# Patient Record
Sex: Male | Born: 1955
Health system: Southern US, Community
[De-identification: ages and names within clinical notes are randomized; demographics above are authoritative.]

## PROBLEM LIST (undated history)

## (undated) DIAGNOSIS — I639 Cerebral infarction, unspecified: Secondary | ICD-10-CM

## (undated) DIAGNOSIS — M1990 Unspecified osteoarthritis, unspecified site: Secondary | ICD-10-CM

## (undated) DIAGNOSIS — I69398 Other sequelae of cerebral infarction: Secondary | ICD-10-CM

## (undated) DIAGNOSIS — E785 Hyperlipidemia, unspecified: Secondary | ICD-10-CM

## (undated) DIAGNOSIS — M109 Gout, unspecified: Secondary | ICD-10-CM

## (undated) DIAGNOSIS — I10 Essential (primary) hypertension: Secondary | ICD-10-CM

## (undated) DIAGNOSIS — I63411 Cerebral infarction due to embolism of right middle cerebral artery: Secondary | ICD-10-CM

## (undated) DIAGNOSIS — I480 Paroxysmal atrial fibrillation: Secondary | ICD-10-CM

## (undated) DIAGNOSIS — N179 Acute kidney failure, unspecified: Secondary | ICD-10-CM

## (undated) DIAGNOSIS — F1094 Alcohol use, unspecified with alcohol-induced mood disorder: Secondary | ICD-10-CM

## (undated) DIAGNOSIS — R209 Unspecified disturbances of skin sensation: Secondary | ICD-10-CM

## (undated) DIAGNOSIS — Q242 Cor triatriatum: Secondary | ICD-10-CM

## (undated) DIAGNOSIS — K635 Polyp of colon: Secondary | ICD-10-CM

## (undated) HISTORY — DX: Hyperlipidemia, unspecified: E78.5

## (undated) HISTORY — DX: Cerebral infarction due to embolism of right middle cerebral artery: I63.411

## (undated) HISTORY — DX: Unspecified disturbances of skin sensation: R20.9

## (undated) HISTORY — DX: Cor triatriatum: Q24.2

## (undated) HISTORY — DX: Paroxysmal atrial fibrillation: I48.0

## (undated) HISTORY — DX: Unspecified osteoarthritis, unspecified site: M19.90

## (undated) HISTORY — PX: OTHER SURGICAL HISTORY: SHX169

## (undated) HISTORY — DX: Alcohol use, unspecified with alcohol-induced mood disorder: F10.94

## (undated) HISTORY — DX: Essential (primary) hypertension: I10

## (undated) HISTORY — DX: Acute kidney failure, unspecified: N17.9

## (undated) HISTORY — DX: Other sequelae of cerebral infarction: I69.398

## (undated) HISTORY — DX: Cerebral infarction, unspecified: I63.9

## (undated) SURGERY — EGD (ESOPHAGOGASTRODUODENOSCOPY)
Anesthesia: Moderate Sedation | Laterality: Left

---

## 2009-02-16 DIAGNOSIS — K635 Polyp of colon: Secondary | ICD-10-CM

## 2009-02-16 HISTORY — DX: Polyp of colon: K63.5

## 2009-02-16 HISTORY — PX: COLONOSCOPY: SHX174

## 2012-10-10 ENCOUNTER — Ambulatory Visit (INDEPENDENT_AMBULATORY_CARE_PROVIDER_SITE_OTHER): Payer: BC Managed Care – PPO | Admitting: Emergency Medicine

## 2012-10-10 ENCOUNTER — Telehealth: Payer: Self-pay | Admitting: Radiology

## 2012-10-10 VITALS — BP 178/92 | HR 77 | Temp 98.0°F | Resp 16 | Ht 66.25 in | Wt 180.6 lb

## 2012-10-10 DIAGNOSIS — Z0289 Encounter for other administrative examinations: Secondary | ICD-10-CM

## 2012-10-10 DIAGNOSIS — Z0000 Encounter for general adult medical examination without abnormal findings: Secondary | ICD-10-CM

## 2012-10-10 LAB — POCT UA - MICROSCOPIC ONLY
Casts, Ur, LPF, POC: NEGATIVE
Mucus, UA: NEGATIVE
WBC, Ur, HPF, POC: NEGATIVE
Yeast, UA: NEGATIVE

## 2012-10-10 LAB — LIPID PANEL
Cholesterol: 283 mg/dL — ABNORMAL HIGH (ref 0–200)
Triglycerides: 235 mg/dL — ABNORMAL HIGH (ref <150)
VLDL: 47 mg/dL — ABNORMAL HIGH (ref 0–40)

## 2012-10-10 LAB — POCT URINALYSIS DIPSTICK
Bilirubin, UA: NEGATIVE
Glucose, UA: NEGATIVE
Leukocytes, UA: NEGATIVE
Nitrite, UA: NEGATIVE
Urobilinogen, UA: 0.2

## 2012-10-10 LAB — PSA: PSA: 0.44 ng/mL (ref <=4.00)

## 2012-10-10 LAB — POCT CBC
Granulocyte percent: 68.7 %G (ref 37–80)
HCT, POC: 50.1 % (ref 43.5–53.7)
MPV: 7 fL (ref 0–99.8)
POC Granulocyte: 7 — AB (ref 2–6.9)
POC LYMPH PERCENT: 24.5 %L (ref 10–50)
POC MID %: 6.8 %M (ref 0–12)
Platelet Count, POC: 303 10*3/uL (ref 142–424)
RDW, POC: 13.2 %

## 2012-10-10 LAB — COMPREHENSIVE METABOLIC PANEL
CO2: 26 mEq/L (ref 19–32)
Calcium: 9.8 mg/dL (ref 8.4–10.5)
Creat: 0.73 mg/dL (ref 0.50–1.35)
Glucose, Bld: 101 mg/dL — ABNORMAL HIGH (ref 70–99)
Sodium: 135 mEq/L (ref 135–145)
Total Bilirubin: 0.6 mg/dL (ref 0.3–1.2)
Total Protein: 8 g/dL (ref 6.0–8.3)

## 2012-10-10 MED ORDER — LISINOPRIL 20 MG PO TABS: 20.0000 mg | ORAL_TABLET | Freq: Every day | ORAL | Status: DC

## 2012-10-10 MED ORDER — LOSARTAN POTASSIUM-HCTZ 50-12.5 MG PO TABS: 1.0000 | ORAL_TABLET | Freq: Every day | ORAL | Status: DC

## 2012-10-10 NOTE — Progress Notes (Signed)
[facility] [address], [place] [address] [phone]  Date:  10/10/2012   Name:  [name]   DOB:  [birth year]   MRN:  [id number]  PCP:  No PCP Per Patient    Chief Complaint: admin physical   History of Present Illness:  [name] is a 57 y.o. very pleasant male patient who presents with the following:  For wellness examination for insurance to verify BP and labs.  There are no active problems to display for this patient.   Past Medical History  Diagnosis Date  . Arthritis   . Hypertension     History reviewed. No pertinent past surgical history.  History  Substance Use Topics  . Smoking status: Current Every Day Smoker -- 0.50 packs/day for 40 years    Types: Cigarettes  . Smokeless tobacco: Not on file  . Alcohol Use: 10.8 oz/week    18 Cans of beer per week    Family History  Problem Relation Age of Onset  . Cancer Father     No Known Allergies  Medication list has been reviewed and updated.  No current outpatient prescriptions on file prior to visit.   No current facility-administered medications on file prior to visit.    Review of Systems:  As per HPI, otherwise negative.    Physical Examination: Filed Vitals:   10/10/12 0938  BP: 178/92  Pulse: 77  Temp: 98 F (36.7 C)  Resp: 16   Filed Vitals:   10/10/12 0938  Height: 5' 6.25" (1.683 m)  Weight: 180 lb 9.6 oz (81.92 kg)   Body mass index is 28.92 kg/(m^2). Ideal Body Weight: Weight in (lb) to have BMI = 25: 155.7  GEN: WDWN, NAD, Non-toxic, A & O x 3 HEENT: Atraumatic, Normocephalic. Neck supple. No masses, No LAD. Ears and Nose: No external deformity. CV: RRR, No M/G/R. No JVD. No thrill. No extra heart sounds. PULM: CTA B, no wheezes, crackles, rhonchi. No retractions. No resp. distress. No accessory muscle use. ABD: S, NT, ND, +BS. No rebound. No HSM. EXTR: No c/c/e NEURO Normal gait.  PSYCH: Normally interactive. Conversant. Not depressed or  anxious appearing.  Calm demeanor.    Assessment and Plan: Hypertension Labs   Signed,  Phillips Odor, MD

## 2012-10-10 NOTE — Telephone Encounter (Signed)
Done

## 2012-10-10 NOTE — Telephone Encounter (Signed)
Please change him to lisinopril 20 mg daily.  Follow up in one month for BP check  Thanks

## 2012-10-10 NOTE — Telephone Encounter (Signed)
Hyzaar not covered by insurance unless patient has tried and failed Ace inhibitor, please advise, do you want [place] to do a prior auth for the Hyzaar? Or do you want to change the medication to a generic ace inhibitor which is covered by the insurance carrier?

## 2012-10-11 MED ORDER — ATORVASTATIN CALCIUM 20 MG PO TABS: 20.0000 mg | ORAL_TABLET | Freq: Every day | ORAL | Status: DC

## 2012-10-11 NOTE — Progress Notes (Signed)
  Subjective:    Patient ID: [name], male    DOB: 1955/05/06, 57 y.o.   MRN: [id number]  HPI    Review of Systems     Objective:   Physical Exam        Assessment & Plan:

## 2012-10-11 NOTE — Addendum Note (Signed)
Addended by: Carmelina Dane on: 10/11/2012 08:44 AM   Modules accepted: Orders

## 2012-10-13 ENCOUNTER — Telehealth: Payer: Self-pay

## 2012-10-13 NOTE — Telephone Encounter (Signed)
pts wife called and states we should call the patient on his cell phone with any lab results pts cell number is [phone]

## 2013-05-21 ENCOUNTER — Emergency Department (HOSPITAL_COMMUNITY)
Admission: EM | Admit: 2013-05-21 | Discharge: 2013-05-21 | Disposition: A | Discharge status: Home / Self Care | Payer: BC Managed Care – PPO | Attending: Emergency Medicine | Admitting: Emergency Medicine

## 2013-05-21 ENCOUNTER — Encounter (HOSPITAL_COMMUNITY): Payer: Self-pay | Admitting: Emergency Medicine

## 2013-05-21 ENCOUNTER — Emergency Department (HOSPITAL_COMMUNITY): Payer: BC Managed Care – PPO

## 2013-05-21 DIAGNOSIS — Z79899 Other long term (current) drug therapy: Secondary | ICD-10-CM | POA: Insufficient documentation

## 2013-05-21 DIAGNOSIS — M542 Cervicalgia: Secondary | ICD-10-CM | POA: Insufficient documentation

## 2013-05-21 DIAGNOSIS — Z8739 Personal history of other diseases of the musculoskeletal system and connective tissue: Secondary | ICD-10-CM | POA: Insufficient documentation

## 2013-05-21 DIAGNOSIS — J029 Acute pharyngitis, unspecified: Secondary | ICD-10-CM | POA: Insufficient documentation

## 2013-05-21 DIAGNOSIS — F17200 Nicotine dependence, unspecified, uncomplicated: Secondary | ICD-10-CM | POA: Insufficient documentation

## 2013-05-21 DIAGNOSIS — I10 Essential (primary) hypertension: Secondary | ICD-10-CM | POA: Insufficient documentation

## 2013-05-21 LAB — CBC WITH DIFFERENTIAL/PLATELET
Basophils Absolute: 0 K/uL (ref 0.0–0.1)
Basophils Relative: 0 % (ref 0–1)
Eosinophils Absolute: 0.1 K/uL (ref 0.0–0.7)
Eosinophils Relative: 1 % (ref 0–5)
HCT: 41.9 % (ref 39.0–52.0)
Hemoglobin: 15.1 g/dL (ref 13.0–17.0)
Lymphocytes Relative: 21 % (ref 12–46)
Lymphs Abs: 2.4 K/uL (ref 0.7–4.0)
MCH: 33.3 pg (ref 26.0–34.0)
MCHC: 36 g/dL (ref 30.0–36.0)
MCV: 92.5 fL (ref 78.0–100.0)
Monocytes Absolute: 1.4 K/uL — ABNORMAL HIGH (ref 0.1–1.0)
Monocytes Relative: 12 % (ref 3–12)
Neutro Abs: 7.5 K/uL (ref 1.7–7.7)
Neutrophils Relative %: 66 % (ref 43–77)
Platelets: 225 K/uL (ref 150–400)
RBC: 4.53 MIL/uL (ref 4.22–5.81)
RDW: 12.6 % (ref 11.5–15.5)
WBC: 11.4 K/uL — ABNORMAL HIGH (ref 4.0–10.5)

## 2013-05-21 LAB — BASIC METABOLIC PANEL WITH GFR
BUN: 14 mg/dL (ref 6–23)
CO2: 24 meq/L (ref 19–32)
Calcium: 9.4 mg/dL (ref 8.4–10.5)
Chloride: 94 meq/L — ABNORMAL LOW (ref 96–112)
Creatinine, Ser: 0.64 mg/dL (ref 0.50–1.35)
GFR calc Af Amer: >90 mL/min
GFR calc non Af Amer: >90 mL/min
Glucose, Bld: 95 mg/dL (ref 70–99)
Potassium: 4.4 meq/L (ref 3.7–5.3)
Sodium: 133 meq/L — ABNORMAL LOW (ref 137–147)

## 2013-05-21 MED ORDER — IBUPROFEN 600 MG PO TABS: 600.0000 mg | ORAL_TABLET | Freq: Four times a day (QID) | ORAL | Status: DC | PRN

## 2013-05-21 MED ORDER — CYCLOBENZAPRINE HCL 10 MG PO TABS: 10.0000 mg | ORAL_TABLET | Freq: Two times a day (BID) | ORAL | Status: DC | PRN

## 2013-05-21 MED ORDER — KETOROLAC TROMETHAMINE 30 MG/ML IJ SOLN
30.0000 mg | Freq: Once | INTRAMUSCULAR | Status: AC
  Administered 2013-05-21: 30 mg via INTRAVENOUS
  Filled 2013-05-21: qty 1

## 2013-05-21 MED ORDER — OXYCODONE-ACETAMINOPHEN 5-325 MG PO TABS
1.0000 | ORAL_TABLET | ORAL | Status: DC | PRN
Start: 2013-05-21 — End: 2014-01-05

## 2013-05-21 MED ORDER — DIAZEPAM 5 MG/ML IJ SOLN
5.0000 mg | Freq: Once | INTRAMUSCULAR | Status: AC
  Administered 2013-05-21: 5 mg via INTRAVENOUS
  Filled 2013-05-21: qty 2

## 2013-05-21 MED ORDER — SODIUM CHLORIDE 0.9 % IV BOLUS (SEPSIS)
1000.0000 mL | Freq: Once | INTRAVENOUS | Status: AC
  Administered 2013-05-21: 1000 mL via INTRAVENOUS

## 2013-05-21 MED ORDER — IOHEXOL 300 MG/ML  SOLN
80.0000 mL | Freq: Once | INTRAMUSCULAR | Status: AC | PRN
  Administered 2013-05-21: 80 mL via INTRAVENOUS

## 2013-05-21 NOTE — ED Notes (Addendum)
Reports onset of neck pain, difficulty turning his head since Friday night. Denies injury to neck. Reports also having sore throat but is able to swallow. Airway intact at triage. Pt is hypertensive at triage, reports not taking bp meds for several days.

## 2013-05-21 NOTE — ED Provider Notes (Signed)
CSN: [id number]     Arrival date & time 05/21/13  1815 History   First MD Initiated Contact with Patient 05/21/13 2027     Chief Complaint  Patient presents with  . Neck Pain  . Sore Throat    HPI 58 yo M presents complaining of neck pain. Onset 2 days ago.  No known injury.  Gradual in onset.   Moderate in severity.  Gradually worsening.   Cramping, aching in nature.  Located to left posterolateral and right posterolateral neck.  Does not radiate.   He has trouble turning his neck side to side due to pain. He has mild sore throat.  No associated chest pain, nausea, vomiting, shortness of breath, diaphoresis, fever, chills, altered mental status, headache or other associated symptoms.  He has never had neck surgery or any chronic neck pain or neck injury.   Past Medical History  Diagnosis Date  . Arthritis   . Hypertension    History reviewed. No pertinent past surgical history. Family History  Problem Relation Age of Onset  . Cancer Father    History  Substance Use Topics  . Smoking status: Current Every Day Smoker -- 0.50 packs/day for 40 years    Types: Cigarettes  . Smokeless tobacco: Not on file  . Alcohol Use: 10.8 oz/week    18 Cans of beer per week    Review of Systems  Constitutional: Negative for fever and chills.  HENT: Positive for sore throat. Negative for congestion, dental problem, drooling, rhinorrhea, trouble swallowing and voice change.   Eyes: Negative for visual disturbance.  Respiratory: Negative for cough and shortness of breath.   Cardiovascular: Negative for chest pain and leg swelling.  Gastrointestinal: Negative for nausea, vomiting, abdominal pain and diarrhea.  Genitourinary: Negative for dysuria, urgency, frequency, flank pain and difficulty urinating.  Musculoskeletal: Positive for neck pain and neck stiffness. Negative for back pain.  Skin: Negative for rash.  Neurological: Negative for syncope, weakness, numbness and headaches.  All  other systems reviewed and are negative.     Allergies  Review of patient's allergies indicates no known allergies.  Home Medications   Current Outpatient Rx  Name  Route  Sig  Dispense  Refill  . guaifenesin (ROBITUSSIN) 100 MG/5ML syrup   Oral   Take 300 mg by mouth 3 (three) times daily as needed for cough.         Marland Kitchen ibuprofen (ADVIL,MOTRIN) 200 MG tablet   Oral   Take 400 mg by mouth every 6 (six) hours as needed for moderate pain.         . Menthol, Topical Analgesic, (PAIN RELIEVING PATCH ULTRA ST EX)   Apply externally   Apply 1 patch topically daily as needed (for pain).         Marland Kitchen atorvastatin (LIPITOR) 20 MG tablet   Oral   Take 1 tablet (20 mg total) by mouth daily.   90 tablet   1   . cyclobenzaprine (FLEXERIL) 10 MG tablet   Oral   Take 1 tablet (10 mg total) by mouth 2 (two) times daily as needed for muscle spasms.   30 tablet   0   . ibuprofen (ADVIL,MOTRIN) 600 MG tablet   Oral   Take 1 tablet (600 mg total) by mouth every 6 (six) hours as needed.   30 tablet   0   . lisinopril (PRINIVIL,ZESTRIL) 20 MG tablet   Oral   Take 1 tablet (20 mg total) by mouth daily.  30 tablet   3   . oxyCODONE-acetaminophen (PERCOCET/ROXICET) 5-325 MG per tablet   Oral   Take 1-2 tablets by mouth every 4 (four) hours as needed for severe pain.   15 tablet   0    BP 159/81  Pulse 71  Temp(Src) 98.3 F (36.8 C) (Oral)  Resp 20  Wt 180 lb (81.647 kg)  SpO2 96% Physical Exam  Nursing note and vitals reviewed. Constitutional: He is oriented to person, place, and time. He appears well-developed and well-nourished. No distress.  HENT:  Head: Normocephalic and atraumatic.  Mouth/Throat: Oropharynx is clear and moist.  Uvula midline; no pharyngeal abscess or mass; oropharynx normal to inspection.  No trismus.  Eyes: Conjunctivae and EOM are normal. Pupils are equal, round, and reactive to light. No scleral icterus.  Neck: Neck supple. No JVD present.  No  adenopathy.  Patient complains of worse pain with rotation of neck sided to side.  He is able to fully flex and extend his neck.  Tenderness to palpation over posterolateral soft tissues bilaterally.  No midline tenderness.  No tracheal tenderness.  Negative Kernig and Brudzinski.  Cardiovascular: Normal rate, regular rhythm, normal heart sounds and intact distal pulses.  Exam reveals no gallop and no friction rub.   No murmur heard. Pulmonary/Chest: Effort normal and breath sounds normal. No stridor. No respiratory distress. He has no wheezes. He has no rales.  Abdominal: Soft. Bowel sounds are normal. He exhibits no distension. There is no tenderness. There is no rebound and no guarding.  Musculoskeletal: He exhibits no edema.  Neurological: He is alert and oriented to person, place, and time. He exhibits normal muscle tone. Coordination normal.  Alert, oriented X 4, GCS 15.  CN 2-12 intact.  5/5 strength in bilateral upper and lower extremities, all major muscle groups.  Normal sensation to light touch in bilateral upper and lower extremities.  Visual fields intact to confrontation.  Normal gait.  Negative Romberg.  Normal coordination.  Normal finger to nose and heel to shin  Skin: Skin is warm and dry. No rash noted. He is not diaphoretic.  Psychiatric: He has a normal mood and affect.    ED Course  Procedures (including critical care time) Labs Review Labs Reviewed  CBC WITH DIFFERENTIAL - Abnormal; Notable for the following:    WBC 11.4 (*)    Monocytes Absolute 1.4 (*)    All other components within normal limits  BASIC METABOLIC PANEL - Abnormal; Notable for the following:    Sodium 133 (*)    Chloride 94 (*)    All other components within normal limits   Imaging: EXAM: CT NECK WITH CONTRAST IMPRESSION: No acute findings.   Moderate calcification / ossification within the prevertebral soft tissues at the C2 level without significant impression on adjacent airway.   Minimal  paraseptal in synapse disease over the lung apices with left apical pleural thickening. There is a small nodular component to the pleural thickening. Recommend follow-up noncontrast chest CT in 3 months.   EKG Interpretation None      MDM   58 year old male with history of alcohol abuse presents complaining of neck pain. Onset was about 2 days ago. No known injury. He states that he can't turn his head left or right. It feels like a severe muscle cramp. He is having some mild sore throat and difficulty swallowing.  Afebrile with hypertension and otherwise normal vitals. Nontoxic appearing. Normal neurologic exam. Range of motion at the neck with  rotation right and left limited secondary to pain. He is able to flex his neck and touch his chin to his chest. He has tenderness to palpation over posterior lateral neck musculature bilaterally. Remainder of his exam is unremarkable.  His presentation is most consistent with severe neck strain. However given his mild reported difficulty swallowing I obtained a CT scan of his neck with contrast to evaluate for retropharyngeal abscess or other deep space infection. The scan is negative. His epiglottis was normal the scan. Part of the scan is treated with a dose of IV Valium and on reevaluation he reports some improvement in pain and range of motion. At this point I do not suspect an occult infectious process. He has no headache, fever, altered metal status, or any other signs or symptoms suggestive of meningitis. Is appropriate for discharge with outpatient management. He is given a prescription for a few days worth of Percocet as well as Flexeril. He is advised against mixing these medications with alcohol. He is to follow with his primary care physician for reevaluation in 2-3 days if not improving. He's also to take NSAIDs.  Final diagnoses:  Neck pain       Toney Sang, MD 05/24/13 1318

## 2013-05-21 NOTE — ED Provider Notes (Signed)
I saw and evaluated the patient, reviewed the resident's note and I agree with the findings and plan.   EKG Interpretation None      Pt is a 58 y.o. M with history of hypertension, arthritis, alcohol abuse who presents emergency department with pain with moving his neck to the right or left that started 2 nights ago. He is also having pain with swallowing. Denies any fever. Denies any difficulty breathing. Denies any numbness, tingling or focal weakness. No bowel or bladder incontinence. No trismus or drooling. On exam, patient is status post tonsillectomy. His oropharynx is not erythematous and there is no lesions. No cervical lymphadenopathy. No meningismus the patient does have pain with turning his head to the right or left. He is neurologically intact with normal gait. Patient's labs are unremarkable other than mild leukocytosis which may be reactive. His repeat temperature is 98.1. He has no pain with flexion or extension of his neck. His CT scan shows no abnormality. His airway is patent. There is no sign of abscess or adenopathy. Suspect this is muscle spasm. We'll discharge patient with pain medication and muscle relaxers. Have given return precautions.  Layla MawKristen N Ward, DO 05/21/13 2303

## 2013-05-21 NOTE — Discharge Instructions (Signed)
Cervical Sprain A cervical sprain is an injury in the neck in which the strong, fibrous tissues (ligaments) that connect your neck bones stretch or tear. Cervical sprains can range from mild to severe. Severe cervical sprains can cause the neck vertebrae to be unstable. This can lead to damage of the spinal cord and can result in serious nervous system problems. The amount of time it takes for a cervical sprain to get better depends on the cause and extent of the injury. Most cervical sprains heal in 1 to 3 weeks. CAUSES  Severe cervical sprains may be caused by:   Contact sport injuries (such as from football, rugby, wrestling, hockey, auto racing, gymnastics, diving, martial arts, or boxing).   Motor vehicle collisions.   Whiplash injuries. This is an injury from a sudden forward-and backward whipping movement of the head and neck.  Falls.  Mild cervical sprains may be caused by:   Being in an awkward position, such as while cradling a telephone between your ear and shoulder.   Sitting in a chair that does not offer proper support.   Working at a poorly designed computer station.   Looking up or down for long periods of time.  SYMPTOMS   Pain, soreness, stiffness, or a burning sensation in the front, back, or sides of the neck. This discomfort may develop immediately after the injury or slowly, 24 hours or more after the injury.   Pain or tenderness directly in the middle of the back of the neck.   Shoulder or upper back pain.   Limited ability to move the neck.   Headache.   Dizziness.   Weakness, numbness, or tingling in the hands or arms.   Muscle spasms.   Difficulty swallowing or chewing.   Tenderness and swelling of the neck.  DIAGNOSIS  Most of the time your health care provider can diagnose a cervical sprain by taking your history and doing a physical exam. Your health care provider will ask about previous neck injuries and any known neck  problems, such as arthritis in the neck. X-rays may be taken to find out if there are any other problems, such as with the bones of the neck. Other tests, such as a CT scan or MRI, may also be needed.  TREATMENT  Treatment depends on the severity of the cervical sprain. Mild sprains can be treated with rest, keeping the neck in place (immobilization), and pain medicines. Severe cervical sprains are immediately immobilized. Further treatment is done to help with pain, muscle spasms, and other symptoms and may include:  Medicines, such as pain relievers, numbing medicines, or muscle relaxants.   Physical therapy. This may involve stretching exercises, strengthening exercises, and posture training. Exercises and improved posture can help stabilize the neck, strengthen muscles, and help stop symptoms from returning.  HOME CARE INSTRUCTIONS   Put ice on the injured area.   Put ice in a plastic bag.   Place a towel between your skin and the bag.   Leave the ice on for 15 20 minutes, 3 4 times a day.   If your injury was severe, you may have been given a cervical collar to wear. A cervical collar is a two-piece collar designed to keep your neck from moving while it heals.  Do not remove the collar unless instructed by your health care provider.  If you have long hair, keep it outside of the collar.  Ask your health care provider before making any adjustments to your collar.   Minor adjustments may be required over time to improve comfort and reduce pressure on your chin or on the back of your head.  Ifyou are allowed to remove the collar for cleaning or bathing, follow your health care provider's instructions on how to do so safely.  Keep your collar clean by wiping it with mild soap and water and drying it completely. If the collar you have been given includes removable pads, remove them every 1 2 days and hand wash them with soap and water. Allow them to air dry. They should be completely  dry before you wear them in the collar.  If you are allowed to remove the collar for cleaning and bathing, wash and dry the skin of your neck. Check your skin for irritation or sores. If you see any, tell your health care provider.  Do not drive while wearing the collar.   Only take over-the-counter or prescription medicines for pain, discomfort, or fever as directed by your health care provider.   Keep all follow-up appointments as directed by your health care provider.   Keep all physical therapy appointments as directed by your health care provider.   Make any needed adjustments to your workstation to promote good posture.   Avoid positions and activities that make your symptoms worse.   Warm up and stretch before being active to help prevent problems.  SEEK MEDICAL CARE IF:   Your pain is not controlled with medicine.   You are unable to decrease your pain medicine over time as planned.   Your activity level is not improving as expected.  SEEK IMMEDIATE MEDICAL CARE IF:   You develop any bleeding.  You develop stomach upset.  You have signs of an allergic reaction to your medicine.   Your symptoms get worse.   You develop new, unexplained symptoms.   You have numbness, tingling, weakness, or paralysis in any part of your body.  MAKE SURE YOU:   Understand these instructions.  Will watch your condition.  Will get help right away if you are not doing well or get worse. Document Released: 11/30/2006 Document Revised: 11/23/2012 Document Reviewed: 08/10/2012 [employer] Patient Information 2014 [employer].  

## 2013-05-24 ENCOUNTER — Telehealth (HOSPITAL_BASED_OUTPATIENT_CLINIC_OR_DEPARTMENT_OTHER): Payer: Self-pay | Admitting: Emergency Medicine

## 2013-10-10 ENCOUNTER — Other Ambulatory Visit: Payer: Self-pay | Admitting: Emergency Medicine

## 2013-10-11 ENCOUNTER — Ambulatory Visit (INDEPENDENT_AMBULATORY_CARE_PROVIDER_SITE_OTHER): Payer: BC Managed Care – PPO | Admitting: Emergency Medicine

## 2013-10-11 VITALS — BP 142/84 | HR 85 | Temp 97.7°F | Resp 18 | Ht 65.5 in | Wt 177.2 lb

## 2013-10-11 DIAGNOSIS — E782 Mixed hyperlipidemia: Secondary | ICD-10-CM

## 2013-10-11 DIAGNOSIS — I10 Essential (primary) hypertension: Secondary | ICD-10-CM | POA: Insufficient documentation

## 2013-10-11 DIAGNOSIS — Z0000 Encounter for general adult medical examination without abnormal findings: Secondary | ICD-10-CM

## 2013-10-11 LAB — POCT CBC
GRANULOCYTE PERCENT: 63.6 % (ref 37–80)
HEMATOCRIT: 48 % (ref 43.5–53.7)
Hemoglobin: 15.5 g/dL (ref 14.1–18.1)
Lymph, poc: 3.1 (ref 0.6–3.4)
MCH, POC: 31.5 pg — AB (ref 27–31.2)
MCHC: 32.3 g/dL (ref 31.8–35.4)
MCV: 97.4 fL — AB (ref 80–97)
MID (cbc): 0.3 (ref 0–0.9)
MPV: 6.3 fL (ref 0–99.8)
POC GRANULOCYTE: 5.9 (ref 2–6.9)
POC LYMPH PERCENT: 33.7 %L (ref 10–50)
POC MID %: 2.7 %M (ref 0–12)
Platelet Count, POC: 281 10*3/uL (ref 142–424)
RBC: 4.93 M/uL (ref 4.69–6.13)
RDW, POC: 13.2 %
WBC: 9.3 10*3/uL (ref 4.6–10.2)

## 2013-10-11 MED ORDER — ATORVASTATIN CALCIUM 40 MG PO TABS: 40.0000 mg | ORAL_TABLET | Freq: Every day | ORAL | Status: DC

## 2013-10-11 MED ORDER — LISINOPRIL 20 MG PO TABS: 20.0000 mg | ORAL_TABLET | Freq: Every day | ORAL | Status: DC

## 2013-10-11 NOTE — Patient Instructions (Signed)

## 2013-10-11 NOTE — Progress Notes (Signed)
[facility] [address], [place] [address] [phone]  Date:  10/11/2013   Name:  [name]   DOB:  [birth year]   MRN:  [id number]  PCP:  No PCP Per Patient    Chief Complaint: Annual Exam   History of Present Illness:  [name] is a 58 y.o. very pleasant male patient who presents with the following:  Put on lipitor and lisinopril a year ago.  Never picked up the lipitor and stopped the lisinopril until a week ago.  No end organ issues Smokes No improvement with over the counter medications or other home remedies. Denies other complaint or health concern today.    Patient Active Problem List   Diagnosis Date Noted  . Essential hypertension, benign 10/11/2013  . Mixed hyperlipidemia 10/11/2013    Past Medical History  Diagnosis Date  . Arthritis   . Hypertension   . Hyperlipidemia     History reviewed. No pertinent past surgical history.  History  Substance Use Topics  . Smoking status: Current Every Day Smoker -- 0.50 packs/day for 40 years    Types: Cigarettes  . Smokeless tobacco: Not on file  . Alcohol Use: 10.8 oz/week    18 Cans of beer per week    Family History  Problem Relation Age of Onset  . Cancer Father     No Known Allergies  Medication list has been reviewed and updated.  Current Outpatient Prescriptions on File Prior to Visit  Medication Sig Dispense Refill  . atorvastatin (LIPITOR) 20 MG tablet Take 1 tablet (20 mg total) by mouth daily.  90 tablet  1  . lisinopril (PRINIVIL,ZESTRIL) 20 MG tablet Take 1 tablet (20 mg total) by mouth daily.  30 tablet  3  . cyclobenzaprine (FLEXERIL) 10 MG tablet Take 1 tablet (10 mg total) by mouth 2 (two) times daily as needed for muscle spasms.  30 tablet  0  . guaifenesin (ROBITUSSIN) 100 MG/5ML syrup Take 300 mg by mouth 3 (three) times daily as needed for cough.      Marland Kitchen ibuprofen (ADVIL,MOTRIN) 200 MG tablet Take 400 mg by mouth every 6 (six) hours as needed for moderate  pain.      Marland Kitchen ibuprofen (ADVIL,MOTRIN) 600 MG tablet Take 1 tablet (600 mg total) by mouth every 6 (six) hours as needed.  30 tablet  0  . Menthol, Topical Analgesic, (PAIN RELIEVING PATCH ULTRA ST EX) Apply 1 patch topically daily as needed (for pain).      Marland Kitchen oxyCODONE-acetaminophen (PERCOCET/ROXICET) 5-325 MG per tablet Take 1-2 tablets by mouth every 4 (four) hours as needed for severe pain.  15 tablet  0   No current facility-administered medications on file prior to visit.    Review of Systems:  As per HPI, otherwise negative.    Physical Examination: Filed Vitals:   10/11/13 1600  BP: 142/84  Pulse: 85  Temp: 97.7 F (36.5 C)  Resp: 18   Filed Vitals:   10/11/13 1600  Height: 5' 5.5" (1.664 m)  Weight: 177 lb 3.2 oz (80.377 kg)   Body mass index is 29.03 kg/(m^2). Ideal Body Weight: Weight in (lb) to have BMI = 25: 152.2  GEN: WDWN, NAD, Non-toxic, A & O x 3 HEENT: Atraumatic, Normocephalic. Neck supple. No masses, No LAD. Ears and Nose: No external deformity. CV: RRR, No M/G/R. No JVD. No thrill. No extra heart sounds. PULM: CTA B, no wheezes, crackles, rhonchi. No retractions. No resp. distress.  No accessory muscle use. ABD: S, NT, ND, +BS. No rebound. No HSM. EXTR: No c/c/e NEURO Normal gait.  PSYCH: Normally interactive. Conversant. Not depressed or anxious appearing.  Calm demeanor.    Assessment and Plan: Wellness examination Hypertension Hyperlipidemia Non compliance Labs pending.   Signed,  Phillips Odor, MD

## 2013-10-12 LAB — LIPID PANEL
Cholesterol: 272 mg/dL — ABNORMAL HIGH (ref 0–200)
HDL: 43 mg/dL (ref >39)
LDL CALC: 192 mg/dL — AB (ref 0–99)
Total CHOL/HDL Ratio: 6.3 Ratio
Triglycerides: 183 mg/dL — ABNORMAL HIGH (ref <150)
VLDL: 37 mg/dL (ref 0–40)

## 2013-10-12 LAB — COMPREHENSIVE METABOLIC PANEL
ALT: 35 U/L (ref 0–53)
AST: 29 U/L (ref 0–37)
Albumin: 4.4 g/dL (ref 3.5–5.2)
Alkaline Phosphatase: 69 U/L (ref 39–117)
BILIRUBIN TOTAL: 0.8 mg/dL (ref 0.2–1.2)
BUN: 12 mg/dL (ref 6–23)
CO2: 23 meq/L (ref 19–32)
Calcium: 9.7 mg/dL (ref 8.4–10.5)
Chloride: 101 mEq/L (ref 96–112)
Creat: 0.75 mg/dL (ref 0.50–1.35)
Glucose, Bld: 91 mg/dL (ref 70–99)
Potassium: 4.3 mEq/L (ref 3.5–5.3)
SODIUM: 134 meq/L — AB (ref 135–145)
TOTAL PROTEIN: 8.3 g/dL (ref 6.0–8.3)

## 2013-10-13 LAB — PSA: PSA: 0.55 ng/mL (ref <=4.00)

## 2013-10-13 NOTE — Progress Notes (Signed)
   Subjective:    Patient ID: [name], male    DOB: 1955-08-03, 58 y.o.   MRN: [id number]  HPI    Review of Systems     Objective:   Physical Exam        Assessment & Plan:

## 2013-12-07 ENCOUNTER — Other Ambulatory Visit: Payer: Self-pay | Admitting: Emergency Medicine

## 2014-01-05 ENCOUNTER — Ambulatory Visit (INDEPENDENT_AMBULATORY_CARE_PROVIDER_SITE_OTHER): Payer: BC Managed Care – PPO | Admitting: Family Medicine

## 2014-01-05 ENCOUNTER — Encounter: Payer: Self-pay | Admitting: Family Medicine

## 2014-01-05 VITALS — BP 150/84 | HR 80 | Temp 97.6°F | Resp 16 | Ht 66.5 in | Wt 178.2 lb

## 2014-01-05 DIAGNOSIS — Z23 Encounter for immunization: Secondary | ICD-10-CM

## 2014-01-05 DIAGNOSIS — E782 Mixed hyperlipidemia: Secondary | ICD-10-CM

## 2014-01-05 DIAGNOSIS — I10 Essential (primary) hypertension: Secondary | ICD-10-CM

## 2014-01-05 DIAGNOSIS — Z1159 Encounter for screening for other viral diseases: Secondary | ICD-10-CM

## 2014-01-05 LAB — LIPID PANEL
CHOLESTEROL: 212 mg/dL — AB (ref 0–200)
HDL: 41 mg/dL (ref >39)
LDL Cholesterol: 142 mg/dL — ABNORMAL HIGH (ref 0–99)
TRIGLYCERIDES: 143 mg/dL (ref <150)
Total CHOL/HDL Ratio: 5.2 Ratio
VLDL: 29 mg/dL (ref 0–40)

## 2014-01-05 LAB — ALT: ALT: 43 U/L (ref 0–53)

## 2014-01-05 MED ORDER — LISINOPRIL 20 MG PO TABS: 20.0000 mg | ORAL_TABLET | Freq: Every day | ORAL | Status: DC

## 2014-01-05 NOTE — Patient Instructions (Signed)
Hypertension Hypertension is another name for high blood pressure. High blood pressure forces your heart to work harder to pump blood. A blood pressure reading has two numbers, which includes a higher number over a lower number (example: 110/72). HOME CARE   Have your blood pressure rechecked by your doctor.  Only take medicine as told by your doctor. Follow the directions carefully. The medicine does not work as well if you skip doses. Skipping doses also puts you at risk for problems.  Do not smoke.  Monitor your blood pressure at home as told by your doctor. GET HELP IF:  You think you are having a reaction to the medicine you are taking.  You have repeat headaches or feel dizzy.  You have puffiness (swelling) in your ankles.  You have trouble with your vision. GET HELP RIGHT AWAY IF:   You get a very bad headache and are confused.  You feel weak, numb, or faint.  You get chest or belly (abdominal) pain.  You throw up (vomit).  You cannot breathe very well. MAKE SURE YOU:   Understand these instructions.  Will watch your condition.  Will get help right away if you are not doing well or get worse. Document Released: 07/22/2007 Document Revised: 02/07/2013 Document Reviewed: 11/25/2012 [facility] Patient Information 2015 [place], [place]. This information is not intended to replace advice given to you by your health care provider. Make sure you discuss any questions you have with your health care provider.        Mediterranean Diet  Why follow it? Research shows. . Those who follow the Mediterranean diet have a reduced risk of heart disease  . The diet is associated with a reduced incidence of Parkinson's and Alzheimer's diseases . People following the diet may have longer life expectancies and lower rates of chronic diseases  . The Dietary Guidelines for Americans recommends the Mediterranean diet as an eating plan to promote health and prevent disease  What Is the  Mediterranean Diet?  . Healthy eating plan based on typical foods and recipes of Mediterranean-style cooking . The diet is primarily a plant based diet; these foods should make up a majority of meals   Starches - Plant based foods should make up a majority of meals - They are an important sources of vitamins, minerals, energy, antioxidants, and fiber - Choose whole grains, foods high in fiber and minimally processed items  - Typical grain sources include wheat, oats, barley, corn, brown rice, bulgar, farro, millet, polenta, couscous  - Various types of beans include chickpeas, lentils, fava beans, black beans, white beans   Fruits  Veggies - Large quantities of antioxidant rich fruits & veggies; 6 or more servings  - Vegetables can be eaten raw or lightly drizzled with oil and cooked  - Vegetables common to the traditional Mediterranean Diet include: artichokes, arugula, beets, broccoli, brussel sprouts, cabbage, carrots, celery, collard greens, cucumbers, eggplant, kale, leeks, lemons, lettuce, mushrooms, okra, onions, peas, peppers, potatoes, pumpkin, radishes, rutabaga, shallots, spinach, sweet potatoes, turnips, zucchini - Fruits common to the Mediterranean Diet include: apples, apricots, avocados, cherries, clementines, dates, figs, grapefruits, grapes, melons, nectarines, oranges, peaches, pears, pomegranates, strawberries, tangerines  Fats - Replace butter and margarine with healthy oils, such as olive oil, canola oil, and tahini  - Limit nuts to no more than a handful a day  - Nuts include walnuts, almonds, pecans, pistachios, pine nuts  - Limit or avoid candied, honey roasted or heavily salted nuts - Olives are central  to the Mediterranean diet - can be eaten whole or used in a variety of dishes   Meats Protein - Limiting red meat: no more than a few times a month - When eating red meat: choose lean cuts and keep the portion to the size of deck of cards - Eggs: approx. 0 to 4 times a  week  - Fish and lean poultry: at least 2 a week  - Healthy protein sources include, chicken, [place], lean beef, lamb - Increase intake of seafood such as tuna, salmon, trout, mackerel, shrimp, scallops - Avoid or limit high fat processed meats such as sausage and bacon  Dairy - Include moderate amounts of low fat dairy products  - Focus on healthy dairy such as fat free yogurt, skim milk, low or reduced fat cheese - Limit dairy products higher in fat such as whole or 2% milk, cheese, ice cream  Alcohol - Moderate amounts of red wine is ok  - No more than 5 oz daily for women (all ages) and men older than age 57  - No more than 10 oz of wine daily for men younger than 2265  Other - Limit sweets and other desserts  - Use herbs and spices instead of salt to flavor foods  - Herbs and spices common to the traditional Mediterranean Diet include: basil, bay leaves, chives, cloves, cumin, fennel, garlic, lavender, marjoram, mint, oregano, parsley, pepper, rosemary, sage, savory, sumac, tarragon, thyme   It's not just a diet, it's a lifestyle:  . The Mediterranean diet includes lifestyle factors typical of those in the region  . Foods, drinks and meals are best eaten with others and savored . Daily physical activity is important for overall good health . This could be strenuous exercise like running and aerobics . This could also be more leisurely activities such as walking, housework, yard-work, or taking the stairs . Moderation is the key; a balanced and healthy diet accommodates most foods and drinks . Consider portion sizes and frequency of consumption of certain foods   Meal Ideas & Options:  . Breakfast:  o Whole wheat toast or whole wheat English muffins with peanut butter & hard boiled egg o Steel cut oats topped with apples & cinnamon and skim milk  o Fresh fruit: banana, strawberries, melon, berries, peaches  o Smoothies: strawberries, bananas, greek yogurt, peanut butter o Low fat  greek yogurt with blueberries and granola  o Egg white omelet with spinach and mushrooms o Breakfast couscous: whole wheat couscous, apricots, skim milk, cranberries  . Sandwiches:  o Hummus and grilled vegetables (peppers, zucchini, squash) on whole wheat bread   o Grilled chicken on whole wheat pita with lettuce, tomatoes, cucumbers or tzatziki  o Tuna salad on whole wheat bread: tuna salad made with greek yogurt, olives, red peppers, capers, green onions o Garlic rosemary lamb pita: lamb sauted with garlic, rosemary, salt & pepper; add lettuce, cucumber, greek yogurt to pita - flavor with lemon juice and black pepper  . Seafood:  o Mediterranean grilled salmon, seasoned with garlic, basil, parsley, lemon juice and black pepper o Shrimp, lemon, and spinach whole-grain pasta salad made with low fat greek yogurt  o Seared scallops with lemon orzo  o Seared tuna steaks seasoned salt, pepper, coriander topped with tomato mixture of olives, tomatoes, olive oil, minced garlic, parsley, green onions and cappers  . Meats:  o Herbed greek chicken salad with kalamata olives, cucumber, feta  o Red bell peppers stuffed with spinach, bulgur,  lean ground beef (or lentils) & topped with feta   o Kebabs: skewers of chicken, tomatoes, onions, zucchini, squash  o [place]key burgers: made with red onions, mint, dill, lemon juice, feta cheese topped with roasted red peppers . Vegetarian o Cucumber salad: cucumbers, artichoke hearts, celery, red onion, feta cheese, tossed in olive oil & lemon juice  o Hummus and whole grain pita points with a greek salad (lettuce, tomato, feta, olives, cucumbers, red onion) o Lentil soup with celery, carrots made with vegetable broth, garlic, salt and pepper  o Tabouli salad: parsley, bulgur, mint, scallions, cucumbers, tomato, radishes, lemon juice, olive oil, salt and pepper. o

## 2014-01-06 LAB — HEPATITIS C ANTIBODY: HCV Ab: NEGATIVE

## 2014-01-07 ENCOUNTER — Encounter: Payer: Self-pay | Admitting: Family Medicine

## 2014-01-07 NOTE — Progress Notes (Signed)
S:  This 58 y.o. Cauc male is here to follow-up re: HTN and lipid disorder. He is compliant w/ medications and reports no side effects. He has been taking atorvastatin 20 mg, though his medication list has both 20 and 40 mg tablets listed. He states he had a large supply of 40 mg tablets so he finished taking those tablets then changed to 20 mg tablets.   Patient Active Problem List   Diagnosis Date Noted  . Essential hypertension, benign 10/11/2013  . Mixed hyperlipidemia 10/11/2013    Prior to Admission medications   Medication Sig Start Date End Date Taking? Authorizing Provider  atorvastatin (LIPITOR) 20 MG tablet TAKE 1 TABLET BY MOUTH EVERY DAY 12/07/13  Yes Carmelina DaneJeffery S Anderson, MD  ibuprofen (ADVIL,MOTRIN) 200 MG tablet Take 400 mg by mouth every 6 (six) hours as needed for moderate pain.   Yes Historical Provider, MD  lisinopril (PRINIVIL,ZESTRIL) 20 MG tablet Take 1 tablet (20 mg total) by mouth daily.   Yes Maurice MarchBarbara B Daquann Merriott, MD   [facility] and [facility]x reviewed.  ROS: negative for diaphoresis, fatigue, abnormal weight change, vision disturbances, CP or tightness, palpitations, SOB or DOE, cough, GI upset, myalgias, HA, dizziness, lightheadedness, numbness, weakness or syncope.  O: Filed Vitals:   01/05/14 1129  BP: 150/84  Pulse: 80  Temp: 97.6 F (36.4 C)  Resp: 16    GEN: In NAD; WN,WD HENT: [place]/AT; EOMI w/ clear conj/sclerae. Otherwise unremarkable. COR: RRR. LUNGS: Unlabored resp. SKIN: W&D; intact.  MS: MAEs; no deformities or muscle atrophy. NEURO: A&O x 3; CNs intact. Nonfocal.  A/P: Essential hypertension, benign- Stable on current medication. Continue same.  Mixed hyperlipidemia - Continue atorvastatin 20 mg tablet pending lab results. Plan: Lipid panel, ALT  Need for prophylactic vaccination and inoculation against influenza - Plan: Flu Vaccine QUAD 36+ mos IM  Need for hepatitis C screening test - Plan: Hepatitis C Ab Reflex HCV RNA, QUANT  Meds ordered  this encounter  Medications  . lisinopril (PRINIVIL,ZESTRIL) 20 MG tablet    Sig: Take 1 tablet (20 mg total) by mouth daily.    Dispense:  30 tablet    Refill:  5

## 2014-01-09 NOTE — Progress Notes (Signed)
Quick Note:  Please advise pt regarding following labs...  Your lipid panel/ cholesterol numbers look much better. Continue current medications. Have your pharmacy contact us when you need additional refills. Hepatitis C antibody test is negative.  [place]oy to pt. ______

## 2014-01-10 ENCOUNTER — Encounter: Payer: Self-pay | Admitting: Radiology

## 2014-03-19 ENCOUNTER — Other Ambulatory Visit: Payer: Self-pay | Admitting: Emergency Medicine

## 2014-04-13 ENCOUNTER — Ambulatory Visit: Payer: BC Managed Care – PPO | Admitting: Family Medicine

## 2014-05-10 ENCOUNTER — Telehealth: Payer: Self-pay | Admitting: *Deleted

## 2014-05-10 ENCOUNTER — Encounter: Payer: Self-pay | Admitting: Family Medicine

## 2014-05-10 ENCOUNTER — Ambulatory Visit (INDEPENDENT_AMBULATORY_CARE_PROVIDER_SITE_OTHER): Payer: BLUE CROSS/BLUE SHIELD | Admitting: Family Medicine

## 2014-05-10 VITALS — BP 150/80 | HR 85 | Temp 98.4°F | Resp 16 | Ht 66.5 in | Wt 180.4 lb

## 2014-05-10 DIAGNOSIS — M2550 Pain in unspecified joint: Secondary | ICD-10-CM | POA: Diagnosis not present

## 2014-05-10 DIAGNOSIS — E782 Mixed hyperlipidemia: Secondary | ICD-10-CM | POA: Diagnosis not present

## 2014-05-10 DIAGNOSIS — I10 Essential (primary) hypertension: Secondary | ICD-10-CM | POA: Diagnosis not present

## 2014-05-10 LAB — LIPID PANEL
Cholesterol: 200 mg/dL (ref 0–200)
HDL: 39 mg/dL — ABNORMAL LOW (ref >=40)
LDL Cholesterol: 113 mg/dL — ABNORMAL HIGH (ref 0–99)
Total CHOL/HDL Ratio: 5.1 Ratio
Triglycerides: 241 mg/dL — ABNORMAL HIGH (ref <150)
VLDL: 48 mg/dL — AB (ref 0–40)

## 2014-05-10 LAB — ALT: ALT: 33 U/L (ref 0–53)

## 2014-05-10 LAB — BASIC METABOLIC PANEL
BUN: 12 mg/dL (ref 6–23)
CALCIUM: 9.7 mg/dL (ref 8.4–10.5)
CO2: 23 mEq/L (ref 19–32)
Chloride: 98 mEq/L (ref 96–112)
Creat: 0.75 mg/dL (ref 0.50–1.35)
Glucose, Bld: 96 mg/dL (ref 70–99)
Potassium: 5.2 mEq/L (ref 3.5–5.3)
SODIUM: 133 meq/L — AB (ref 135–145)

## 2014-05-10 MED ORDER — LISINOPRIL-HYDROCHLOROTHIAZIDE 20-12.5 MG PO TABS: 1.0000 | ORAL_TABLET | Freq: Every day | ORAL | Status: DC

## 2014-05-10 MED ORDER — ATORVASTATIN CALCIUM 40 MG PO TABS: 40.0000 mg | ORAL_TABLET | Freq: Every day | ORAL | Status: DC

## 2014-05-10 NOTE — Progress Notes (Signed)
   Subjective:    Patient ID: [name], male    DOB: Jun 24, 1955, 59 y.o.   MRN: [id number]  HPI  This 59 y.o. Male has HTN; he is compliant w/ medication but takes it at different times of the day. Has BP cuff but not checking. He has occasional palpitations in the evening. Caffeine consumption- 2 cups of coffee and tea; not enough water. He walks all day at work and mows his lawn. He used to play golf but is having increased knee and hip pain (never evaluated).  Lipid disorder- takes Atorvastatin 20 mg daily but dose was 40 mg last fall. He denies myalgias, GI or cognitive problems at that dose.  Patient Active Problem List   Diagnosis Date Noted  . Essential hypertension, benign 10/11/2013  . Mixed hyperlipidemia 10/11/2013    Prior to Admission medications   Medication Sig Start Date End Date Taking? Authorizing Provider  atorvastatin (LIPITOR) 20 MG tablet TAKE 1 TABLET BY MOUTH EVERY DAY 03/20/14  Yes Maurice MarchBarbara B Anjana Cheek, MD  ibuprofen (ADVIL,MOTRIN) 200 MG tablet Take 400 mg by mouth every 6 (six) hours as needed for moderate pain.   Yes Historical Provider, MD  lisinopril (PRINIVIL,ZESTRIL) 20 MG tablet Take 1 tablet (20 mg total) by mouth daily. 01/05/14  Yes Maurice MarchBarbara B Kailea Dannemiller, MD    SOC and FAM HX reviewed.   Review of Systems  Constitutional: Negative.   Eyes: Negative for visual disturbance.  Respiratory: Negative for cough, chest tightness and shortness of breath.   Cardiovascular: Negative.   Musculoskeletal: Positive for arthralgias. Negative for myalgias, back pain, joint swelling and gait problem.  Skin: Negative.   Neurological: Negative for dizziness, syncope, weakness, numbness and headaches.       Occasional lightheadedness when BP is up.   Psychiatric/Behavioral: Negative.        Objective:   Physical Exam  Constitutional: He is oriented to person, place, and time. He appears well-developed and well-nourished. No distress.  Blood pressure 150/80, pulse  85, temperature 98.4 F (36.9 C), temperature source Oral, resp. rate 16, height 5' 6.5" (1.689 m), weight 180 lb 6.4 oz (81.829 kg), SpO2 98 %.    HENT:  Head: Normocephalic and atraumatic.  Right Ear: External ear normal.  Left Ear: External ear normal.  Mouth/Throat: Oropharynx is clear and moist.  Eyes: EOM and lids are normal. Right conjunctiva is injected. Left conjunctiva is injected. No scleral icterus.  Neck: Normal range of motion. Neck supple.  Cardiovascular: Normal rate and regular rhythm.   Pulmonary/Chest: Effort normal. No respiratory distress.  Musculoskeletal: Normal range of motion. He exhibits no edema.  Neurological: He is alert and oriented to person, place, and time. No cranial nerve deficit. Coordination normal.  Skin: Skin is warm and dry. He is not diaphoretic.  Psychiatric: He has a normal mood and affect. His behavior is normal. Judgment and thought content normal.  Nursing note and vitals reviewed.     Assessment & Plan:  Essential hypertension, benign - Inadequate control; change medication to Lisinopril- HCTZ 20-12.5 mg 1 tablet daily; advised consistent administration time. Check BP several tiems a week. Plan: Basic metabolic panel  Mixed hyperlipidemia - Resume Atorvastatin 40 mg daily. Plan: ALT, Lipid panel  Multiple joint pain - Suggested OTC supplements (Bio-Flex, Fish Oil or Flax seed oil, tumeric) Plan: Vitamin D, 25-hydroxy

## 2014-05-10 NOTE — Patient Instructions (Signed)
Osteoarthritis Osteoarthritis is a disease that causes soreness and inflammation of a joint. It occurs when the cartilage at the affected joint wears down. Cartilage acts as a cushion, covering the ends of bones where they meet to form a joint. Osteoarthritis is the most common form of arthritis. It often occurs in older people. The joints affected most often by this condition include those in the:  Ends of the fingers.  Thumbs.  Neck.  Lower back.  Knees.  Hips. CAUSES  Over time, the cartilage that covers the ends of bones begins to wear away. This causes bone to rub on bone, producing pain and stiffness in the affected joints.  RISK FACTORS Certain factors can increase your chances of having osteoarthritis, including:  Older age.  Excessive body weight.  Overuse of joints.  Previous joint injury. SIGNS AND SYMPTOMS   Pain, swelling, and stiffness in the joint.  Over time, the joint may lose its normal shape.  Small deposits of bone (osteophytes) may grow on the edges of the joint.  Bits of bone or cartilage can break off and float inside the joint space. This may cause more pain and damage. DIAGNOSIS  Your health care provider will do a physical exam and ask about your symptoms. Various tests may be ordered, such as:  X-rays of the affected joint.  An MRI scan.  Blood tests to rule out other types of arthritis.  Joint fluid tests. This involves using a needle to draw fluid from the joint and examining the fluid under a microscope. TREATMENT  Goals of treatment are to control pain and improve joint function. Treatment plans may include:  A prescribed exercise program that allows for rest and joint relief.  A weight control plan.  Pain relief techniques, such as:  Properly applied heat and cold.  Electric pulses delivered to nerve endings under the skin (transcutaneous electrical nerve stimulation [TENS]).  Massage.  Certain nutritional  supplements.  Medicines to control pain, such as:  Acetaminophen.  Nonsteroidal anti-inflammatory drugs (NSAIDs), such as naproxen.  Narcotic or central-acting agents, such as tramadol.  Corticosteroids. These can be given orally or as an injection.  Surgery to reposition the bones and relieve pain (osteotomy) or to remove loose pieces of bone and cartilage. Joint replacement may be needed in advanced states of osteoarthritis. HOME CARE INSTRUCTIONS   Take medicines only as directed by your health care provider.  Maintain a healthy weight. Follow your health care provider's instructions for weight control. This may include dietary instructions.  Exercise as directed. Your health care provider can recommend specific types of exercise. These may include:  Strengthening exercises. These are done to strengthen the muscles that support joints affected by arthritis. They can be performed with weights or with exercise bands to add resistance.  Aerobic activities. These are exercises, such as brisk walking or low-impact aerobics, that get your heart pumping.  Range-of-motion activities. These keep your joints limber.  Balance and agility exercises. These help you maintain daily living skills.  Rest your affected joints as directed by your health care provider.  Keep all follow-up visits as directed by your health care provider. SEEK MEDICAL CARE IF:   Your skin turns red.  You develop a rash in addition to your joint pain.  You have worsening joint pain.  You have a fever along with joint or muscle aches. SEEK IMMEDIATE MEDICAL CARE IF:  You have a significant loss of weight or appetite.  You have night sweats. FOR MORE  INFORMATION   [employer] and Musculoskeletal and Skin Diseases: www.niams.http://www.myers.net/nih.gov  [employer] Aging: https://walker.com/www.nia.nih.gov  American College of Rheumatology: www.rheumatology.org Document Released: 02/02/2005 Document Revised:  06/19/2013 Document Reviewed: 10/10/2012 [facility] Patient Information 2015 [place]. This information is not intended to replace advice given to you by your health care provider. Make sure you discuss any questions you have with your health care provider.    Here are some supplements that are available over-the-counter that can help reduce joint pain:  BIO-FLEX (active ingredients are glucosamine, chondroitin and MSM). You can find a generic joint formula with these ingredients.  TUMERIC capsules help reduce joint discomfort.  TART CHERRIES or a supplement of this type is helpful also. Take a FLAX SEED OIL or FISH OIL 1200 mg supplement as we discussed can be helpful foe heart health and help reduce joint pain and inflammation.   You will see Burgess Estelleod McVeigh, PA-C in 6 months for physical exam. If BP is not 140/90 or less on the new medication, schedule to see Tawanna Coolerodd and discuss what other changes need to be made. 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

## 2014-05-10 NOTE — Telephone Encounter (Signed)
Faxed signed authorization for medical records. Confirmation page received at 4:00 pm.

## 2014-05-11 LAB — VITAMIN D 25 HYDROXY (VIT D DEFICIENCY, FRACTURES): VIT D 25 HYDROXY: 20 ng/mL — AB (ref 30–100)

## 2014-05-12 NOTE — Progress Notes (Signed)
Quick Note:  Please advise pt regarding following labs...  Metabolic panel (which includes your kidney function) is good. Vitamin D level is low; get an over-the-counter Vitamin D 5000 units and take it daily. Try to get some sun exposure for 10- 15 minutes most days of the week; this helps your body use the Vitamin D for a healthier metabolism. Eat more foods like salmon, sardines, and other cold water fish, mushrooms and some dairy products.  Your cholesterol/ lipid profile shows a low HDL and high triglycerides. Triglycerides are a different form of fat in the blood that come from eating too much processed foods, "junk food" snacks and pastries. This all increases your risk for heart disease. Keep taking your cholesterol medication (atorvastatin 40 mg every night). As we discussed, you should have been on 40 mg instead of 20 mg. Your labs will be rechecked when you return to see Raelyn Ensignodd McVeigh, PA-C in Sept 2016. Focus on making healthy food choices and getting more active (exercise for fun!).   Mail a copy to pt. ______

## 2014-05-14 ENCOUNTER — Encounter: Payer: Self-pay | Admitting: Family Medicine

## 2014-07-18 DIAGNOSIS — Q242 Cor triatriatum: Secondary | ICD-10-CM

## 2014-07-18 HISTORY — DX: Cor triatriatum: Q24.2

## 2014-07-25 ENCOUNTER — Inpatient Hospital Stay (HOSPITAL_COMMUNITY)
Admission: EM | Admit: 2014-07-25 | Discharge: 2014-07-27 | DRG: 065 | Disposition: A | Discharge status: Rehabilitation Facility | Payer: BLUE CROSS/BLUE SHIELD | Attending: Family Medicine | Admitting: Family Medicine

## 2014-07-25 ENCOUNTER — Encounter (HOSPITAL_COMMUNITY): Payer: Self-pay | Admitting: *Deleted

## 2014-07-25 ENCOUNTER — Emergency Department (HOSPITAL_COMMUNITY): Payer: BLUE CROSS/BLUE SHIELD

## 2014-07-25 DIAGNOSIS — I6521 Occlusion and stenosis of right carotid artery: Secondary | ICD-10-CM | POA: Diagnosis present

## 2014-07-25 DIAGNOSIS — Z791 Long term (current) use of non-steroidal anti-inflammatories (NSAID): Secondary | ICD-10-CM | POA: Diagnosis not present

## 2014-07-25 DIAGNOSIS — Z720 Tobacco use: Secondary | ICD-10-CM | POA: Diagnosis not present

## 2014-07-25 DIAGNOSIS — F1010 Alcohol abuse, uncomplicated: Secondary | ICD-10-CM | POA: Diagnosis present

## 2014-07-25 DIAGNOSIS — I63411 Cerebral infarction due to embolism of right middle cerebral artery: Principal | ICD-10-CM | POA: Diagnosis present

## 2014-07-25 DIAGNOSIS — I63131 Cerebral infarction due to embolism of right carotid artery: Secondary | ICD-10-CM | POA: Diagnosis not present

## 2014-07-25 DIAGNOSIS — F17210 Nicotine dependence, cigarettes, uncomplicated: Secondary | ICD-10-CM | POA: Diagnosis present

## 2014-07-25 DIAGNOSIS — Z79899 Other long term (current) drug therapy: Secondary | ICD-10-CM

## 2014-07-25 DIAGNOSIS — R471 Dysarthria and anarthria: Secondary | ICD-10-CM | POA: Diagnosis present

## 2014-07-25 DIAGNOSIS — Z7902 Long term (current) use of antithrombotics/antiplatelets: Secondary | ICD-10-CM | POA: Diagnosis not present

## 2014-07-25 DIAGNOSIS — I639 Cerebral infarction, unspecified: Secondary | ICD-10-CM | POA: Diagnosis present

## 2014-07-25 DIAGNOSIS — F1011 Alcohol abuse, in remission: Secondary | ICD-10-CM | POA: Insufficient documentation

## 2014-07-25 DIAGNOSIS — Z7982 Long term (current) use of aspirin: Secondary | ICD-10-CM | POA: Diagnosis not present

## 2014-07-25 DIAGNOSIS — E785 Hyperlipidemia, unspecified: Secondary | ICD-10-CM | POA: Diagnosis not present

## 2014-07-25 DIAGNOSIS — E871 Hypo-osmolality and hyponatremia: Secondary | ICD-10-CM | POA: Diagnosis present

## 2014-07-25 DIAGNOSIS — I10 Essential (primary) hypertension: Secondary | ICD-10-CM | POA: Diagnosis present

## 2014-07-25 DIAGNOSIS — I6350 Cerebral infarction due to unspecified occlusion or stenosis of unspecified cerebral artery: Secondary | ICD-10-CM | POA: Diagnosis not present

## 2014-07-25 DIAGNOSIS — Z8249 Family history of ischemic heart disease and other diseases of the circulatory system: Secondary | ICD-10-CM | POA: Diagnosis not present

## 2014-07-25 DIAGNOSIS — G459 Transient cerebral ischemic attack, unspecified: Secondary | ICD-10-CM | POA: Diagnosis not present

## 2014-07-25 DIAGNOSIS — E782 Mixed hyperlipidemia: Secondary | ICD-10-CM | POA: Diagnosis present

## 2014-07-25 DIAGNOSIS — I63239 Cerebral infarction due to unspecified occlusion or stenosis of unspecified carotid arteries: Secondary | ICD-10-CM | POA: Insufficient documentation

## 2014-07-25 HISTORY — DX: Cerebral infarction, unspecified: I63.9

## 2014-07-25 LAB — COMPREHENSIVE METABOLIC PANEL
ALBUMIN: 3.9 g/dL (ref 3.5–5.0)
ALT: 33 U/L (ref 17–63)
ANION GAP: 8 (ref 5–15)
AST: 26 U/L (ref 15–41)
Alkaline Phosphatase: 62 U/L (ref 38–126)
BILIRUBIN TOTAL: 0.6 mg/dL (ref 0.3–1.2)
BUN: 19 mg/dL (ref 6–20)
CHLORIDE: 101 mmol/L (ref 101–111)
CO2: 24 mmol/L (ref 22–32)
Calcium: 9.7 mg/dL (ref 8.9–10.3)
Creatinine, Ser: 0.95 mg/dL (ref 0.61–1.24)
GFR calc Af Amer: >60 mL/min (ref >60)
GFR calc non Af Amer: >60 mL/min (ref >60)
Glucose, Bld: 87 mg/dL (ref 65–99)
Potassium: 3.9 mmol/L (ref 3.5–5.1)
Sodium: 133 mmol/L — ABNORMAL LOW (ref 135–145)
TOTAL PROTEIN: 7.4 g/dL (ref 6.5–8.1)

## 2014-07-25 LAB — DIFFERENTIAL
BASOS ABS: 0.1 10*3/uL (ref 0.0–0.1)
Basophils Relative: 1 % (ref 0–1)
EOS ABS: 0.2 10*3/uL (ref 0.0–0.7)
EOS PCT: 2 % (ref 0–5)
LYMPHS ABS: 3.5 10*3/uL (ref 0.7–4.0)
LYMPHS PCT: 28 % (ref 12–46)
Monocytes Absolute: 1.1 10*3/uL — ABNORMAL HIGH (ref 0.1–1.0)
Monocytes Relative: 9 % (ref 3–12)
NEUTROS ABS: 7.8 10*3/uL — AB (ref 1.7–7.7)
NEUTROS PCT: 60 % (ref 43–77)

## 2014-07-25 LAB — I-STAT TROPONIN, ED: Troponin i, poc: 0 ng/mL (ref 0.00–0.08)

## 2014-07-25 LAB — CBC
HCT: 42.4 % (ref 39.0–52.0)
HEMOGLOBIN: 14.8 g/dL (ref 13.0–17.0)
MCH: 32.3 pg (ref 26.0–34.0)
MCHC: 34.9 g/dL (ref 30.0–36.0)
MCV: 92.6 fL (ref 78.0–100.0)
Platelets: 283 10*3/uL (ref 150–400)
RBC: 4.58 MIL/uL (ref 4.22–5.81)
RDW: 12.3 % (ref 11.5–15.5)
WBC: 12.7 10*3/uL — AB (ref 4.0–10.5)

## 2014-07-25 LAB — APTT: APTT: 35 s (ref 24–37)

## 2014-07-25 LAB — PROTIME-INR
INR: 0.98 (ref 0.00–1.49)
PROTHROMBIN TIME: 13.2 s (ref 11.6–15.2)

## 2014-07-25 LAB — I-STAT CHEM 8, ED
BUN: 22 mg/dL — ABNORMAL HIGH (ref 6–20)
CREATININE: 0.9 mg/dL (ref 0.61–1.24)
Calcium, Ion: 1.15 mmol/L (ref 1.12–1.23)
Chloride: 102 mmol/L (ref 101–111)
Glucose, Bld: 89 mg/dL (ref 65–99)
HCT: 45 % (ref 39.0–52.0)
Hemoglobin: 15.3 g/dL (ref 13.0–17.0)
Potassium: 4 mmol/L (ref 3.5–5.1)
Sodium: 134 mmol/L — ABNORMAL LOW (ref 135–145)
TCO2: 21 mmol/L (ref 0–100)

## 2014-07-25 LAB — CBG MONITORING, ED: Glucose-Capillary: 86 mg/dL (ref 65–99)

## 2014-07-25 MED ORDER — ASPIRIN 300 MG RE SUPP: 300.0000 mg | Freq: Every day | RECTAL | Status: DC

## 2014-07-25 MED ORDER — SODIUM CHLORIDE 0.9 % IV SOLN
INTRAVENOUS | Status: DC
  Administered 2014-07-25 – 2014-07-27 (×3): via INTRAVENOUS

## 2014-07-25 MED ORDER — ASPIRIN 325 MG PO TABS
325.0000 mg | ORAL_TABLET | Freq: Every day | ORAL | Status: DC
  Administered 2014-07-26: 325 mg via ORAL
  Filled 2014-07-25: qty 1

## 2014-07-25 MED ORDER — FOLIC ACID 1 MG PO TABS
1.0000 mg | ORAL_TABLET | Freq: Every day | ORAL | Status: DC
  Administered 2014-07-25 – 2014-07-26 (×2): 1 mg via ORAL
  Filled 2014-07-25 (×2): qty 1

## 2014-07-25 MED ORDER — ENOXAPARIN SODIUM 40 MG/0.4ML ~~LOC~~ SOLN
40.0000 mg | SUBCUTANEOUS | Status: DC
Start: 2014-07-25 — End: 2014-07-27
  Administered 2014-07-25 – 2014-07-26 (×2): 40 mg via SUBCUTANEOUS
  Filled 2014-07-25 (×3): qty 0.4

## 2014-07-25 MED ORDER — LORAZEPAM 2 MG/ML IJ SOLN: 1.0000 mg | Freq: Four times a day (QID) | INTRAMUSCULAR | Status: DC | PRN

## 2014-07-25 MED ORDER — THIAMINE HCL 100 MG/ML IJ SOLN: 100.0000 mg | Freq: Every day | INTRAMUSCULAR | Status: DC

## 2014-07-25 MED ORDER — STROKE: EARLY STAGES OF RECOVERY BOOK: Freq: Once | Status: DC

## 2014-07-25 MED ORDER — VITAMIN B-1 100 MG PO TABS
100.0000 mg | ORAL_TABLET | Freq: Every day | ORAL | Status: DC
  Administered 2014-07-25 – 2014-07-26 (×2): 100 mg via ORAL
  Filled 2014-07-25 (×2): qty 1

## 2014-07-25 MED ORDER — ADULT MULTIVITAMIN W/MINERALS CH
1.0000 | ORAL_TABLET | Freq: Every day | ORAL | Status: DC
  Administered 2014-07-25 – 2014-07-26 (×2): 1 via ORAL
  Filled 2014-07-25 (×2): qty 1

## 2014-07-25 MED ORDER — LORAZEPAM 1 MG PO TABS
1.0000 mg | ORAL_TABLET | Freq: Four times a day (QID) | ORAL | Status: DC | PRN
  Administered 2014-07-26: 1 mg via ORAL
  Filled 2014-07-25: qty 1

## 2014-07-25 NOTE — Progress Notes (Signed)
Pt is admitted to [facility] 10 from ED. Pt is AO X 4. Admission vital signs are stable

## 2014-07-25 NOTE — Code Documentation (Signed)
59[facility].  Patient with LKW at 1100 per patient's wife.  Patient's wife left the house and returned to find her husband with slurred speech.  Patient presenting to ED with left facial droop and dysarthria.  Code stroke activated.  Patient to CT.  Stroke team to the bedside.  NIHSS 2, see documentation for details and code stroke times.  Patient with mild left facial droop and left face decreased sensation on exam.  Dr. Thad Rangereynolds at the bedside discussing plan of care with patient's wife.  Patient is outside the window for treatment with tPA and is not a candidate for endovascular intervention at this time.  Bedside handoff with ED RN Minerva AreolaEric.

## 2014-07-25 NOTE — H&P (Signed)
Family Medicine Teaching [facility] Admission History and Physical Service Pager: [phone]  Patient name: [name] Medical record number: [id number] Date of birth: 1955-10-13 Age: 59 y.o. Gender: male  Primary Care Provider: Dow Adolph, MD Consultants: neurology (consulted in ED) Code Status: Full   Chief Complaint: dysarthria, facial droop   Assessment and Plan: Tauren Delbuono is a 59 y.o. male presenting with slurred speech/dysarthria with R frontal cortex CVA . PMH is significant for HTN, HLD, tobacco and alcohol abuse.   Anterior R MCA infarct: P/w sudden onset L facial droop since ~1pm. Symptoms improving since being in ED, so not a TPA candidate. Neurology consulted by ED. CT shows infarct of anterior R MCA with no associated hemorrhage or mass effect and confirmed on MRI. WBC elevated to 12.7 2/2 stress reaction. - Admit to FPTS, telemetry, attending Gwendolyn Grant - Risk strat labs: A1c, lipid panel pending - PT/OT/SLP consults - Echocardiogram, carotid dopplers ordered - Aspirin  daily - NPO pending RN stroke swallow screen - telemetry monitoring - frequent neuro checks  Alcohol abuse: Patient reports drinking 6-18 beers a day. - monitor on CIWA protocol  HTN: On lisinopril-HCTZ 20-12.5mg  daily at home. BP normotensive to mildly elevated in ED - permissive HTN O/N - holding home antihypertensives currently.  HLD - Continue home atorvastatin  daily when able to take PO - lipid panel  Hyponatremia: Na 134 on admission. - Continue to monitor - IVF  FEN/GI: NPO pending RN stroke swallow screen, NS @ 189mL/hr Prophylaxis: lovenox  Disposition: Admit to tele, FPTS, attending Gwendolyn Grant  History of Present Illness: [name] is a 59 y.o. male presenting with CVA.  Pt states he was in his normal state of health this AM but started performing some yardwork this PM.  He started having facial droop and dysarthria early this PM along with trouble driving.  He  proceeded to pull off the side of the road where he called his wife.  At that point, they drove to the ED.  He denied any CP, blurred vision, abdominal pain, SOB, paresthesias, weakness in his extremities.  He does smoke about 1 ppd for the previous 30 years and drinks about 6-18 12 oz beers per day.  Denies illicit drug use.    In the ED, pt had multiple evaluations including CMP with Na of 133, CT head showing anterior MCA cortical based infarct.  He was made NPO and started on fluids with neurology consult.    Review Of Systems: Per HPI   Patient Active Problem List   Diagnosis Date Noted  . Essential hypertension, benign 10/11/2013  . Mixed hyperlipidemia 10/11/2013   Past Medical History: Past Medical History  Diagnosis Date  . Arthritis   . Hypertension   . Hyperlipidemia    Past Surgical History: History reviewed. No pertinent past surgical history. Social History: History  Substance Use Topics  . Smoking status: Current Every Day Smoker -- 0.50 packs/day for 40 years    Types: Cigarettes  . Smokeless tobacco: Not on file  . Alcohol Use: 10.8 oz/week    18 Cans of beer per week   Family History: Family History  Problem Relation Age of Onset  . Cancer Father   . Heart attack Brother    Allergies and Medications: No Known Allergies No current facility-administered medications on file prior to encounter.   Current Outpatient Prescriptions on File Prior to Encounter  Medication Sig Dispense Refill  . atorvastatin (LIPITOR) 40 MG tablet Take 1 tablet (40 mg total)  by mouth daily. 30 tablet 5  . ibuprofen (ADVIL,MOTRIN) 200 MG tablet Take 400 mg by mouth every 6 (six) hours as needed for moderate pain.    Marland Kitchen. lisinopril-hydrochlorothiazide (PRINZIDE,ZESTORETIC) 20-12.5 MG per tablet Take 1 tablet by mouth daily. 30 tablet 5    Objective: BP 133/79 mmHg  Pulse 71  Temp(Src) 99.1 F (37.3 C) (Oral)  Resp 15  Ht 5\' 6"  (1.676 m)  Wt 169 lb 12.1 oz (77.001 kg)  BMI  27.41 kg/m2  SpO2 97% Exam: General: NAD HEENT: [place]/AT, MMM, PERRLA, EOMI B/L  Cardiovascular: RRR, no murmurs appreciated  Respiratory: CTAB, normal WOB Abdomen: S/NT/ND, NABS Extremities: No edema, WWP  Skin: No rashes  Neuro: AAO x3, speech slightly dysarthric but fluent, slight L facial drop, CN otherwise intact, strength and sensation grossly intact.  Labs and Imaging: CBC BMET   Recent Labs Lab 07/25/14 1531 07/25/14 1537  WBC 12.7*  --   HGB 14.8 15.3  HCT 42.4 45.0  PLT 283  --     Recent Labs Lab 07/25/14 1531 07/25/14 1537  NA 133* 134*  K 3.9 4.0  CL 101 102  CO2 24  --   BUN 19 22*  CREATININE 0.95 0.90  GLUCOSE 87 89  CALCIUM 9.7  --       MRI (6/8) IMPRESSION: 1. Acute nonhemorrhagic infarct of the right frontal operculum and anterior portion of the insular cortex. 2. Acute punctate infarct in the high right parietal lobe near the vertex. 3. Slow or occluded flow in the right internal carotid artery to the level of the posterior communicating artery. 4. Age advanced periventricular T2 changes likely reflect the sequela of chronic microvascular ischemia.   Ct Head (brain) Wo Contrast (6/8):  IMPRESSION: 1. Anterior division right MCA territory cortically based infarct. ASPECTS score = 8. 2. No associated hemorrhage or mass effect.   Erasmo DownerAngela M Bacigalupo, MD 07/25/2014, 5:01 PM PGY-1, [facility] FPTS Intern pager: [phone], text pages welcome   I have seen and evaluated the pt with Dr. Beryle FlockBacigalupo.  CVA of R frontal cortex, CVA w/u for underlying etiology vs 2/2 to tobacco/HTN/HLD.  Twana FirstBryan R. Hess PGY 3 [facility]  07/25/2014, 7:05 PM

## 2014-07-25 NOTE — ED Notes (Signed)
Patient transported to MRI 

## 2014-07-25 NOTE — ED Notes (Signed)
Pt arrived at triage with c/o L sided facial droop and slurred speech.  LKW 2pm.  NS notified of Code Stroke Activation.  Pt taken to bridge for EDP airway clearance by Tammy SoursGreg, RN.

## 2014-07-25 NOTE — Progress Notes (Signed)
Pts mother called me into the room; [name] had developed dysarthria with his speech......called rapid response nurse who called Dr. Elige Koamello to notify him of patients status change.  Completed NIH scale on patient and noticed some bilateral peripheral vision loss as well.  Dr. Elige Koamello called after reviewing the MRI and said we were not going to call a new code stroke since symptoms were the same as what he presented with earlier.  Will continue to monitor patient for any other changes that may occur.

## 2014-07-25 NOTE — Consult Note (Addendum)
Referring Physician: Rhunette Croft    Chief Complaint: Code stroke  HPI:                                                                                                                                         [name] is an 59 y.o. male male presenting to the ED as a code stroke. Per wife he was last seen normal at 1100 this am.  She left to go to the store and when she returned his brother confronted her and stated he was having a stroke. His symptoms at that time was left facial droop and slurred speech.  EMS was called and the patient was brought in as a code stroke.  Initial NIHSS of 2.   Wife reports that patient was not himself all day.  He laid around more than usual and was not as active as usual.    Date last known well: Date: 07/25/2014 Time last known well: Time: 11:00 tPA Given: No: symptoms resolving Modified Rankin: Rankin Score=0   Past Medical History  Diagnosis Date  . Arthritis   . Hypertension   . Hyperlipidemia     History reviewed. No pertinent past surgical history.  Family History  Problem Relation Age of Onset  . Cancer Father   . Heart attack Brother    Social History:  reports that he has been smoking Cigarettes.  He has a 20 pack-year smoking history. He does not have any smokeless tobacco history on file. He reports that he drinks about 6-18 drinks per day. He reports that he does not use illicit drugs.  Allergies: No Known Allergies  Medications:                                                                                                                           No current facility-administered medications for this encounter.   Current Outpatient Prescriptions  Medication Sig Dispense Refill  . atorvastatin (LIPITOR) 40 MG tablet Take 1 tablet (40 mg total) by mouth daily. 30 tablet 5  . ibuprofen (ADVIL,MOTRIN) 200 MG tablet Take 400 mg by mouth every 6 (six) hours as needed for moderate pain.    Marland Kitchen lisinopril-hydrochlorothiazide  (PRINZIDE,ZESTORETIC) 20-12.5 MG per tablet Take 1 tablet by mouth daily. 30 tablet 5     ROS:  History obtained from the patient  General ROS: negative for - chills, fatigue, fever, night sweats, weight gain or weight loss Psychological ROS: negative for - behavioral disorder, hallucinations, memory difficulties, mood swings or suicidal ideation Ophthalmic ROS: negative for - blurry vision, double vision, eye pain or loss of vision ENT ROS: negative for - epistaxis, nasal discharge, oral lesions, sore throat, tinnitus or vertigo Allergy and Immunology ROS: negative for - hives or itchy/watery eyes Hematological and Lymphatic ROS: negative for - bleeding problems, bruising or swollen lymph nodes Endocrine ROS: negative for - galactorrhea, hair pattern changes, polydipsia/polyuria or temperature intolerance Respiratory ROS: negative for - cough, hemoptysis, shortness of breath or wheezing Cardiovascular ROS: negative for - chest pain, dyspnea on exertion, edema or irregular heartbeat Gastrointestinal ROS: negative for - abdominal pain, diarrhea, hematemesis, nausea/vomiting or stool incontinence Genito-Urinary ROS: negative for - dysuria, hematuria, incontinence or urinary frequency/urgency Musculoskeletal ROS: negative for - joint swelling or muscular weakness Neurological ROS: as noted in HPI Dermatological ROS: negative for rash and skin lesion changes  Neurologic Examination:                                                                                                      Blood pressure 159/77, pulse 77, temperature 99.1 F (37.3 C), temperature source Oral, resp. rate 15, height 5\' 6"  (1.676 m), weight 77.001 kg (169 lb 12.1 oz), SpO2 97 %.  HEENT-  Normocephalic, no lesions, without obvious abnormality.  Normal external eye and conjunctiva.  Normal  TM's bilaterally.  Normal auditory canals and external ears. Normal external nose, mucus membranes and septum.  Normal pharynx. Cardiovascular- S1, S2 normal, pulses palpable throughout   Lungs- chest clear, no wheezing, rales, normal symmetric air entry Abdomen- normal findings: bowel sounds normal Extremities- no edema Lymph-no adenopathy palpable Musculoskeletal-no joint tenderness, deformity or swelling Skin-warm and dry, no hyperpigmentation, vitiligo, or suspicious lesions  Neurological Examination Mental Status: Alert, oriented, thought content appropriate.  Affect flat. Speech fluent without evidence of aphasia.  Able to follow 3 step commands without difficulty. Cranial Nerves: II: Discs flat bilaterally; Visual fields grossly normal, pupils equal, round, reactive to light and accommodation III,IV, VI: ptosis not present, extra-ocular motions intact bilaterally V,VII: smile asymmetric on the left, facial light touch sensation decreased on the left VIII: hearing normal bilaterally IX,X: uvula rises symmetrically XI: bilateral shoulder shrug XII: midline tongue extension Motor: Right : Upper extremity   5/5    Left:     Upper extremity   5/5  Lower extremity   5/5     Lower extremity   5/5 Tone and bulk:normal tone throughout; no atrophy noted Sensory: Pinprick and light touch intact throughout, bilaterally Deep Tendon Reflexes: 2+ and symmetric throughout UE and 1+ in KJ. No AJ Plantars: Right: downgoing   Left: downgoing Cerebellar: normal finger-to-nose and normal heel-to-shin test Gait: not tested due to multiple leads.     Lab Results: Basic Metabolic Panel:  Recent Labs Lab 07/25/14 1531 07/25/14 1537  NA 133* 134*  K 3.9 4.0  CL 101 102  CO2  24  --   GLUCOSE 87 89  BUN 19 22*  CREATININE 0.95 0.90  CALCIUM 9.7  --     Liver Function Tests:  Recent Labs Lab 07/25/14 1531  AST 26  ALT 33  ALKPHOS 62  BILITOT 0.6  PROT 7.4  ALBUMIN 3.9   No  results for input(s): LIPASE, AMYLASE in the last 168 hours. No results for input(s): AMMONIA in the last 168 hours.  CBC:  Recent Labs Lab 07/25/14 1531 07/25/14 1537  WBC 12.7*  --   NEUTROABS 7.8*  --   HGB 14.8 15.3  HCT 42.4 45.0  MCV 92.6  --   PLT 283  --     Cardiac Enzymes: No results for input(s): CKTOTAL, CKMB, CKMBINDEX, TROPONINI in the last 168 hours.  Lipid Panel: No results for input(s): CHOL, TRIG, HDL, CHOLHDL, VLDL, LDLCALC in the last 168 hours.  CBG:  Recent Labs Lab 07/25/14 1525  GLUCAP 86    Microbiology: No results found for this or any previous visit.  Coagulation Studies:  Recent Labs  07/25/14 1531  LABPROT 13.2  INR 0.98    Imaging: Ct Head (brain) Wo Contrast  07/25/2014   ADDENDUM REPORT: 07/25/2014 15:54  ADDENDUM: Study discussed by telephone with Dr. Thana Farr on 07/25/2014 at 1538 hours.   Electronically Signed   By: Odessa Fleming M.D.   On: 07/25/2014 15:54   07/25/2014   CLINICAL DATA:  59 year old male code stroke. Slurred speech since 1400 hours. Initial encounter.  EXAM: CT HEAD WITHOUT CONTRAST  TECHNIQUE: Contiguous axial images were obtained from the base of the skull through the vertex without intravenous contrast.  COMPARISON:  Neck CT 05/21/2013.  FINDINGS: Minor paranasal sinus mucosal thickening. No acute osseous abnormality identified. Visualized orbit soft tissues are within normal limits. Visualized scalp soft tissues are within normal limits.  Calcified atherosclerosis at the skull base. No suspicious intracranial vascular hyperdensity.  However, there is abnormal gray and white matter hypodensity at the anterior right operculum (series 2, image 16). No associated hemorrhage or mass effect. ASPECTS score = 8.  Gray-white matter differentiation elsewhere is within normal limits. Incidental mega cisterna magna/right posterior fossa arachnoid cyst. No ventriculomegaly. No acute intracranial hemorrhage identified. No midline  shift, mass effect, or evidence of intracranial mass lesion.  IMPRESSION: 1. Anterior division right MCA territory cortically based infarct. ASPECTS score = 8. 2. No associated hemorrhage or mass effect.  Electronically Signed: By: Odessa Fleming M.D. On: 07/25/2014 15:32    Antwain Caliendo PA-C [facility] [phone]  07/25/2014, 4:19 PM   Patient seen and examined.  Clinical course and management discussed.  Necessary edits performed.  I agree with the above.  Assessment and plan of care developed and discussed below.    Assessment: 59 y.o. male presenting with left facial droop and numbness, slurred speech.  Symptoms resolved to some degree by presentation to the ED.  Patient outside of the window for tPA.  Initial NIHSS of 2.  Head CT personally reviewed and shows an area of hypodensity in the right frontal lobe.  Patient on no antiplatelet therapy at home.     Stroke Risk Factors - hyperlipidemia, hypertension and smoking   Recommendations: 1. HgbA1c, fasting lipid panel 2. MRI, MRA  of the brain without contrast 3. PT consult, OT consult, Speech consult 4. Echocardiogram 5. Carotid dopplers 6. Prophylactic therapy-Antiplatelet med: Aspirin - dose 325mg  daily 7. NPO until RN stroke swallow screen 8. Telemetry monitoring 9. Frequent  neuro checks  Case discussed with Dr. Carver Fila, MD [facility] [phone]  07/25/2014  4:28 PM

## 2014-07-25 NOTE — Progress Notes (Addendum)
Interviewed patient, OP notes/chart review after he was interviewed revealed patient belongs to urgent care [place]. ER physician notified and appropriate teaching service to be notified of need to admit patient. Important history obtained: patient does report issues with chronic left hip numbness radiating to knee associated with walking with onset prior to neurological symptoms today. He also admits to drinking 6-18 beers per day. I did notify Dr. Thad Rangereynolds with neurology of the alcohol history. She agrees appropriate to initiate CIWA protocol in this patient. Patient also reports had previously been smoking one pack of cigarettes per day and is currently enrolled in a program at work to stop.  Junious SilkAllison Ellis, ANP

## 2014-07-25 NOTE — ED Notes (Signed)
Family at bedside. 

## 2014-07-25 NOTE — ED Notes (Signed)
Stroke team at beside

## 2014-07-25 NOTE — ED Provider Notes (Signed)
CSN: [id number]     Arrival date & time 07/25/14  1510 History   First MD Initiated Contact with Patient 07/25/14 1517     Chief Complaint  Patient presents with  . Code Stroke     (Consider location/radiation/quality/duration/timing/severity/associated sxs/prior Treatment) HPI Comments: Pt comes in with cc of L sided drooping. Hx of HTN, alcohol abuse. He reports that around 3:30 he started having facial droop, L side and numbness to the L face and LUE. Pt here with his wife, who reports that the sx have improved. CODE stroke activated. No headaches, no trauma.   ROS 10 Systems reviewed and are negative for acute change except as noted in the HPI.     The history is provided by the patient.    Past Medical History  Diagnosis Date  . Arthritis   . Hypertension   . Hyperlipidemia    History reviewed. No pertinent past surgical history. Family History  Problem Relation Age of Onset  . Cancer Father   . Heart attack Brother    History  Substance Use Topics  . Smoking status: Current Every Day Smoker -- 0.50 packs/day for 40 years    Types: Cigarettes  . Smokeless tobacco: Not on file  . Alcohol Use: 10.8 oz/week    18 Cans of beer per week    Review of Systems  Neurological: Positive for weakness and numbness.  All other systems reviewed and are negative.     Allergies  Review of patient's allergies indicates no known allergies.  Home Medications   Prior to Admission medications   Medication Sig Start Date End Date Taking? Authorizing Provider  atorvastatin (LIPITOR) 40 MG tablet Take 1 tablet (40 mg total) by mouth daily. 05/10/14  Yes Maurice March, MD  ibuprofen (ADVIL,MOTRIN) 200 MG tablet Take 400 mg by mouth every 6 (six) hours as needed for moderate pain.   Yes Historical Provider, MD  lisinopril-hydrochlorothiazide (PRINZIDE,ZESTORETIC) 20-12.5 MG per tablet Take 1 tablet by mouth daily. 05/10/14  Yes Maurice March, MD   BP 133/79 mmHg   Pulse 71  Temp(Src) 99.1 F (37.3 C) (Oral)  Resp 15  Ht  (1.676 m)  Wt 169 lb 12.1 oz (77.001 kg)  BMI 27.41 kg/m2  SpO2 97% Physical Exam  Constitutional: He is oriented to person, place, and time. He appears well-developed and well-nourished.  HENT:  Head: Normocephalic and atraumatic.  Eyes: EOM are normal. Pupils are equal, round, and reactive to light.  Neck: Normal range of motion. Neck supple. No JVD present.  Cardiovascular: Normal rate and regular rhythm.   Pulmonary/Chest: Effort normal and breath sounds normal. No respiratory distress. He has no wheezes.  Abdominal: Soft. Bowel sounds are normal. He exhibits no distension. There is no tenderness. There is no rebound and no guarding.  Neurological: He is alert and oriented to person, place, and time.  Mild droop L side and L sided numbness - NIHSS is 2.   Skin: Skin is warm and dry.  Nursing note reviewed.   ED Course  Procedures (including critical care time) Labs Review Labs Reviewed  CBC - Abnormal; Notable for the following:    WBC 12.7 (*)    All other components within normal limits  DIFFERENTIAL - Abnormal; Notable for the following:    Neutro Abs 7.8 (*)    Monocytes Absolute 1.1 (*)    All other components within normal limits  COMPREHENSIVE METABOLIC PANEL - Abnormal; Notable for the following:  Sodium 133 (*)    All other components within normal limits  I-STAT CHEM 8, ED - Abnormal; Notable for the following:    Sodium 134 (*)    BUN 22 (*)    All other components within normal limits  PROTIME-INR  APTT  I-STAT TROPOININ, ED  CBG MONITORING, ED    Imaging Review Ct Head (brain) Wo Contrast  07/25/2014   ADDENDUM REPORT: 07/25/2014 15:54  ADDENDUM: Study discussed by telephone with Dr. Thana FarrLeslie Reynolds on 07/25/2014 at 1538 hours.   Electronically Signed   By: Odessa FlemingH  Hall M.D.   On: 07/25/2014 15:54   07/25/2014   CLINICAL DATA:  59 year old male code stroke. Slurred speech since 1400 hours.  Initial encounter.  EXAM: CT HEAD WITHOUT CONTRAST  TECHNIQUE: Contiguous axial images were obtained from the base of the skull through the vertex without intravenous contrast.  COMPARISON:  Neck CT 05/21/2013.  FINDINGS: Minor paranasal sinus mucosal thickening. No acute osseous abnormality identified. Visualized orbit soft tissues are within normal limits. Visualized scalp soft tissues are within normal limits.  Calcified atherosclerosis at the skull base. No suspicious intracranial vascular hyperdensity.  However, there is abnormal gray and white matter hypodensity at the anterior right operculum (series 2, image 16). No associated hemorrhage or mass effect. ASPECTS score = 8.  Gray-white matter differentiation elsewhere is within normal limits. Incidental mega cisterna magna/right posterior fossa arachnoid cyst. No ventriculomegaly. No acute intracranial hemorrhage identified. No midline shift, mass effect, or evidence of intracranial mass lesion.  IMPRESSION: 1. Anterior division right MCA territory cortically based infarct. ASPECTS score = 8. 2. No associated hemorrhage or mass effect.  Electronically Signed: By: Odessa FlemingH  Hall M.D. On: 07/25/2014 15:32     EKG Interpretation None      MDM   Final diagnoses:  Acute ischemic stroke    Pt with L sided weakness and facial droop. NIHSS is 2 Suspect stroke vs. TIA. Not a TPA candidate given the im;rovement in the sx and minimal symptoms. Will admit.    Derwood KaplanAnkit Labrisha Wuellner, MD 07/26/14 0201

## 2014-07-26 ENCOUNTER — Inpatient Hospital Stay (HOSPITAL_COMMUNITY): Payer: BLUE CROSS/BLUE SHIELD

## 2014-07-26 ENCOUNTER — Encounter (HOSPITAL_COMMUNITY): Payer: Self-pay | Admitting: *Deleted

## 2014-07-26 DIAGNOSIS — I10 Essential (primary) hypertension: Secondary | ICD-10-CM

## 2014-07-26 DIAGNOSIS — G459 Transient cerebral ischemic attack, unspecified: Secondary | ICD-10-CM

## 2014-07-26 DIAGNOSIS — I63411 Cerebral infarction due to embolism of right middle cerebral artery: Principal | ICD-10-CM

## 2014-07-26 DIAGNOSIS — Z720 Tobacco use: Secondary | ICD-10-CM

## 2014-07-26 DIAGNOSIS — I639 Cerebral infarction, unspecified: Secondary | ICD-10-CM

## 2014-07-26 DIAGNOSIS — E785 Hyperlipidemia, unspecified: Secondary | ICD-10-CM

## 2014-07-26 LAB — GLUCOSE, CAPILLARY
GLUCOSE-CAPILLARY: 94 mg/dL (ref 65–99)
GLUCOSE-CAPILLARY: 85 mg/dL (ref 65–99)
GLUCOSE-CAPILLARY: 111 mg/dL — AB (ref 65–99)
GLUCOSE-CAPILLARY: 130 mg/dL — AB (ref 65–99)
Glucose-Capillary: 150 mg/dL — ABNORMAL HIGH (ref 65–99)
Glucose-Capillary: 104 mg/dL — ABNORMAL HIGH (ref 65–99)

## 2014-07-26 LAB — LIPID PANEL
CHOL/HDL RATIO: 5.6 ratio
Cholesterol: 192 mg/dL (ref 0–200)
HDL: 34 mg/dL — ABNORMAL LOW (ref >40)
LDL CALC: 97 mg/dL (ref 0–99)
Triglycerides: 305 mg/dL — ABNORMAL HIGH (ref <150)
VLDL: 61 mg/dL — AB (ref 0–40)

## 2014-07-26 LAB — RAPID URINE DRUG SCREEN, HOSP PERFORMED
Amphetamines: NOT DETECTED
BARBITURATES: NOT DETECTED
Benzodiazepines: NOT DETECTED
COCAINE: NOT DETECTED
Opiates: NOT DETECTED
Tetrahydrocannabinol: NOT DETECTED

## 2014-07-26 MED ORDER — IOHEXOL 350 MG/ML SOLN
50.0000 mL | Freq: Once | INTRAVENOUS | Status: AC | PRN
  Administered 2014-07-26: 50 mL via INTRAVENOUS

## 2014-07-26 MED ORDER — PERFLUTREN LIPID MICROSPHERE
1.0000 mL | INTRAVENOUS | Status: AC | PRN
  Administered 2014-07-26: 3 mL via INTRAVENOUS
  Filled 2014-07-26: qty 10

## 2014-07-26 MED ORDER — NICOTINE 21 MG/24HR TD PT24
21.0000 mg | MEDICATED_PATCH | Freq: Every day | TRANSDERMAL | Status: DC
  Administered 2014-07-26 – 2014-07-27 (×2): 21 mg via TRANSDERMAL
  Filled 2014-07-26 (×2): qty 1

## 2014-07-26 MED ORDER — ATORVASTATIN CALCIUM 40 MG PO TABS
40.0000 mg | ORAL_TABLET | Freq: Every day | ORAL | Status: DC
  Administered 2014-07-26: 40 mg via ORAL
  Filled 2014-07-26: qty 1

## 2014-07-26 NOTE — Progress Notes (Signed)
Family Medicine Teaching Service Daily Progress Note Intern Pager: [phone]  Patient name: [name] Medical record number: [id number] Date of birth: 17-Mar-1955 Age: 59 y.o. Gender: male  Primary Care Provider: Dow Adolph, MD Consultants: neurology Code Status: full code  Pt Overview and Major Events to Date:  6/8- admit to FPTS for R MCA Stroke  Assessment and Plan: [name] is a 59 y.o. male presenting with slurred speech/dysarthria with R frontal cortex CVA . PMH is significant for HTN, HLD, tobacco and alcohol abuse.   Anterior R MCA infarct: P/w sudden onset L facial droop since ~1pm. Symptoms improving since being in ED, so not a TPA candidate. Neurology consulted by ED. CT shows infarct of anterior R MCA with no associated hemorrhage or mass effect and confirmed on MRI along with infarcts to insular cortex and parietal lobe. WBC elevated to 12.7 2/2 stress reaction. - Risk strat labs: A1c pending, lipid panel with LDL 97 - PT/OT/SLP consults - Echocardiogram, carotid dopplers ordered - CTA head/neck ordered - Aspirin  daily - telemetry monitoring - frequent neuro checks - neurology consulting, appreciate recs  Alcohol abuse: Patient reports drinking 6-18 beers a day. - monitor on CIWA protocol - not recorded, but has not required ativan - SW c/s for cessation counseling  HTN: On lisinopril-HCTZ 20-12.5mg  daily at home. BP normotensive. - Allowed permissive HTN O/N - holding home antihypertensives currently.  HLD - Continue home atorvastatin  daily - lipid panel shows LDL 97  Hyponatremia: Na 134 on admission. - Continue to monitor  Tobacco Abuse: Smokes 1PPD x30 yrs - Encourage cessation  FEN/GI: Heart healthy diet, SLIV Prophylaxis: lovenox  Disposition: floor status, d/c pending completion of stroke work-up  Subjective:  More dysarthria and vision loss overnight.  Neurology reported this was consistent with stroke location and previous  symtpoms. Patient feels like this has slightly improved today but still notices some poor peripheral vision (which he thinks may be baseline) and slight dysarthria  Objective: Temp:  [97.5 F (36.4 C)-99.1 F (37.3 C)] 97.5 F (36.4 C) (06/09 0600) Pulse Rate:  [55-82] 66 (06/09 0600) Resp:  [15-23] 18 (06/09 0600) BP: (112-177)/(53-92) 112/66 mmHg (06/09 0600) SpO2:  [94 %-98 %] 97 % (06/09 0600) Weight:  [169 lb 12.1 oz (77.001 kg)] 169 lb 12.1 oz (77.001 kg) (06/08 1529) Physical Exam:  General: NAD, laying comfortably in bed HEENT: [place]/AT, MMM, PERRLA, EOMI B/L  Cardiovascular: RRR, no murmurs appreciated  Respiratory: CTAB, normal WOB Abdomen: S/NT/ND, NABS Extremities: No edema, WWP  Skin: No rashes  Neuro: AAO x3, speech slightly dysarthric but fluent, slight L facial drop, CN otherwise intact, strength and sensation grossly intact, FNF intact  Laboratory:  Recent Labs Lab 07/25/14 1531 07/25/14 1537  WBC 12.7*  --   HGB 14.8 15.3  HCT 42.4 45.0  PLT 283  --     Recent Labs Lab 07/25/14 1531 07/25/14 1537  NA 133* 134*  K 3.9 4.0  CL 101 102  CO2 24  --   BUN 19 22*  CREATININE 0.95 0.90  CALCIUM 9.7  --   PROT 7.4  --   BILITOT 0.6  --   ALKPHOS 62  --   ALT 33  --   AST 26  --   GLUCOSE 87 89    Lipid Panel     Component Value Date/Time   CHOL 192 07/26/2014 0437   TRIG 305* 07/26/2014 0437   HDL 34* 07/26/2014 0437   CHOLHDL 5.6 07/26/2014 0437  VLDL 61* 07/26/2014 0437   LDLCALC 97 07/26/2014 0437    Imaging/Diagnostic Tests: MRI (6/8) IMPRESSION: 1. Acute nonhemorrhagic infarct of the right frontal operculum and anterior portion of the insular cortex. 2. Acute punctate infarct in the high right parietal lobe near the vertex. 3. Slow or occluded flow in the right internal carotid artery to the level of the posterior communicating artery. 4. Age advanced periventricular T2 changes likely reflect the sequela of chronic  microvascular ischemia.   Ct Head (brain) Wo Contrast (6/8): IMPRESSION: 1. Anterior division right MCA territory cortically based infarct. ASPECTS score = 8. 2. No associated hemorrhage or mass effect.   Erasmo Downer, MD 07/26/2014, 7:34 AM PGY-1, [facility] FPTS Intern pager: [phone], text pages welcome

## 2014-07-26 NOTE — Discharge Summary (Signed)
Family Medicine Teaching [facility] Discharge Summary  Patient name: [name] Medical record number: [id number] Date of birth: 08-Aug-1955 Age: 59 y.o. Gender: male Date of Admission: 07/25/2014  Date of Discharge: 07/27/2014  Admitting Physician: Tobey Grim, MD  Primary Care Provider: Dow Adolph, MD Consultants: neurology, cardiology, EP  Indication for Hospitalization: R MCA Stroke  Discharge Diagnoses/Problem List:  Acute embolic stroke  R ICA occlusion  Alcohol abuse HTN HLD Hyponatremia Tobacco abuse  Disposition: home  Discharge Condition: stable/improved  Discharge Exam: See progress note from day of discharge  Brief Hospital Course:   [name] is a 59 y.o. male with PMH of HTN, HLD, alcohol abuse, and tobacco abuse p/w L sided facial droop and dysarthria.  Code Stroke was called upon arrival to ED.  CT head showed infarct of anterior R MCA with no associated hemorrhage or mass effect. Neurology was consulted by ED and followed throughout hospitalization.  MRI brain confirmed infarct in R frontal lobe along with infarcts to R insular cortex and R parietal lobe.  CTA of head and neck showed right ICA occlusion. Transthoracic echocardiogram and transesophageal echocardiogram showed no intracardiac source of embolism. Patient was monitored on telemetry throughout hospitalization with no events.  Suspect embolic source was right ICA occlusion but cannot fully rule out cardioembolic source, so Loop recorder was placed prior to discharge monitor for atrial fibrillation.  Patient allowed permissive HTN during acute period, but restarted on home lisinopril HCTZ on discharge. Was initially treated with aspirin 325 mg daily but then transition aspirin 81 mg daily and Plavix 75 mg daily which were continued on discharge. Risk stratification labs showed A1c of 6.0 lipid panel with LDL of 97 during admission. Home atorvastatin 40 mg daily is continued throughout admission.  Neurology, cardiology, in electrophysiology follow throughout admission and helped with workup as above.  Patient was counseled about tobacco and alcohol cessation during admission. As he reported drinking 6-18 beers daily, he was monitored on CIWA protocol during admission, but did not exhibit any symptoms of withdrawal. All other chronic medical conditions were stable throughout admission and managed with home regimens.  Issues for Follow Up:  - f/u BP - ongoing tobacco and alcohol cessation counseling - patient reported depression and would like to discuss management  Significant Procedures: Loop recorder implantation  Significant Labs and Imaging:   Recent Labs Lab 07/25/14 1531 07/25/14 1537  WBC 12.7*  --   HGB 14.8 15.3  HCT 42.4 45.0  PLT 283  --     Recent Labs Lab 07/25/14 1531 07/25/14 1537  NA 133* 134*  K 3.9 4.0  CL 101 102  CO2 24  --   GLUCOSE 87 89  BUN 19 22*  CREATININE 0.95 0.90  CALCIUM 9.7  --   ALKPHOS 62  --   AST 26  --   ALT 33  --   ALBUMIN 3.9  --     Lipid Panel     Component Value Date/Time   CHOL 192 07/26/2014 0437   TRIG 305* 07/26/2014 0437   HDL 34* 07/26/2014 0437   CHOLHDL 5.6 07/26/2014 0437   VLDL 61* 07/26/2014 0437   LDLCALC 97 07/26/2014 0437    A1c 6.0  MRI (6/8) IMPRESSION: 1. Acute nonhemorrhagic infarct of the right frontal operculum and anterior portion of the insular cortex. 2. Acute punctate infarct in the high right parietal lobe near the vertex. 3. Slow or occluded flow in the right internal carotid artery to the level  of the posterior communicating artery. 4. Age advanced periventricular T2 changes likely reflect the sequela of chronic microvascular ischemia.   Ct Head (brain) Wo Contrast (6/8): IMPRESSION: 1. Anterior division right MCA territory cortically based infarct. ASPECTS score = 8. 2. No associated hemorrhage or mass effect.   Ct Angio Head/Neck W/cm &/or Wo Cm (6/9): IMPRESSION: 1. Right  ICA is occluded at its origin with no reconstituted flow in the neck. Moderate to poor reconstitution of the distal right ICA siphon. Right MCA origin and M1 segment are patent. Single right M2 branch occlusion identified just beyond the bifurcation. 2. No other major circle of Willis branch occlusion. Stenosis versus non dominance of the right ACA A1 segment. 3. Left ICA origin and bulb atherosclerosis without hemodynamically significant stenosis. Up to moderate left vertebral artery V1 segment stenosis. 4. Stable CT appearance of the brain since 07/25/2014. 5. Poor dentition.    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     atorvastatin 40 MG tablet  Commonly known as:  LIPITOR  Take 1 tablet (40 mg total) by mouth daily.     clopidogrel 75 MG tablet  Commonly known as:  PLAVIX  Take 1 tablet (75 mg total) by mouth daily.     ibuprofen 200 MG tablet  Commonly known as:  ADVIL,MOTRIN  Take 400 mg by mouth every 6 (six) hours as needed for moderate pain.     lisinopril-hydrochlorothiazide 20-12.5 MG per tablet  Commonly known as:  PRINZIDE,ZESTORETIC  Take 1 tablet by mouth daily.        Discharge Instructions: Please refer to Patient Instructions section of EMR for full details.  Patient was counseled important signs and symptoms that should prompt return to medical care, changes in medications,  dietary instructions, activity restrictions, and follow up appointments.   Follow-Up Appointments: Follow-up Information    Follow up with [facility] On 08/06/2014.   Why:  at [facility] for wound check appointment   Contact information:   [address] [place] [address]       Follow up with Xu,Jindong, MD In 2 months.   Specialty:  Neurology   Why:  Stroke Clinic, Office will call you with appointment date & time   Contact information:   [address] [place] [address] [phone]       Schedule an appointment as soon as possible for a visit with Dow Adolph, MD.   Specialty:  Family Medicine   Why:  For hospital follow-up   Contact information:   [address] [place] [address] [phone]       Erasmo Downer, MD 07/27/2014, 8:03 PM PGY-1, [facility]

## 2014-07-26 NOTE — Progress Notes (Signed)
STROKE TEAM PROGRESS NOTE   HISTORY [name] is an 59 y.o. male male presenting to the ED as a code stroke. Per wife he was last seen normal at 1100 this am (LKW 07/25/2014). She left to go to the store and when she returned his brother confronted her and stated he was having a stroke. His symptoms at that time was left facial droop and slurred speech. EMS was called and the patient was brought in as a code stroke. Initial NIHSS of 2. Wife reports that patient was not himself all day. He laid around more than usual and was not as active as usual. Modified Rankin: Rankin Score=0. Patient was not administered TPA secondary to symptoms resolving. He was admitted for further evaluation and treatment.   SUBJECTIVE (INTERVAL HISTORY) His wife and daughter are at the bedside.  Overall he feels his condition is stable.    OBJECTIVE Temp:  [97.5 F (36.4 C)-99.1 F (37.3 C)] 97.8 F (36.6 C) (06/09 1000) Pulse Rate:  [55-82] 62 (06/09 1000) Cardiac Rhythm:  [-] Normal sinus rhythm (06/09 0800) Resp:  [15-23] 20 (06/09 1000) BP: (112-177)/(53-92) 119/62 mmHg (06/09 1000) SpO2:  [94 %-98 %] 98 % (06/09 1000) Weight:  [77.001 kg (169 lb 12.1 oz)] 77.001 kg (169 lb 12.1 oz) (06/08 1529)   Recent Labs Lab 07/25/14 1525 07/25/14 2133 07/26/14 0624 07/26/14 0752 07/26/14 1246  GLUCAP 86 150* 94 104* 85    Recent Labs Lab 07/25/14 1531 07/25/14 1537  NA 133* 134*  K 3.9 4.0  CL 101 102  CO2 24  --   GLUCOSE 87 89  BUN 19 22*  CREATININE 0.95 0.90  CALCIUM 9.7  --     Recent Labs Lab 07/25/14 1531  AST 26  ALT 33  ALKPHOS 62  BILITOT 0.6  PROT 7.4  ALBUMIN 3.9    Recent Labs Lab 07/25/14 1531 07/25/14 1537  WBC 12.7*  --   NEUTROABS 7.8*  --   HGB 14.8 15.3  HCT 42.4 45.0  MCV 92.6  --   PLT 283  --    No results for input(s): CKTOTAL, CKMB, CKMBINDEX, TROPONINI in the last 168 hours.  Recent Labs  07/25/14 1531  LABPROT 13.2  INR 0.98   No results  for input(s): COLORURINE, LABSPEC, PHURINE, GLUCOSEU, HGBUR, BILIRUBINUR, KETONESUR, PROTEINUR, UROBILINOGEN, NITRITE, LEUKOCYTESUR in the last 72 hours.  Invalid input(s): APPERANCEUR     Component Value Date/Time   CHOL 192 07/26/2014 0437   TRIG 305* 07/26/2014 0437   HDL 34* 07/26/2014 0437   CHOLHDL 5.6 07/26/2014 0437   VLDL 61* 07/26/2014 0437   LDLCALC 97 07/26/2014 0437   No results found for: HGBA1C No results found for: LABOPIA, COCAINSCRNUR, LABBENZ, AMPHETMU, THCU, LABBARB  No results for input(s): ETH in the last 168 hours.  Ct Head (brain) Wo Contrast 07/25/2014    1. Anterior division right MCA territory cortically based infarct. ASPECTS score = 8. 2. No associated hemorrhage or mass effect.    Mr Brain Wo Contrast 07/25/2014    1. Acute nonhemorrhagic infarct of the right frontal operculum and anterior portion of the insular cortex. 2. Acute punctate infarct in the high right parietal lobe near the vertex. 3. Slow or occluded flow in the right internal carotid artery to the level of the posterior communicating artery. 4. Age advanced periventricular T2 changes likely reflect the sequela of chronic microvascular ischemia.   CTA Head & Neck   1. Right ICA is occluded  at its origin with no reconstituted flow in the neck. Moderate to poor reconstitution of the distal right ICA siphon. Right MCA origin and M1 segment are patent. Single right M2 branch occlusion identified just beyond the bifurcation. 2. No other major circle of Willis branch occlusion. Stenosis versus non dominance of the right ACA A1 segment. 3. Left ICA origin and bulb atherosclerosis without hemodynamically significant stenosis. Up to moderate left vertebral artery V1 segment stenosis. 4. Stable CT appearance of the brain since 07/25/2014. 5. Poor dentition.  LE Venous dopplers No DVT  2D Echocardiogram   - Left ventricle: The cavity size was normal. Systolic function was vigorous. The estimated  ejection fraction was in the range of 65%to 70%. Wall motion was normal; there were no regional wallmotion abnormalities. Doppler parameters are consistent with abnormal left ventricular relaxation (grade 1 diastolicdysfunction). There was no evidence of elevated ventricularfilling pressure by Doppler parameters. - Aortic root: The aortic root was normal in size. - Ascending aorta: The ascending aorta was normal in size. - Mitral valve: There was mild regurgitation. - Left atrium: The atrium was mildly dilated. - Right ventricle: The cavity size was normal. Wall thickness wasnormal. Systolic function was normal. - Right atrium: The atrium was normal in size. - Tricuspid valve: There was mild regurgitation. - Pericardium, extracardiac: The pericardium was normal inappearance.   PHYSICAL EXAM  Temp:  [97.5 F (36.4 C)-98.4 F (36.9 C)] 97.5 F (36.4 C) (06/10 0523) Pulse Rate:  [62-68] 66 (06/10 0523) Resp:  [18-22] 18 (06/10 0523) BP: (112-145)/(62-80) 138/79 mmHg (06/10 0523) SpO2:  [97 %-99 %] 99 % (06/10 0523)  General - Well nourished, well developed, in no apparent distress.  Ophthalmologic - Sharp disc margins OU.  Cardiovascular - Regular rate and rhythm with no murmur.  Mental Status -  Level of arousal and orientation to time, place, and person were intact. Language including expression, naming, repetition, comprehension was assessed and found intact.  Cranial Nerves II - XII - II - Visual field intact OU. III, IV, VI - Extraocular movements intact. V - Facial sensation intact bilaterally. VII - mild left facial droop. VIII - Hearing & vestibular intact bilaterally. X - Palate elevates symmetrically, mild dysarthria. XI - Chin turning & shoulder shrug intact bilaterally. XII - Tongue protrusion intact.  Motor Strength - The patient's strength was normal in all extremities and pronator drift was absent.  Bulk was normal and fasciculations were absent.   Motor  Tone - Muscle tone was assessed at the neck and appendages and was normal.  Reflexes - The patient's reflexes were 1+ in all extremities and he had no pathological reflexes.  Sensory - Light touch, temperature/pinprick were assessed and were symmetrical.    Coordination - The patient had normal movements in the hands and feet with no ataxia or dysmetria.  Tremor was absent.  Gait and Station - deferred due to fatigue.   ASSESSMENT/PLAN [name] is a 59 y.o. male with history of hypertension and hyperlipidemia presenting with facial weakness and dysarthria. He did not receive IV t-PA due to resolving symptoms.   Stroke:  Non-dominant right R frontal/insular cortex and high R parietal infarcts which appear embolic, source unknown, could be due to right ICA occlusion / large vessel athero but cardioembolic not able to rule out  MRI  R frontal/insular cortex and high R parietal infarcts. slow flow R ICA  CTA head and neck right ICA occlusion at origin, bilateral ICA cavernous stenosis  2D  Echo  No source of embolus   LE venous Dopplers no DVT  TEE to look for embolic source. Arranged with [facility] for tomorrow.  (I have made patient NPO after midnight tonight).   If TEE negative, a [facility] electrophysiologist will consult and consider placement of an implantable loop recorder to evaluate for atrial fibrillation as etiology of stroke. This has been explained to patient/family by Dr. Roda Shutters and they are agreeable.   Hypercoagulable and autoimmune labs also to look for stroke etiology.   LDL 97  HgbA1c pending  Lovenox 40 mg sq daily for VTE prophylaxis Diet Heart Room service appropriate?: Yes; Fluid consistency:: Thin  no antithrombotic prior to admission, now on aspirin 325 mg orally every day. Recommend dural antiplatelet for 3 months and then plavix alone due to intracranial stenosis.   Patient counseled to be compliant with  his antithrombotic medications  Ongoing aggressive stroke risk factor management  Therapy recommendations:  pending   Disposition:  pending   Hypertension  Home meds:   linopril  Stable  Avoid hypotension due to right ICA occlusion  BP goal 130-150  Hyperlipidemia  Home meds:  lipitor 40 mg daily,  resumed in hospital  LDL 97, goal < 70  Continue statin at discharge  Tobacco abuse  Current smoker  Smoking cessation counseling provided  Pt is willing to quit  Other Stroke Risk Factors  ETOH use  Hospital day # 1  [facility] See Amion for Pager information 07/26/2014 2:21 PM   59 yo M with hx of HTN, HLD, smoker and alcohol abuse admitted for right MCA embolic stroke. Work up found to have right ICA occlusion on CTA. According to history and imaging findings, this could be due to large vessel athro but cardioembolic not able to ruled out. Still recommend TEE and loop, but control stroke risk factor aggressively. Recommend dural antiplatelet for 3 months and then plavix alone. Continue lipitor.   Marvel Plan, MD PhD Stroke Neurology 07/27/2014 5:56 AM   To contact Stroke Continuity provider, please refer to [email or web address]. After hours, contact General Neurology

## 2014-07-26 NOTE — Progress Notes (Signed)
PT Cancellation Note  Patient Details Name: [name] MRN: [id number] DOB: 13-Mar-1955   Cancelled Treatment:    Reason Eval/Treat Not Completed: Medical issues which prohibited therapy Patient remains on bed rest order. Also noted neurology recommending LE venous dopplers to rule out DVT. Per departmental protocol, will hold comprehensive physical therapy evaluation at this time.  [name] 07/26/2014, 3:09 PM [name], [place] [phone]

## 2014-07-26 NOTE — Consult Note (Signed)
ELECTROPHYSIOLOGY CONSULT NOTE  Patient ID: [name] MRN: [id number], DOB/AGE: [id number]   Admit date: 07/25/2014 Date of Consult: 07/27/2014  Primary Physician: Dow Adolph, MD Primary Cardiologist: new to [facility] Reason for Consultation: Cryptogenic stroke; recommendations regarding Implantable Loop Recorder  History of Present Illness [name] was admitted on 07/25/2014 with facial droop and slurred speech.  Imaging demonstrated non dominant right frontal/insular cortex and high right parietal infarcts embolic in nature.  He has undergone workup for stroke including echocardiogram and LE venous dopplers.  CTA of the neck demonstrated occluded right ICA.  The patient has been monitored on telemetry which has demonstrated sinus rhythm with no arrhythmias.  Inpatient stroke work-up is to be completed with a TEE.   Echocardiogram this admission demonstrated EF 65-70%, no RWMA, grade 1 diastolic dysfunction, mild MR, LA 32.  Lab work is reviewed.  Prior to admission, the patient denies chest pain, shortness of breath, dizziness, palpitations, or syncope.  They are recovering from their stroke with plans to return home at discharge.  EP has been asked to evaluate for placement of an implantable loop recorder to monitor for atrial fibrillation.  ROS is negative except as outlined above.    Past Medical History  Diagnosis Date  . Arthritis   . Hypertension   . Hyperlipidemia      Surgical History: History reviewed. No pertinent past surgical history.   Prescriptions prior to admission  Medication Sig Dispense Refill Last Dose  . atorvastatin (LIPITOR) 40 MG tablet Take 1 tablet (40 mg total) by mouth daily. 30 tablet 5 07/25/2014 at Unknown time  . ibuprofen (ADVIL,MOTRIN) 200 MG tablet Take 400 mg by mouth every 6 (six) hours as needed for moderate pain.   Past Week at Unknown time  . lisinopril-hydrochlorothiazide (PRINZIDE,ZESTORETIC) 20-12.5 MG per tablet Take 1  tablet by mouth daily. 30 tablet 5 07/25/2014 at Unknown time    Inpatient Medications:  .  stroke: mapping our early stages of recovery book   Does not apply Once  . aspirin EC  81 mg Oral Daily  . atorvastatin  40 mg Oral Daily  . clopidogrel  75 mg Oral Daily  . enoxaparin (LOVENOX) injection  40 mg Subcutaneous Q24H  . folic acid  1 mg Oral Daily  . multivitamin with minerals  1 tablet Oral Daily  . nicotine  21 mg Transdermal Daily  . thiamine  100 mg Oral Daily    Allergies: No Known Allergies  History   Social History  . Marital Status: Married    Spouse Name: N/A  . Number of Children: N/A  . Years of Education: N/A   Occupational History  . Not on file.   Social History Main Topics  . Smoking status: Current Every Day Smoker -- 0.50 packs/day for 40 years    Types: Cigarettes  . Smokeless tobacco: Not on file  . Alcohol Use: 10.8 oz/week    18 Cans of beer per week  . Drug Use: No  . Sexual Activity: Yes   Other Topics Concern  . Not on file   Social History Narrative     Family History  Problem Relation Age of Onset  . Cancer Father   . Heart attack Brother      Physical Exam: Filed Vitals:   07/26/14 1700 07/26/14 2213 07/27/14 0153 07/27/14 0523  BP: 124/66 135/80 145/80 138/79  Pulse: 67 68 66 66  Temp: 97.9 F (36.6 C) 98.4 F (36.9 C) 97.9 F (  36.6 C) 97.5 F (36.4 C)  TempSrc: Oral Oral Oral Oral  Resp: 20 18 19 18   Height:      Weight:      SpO2: 97% 98% 97% 99%    GEN- The patient is well appearing, alert and oriented x 3 today.   Head- normocephalic, atraumatic Eyes-  Sclera clear, conjunctiva pink Ears- hearing intact Oropharynx- clear Neck- supple, Lungs- Clear to ausculation bilaterally, normal work of breathing Heart- Regular rate and rhythm, no murmurs, rubs or gallops, PMI not laterally displaced GI- soft, NT, ND, + BS Extremities- no clubbing, cyanosis, or edema MS- no significant deformity or atrophy Skin- no rash  or lesion Psych- euthymic mood, full affect   Labs:   Lab Results  Component Value Date   WBC 12.7* 07/25/2014   HGB 15.3 07/25/2014   HCT 45.0 07/25/2014   MCV 92.6 07/25/2014   PLT 283 07/25/2014     Recent Labs Lab 07/25/14 1531 07/25/14 1537  NA 133* 134*  K 3.9 4.0  CL 101 102  CO2 24  --   BUN 19 22*  CREATININE 0.95 0.90  CALCIUM 9.7  --   PROT 7.4  --   BILITOT 0.6  --   ALKPHOS 62  --   ALT 33  --   AST 26  --   GLUCOSE 87 89     Radiology/Studies:  07/25/2014   CLINICAL DATA:  59 year old male code stroke. Slurred speech since 1400 hours. Initial encounter.  EXAM: CT HEAD WITHOUT CONTRAST  TECHNIQUE: Contiguous axial images were obtained from the base of the skull through the vertex without intravenous contrast.  COMPARISON:  Neck CT 05/21/2013.  FINDINGS: Minor paranasal sinus mucosal thickening. No acute osseous abnormality identified. Visualized orbit soft tissues are within normal limits. Visualized scalp soft tissues are within normal limits.  Calcified atherosclerosis at the skull base. No suspicious intracranial vascular hyperdensity.  However, there is abnormal gray and white matter hypodensity at the anterior right operculum (series 2, image 16). No associated hemorrhage or mass effect. ASPECTS score = 8.  Gray-white matter differentiation elsewhere is within normal limits. Incidental mega cisterna magna/right posterior fossa arachnoid cyst. No ventriculomegaly. No acute intracranial hemorrhage identified. No midline shift, mass effect, or evidence of intracranial mass lesion.  IMPRESSION: 1. Anterior division right MCA territory cortically based infarct. ASPECTS score = 8. 2. No associated hemorrhage or mass effect.  Electronically Signed: By: Odessa Fleming M.D. On: 07/25/2014 15:32   Mr Brain Wo Contrast 07/25/2014   CLINICAL DATA:  Left facial droop and slurred speech. Last seen well at 11 o'clock a.m. today.  EXAM: MRI HEAD WITHOUT CONTRAST  TECHNIQUE: Multiplanar,  multiecho pulse sequences of the brain and surrounding structures were obtained without intravenous contrast.  COMPARISON:  CT head without contrast 07/25/2014.  FINDINGS: The diffusion-weighted images confirm an acute nonhemorrhagic infarct involving anterior right frontal operculum. There is restricted diffusion anteriorly in the insular cortex. A punctate focus of restricted diffusion is also noted within the high right parietal lobe near the vertex.  A posterior fossa arachnoid cyst is noted. Moderate periventricular white matter changes are seen bilaterally. T2 changes are associated with the areas of restricted diffusion  Right internal carotid artery is occluded to lease level of the posterior communicating artery. Flow is present in the left internal carotid artery. The basilar artery is patent.  The globes and orbits are intact. Mild mucosal thickening is noted in the anterior ethmoid air cells. The remaining  paranasal sinuses are clear. Bilateral mastoid effusions are noted.  IMPRESSION: 1. Acute nonhemorrhagic infarct of the right frontal operculum and anterior portion of the insular cortex. 2. Acute punctate infarct in the high right parietal lobe near the vertex. 3. Slow or occluded flow in the right internal carotid artery to the level of the posterior communicating artery. 4. Age advanced periventricular T2 changes likely reflect the sequela of chronic microvascular ischemia. These results were called by telephone at the time of interpretation on 07/25/2014 at 6:23 pm to Dr. Thana Farr , who verbally acknowledged these results.   Electronically Signed   By: Marin Roberts M.D.   On: 07/25/2014 18:23    12-lead ECG sinus rhythm, rate 78, normal intervals   Telemetry sinus rhythm  Assessment and Plan:  1. Cryptogenic stroke The patient presents with cryptogenic stroke.  The patient has a TEE planned for later today. CTA of the neck demonstrated occluded right ICA.  Neurology feels  that this is related to embolic event and not stenosis.  I spoke at length with the patient about monitoring for afib with either a 30 day event monitor or an implantable loop recorder.  Risks, benefits, and alteratives to implantable loop recorder were discussed with the patient today.   At this time, the patient is very clear in their decision to proceed with implantable loop recorder.   Wound care was reviewed with the patient (keep incision clean and dry for 3 days).  Wound check scheduled for 08/06/14 at [facility]  Please call with questions.   Gypsy Balsam, NP 07/27/2014 9:33 AM   EP Attending Patient seen and examined. I agree with the history, exam, lab and assessment and plan as noted by Gypsy Balsam, NP-C. He has a cryptogenic stroke. 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 Rennis Golden which would not cause the stroke. No PFO. Will plan to proceed with ILR insertion.  Leonia Reeves.D.

## 2014-07-26 NOTE — Progress Notes (Addendum)
Echocardiogram 2D Echocardiogram with Definity has been performed.  [name] 07/26/2014, 12:25 PM

## 2014-07-26 NOTE — Progress Notes (Signed)
VASCULAR LAB PRELIMINARY  PRELIMINARY  PRELIMINARY  PRELIMINARY  Bilateral lower extremity venous duplex completed.    Preliminary report:  Bilateral:  No evidence of DVT, superficial thrombosis, or Baker's Cyst.  Kynisha Memon, RVS 07/26/2014, 4:01 PM

## 2014-07-26 NOTE — Progress Notes (Signed)
    [facility] has been requested to perform a transesophageal echocardiogram on [name], [name] for stroke workup.  After careful review of history and examination, the risks and benefits of transesophageal echocardiogram have been explained including risks of esophageal damage, perforation (1:10,000 risk), bleeding, pharyngeal hematoma as well as other potential complications associated with conscious sedation including aspiration, arrhythmia, respiratory failure and death. Alternatives to treatment were discussed, questions were answered. Patient is willing to proceed.  TEE - Dr. Rennis Golden @ 1300, 07/27/14 . NPO after midnight. Meds with sips.   Manson Passey, PA-C 07/26/2014 5:36 PM

## 2014-07-27 ENCOUNTER — Inpatient Hospital Stay (HOSPITAL_COMMUNITY)
Admit: 2014-07-27 | Discharge: 2014-07-27 | Disposition: A | Discharge status: Home / Self Care | Payer: BLUE CROSS/BLUE SHIELD | Attending: Physician Assistant | Admitting: Physician Assistant

## 2014-07-27 ENCOUNTER — Encounter (HOSPITAL_COMMUNITY)
Admission: EM | Disposition: A | Discharge status: Rehabilitation Facility | Payer: Self-pay | Source: Home / Self Care | Attending: Family Medicine

## 2014-07-27 ENCOUNTER — Encounter (HOSPITAL_COMMUNITY): Payer: Self-pay | Admitting: *Deleted

## 2014-07-27 DIAGNOSIS — I63239 Cerebral infarction due to unspecified occlusion or stenosis of unspecified carotid arteries: Secondary | ICD-10-CM | POA: Insufficient documentation

## 2014-07-27 DIAGNOSIS — I639 Cerebral infarction, unspecified: Secondary | ICD-10-CM

## 2014-07-27 DIAGNOSIS — F1010 Alcohol abuse, uncomplicated: Secondary | ICD-10-CM

## 2014-07-27 DIAGNOSIS — I63131 Cerebral infarction due to embolism of right carotid artery: Secondary | ICD-10-CM

## 2014-07-27 DIAGNOSIS — I6350 Cerebral infarction due to unspecified occlusion or stenosis of unspecified cerebral artery: Secondary | ICD-10-CM

## 2014-07-27 DIAGNOSIS — F1011 Alcohol abuse, in remission: Secondary | ICD-10-CM | POA: Insufficient documentation

## 2014-07-27 HISTORY — PX: LOOP RECORDER IMPLANT: SHX5954

## 2014-07-27 HISTORY — PX: EP IMPLANTABLE DEVICE: SHX172B

## 2014-07-27 HISTORY — PX: TEE WITHOUT CARDIOVERSION: SHX5443

## 2014-07-27 LAB — HEMOGLOBIN A1C
HEMOGLOBIN A1C: 6 % — AB (ref 4.8–5.6)
Mean Plasma Glucose: 126 mg/dL

## 2014-07-27 LAB — GLUCOSE, CAPILLARY
GLUCOSE-CAPILLARY: 94 mg/dL (ref 65–99)
GLUCOSE-CAPILLARY: 73 mg/dL (ref 65–99)
Glucose-Capillary: 97 mg/dL (ref 65–99)

## 2014-07-27 SURGERY — ECHOCARDIOGRAM, TRANSESOPHAGEAL
Anesthesia: Moderate Sedation

## 2014-07-27 SURGERY — LOOP RECORDER INSERTION

## 2014-07-27 MED ORDER — BUTAMBEN-TETRACAINE-BENZOCAINE 2-2-14 % EX AERO
INHALATION_SPRAY | CUTANEOUS | Status: DC | PRN
  Administered 2014-07-27: 2 via TOPICAL

## 2014-07-27 MED ORDER — ASPIRIN EC 81 MG PO TBEC: 81.0000 mg | DELAYED_RELEASE_TABLET | Freq: Every day | ORAL | Status: DC

## 2014-07-27 MED ORDER — DIPHENHYDRAMINE HCL 50 MG/ML IJ SOLN
INTRAMUSCULAR | Status: AC
  Filled 2014-07-27: qty 1

## 2014-07-27 MED ORDER — ASPIRIN 81 MG PO TBEC
81.0000 mg | DELAYED_RELEASE_TABLET | Freq: Every day | ORAL | Status: DC
Start: 2014-07-27 — End: 2014-10-03

## 2014-07-27 MED ORDER — SODIUM CHLORIDE 0.9 % IV SOLN: INTRAVENOUS | Status: DC

## 2014-07-27 MED ORDER — MIDAZOLAM HCL 10 MG/2ML IJ SOLN
INTRAMUSCULAR | Status: DC | PRN
  Administered 2014-07-27: 1 mg via INTRAVENOUS
  Administered 2014-07-27: 2 mg via INTRAVENOUS
  Administered 2014-07-27: 1 mg via INTRAVENOUS

## 2014-07-27 MED ORDER — SODIUM CHLORIDE 0.9 % IV SOLN
INTRAVENOUS | Status: DC
  Administered 2014-07-27: 500 mL via INTRAVENOUS

## 2014-07-27 MED ORDER — MIDAZOLAM HCL 5 MG/ML IJ SOLN
INTRAMUSCULAR | Status: AC
  Filled 2014-07-27: qty 2

## 2014-07-27 MED ORDER — FENTANYL CITRATE (PF) 100 MCG/2ML IJ SOLN
INTRAMUSCULAR | Status: DC | PRN
  Administered 2014-07-27 (×2): 25 ug via INTRAVENOUS

## 2014-07-27 MED ORDER — CLOPIDOGREL BISULFATE 75 MG PO TABS: 75.0000 mg | ORAL_TABLET | Freq: Every day | ORAL | Status: DC

## 2014-07-27 MED ORDER — LIDOCAINE-EPINEPHRINE 1 %-1:100000 IJ SOLN
INTRAMUSCULAR | Status: AC
  Filled 2014-07-27: qty 1

## 2014-07-27 MED ORDER — FENTANYL CITRATE (PF) 100 MCG/2ML IJ SOLN
INTRAMUSCULAR | Status: AC
  Filled 2014-07-27: qty 2

## 2014-07-27 SURGICAL SUPPLY — 2 items
LOOP REVEAL LINQSYS (Prosthesis & Implant Heart) ×3 IMPLANT
PACK LOOP INSERTION (CUSTOM PROCEDURE TRAY) ×3 IMPLANT

## 2014-07-27 NOTE — Clinical Social Work Note (Signed)
CSW consult acknowledged:  Clinical Child psychotherapist received a consult for ETOH. CSW to complete psychosocial assessment and S-BIRT with patient at bedside.  Derenda Fennel, MSW, LCSWA [phone] 07/27/2014 3:40 PM

## 2014-07-27 NOTE — Progress Notes (Signed)
STROKE TEAM PROGRESS NOTE   HISTORY [name] is an 59 y.o. male male presenting to the ED as a code stroke. Per wife he was last seen normal at 1100 this am (LKW 07/25/2014). She left to go to the store and when she returned his brother confronted her and stated he was having a stroke. His symptoms at that time was left facial droop and slurred speech. EMS was called and the patient was brought in as a code stroke. Initial NIHSS of 2. Wife reports that patient was not himself all day. He laid around more than usual and was not as active as usual. Modified Rankin: Rankin Score=0. Patient was not administered TPA secondary to symptoms resolving. He was admitted for further evaluation and treatment.   SUBJECTIVE (INTERVAL HISTORY) His mother, wife and daughter are at the bedside. Plans for TEE today at 1p. Tests reasoning and purpose discussed with them. Therapists have worked with him. No new complaints over night.    OBJECTIVE Temp:  [97.5 F (36.4 C)-98.4 F (36.9 C)] 97.5 F (36.4 C) (06/10 0523) Pulse Rate:  [62-68] 66 (06/10 0523) Cardiac Rhythm:  [-] Normal sinus rhythm (06/09 2000) Resp:  [18-22] 18 (06/10 0523) BP: (119-145)/(62-80) 138/79 mmHg (06/10 0523) SpO2:  [97 %-99 %] 99 % (06/10 0523)   Recent Labs Lab 07/26/14 1246 07/26/14 1631 07/26/14 2215 07/27/14 0639 07/27/14 0830  GLUCAP 85 111* 130* 94 97    Recent Labs Lab 07/25/14 1531 07/25/14 1537  NA 133* 134*  K 3.9 4.0  CL 101 102  CO2 24  --   GLUCOSE 87 89  BUN 19 22*  CREATININE 0.95 0.90  CALCIUM 9.7  --     Recent Labs Lab 07/25/14 1531  AST 26  ALT 33  ALKPHOS 62  BILITOT 0.6  PROT 7.4  ALBUMIN 3.9    Recent Labs Lab 07/25/14 1531 07/25/14 1537  WBC 12.7*  --   NEUTROABS 7.8*  --   HGB 14.8 15.3  HCT 42.4 45.0  MCV 92.6  --   PLT 283  --    No results for input(s): CKTOTAL, CKMB, CKMBINDEX, TROPONINI in the last 168 hours.  Recent Labs  07/25/14 1531  LABPROT 13.2   INR 0.98   No results for input(s): COLORURINE, LABSPEC, PHURINE, GLUCOSEU, HGBUR, BILIRUBINUR, KETONESUR, PROTEINUR, UROBILINOGEN, NITRITE, LEUKOCYTESUR in the last 72 hours.  Invalid input(s): APPERANCEUR     Component Value Date/Time   CHOL 192 07/26/2014 0437   TRIG 305* 07/26/2014 0437   HDL 34* 07/26/2014 0437   CHOLHDL 5.6 07/26/2014 0437   VLDL 61* 07/26/2014 0437   LDLCALC 97 07/26/2014 0437   Lab Results  Component Value Date   HGBA1C 6.0* 07/26/2014      Component Value Date/Time   LABOPIA NONE DETECTED 07/26/2014 1445   COCAINSCRNUR NONE DETECTED 07/26/2014 1445   LABBENZ NONE DETECTED 07/26/2014 1445   AMPHETMU NONE DETECTED 07/26/2014 1445   THCU NONE DETECTED 07/26/2014 1445   LABBARB NONE DETECTED 07/26/2014 1445    No results for input(s): ETH in the last 168 hours.  Ct Head (brain) Wo Contrast 07/25/2014    1. Anterior division right MCA territory cortically based infarct. ASPECTS score = 8. 2. No associated hemorrhage or mass effect.    Mr Brain Wo Contrast 07/25/2014    1. Acute nonhemorrhagic infarct of the right frontal operculum and anterior portion of the insular cortex. 2. Acute punctate infarct in the high right parietal lobe near  the vertex. 3. Slow or occluded flow in the right internal carotid artery to the level of the posterior communicating artery. 4. Age advanced periventricular T2 changes likely reflect the sequela of chronic microvascular ischemia.   CTA Head & Neck  1. Right ICA is occluded at its origin with no reconstituted flow in the neck. Moderate to poor reconstitution of the distal right ICA siphon. Right MCA origin and M1 segment are patent. Single right M2 branch occlusion identified just beyond the bifurcation. 2. No other major circle of Willis branch occlusion. Stenosis versus non dominance of the right ACA A1 segment. 3. Left ICA origin and bulb atherosclerosis without hemodynamically significant stenosis. Up to moderate left  vertebral artery V1 segment stenosis. 4. Stable CT appearance of the brain since 07/25/2014. 5. Poor dentition.  LE Venous dopplers No DVT  2D Echocardiogram   - Left ventricle: The cavity size was normal. Systolic function was vigorous. The estimated ejection fraction was in the range of 65%to 70%. Wall motion was normal; there were no regional wallmotion abnormalities. Doppler parameters are consistent with abnormal left ventricular relaxation (grade 1 diastolicdysfunction). There was no evidence of elevated ventricularfilling pressure by Doppler parameters. - Aortic root: The aortic root was normal in size. - Ascending aorta: The ascending aorta was normal in size. - Mitral valve: There was mild regurgitation. - Left atrium: The atrium was mildly dilated. - Right ventricle: The cavity size was normal. Wall thickness wasnormal. Systolic function was normal. - Right atrium: The atrium was normal in size. - Tricuspid valve: There was mild regurgitation. - Pericardium, extracardiac: The pericardium was normal inappearance.  TEE  1. No LAA thrombus 2. Negative for PFO 3. 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 LVEF 55-60% 5. Grade 2 aortic atherosclerosis of the distal aortic arch and proximal descending aorta   PHYSICAL EXAM Temp:  [97.5 F (36.4 C)-98.4 F (36.9 C)] 97.5 F (36.4 C) (06/10 0523) Pulse Rate:  [62-68] 66 (06/10 0523) Resp:  [18-22] 18 (06/10 0523) BP: (119-145)/(62-80) 138/79 mmHg (06/10 0523) SpO2:  [97 %-99 %] 99 % (06/10 0523)  General - Well nourished, well developed, in no apparent distress.  Ophthalmologic - Sharp disc margins OU.  Cardiovascular - Regular rate and rhythm with no murmur.  Mental Status -  Level of arousal and orientation to time, place, and person were intact. Language including expression, naming,  repetition, comprehension was assessed and found intact.  Cranial Nerves II - XII - II - Visual field intact OU. III, IV, VI - Extraocular movements intact. V - Facial sensation intact bilaterally. VII - mild left facial droop. VIII - Hearing & vestibular intact bilaterally. X - Palate elevates symmetrically. XI - Chin turning & shoulder shrug intact bilaterally. XII - Tongue protrusion intact.  Motor Strength - The patient's strength was normal in all extremities and pronator drift was absent.  Bulk was normal and fasciculations were absent.   Motor Tone - Muscle tone was assessed at the neck and appendages and was normal.  Reflexes - The patient's reflexes were 1+ in all extremities and he had no pathological reflexes.  Sensory - Light touch, temperature/pinprick were assessed and were symmetrical.    Coordination - The patient had normal movements in the hands and feet with no ataxia or dysmetria.  Tremor was absent.  Gait and Station - deferred due to fatigue.   ASSESSMENT/PLAN Mr.  [name] is a 59 y.o. male with history of hypertension and hyperlipidemia presenting with facial weakness and dysarthria. He did not receive IV t-PA due to resolving symptoms.   Stroke:  Non-dominant right R frontal/insular cortex and high R parietal infarcts which appear embolic, source unknown, but likely due to right ICA occlusion either from large vessel athero or from afib.  Given the right ICA did not seem to be highly calcified on CTA, cardiac source of clot is  possible.   MRI  R frontal/insular cortex and high R parietal infarcts. slow flow R ICA  CTA head and neck right ICA occlusion at origin, bilateral ICA cavernous stenosis  2D Echo  No source of embolus   LE venous Dopplers no DVT  TEE no PFO, EF 55-60% with grade II aortic atherso.   Loop recorder recommended.  Hypercoagulable and autoimmune labs also to look for stroke etiology. Drawn, results pending.  LDL 97, not at  goal  HgbA1c 6.0  Lovenox 40 mg sq daily for VTE prophylaxis Diet NPO time specified  no antithrombotic prior to admission, now on aspirin 81 mg orally every day and clopidogrel 75 mg orally every day. Recommend dural antiplatelet for 3 months and then plavix alone due to intracranial stenosis.   Patient counseled to be compliant with his antithrombotic medications  Ongoing aggressive stroke risk factor management  Therapy recommendations:  No anticipated therapy needs. eval pending   Disposition:  Anticipate return home  [facility] for discharge following TEE and loop from stroke perspective  Follow up Dr. Roda Shutters, stroke clinic, in 2 months. Order written  Hypertension  Home meds:   lisinopril  Stable  Avoid hypotension due to right ICA occlusion  BP goal 130-150  Hyperlipidemia  Home meds:  lipitor 40 mg daily,  resumed in hospital  LDL 97, goal < 70  Continue statin at discharge  Tobacco abuse  Current smoker  Smoking cessation counseling provided, again  Pt is willing to quit  Other Stroke Risk Factors  ETOH use. Patient advised only 2 drinks/day. Defined drink for pt and family.  Works in a [employer], product/process development. Wants to go back to work. Dr. Roda Shutters discussed return to work in 2-4 weeks, and gradually increase work load, avoiding heavy lifting.  Hospital day # 2  Rhoderick Moody [facility] See Amion for Pager information 07/27/2014 9:57 AM   I, the attending vascular neurologist, have personally obtained a history, examined the patient, evaluated laboratory data, individually viewed imaging studies and agree with radiology interpretations. I obtained additional history from pt's wife at bedside. Together with the NP/PA, we formulated the assessment and plan of care which reflects our mutual decision.  I have made any additions or clarifications directly to the above note and agree with the findings and plan as currently documented.   59 yo M with hx of HTN, HLD,  smoker and alcohol abuse admitted for right MCA embolic stroke. Work up found to have right ICA occlusion on CTA. According to history and imaging findings, this could be due to large vessel athro but cardioembolic not able to ruled out especially not much calcification at right ICA on CTA images. TEE showed no PFO, recommend loop, but control stroke risk factor aggressively. Recommend dural antiplatelet for 3 months and then plavix alone. Continue lipitor.   Neurology will sign off. Please call with questions. Pt will follow up with Dr. Roda Shutters at [facility] in about 2 months. Thanks for the consult.  Marvel Plan,  MD PhD Stroke Neurology 07/27/2014 3:36 PM  To contact Stroke Continuity provider, please refer to [email or web address]. After hours, contact General Neurology

## 2014-07-27 NOTE — Progress Notes (Signed)
Pt returned from TEE/ Loop with no noted distress. Denied pain or discomfort. Dressing dry and intact old blood noted. Will continue to monitor.

## 2014-07-27 NOTE — Progress Notes (Signed)
Family Medicine Teaching Service Daily Progress Note Intern Pager: [phone]  Patient name: [name] Medical record number: [id number] Date of birth: [birth year] Age: 59 y.o. Gender: male  Primary Care Provider: Dow Adolph, MD Consultants: neurology Code Status: full code  Pt Overview and Major Events to Date:  6/8- admit to FPTS for R MCA Stroke  Assessment and Plan: [name] is a 59 y.o. male presenting with slurred speech/dysarthria with R frontal cortex CVA . PMH is significant for HTN, HLD, tobacco and alcohol abuse.   Acute embolic stroke: P/w sudden onset L facial droop since ~1pm. Symptoms improving since being in ED, so not a TPA candidate. Neurology consulted by ED. CT shows infarct of anterior R MCA with no associated hemorrhage or mass effect and confirmed on MRI along with infarcts to insular cortex and parietal lobe. Could be 2/2 R ICA occlusion but must r/o cardioembolic source - Risk strat labs: A1c 6.0, lipid panel with LDL 97 - PT/OT/SLP consults - Echocardiogram - EF 65-70%, normal wall motion, grade 1 diastolic dysfunction - CTA head/neck - R ICA occlusion - Aspirin  daily and plavix  daily - telemetry monitoring - no events - TEE today with cardiology, loop recorder implant if TEE negative - neurology consulting, appreciate recs  Alcohol abuse: Patient reports drinking 6-18 beers a day. - monitor on CIWA protocol - scoring 0 - SW c/s for cessation counseling  HTN: On lisinopril-HCTZ 20-12.5mg  daily at home. BP in goal range given ICA occlusion. - holding home antihypertensives currently.  HLD: LDL 97 - Continue home atorvastatin  daily  Hyponatremia: Na 134 on admission. - Continue to monitor  Tobacco Abuse: Smokes 1PPD x30 yrs - Encourage cessation - nicotine patch  FEN/GI: Heart healthy diet, SLIV Prophylaxis: lovenox  Disposition: floor status, d/c pending completion of stroke work-up  Subjective:  Reports that dysarthria  and facial droop seem to be improving.  Wants updates on his   Objective: Temp:  [97.5 F (36.4 C)-98.4 F (36.9 C)] 97.5 F (36.4 C) (06/10 0523) Pulse Rate:  [62-68] 66 (06/10 0523) Resp:  [18-22] 18 (06/10 0523) BP: (119-145)/(62-80) 138/79 mmHg (06/10 0523) SpO2:  [97 %-99 %] 99 % (06/10 0523) Physical Exam: General: NAD, laying comfortably in bed HEENT: [place]/AT, MMM, PERRLA, EOMI B/L  Cardiovascular: RRR, no murmurs appreciated  Respiratory: CTAB, normal WOB Abdomen: S/NT/ND, NABS Extremities: No edema, WWP  Skin: No rashes  Neuro: AAO x3, dysarthria improving, fluent, slight L facial drop, CN otherwise intact, strength and sensation grossly intact, FNF intact  Laboratory:  Recent Labs Lab 07/25/14 1531 07/25/14 1537  WBC 12.7*  --   HGB 14.8 15.3  HCT 42.4 45.0  PLT 283  --     Recent Labs Lab 07/25/14 1531 07/25/14 1537  NA 133* 134*  K 3.9 4.0  CL 101 102  CO2 24  --   BUN 19 22*  CREATININE 0.95 0.90  CALCIUM 9.7  --   PROT 7.4  --   BILITOT 0.6  --   ALKPHOS 62  --   ALT 33  --   AST 26  --   GLUCOSE 87 89    Lipid Panel     Component Value Date/Time   CHOL 192 07/26/2014 0437   TRIG 305* 07/26/2014 0437   HDL 34* 07/26/2014 0437   CHOLHDL 5.6 07/26/2014 0437   VLDL 61* 07/26/2014 0437   LDLCALC 97 07/26/2014 0437    A1c 6.0  Imaging/Diagnostic Tests: MRI (6/8) IMPRESSION: 1.  Acute nonhemorrhagic infarct of the right frontal operculum and anterior portion of the insular cortex. 2. Acute punctate infarct in the high right parietal lobe near the vertex. 3. Slow or occluded flow in the right internal carotid artery to the level of the posterior communicating artery. 4. Age advanced periventricular T2 changes likely reflect the sequela of chronic microvascular ischemia.   Ct Head (brain) Wo Contrast (6/8): IMPRESSION: 1. Anterior division right MCA territory cortically based infarct. ASPECTS score = 8. 2. No associated hemorrhage  or mass effect.   EKG: NSR, nonischemic  Echo (6/9): EF 65-70%, normal wall motion, grade 1 diastolic dysfunction  Erasmo Downer, MD 07/27/2014, 7:28 AM PGY-1, [facility] Family Medicine FPTS Intern pager: [phone], text pages welcome

## 2014-07-27 NOTE — Discharge Instructions (Signed)
You were admitted for stroke.  This was most likely because of plaques in your carotid artery in your neck.  You should keep taking a baby aspirin and plavix everyday.  It is important to stop smoking and stop drinking alcohol to decrease risk of stroke in the future.    STROKE/TIA DISCHARGE INSTRUCTIONS SMOKING Cigarette smoking nearly doubles your risk of having a stroke & is the single most alterable risk factor  If you smoke or have smoked in the last 12 months, you are advised to quit smoking for your health.  Most of the excess cardiovascular risk related to smoking disappears within a year of stopping.  Ask you doctor about anti-smoking medications  [place] Quit Line: 1-800-QUIT NOW  Free Smoking Cessation Classes [phone]  CHOLESTEROL Know your levels; limit fat & cholesterol in your diet  Lipid Panel     Component Value Date/Time   CHOL 192 07/26/2014 0437   TRIG 305* 07/26/2014 0437   HDL 34* 07/26/2014 0437   CHOLHDL 5.6 07/26/2014 0437   VLDL 61* 07/26/2014 0437   LDLCALC 97 07/26/2014 0437      Many patients benefit from treatment even if their cholesterol is at goal.  Goal: Total Cholesterol (CHOL) less than 160  Goal:  Triglycerides (TRIG) less than 150  Goal:  HDL greater than 40  Goal:  LDL (LDLCALC) less than 100   BLOOD PRESSURE American Stroke Association blood pressure target is less that 120/80 mm/Hg  Your discharge blood pressure is:  BP: (!) 148/82 mmHg  Monitor your blood pressure  Limit your salt and alcohol intake  Many individuals will require more than one medication for high blood pressure  DIABETES (A1c is a blood sugar average for last 3 months) Goal HGBA1c is under 7% (HBGA1c is blood sugar average for last 3 months)  Diabetes: No known diagnosis of diabetes    Lab Results  Component Value Date   HGBA1C 6.0* 07/26/2014     Your HGBA1c can be lowered with medications, healthy diet, and exercise.  Check your blood sugar as directed  by your physician  Call your physician if you experience unexplained or low blood sugars.  PHYSICAL ACTIVITY/REHABILITATION Goal is 30 minutes at least 4 days per week  Activity: No restrictions. Therapies: none Return to work: when able  Activity decreases your risk of heart attack and stroke and makes your heart stronger.  It helps control your weight and blood pressure; helps you relax and can improve your mood.  Participate in a regular exercise program.  Talk with your doctor about the best form of exercise for you (dancing, walking, swimming, cycling).  DIET/WEIGHT Goal is to maintain a healthy weight  Your discharge diet is: Diet heart healthy/carb modified Room service appropriate?: Yes; Fluid consistency:: Thin  liquids Your height is:  Height: 5\' 6"  (167.6 cm) Your current weight is: Weight: 169 lb 12.1 oz (77.001 kg) Your Body Mass Index (BMI) is:  BMI (Calculated): 27.5  Following the type of diet specifically designed for you will help prevent another stroke.  Your goal Body Mass Index (BMI) is 19-24.  Healthy food habits can help reduce 3 risk factors for stroke:  High cholesterol, hypertension, and excess weight.  RESOURCES Stroke/Support Group:  Call [phone]   STROKE EDUCATION PROVIDED/REVIEWED AND GIVEN TO PATIENT Stroke warning signs and symptoms How to activate emergency medical system (call 911). Medications prescribed at discharge. Need for follow-up after discharge. Personal risk factors for stroke.

## 2014-07-27 NOTE — Progress Notes (Signed)
Pt having loop monitor implanted during recovery by Dr. Ladona Ridgel.

## 2014-07-27 NOTE — CV Procedure (Signed)
TRANSESOPHAGEAL ECHOCARDIOGRAM (TEE) NOTE  INDICATIONS: stroke  PROCEDURE:   Informed consent was obtained prior to the procedure. The risks, benefits and alternatives for the procedure were discussed and the patient comprehended these risks.  Risks include, but are not limited to, cough, sore throat, vomiting, nausea, somnolence, esophageal and stomach trauma or perforation, bleeding, low blood pressure, aspiration, pneumonia, infection, trauma to the teeth and death.    After a procedural time-out, the patient was given 4 mg versed and 50 mcg fentanyl as well as 25 mg IV benadryl for moderate sedation.  The oropharynx was anesthetized 10 cc of topical 1% viscous lidocaine and 2 cetacaine sprays.  The transesophageal probe was inserted in the esophagus and stomach without difficulty and multiple views were obtained.  The patient was kept under observation until the patient left the procedure room.  The patient left the procedure room in stable condition.   Agitated microbubble saline contrast was administered.  COMPLICATIONS:    There were no immediate complications.  Findings:  1. LEFT VENTRICLE: The left ventricular wall thickness is mildly increased.  The left ventricular cavity is normal in size. Wall motion is normal.  LVEF is 55-60%.  2. RIGHT VENTRICLE:  The right ventricle is normal in structure and function without any thrombus or masses.    3. LEFT ATRIUM:  The left atrium is dilated in size without any thrombus or masses.  There is not spontaneous echo contrast ("smoke") in the left atrium consistent with a low flow state.  4. LEFT ATRIAL APPENDAGE:  The left atrial appendage is free of any thrombus or masses. The appendage has single lobes. Pulse doppler indicates moderate flow in the appendage.  5. ATRIAL SEPTUM:  The atrial septum appears intact and is free of thrombus and/or masses.  There is no evidence for interatrial shunting by color doppler and saline  microbubble.  6. RIGHT ATRIUM:  The right atrium is divided with a second fenestrated septum. SVC flow is directed along the septum and through this fenestration and then toward the tricuspid valve. This is consistent with cor triatriatum.  7. MITRAL VALVE:  The mitral valve is normal in structure and function with Mild regurgitation.  There were no vegetations or stenosis.  8. AORTIC VALVE:  The aortic valve is trileaflet, normal in structure and function with no regurgitation.  There were no vegetations or stenosis  9. TRICUSPID VALVE:  The tricuspid valve is normal in structure and function with Mild regurgitation.  There were no vegetations or stenosis  10.  PULMONIC VALVE:  The pulmonic valve is normal in structure and function with trivial regurgitation.  There were no vegetations or stenosis.   11. AORTIC ARCH, ASCENDING AND DESCENDING AORTA:  There was grade 2 Myrtis Ser et. Al, 1992) atherosclerosis of the distal aortic arch and proximal descending aorta.  12. PULMONARY VEINS: Anomalous pulmonary venous return was not noted. Brisk saline microbubbles were seen immediately in the RA after injection in the left arm.  13. PERICARDIUM: The pericardium appeared normal and non-thickened.  There is no pericardial effusion.  IMPRESSION:   1. No LAA thrombus 2. Negative for PFO 3. 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 LVEF 55-60% 5. Grade 2 aortic atherosclerosis of the distal aortic arch and proximal descending aorta  RECOMMENDATIONS:    1. Suspect more likely cause of stroke to be related to carotid artery  disease. Consider loop recorder, however, given the small chance of occult a-fib. 2. Incidental finding of cor triatriatum dextrum, without other apparent congenital heart disease  Time Spent Directly with the Patient:  60 minutes   Chrystie Nose, MD,  [facility] Attending Cardiologist [facility]  07/27/2014, 2:14 PM

## 2014-07-27 NOTE — Progress Notes (Signed)
Physical Therapy Evaluation and Discharge  Patient Details Name: [name] MRN: [id number] DOB: 1955-06-03 Today's Date: 07/27/2014  Clinical Impression: Patient evaluated by Physical Therapy with no further acute PT needs identified. All education has been completed and the patient has no further questions. No gait disturbance noted. Safely performed dynamic gait challenges without an assistive device and no loss of balance. Reviewed sign/symptoms of stroke with patient and family. States he would like to quit smoking. See below for any follow-up Physial Therapy or equipment needs. PT is signing off. Thank you for this referral.     07/27/14 1100  PT Visit Information  Last PT Received On 07/27/14  Assistance Needed +1  History of Present Illness 59 yo male admitted with L facial droop and slurred speech. NIHSS = 2 MRI = R frontal operculum and anterior portion of the insular cortex, acute punctate infarct in the high R parietal lobe, CT (+) R ICA occluded, R M2 single branch occlusion just beyond bifurcation . 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  Ambulation/Gait  Ambulation/Gait assistance Independent  Ambulation Distance (Feet) 200 Feet  Assistive device None  General Gait Details Ambulates well. Safely performed dynamic gait challenges including high marching, quick turns, variable speeds,and backwards stepping. No loss of balance or notable gait disturbance.  Gait velocity interpretation at or above normal speed for age/gender  Stairs Yes  Stairs assistance Modified independent (Device/Increase time)  Stair Management One rail Right;Alternating pattern;Forwards  Number of Stairs 3  General stair comments No assist required. Performed safely.  Modified Rankin (Stroke Patients Only)  Pre-Morbid Rankin Score 0  Modified Rankin 0  Balance  Overall balance assessment Independent  General Comments  General comments (skin integrity, edema, etc.) Reviewed signs/symptoms of stroke including modifiable risk factors. Used acronym FAST for patient and family education. All verbalize understanding. Pt states he wasnt to quit smoking.  PT - End of Session  Activity Tolerance Patient tolerated treatment well  Patient left with call bell/phone within reach;in chair;with family/visitor present  Nurse Communication Mobility status  PT Assessment  PT Therapy Diagnosis  Other (comment) (Pt admitted for CVA, symptoms resolved prior to PT eval)  PT Recommendation/Assessment Patent does not need any further PT services  No Skilled PT All education completed;Patient at baseline level of functioning;Patient is independent with all acitivity/mobility  PT Recommendation  Follow Up Recommendations No PT follow up  PT equipment  None recommended by PT  Acute Rehab PT Goals  Patient Stated Goal to go home  PT Goal Formulation All assessment and education complete, DC therapy  PT Time Calculation  PT Start Time (ACUTE ONLY) 1127  PT Stop Time (ACUTE ONLY) 1138  PT Time Calculation (min) (ACUTE ONLY) 11 min  PT General Charges  $$ ACUTE PT VISIT 1 Procedure  PT Evaluation  $Initial PT Evaluation Tier I 1 Procedure  Written Expression  Dominant Hand Right   Charlsie Merles, PT [phone]

## 2014-07-27 NOTE — H&P (Signed)
     INTERVAL PROCEDURE H&P  History and Physical Interval Note:  07/27/2014 12:58 PM  [name] has presented today for their planned procedure. The various methods of treatment have been discussed with the patient and family. After consideration of risks, benefits and other options for treatment, the patient has consented to the procedure.  The patients' outpatient history has been reviewed, patient examined, and no change in status from most recent office note within the past 30 days. I have reviewed the patients' chart and labs and will proceed as planned. Questions were answered to the patient's satisfaction.   Chrystie Nose, MD, [facility] Attending Cardiologist [facility]  Chrystie Nose 07/27/2014, 12:58 PM

## 2014-07-27 NOTE — Evaluation (Signed)
Occupational Therapy Evaluation Patient Details Name: [name] MRN: [id number] DOB: 03-22-1955 Today's Date: 07/27/2014    History of Present Illness 59 yo male admitted with L facial droop and slurred speech. NIHSS = 2 MRI = R frontal operculum and anterior portion of the insular cortex, acute punctate infarct in the high R parietal lobe, CT (+) R ICA occluded, R M2 single branch occlusion just beyond bifurcation . pt drinks 6-18 beers per day PMH: HTN arthritis   Clinical Impression   Pt reports new decreased peripheral vision (~30%) on L and R sides. Pt denies sensation deficits. Pt completed grooming activity at sink with supervision. Pt able to subtract 7 from 100, and answer money management questions. Pt reports spouse is available upon d/c to A with ADLs as needed. No further OT needed at this time.    Follow Up Recommendations  No OT follow up    Equipment Recommendations       Recommendations for Other Services       Precautions / Restrictions Precautions Precautions: Fall Restrictions Weight Bearing Restrictions: No      Mobility Bed Mobility Overal bed mobility: Independent             General bed mobility comments: cuing for IV placement  Transfers Overall transfer level: Needs assistance Equipment used: None Transfers: Sit to/from Stand Sit to Stand: Supervision              Balance Overall balance assessment: No apparent balance deficits (not formally assessed)                                          ADL Overall ADL's : Needs assistance/impaired Eating/Feeding: Independent   Grooming: Wash/dry face;Oral care;Supervision/safety;Standing   Upper Body Bathing: Supervision/ safety;Standing   Lower Body Bathing: Cueing for safety;Min guard   Upper Body Dressing : Independent   Lower Body Dressing: Supervision/safety;Sit to/from stand   Toilet Transfer: Supervision/safety;Ambulation;Regular Toilet   Toileting-  Architect and Hygiene: Independent   Tub/ Engineer, structural: Tub transfer;Ambulation   Functional mobility during ADLs: Min guard       Vision Vision Assessment?: Yes Eye Alignment: Within Functional Limits Ocular Range of Motion: Within Functional Limits Alignment/Gaze Preference: Within Defined Limits Tracking/Visual Pursuits: Able to track stimulus in all quads without difficulty Saccades: Within functional limits Visual Fields: Impaired-to be further tested in functional context (decreased horizontal peripheral vision) Additional Comments: Pt reports having new 30% decreased peripheral vision   Perception     Praxis      Pertinent Vitals/Pain Pain Assessment: No/denies pain     Hand Dominance Right   Extremity/Trunk Assessment Upper Extremity Assessment Upper Extremity Assessment: Overall WFL for tasks assessed   Lower Extremity Assessment Lower Extremity Assessment: Overall WFL for tasks assessed       Communication Communication Communication: No difficulties   Cognition Arousal/Alertness: Awake/alert Behavior During Therapy: WFL for tasks assessed/performed Overall Cognitive Status: Within Functional Limits for tasks assessed                     General Comments       Exercises       Shoulder Instructions      Home Living Family/patient expects to be discharged to:: Private residence Living Arrangements: Spouse/significant other;Other (Comment) Available Help at Discharge: Family Type of Home: House Home Access: Stairs to enter [employer] of  Steps: 2 Entrance Stairs-Rails: Right;Left Home Layout: One level     Bathroom Shower/Tub: Tub/shower unit;Curtain Shower/tub characteristics: Engineer, building services: Standard     Home Equipment: None      Lives With: Spouse;Other (Comment)    Prior Functioning/Environment Level of Independence: Independent        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    OT  Diagnosis: Generalized weakness   OT Problem List: Impaired vision/perception   OT Treatment/Interventions:      OT Goals(Current goals can be found in the care plan section) Acute Rehab OT Goals Patient Stated Goal: to go home OT Goal Formulation: With patient Time For Goal Achievement: 08/10/14 Potential to Achieve Goals: Good  OT Frequency:     Barriers to D/C:            Co-evaluation              End of Session Equipment Utilized During Treatment: Gait belt Nurse Communication: Mobility status;Precautions  Activity Tolerance: Patient tolerated treatment well Patient left: in bed;with bed alarm set;with family/visitor present;with call bell/phone within reach   Time: 1025-1050 OT Time Calculation (min): 25 min Charges:  OT General Charges $OT Visit: 1 Procedure OT Evaluation $Initial OT Evaluation Tier I: 1 Procedure G-Codes:    Marden Noble 07/27/2014, 12:57 PM

## 2014-07-27 NOTE — Progress Notes (Signed)
Spoke with MD received verbal order to d/c bedrest order. Pt to participate in PT/OT for therapy per Md order.

## 2014-07-27 NOTE — Evaluation (Signed)
Speech Language Pathology Evaluation Patient Details Name: [name] MRN: [id number] DOB: Dec 27, 1955 Today's Date: 07/27/2014 Time: 7829-5621 SLP Time Calculation (min) (ACUTE ONLY): 15 min  Problem List:  Patient Active Problem List   Diagnosis Date Noted  . Carotid artery occlusion with infarction   . Acute thrombotic stroke 07/25/2014  . CVA (cerebral infarction) 07/25/2014  . Acute ischemic stroke   . Essential hypertension   . Hyperlipidemia   . Tobacco abuse   . Essential hypertension, benign 10/11/2013  . Mixed hyperlipidemia 10/11/2013   Past Medical History:  Past Medical History  Diagnosis Date  . Arthritis   . Hypertension   . Hyperlipidemia    Past Surgical History: History reviewed. No pertinent past surgical history. HPI:  Pt is a 59 yo male admitted with left facial droop and slurred speech; pmhx significant for hyperlipidemia, HTN and alcohol abuse as pt states he drinks 5-18 beers per day and is a current smoker; denies dysphagia and states his speech has improved since yesterday; MR head on 07/25/14 indicated acute non-hemorrhagic infarct of right frontal operculum and anterior portion of insular cortex; Acute punctate infarct in high right parietal lobe near vertex    Assessment / Plan / Recommendation Clinical Impression   Pt with left minimal facial droop, but speech appeared functional and intelligible within complex conversational tasks; auditory comprehension and verbal expression tasks within functional limits as pt followed multi-step directions, repeated complex information and verbally expressed self via conversation; Ox4; Cognitive tasks within functional limits as well as pt appeared to problem solve and indicate safety precautions and limitations with basic situations regarding return to work; premorbid education level needs to be considered with complex tasks    SLP Assessment  Patient does not need any further Speech Language Pathology Services     Follow Up Recommendations  None    Frequency and Duration     n/a   Pertinent Vitals/Pain Pain Assessment: No/denies pain   SLP Goals   n/a  SLP Evaluation Prior Functioning  Cognitive/Linguistic Baseline: Within functional limits Type of Home: House  Lives With: Spouse Available Help at Discharge: Family Education: 8th grade education Vocation: Full time employment   Cognition  Overall Cognitive Status: Within Functional Limits for tasks assessed Arousal/Alertness: Awake/alert Orientation Level: Oriented X4 Memory: Appears intact Awareness: Appears intact Problem Solving: Appears intact Safety/Judgment: Appears intact    Comprehension  Auditory Comprehension Overall Auditory Comprehension: Appears within functional limits for tasks assessed Yes/No Questions: Within Functional Limits Commands: Within Functional Limits Conversation: Complex Visual Recognition/Discrimination Discrimination: Not tested Reading Comprehension Reading Status: Not tested    Expression Expression Primary Mode of Expression: Verbal Verbal Expression Overall Verbal Expression: Appears within functional limits for tasks assessed Initiation: No impairment Level of Generative/Spontaneous Verbalization: Conversation Repetition: No impairment Naming: No impairment Pragmatics: No impairment Non-Verbal Means of Communication: Not applicable Written Expression Dominant Hand: Right Written Expression: Not tested   Oral / Motor Oral Motor/Sensory Function Overall Oral Motor/Sensory Function: Impaired Labial ROM: Reduced left Labial Symmetry: Abnormal symmetry left Labial Strength: Within Functional Limits Labial Sensation: Within Functional Limits Lingual ROM: Within Functional Limits Lingual Symmetry: Within Functional Limits Lingual Strength: Within Functional Limits Lingual Sensation: Within Functional Limits Facial ROM: Within Functional Limits Facial Symmetry: Left droop;Other  (Comment) (minimal) Facial Strength: Within Functional Limits Facial Sensation: Within Functional Limits Velum: Within Functional Limits Mandible: Within Functional Limits Motor Speech Overall Motor Speech: Appears within functional limits for tasks assessed Respiration: Within functional limits Phonation: Normal Resonance: Within  functional limits Articulation: Within functional limitis Intelligibility: Intelligible Motor Planning: Witnin functional limits Motor Speech Errors: Not applicable        Jery Hollern,PAT, M.S., CCC-SLP 07/27/2014, 10:06 AM

## 2014-07-28 LAB — ANTI-DNA ANTIBODY, DOUBLE-STRANDED: DS DNA AB: 3 [IU]/mL (ref 0–9)

## 2014-07-28 LAB — GLUCOSE, CAPILLARY: Glucose-Capillary: 109 mg/dL — ABNORMAL HIGH (ref 65–99)

## 2014-07-28 LAB — RHEUMATOID FACTOR: Rhuematoid fact SerPl-aCnc: 13.8 IU/mL (ref 0.0–13.9)

## 2014-07-29 ENCOUNTER — Emergency Department (HOSPITAL_COMMUNITY): Payer: BLUE CROSS/BLUE SHIELD

## 2014-07-29 ENCOUNTER — Observation Stay (HOSPITAL_COMMUNITY): Payer: BLUE CROSS/BLUE SHIELD

## 2014-07-29 ENCOUNTER — Inpatient Hospital Stay (HOSPITAL_COMMUNITY)
Admission: EM | Admit: 2014-07-29 | Discharge: 2014-08-01 | DRG: 065 | Disposition: A | Discharge status: Rehabilitation Facility | Payer: BLUE CROSS/BLUE SHIELD | Attending: Family Medicine | Admitting: Family Medicine

## 2014-07-29 ENCOUNTER — Encounter (HOSPITAL_COMMUNITY): Payer: Self-pay | Admitting: *Deleted

## 2014-07-29 DIAGNOSIS — E782 Mixed hyperlipidemia: Secondary | ICD-10-CM | POA: Diagnosis present

## 2014-07-29 DIAGNOSIS — I10 Essential (primary) hypertension: Secondary | ICD-10-CM | POA: Diagnosis not present

## 2014-07-29 DIAGNOSIS — I6340 Cerebral infarction due to embolism of unspecified cerebral artery: Secondary | ICD-10-CM | POA: Diagnosis not present

## 2014-07-29 DIAGNOSIS — F17200 Nicotine dependence, unspecified, uncomplicated: Secondary | ICD-10-CM | POA: Diagnosis not present

## 2014-07-29 DIAGNOSIS — Z7982 Long term (current) use of aspirin: Secondary | ICD-10-CM

## 2014-07-29 DIAGNOSIS — H5340 Unspecified visual field defects: Secondary | ICD-10-CM | POA: Diagnosis present

## 2014-07-29 DIAGNOSIS — I639 Cerebral infarction, unspecified: Secondary | ICD-10-CM

## 2014-07-29 DIAGNOSIS — I69392 Facial weakness following cerebral infarction: Secondary | ICD-10-CM

## 2014-07-29 DIAGNOSIS — Z7902 Long term (current) use of antithrombotics/antiplatelets: Secondary | ICD-10-CM

## 2014-07-29 DIAGNOSIS — I69398 Other sequelae of cerebral infarction: Secondary | ICD-10-CM

## 2014-07-29 DIAGNOSIS — G8194 Hemiplegia, unspecified affecting left nondominant side: Secondary | ICD-10-CM | POA: Diagnosis not present

## 2014-07-29 DIAGNOSIS — F17210 Nicotine dependence, cigarettes, uncomplicated: Secondary | ICD-10-CM | POA: Diagnosis present

## 2014-07-29 DIAGNOSIS — R4781 Slurred speech: Secondary | ICD-10-CM | POA: Diagnosis present

## 2014-07-29 DIAGNOSIS — I6350 Cerebral infarction due to unspecified occlusion or stenosis of unspecified cerebral artery: Secondary | ICD-10-CM | POA: Diagnosis not present

## 2014-07-29 DIAGNOSIS — Z79899 Other long term (current) drug therapy: Secondary | ICD-10-CM

## 2014-07-29 DIAGNOSIS — I6521 Occlusion and stenosis of right carotid artery: Secondary | ICD-10-CM | POA: Diagnosis present

## 2014-07-29 LAB — BETA-2-GLYCOPROTEIN I ABS, IGG/M/A
Beta-2-Glycoprotein I IgA: <9 GPI IgA units (ref 0–25)
Beta-2-Glycoprotein I IgM: <9 GPI IgM units (ref 0–32)

## 2014-07-29 LAB — I-STAT CG4 LACTIC ACID, ED: Lactic Acid, Venous: 2.69 mmol/L (ref 0.5–2.0)

## 2014-07-29 LAB — I-STAT CHEM 8, ED
BUN: 16 mg/dL (ref 6–20)
CALCIUM ION: 1.15 mmol/L (ref 1.12–1.23)
Chloride: 99 mmol/L — ABNORMAL LOW (ref 101–111)
Creatinine, Ser: 1.2 mg/dL (ref 0.61–1.24)
GLUCOSE: 123 mg/dL — AB (ref 65–99)
HEMATOCRIT: 50 % (ref 39.0–52.0)
HEMOGLOBIN: 17 g/dL (ref 13.0–17.0)
Potassium: 3.8 mmol/L (ref 3.5–5.1)
SODIUM: 135 mmol/L (ref 135–145)
TCO2: 19 mmol/L (ref 0–100)

## 2014-07-29 LAB — COMPREHENSIVE METABOLIC PANEL
ALT: 36 U/L (ref 17–63)
AST: 34 U/L (ref 15–41)
Albumin: 4.1 g/dL (ref 3.5–5.0)
Alkaline Phosphatase: 65 U/L (ref 38–126)
Anion gap: 14 (ref 5–15)
BILIRUBIN TOTAL: 0.7 mg/dL (ref 0.3–1.2)
BUN: 14 mg/dL (ref 6–20)
CHLORIDE: 97 mmol/L — AB (ref 101–111)
CO2: 20 mmol/L — ABNORMAL LOW (ref 22–32)
CREATININE: 1.3 mg/dL — AB (ref 0.61–1.24)
Calcium: 9.7 mg/dL (ref 8.9–10.3)
GFR calc Af Amer: >60 mL/min (ref >60)
GFR calc non Af Amer: 59 mL/min — ABNORMAL LOW (ref >60)
Glucose, Bld: 121 mg/dL — ABNORMAL HIGH (ref 65–99)
Potassium: 3.8 mmol/L (ref 3.5–5.1)
Sodium: 131 mmol/L — ABNORMAL LOW (ref 135–145)
Total Protein: 8.5 g/dL — ABNORMAL HIGH (ref 6.5–8.1)

## 2014-07-29 LAB — APTT: aPTT: 36 seconds (ref 24–37)

## 2014-07-29 LAB — CBC
HEMATOCRIT: 44 % (ref 39.0–52.0)
Hemoglobin: 15.8 g/dL (ref 13.0–17.0)
MCH: 32.9 pg (ref 26.0–34.0)
MCHC: 35.9 g/dL (ref 30.0–36.0)
MCV: 91.7 fL (ref 78.0–100.0)
PLATELETS: 282 10*3/uL (ref 150–400)
RBC: 4.8 MIL/uL (ref 4.22–5.81)
RDW: 12.3 % (ref 11.5–15.5)
WBC: 12 10*3/uL — ABNORMAL HIGH (ref 4.0–10.5)

## 2014-07-29 LAB — PROTIME-INR
INR: 1.01 (ref 0.00–1.49)
PROTHROMBIN TIME: 13.5 s (ref 11.6–15.2)

## 2014-07-29 LAB — DIFFERENTIAL
BASOS PCT: 0 % (ref 0–1)
Basophils Absolute: 0.1 10*3/uL (ref 0.0–0.1)
Eosinophils Absolute: 0.2 10*3/uL (ref 0.0–0.7)
Eosinophils Relative: 1 % (ref 0–5)
LYMPHS PCT: 28 % (ref 12–46)
Lymphs Abs: 3.3 10*3/uL (ref 0.7–4.0)
MONO ABS: 1.1 10*3/uL — AB (ref 0.1–1.0)
Monocytes Relative: 9 % (ref 3–12)
Neutro Abs: 7.4 10*3/uL (ref 1.7–7.7)
Neutrophils Relative %: 62 % (ref 43–77)

## 2014-07-29 LAB — CBG MONITORING, ED
Glucose-Capillary: 107 mg/dL — ABNORMAL HIGH (ref 65–99)
Glucose-Capillary: 126 mg/dL — ABNORMAL HIGH (ref 65–99)

## 2014-07-29 LAB — I-STAT TROPONIN, ED: TROPONIN I, POC: 0 ng/mL (ref 0.00–0.08)

## 2014-07-29 LAB — CARDIOLIPIN ANTIBODIES, IGG, IGM, IGA
ANTICARDIOLIPIN IGM: 95 [MPL'U]/mL — AB (ref 0–12)
Anticardiolipin IgA: <9 APL U/mL (ref 0–11)
Anticardiolipin IgG: <9 GPL U/mL (ref 0–14)

## 2014-07-29 LAB — ANTINUCLEAR ANTIBODIES, IFA: ANA Ab, IFA: NEGATIVE

## 2014-07-29 LAB — HOMOCYSTEINE: HOMOCYSTEINE-NORM: 10.2 umol/L (ref 0.0–15.0)

## 2014-07-29 NOTE — ED Notes (Signed)
Unable to determine LSN after speaking with pts wife, pt and Dr Cyril Mourning.

## 2014-07-29 NOTE — ED Provider Notes (Signed)
CSN: [id number]     Arrival date & time 07/29/14  1937 History   First MD Initiated Contact with Patient 07/29/14 1952     Chief Complaint  Patient presents with  . Code Stroke    @ (Consider location/radiation/quality/duration/timing/severity/associated sxs/prior Treatment) HPI Comments: 4:30 became quieter than normal, drooling around 6, spouse became concerned and drove to ED.   Patient is a 59 y.o. male presenting with Acute Neurological Problem. The history is provided by the patient and the spouse.  Cerebrovascular Accident This is a new problem. The current episode started today. The problem occurs constantly. The problem has been unchanged. Associated symptoms include fatigue and weakness.    Past Medical History  Diagnosis Date  . Arthritis   . Hypertension   . Hyperlipidemia    Past Surgical History  Procedure Laterality Date  . Loop recorder implant  07/27/14    LOOP REVEAL LINQ [id number] - [id number]   Family History  Problem Relation Age of Onset  . Cancer Father   . Heart attack Brother    History  Substance Use Topics  . Smoking status: Current Every Day Smoker -- 0.50 packs/day for 40 years    Types: Cigarettes  . Smokeless tobacco: Not on file  . Alcohol Use: 10.8 oz/week    18 Cans of beer per week    Review of Systems  Constitutional: Positive for fatigue.  Neurological: Positive for weakness.  All other systems reviewed and are negative.     Allergies  Review of patient's allergies indicates no known allergies.  Home Medications   Prior to Admission medications   Medication Sig Start Date End Date Taking? Authorizing Provider  aspirin EC 81 MG EC tablet Take 1 tablet (81 mg total) by mouth daily. 07/27/14  Yes Erasmo Downer, MD  atorvastatin (LIPITOR) 40 MG tablet Take 1 tablet (40 mg total) by mouth daily. 05/10/14  Yes Maurice March, MD  clopidogrel (PLAVIX) 75 MG tablet Take 1 tablet (75 mg total) by mouth daily. 07/27/14   Yes Erasmo Downer, MD  ibuprofen (ADVIL,MOTRIN) 200 MG tablet Take 400 mg by mouth every 6 (six) hours as needed (pain).    Yes Historical Provider, MD  lisinopril-hydrochlorothiazide (PRINZIDE,ZESTORETIC) 20-12.5 MG per tablet Take 1 tablet by mouth daily. 05/10/14  Yes Maurice March, MD   BP 120/59 mmHg  Pulse 59  Temp(Src) 98.8 F (37.1 C) (Oral)  Resp 14  Ht  (1.676 m)  Wt 169 lb (76.658 kg)  BMI 27.29 kg/m2  SpO2 97% Physical Exam  Constitutional: He is oriented to person, place, and time. He appears well-developed and well-nourished. No distress.  HENT:  Head: Normocephalic and atraumatic.  Eyes: Conjunctivae are normal.  Neck: Neck supple. No tracheal deviation present.  Cardiovascular: Normal rate and regular rhythm.   Pulmonary/Chest: Effort normal. No respiratory distress.  Abdominal: Soft. He exhibits no distension.  Neurological: He is alert and oriented to person, place, and time. A cranial nerve deficit is present. GCS eye subscore is 4. GCS verbal subscore is 5. GCS motor subscore is 6.  Left facial droop, new 1/5 left arm weakness 4/5 strength of LLE. Visual fields grossly in tact  Skin: Skin is warm and dry.  Psychiatric: He has a normal mood and affect. His speech is slurred.    ED Course  Procedures (including critical care time) Labs Review Labs Reviewed  CBC - Abnormal; Notable for the following:    WBC 12.0 (*)  All other components within normal limits  DIFFERENTIAL - Abnormal; Notable for the following:    Monocytes Absolute 1.1 (*)    All other components within normal limits  COMPREHENSIVE METABOLIC PANEL - Abnormal; Notable for the following:    Sodium 131 (*)    Chloride 97 (*)    CO2 20 (*)    Glucose, Bld 121 (*)    Creatinine, Ser 1.30 (*)    Total Protein 8.5 (*)    GFR calc non Af Amer 59 (*)    All other components within normal limits  I-STAT CHEM 8, ED - Abnormal; Notable for the following:    Chloride 99 (*)     Glucose, Bld 123 (*)    All other components within normal limits  CBG MONITORING, ED - Abnormal; Notable for the following:    Glucose-Capillary 126 (*)    All other components within normal limits  I-STAT CG4 LACTIC ACID, ED - Abnormal; Notable for the following:    Lactic Acid, Venous 2.69 (*)    All other components within normal limits  CBG MONITORING, ED - Abnormal; Notable for the following:    Glucose-Capillary 107 (*)    All other components within normal limits  PROTIME-INR  APTT  LACTIC ACID, PLASMA  I-STAT TROPOININ, ED  I-STAT CG4 LACTIC ACID, ED    Imaging Review Ct Head (brain) Wo Contrast  07/29/2014   CLINICAL DATA:  Code stroke. Left-sided weakness and left facial droop. Confusion and diaphoresis. Recent CVA. Initial encounter.  EXAM: CT HEAD WITHOUT CONTRAST  TECHNIQUE: Contiguous axial images were obtained from the base of the skull through the vertex without intravenous contrast.  COMPARISON:  CT/CTA of the head performed 07/26/2014, and MRI of the brain performed 07/25/2014  FINDINGS: There is an evolving infarct at the anterior right frontal operculum, as previously noted, and at the right insular cortex. The infarct at the insular cortex has increased in size since the prior MRI, involving portions of the right external capsule, with vague superior extension into the subcortical white matter of the right parietal lobe. The known punctate infarct at the high right parietal lobe is not well seen on CT. No definite new infarct is characterized. There is no evidence of intra or extra-axial hemorrhage.  Small chronic lacunar infarcts are noted at the left basal ganglia. A right central 3.3 cm arachnoid cyst is again noted at the posterior fossa.  The brainstem and fourth ventricle are within normal limits. The third and lateral ventricles are unremarkable in appearance. No midline shift is seen.  There is no evidence of fracture; visualized osseous structures are unremarkable  in appearance. The orbits are within normal limits. The paranasal sinuses and mastoid air cells are well-aerated. No significant soft tissue abnormalities are seen.  IMPRESSION: 1. No definite new infarct seen at this time. 2. Evolving infarct at the anterior right frontal operculum, as previously noted, and at the right insular cortex. The infarct in the insular cortex has increased in size since the prior MRI, now involving portions of the right external capsule, with vague superior extension into the subcortical white matter of the right parietal lobe. Known punctate infarct at the high right parietal lobe is not well seen on CT. No evidence of hemorrhagic transformation at this time. 3. Small chronic lacunar infarcts at the left basal ganglia. 4. Right central 3.3 cm arachnoid cyst again seen at the posterior fossa.  These results were called by telephone at the time of interpretation  on 07/29/2014 at 8:03 pm to Dr. Leroy Kennedy, who verbally acknowledged these results.   Electronically Signed   By: Roanna Raider M.D.   On: 07/29/2014 20:05   Mr Brain Wo Contrast  07/29/2014   ADDENDUM REPORT: 07/29/2014 23:32  ADDENDUM: Acute findings discussed with and reconfirmed by Dr.OSVALDO CAMILO on 07/29/2014 at 11:30 pm.   Electronically Signed   By: Awilda Metro M.D.   On: 07/29/2014 23:32   07/29/2014   CLINICAL DATA:  LEFT-sided weakness beginning today, slurred speech and facial droop. Similar symptoms to prior stroke, however extremity weakness is new. History of hypertension and hyperlipidemia.  EXAM: MRI HEAD WITHOUT CONTRAST  TECHNIQUE: Multiplanar, multiecho pulse sequences of the brain and surrounding structures were obtained without intravenous contrast.  COMPARISON:  CT head July 29, 2014 and MRI of the brain July 25, 2014  FINDINGS: Reduced diffusion within RIGHT posterior insula, new from prior examination, with mild reduced diffusion within the RIGHT lenticulostriate nucleus. Patchy reduced diffusion in  the posterior RIGHT frontal lobe/operculum, new from prior examination. These areas show low ADC values. Reduced diffusion within RIGHT frontal operculum, with normalized ADC values consistent with acute to subacute infarct. No susceptibility artifact to suggest hemorrhage.  Ventricles and sulci are overall normal for patient's age. Scattered subcentimeter supratentorial white matter FLAIR T2 hyperintensities exclusive of the aforementioned on abnormality consistent with chronic small vessel ischemic disease. No midline shift. No significant mass effect.  Posterior fossa arachnoid cyst again noted. No abnormal extra-axial fluid collections. Loss of RIGHT internal carotid artery flow related enhancement again noted, however, poor RIGHT middle cerebral artery flow related enhancement, which appears new from prior imaging.  Ocular globes and orbital contents are unremarkable. Paranasal sinuses are well aerated. Trace mastoid effusions. Mild temporomandibular osteoarthrosis. No abnormal sellar expansion. No cerebellar tonsillar ectopia. No suspicious calvarial bone marrow signal.  IMPRESSION: Acute moderate RIGHT middle cerebral artery territory infarct (further involvement of the operculum, new involvement of the insula and RIGHT basal ganglia), propagated from prior imaging. No hemorrhagic conversion.  Suspected occluded RIGHT internal carotid artery, with slow flow versus occluded RIGHT middle cerebral artery.  Electronically Signed: By: Awilda Metro M.D. On: 07/29/2014 23:28   I independently viewed and interpreted the above radiology studies and agree with radiologist report.    EKG Interpretation   Date/Time:  Sunday July 29 2014 19:57:14 EDT Ventricular Rate:  73 PR Interval:  169 QRS Duration: 95 QT Interval:  404 QTC Calculation: 445 R Axis:   71 Text Interpretation:  Sinus rhythm Normal ECG When compared with ECG of  07/25/2014, ST elevation in Anterior leads is no longer Present Confirmed by   [facility]  MD, DAVID ([id number]) on 07/29/2014 8:09:00 PM      MDM   Final diagnoses:  Acute thrombotic stroke   59 year old male presents with recurrent stroke symptoms with new-onset left hemiparesis in upper and lower extremities. He has persistent left facial droop, has had intermittent symptoms over the course of the day per his wife is available at bedside to provide most of the history.  Neurology is available at bedside to evaluate the patient and agree with new symptoms and recommended admission to hospitalist service for further workup and management of recurrent stroke.   Lyndal Pulley, MD 07/30/14 [id number]  Dione Booze, MD 07/30/14 [phone]

## 2014-07-29 NOTE — ED Notes (Signed)
Dr Preston Fleeting given a copy of lactic acid results 2.69

## 2014-07-29 NOTE — H&P (Signed)
Family Medicine Teaching [facility] Admission History and Physical Service Pager: [phone]  Patient name: [name] Medical record number: [id number] Date of birth: October 11, 1955 Age: 59 y.o. Gender: male  Primary Care Provider: Dow Adolph, MD Consultants: Neurology Code Status: Full Code  Chief Complaint: Left sided hemiparesis  Assessment and Plan: [name] is a 59 y.o. male presenting with concern for acute stroke. PMH is significant for CVA, HTN  Left-sided hemiparesis: this is a new finding from recent stroke. Possible new stroke vs extension of previous stroke. Neurology consulted in the ED. Patient had a loop recorder implanted during last hospitalization. Found to have 100% occlusion of right internal carotid on CTA previous admission. TEE on previous admission without evidence of atrial thrombus or vegetations. Because of multiple areas of infarction, concern for embolism but unknown location. Although would not expect it from a 100% occluded vessel, possible that may be the source. Hemoglobin A1c of 6. LDL of 97. - place in observation, attending [name] - frequent neuro checks - Neurology recommendations: MRI brain - consult cardiology in AM to interrogate loop recorder - telemetry since possible he has paroxysmal atrial fibrillation - RN bedside swallow - continue statin - continue Plavix/ASA - PT/OT  #Hypertension: normotensive in the ED - allow for permissive hypertension - hold lisinopril-hctz - follow blood pressures  #Tobacco abuse: patient quit smoking since previous admission - nicotine patch  daily  #Elevated lactic acid: initially elevated to 2.69 on admission. Patient currently stable. Normotensive. Possibly related to stroke - repeat lactic acid to see downtrend  FEN/GI: Diet pending RN swallow screen, currently NPO. NS /hr Prophylaxis: heparin subq  Disposition: Place in observation  History of Present Illness: [name] is a 59 y.o. male presenting with left sided weakness. Patient reports developing left sided weakness today to the point of not being able to walk. He reports associated worsening slurred speech and left sided facial droop and numbness. He states they are recurrence of his symptoms from his stroke earlier, however, the arm and leg weakness is new. He did not report any associated symptoms off palpitations. He has a loop recorder in place.  While in the ED, code stroke was called. CT significant for no definite new infarct however with evolving right lesion that extends to the right external capsule. MRI ordered and pending.  Review Of Systems: Per HPI with the following additions: None Otherwise 12 point review of systems was performed and was unremarkable.  Patient Active Problem List   Diagnosis Date Noted  . Carotid artery occlusion with infarction   . ETOH abuse   . Acute thrombotic stroke 07/25/2014  . CVA (cerebral infarction) 07/25/2014  . Acute ischemic stroke   . Essential hypertension   . Hyperlipidemia   . Tobacco abuse   . Essential hypertension, benign 10/11/2013  . Mixed hyperlipidemia 10/11/2013   Past Medical History: Past Medical History  Diagnosis Date  . Arthritis   . Hypertension   . Hyperlipidemia    Past Surgical History: History reviewed. No pertinent past surgical history. Social History: History  Substance Use Topics  . Smoking status: Current Every Day Smoker -- 0.50 packs/day for 40 years    Types: Cigarettes  . Smokeless tobacco: Not on file  . Alcohol Use: 10.8 oz/week    18 Cans of beer per week   Additional social history: None  Please also refer to relevant sections of EMR.  Family History: Family History  Problem Relation Age of Onset  . Cancer  Father   . Heart attack Brother    Allergies and Medications: No Known Allergies No current facility-administered medications on file prior to encounter.   Current Outpatient  Prescriptions on File Prior to Encounter  Medication Sig Dispense Refill  . aspirin EC 81 MG EC tablet Take 1 tablet (81 mg total) by mouth daily. 30 tablet 0  . atorvastatin (LIPITOR) 40 MG tablet Take 1 tablet (40 mg total) by mouth daily. 30 tablet 5  . clopidogrel (PLAVIX) 75 MG tablet Take 1 tablet (75 mg total) by mouth daily. 30 tablet 0  . ibuprofen (ADVIL,MOTRIN) 200 MG tablet Take 400 mg by mouth every 6 (six) hours as needed for moderate pain.    Marland Kitchen lisinopril-hydrochlorothiazide (PRINZIDE,ZESTORETIC) 20-12.5 MG per tablet Take 1 tablet by mouth daily. 30 tablet 5    Objective: BP 119/73 mmHg  Pulse 66  Temp(Src) 99.2 F (37.3 C) (Oral)  Resp 18  Ht  (1.676 m)  Wt 169 lb (76.658 kg)  BMI 27.29 kg/m2  SpO2 96% Exam: General: Fair appearing, laying in bed, no distress Eyes: PERRL, EOMI, anicteric ENTM: Oropharynx clear and moist Neck: No adenopathy Cardiovascular: Regular rate and rhythm, no murmur Respiratory: Clear to auscultation bilaterally, no wheezing, non-labored breathing Abdomen: Soft, non-tender, non-distended, bruise located on right LQ MSK: No calf tenderness, no muscle wasting Skin: Non-cyanotic Neuro: Alert, oriented x3 Cranial Nerves II - XII - II - Visual field intact OU. III, IV, VI - Extraocular movements intact. V - Facial sensation intact on right. Left-sided deficit in V1, V2 and V3 distribution VII - Facial movement intact on right. Left-sided deficit with lip droop VIII - Hearing intact on right. Left sided hearing deficit. X - Patient has dysarthria XI - Patient unable to shrug left shoulder XII - Tongue protrusion intact.  Motor Strength - The patient's strength was 5/5 in right upper and lower extremities. 3/5 strength in LUE and 4/5 strength in LLE. Pronator drift was absent in right, although could not assess left. Bulk was normal and fasciculations were absent.  Motor Tone - Muscle tone was assessed at the neck and appendages and  was normal.  Reflexes - The patient's reflexes were 1+ in all extremities. Positive babinski of left lower extremity.  Sensory - Light touch, temperature/pinprick were assessed and with left sided deficit of UE and LE.   Coordination - The patient had normal movements in the hands with no dysmetria when assessing right side. Could not assess left. Tremor was absent.  Gait and Station - deferred due to safety concerns.  Psych: Normal affect  Labs and Imaging: CBC BMET   Recent Labs Lab 07/29/14 1945 07/29/14 1949  WBC 12.0*  --   HGB 15.8 17.0  HCT 44.0 50.0  PLT 282  --     Recent Labs Lab 07/29/14 1945 07/29/14 1949  NA 131* 135  K 3.8 3.8  CL 97* 99*  CO2 20*  --   BUN 14 16  CREATININE 1.30* 1.20  GLUCOSE 121* 123*  CALCIUM 9.7  --      Ct Head (brain) Wo Contrast  07/29/2014   CLINICAL DATA:  Code stroke. Left-sided weakness and left facial droop. Confusion and diaphoresis. Recent CVA. Initial encounter.  EXAM: CT HEAD WITHOUT CONTRAST  TECHNIQUE: Contiguous axial images were obtained from the base of the skull through the vertex without intravenous contrast.  COMPARISON:  CT/CTA of the head performed 07/26/2014, and MRI of the brain performed 07/25/2014  FINDINGS: There  is an evolving infarct at the anterior right frontal operculum, as previously noted, and at the right insular cortex. The infarct at the insular cortex has increased in size since the prior MRI, involving portions of the right external capsule, with vague superior extension into the subcortical white matter of the right parietal lobe. The known punctate infarct at the high right parietal lobe is not well seen on CT. No definite new infarct is characterized. There is no evidence of intra or extra-axial hemorrhage.  Small chronic lacunar infarcts are noted at the left basal ganglia. A right central 3.3 cm arachnoid cyst is again noted at the posterior fossa.  The brainstem and fourth ventricle are within  normal limits. The third and lateral ventricles are unremarkable in appearance. No midline shift is seen.  There is no evidence of fracture; visualized osseous structures are unremarkable in appearance. The orbits are within normal limits. The paranasal sinuses and mastoid air cells are well-aerated. No significant soft tissue abnormalities are seen.  IMPRESSION: 1. No definite new infarct seen at this time. 2. Evolving infarct at the anterior right frontal operculum, as previously noted, and at the right insular cortex. The infarct in the insular cortex has increased in size since the prior MRI, now involving portions of the right external capsule, with vague superior extension into the subcortical white matter of the right parietal lobe. Known punctate infarct at the high right parietal lobe is not well seen on CT. No evidence of hemorrhagic transformation at this time. 3. Small chronic lacunar infarcts at the left basal ganglia. 4. Right central 3.3 cm arachnoid cyst again seen at the posterior fossa.  These results were called by telephone at the time of interpretation on 07/29/2014 at 8:03 pm to Dr. Leroy Kennedy, who verbally acknowledged these results.   Electronically Signed   By: Roanna Raider M.D.   On: 07/29/2014 20:05    Narda Bonds, MD 07/29/2014, 9:04 PM PGY-2, [facility] FPTS Intern pager: [phone], text pages welcome

## 2014-07-29 NOTE — Consult Note (Signed)
Referring Physician: ED    Chief Complaint: code stroke, left face weakness, left hemiparesis, and dysarthria.  HPI:                                                                                                                                         [name] is an 59 y.o. male with a past medical history significant for HTN, hyperlipidemia, right MCA territory infarct on 6/8 with single right M2 branch occlusion identified just beyond the bifurcation and occluded right ICA at its origin, s/p loop recorder placement, returns to the ED for evaluation of the above stated symptoms. Wife is at the bedside and indicated that patient was doing reasonably well after discharge from the hospital 2 days ago, but since this morning he has been having some trouble with mobility, quieter that usual, and then throughout the course of the day she noticed that he was drooling, his left face was droopy, he was slurring his words, and approximately by 6:42 was not able to use his left arm. Wife said that the left face weakness and left arm weakness are new. Denies HA, vertigo, double vision, or visual impairment. NIHSS 12 CT brain was personally reviewed and showed no acute abnormality. In comparison to last MRI brain, there is an evolving infarct at the anterior right frontal operculum, the right insular cortex, with vaguesuperior extension into the subcortical white matter of the right parietal lobe. Patient is on aspirin and plavix since recent discharge from the hospital. Recent comprehensive stroke work up as below: Mr Brain Wo Contrast 07/25/2014 1. Acute nonhemorrhagic infarct of the right frontal operculum and anterior portion of the insular cortex. 2. Acute punctate infarct in the high right parietal lobe near the vertex. 3. Slow or occluded flow in the right internal carotid artery to the level of the posterior communicating artery. 4. Age advanced periventricular T2 changes likely reflect the sequela of  chronic microvascular ischemia.  CTA Head & Neck  1. Right ICA is occluded at its origin with no reconstituted flow in the neck. Moderate to poor reconstitution of the distal right ICA siphon. Right MCA origin and M1 segment are patent. Single right M2 branch occlusion identified just beyond the bifurcation. 2. No other major circle of Willis branch occlusion. Stenosis versus non dominance of the right ACA A1 segment. 3. Left ICA origin and bulb atherosclerosis without hemodynamically significant stenosis. Up to moderate left vertebral artery V1 segment stenosis.  LE Venous dopplers No DVT  2D Echocardiogram  - Left ventricle: The cavity size was normal. Systolic function was vigorous. The estimated ejection fraction was in the range of 65%to 70%. Wall motion was normal; there were no regional wallmotion abnormalities. Doppler parameters are consistent with abnormal left ventricular relaxation (grade 1 diastolicdysfunction). There was no evidence of elevated ventricularfilling pressure by Doppler parameters. - Aortic root: The aortic root was normal in size. -  Ascending aorta: The ascending aorta was normal in size. - Mitral valve: There was mild regurgitation. - Left atrium: The atrium was mildly dilated. - Right ventricle: The cavity size was normal. Wall thickness wasnormal. Systolic function was normal. - Right atrium: The atrium was normal in size. - Tricuspid valve: There was mild regurgitation. - Pericardium, extracardiac: The pericardium was normal inappearance.  TEE  1. No LAA thrombus 2. Negative for PFO 3. 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 LVEF 55-60% 5. Grade 2 aortic atherosclerosis of the distal aortic arch and proximal descending aorta   Date last known well: 6/12/156 Time last known well: unclear tPA Given: no, out of the  window NIHSS: 12   Past Medical History  Diagnosis Date  . Arthritis   . Hypertension   . Hyperlipidemia     History reviewed. No pertinent past surgical history.  Family History  Problem Relation Age of Onset  . Cancer Father   . Heart attack Brother    Social History:  reports that he has been smoking Cigarettes.  He has a 20 pack-year smoking history. He does not have any smokeless tobacco history on file. He reports that he drinks about 10.8 oz of alcohol per week. He reports that he does not use illicit drugs.  Family history: no epilepsy, MS, or brain tumors.  Allergies: No Known Allergies  Medications:                                                                                                                           I have reviewed the patient's current medications.  ROS:                                                                                                                                       History obtained from wife, chart review and the patient  General ROS: negative for - chills, fatigue, fever, night sweats, weight gain or weight loss Psychological ROS: negative for - behavioral disorder, hallucinations, memory difficulties, mood swings or suicidal ideation Ophthalmic ROS: negative for - blurry vision, double vision, eye pain or loss of vision ENT ROS: negative for - epistaxis, nasal discharge, oral lesions, sore throat, tinnitus or vertigo Allergy and Immunology ROS: negative for - hives or itchy/watery  eyes Hematological and Lymphatic ROS: negative for - bleeding problems, bruising or swollen lymph nodes Endocrine ROS: negative for - galactorrhea, hair pattern changes, polydipsia/polyuria or temperature intolerance Respiratory ROS: negative for - cough, hemoptysis, shortness of breath or wheezing Cardiovascular ROS: negative for - chest pain, dyspnea on exertion, edema or irregular heartbeat Gastrointestinal ROS: negative for - abdominal pain,  diarrhea, hematemesis, nausea/vomiting or stool incontinence Genito-Urinary ROS: negative for - dysuria, hematuria, incontinence or urinary frequency/urgency Musculoskeletal ROS: negative for - joint swelling Neurological ROS: as noted in HPI Dermatological ROS: negative for rash and skin lesion changes  Physical exam: pleasant male in no apparent distress. BP 160/97 P 82 R 17 Temperature 99.2 F (37.3 C), temperature source Oral, height 5\' 6"  (1.676 m), weight 76.658 kg (169 lb). Head: normocephalic. Neck: supple, no bruits, no JVD. Cardiac: no murmurs. Lungs: clear. Abdomen: soft, no tender, no mass. Extremities: no edema. Skin: no rash  Neurologic Examination:                                                                                                      General: Mental Status: Alert, oriented, thought content appropriate. Mild dysarthria without evidence of aphasia.  Able to follow 3 step commands without difficulty. Cranial Nerves: II: Discs flat bilaterally; Visual fields grossly normal, pupils equal, round, reactive to light and accommodation III,IV, VI: ptosis not present, extra-ocular motions intact bilaterally V,VII: smile asymmetric due to left face weakness, facial light touch sensation diminished in the left VIII: hearing normal bilaterally IX,X: uvula rises symmetrically XI: bilateral shoulder shrug XII: midline tongue extension without atrophy or fasciculations  Motor: Significant for left UE weakness. Tone and bulk:normal tone throughout; no atrophy noted Sensory: Pinprick and light touch intact diminished in the left side. Deep Tendon Reflexes:  Right: Upper Extremity   Left: Upper extremity   biceps (C-5 to C-6) 2/4   biceps (C-5 to C-6) 2/4 tricep (C7) 2/4    triceps (C7) 2/4 Brachioradialis (C6) 2/4  Brachioradialis (C6) 2/4  Lower Extremity Lower Extremity  quadriceps (L-2 to L-4) 2/4   quadriceps (L-2 to L-4) 2/4 Achilles (S1) 2/4   Achilles (S1)  2/4  Plantars: Right: downgoing   Left: downgoing Cerebellar: normal finger-to-nose and  normal heel-to-shin test in the right. Can not perform FTN in the left due to weakness but left HKS is intact.  Gait:  Unable to test due to multiple leads.   Results for orders placed or performed during the hospital encounter of 07/29/14 (from the past 48 hour(s))  CBG monitoring, ED     Status: Abnormal   Collection Time: 07/29/14  7:42 PM  Result Value Ref Range   Glucose-Capillary 126 (H) 65 - 99 mg/dL   Comment 1 Notify RN   CBC     Status: Abnormal   Collection Time: 07/29/14  7:45 PM  Result Value Ref Range   WBC 12.0 (H) 4.0 - 10.5 K/uL   RBC 4.80 4.22 - 5.81 MIL/uL   Hemoglobin 15.8 13.0 - 17.0 g/dL   HCT 15.1 76.1 - 60.7 %  MCV 91.7 78.0 - 100.0 fL   MCH 32.9 26.0 - 34.0 pg   MCHC 35.9 30.0 - 36.0 g/dL   RDW 96.0 45.4 - 09.8 %   Platelets 282 150 - 400 K/uL  Differential     Status: Abnormal   Collection Time: 07/29/14  7:45 PM  Result Value Ref Range   Neutrophils Relative % 62 43 - 77 %   Neutro Abs 7.4 1.7 - 7.7 K/uL   Lymphocytes Relative 28 12 - 46 %   Lymphs Abs 3.3 0.7 - 4.0 K/uL   Monocytes Relative 9 3 - 12 %   Monocytes Absolute 1.1 (H) 0.1 - 1.0 K/uL   Eosinophils Relative 1 0 - 5 %   Eosinophils Absolute 0.2 0.0 - 0.7 K/uL   Basophils Relative 0 0 - 1 %   Basophils Absolute 0.1 0.0 - 0.1 K/uL  I-stat troponin, ED (not at [facility], [facility])     Status: None   Collection Time: 07/29/14  7:47 PM  Result Value Ref Range   Troponin i, poc 0.00 0.00 - 0.08 ng/mL   Comment 3            Comment: Due to the release kinetics of cTnI, a negative result within the first hours of the onset of symptoms does not rule out myocardial infarction with certainty. If myocardial infarction is still suspected, repeat the test at appropriate intervals.   I-Stat Chem 8, ED  (not at [facility], [facility])     Status: Abnormal   Collection Time: 07/29/14  7:49 PM  Result Value Ref Range   Sodium  135 135 - 145 mmol/L   Potassium 3.8 3.5 - 5.1 mmol/L   Chloride 99 (L) 101 - 111 mmol/L   BUN 16 6 - 20 mg/dL   Creatinine, Ser 1.19 0.61 - 1.24 mg/dL   Glucose, Bld 147 (H) 65 - 99 mg/dL   Calcium, Ion 8.29 5.62 - 1.23 mmol/L   TCO2 19 0 - 100 mmol/L   Hemoglobin 17.0 13.0 - 17.0 g/dL   HCT 13.0 86.5 - 78.4 %  I-Stat CG4 Lactic Acid, ED     Status: Abnormal   Collection Time: 07/29/14  7:51 PM  Result Value Ref Range   Lactic Acid, Venous 2.69 (HH) 0.5 - 2.0 mmol/L   Comment NOTIFIED PHYSICIAN   CBG monitoring, ED     Status: Abnormal   Collection Time: 07/29/14  7:54 PM  Result Value Ref Range   Glucose-Capillary 107 (H) 65 - 99 mg/dL   Ct Head (brain) Wo Contrast  07/29/2014   CLINICAL DATA:  Code stroke. Left-sided weakness and left facial droop. Confusion and diaphoresis. Recent CVA. Initial encounter.  EXAM: CT HEAD WITHOUT CONTRAST  TECHNIQUE: Contiguous axial images were obtained from the base of the skull through the vertex without intravenous contrast.  COMPARISON:  CT/CTA of the head performed 07/26/2014, and MRI of the brain performed 07/25/2014  FINDINGS: There is an evolving infarct at the anterior right frontal operculum, as previously noted, and at the right insular cortex. The infarct at the insular cortex has increased in size since the prior MRI, involving portions of the right external capsule, with vague superior extension into the subcortical white matter of the right parietal lobe. The known punctate infarct at the high right parietal lobe is not well seen on CT. No definite new infarct is characterized. There is no evidence of intra or extra-axial hemorrhage.  Small chronic lacunar infarcts are noted at the  left basal ganglia. A right central 3.3 cm arachnoid cyst is again noted at the posterior fossa.  The brainstem and fourth ventricle are within normal limits. The third and lateral ventricles are unremarkable in appearance. No midline shift is seen.  There is no  evidence of fracture; visualized osseous structures are unremarkable in appearance. The orbits are within normal limits. The paranasal sinuses and mastoid air cells are well-aerated. No significant soft tissue abnormalities are seen.  IMPRESSION: 1. No definite new infarct seen at this time. 2. Evolving infarct at the anterior right frontal operculum, as previously noted, and at the right insular cortex. The infarct in the insular cortex has increased in size since the prior MRI, now involving portions of the right external capsule, with vague superior extension into the subcortical white matter of the right parietal lobe. Known punctate infarct at the high right parietal lobe is not well seen on CT. No evidence of hemorrhagic transformation at this time. 3. Small chronic lacunar infarcts at the left basal ganglia. 4. Right central 3.3 cm arachnoid cyst again seen at the posterior fossa.  These results were called by telephone at the time of interpretation on 07/29/2014 at 8:03 pm to Dr. Leroy Kennedy, who verbally acknowledged these results.   Electronically Signed   By: Roanna Raider M.D.   On: 07/29/2014 20:05    Assessment: 59 y.o. male with very recent right MCA distribution infarct likely artery to artery embolism in the setting of an occluded right ICA at its origin versus occult a fib (loop recorder implanted), comes in with new findings of dysarthria and left arm weakness. NIHSS 12 ,CT brain without acute abnormality but evidence of evolving infarct at the anterior right frontal operculum, the right insular cortex, with vaguesuperior extension into the subcortical white matter of the right parietal lobe. His last known well is unclear, had very recent stroke with known occluded right ICA, hence no treatment with thrombolysis was offered. Patient has a loop recorder that was placed before discharge from the hospital 6/10 that likely will need to be interrogated. Admit to medicine. Ordered MRI brain to  investigate presence of a new infarct. Continue dual antiplatelet therapy for now. PT, speech therapy consult. Stroke team will follow up tomorrow.  Stroke Risk Factors - HTN, hyperlipidemia, recent right MCA territory infarct, occluded right ICA.  Wyatt Portela, MD [facility] [phone]  07/29/2014, 8:20 PM

## 2014-07-29 NOTE — ED Notes (Signed)
Received call from [employer] - no events recorded and pt is clear to go to MRI.

## 2014-07-29 NOTE — ED Notes (Signed)
Pt arrives to ED from home c/o left arm/leg weakness and numbness and worsening facial numbness. Pt suffered a stroke on Wednesday and was dc'd Friday with residual left sided numbness and facial weakness. Wife states that pt has been lethargic since 1100. At 1842 pts wife noticed that pt was drooling and suffered more weakness.

## 2014-07-30 ENCOUNTER — Encounter (HOSPITAL_COMMUNITY): Payer: Self-pay | Admitting: Internal Medicine

## 2014-07-30 DIAGNOSIS — I63511 Cerebral infarction due to unspecified occlusion or stenosis of right middle cerebral artery: Secondary | ICD-10-CM | POA: Diagnosis not present

## 2014-07-30 DIAGNOSIS — I69354 Hemiplegia and hemiparesis following cerebral infarction affecting left non-dominant side: Secondary | ICD-10-CM | POA: Diagnosis not present

## 2014-07-30 DIAGNOSIS — G8190 Hemiplegia, unspecified affecting unspecified side: Secondary | ICD-10-CM | POA: Diagnosis not present

## 2014-07-30 DIAGNOSIS — I6350 Cerebral infarction due to unspecified occlusion or stenosis of unspecified cerebral artery: Secondary | ICD-10-CM | POA: Diagnosis not present

## 2014-07-30 DIAGNOSIS — R414 Neurologic neglect syndrome: Secondary | ICD-10-CM | POA: Diagnosis not present

## 2014-07-30 DIAGNOSIS — E782 Mixed hyperlipidemia: Secondary | ICD-10-CM | POA: Diagnosis present

## 2014-07-30 DIAGNOSIS — Z7902 Long term (current) use of antithrombotics/antiplatelets: Secondary | ICD-10-CM | POA: Diagnosis not present

## 2014-07-30 DIAGNOSIS — F17210 Nicotine dependence, cigarettes, uncomplicated: Secondary | ICD-10-CM | POA: Diagnosis present

## 2014-07-30 DIAGNOSIS — E785 Hyperlipidemia, unspecified: Secondary | ICD-10-CM | POA: Diagnosis not present

## 2014-07-30 DIAGNOSIS — I639 Cerebral infarction, unspecified: Secondary | ICD-10-CM | POA: Diagnosis not present

## 2014-07-30 DIAGNOSIS — R4781 Slurred speech: Secondary | ICD-10-CM | POA: Diagnosis present

## 2014-07-30 DIAGNOSIS — I6521 Occlusion and stenosis of right carotid artery: Secondary | ICD-10-CM | POA: Diagnosis present

## 2014-07-30 DIAGNOSIS — H5340 Unspecified visual field defects: Secondary | ICD-10-CM | POA: Diagnosis present

## 2014-07-30 DIAGNOSIS — Z7982 Long term (current) use of aspirin: Secondary | ICD-10-CM | POA: Diagnosis not present

## 2014-07-30 DIAGNOSIS — I69392 Facial weakness following cerebral infarction: Secondary | ICD-10-CM | POA: Diagnosis not present

## 2014-07-30 DIAGNOSIS — I10 Essential (primary) hypertension: Secondary | ICD-10-CM | POA: Diagnosis present

## 2014-07-30 DIAGNOSIS — G8194 Hemiplegia, unspecified affecting left nondominant side: Secondary | ICD-10-CM | POA: Diagnosis present

## 2014-07-30 DIAGNOSIS — I69398 Other sequelae of cerebral infarction: Secondary | ICD-10-CM | POA: Diagnosis not present

## 2014-07-30 DIAGNOSIS — Z79899 Other long term (current) drug therapy: Secondary | ICD-10-CM | POA: Diagnosis not present

## 2014-07-30 DIAGNOSIS — I6340 Cerebral infarction due to embolism of unspecified cerebral artery: Secondary | ICD-10-CM | POA: Diagnosis present

## 2014-07-30 LAB — GLUCOSE, CAPILLARY
GLUCOSE-CAPILLARY: 99 mg/dL (ref 65–99)
Glucose-Capillary: 96 mg/dL (ref 65–99)

## 2014-07-30 LAB — LUPUS ANTICOAGULANT PANEL
DRVVT: 46 s (ref 0.0–55.1)
PTT LA: 46.7 s (ref 0.0–50.0)

## 2014-07-30 LAB — LACTIC ACID, PLASMA: LACTIC ACID, VENOUS: 2 mmol/L (ref 0.5–2.0)

## 2014-07-30 MED ORDER — CLOPIDOGREL BISULFATE 75 MG PO TABS
75.0000 mg | ORAL_TABLET | Freq: Every day | ORAL | Status: DC
  Administered 2014-07-30 – 2014-08-01 (×3): 75 mg via ORAL
  Filled 2014-07-30 (×3): qty 1

## 2014-07-30 MED ORDER — SODIUM CHLORIDE 0.9 % IJ SOLN: 3.0000 mL | INTRAMUSCULAR | Status: DC | PRN

## 2014-07-30 MED ORDER — NICOTINE 14 MG/24HR TD PT24
14.0000 mg | MEDICATED_PATCH | Freq: Every day | TRANSDERMAL | Status: DC
  Administered 2014-07-30 – 2014-08-01 (×3): 14 mg via TRANSDERMAL
  Filled 2014-07-30 (×3): qty 1

## 2014-07-30 MED ORDER — STROKE: EARLY STAGES OF RECOVERY BOOK
Freq: Once | Status: AC
  Administered 2014-07-30: 01:00:00

## 2014-07-30 MED ORDER — ASPIRIN EC 81 MG PO TBEC
81.0000 mg | DELAYED_RELEASE_TABLET | Freq: Every day | ORAL | Status: DC
  Administered 2014-07-30 – 2014-08-01 (×3): 81 mg via ORAL
  Filled 2014-07-30 (×3): qty 1

## 2014-07-30 MED ORDER — SODIUM CHLORIDE 0.9 % IV SOLN
Freq: Once | INTRAVENOUS | Status: AC
  Administered 2014-07-30: 01:00:00 via INTRAVENOUS

## 2014-07-30 MED ORDER — SODIUM CHLORIDE 0.9 % IV SOLN
Freq: Once | INTRAVENOUS | Status: AC
  Administered 2014-07-30: 12:00:00 via INTRAVENOUS

## 2014-07-30 MED ORDER — SODIUM CHLORIDE 0.9 % IV SOLN: 250.0000 mL | INTRAVENOUS | Status: DC | PRN

## 2014-07-30 MED ORDER — SODIUM CHLORIDE 0.9 % IJ SOLN
3.0000 mL | Freq: Two times a day (BID) | INTRAMUSCULAR | Status: DC
  Administered 2014-07-30: 3 mL via INTRAVENOUS

## 2014-07-30 MED ORDER — HEPARIN SODIUM (PORCINE) 5000 UNIT/ML IJ SOLN
5000.0000 [IU] | Freq: Three times a day (TID) | INTRAMUSCULAR | Status: DC
  Administered 2014-07-30 – 2014-08-01 (×7): 5000 [IU] via SUBCUTANEOUS
  Filled 2014-07-30 (×7): qty 1

## 2014-07-30 MED ORDER — SENNOSIDES-DOCUSATE SODIUM 8.6-50 MG PO TABS: 1.0000 | ORAL_TABLET | Freq: Every evening | ORAL | Status: DC | PRN

## 2014-07-30 MED ORDER — ATORVASTATIN CALCIUM 40 MG PO TABS
40.0000 mg | ORAL_TABLET | Freq: Every day | ORAL | Status: DC
  Administered 2014-07-30: 40 mg via ORAL
  Filled 2014-07-30: qty 1

## 2014-07-30 NOTE — Progress Notes (Signed)
PT Cancellation Note  Patient Details Name: [name] MRN: [id number] DOB: 1955/07/23   Cancelled Treatment:    Reason Eval/Treat Not Completed: Patient not medically ready (active bedrest orders at this time)   Fabio Asa 07/30/2014, 7:34 AM Charlotte Crumb, PT DPT  [phone]

## 2014-07-30 NOTE — Evaluation (Addendum)
Clinical/Bedside Swallow Evaluation Patient Details  Name: [name] MRN: [id number] Date of Birth: 1956-01-09  Today's Date: 07/30/2014 Time: SLP Start Time (ACUTE ONLY): 0820 SLP Stop Time (ACUTE ONLY): 0845 SLP Time Calculation (min) (ACUTE ONLY): 25 min  Past Medical History:  Past Medical History  Diagnosis Date  . Arthritis   . Hypertension   . Hyperlipidemia    Past Surgical History:  Past Surgical History  Procedure Laterality Date  . Loop recorder implant  07/27/14    LOOP REVEAL LINQ [id number] - [id number]  . Tee without cardioversion N/A 07/27/2014    Procedure: TRANSESOPHAGEAL ECHOCARDIOGRAM (TEE);  Surgeon: Chrystie Nose, MD;  Location: [facility] ENDOSCOPY;  Service: Cardiovascular;  Laterality: N/A;  . Ep implantable device N/A 07/27/2014    Procedure: Loop Recorder Insertion;  Surgeon: Marinus Maw, MD;  Location: [facility] INVASIVE CV LAB;  Service: Cardiovascular;  Laterality: N/A;   HPI:  Pt is a 59 yo male admitted with worsening left facial droop, slurred speech and left sided weakness.  Pmhx significant for hyperlipidemia, CVA last week, HTN and alcohol abuse per chart review as pt stated he drinks 5-18 beers per day and was a current smoker during last admit.   Pt denies dysphagia MR head on 07/25/14 indicated acute non-hemorrhagic infarct of right frontal operculum and anterior portion of insular cortex; Acute punctate infarct in high right parietal lobe near vertex.  Acute moderate RIGHT middle cerebral artery territory infarct  New MRI during this admit showed Acute moderate RIGHT middle cerebral artery territory infarct.  Speech and swallow evaluation ordered = pt failed RNSSS.    Assessment / Plan / Recommendation Clinical Impression  Pt presents with mild oral>pharyngeal dysphagia due to sensorimotor deficits.  Obvious deficits impacting facial/trigeminal/hypoglossal nerves present.  Slow mastication, mildly delayed oral transiting noted with solid/puree more than liquids.   Overt concern for aspiration of thin via straw with sequential boluses noted c/b immediate post=swallow coughing.  Suspect premature spillage of liquid into open larynx.  Small single boluses tolerated well.  Due to oral sensorimotor deficits, recommend dys3/thin.    Reviewed aspiration precautions with pt and family using teach back.  Full supervision indicated due to pt impulsivity.      Aspiration Risk  Moderate    Diet Recommendation Dysphagia 3 (Mech soft);Thin   Medication Administration: Whole meds with liquid Compensations: Slow rate;Small sips/bites;Check for pocketing    Other  Recommendations Oral Care Recommendations: Oral care BID   Follow Up Recommendations       Frequency and Duration min 1 x/week  2 weeks   Pertinent Vitals/Pain Afebrile, decreased     Swallow Study Prior Functional Status  Type of Home: House  Lives With: Spouse;Other (Comment) Available Help at Discharge: Family    General Date of Onset: 07/30/14 Other Pertinent Information: Pt is a 59 yo male admitted with worsening left facial droop, slurred speech and left sided weakness.  Pmhx significant for hyperlipidemia, CVA last week, HTN and alcohol abuse per chart review as pt stated he drinks 5-18 beers per day and was a current smoker during last admit.   Pt denies dysphagia MR head on 07/25/14 indicated acute non-hemorrhagic infarct of right frontal operculum and anterior portion of insular cortex; Acute punctate infarct in high right parietal lobe near vertex.  Acute moderate RIGHT middle cerebral artery territory infarct  New MRI during this admit showed Acute moderate RIGHT middle cerebral artery territory infarct.  Speech and swallow evaluation ordered = pt failed RNSSS.  Type of Study: Bedside swallow evaluation Diet Prior to this Study: NPO Temperature Spikes Noted: No Respiratory Status: Room air History of Recent Intubation: No Behavior/Cognition: Alert;Requires cueing;Distractible Oral  Cavity - Dentition: Adequate natural dentition/normal for age Self-Feeding Abilities: Able to feed self Patient Positioning: Upright in bed Baseline Vocal Quality: Low vocal intensity Volitional Cough: Weak Volitional Swallow: Able to elicit    Oral/Motor/Sensory Function Overall Oral Motor/Sensory Function: Impaired Labial ROM: Reduced left Labial Symmetry: Abnormal symmetry left Labial Strength: Reduced Labial Sensation: Reduced Lingual ROM: Reduced left Lingual Symmetry: Within Functional Limits Lingual Strength: Reduced Lingual Sensation: Within Functional Limits Facial ROM: Reduced left Facial Symmetry: Left droop;Other (Comment) (minimal) Facial Strength: Reduced Facial Sensation: Reduced Velum: Within Functional Limits Mandible: Within Functional Limits   Ice Chips Ice chips: Impaired Oral Phase Impairments: Reduced lingual movement/coordination;Impaired anterior to posterior transit Oral Phase Functional Implications: Prolonged oral transit Pharyngeal Phase Impairments: Suspected delayed Swallow   Thin Liquid Thin Liquid: Impaired Presentation: Cup;Self Fed;Straw Oral Phase Impairments: Reduced lingual movement/coordination;Impaired anterior to posterior transit;Reduced labial seal Oral Phase Functional Implications: Prolonged oral transit;Left anterior spillage Pharyngeal  Phase Impairments: Cough - Immediate Other Comments: cough immediately with sequential swallows of thin, small single boluses tolerated well    Nectar Thick Nectar Thick Liquid: Not tested   Honey Thick Honey Thick Liquid: Not tested   Puree Puree: Impaired Presentation: Self Fed;Spoon Oral Phase Impairments: Reduced lingual movement/coordination;Impaired anterior to posterior transit Oral Phase Functional Implications: Prolonged oral transit   Solid   GO Functional Assessment Tool Used: clinical judgement Functional Limitations: Swallowing Swallow Current Status (Y1749): At least 40 percent  but less than 60 percent impaired, limited or restricted Swallow Goal Status [phone]): At least 20 percent but less than 40 percent impaired, limited or restricted  Solid: Impaired Oral Phase Impairments: Impaired mastication;Reduced lingual movement/coordination;Impaired anterior to posterior transit       Donavan Burnet, MS [facility] SLP [phone]

## 2014-07-30 NOTE — Evaluation (Signed)
Occupational Therapy Evaluation Patient Details Name: [name] MRN: [id number] DOB: 1955-08-15 Today's Date: 07/30/2014    History of Present Illness 59 yo male admitted with L facial droop, slurred, and L sided weakness. MRI (+) acute R MCA ( new R basal ganglia) Recent d/c 07/27/14 from [facility] s/p loop recorder, Tee, CVA workup. PMH: arthritis, HTN   Clinical Impression   PT admitted with RMCA. Pt currently with functional limitiations due to the deficits listed below (see OT problem list). Pt known to therapist from recent d/c home. Pt now with incr deficits ( visual, cognitive, and balance deficits) affecting all adls. Pt will benefit from skilled OT to increase their independence and safety with adls and balance to allow discharge CIR. Pt could benefit from adl retraining and cognitive recovery.      Follow Up Recommendations  CIR    Equipment Recommendations  Other (comment) (defer)    Recommendations for Other Services Rehab consult     Precautions / Restrictions Precautions Precautions: Fall Restrictions Weight Bearing Restrictions: No      Mobility Bed Mobility Overal bed mobility: Needs Assistance Bed Mobility: Supine to Sit     Supine to sit: Mod assist     General bed mobility comments: cues to sequence and to achieve upright sitting. Pt with decr awareness to weakness on L side of body  Transfers Overall transfer level: Needs assistance Equipment used: None Transfers: Sit to/from Stand Sit to Stand: Max assist         General transfer comment: pt required L LE blocked max cues for safety . pt able to static stand with max constant cueing. pt reaching with R UE. pt able to step with R LE with cues. Pt no actively moving L LE but not buckling with static standing    Balance Overall balance assessment: Needs assistance Sitting-balance support: Single extremity supported;Feet supported Sitting balance-Leahy Scale: Poor   Postural control: Posterior  lean;Left lateral lean Standing balance support: Single extremity supported;During functional activity Standing balance-Leahy Scale: Zero                              ADL Overall ADL's : Needs assistance/impaired                 Upper Body Dressing : Maximal assistance;Bed level Upper Body Dressing Details (indicate cue type and reason): pt unaware L arm dressed and attempting to pull at gown again to dress it.  Lower Body Dressing: Maximal assistance                 General ADL Comments: Pt will require (A) for all adls due to balance deficits, impulsive and L inattention. Pt required additional cues to scan tray to locate phone. Pt required cues to turn head to read clock. Pt only reading the R side of phone screen. Pt unaware of neglect of L side even with cues. Pt repeating statement with no awareness to error. pt wet on L side and gown changed. Pt unable to feel wet clothing.      Vision Vision Assessment?: Yes;Vision impaired- to be further tested in functional context Alignment/Gaze Preference: Gaze right Tracking/Visual Pursuits: Unable to hold eye position out of midline;Requires cues, head turns, or add eye shifts to track Visual Fields: Impaired-to be further tested in functional context Additional Comments: pt with eyes deviated to the R at rest. Pt with poor return demo of visual tracking during session. pt  with functional test neglecting the L visual fields. Pt only reading objects on the R. Ot to further assess vision.    Perception Perception Perception Tested?: Yes Perception Deficits: Inattention/neglect Inattention/Neglect: Does not attend to left visual field;Does not attend to left side of body   Praxis      Pertinent Vitals/Pain Pain Assessment: No/denies pain     Hand Dominance Right   Extremity/Trunk Assessment Upper Extremity Assessment Upper Extremity Assessment: LUE deficits/detail LUE Deficits / Details: tone noted, muscle  belly tapping in supine able to activate tricep x2, no active movement in L hand noted LUE Sensation: decreased light touch;decreased proprioception LUE Coordination: decreased fine motor;decreased gross motor   Lower Extremity Assessment Lower Extremity Assessment: Defer to PT evaluation;LLE deficits/detail LLE Deficits / Details: pt noted to sit EOB and chair and extend knee with command.    Cervical / Trunk Assessment Cervical / Trunk Assessment: Normal   Communication Communication Communication: Expressive difficulties   Cognition Arousal/Alertness: Awake/alert Behavior During Therapy: Impulsive Overall Cognitive Status: Impaired/Different from baseline Area of Impairment: Safety/judgement;Awareness;Problem solving;Following commands;Attention;Orientation Orientation Level: Disoriented to;Time;Situation Current Attention Level: Sustained Memory: Decreased recall of precautions;Decreased short-term memory Following Commands: Follows one step commands inconsistently;Follows one step commands with increased time Safety/Judgement: Decreased awareness of safety;Decreased awareness of deficits Awareness: Intellectual Problem Solving: Slow processing;Decreased initiation;Difficulty sequencing General Comments: pt with L inattention, impulisve at times, pt demonstrates visual deficits and lack of awareness   General Comments       Exercises       Shoulder Instructions      Home Living Family/patient expects to be discharged to:: Private residence Living Arrangements: Spouse/significant other;Other (Comment) Available Help at Discharge: Family Type of Home: House Home Access: Stairs to enter [employer] of Steps: 2 Entrance Stairs-Rails: Right;Left Home Layout: One level     Bathroom Shower/Tub: Tub/shower unit;Curtain Shower/tub characteristics: Engineer, building services: Standard     Home Equipment: None      Lives With: Spouse;Other (Comment)    Prior  Functioning/Environment Level of Independence: Independent        Comments: works as Merchandiser, retail at [employer]    OT Diagnosis: Generalized weakness;Cognitive deficits;Disturbance of vision;Hemiplegia non-dominant side;Altered mental status   OT Problem List: Decreased strength;Decreased range of motion;Decreased activity tolerance;Impaired balance (sitting and/or standing);Impaired vision/perception;Decreased coordination;Decreased cognition;Decreased safety awareness;Decreased knowledge of use of DME or AE;Decreased knowledge of precautions;Impaired UE functional use   OT Treatment/Interventions: Self-care/ADL training;Therapeutic exercise;Neuromuscular education;DME and/or AE instruction;Therapeutic activities;Cognitive remediation/compensation;Visual/perceptual remediation/compensation;Patient/family education;Balance training    OT Goals(Current goals can be found in the care plan section) Acute Rehab OT Goals Patient Stated Goal: to lay down OT Goal Formulation: With patient/family Time For Goal Achievement: 08/13/14 Potential to Achieve Goals: Good  OT Frequency: Min 3X/week   Barriers to D/C:            Co-evaluation              End of Session Equipment Utilized During Treatment: Gait belt Nurse Communication: Mobility status;Precautions  Activity Tolerance: Patient tolerated treatment well Patient left: in chair;with call bell/phone within reach;with chair alarm set;with family/visitor present   Time: 7341-9379 ([id number] educate wife) OT Time Calculation (min): 26 min Charges:  OT General Charges $OT Visit: 1 Procedure OT Evaluation $Initial OT Evaluation Tier I: 1 Procedure OT Treatments $Self Care/Home Management : 8-22 mins $Therapeutic Activity: 8-22 mins G-Codes:    Boone Master B 08-18-2014, 12:25 PM  Pager: [phone]

## 2014-07-30 NOTE — Progress Notes (Signed)
Family Medicine Teaching Service Daily Progress Note Intern Pager: [phone]  Patient name: [name] Medical record number: [id number] Date of birth: 1955-06-22 Age: 59 y.o. Gender: male  Primary Care Provider: Dow Adolph, MD Consultants: Neurology, CIR Code Status: Full  Pt Overview and Major Events to Date:  6/12: Trouble with mobility, quiet, L facial droop, slurred speech, L arm paresis  Assessment and Plan: [name] is a 59 y.o. male presenting with concern for acute stroke. PMH is significant for CVA, HTN, HLD, Tobacco abuse, EtOH abuse  Left-sided hemiparesis: this is a new finding from recent stroke (R MCA infract). Possible new stroke vs extension of previous stroke. Neurology consulted in the ED. Patient had a loop recorder implanted during last hospitalization however no events noted on admission. Found to have 100% occlusion of right internal carotid on CTA previous admission. TEE on previous admission without evidence of atrial thrombus or vegetations. Because of multiple areas of infarction, concern for embolism but unknown location. Although would not expect it from a 100% occluded vessel, possible that may be the source. Hemoglobin A1c of 6. LDL of 97. MRI revealed propagation of the R MCA infract into the operculum with new involvement of the insula and RIGHT basal ganglia. Per [employer], no events on loop recorder. - continue to monitor on telemetry  - frequent neuro checks - Neurology following, appreciate  recommendations - SLP evaluation: dysphagia 3 diet - continue statin - continue Plavix/ASA - PT/OT: CIR  #Hypertension: normotensive, slightly hypertensive (147/93 highest)  - allow for permissive hypertension - hold lisinopril-hctz, would consider holding this on discharge if BPs stay stable given concerns for hypotension with re-initiation of these on previous discharge.  - follow blood pressures  #Tobacco abuse: patient quit smoking since previous  admission - nicotine patch 21mg  daily  #Elevated lactic acid: initially elevated to 2.69 on admission. Patient currently stable. Normotensive. Possibly related to stroke. Improved to 2.0 this AM   FEN/GI: Dysphagia 3, KVO Pophylaxis: heparin subq  Disposition: Most likely CIR pending evaluation, otherwise SNF  Subjective:  Patient without pain. Unable to move L arm.   Objective: Temp:  [97.5 F (36.4 C)-99.2 F (37.3 C)] 97.5 F (36.4 C) (06/13 0600) Pulse Rate:  [56-71] 64 (06/13 0600) Resp:  [13-19] 18 (06/13 0600) BP: (101-167)/(44-93) 147/93 mmHg (06/13 0600) SpO2:  [94 %-100 %] 100 % (06/13 0600) Weight:  [169 lb (76.658 kg)] 169 lb (76.658 kg) (06/12 2015) Physical Exam: General: Lying in bed.  Cardiovascular: RRR, no m/r/g noted. No pitting edema Respiratory: No increased WOB. CTAB. No wheezing, rhonchi. Or crackles noted. Abdomen: +BS, soft, ND/NT Extremities: No gross deformities  Neuro: A&O x 4. EOMI. PERRL. L sided facial droop most noteable in the lips with smile, not promienet int the forehead. Dysarthria improved.  Decreased sensation to touch in the V1-V3 distribution on the L. Unable to shug on L. Unable to move L UE. 5/5 strength in the R UE. 4/5 strength in the L LE, 5/5 on the R. Decreased sensation on the L UE (does not respond to pain), as well as decreased sensation over the L LE.  Laboratory:  Recent Labs Lab 07/25/14 1531 07/25/14 1537 07/29/14 1945 07/29/14 1949  WBC 12.7*  --  12.0*  --   HGB 14.8 15.3 15.8 17.0  HCT 42.4 45.0 44.0 50.0  PLT 283  --  282  --     Recent Labs Lab 07/25/14 1531 07/25/14 1537 07/29/14 1945 07/29/14 1949  NA 133* 134* 131*  135  K 3.9 4.0 3.8 3.8  CL 101 102 97* 99*  CO2 24  --  20*  --   BUN 19 22* 14 16  CREATININE 0.95 0.90 1.30* 1.20  CALCIUM 9.7  --  9.7  --   PROT 7.4  --  8.5*  --   BILITOT 0.6  --  0.7  --   ALKPHOS 62  --  65  --   ALT 33  --  36  --   AST 26  --  34  --   GLUCOSE 87 89 121*  123*   Risk Stratification Labs  TSH    Component Value Date/Time   TSH 3.239 10/10/2012 1012   Hemoglobin A1C    Component Value Date/Time   HGBA1C 6.0* 07/26/2014 0451   Lipid Panel     Component Value Date/Time   CHOL 192 07/26/2014 0437   TRIG 305* 07/26/2014 0437   HDL 34* 07/26/2014 0437   CHOLHDL 5.6 07/26/2014 0437   VLDL 61* 07/26/2014 0437   LDLCALC 97 07/26/2014 0437      Imaging/Diagnostic Tests: 07/29/2014 CLINICAL DATA: Code stroke. Left-sided weakness and left facial droop. Confusion and diaphoresis. Recent CVA. Initial encounter. EXAM: CT HEAD WITHOUT CONTRAST TECHNIQUE: Contiguous axial images were obtained from the base of the skull through the vertex without intravenous contrast. COMPARISON: CT/CTA of the head performed 07/26/2014, and MRI of the brain performed 07/25/2014 FINDINGS: There is an evolving infarct at the anterior right frontal operculum, as previously noted, and at the right insular cortex. The infarct at the insular cortex has increased in size since the prior MRI, involving portions of the right external capsule, with vague superior extension into the subcortical white matter of the right parietal lobe. The known punctate infarct at the high right parietal lobe is not well seen on CT. No definite new infarct is characterized. There is no evidence of intra or extra-axial hemorrhage. Small chronic lacunar infarcts are noted at the left basal ganglia. A right central 3.3 cm arachnoid cyst is again noted at the posterior fossa. The brainstem and fourth ventricle are within normal limits. The third and lateral ventricles are unremarkable in appearance. No midline shift is seen. There is no evidence of fracture; visualized osseous structures are unremarkable in appearance. The orbits are within normal limits. The paranasal sinuses and mastoid air cells are well-aerated. No significant soft tissue abnormalities are seen. IMPRESSION: 1. No definite  new infarct seen at this time. 2. Evolving infarct at the anterior right frontal operculum, as previously noted, and at the right insular cortex. The infarct in the insular cortex has increased in size since the prior MRI, now involving portions of the right external capsule, with vague superior extension into the subcortical white matter of the right parietal lobe. Known punctate infarct at the high right parietal lobe is not well seen on CT. No evidence of hemorrhagic transformation at this time. 3. Small chronic lacunar infarcts at the left basal ganglia. 4. Right central 3.3 cm arachnoid cyst again seen at the posterior fossa   MRI: Acute moderate RIGHT middle cerebral artery territory infarct (further involvement of the operculum, new involvement of the insula and RIGHT basal ganglia), propagated from prior imaging. No hemorrhagic conversion.Suspected occluded RIGHT internal carotid artery, with slow flow versus occluded RIGHT middle cerebral artery.  Joanna Puff, MD 07/30/2014, 6:32 AM PGY-1, [facility] Family Medicine FPTS Intern pager: [phone], text pages welcome

## 2014-07-30 NOTE — Progress Notes (Signed)
Physical medicine rehabilitation consult requested chart reviewed. As noted will allow physical and occupational therapy evaluations to first be completed and then follow-up at that time with appropriate recommendations

## 2014-07-30 NOTE — Progress Notes (Signed)
STROKE TEAM PROGRESS NOTE   HISTORY [name] is an 59 y.o. male with a past medical history significant for HTN, hyperlipidemia, right MCA territory infarct on 6/8 with single right M2 branch occlusion identified just beyond the bifurcation and occluded right ICA at its origin, s/p loop recorder placement, returns to the ED for evaluation of left face weakness, left hemiparesis, and dysarthria. Wife indicated that patient was doing reasonably well after discharge from the hospital 2 days, ut since this morning 07/29/2014, time unknown (LKW) he has been having some trouble with mobility, quieter that usual, and then throughout the course of the day she noticed that he was drooling, his left face was droopy, he was slurring his words, and approximately by 6:42 was not able to use his left arm. Wife said that the left face weakness and left arm weakness are new. Denies HA, vertigo, double vision, or visual impairment. NIHSS 12. CT brain showed no acute abnormality. In comparison to last MRI brain, there is an evolving infarct at the anterior right frontal operculum, the right insular cortex, with vaguesuperior extension into the subcortical white matter of the right parietal lobe. Patient is on aspirin and plavix since recent discharge from the hospital for large vessel disease ( R ICA occlusion). Patient was not administered TPA secondary to recent stroke. He was admitted for further evaluation and treatment.   SUBJECTIVE (INTERVAL HISTORY) His wife and family are at the bedside.  Overall he feels his condition is significantly worse than it was when he was discharged. He cannot move his L arm.   OBJECTIVE Temp:  [97.5 F (36.4 C)-99.2 F (37.3 C)] 97.5 F (36.4 C) (06/13 0600) Pulse Rate:  [49-78] 78 (06/13 1000) Cardiac Rhythm:  [-] Normal sinus rhythm (06/13 0830) Resp:  [13-19] 18 (06/13 0600) BP: (101-167)/(44-93) 132/66 mmHg (06/13 1000) SpO2:  [94 %-100 %] 99 % (06/13 1000) Weight:   [76.658 kg (169 lb)] 76.658 kg (169 lb) (06/12 2015)   Recent Labs Lab 07/27/14 1156 07/27/14 1632 07/29/14 1942 07/29/14 1954 07/30/14 0607  GLUCAP 73 109* 126* 107* 99    Recent Labs Lab 07/25/14 1531 07/25/14 1537 07/29/14 1945 07/29/14 1949  NA 133* 134* 131* 135  K 3.9 4.0 3.8 3.8  CL 101 102 97* 99*  CO2 24  --  20*  --   GLUCOSE 87 89 121* 123*  BUN 19 22* 14 16  CREATININE 0.95 0.90 1.30* 1.20  CALCIUM 9.7  --  9.7  --     Recent Labs Lab 07/25/14 1531 07/29/14 1945  AST 26 34  ALT 33 36  ALKPHOS 62 65  BILITOT 0.6 0.7  PROT 7.4 8.5*  ALBUMIN 3.9 4.1    Recent Labs Lab 07/25/14 1531 07/25/14 1537 07/29/14 1945 07/29/14 1949  WBC 12.7*  --  12.0*  --   NEUTROABS 7.8*  --  7.4  --   HGB 14.8 15.3 15.8 17.0  HCT 42.4 45.0 44.0 50.0  MCV 92.6  --  91.7  --   PLT 283  --  282  --    No results for input(s): CKTOTAL, CKMB, CKMBINDEX, TROPONINI in the last 168 hours.  Recent Labs  07/29/14 1945  LABPROT 13.5  INR 1.01   No results for input(s): COLORURINE, LABSPEC, PHURINE, GLUCOSEU, HGBUR, BILIRUBINUR, KETONESUR, PROTEINUR, UROBILINOGEN, NITRITE, LEUKOCYTESUR in the last 72 hours.  Invalid input(s): APPERANCEUR     Component Value Date/Time   CHOL 192 07/26/2014 0437   TRIG 305*  07/26/2014 0437   HDL 34* 07/26/2014 0437   CHOLHDL 5.6 07/26/2014 0437   VLDL 61* 07/26/2014 0437   LDLCALC 97 07/26/2014 0437   Lab Results  Component Value Date   HGBA1C 6.0* 07/26/2014      Component Value Date/Time   LABOPIA NONE DETECTED 07/26/2014 1445   COCAINSCRNUR NONE DETECTED 07/26/2014 1445   LABBENZ NONE DETECTED 07/26/2014 1445   AMPHETMU NONE DETECTED 07/26/2014 1445   THCU NONE DETECTED 07/26/2014 1445   LABBARB NONE DETECTED 07/26/2014 1445    No results for input(s): ETH in the last 168 hours.   Ct Head (brain) Wo Contrast 07/29/2014    1. No definite new infarct seen at this time. 2. Evolving infarct at the anterior right frontal  operculum, as previously noted, and at the right insular cortex. The infarct in the insular cortex has increased in size since the prior MRI, now involving portions of the right external capsule, with vague superior extension into the subcortical white matter of the right parietal lobe. Known punctate infarct at the high right parietal lobe is not well seen on CT. No evidence of hemorrhagic transformation at this time. 3. Small chronic lacunar infarcts at the left basal ganglia. 4. Right central 3.3 cm arachnoid cyst again seen at the posterior fossa.   07/25/2014 1. Anterior division right MCA territory cortically based infarct. ASPECTS score = 8. 2. No associated hemorrhage or mass effect.  Mr Brain Wo Contrast 07/29/2014    Acute moderate RIGHT middle cerebral artery territory infarct (further involvement of the operculum, new involvement of the insula and RIGHT basal ganglia), propagated from prior imaging. No hemorrhagic conversion.  Suspected occluded RIGHT internal carotid artery, with slow flow versus occluded RIGHT middle cerebral artery.   07/25/2014 1. Acute nonhemorrhagic infarct of the right frontal operculum and anterior portion of the insular cortex. 2. Acute punctate infarct in the high right parietal lobe near the vertex. 3. Slow or occluded flow in the right internal carotid artery to the level of the posterior communicating artery. 4. Age advanced periventricular T2 changes likely reflect the sequela of chronic microvascular ischemia.   CTA Head & Neck  07/26/2014 1. Right ICA is occluded at its origin with no reconstituted flow in the neck. Moderate to poor reconstitution of the distal right ICA siphon. Right MCA origin and M1 segment are patent. Single right M2 branch occlusion identified just beyond the bifurcation. 2. No other major circle of Willis branch occlusion. Stenosis versus non dominance of the right ACA A1 segment. 3. Left ICA origin and bulb atherosclerosis without  hemodynamically significant stenosis. Up to moderate left vertebral artery V1 segment stenosis.  LE Venous dopplers No DVT  2D Echocardiogram  07/26/2014 - Left ventricle: The cavity size was normal. Systolic function was vigorous. The estimated ejection fraction was in the range of 65%to 70%. Wall motion was normal; there were no regional wallmotion abnormalities. Doppler parameters are consistent with abnormal left ventricular relaxation (grade 1 diastolicdysfunction). There was no evidence of elevated ventricularfilling pressure by Doppler parameters. - Aortic root: The aortic root was normal in size. - Ascending aorta: The ascending aorta was normal in size. - Mitral valve: There was mild regurgitation. - Left atrium: The atrium was mildly dilated. - Right ventricle: The cavity size was normal. Wall thickness wasnormal. Systolic function was normal. - Right atrium: The atrium was normal in size. - Tricuspid valve: There was mild regurgitation. - Pericardium, extracardiac: The pericardium was normal inappearance.  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 . Afebrile. Head is nontraumatic. Neck is supple without bruit.    Cardiac exam no murmur or gallop. Lungs are clear to auscultation. Distal pulses are well felt. Neurological Exam : Awake alert oriented 3. Right gaze preference but able to look to the left past midline. Dense left homonymous hemianopsia. Fundi were not visualized. Pupils equal reactive. Speech is normal without aphasia or dysarthria. Left lower facial weakness. Tongue midline. Left upper extremity plegic with 0/5  strength. Left lower extremity 4/5 strength with weakness of hip flexors and ankle dorsiflexors. Left hemi-body sensory loss. Deep tendon reflexes normal on the right and depression the left. Left plantar upgoing right downgoing. Gait was not tested. ASSESSMENT/PLAN [name] is a 59 y.o. male with history of HTN, hyperlipidemia, right MCA territory embolic infarct on 6/82016 with single right M2 branch occlusion identified just beyond the bifurcation and occluded right ICA at its origin, s/p loop recorder placement, presenting with worsening left face weakness, left hemiparesis, and dysarthria. He did not receive IV t-PA due to recent stroke.   Stroke:  Extension of Non-dominant right MCA infarct, felt to be embolic from stump emboli from R ICA occlusion  Resultant  Left hemiparesis, arm > leg, Left facial, L inattention and neglect, L field cut  MRI  RIGHT middle cerebral artery territory infarct (further involvement of the operculum, new involvement of the insula and RIGHT basal ganglia), R ICA occlusion, R MCA slow flow vs occlusion  Loop interrogation done 07/29/2014 does not show atrial fibrillation  Heparin 5000 units sq tid for VTE prophylaxis DIET DYS 3 Room service appropriate?: Yes; Fluid consistency:: Thin  aspirin 81 mg orally every day and clopidogrel 75 mg orally every day prior to admission for new finding R ICA occlusion, now on aspirin 81 mg orally every day and clopidogrel 75 mg orally every day  Ongoing aggressive stroke risk factor management  Therapy recommendations:  CIR. Consult pending   Disposition:  pending   Hypertension  Home meds:   Lisinopril/HCTZ  Ok to resume from neuro standpoint  Hyperlipidemia  Home meds:  Lipitor, resumed in hospital  Continue statin at discharge  Other Stroke Risk Factors  Cigarette smoker, advised to stop smoking  ETOH use. Patient advised only 2 drinks/day. Defined drink for pt and family.  NO FURTHER STROKE  WORKUP INDICATED  Patient has a 10-15% risk of having another stroke over the next year, the highest risk is within 2 weeks of the most recent stroke  Ongoing risk factor control by Primary Care Physician  Stroke Service will sign off. Please call should any needs arise.  Follow-up Stroke Clinic at [facility] with Dr. Marvel Plan in 2 months, as ordered at time of discharge last week. (office has order and will contact pt)  Hospital day # 0  [facility] See Amion for Pager information 07/30/2014 11:37 AM  I have personally examined this patient, reviewed notes, independently viewed imaging studies, participated in medical decision making and plan of care. I have made any additions or clarifications directly to the above note. Agree with note above. Patient had recent admission  for right MCA branch infarct secondary to right ICA occlusion but has returned now with extension and increase right hemispheric infarcts likely due to distal embolization from proximal right ICA occlusion. Recorder interrogation has not revealed any atrial fibrillation. The patient unfortunately remains at risk for recurrent strokes, TIAs or neurological worsening. Continue aspirin and Plavix x 3 months and optimize blood pressure and fluid and hydration status. Discussed with patient and wife and answered questions.  Delia Heady, MD Medical Director [facility] Pager: [phone] 07/30/2014 3:55 PM    To contact Stroke Continuity provider, please refer to [email or web address]. After hours, contact General Neurology

## 2014-07-30 NOTE — Consult Note (Signed)
Physical Medicine and Rehabilitation Consult Reason for Consult: Acute right MCA infarct Referring Physician: [facility]   HPI: [name] is a 59 y.o. right handed male with history of hypertension as well as hyperlipidemia with right MCA territory infarct on 07/25/2014 with single right M2 branch occlusion identified just beyond the bifurcation and occluded right ICA origin. Underwent loop recorder placement per Dr. Ladona Ridgel and discharge to home on aspirin as well as Plavix 07/27/2014 independent without assistive device. Patient was independent prior to initial admission 07/25/2014 working full time living with his wife. Presented 07/29/2014 with increasing left-sided weakness and slurred speech. MRI of the brain showed acute moderate right middle cerebral artery territory infarct further involvement of the operculum, new involvement of the insula and right basal ganglia propagated from prior imaging. Recent echocardiogram with ejection fraction of 70% grade 1 diastolic dysfunction. Recent venous Doppler studies negative for DVT. Patient did not receive TPA. Remains on aspirin and Plavix therapy per neurology services. Subcutaneous heparin added for DVT prophylaxis. Tolerating a mechanical soft diet. Physical and occupational therapy evaluations completed 07/30/2014 with recommendations of physical medicine rehabilitation consult.   Review of Systems  Constitutional: Negative for fever and chills.  HENT: Negative for hearing loss.   Eyes: Negative for blurred vision and double vision.  Respiratory: Negative for cough and shortness of breath.   Cardiovascular: Negative for leg swelling.  Gastrointestinal: Positive for heartburn, nausea and constipation.  Musculoskeletal: Positive for myalgias.  Skin: Negative for rash.  Neurological: Positive for dizziness and headaches. Negative for tingling and weakness.   Past Medical History  Diagnosis Date  . Arthritis   . Hypertension   .  Hyperlipidemia    Past Surgical History  Procedure Laterality Date  . Loop recorder implant  07/27/14    LOOP REVEAL LINQ ZOX09 - [id number]  . Tee without cardioversion N/A 07/27/2014    Procedure: TRANSESOPHAGEAL ECHOCARDIOGRAM (TEE);  Surgeon: Chrystie Nose, MD;  Location: [facility] ENDOSCOPY;  Service: Cardiovascular;  Laterality: N/A;  . Ep implantable device N/A 07/27/2014    Procedure: Loop Recorder Insertion;  Surgeon: Marinus Maw, MD;  Location: [facility] INVASIVE CV LAB;  Service: Cardiovascular;  Laterality: N/A;   Family History  Problem Relation Age of Onset  . Cancer Father   . Heart attack Brother    Social History:  reports that he has been smoking Cigarettes.  He has a 20 pack-year smoking history. He does not have any smokeless tobacco history on file. He reports that he drinks about 10.8 oz of alcohol per week. He reports that he does not use illicit drugs. Allergies: No Known Allergies Medications Prior to Admission  Medication Sig Dispense Refill  . aspirin EC 81 MG EC tablet Take 1 tablet (81 mg total) by mouth daily. 30 tablet 0  . atorvastatin (LIPITOR) 40 MG tablet Take 1 tablet (40 mg total) by mouth daily. 30 tablet 5  . clopidogrel (PLAVIX) 75 MG tablet Take 1 tablet (75 mg total) by mouth daily. 30 tablet 0  . ibuprofen (ADVIL,MOTRIN) 200 MG tablet Take 400 mg by mouth every 6 (six) hours as needed (pain).     Marland Kitchen lisinopril-hydrochlorothiazide (PRINZIDE,ZESTORETIC) 20-12.5 MG per tablet Take 1 tablet by mouth daily. 30 tablet 5    Home: Home Living Family/patient expects to be discharged to:: Private residence Living Arrangements: Spouse/significant other, Other (Comment) Available Help at Discharge: Family Type of Home: House Home Access: Stairs to enter [employer] of Steps: 2 Entrance  Stairs-Rails: Right, Left Home Layout: One level Home Equipment: None  Lives With: Spouse, Other (Comment)  Functional History: Prior Function Level of Independence:  Independent Comments: works as Merchandiser, retail at [employer] Status:  Mobility: Bed Mobility Overal bed mobility: Needs Assistance Bed Mobility: Sit to Supine Supine to sit: Mod assist Sit to supine: Mod assist General bed mobility comments: VCs for positioning, assist for safety and mobility, patient very impulsive with poor ability to follow comands for return to supine, attempting to lay down sideways towards the left without fully being on the bed Transfers Overall transfer level: Needs assistance Equipment used: 1 person hand held assist Transfers: Sit to/from Stand Sit to Stand: Max assist General transfer comment: Patient significantly impulsive, able to power to standing but required max assist for safety and mobility, patient attempting to launch himself out of chair ov 3 occassions with no recognition of positioning re: left side.  Ambulation/Gait Ambulation/Gait assistance: Max assist Ambulation Distance (Feet): 4 Feet Assistive device: 1 person hand held assist General Gait Details: required full body support with nearly fally, manual assist to hold upright, manual and verbal cues for initiation of step, Patient extremely impulsive and lacks insight and awarenss ZO:[id number] left side    ADL: ADL Overall ADL's : Needs assistance/impaired Upper Body Dressing : Maximal assistance, Bed level Upper Body Dressing Details (indicate cue type and reason): pt unaware L arm dressed and attempting to pull at gown again to dress it.  Lower Body Dressing: Maximal assistance General ADL Comments: Pt will require (A) for all adls due to balance deficits, impulsive and L inattention. Pt required additional cues to scan tray to locate phone. Pt required cues to turn head to read clock. Pt only reading the R side of phone screen. Pt unaware of neglect of L side even with cues. Pt repeating statement with no awareness to error. pt wet on L side and gown changed. Pt unable to feel wet  clothing.   Cognition: Cognition Overall Cognitive Status: Impaired/Different from baseline Arousal/Alertness: Awake/alert Orientation Level: Oriented X4 Attention: Sustained, Selective Sustained Attention: Appears intact Selective Attention: Impaired (? behavior component as pt required cues to turn off tv volume to participate in eval) Selective Attention Impairment: Verbal basic, Functional basic Awareness: Impaired Awareness Impairment: Intellectual impairment (decr awareness to attended to left deficits) Problem Solving: Impaired (admitted to need to use call bell for assist - needed assist to locate in bed *was on his left) Problem Solving Impairment: Functional basic Behaviors: Impulsive Cognition Arousal/Alertness: Awake/alert Behavior During Therapy: Impulsive Overall Cognitive Status: Impaired/Different from baseline Area of Impairment: Attention, Following commands, Safety/judgement, Awareness, Problem solving Orientation Level: Disoriented to, Time, Situation Current Attention Level: Sustained Memory: Decreased recall of precautions, Decreased short-term memory Following Commands: Follows one step commands inconsistently, Follows one step commands with increased time Safety/Judgement: Decreased awareness of safety, Decreased awareness of deficits Awareness: Intellectual Problem Solving: Slow processing, Decreased initiation, Difficulty sequencing General Comments: Patient oreiented x3 this session but pt remains with L inattention, impulisve at times, pt demonstrates visual deficits and lack of awareness  Blood pressure 120/70, pulse 59, temperature 97.9 F (36.6 C), temperature source Oral, resp. rate 18, height 5\' 6"  (1.676 m), weight 76.658 kg (169 lb), SpO2 97 %. Physical Exam  Constitutional: He appears well-developed.  HENT:  Left facial droop  Eyes:  Pupils reactive to light  Neck: Normal range of motion. Neck supple. No thyromegaly present.  Cardiovascular:  Normal rate and regular rhythm.   Respiratory:  Effort normal and breath sounds normal. No respiratory distress.  GI: Soft. Bowel sounds are normal. He exhibits no distension.  Musculoskeletal: He exhibits no edema or tenderness.  Neurological:  Lethargic but arousable. Right gaze preference. Provide his name and age date of birth and follow simple commands. Fair awareness of deficits. LUE 2 delt, 2- bic, 2- tricep, trace wrist, 0 fingers. LLE: 2+ to 3/5 HF, KE and ADF/APF. Decreased PP and LT left arm and leg. Left central 7 and tongue deviation.  Skin: Skin is warm and dry.  Psychiatric:  flat    Results for orders placed or performed during the hospital encounter of 07/29/14 (from the past 24 hour(s))  CBG monitoring, ED     Status: Abnormal   Collection Time: 07/29/14  7:42 PM  Result Value Ref Range   Glucose-Capillary 126 (H) 65 - 99 mg/dL   Comment 1 Notify RN   Protime-INR     Status: None   Collection Time: 07/29/14  7:45 PM  Result Value Ref Range   Prothrombin Time 13.5 11.6 - 15.2 seconds   INR 1.01 0.00 - 1.49  APTT     Status: None   Collection Time: 07/29/14  7:45 PM  Result Value Ref Range   aPTT 36 24 - 37 seconds  CBC     Status: Abnormal   Collection Time: 07/29/14  7:45 PM  Result Value Ref Range   WBC 12.0 (H) 4.0 - 10.5 K/uL   RBC 4.80 4.22 - 5.81 MIL/uL   Hemoglobin 15.8 13.0 - 17.0 g/dL   HCT 16.1 09.6 - 04.5 %   MCV 91.7 78.0 - 100.0 fL   MCH 32.9 26.0 - 34.0 pg   MCHC 35.9 30.0 - 36.0 g/dL   RDW 40.9 81.1 - 91.4 %   Platelets 282 150 - 400 K/uL  Differential     Status: Abnormal   Collection Time: 07/29/14  7:45 PM  Result Value Ref Range   Neutrophils Relative % 62 43 - 77 %   Neutro Abs 7.4 1.7 - 7.7 K/uL   Lymphocytes Relative 28 12 - 46 %   Lymphs Abs 3.3 0.7 - 4.0 K/uL   Monocytes Relative 9 3 - 12 %   Monocytes Absolute 1.1 (H) 0.1 - 1.0 K/uL   Eosinophils Relative 1 0 - 5 %   Eosinophils Absolute 0.2 0.0 - 0.7 K/uL   Basophils Relative  0 0 - 1 %   Basophils Absolute 0.1 0.0 - 0.1 K/uL  Comprehensive metabolic panel     Status: Abnormal   Collection Time: 07/29/14  7:45 PM  Result Value Ref Range   Sodium 131 (L) 135 - 145 mmol/L   Potassium 3.8 3.5 - 5.1 mmol/L   Chloride 97 (L) 101 - 111 mmol/L   CO2 20 (L) 22 - 32 mmol/L   Glucose, Bld 121 (H) 65 - 99 mg/dL   BUN 14 6 - 20 mg/dL   Creatinine, Ser 7.82 (H) 0.61 - 1.24 mg/dL   Calcium 9.7 8.9 - 95.6 mg/dL   Total Protein 8.5 (H) 6.5 - 8.1 g/dL   Albumin 4.1 3.5 - 5.0 g/dL   AST 34 15 - 41 U/L   ALT 36 17 - 63 U/L   Alkaline Phosphatase 65 38 - 126 U/L   Total Bilirubin 0.7 0.3 - 1.2 mg/dL   GFR calc non Af Amer 59 (L) >60 mL/min   GFR calc Af Amer >60 >60 mL/min   Anion gap  14 5 - 15  I-stat troponin, ED (not at [facility])     Status: None   Collection Time: 07/29/14  7:47 PM  Result Value Ref Range   Troponin i, poc 0.00 0.00 - 0.08 ng/mL   Comment 3          I-Stat Chem 8, ED  (not at [facility])     Status: Abnormal   Collection Time: 07/29/14  7:49 PM  Result Value Ref Range   Sodium 135 135 - 145 mmol/L   Potassium 3.8 3.5 - 5.1 mmol/L   Chloride 99 (L) 101 - 111 mmol/L   BUN 16 6 - 20 mg/dL   Creatinine, Ser 7.32 0.61 - 1.24 mg/dL   Glucose, Bld 202 (H) 65 - 99 mg/dL   Calcium, Ion 5.42 7.06 - 1.23 mmol/L   TCO2 19 0 - 100 mmol/L   Hemoglobin 17.0 13.0 - 17.0 g/dL   HCT 23.7 62.8 - 31.5 %  I-Stat CG4 Lactic Acid, ED     Status: Abnormal   Collection Time: 07/29/14  7:51 PM  Result Value Ref Range   Lactic Acid, Venous 2.69 (HH) 0.5 - 2.0 mmol/L   Comment NOTIFIED PHYSICIAN   CBG monitoring, ED     Status: Abnormal   Collection Time: 07/29/14  7:54 PM  Result Value Ref Range   Glucose-Capillary 107 (H) 65 - 99 mg/dL  Lactic acid, plasma     Status: None   Collection Time: 07/30/14  1:06 AM  Result Value Ref Range   Lactic Acid, Venous 2.0 0.5 - 2.0 mmol/L  Glucose, capillary     Status: None   Collection Time: 07/30/14  6:07 AM  Result  Value Ref Range   Glucose-Capillary 99 65 - 99 mg/dL   Ct Head (brain) Wo Contrast  07/29/2014   CLINICAL DATA:  Code stroke. Left-sided weakness and left facial droop. Confusion and diaphoresis. Recent CVA. Initial encounter.  EXAM: CT HEAD WITHOUT CONTRAST  TECHNIQUE: Contiguous axial images were obtained from the base of the skull through the vertex without intravenous contrast.  COMPARISON:  CT/CTA of the head performed 07/26/2014, and MRI of the brain performed 07/25/2014  FINDINGS: There is an evolving infarct at the anterior right frontal operculum, as previously noted, and at the right insular cortex. The infarct at the insular cortex has increased in size since the prior MRI, involving portions of the right external capsule, with vague superior extension into the subcortical white matter of the right parietal lobe. The known punctate infarct at the high right parietal lobe is not well seen on CT. No definite new infarct is characterized. There is no evidence of intra or extra-axial hemorrhage.  Small chronic lacunar infarcts are noted at the left basal ganglia. A right central 3.3 cm arachnoid cyst is again noted at the posterior fossa.  The brainstem and fourth ventricle are within normal limits. The third and lateral ventricles are unremarkable in appearance. No midline shift is seen.  There is no evidence of fracture; visualized osseous structures are unremarkable in appearance. The orbits are within normal limits. The paranasal sinuses and mastoid air cells are well-aerated. No significant soft tissue abnormalities are seen.  IMPRESSION: 1. No definite new infarct seen at this time. 2. Evolving infarct at the anterior right frontal operculum, as previously noted, and at the right insular cortex. The infarct in the insular cortex has increased in size since the prior MRI, now involving portions of the right external capsule,  with vague superior extension into the subcortical white matter of the right  parietal lobe. Known punctate infarct at the high right parietal lobe is not well seen on CT. No evidence of hemorrhagic transformation at this time. 3. Small chronic lacunar infarcts at the left basal ganglia. 4. Right central 3.3 cm arachnoid cyst again seen at the posterior fossa.  These results were called by telephone at the time of interpretation on 07/29/2014 at 8:03 pm to Dr. Leroy Kennedy, who verbally acknowledged these results.   Electronically Signed   By: Roanna Raider M.D.   On: 07/29/2014 20:05   Mr Brain Wo Contrast  07/29/2014   ADDENDUM REPORT: 07/29/2014 23:32  ADDENDUM: Acute findings discussed with and reconfirmed by Dr.OSVALDO CAMILO on 07/29/2014 at 11:30 pm.   Electronically Signed   By: Awilda Metro M.D.   On: 07/29/2014 23:32   07/29/2014   CLINICAL DATA:  LEFT-sided weakness beginning today, slurred speech and facial droop. Similar symptoms to prior stroke, however extremity weakness is new. History of hypertension and hyperlipidemia.  EXAM: MRI HEAD WITHOUT CONTRAST  TECHNIQUE: Multiplanar, multiecho pulse sequences of the brain and surrounding structures were obtained without intravenous contrast.  COMPARISON:  CT head July 29, 2014 and MRI of the brain July 25, 2014  FINDINGS: Reduced diffusion within RIGHT posterior insula, new from prior examination, with mild reduced diffusion within the RIGHT lenticulostriate nucleus. Patchy reduced diffusion in the posterior RIGHT frontal lobe/operculum, new from prior examination. These areas show low ADC values. Reduced diffusion within RIGHT frontal operculum, with normalized ADC values consistent with acute to subacute infarct. No susceptibility artifact to suggest hemorrhage.  Ventricles and sulci are overall normal for patient's age. Scattered subcentimeter supratentorial white matter FLAIR T2 hyperintensities exclusive of the aforementioned on abnormality consistent with chronic small vessel ischemic disease. No midline shift. No significant  mass effect.  Posterior fossa arachnoid cyst again noted. No abnormal extra-axial fluid collections. Loss of RIGHT internal carotid artery flow related enhancement again noted, however, poor RIGHT middle cerebral artery flow related enhancement, which appears new from prior imaging.  Ocular globes and orbital contents are unremarkable. Paranasal sinuses are well aerated. Trace mastoid effusions. Mild temporomandibular osteoarthrosis. No abnormal sellar expansion. No cerebellar tonsillar ectopia. No suspicious calvarial bone marrow signal.  IMPRESSION: Acute moderate RIGHT middle cerebral artery territory infarct (further involvement of the operculum, new involvement of the insula and RIGHT basal ganglia), propagated from prior imaging. No hemorrhagic conversion.  Suspected occluded RIGHT internal carotid artery, with slow flow versus occluded RIGHT middle cerebral artery.  Electronically Signed: By: Awilda Metro M.D. On: 07/29/2014 23:28    Assessment/Plan: Diagnosis: extension of right MCA infarct (embolic from Right ICA) 1. Does the need for close, 24 hr/day medical supervision in concert with the patient's rehab needs make it unreasonable for this patient to be served in a less intensive setting? Yes 2. Co-Morbidities requiring supervision/potential complications: htn, post-stroke sequelae 3. Due to bladder management, bowel management, safety, skin/wound care, disease management, medication administration and patient education, does the patient require 24 hr/day rehab nursing? Yes 4. Does the patient require coordinated care of a physician, rehab nurse, PT (1-2 hrs/day, 5 days/week), OT (1-2 hrs/day, 5 days/week) and SLP (1-2 hrs/day, 5 days/week) to address physical and functional deficits in the context of the above medical diagnosis(es)? Yes Addressing deficits in the following areas: balance, endurance, locomotion, strength, transferring, bowel/bladder control, bathing, dressing, feeding,  grooming, toileting, cognition, speech, language, swallowing and psychosocial support 5. Can  the patient actively participate in an intensive therapy program of at least 3 hrs of therapy per day at least 5 days per week? Yes 6. The potential for patient to make measurable gains while on inpatient rehab is excellent 7. Anticipated functional outcomes upon discharge from inpatient rehab are supervision and min assist  with PT, supervision and min assist with OT, supervision with SLP. 8. Estimated rehab length of stay to reach the above functional goals is: 14-18 days 9. Does the patient have adequate social supports and living environment to accommodate these discharge functional goals? Yes 10. Anticipated D/C setting: Home 11. Anticipated post D/C treatments: HH therapy and Outpatient therapy 12. Overall Rehab/Functional Prognosis: excellent  RECOMMENDATIONS: This patient's condition is appropriate for continued rehabilitative care in the following setting: CIR Patient has agreed to participate in recommended program. Yes Note that insurance prior authorization may be required for reimbursement for recommended care.  Comment: Rehab Admissions Coordinator to follow up.  Thanks,  Ranelle Oyster, MD, [place] [facility]     07/30/2014

## 2014-07-30 NOTE — Progress Notes (Signed)
Received prescreen request for inpatient rehab and noted that PT/OT evaluations are pending. I will follow up after evals are completed to see if inpatient rehab consult is appropriate. Thanks.  Juliann Mule, PT Rehabilitation Admissions Coordinator [phone]

## 2014-07-30 NOTE — Progress Notes (Signed)
Pt arrived to floor room 4N29. Alert and oriented x4. Placed on tele box 4N29. Wife at bedside. Oriented to room and equipment. Call bell within reach. Continue to monitor.

## 2014-07-30 NOTE — Evaluation (Signed)
Speech Language Pathology Evaluation Patient Details Name: [name] MRN: [id number] DOB: 07-30-1955 Today's Date: 07/30/2014 Time: 5621-3086 SLP Time Calculation (min) (ACUTE ONLY): 15 min  Problem List:  Patient Active Problem List   Diagnosis Date Noted  . Tobacco dependence   . Carotid artery occlusion with infarction   . ETOH abuse   . Acute thrombotic stroke 07/25/2014  . CVA (cerebral infarction) 07/25/2014  . Acute ischemic stroke   . Essential hypertension   . Hyperlipidemia   . Tobacco abuse   . Essential hypertension, benign 10/11/2013  . Mixed hyperlipidemia 10/11/2013   Past Medical History:  Past Medical History  Diagnosis Date  . Arthritis   . Hypertension   . Hyperlipidemia    Past Surgical History:  Past Surgical History  Procedure Laterality Date  . Loop recorder implant  07/27/14    LOOP REVEAL LINQ [id number] - [id number]  . Tee without cardioversion N/A 07/27/2014    Procedure: TRANSESOPHAGEAL ECHOCARDIOGRAM (TEE);  Surgeon: Chrystie Nose, MD;  Location: [facility] ENDOSCOPY;  Service: Cardiovascular;  Laterality: N/A;  . Ep implantable device N/A 07/27/2014    Procedure: Loop Recorder Insertion;  Surgeon: Marinus Maw, MD;  Location: [facility] INVASIVE CV LAB;  Service: Cardiovascular;  Laterality: N/A;   HPI:  Pt is a 59 yo male admitted with worsening left facial droop, slurred speech and left sided weakness.  Pmhx significant for hyperlipidemia, CVA last week, HTN and alcohol abuse per chart review as pt stated he drinks 5-18 beers per day and was a current smoker during last admit.   Pt denies dysphagia MR head on 07/25/14 indicated acute non-hemorrhagic infarct of right frontal operculum and anterior portion of insular cortex; Acute punctate infarct in high right parietal lobe near vertex.  Acute moderate RIGHT middle cerebral artery territory infarct  New MRI during this admit showed Acute moderate RIGHT middle cerebral artery territory infarct.  Speech and swallow  evaluation ordered = pt failed RNSSS.    Assessment / Plan / Recommendation Clinical Impression  Pt presents with mild dysarthria with imprecise articulation and decreased phonatory strength, facial/trigeminal/hypoglossal nerve impairments noted however his speech is intelligible.  Mild=moderate cognitive impairments presents with decreased awareness to deficits (most notably to decreased ability to attend left) and ? left inattention.  Pt would benefit from skilled SlP to maximize speech, cognitive linguistic abilities to decrease caregiver burden.  Marginally decreased participation in evaluation noted causing SLP to question if pt will readily participate in tx.  Advised pt/family to findings/recommendations.  Encouraged family to address pt on left side to help attention - using teach back for reinforcement.      SLP Assessment  Patient needs continued Speech Lanaguage Pathology Services    Follow Up Recommendations  Inpatient Rehab    Frequency and Duration min 2x/week  2 weeks   Pertinent Vitals/Pain Pain Assessment: No/denies pain   SLP Goals  Progression toward goals: Progressing toward goals Patient/Family Stated Goal: to get pt better, "they should've educated him better before he left on Friday" per pt mother Potential Considerations (ACUTE ONLY): Cooperation/participation level;Previous level of function  SLP Evaluation Prior Functioning  Type of Home: House  Lives With: Spouse;Other (Comment) Available Help at Discharge: Family Education: 8th grade education 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 behavior component as pt required cues to turn off tv  volume to participate in eval) Selective Attention Impairment: Verbal  basic;Functional basic Awareness: Impaired Awareness Impairment: Intellectual impairment (decr awareness to attended to left deficits) Problem Solving: Impaired (admitted to need to use call bell for assist - needed assist to locate in bed *was on his left) Problem Solving Impairment: Functional basic Behaviors: Impulsive    Comprehension  Auditory Comprehension Overall Auditory Comprehension: Appears within functional limits for tasks assessed Yes/No Questions: Not tested Commands: Within Functional Limits (for basic) Conversation: Complex Interfering Components: Attention (? left inattn vs visual impairment, behavior) Visual Recognition/Discrimination Discrimination: Not tested Reading Comprehension Reading Status: Impaired (? decreased attention to left, pt could read words on right side of page) Word level: Impaired Functional Environmental (signs, name badge): Impaired Interfering Components: Attention;Left neglect/inattention;Visual scanning    Expression Expression Primary Mode of Expression: Verbal Verbal Expression Overall Verbal Expression: Appears within functional limits for tasks assessed Initiation: No impairment Level of Generative/Spontaneous Verbalization: Sentence Repetition:  (dnt) Naming: Not tested Pragmatics:  (? flat affect) Interfering Components: Attention Written Expression Dominant Hand: Right Written Expression: Not tested   Oral / Motor Oral Motor/Sensory Function Overall Oral Motor/Sensory Function: Impaired Labial ROM: Reduced left Labial Symmetry: Abnormal symmetry left Labial Strength: Reduced Labial Sensation: Reduced Lingual ROM: Reduced left Lingual Symmetry: Within Functional Limits Lingual Strength: Reduced Lingual Sensation: Within Functional Limits Facial ROM: Reduced left Facial Symmetry: Left droop;Other (Comment) (minimal) Facial Strength: Reduced Facial Sensation: Reduced Velum: Within Functional Limits Mandible: Within  Functional Limits Motor Speech Overall Motor Speech: Impaired Respiration: Impaired Level of Impairment: Sentence Phonation: Low vocal intensity Resonance: Within functional limits Articulation: Impaired Level of Impairment: Phrase Intelligibility: Intelligible Motor Planning: Witnin functional limits Motor Speech Errors: Not applicable Interfering Components: Premorbid status Effective Techniques: Slow rate;Increased vocal intensity   GO Functional Assessment Tool Used: clinical judgement Functional Limitations: Swallowing Swallow Current Status ([id number]): At least 40 percent but less than 60 percent impaired, limited or restricted Swallow Goal Status [phone]): At least 20 percent but less than 40 percent impaired, limited or restricted   Donavan Burnet, MS [facility] SLP [phone]

## 2014-07-30 NOTE — Evaluation (Signed)
Physical Therapy Evaluation Patient Details Name: [name] MRN: [id number] DOB: 06-23-1955 Today's Date: 07/30/2014   History of Present Illness  59 yo male admitted with L facial droop, slurred, and L sided weakness. MRI (+) acute R MCA ( new R basal ganglia) Recent d/c 07/27/14 from [facility] s/p loop recorder, Tee, CVA workup. PMH: arthritis, HTN  Clinical Impression  Patient demonstrates deficits in functional mobility as indicated below. Will need continued skilled Pt to address deficits and maximize function. Will see as indicated and progress as tolerated. Patient will need CIR upon acute d/c to maximize potential recovery post CVA.    Follow Up Recommendations CIR;Supervision/Assistance - 24 hour    Equipment Recommendations  Other (comment) (tbd)    Recommendations for Other Services Rehab consult     Precautions / Restrictions Precautions Precautions: Fall      Mobility  Bed Mobility Overal bed mobility: Needs Assistance Bed Mobility: Sit to Supine     Supine to sit: Mod assist Sit to supine: Mod assist   General bed mobility comments: VCs for positioning, assist for safety and mobility, patient very impulsive with poor ability to follow comands for return to supine, attempting to lay down sideways towards the left without fully being on the bed  Transfers Overall transfer level: Needs assistance Equipment used: 1 person hand held assist Transfers: Sit to/from Stand Sit to Stand: Max assist         General transfer comment: Patient significantly impulsive, able to power to standing but required max assist for safety and mobility, patient attempting to launch himself out of chair ov 3 occassions with no recognition of positioning re: left side.   Ambulation/Gait Ambulation/Gait assistance: Max assist Ambulation Distance (Feet): 4 Feet Assistive device: 1 person hand held assist       General Gait Details: required full body support with nearly fally, manual  assist to hold upright, manual and verbal cues for initiation of step, Patient extremely impulsive and lacks insight and awarenss XB:[id number] left side  Stairs            Wheelchair Mobility    Modified Rankin (Stroke Patients Only)       Balance Overall balance assessment: Needs assistance Sitting-balance support: Single extremity supported Sitting balance-Leahy Scale: Poor   Postural control: Posterior lean;Left lateral lean Standing balance support: Single extremity supported;During functional activity Standing balance-Leahy Scale: Zero                               Pertinent Vitals/Pain Pain Assessment: No/denies pain    Home Living Family/patient expects to be discharged to:: Private residence Living Arrangements: Spouse/significant other;Other (Comment) Available Help at Discharge: Family Type of Home: House Home Access: Stairs to enter Entrance Stairs-Rails: Doctor, general practice of Steps: 2 Home Layout: One level Home Equipment: None      Prior Function Level of Independence: Independent         Comments: works as Merchandiser, retail at [employer]   Dominant Hand: Right    Extremity/Trunk Assessment   Upper Extremity Assessment: LUE deficits/detail       LUE Deficits / Details: tone noted, muscle belly tapping in supine able to activate tricep x2, no active movement in L hand noted   Lower Extremity Assessment: LLE deficits/detail   LLE Deficits / Details: weakness noted, 3-/5 hip flexion, knee extension, 3/5 dorsiflexion  Cervical / Trunk Assessment: Normal  Communication   Communication: Expressive difficulties  Cognition Arousal/Alertness: Awake/alert Behavior During Therapy: Impulsive Overall Cognitive Status: Impaired/Different from baseline Area of Impairment: Attention;Following commands;Safety/judgement;Awareness;Problem solving Orientation Level: Disoriented to;Time;Situation Current  Attention Level: Sustained Memory: Decreased recall of precautions;Decreased short-term memory Following Commands: Follows one step commands inconsistently;Follows one step commands with increased time Safety/Judgement: Decreased awareness of safety;Decreased awareness of deficits Awareness: Intellectual Problem Solving: Slow processing;Decreased initiation;Difficulty sequencing General Comments: Patient oreiented x3 this session but pt remains with L inattention, impulisve at times, pt demonstrates visual deficits and lack of awareness    General Comments General comments (skin integrity, edema, etc.): Spoke with spouse at length regarding BEFAST signs of stroke as well as expectations for therapy during recovery.     Exercises        Assessment/Plan    PT Assessment Patient needs continued PT services  PT Diagnosis Difficulty walking;Abnormality of gait;Hemiplegia non-dominant side;Altered mental status   PT Problem List Decreased strength;Decreased activity tolerance;Decreased balance;Decreased mobility;Decreased coordination;Decreased cognition;Decreased knowledge of use of DME;Decreased safety awareness;Impaired sensation  PT Treatment Interventions DME instruction;Gait training;Stair training;Functional mobility training;Therapeutic activities;Therapeutic exercise;Balance training;Neuromuscular re-education;Cognitive remediation;Patient/family education   PT Goals (Current goals can be found in the Care Plan section) Acute Rehab PT Goals Patient Stated Goal: to lay down PT Goal Formulation: With patient/family Time For Goal Achievement: 08/13/14 Potential to Achieve Goals: Good    Frequency Min 4X/week   Barriers to discharge        Co-evaluation               End of Session Equipment Utilized During Treatment: Gait belt Activity Tolerance: Patient tolerated treatment well Patient left: in bed;with call bell/phone within reach;with bed alarm set;with family/visitor  present Nurse Communication: Mobility status         Time: 1310-1337 PT Time Calculation (min) (ACUTE ONLY): 27 min   Charges:   PT Evaluation $Initial PT Evaluation Tier I: 1 Procedure PT Treatments $Self Care/Home Management: 8-22   PT G CodesFabio Asa August 06, 2014, 1:50 PM  Charlotte Crumb, PT DPT  [phone]

## 2014-07-30 NOTE — Progress Notes (Signed)
OT Cancellation Note  Patient Details Name: [name] MRN: [id number] DOB: Sep 01, 1955   Cancelled Treatment:    Reason Eval/Treat Not Completed: Medical issues which prohibited therapy (BEDREST) OT to await incr activity orders prior to evaluation  Harolyn Rutherford  Pager: [phone]  07/30/2014, 7:26 AM

## 2014-07-31 ENCOUNTER — Ambulatory Visit: Payer: BLUE CROSS/BLUE SHIELD | Admitting: Physician Assistant

## 2014-07-31 DIAGNOSIS — I639 Cerebral infarction, unspecified: Secondary | ICD-10-CM

## 2014-07-31 LAB — CBC
HEMATOCRIT: 42.8 % (ref 39.0–52.0)
Hemoglobin: 14.7 g/dL (ref 13.0–17.0)
MCH: 31.9 pg (ref 26.0–34.0)
MCHC: 34.3 g/dL (ref 30.0–36.0)
MCV: 92.8 fL (ref 78.0–100.0)
Platelets: 282 10*3/uL (ref 150–400)
RBC: 4.61 MIL/uL (ref 4.22–5.81)
RDW: 12.6 % (ref 11.5–15.5)
WBC: 9.5 10*3/uL (ref 4.0–10.5)

## 2014-07-31 LAB — BASIC METABOLIC PANEL
ANION GAP: 10 (ref 5–15)
BUN: 12 mg/dL (ref 6–20)
CALCIUM: 9.2 mg/dL (ref 8.9–10.3)
CO2: 23 mmol/L (ref 22–32)
CREATININE: 0.78 mg/dL (ref 0.61–1.24)
Chloride: 102 mmol/L (ref 101–111)
GFR calc non Af Amer: >60 mL/min (ref >60)
GLUCOSE: 94 mg/dL (ref 65–99)
Potassium: 4 mmol/L (ref 3.5–5.1)
Sodium: 135 mmol/L (ref 135–145)

## 2014-07-31 LAB — GLUCOSE, CAPILLARY
Glucose-Capillary: 103 mg/dL — ABNORMAL HIGH (ref 65–99)
Glucose-Capillary: 146 mg/dL — ABNORMAL HIGH (ref 65–99)

## 2014-07-31 LAB — TSH: TSH: 3.313 u[IU]/mL (ref 0.350–4.500)

## 2014-07-31 MED ORDER — ATORVASTATIN CALCIUM 80 MG PO TABS
80.0000 mg | ORAL_TABLET | Freq: Every day | ORAL | Status: DC
  Administered 2014-07-31: 80 mg via ORAL
  Filled 2014-07-31: qty 1

## 2014-07-31 MED ORDER — ACETAMINOPHEN 325 MG PO TABS
650.0000 mg | ORAL_TABLET | Freq: Four times a day (QID) | ORAL | Status: DC | PRN
  Administered 2014-07-31 – 2014-08-01 (×3): 650 mg via ORAL
  Filled 2014-07-31 (×3): qty 2

## 2014-07-31 NOTE — Progress Notes (Signed)
Physical Therapy Treatment Patient Details Name: [name] MRN: [id number] DOB: 11/21/1955 Today's Date: 07/31/2014    History of Present Illness 59 yo male admitted with L facial droop, slurred, and L sided weakness. MRI (+) acute R MCA ( new R basal ganglia) Recent d/c 07/27/14 from [facility] s/p loop recorder, Tee, CVA workup. PMH: arthritis, HTN    PT Comments    Patient demonstrates improvements in LLE strength and mobility. Less assist required this session, able to tolerate activity with improvement in impulsivity and awareness.  Performed LLE there-ex including LAQs, Ankle ROM and push-backs against resistance. Patient also able to tolerate some gait training activities with assist. Will continue to see and progress as tolerated. Highly recommend CIR upon acute discharge to maximize recovery with hopes to regain overall function.    Follow Up Recommendations  CIR;Supervision/Assistance - 24 hour     Equipment Recommendations  Other (comment) (tbd)    Recommendations for Other Services Rehab consult     Precautions / Restrictions Precautions Precautions: Fall Restrictions Weight Bearing Restrictions: No    Mobility  Bed Mobility Overal bed mobility: Needs Assistance Bed Mobility: Rolling;Sidelying to Sit Rolling: Min assist Sidelying to sit: Mod assist       General bed mobility comments: VCs for positioning with rolling and sidelying to sit technique, assist to initiate and power to upright  Transfers Overall transfer level: Needs assistance Equipment used: Rolling walker (2 wheeled) Transfers: Sit to/from Stand Sit to Stand: Mod assist         General transfer comment: Assist to manual attend to left hand on RW. VCs for safety with elevation to standing and controlling descent to chair  Ambulation/Gait Ambulation/Gait assistance: Mod assist Ambulation Distance (Feet): 24 Feet Assistive device: Rolling walker (2 wheeled)       General Gait Details:  deficits in proprioception noted with stride on LLE. VCs for positioning and step control. Manual assist for weight shift to the right to advance left. Assist for stability   Stairs            Wheelchair Mobility    Modified Rankin (Stroke Patients Only)       Balance     Sitting balance-Leahy Scale: Fair       Standing balance-Leahy Scale: Poor                      Cognition Arousal/Alertness: Awake/alert Behavior During Therapy: Flat affect;Impulsive Overall Cognitive Status: Impaired/Different from baseline Area of Impairment: Attention;Following commands;Safety/judgement;Awareness;Problem solving   Current Attention Level: Sustained Memory: Decreased recall of precautions;Decreased short-term memory Following Commands: Follows one step commands inconsistently;Follows one step commands with increased time Safety/Judgement: Decreased awareness of safety;Decreased awareness of deficits Awareness: Intellectual Problem Solving: Slow processing;Decreased initiation;Difficulty sequencing General Comments: improvements in impulsivity and awareness noted this session compared to evaluation    Exercises      General Comments        Pertinent Vitals/Pain Pain Assessment: 0-10 Pain Score: 4  Pain Location: headache Pain Descriptors / Indicators: Headache Pain Intervention(s): Monitored during session;Repositioned    Home Living                      Prior Function            PT Goals (current goals can now be found in the care plan section) Acute Rehab PT Goals Patient Stated Goal: to lay down PT Goal Formulation: With patient/family Time For Goal Achievement: 08/13/14 Potential  to Achieve Goals: Good Progress towards PT goals: Progressing toward goals    Frequency  Min 3X/week    PT Plan Current plan remains appropriate    Co-evaluation             End of Session Equipment Utilized During Treatment: Gait belt Activity  Tolerance: Patient tolerated treatment well Patient left: in chair;with call bell/phone within reach;with chair alarm set     Time: 4098-1191 PT Time Calculation (min) (ACUTE ONLY): 20 min  Charges:  $Therapeutic Activity: 8-22 mins                    G CodesFabio Asa 2014-08-07, 11:03 AM Charlotte Crumb, PT DPT  [phone]

## 2014-07-31 NOTE — Progress Notes (Addendum)
Inpatient Rehabilitation   I met with the patient and his mother [place] at the bedside to discuss his post acute rehab needs and options.  Pt. Would like to pursue getting approval for IP rehab.  I will initiate this process.  Pt. And his mom state that pt. will have 24 hour care between [place] and pt's son and daughter. I have placed a call to pt's wife Tywon Niday to obtain phone numbers for son and daughter to confirm their level of involvement in their dad's care.  I have notified pt and his mom that admission to [facility] will be dependent on insurance authorization, bed availability and if 24 hour family support is available.  Per pt., his wife has a rare brain disorder and she is not able to be his primary care provider.  I have notified Dysheka Bibbs SW and [employer]. CM of the above.    I will update the team as I obtain further information or receive insurance decision.  Please call if questions.  [place] Admissions Coordinator Cell [phone] Office [phone]   Addendum [phone])  I met with pt's daughter Loma Sousa and she confirms her availability and intention to assist in her dad's care.  Continue to await insurance decision.  Will f/u tomorrow.   Gerlean Ren

## 2014-07-31 NOTE — Progress Notes (Signed)
Family Medicine Teaching Service Daily Progress Note Intern Pager: [phone]  Patient name: [name] Medical record number: [id number] Date of birth: 01-Jun-1955 Age: 59 y.o. Gender: male  Primary Care Provider: Dow Adolph, MD Consultants: Neurology, CIR Code Status: Full  Pt Overview and Major Events to Date:  6/12: Trouble with mobility, quiet, L facial droop, slurred speech, L arm paresis  Assessment and Plan: [name] is a 59 y.o. male presenting with concern for acute stroke. PMH is significant for CVA, HTN, HLD, Tobacco abuse, EtOH abuse  Left-sided hemiparesis: this is a new finding from recent stroke (R MCA infract). Extension of previous stroke. Patient had a loop recorder implanted during last hospitalization however no events. Found to have 100% occlusion of right internal carotid on CTA previous admission. TEE on previous admission without evidence of atrial thrombus or vegetations. Because of multiple areas of infarction, concern for embolism likely from ICA. Hemoglobin A1c of 6. LDL of 97. MRI revealed propagation of the R MCA infract into the operculum with new involvement of the insula and RIGHT basal ganglia. - continue to monitor on telemetry  - frequent neuro checks - Neurology following, appreciate recommendations - SLP evaluation: dysphagia 3 diet - continue statin, Plavix/ASA - PT/OT: CIR - will await PM&R evaluation  Hypertension: normotensive, slightly hypertensive (151/93 highest)   - allow for permissive hypertension - hold lisinopril-hctz, would consider holding this on discharge if BPs stay stable given concerns for hypotension with re-initiation of these on previous discharge. - ok to restart per neuro - follow blood pressures  Tobacco abuse: patient quit smoking since previous admission - nicotine patch  daily  Elevated lactic acid: Resolved. initially elevated to 2.69 on admission. Patient currently stable. Normotensive. Possibly related to  stroke. Improved to 2.0.  FEN/GI: Dysphagia 3, KVO Pophylaxis: heparin subq  Disposition: Most likely CIR pending evaluation, otherwise SNF  Subjective:  Patient without pain. Movement in LUE and LLE improving,.  Looking forward to possibly going to CIR.  Objective: Temp:  [97.9 F (36.6 C)-98.6 F (37 C)] 98.2 F (36.8 C) (06/14 0554) Pulse Rate:  [51-78] 64 (06/14 0554) Resp:  [18-20] 20 (06/14 0554) BP: (120-151)/(62-93) 151/93 mmHg (06/14 0554) SpO2:  [66 %-100 %] 66 % (06/14 0554) Physical Exam: General: Lying in bed.  Cardiovascular: RRR, no m/r/g noted. No pitting edema Respiratory: Normal WOB. CTAB. No w/r/c. Abdomen: +BS, soft, ND/NT Extremities: No gross deformities  Neuro: A&O x 4. EOMI. PERRL. L sided facial droop most noteable in the lips with smile, not promienet in the forehead. Dysarthria improved.  Decreased sensation to touch in the V1-V3 distribution on the L. 3/5 strength at elbow of L arm, otherwise unable to move. 5/5 strength in the R UE. 4/5 strength in the L LE, 5/5 on the R. Decreased sensation on the L UE, as well as decreased sensation over the L LE.  Laboratory:  Recent Labs Lab 07/25/14 1531  07/29/14 1945 07/29/14 1949 07/31/14 0545  WBC 12.7*  --  12.0*  --  9.5  HGB 14.8  < > 15.8 17.0 14.7  HCT 42.4  < > 44.0 50.0 42.8  PLT 283  --  282  --  282  < > = values in this interval not displayed.  Recent Labs Lab 07/25/14 1531  07/29/14 1945 07/29/14 1949 07/31/14 0545  NA 133*  < > 131* 135 135  K 3.9  < > 3.8 3.8 4.0  CL 101  < > 97* 99* 102  CO2  24  --  20*  --  23  BUN 19  < > 14 16 12   CREATININE 0.95  < > 1.30* 1.20 0.78  CALCIUM 9.7  --  9.7  --  9.2  PROT 7.4  --  8.5*  --   --   BILITOT 0.6  --  0.7  --   --   ALKPHOS 62  --  65  --   --   ALT 33  --  36  --   --   AST 26  --  34  --   --   GLUCOSE 87  < > 121* 123* 94  < > = values in this interval not displayed. Risk Stratification Labs  TSH    Component Value  Date/Time   TSH 3.239 10/10/2012 1012   Hemoglobin A1C    Component Value Date/Time   HGBA1C 6.0* 07/26/2014 0451   Lipid Panel     Component Value Date/Time   CHOL 192 07/26/2014 0437   TRIG 305* 07/26/2014 0437   HDL 34* 07/26/2014 0437   CHOLHDL 5.6 07/26/2014 0437   VLDL 61* 07/26/2014 0437   LDLCALC 97 07/26/2014 0437      Imaging/Diagnostic Tests: CT HEAD WITHOUT CONTRAST 07/29/2014  1. No definite new infarct seen at this time. 2. Evolving infarct at the anterior right frontal operculum, as previously noted, and at the right insular cortex. The infarct in the insular cortex has increased in size since the prior MRI, now involving portions of the right external capsule, with vague superior extension into the subcortical white matter of the right parietal lobe. Known punctate infarct at the high right parietal lobe is not well seen on CT. No evidence of hemorrhagic transformation at this time. 3. Small chronic lacunar infarcts at the left basal ganglia. 4. Right central 3.3 cm arachnoid cyst again seen at the posterior fossa  MRI: Acute moderate RIGHT middle cerebral artery territory infarct (further involvement of the operculum, new involvement of the insula and RIGHT basal ganglia), propagated from prior imaging. No hemorrhagic conversion.Suspected occluded RIGHT internal carotid artery, with slow flow versus occluded RIGHT middle cerebral artery.   Erasmo Downer, MD 07/31/2014, 9:21 AM PGY-1, [facility] FPTS Intern pager: [phone], text pages welcome

## 2014-07-31 NOTE — Discharge Summary (Signed)
Family Medicine Teaching [facility] Discharge Summary  Patient name: [name] Medical record number: [id number] Date of birth: 1955/07/02 Age: 59 y.o. Gender: male Date of Admission: 07/29/2014  Date of Discharge: 08/01/2014  Admitting Physician: Carney Living, MD  Primary Care Provider: Dow Adolph, MD Consultants: Neurology, CIR  Indication for Hospitalization: worsening of L facial droop, dysarthria and L arm paresis  Discharge Diagnoses/Problem List:  Embolic stroke with extension L sided hemiparesis HTN Tobacco abuse Elevated lactic acid - resolved  Disposition: CIR  Discharge Condition: improved  Discharge Exam: See progress note from day of discharge  Brief Hospital Course:  [name] is a 59 y.o. male presenting with concern for acute stroke. PMH is significant for CVA, HTN, HLD, Tobacco abuse, EtOH abuse.  Left-sided hemiparesis: Patient presenting with worsening of L facial droop and dysarthria and new-onset L hemiparesis after recent embolic R MCA stroke.  Loop recorder (implanted during last hospitalization) showed no a-fib. MRI on admission revealed propagation of the R MCA infract into the operculum with new involvement of the insula and RIGHT basal ganglia.  Patient monitored on telemetry. Neurology consulted, and provided recommendations throughout admission.  Continued on statin, plavix and aspirin from last admission.  SLP evaluated patient and recommended dysphagia 3 diet. PT/OT evaluated and recommended CIR placement.  Statin dose was increased to atorvastatin  daily.  Patient saw improvement in L sided hemiparesis during hospitalization.  Hypertension: BP normotensive to slightly hypertensive throughout hospitalization.  Allowed permissive hypertension for first 24 hours.  Home lisinopril/HCTZ held on admission.  Given concerns for hypotension with re-initiation of antihypertensives at last discharge, antihypertensives were discontinued.     Tobacco abuse: patient quit smoking since previous admission. Provided with nicotine patch  daily.  Elevated lactic acid:  initially elevated to 2.69 on admission. Possibly related to stroke. Improved to 2.0.  All other chronic medical conditions stable throughout admission and managed with home regimens.  Issues for Follow Up:  - continued PT/OT treatment - f/u BP - goal systolic 130 to 150 per neurology - f/u tobacco cessation and taper nictoine patch  Significant Procedures: none  Significant Labs and Imaging:   Recent Labs Lab 07/29/14 1945 07/29/14 1949 07/31/14 0545 08/01/14 0416  WBC 12.0*  --  9.5 10.3  HGB 15.8 17.0 14.7 14.8  HCT 44.0 50.0 42.8 42.2  PLT 282  --  282 269    Recent Labs Lab 07/25/14 1531 07/25/14 1537 07/29/14 1945 07/29/14 1949 07/31/14 0545 08/01/14 0416  NA 133* 134* 131* 135 135 135  K 3.9 4.0 3.8 3.8 4.0 4.3  CL 101 102 97* 99* 102 104  CO2 24  --  20*  --  23 21*  GLUCOSE 87 89 121* 123* 94 81  BUN 19 22* CREATININE 0.95 0.90 1.30* 1.20 0.78 0.87  CALCIUM 9.7  --  9.7  --  9.2 9.4  ALKPHOS 62  --  65  --   --   --   AST 26  --  34  --   --   --   ALT 33  --  36  --   --   --   ALBUMIN 3.9  --  4.1  --   --   --     Hemoglobin A1C  Labs (Brief)       Component Value Date/Time   HGBA1C 6.0* 07/26/2014 0451     Lipid Panel   Labs (Brief)  Component Value Date/Time   CHOL 192 07/26/2014 0437   TRIG 305* 07/26/2014 0437   HDL 34* 07/26/2014 0437   CHOLHDL 5.6 07/26/2014 0437   VLDL 61* 07/26/2014 0437   LDLCALC 97 07/26/2014 0437           CT HEAD WITHOUT CONTRAST 07/29/2014 1. No definite new infarct seen at this time. 2. Evolving infarct at the anterior right frontal operculum, as previously noted, and at the right insular cortex. The infarct in the insular cortex has increased in size since the prior MRI, now involving portions of the right external  capsule, with vague superior extension into the subcortical white matter of the right parietal lobe. Known punctate infarct at the high right parietal lobe is not well seen on CT. No evidence of hemorrhagic transformation at this time. 3. Small chronic lacunar infarcts at the left basal ganglia. 4. Right central 3.3 cm arachnoid cyst again seen at the posterior fossa  MRI: Acute moderate RIGHT middle cerebral artery territory infarct (further involvement of the operculum, new involvement of the insula and RIGHT basal ganglia), propagated from prior imaging. No hemorrhagic conversion.Suspected occluded RIGHT internal carotid artery, with slow flow versus occluded RIGHT middle cerebral artery.  Results/Tests Pending at Time of Discharge: none  Discharge Medications:    Medication List    STOP taking these medications        lisinopril-hydrochlorothiazide 20-12.5 MG per tablet  Commonly known as:  PRINZIDE,ZESTORETIC      TAKE these medications        aspirin 81 MG EC tablet  Take 1 tablet (81 mg total) by mouth daily.     atorvastatin 80 MG tablet  Commonly known as:  LIPITOR  Take 1 tablet (80 mg total) by mouth daily at 6 PM.     clopidogrel 75 MG tablet  Commonly known as:  PLAVIX  Take 1 tablet (75 mg total) by mouth daily.     ibuprofen 200 MG tablet  Commonly known as:  ADVIL,MOTRIN  Take 400 mg by mouth every 6 (six) hours as needed (pain).     nicotine 14 mg/24hr patch  Commonly known as:  NICODERM CQ - dosed in mg/24 hours  Place 1 patch (14 mg total) onto the skin daily.        Discharge Instructions: Please refer to Patient Instructions section of EMR for full details.  Patient was counseled important signs and symptoms that should prompt return to medical care, changes in medications, dietary instructions, activity restrictions, and follow up appointments.   Follow-Up Appointments:  To be scheduled after d/c from CIR   Erasmo Downer, MD 08/01/2014, 9:51  AM PGY-1, [facility]

## 2014-07-31 NOTE — H&P (Signed)
Physical Medicine and Rehabilitation Admission H&P    Chief Complaint  Patient presents with  . Code Stroke  : HPI: [name] is a 59 y.o. right handed male with history of hypertension as well as hyperlipidemia with right MCA territory infarct on 07/25/2014 with single right M2 branch occlusion identified just beyond the bifurcation and occluded right ICA origin. Underwent loop recorder placement per Dr. Lovena Le and discharge to home on aspirin as well as Plavix 07/27/2014 independent without assistive device. Patient was independent prior to initial admission 07/25/2014 working full time living with his wife. Presented 07/29/2014 with increasing left-sided weakness and slurred speech. MRI of the brain showed acute moderate right middle cerebral artery territory infarct further involvement of the operculum, new involvement of the insula and right basal ganglia propagated from prior imaging. Recent echocardiogram with ejection fraction of 81% grade 1 diastolic dysfunction. Recent venous Doppler studies negative for DVT. Patient did not receive TPA. Remains on aspirin and Plavix therapy per neurology services. Subcutaneous heparin added for DVT prophylaxis. Tolerating a mechanical soft diet. Physical and occupational therapy evaluations completed 07/30/2014 with recommendations of physical medicine rehabilitation consult. Patient was admitted for a comprehensive rehabilitation program  ROS Review of Systems  Constitutional: Negative for fever and chills.  HENT: Negative for hearing loss.  Eyes: Negative for blurred vision and double vision.  Respiratory: Negative for cough and shortness of breath.  Cardiovascular: Negative for leg swelling.  Gastrointestinal: Positive for heartburn, nausea and constipation.  Musculoskeletal: Positive for myalgias.  Skin: Negative for rash.  Neurological: Positive for dizziness and headaches. Negative for tingling and weakness   Past Medical History    Diagnosis Date  . Arthritis   . Hypertension   . Hyperlipidemia    Past Surgical History  Procedure Laterality Date  . Loop recorder implant  07/27/14    LOOP REVEAL LINQ [id number] - [id number]  . Tee without cardioversion N/A 07/27/2014    Procedure: TRANSESOPHAGEAL ECHOCARDIOGRAM (TEE);  Surgeon: Pixie Casino, MD;  Location: [place];  Service: Cardiovascular;  Laterality: N/A;  . Ep implantable device N/A 07/27/2014    Procedure: Loop Recorder Insertion;  Surgeon: Evans Lance, MD;  Location: [facility] [place];  Service: Cardiovascular;  Laterality: N/A;   Family History  Problem Relation Age of Onset  . Cancer Father   . Heart attack Brother    Social History:  reports that he has been smoking Cigarettes.  He has a 20 pack-year smoking history. He does not have any smokeless tobacco history on file. He reports that he drinks about 10.8 oz of alcohol per week. He reports that he does not use illicit drugs. Allergies: No Known Allergies Medications Prior to Admission  Medication Sig Dispense Refill  . aspirin EC 81 MG EC tablet Take 1 tablet (81 mg total) by mouth daily. 30 tablet 0  . atorvastatin (LIPITOR) 40 MG tablet Take 1 tablet (40 mg total) by mouth daily. 30 tablet 5  . clopidogrel (PLAVIX) 75 MG tablet Take 1 tablet (75 mg total) by mouth daily. 30 tablet 0  . ibuprofen (ADVIL,MOTRIN) 200 MG tablet Take 400 mg by mouth every 6 (six) hours as needed (pain).     Marland Kitchen lisinopril-hydrochlorothiazide (PRINZIDE,ZESTORETIC) 20-12.5 MG per tablet Take 1 tablet by mouth daily. 30 tablet 5    Home: Home Living Family/patient expects to be discharged to:: Private residence Living Arrangements: Spouse/significant other, Other (Comment) Available Help at Discharge: Family Type of Home: House Home Access:  Stairs to enter [employer] of Steps: 2 Entrance Stairs-Rails: Right, Left Home Layout: One level Home Equipment: None  Lives With: Spouse, Other (Comment)    Functional History: Prior Function Level of Independence: Independent Comments: works as Aeronautical engineer at [employer] Status:  Mobility: Bed Mobility Overal bed mobility: Needs Assistance Bed Mobility: Sit to Supine Supine to sit: Mod assist Sit to supine: Mod assist General bed mobility comments: VCs for positioning, assist for safety and mobility, patient very impulsive with poor ability to follow comands for return to supine, attempting to lay down sideways towards the left without fully being on the bed Transfers Overall transfer level: Needs assistance Equipment used: 1 person hand held assist Transfers: Sit to/from Stand Sit to Stand: Max assist General transfer comment: Patient significantly impulsive, able to power to standing but required max assist for safety and mobility, patient attempting to launch himself out of chair ov 3 occassions with no recognition of positioning re: left side.  Ambulation/Gait Ambulation/Gait assistance: Max assist Ambulation Distance (Feet): 4 Feet Assistive device: 1 person hand held assist General Gait Details: required full body support with nearly fally, manual assist to hold upright, manual and verbal cues for initiation of step, Patient extremely impulsive and lacks insight and awarenss PP:[id number] left side    ADL: ADL Overall ADL's : Needs assistance/impaired Upper Body Dressing : Maximal assistance, Bed level Upper Body Dressing Details (indicate cue type and reason): pt unaware L arm dressed and attempting to pull at gown again to dress it.  Lower Body Dressing: Maximal assistance General ADL Comments: Pt will require (A) for all adls due to balance deficits, impulsive and L inattention. Pt required additional cues to scan tray to locate phone. Pt required cues to turn head to read clock. Pt only reading the R side of phone screen. Pt unaware of neglect of L side even with cues. Pt repeating statement with no awareness to  error. pt wet on L side and gown changed. Pt unable to feel wet clothing.   Cognition: Cognition Overall Cognitive Status: Impaired/Different from baseline Arousal/Alertness: Awake/alert Orientation Level: Oriented X4 Attention: Sustained, Selective Sustained Attention: Appears intact Selective Attention: Impaired (? behavior component as pt required cues to turn off tv volume to participate in eval) Selective Attention Impairment: Verbal basic, Functional basic Awareness: Impaired Awareness Impairment: Intellectual impairment (decr awareness to attended to left deficits) Problem Solving: Impaired (admitted to need to use call bell for assist - needed assist to locate in bed *was on his left) Problem Solving Impairment: Functional basic Behaviors: Impulsive Cognition Arousal/Alertness: Awake/alert Behavior During Therapy: Impulsive Overall Cognitive Status: Impaired/Different from baseline Area of Impairment: Attention, Following commands, Safety/judgement, Awareness, Problem solving Orientation Level: Disoriented to, Time, Situation Current Attention Level: Sustained Memory: Decreased recall of precautions, Decreased short-term memory Following Commands: Follows one step commands inconsistently, Follows one step commands with increased time Safety/Judgement: Decreased awareness of safety, Decreased awareness of deficits Awareness: Intellectual Problem Solving: Slow processing, Decreased initiation, Difficulty sequencing General Comments: Patient oreiented x3 this session but pt remains with L inattention, impulisve at times, pt demonstrates visual deficits and lack of awareness  Physical Exam: Blood pressure 127/74, pulse 59, temperature 98.1 F (36.7 C), temperature source Oral, resp. rate 20, height 5' 6" (1.676 m), weight 76.658 kg (169 lb), SpO2 100 %. Physical Exam Constitutional: He appears well-developed. More alert HENT:  Dentition fair, mucosa pink and moist Eyes:   Pupils reactive to light  Neck: Normal range of motion.  Neck supple. No thyromegaly present.  Cardiovascular: Normal rate and regular rhythm. no murmurs or rubs Respiratory: Effort normal and breath sounds normal. No respiratory distress. No wheezes GI: Soft. Bowel sounds are normal. He exhibits no distension.  Musculoskeletal: He exhibits no edema or tenderness.  Neurological:  More arousable. Right gaze preference.  New he had a stroke. Fair awareness of deficits. LUE 2 deltoid, 2- bicep, 2- tricep, trace wrist, 0 fingers. LLE: 2+ to 3/5 HF, KE and ADF/APF. Decreased PP and LT left arm and leg is persistent. Left central 7 and tongue deviation. No resting tone. DTR's 3+ on left. Toes up. Skin: Skin is warm and dry.  Psychiatric:  flat    Results for orders placed or performed during the hospital encounter of 07/29/14 (from the past 48 hour(s))  CBG monitoring, ED     Status: Abnormal   Collection Time: 07/29/14  7:42 PM  Result Value Ref Range   Glucose-Capillary 126 (H) 65 - 99 mg/dL   Comment 1 Notify RN   Protime-INR     Status: None   Collection Time: 07/29/14  7:45 PM  Result Value Ref Range   Prothrombin Time 13.5 11.6 - 15.2 seconds   INR 1.01 0.00 - 1.49  APTT     Status: None   Collection Time: 07/29/14  7:45 PM  Result Value Ref Range   aPTT 36 24 - 37 seconds  CBC     Status: Abnormal   Collection Time: 07/29/14  7:45 PM  Result Value Ref Range   WBC 12.0 (H) 4.0 - 10.5 K/uL   RBC 4.80 4.22 - 5.81 MIL/uL   Hemoglobin 15.8 13.0 - 17.0 g/dL   HCT 44.0 39.0 - 52.0 %   MCV 91.7 78.0 - 100.0 fL   MCH 32.9 26.0 - 34.0 pg   MCHC 35.9 30.0 - 36.0 g/dL   RDW 12.3 11.5 - 15.5 %   Platelets 282 150 - 400 K/uL  Differential     Status: Abnormal   Collection Time: 07/29/14  7:45 PM  Result Value Ref Range   Neutrophils Relative % 62 43 - 77 %   Neutro Abs 7.4 1.7 - 7.7 K/uL   Lymphocytes Relative 28 12 - 46 %   Lymphs Abs 3.3 0.7 - 4.0 K/uL   Monocytes Relative 9 3 -  12 %   Monocytes Absolute 1.1 (H) 0.1 - 1.0 K/uL   Eosinophils Relative 1 0 - 5 %   Eosinophils Absolute 0.2 0.0 - 0.7 K/uL   Basophils Relative 0 0 - 1 %   Basophils Absolute 0.1 0.0 - 0.1 K/uL  Comprehensive metabolic panel     Status: Abnormal   Collection Time: 07/29/14  7:45 PM  Result Value Ref Range   Sodium 131 (L) 135 - 145 mmol/L   Potassium 3.8 3.5 - 5.1 mmol/L   Chloride 97 (L) 101 - 111 mmol/L   CO2 20 (L) 22 - 32 mmol/L   Glucose, Bld 121 (H) 65 - 99 mg/dL   BUN 14 6 - 20 mg/dL   Creatinine, Ser 1.30 (H) 0.61 - 1.24 mg/dL   Calcium 9.7 8.9 - 10.3 mg/dL   Total Protein 8.5 (H) 6.5 - 8.1 g/dL   Albumin 4.1 3.5 - 5.0 g/dL   AST 34 15 - 41 U/L   ALT 36 17 - 63 U/L   Alkaline Phosphatase 65 38 - 126 U/L   Total Bilirubin 0.7 0.3 - 1.2 mg/dL   GFR  calc non Af Amer 59 (L) >60 mL/min   GFR calc Af Amer >60 >60 mL/min    Comment: (NOTE) The eGFR has been calculated using the CKD EPI equation. This calculation has not been validated in all clinical situations. eGFR's persistently <60 mL/min signify possible Chronic Kidney Disease.    Anion gap 14 5 - 15  I-stat troponin, ED (not at [facility], [facility])     Status: None   Collection Time: 07/29/14  7:47 PM  Result Value Ref Range   Troponin i, poc 0.00 0.00 - 0.08 ng/mL   Comment 3            Comment: Due to the release kinetics of cTnI, a negative result within the first hours of the onset of symptoms does not rule out myocardial infarction with certainty. If myocardial infarction is still suspected, repeat the test at appropriate intervals.   I-Stat Chem 8, ED  (not at [facility], [facility])     Status: Abnormal   Collection Time: 07/29/14  7:49 PM  Result Value Ref Range   Sodium 135 135 - 145 mmol/L   Potassium 3.8 3.5 - 5.1 mmol/L   Chloride 99 (L) 101 - 111 mmol/L   BUN 16 6 - 20 mg/dL   Creatinine, Ser 1.20 0.61 - 1.24 mg/dL   Glucose, Bld 123 (H) 65 - 99 mg/dL   Calcium, Ion 1.15 1.12 - 1.23 mmol/L   TCO2 19 0 - 100 mmol/L    Hemoglobin 17.0 13.0 - 17.0 g/dL   HCT 50.0 39.0 - 52.0 %  I-Stat CG4 Lactic Acid, ED     Status: Abnormal   Collection Time: 07/29/14  7:51 PM  Result Value Ref Range   Lactic Acid, Venous 2.69 (HH) 0.5 - 2.0 mmol/L   Comment NOTIFIED PHYSICIAN   CBG monitoring, ED     Status: Abnormal   Collection Time: 07/29/14  7:54 PM  Result Value Ref Range   Glucose-Capillary 107 (H) 65 - 99 mg/dL  Lactic acid, plasma     Status: None   Collection Time: 07/30/14  1:06 AM  Result Value Ref Range   Lactic Acid, Venous 2.0 0.5 - 2.0 mmol/L  Glucose, capillary     Status: None   Collection Time: 07/30/14  6:07 AM  Result Value Ref Range   Glucose-Capillary 99 65 - 99 mg/dL  Glucose, capillary     Status: None   Collection Time: 07/30/14 10:42 PM  Result Value Ref Range   Glucose-Capillary 96 65 - 99 mg/dL   Comment 1 Notify RN    Comment 2 Document in Chart   Basic metabolic panel     Status: None   Collection Time: 07/31/14  5:45 AM  Result Value Ref Range   Sodium 135 135 - 145 mmol/L   Potassium 4.0 3.5 - 5.1 mmol/L   Chloride 102 101 - 111 mmol/L   CO2 23 22 - 32 mmol/L   Glucose, Bld 94 65 - 99 mg/dL   BUN 12 6 - 20 mg/dL   Creatinine, Ser 0.78 0.61 - 1.24 mg/dL   Calcium 9.2 8.9 - 10.3 mg/dL   GFR calc non Af Amer >60 >60 mL/min   GFR calc Af Amer >60 >60 mL/min    Comment: (NOTE) The eGFR has been calculated using the CKD EPI equation. This calculation has not been validated in all clinical situations. eGFR's persistently <60 mL/min signify possible Chronic Kidney Disease.    Anion gap 10 5 - 15  CBC     Status: None   Collection Time: 07/31/14  5:45 AM  Result Value Ref Range   WBC 9.5 4.0 - 10.5 K/uL   RBC 4.61 4.22 - 5.81 MIL/uL   Hemoglobin 14.7 13.0 - 17.0 g/dL   HCT 42.8 39.0 - 52.0 %   MCV 92.8 78.0 - 100.0 fL   MCH 31.9 26.0 - 34.0 pg   MCHC 34.3 30.0 - 36.0 g/dL   RDW 12.6 11.5 - 15.5 %   Platelets 282 150 - 400 K/uL  Glucose, capillary     Status:  Abnormal   Collection Time: 07/31/14  6:36 AM  Result Value Ref Range   Glucose-Capillary 103 (H) 65 - 99 mg/dL   Comment 1 Notify RN    Comment 2 Document in Chart    Ct Head (brain) Wo Contrast  07/29/2014   CLINICAL DATA:  Code stroke. Left-sided weakness and left facial droop. Confusion and diaphoresis. Recent CVA. Initial encounter.  EXAM: CT HEAD WITHOUT CONTRAST  TECHNIQUE: Contiguous axial images were obtained from the base of the skull through the vertex without intravenous contrast.  COMPARISON:  CT/CTA of the head performed 07/26/2014, and MRI of the brain performed 07/25/2014  FINDINGS: There is an evolving infarct at the anterior right frontal operculum, as previously noted, and at the right insular cortex. The infarct at the insular cortex has increased in size since the prior MRI, involving portions of the right external capsule, with vague superior extension into the subcortical white matter of the right parietal lobe. The known punctate infarct at the high right parietal lobe is not well seen on CT. No definite new infarct is characterized. There is no evidence of intra or extra-axial hemorrhage.  Small chronic lacunar infarcts are noted at the left basal ganglia. A right central 3.3 cm arachnoid cyst is again noted at the posterior fossa.  The brainstem and fourth ventricle are within normal limits. The third and lateral ventricles are unremarkable in appearance. No midline shift is seen.  There is no evidence of fracture; visualized osseous structures are unremarkable in appearance. The orbits are within normal limits. The paranasal sinuses and mastoid air cells are well-aerated. No significant soft tissue abnormalities are seen.  IMPRESSION: 1. No definite new infarct seen at this time. 2. Evolving infarct at the anterior right frontal operculum, as previously noted, and at the right insular cortex. The infarct in the insular cortex has increased in size since the prior MRI, now involving  portions of the right external capsule, with vague superior extension into the subcortical white matter of the right parietal lobe. Known punctate infarct at the high right parietal lobe is not well seen on CT. No evidence of hemorrhagic transformation at this time. 3. Small chronic lacunar infarcts at the left basal ganglia. 4. Right central 3.3 cm arachnoid cyst again seen at the posterior fossa.  These results were called by telephone at the time of interpretation on 07/29/2014 at 8:03 pm to Dr. Armida Sans, who verbally acknowledged these results.   Electronically Signed   By: Garald Balding M.D.   On: 07/29/2014 20:05   Mr Brain Wo Contrast  07/29/2014   ADDENDUM REPORT: 07/29/2014 23:32  ADDENDUM: Acute findings discussed with and reconfirmed by Dr.OSVALDO CAMILO on 07/29/2014 at 11:30 pm.   Electronically Signed   By: Elon Alas M.D.   On: 07/29/2014 23:32   07/29/2014   CLINICAL DATA:  LEFT-sided weakness beginning today, slurred speech and facial droop. Similar  symptoms to prior stroke, however extremity weakness is new. History of hypertension and hyperlipidemia.  EXAM: MRI HEAD WITHOUT CONTRAST  TECHNIQUE: Multiplanar, multiecho pulse sequences of the brain and surrounding structures were obtained without intravenous contrast.  COMPARISON:  CT head July 29, 2014 and MRI of the brain July 25, 2014  FINDINGS: Reduced diffusion within RIGHT posterior insula, new from prior examination, with mild reduced diffusion within the RIGHT lenticulostriate nucleus. Patchy reduced diffusion in the posterior RIGHT frontal lobe/operculum, new from prior examination. These areas show low ADC values. Reduced diffusion within RIGHT frontal operculum, with normalized ADC values consistent with acute to subacute infarct. No susceptibility artifact to suggest hemorrhage.  Ventricles and sulci are overall normal for patient's age. Scattered subcentimeter supratentorial white matter FLAIR T2 hyperintensities exclusive of the  aforementioned on abnormality consistent with chronic small vessel ischemic disease. No midline shift. No significant mass effect.  Posterior fossa arachnoid cyst again noted. No abnormal extra-axial fluid collections. Loss of RIGHT internal carotid artery flow related enhancement again noted, however, poor RIGHT middle cerebral artery flow related enhancement, which appears new from prior imaging.  Ocular globes and orbital contents are unremarkable. Paranasal sinuses are well aerated. Trace mastoid effusions. Mild temporomandibular osteoarthrosis. No abnormal sellar expansion. No cerebellar tonsillar ectopia. No suspicious calvarial bone marrow signal.  IMPRESSION: Acute moderate RIGHT middle cerebral artery territory infarct (further involvement of the operculum, new involvement of the insula and RIGHT basal ganglia), propagated from prior imaging. No hemorrhagic conversion.  Suspected occluded RIGHT internal carotid artery, with slow flow versus occluded RIGHT middle cerebral artery.  Electronically Signed: By: Elon Alas M.D. On: 07/29/2014 23:28       Medical Problem List and Plan: 1. Functional deficits secondary to extension right MCA infarct embolic from right ICA occlusion 2.  DVT Prophylaxis/Anticoagulation: Subcutaneous heparin. Monitor platelet counts of any signs of bleeding 3. Pain Management: Tylenol as needed 4. Tobacco abuse. Continue Nicoderm patch. Provide counseling 5. Neuropsych: This patient is capable of making decisions on his own behalf. 6. Skin/Wound Care: Routine skin checks 7. Fluids/Electrolytes/Nutrition: Routine I&O is with follow-up chemistries 8. Hyperlipidemia. Lipitor 9. Hypertension. No present antihypertensives medication. Patient on Prinzide/Zestoretic 20-12.5 mg daily prior to admission. Resume as needed    Post Admission Physician Evaluation: 1. Functional deficits secondary  to right MCA embolic infarct from right ICA occlusion. 2. Patient is  admitted to receive collaborative, interdisciplinary care between the physiatrist, rehab nursing staff, and therapy team. 3. Patient's level of medical complexity and substantial therapy needs in context of that medical necessity cannot be provided at a lesser intensity of care such as a SNF. 4. Patient has experienced substantial functional loss from his/her baseline which was documented above under the "Functional History" and "Functional Status" headings.  Judging by the patient's diagnosis, physical exam, and functional history, the patient has potential for functional progress which will result in measurable gains while on inpatient rehab.  These gains will be of substantial and practical use upon discharge  in facilitating mobility and self-care at the household level. 5. Physiatrist will provide 24 hour management of medical needs as well as oversight of the therapy plan/treatment and provide guidance as appropriate regarding the interaction of the two. 6. 24 hour rehab nursing will assist with bladder management, bowel management, safety, skin/wound care, disease management, medication administration, pain management and patient education  and help integrate therapy concepts, techniques,education, etc. 7. PT will assess and treat for/with: Lower extremity strength, range of motion, stamina,  balance, functional mobility, safety, adaptive techniques and equipment, NMR, visual-perceptual and cognitive perceptual awareness, pain control, family education, ego support.   Goals are: supervision. 8. OT will assess and treat for/with: ADL's, functional mobility, safety, upper extremity strength, adaptive techniques and equipment, NMR, cognitive perceptual and visual perceptual rx, family education, ego support.   Goals are: supervision to min assist. Therapy may proceed with showering this patient. 9. SLP will assess and treat for/with: cognition, swallowing, communication.  Goals are: mod I to  supervision. 10. Case Management and Social Worker will assess and treat for psychological issues and discharge planning. 11. Team conference will be held weekly to assess progress toward goals and to determine barriers to discharge. 12. Patient will receive at least 3 hours of therapy per day at least 5 days per week. 13. ELOS: 15-18 days       14. Prognosis:  excellent     Meredith Staggers, MD, [place] [facility] 08/01/2014   07/31/2014

## 2014-07-31 NOTE — Progress Notes (Signed)
Speech Language Pathology Treatment: Dysphagia;Cognitive-Linquistic  Patient Details Name: [name] MRN: [id number] DOB: 1955/10/22 Today's Date: 07/31/2014 Time: 3474-2595 SLP Time Calculation (min) (ACUTE ONLY): 23 min  Assessment / Plan / Recommendation Clinical Impression  Upon SLP arrival, pt attempting to climb over bedrail to sit EOB to use urinal. SLP provided Mod cues for safety awareness to assist with toileting. Cognitively, pt required Mod cues to attend to his left environment and Min cues for recall of recommendations from swallowing assessment on previous date. He consumed regular textures and thin liquids with delayed cough x1. Will continue to follow - recommend CIR for f/u therapy.   HPI Other Pertinent Information: Pt is a 59 yo male admitted with worsening left facial droop, slurred speech and left sided weakness.  Pmhx significant for hyperlipidemia, CVA last week, HTN and alcohol abuse per chart review as pt stated he drinks 5-18 beers per day and was a current smoker during last admit.   Pt denies dysphagia MR head on 07/25/14 indicated acute non-hemorrhagic infarct of right frontal operculum and anterior portion of insular cortex; Acute punctate infarct in high right parietal lobe near vertex.  Acute moderate RIGHT middle cerebral artery territory infarct  New MRI during this admit showed Acute moderate RIGHT middle cerebral artery territory infarct.  Speech and swallow evaluation ordered = pt failed RNSSS.    Pertinent Vitals Pain Assessment: Faces Faces Pain Scale: No hurt  SLP Plan  Continue with current plan of care    Recommendations Diet recommendations: Dysphagia 3 (mechanical soft);Thin liquid Liquids provided via: Cup;No straw Medication Administration: Whole meds with liquid Supervision: Patient able to self feed;Full supervision/cueing for compensatory strategies Compensations: Slow rate;Small sips/bites;Check for pocketing Postural Changes and/or Swallow  Maneuvers: Seated upright 90 degrees;Upright 30-60 min after meal       Oral Care Recommendations: Oral care BID Follow up Recommendations: Inpatient Rehab Plan: Continue with current plan of care    Maxcine Ham, M.A. CCC-SLP [phone]  Maxcine Ham 07/31/2014, 2:29 PM

## 2014-08-01 ENCOUNTER — Inpatient Hospital Stay (HOSPITAL_COMMUNITY)
Admission: RE | Admit: 2014-08-01 | Discharge: 2014-08-10 | DRG: 057 | Disposition: A | Discharge status: Home / Self Care | Payer: BLUE CROSS/BLUE SHIELD | Source: Intra-hospital | Attending: Physical Medicine & Rehabilitation | Admitting: Physical Medicine & Rehabilitation

## 2014-08-01 DIAGNOSIS — G8114 Spastic hemiplegia affecting left nondominant side: Secondary | ICD-10-CM | POA: Diagnosis present

## 2014-08-01 DIAGNOSIS — G8190 Hemiplegia, unspecified affecting unspecified side: Secondary | ICD-10-CM | POA: Diagnosis not present

## 2014-08-01 DIAGNOSIS — Z8673 Personal history of transient ischemic attack (TIA), and cerebral infarction without residual deficits: Secondary | ICD-10-CM | POA: Diagnosis present

## 2014-08-01 DIAGNOSIS — R414 Neurologic neglect syndrome: Secondary | ICD-10-CM | POA: Diagnosis present

## 2014-08-01 DIAGNOSIS — G8110 Spastic hemiplegia affecting unspecified side: Secondary | ICD-10-CM | POA: Diagnosis not present

## 2014-08-01 DIAGNOSIS — R12 Heartburn: Secondary | ICD-10-CM | POA: Diagnosis present

## 2014-08-01 DIAGNOSIS — R1314 Dysphagia, pharyngoesophageal phase: Secondary | ICD-10-CM | POA: Diagnosis not present

## 2014-08-01 DIAGNOSIS — I69354 Hemiplegia and hemiparesis following cerebral infarction affecting left non-dominant side: Principal | ICD-10-CM

## 2014-08-01 DIAGNOSIS — R4587 Impulsiveness: Secondary | ICD-10-CM | POA: Diagnosis present

## 2014-08-01 DIAGNOSIS — I69328 Other speech and language deficits following cerebral infarction: Secondary | ICD-10-CM

## 2014-08-01 DIAGNOSIS — E785 Hyperlipidemia, unspecified: Secondary | ICD-10-CM | POA: Diagnosis present

## 2014-08-01 DIAGNOSIS — I10 Essential (primary) hypertension: Secondary | ICD-10-CM | POA: Diagnosis present

## 2014-08-01 DIAGNOSIS — F17210 Nicotine dependence, cigarettes, uncomplicated: Secondary | ICD-10-CM | POA: Diagnosis present

## 2014-08-01 DIAGNOSIS — I63511 Cerebral infarction due to unspecified occlusion or stenosis of right middle cerebral artery: Secondary | ICD-10-CM | POA: Diagnosis not present

## 2014-08-01 DIAGNOSIS — K5900 Constipation, unspecified: Secondary | ICD-10-CM | POA: Diagnosis present

## 2014-08-01 DIAGNOSIS — R110 Nausea: Secondary | ICD-10-CM | POA: Diagnosis present

## 2014-08-01 LAB — BASIC METABOLIC PANEL
Anion gap: 10 (ref 5–15)
BUN: 12 mg/dL (ref 6–20)
CO2: 21 mmol/L — ABNORMAL LOW (ref 22–32)
Calcium: 9.4 mg/dL (ref 8.9–10.3)
Chloride: 104 mmol/L (ref 101–111)
Creatinine, Ser: 0.87 mg/dL (ref 0.61–1.24)
Glucose, Bld: 81 mg/dL (ref 65–99)
Potassium: 4.3 mmol/L (ref 3.5–5.1)
Sodium: 135 mmol/L (ref 135–145)

## 2014-08-01 LAB — CBC
HEMATOCRIT: 42.2 % (ref 39.0–52.0)
HEMOGLOBIN: 14.8 g/dL (ref 13.0–17.0)
MCH: 32.3 pg (ref 26.0–34.0)
MCHC: 35.1 g/dL (ref 30.0–36.0)
MCV: 92.1 fL (ref 78.0–100.0)
PLATELETS: 269 10*3/uL (ref 150–400)
RBC: 4.58 MIL/uL (ref 4.22–5.81)
RDW: 12.4 % (ref 11.5–15.5)
WBC: 10.3 10*3/uL (ref 4.0–10.5)

## 2014-08-01 LAB — GLUCOSE, CAPILLARY
GLUCOSE-CAPILLARY: 102 mg/dL — AB (ref 65–99)
GLUCOSE-CAPILLARY: 83 mg/dL (ref 65–99)
GLUCOSE-CAPILLARY: 126 mg/dL — AB (ref 65–99)
Glucose-Capillary: 113 mg/dL — ABNORMAL HIGH (ref 65–99)

## 2014-08-01 LAB — ALPHA GALACTOSIDASE: ALPHA GALACTOSIDASE, SERUM: 0.179 U/L (ref 0.074–0.457)

## 2014-08-01 MED ORDER — NICOTINE 14 MG/24HR TD PT24
14.0000 mg | MEDICATED_PATCH | Freq: Every day | TRANSDERMAL | Status: DC
  Administered 2014-08-02 – 2014-08-10 (×9): 14 mg via TRANSDERMAL
  Filled 2014-08-01 (×12): qty 1

## 2014-08-01 MED ORDER — CLOPIDOGREL BISULFATE 75 MG PO TABS
75.0000 mg | ORAL_TABLET | Freq: Every day | ORAL | Status: DC
  Administered 2014-08-02 – 2014-08-10 (×9): 75 mg via ORAL
  Filled 2014-08-01 (×12): qty 1

## 2014-08-01 MED ORDER — ATORVASTATIN CALCIUM 80 MG PO TABS: 80.0000 mg | ORAL_TABLET | Freq: Every day | ORAL | Status: DC

## 2014-08-01 MED ORDER — POLYETHYLENE GLYCOL 3350 17 G PO PACK
17.0000 g | PACK | Freq: Every day | ORAL | Status: DC
  Administered 2014-08-01: 17 g via ORAL
  Filled 2014-08-01: qty 1

## 2014-08-01 MED ORDER — ONDANSETRON HCL 4 MG PO TABS: 4.0000 mg | ORAL_TABLET | Freq: Four times a day (QID) | ORAL | Status: DC | PRN

## 2014-08-01 MED ORDER — POLYETHYLENE GLYCOL 3350 17 G PO PACK: 17.0000 g | PACK | Freq: Every day | ORAL | Status: DC

## 2014-08-01 MED ORDER — ASPIRIN EC 81 MG PO TBEC
81.0000 mg | DELAYED_RELEASE_TABLET | Freq: Every day | ORAL | Status: DC
  Administered 2014-08-02 – 2014-08-10 (×9): 81 mg via ORAL
  Filled 2014-08-01 (×12): qty 1

## 2014-08-01 MED ORDER — HEPARIN SODIUM (PORCINE) 5000 UNIT/ML IJ SOLN
5000.0000 [IU] | Freq: Three times a day (TID) | INTRAMUSCULAR | Status: DC
  Administered 2014-08-01 – 2014-08-08 (×19): 5000 [IU] via SUBCUTANEOUS
  Filled 2014-08-01 (×23): qty 1

## 2014-08-01 MED ORDER — ACETAMINOPHEN 325 MG PO TABS: 650.0000 mg | ORAL_TABLET | Freq: Four times a day (QID) | ORAL | Status: DC | PRN

## 2014-08-01 MED ORDER — HEPARIN SODIUM (PORCINE) 5000 UNIT/ML IJ SOLN: 5000.0000 [IU] | Freq: Three times a day (TID) | INTRAMUSCULAR | Status: DC

## 2014-08-01 MED ORDER — SORBITOL 70 % SOLN: 30.0000 mL | Freq: Every day | Status: DC | PRN

## 2014-08-01 MED ORDER — ATORVASTATIN CALCIUM 80 MG PO TABS
80.0000 mg | ORAL_TABLET | Freq: Every day | ORAL | Status: DC
  Administered 2014-08-01 – 2014-08-09 (×8): 80 mg via ORAL
  Filled 2014-08-01 (×10): qty 1

## 2014-08-01 MED ORDER — NICOTINE 14 MG/24HR TD PT24: 14.0000 mg | MEDICATED_PATCH | Freq: Every day | TRANSDERMAL | Status: DC

## 2014-08-01 MED ORDER — ONDANSETRON HCL 4 MG/2ML IJ SOLN: 4.0000 mg | Freq: Four times a day (QID) | INTRAMUSCULAR | Status: DC | PRN

## 2014-08-01 MED ORDER — SENNOSIDES-DOCUSATE SODIUM 8.6-50 MG PO TABS: 1.0000 | ORAL_TABLET | Freq: Every evening | ORAL | Status: DC | PRN

## 2014-08-01 NOTE — PMR Pre-admission (Signed)
PMR Admission Coordinator Pre-Admission Assessment  Patient: [name] is an 59 y.o., male MRN: [id number] DOB: 1955/11/23 Height: 5\' 6"  (167.6 cm) Weight: 76.658 kg (169 lb)              Insurance Information HMO:     PPO:      PCP:      IPA:      80/20:      OTHER: Blue [employer] Plan PRIMARY:  [employer] [place]       Policy#: [id number] Subscriber:  self CM Name:  Virl Diamond      Phone#: [phone]     Fax#:  [phone] Pre-Cert#:  [id number];  authorized for admission 08/01/14  thru 08/10/14 with updates due 08/09/14   Employer:  [employer] Benefits:  Phone #:  [phone]     Name:  Sharren Bridge. Date:  02/16/14     Deduct:  $1750      Out of Pocket Max: $3500      Life Max:  none CIR:  70%30%      SNF: 70%/30% Outpatient:  70%/30%     Co-Pay: $0 copay, 60 visit max Home Health:  70%/30%      Co-Pay:  DME: 70%/30%     Co-Pay:   Providers:  In network SECONDARY:       Policy#:       Subscriber:  CM Name:       Phone#:      Fax#:  Pre-Cert#:       Employer:  Benefits:  Phone #:      Name:  Eff. Date:      Deduct:       Out of Pocket Max:       Life Max:  CIR:       SNF:  Outpatient:      Co-Pay:  Home Health:       Co-Pay:  DME:      Co-Pay:   Medicaid Application Date:      Case Manager:  Disability Application Date:       Case Worker:   Emergency Contact Information Contact Information    Name Relation Home Work Mobile   Mogensen,Janet Spouse [phone]  [phone]   [place] Mother   [phone]   Lori,Courtney Daughter   [phone]     Current Medical History  Patient Admitting Diagnosis: extension of right MCA infarct (embolic from Right ICA) History of Present Illness: [name] is a 59 y.o. right handed male with history of hypertension as well as hyperlipidemia with right MCA territory infarct on 07/25/2014 with single right M2 branch occlusion identified just beyond the bifurcation and occluded right ICA origin. Underwent loop recorder placement per Dr. Ladona Ridgel and  discharge to home on aspirin as well as Plavix 07/27/2014 independent without assistive device. Patient was independent prior to initial admission 07/25/2014 working full time living with his wife. Presented 07/29/2014 with increasing left-sided weakness and slurred speech. MRI of the brain showed acute moderate right middle cerebral artery territory infarct further involvement of the operculum, new involvement of the insula and right basal ganglia propagated from prior imaging. Recent echocardiogram with ejection fraction of 70% grade 1 diastolic dysfunction. Recent venous Doppler studies negative for DVT. Patient did not receive TPA. Remains on aspirin and Plavix therapy per neurology services. Subcutaneous heparin added for DVT prophylaxis. Tolerating a mechanical soft diet. Physical and occupational therapy evaluations completed 07/30/2014 with recommendations of physical medicine rehabilitation consult. Patient was admitted for a comprehensive rehabilitation  program Total: 9 [facility]    Past Medical History  Past Medical History  Diagnosis Date  . Arthritis   . Hypertension   . Hyperlipidemia     Family History  family history includes Cancer in his father; Heart attack in his brother.  Prior Rehab/Hospitalizations:  Has the patient had major surgery during 100 days prior to admission? No  Current Medications   Current facility-administered medications:  .  acetaminophen (TYLENOL) tablet 650 mg, 650 mg, Oral, Q6H PRN, Erasmo Downer, MD, 650 mg at 08/01/14 1035 .  aspirin EC tablet 81 mg, 81 mg, Oral, Daily, Narda Bonds, MD, 81 mg at 08/01/14 1034 .  atorvastatin (LIPITOR) tablet 80 mg, 80 mg, Oral, q1800, Joanna Puff, MD, 80 mg at 07/31/14 1834 .  clopidogrel (PLAVIX) tablet 75 mg, 75 mg, Oral, Daily, Narda Bonds, MD, 75 mg at 08/01/14 1034 .  heparin injection 5,000 Units, 5,000 Units, Subcutaneous, 3 times per day, Narda Bonds, MD, 5,000 Units at 08/01/14 0602 .   nicotine (NICODERM CQ - dosed in mg/24 hours) patch 14 mg, 14 mg, Transdermal, Daily, Ashly M Gottschalk, DO, 14 mg at 08/01/14 1035 .  polyethylene glycol (MIRALAX / GLYCOLAX) packet 17 g, 17 g, Oral, Daily, Ashly M Gottschalk, DO .  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  Pt. independent and drives PTA.  Home Assistive Devices / Equipment Home Assistive Devices/Equipment: Cane (specify quad or straight), Walker (specify type) Home Equipment: None  Prior Device Use: Indicate devices/aids used by the patient prior to current illness, exacerbation or injury? None of the above  Prior Functional Level Prior Function Level of Independence: Independent Comments: works as Merchandiser, retail at [employer] Care: Did the patient need help bathing, dressing, using the toilet or eating?  Independent  Indoor Mobility: Did the patient need assistance with walking from room to room (with or without device)? Independent  Stairs: Did the patient need assistance with internal or external stairs (with or without device)? Independent  Functional Cognition: Did the patient need help planning regular tasks such as shopping or remembering to take medications? Independent  Current Functional Level Cognition  Arousal/Alertness: Awake/alert Overall Cognitive Status: Impaired/Different from baseline Current Attention Level: Sustained Orientation Level: Oriented X4 Following Commands: Follows one step commands inconsistently, Follows one step commands with  increased time Safety/Judgement: Decreased awareness of safety, Decreased awareness of deficits General Comments: improvements in impulsivity and awareness noted this session compared to evaluation Attention: Sustained, Selective Sustained Attention: Appears intact Selective Attention: Impaired (? behavior component as pt required cues to turn off tv volume to participate in eval) Selective Attention Impairment: Verbal basic, Functional basic Awareness: Impaired Awareness Impairment: Intellectual impairment (decr awareness to attended to left deficits) Problem Solving: Impaired (admitted to need to use call bell for assist - needed assist to locate in bed *was on his left) Problem Solving Impairment: Functional basic Behaviors: Impulsive    Extremity Assessment (includes Sensation/Coordination)  Upper Extremity Assessment: LUE deficits/detail LUE Deficits / Details: tone noted, muscle belly tapping in  supine able to activate tricep x2, no active movement in L hand noted LUE Sensation: decreased light touch, decreased proprioception LUE Coordination: decreased fine motor, decreased gross motor  Lower Extremity Assessment: LLE deficits/detail LLE Deficits / Details: weakness noted, 3-/5 hip flexion, knee extension, 3/5 dorsiflexion LLE Sensation: decreased light touch LLE Coordination: decreased fine motor, decreased gross motor    ADLs  Overall ADL's : Needs assistance/impaired Upper Body Dressing : Maximal assistance, Bed level Upper Body Dressing Details (indicate cue type and reason): pt unaware L arm dressed and attempting to pull at gown again to dress it.  Lower Body Dressing: Maximal assistance General ADL Comments: Pt will require (A) for all adls due to balance deficits, impulsive and L inattention. Pt required additional cues to scan tray to locate phone. Pt required cues to turn head to read clock. Pt only reading the R side of phone screen. Pt unaware of neglect of L side even  with cues. Pt repeating statement with no awareness to error. pt wet on L side and gown changed. Pt unable to feel wet clothing.     Mobility  Overal bed mobility: Needs Assistance Bed Mobility: Rolling, Sidelying to Sit Rolling: Min assist Sidelying to sit: Mod assist Supine to sit: Mod assist Sit to supine: Mod assist General bed mobility comments: VCs for positioning with rolling and sidelying to sit technique, assist to initiate and power to upright    Transfers  Overall transfer level: Needs assistance Equipment used: Rolling walker (2 wheeled) Transfers: Sit to/from Stand Sit to Stand: Min assist General transfer comment: Assist to manual attend to left hand on RW. VCs for safety with elevation to standing and controlling descent to chair    Ambulation / Gait / Stairs / Wheelchair Mobility  Ambulation/Gait Ambulation/Gait assistance: Mod assist Ambulation Distance (Feet): 30 Feet Assistive device: Rolling walker (2 wheeled) General Gait Details: improvements noted in awareness of LUE for RW use, able to maintain lose grip on RW today. VCs for patient to open eyes during mobility    Posture / Balance Balance Overall balance assessment: Needs assistance Sitting-balance support: Single extremity supported Sitting balance-Leahy Scale: Fair Postural control: Posterior lean, Left lateral lean Standing balance support: Bilateral upper extremity supported Standing balance-Leahy Scale: Poor    Special needs/care consideration BiPAP/CPAP  no         Oxygen  no Special Bed  no      Skin  Intact per pt. And family                               Bowel mgmt: last BM 07/29/14 ;  I advised RN of this and she was to address prior to admission to CIR later today Bladder mgmt: continent, using urinal Diabetic mgmt no     Previous Home Environment Living Arrangements: Spouse/significant other, Other (Comment)  Lives With: Spouse, Other (Comment) Available Help at Discharge: Family Type  of Home: House Home Layout: One level Home Access: Stairs to enter Entrance Stairs-Rails: Right, Left Entrance Stairs-Number of Steps: 2 Bathroom Shower/Tub: Tub/shower unit, Engineer, building services: Standard Home Care Services: No  Discharge Living Setting Plans for Discharge Living Setting: Patient's home Type of Home at Discharge: House Discharge Home Layout: One level Discharge Home Access: Stairs to enter Entrance Stairs-Rails: Right, Left, Can reach both Entrance Stairs-Number of Steps: 2 Discharge Bathroom Shower/Tub: Tub/shower unit Discharge Bathroom Toilet: Standard Discharge Bathroom Accessibility: Yes How Accessible: Accessible  via walker Does the patient have any problems obtaining your medications?: No  Social/Family/Support Systems Patient Roles: Spouse, Parent Anticipated Caregiver: wife, Blanche Gallien; daughter Race Latour (has 2 yound sons, not employed); pt's Mom [place].  Wife reportedly has a "rare brain disorder" and is on disability.  She is quite emotionally labile. Ability/Limitations of Caregiver: family will be able to provide 24 hour supervision and minimal assist. Caregiver Availability: 24/7 Discharge Plan Discussed with Primary Caregiver: Yes Is Caregiver In Agreement with Plan?: Yes Does Caregiver/Family have Issues with Lodging/Transportation while Pt is in Rehab?: No   Goals/Additional Needs Patient/Family Goal for Rehab: supervision and min assist for PT/OT; supervision for SLP Expected length of stay: 14-18 days Cultural Considerations: none Equipment Needs: TBD Additional Information: Per pt. and his mother, pt's wife Jan has a "rare brain disorder" and is on disability.  She has memory issues and notable emotional lability. Pt/Family Agrees to Admission and willing to participate: Yes Program Orientation Provided & Reviewed with Pt/Caregiver Including Roles  & Responsibilities: Yes Additional Information Needs: Wife write copious  notes to compensate for her memory issues   Decrease burden of Care through IP rehab admission:    Possible need for SNF placement upon discharge:   Not anticipated   Patient Condition: The patient's condition remains as documented in the consult dated 07/31/14, in which the Rehabilitation Physician determined and documented that the patient's condition is appropriate for intensive rehabilitative care in an inpatient rehabilitation facility.  Will admit to inpatient rehab today.  Preadmission Screen Completed By:  Weldon Picking, 08/01/2014 11:36 AM ______________________________________________________________________   Discussed status with Dr.  Riley Kill on 08/01/14 at  1136  and received telephone approval for admission today.  Admission Coordinator:  Weldon Picking, time 1136 Dorna Bloom  08/01/14

## 2014-08-01 NOTE — Progress Notes (Signed)
Ranelle Oyster, MD Physician Signed Physical Medicine and Rehabilitation Consult Note 07/30/2014 2:21 PM  Related encounter: ED to Hosp-Admission (Current) from 07/29/2014 in [facility]    Expand All Collapse All        Physical Medicine and Rehabilitation Consult Reason for Consult: Acute right MCA infarct Referring Physician: [facility]   HPI: [name] is a 59 y.o. right handed male with history of hypertension as well as hyperlipidemia with right MCA territory infarct on 07/25/2014 with single right M2 branch occlusion identified just beyond the bifurcation and occluded right ICA origin. Underwent loop recorder placement per Dr. Ladona Ridgel and discharge to home on aspirin as well as Plavix 07/27/2014 independent without assistive device. Patient was independent prior to initial admission 07/25/2014 working full time living with his wife. Presented 07/29/2014 with increasing left-sided weakness and slurred speech. MRI of the brain showed acute moderate right middle cerebral artery territory infarct further involvement of the operculum, new involvement of the insula and right basal ganglia propagated from prior imaging. Recent echocardiogram with ejection fraction of 70% grade 1 diastolic dysfunction. Recent venous Doppler studies negative for DVT. Patient did not receive TPA. Remains on aspirin and Plavix therapy per neurology services. Subcutaneous heparin added for DVT prophylaxis. Tolerating a mechanical soft diet. Physical and occupational therapy evaluations completed 07/30/2014 with recommendations of physical medicine rehabilitation consult.   Review of Systems  Constitutional: Negative for fever and chills.  HENT: Negative for hearing loss.  Eyes: Negative for blurred vision and double vision.  Respiratory: Negative for cough and shortness of breath.  Cardiovascular: Negative for leg swelling.  Gastrointestinal: Positive for heartburn, nausea and  constipation.  Musculoskeletal: Positive for myalgias.  Skin: Negative for rash.  Neurological: Positive for dizziness and headaches. Negative for tingling and weakness.   Past Medical History  Diagnosis Date  . Arthritis   . Hypertension   . Hyperlipidemia    Past Surgical History  Procedure Laterality Date  . Loop recorder implant  07/27/14    LOOP REVEAL LINQ [id number] - [id number]  . Tee without cardioversion N/A 07/27/2014    Procedure: TRANSESOPHAGEAL ECHOCARDIOGRAM (TEE); Surgeon: Chrystie Nose, MD; Location: [facility]; Service: Cardiovascular; Laterality: N/A;  . Ep implantable device N/A 07/27/2014    Procedure: Loop Recorder Insertion; Surgeon: Marinus Maw, MD; Location: [facility] INVASIVE CV LAB; Service: Cardiovascular; Laterality: N/A;   Family History  Problem Relation Age of Onset  . Cancer Father   . Heart attack Brother    Social History:  reports that he has been smoking Cigarettes. He has a 20 pack-year smoking history. He does not have any smokeless tobacco history on file. He reports that he drinks about 10.8 oz of alcohol per week. He reports that he does not use illicit drugs. Allergies: No Known Allergies Medications Prior to Admission  Medication Sig Dispense Refill  . aspirin EC 81 MG EC tablet Take 1 tablet (81 mg total) by mouth daily. 30 tablet 0  . atorvastatin (LIPITOR) 40 MG tablet Take 1 tablet (40 mg total) by mouth daily. 30 tablet 5  . clopidogrel (PLAVIX) 75 MG tablet Take 1 tablet (75 mg total) by mouth daily. 30 tablet 0  . ibuprofen (ADVIL,MOTRIN) 200 MG tablet Take 400 mg by mouth every 6 (six) hours as needed (pain).     Marland Kitchen lisinopril-hydrochlorothiazide (PRINZIDE,ZESTORETIC) 20-12.5 MG per tablet Take 1 tablet by mouth daily. 30 tablet 5    Home: Home Living Family/patient expects to  be discharged to:: Private residence Living Arrangements:  Spouse/significant other, Other (Comment) Available Help at Discharge: Family Type of Home: House Home Access: Stairs to enter Secretary/administrator of Steps: 2 Entrance Stairs-Rails: Right, Left Home Layout: One level Home Equipment: None Lives With: Spouse, Other (Comment)  Functional History: Prior Function Level of Independence: Independent Comments: works as Merchandiser, retail at [employer] Status:  Mobility: Bed Mobility Overal bed mobility: Needs Assistance Bed Mobility: Sit to Supine Supine to sit: Mod assist Sit to supine: Mod assist General bed mobility comments: VCs for positioning, assist for safety and mobility, patient very impulsive with poor ability to follow comands for return to supine, attempting to lay down sideways towards the left without fully being on the bed Transfers Overall transfer level: Needs assistance Equipment used: 1 person hand held assist Transfers: Sit to/from Stand Sit to Stand: Max assist General transfer comment: Patient significantly impulsive, able to power to standing but required max assist for safety and mobility, patient attempting to launch himself out of chair ov 3 occassions with no recognition of positioning re: left side.  Ambulation/Gait Ambulation/Gait assistance: Max assist Ambulation Distance (Feet): 4 Feet Assistive device: 1 person hand held assist General Gait Details: required full body support with nearly fally, manual assist to hold upright, manual and verbal cues for initiation of step, Patient extremely impulsive and lacks insight and awarenss ZO:[id number] left side    ADL: ADL Overall ADL's : Needs assistance/impaired Upper Body Dressing : Maximal assistance, Bed level Upper Body Dressing Details (indicate cue type and reason): pt unaware L arm dressed and attempting to pull at gown again to dress it.  Lower Body Dressing: Maximal assistance General ADL Comments: Pt will require (A) for all adls due to  balance deficits, impulsive and L inattention. Pt required additional cues to scan tray to locate phone. Pt required cues to turn head to read clock. Pt only reading the R side of phone screen. Pt unaware of neglect of L side even with cues. Pt repeating statement with no awareness to error. pt wet on L side and gown changed. Pt unable to feel wet clothing.   Cognition: Cognition Overall Cognitive Status: Impaired/Different from baseline Arousal/Alertness: Awake/alert Orientation Level: Oriented X4 Attention: Sustained, Selective Sustained Attention: Appears intact Selective Attention: Impaired (? behavior component as pt required cues to turn off tv volume to participate in eval) Selective Attention Impairment: Verbal basic, Functional basic Awareness: Impaired Awareness Impairment: Intellectual impairment (decr awareness to attended to left deficits) Problem Solving: Impaired (admitted to need to use call bell for assist - needed assist to locate in bed *was on his left) Problem Solving Impairment: Functional basic Behaviors: Impulsive Cognition Arousal/Alertness: Awake/alert Behavior During Therapy: Impulsive Overall Cognitive Status: Impaired/Different from baseline Area of Impairment: Attention, Following commands, Safety/judgement, Awareness, Problem solving Orientation Level: Disoriented to, Time, Situation Current Attention Level: Sustained Memory: Decreased recall of precautions, Decreased short-term memory Following Commands: Follows one step commands inconsistently, Follows one step commands with increased time Safety/Judgement: Decreased awareness of safety, Decreased awareness of deficits Awareness: Intellectual Problem Solving: Slow processing, Decreased initiation, Difficulty sequencing General Comments: Patient oreiented x3 this session but pt remains with L inattention, impulisve at times, pt demonstrates visual deficits and lack of awareness  Blood pressure 120/70,  pulse 59, temperature 97.9 F (36.6 C), temperature source Oral, resp. rate 18, height 5\' 6"  (1.676 m), weight 76.658 kg (169 lb), SpO2 97 %. Physical Exam  Constitutional: He appears well-developed.  HENT:  Left  facial droop  Eyes:  Pupils reactive to light  Neck: Normal range of motion. Neck supple. No thyromegaly present.  Cardiovascular: Normal rate and regular rhythm.  Respiratory: Effort normal and breath sounds normal. No respiratory distress.  GI: Soft. Bowel sounds are normal. He exhibits no distension.  Musculoskeletal: He exhibits no edema or tenderness.  Neurological:  Lethargic but arousable. Right gaze preference. Provide his name and age date of birth and follow simple commands. Fair awareness of deficits. LUE 2 delt, 2- bic, 2- tricep, trace wrist, 0 fingers. LLE: 2+ to 3/5 HF, KE and ADF/APF. Decreased PP and LT left arm and leg. Left central 7 and tongue deviation.  Skin: Skin is warm and dry.  Psychiatric:  flat     Lab Results Last 24 Hours    Results for orders placed or performed during the hospital encounter of 07/29/14 (from the past 24 hour(s))  CBG monitoring, ED Status: Abnormal   Collection Time: 07/29/14 7:42 PM  Result Value Ref Range   Glucose-Capillary 126 (H) 65 - 99 mg/dL   Comment 1 Notify RN   Protime-INR Status: None   Collection Time: 07/29/14 7:45 PM  Result Value Ref Range   Prothrombin Time 13.5 11.6 - 15.2 seconds   INR 1.01 0.00 - 1.49  APTT Status: None   Collection Time: 07/29/14 7:45 PM  Result Value Ref Range   aPTT 36 24 - 37 seconds  CBC Status: Abnormal   Collection Time: 07/29/14 7:45 PM  Result Value Ref Range   WBC 12.0 (H) 4.0 - 10.5 K/uL   RBC 4.80 4.22 - 5.81 MIL/uL   Hemoglobin 15.8 13.0 - 17.0 g/dL   HCT 16.1 09.6 - 04.5 %   MCV 91.7 78.0 - 100.0 fL   MCH 32.9 26.0 - 34.0 pg   MCHC 35.9 30.0 - 36.0 g/dL   RDW 40.9  81.1 - 91.4 %   Platelets 282 150 - 400 K/uL  Differential Status: Abnormal   Collection Time: 07/29/14 7:45 PM  Result Value Ref Range   Neutrophils Relative % 62 43 - 77 %   Neutro Abs 7.4 1.7 - 7.7 K/uL   Lymphocytes Relative 28 12 - 46 %   Lymphs Abs 3.3 0.7 - 4.0 K/uL   Monocytes Relative 9 3 - 12 %   Monocytes Absolute 1.1 (H) 0.1 - 1.0 K/uL   Eosinophils Relative 1 0 - 5 %   Eosinophils Absolute 0.2 0.0 - 0.7 K/uL   Basophils Relative 0 0 - 1 %   Basophils Absolute 0.1 0.0 - 0.1 K/uL  Comprehensive metabolic panel Status: Abnormal   Collection Time: 07/29/14 7:45 PM  Result Value Ref Range   Sodium 131 (L) 135 - 145 mmol/L   Potassium 3.8 3.5 - 5.1 mmol/L   Chloride 97 (L) 101 - 111 mmol/L   CO2 20 (L) 22 - 32 mmol/L   Glucose, Bld 121 (H) 65 - 99 mg/dL   BUN 14 6 - 20 mg/dL   Creatinine, Ser 7.82 (H) 0.61 - 1.24 mg/dL   Calcium 9.7 8.9 - 95.6 mg/dL   Total Protein 8.5 (H) 6.5 - 8.1 g/dL   Albumin 4.1 3.5 - 5.0 g/dL   AST 34 15 - 41 U/L   ALT 36 17 - 63 U/L   Alkaline Phosphatase 65 38 - 126 U/L   Total Bilirubin 0.7 0.3 - 1.2 mg/dL   GFR calc non Af Amer 59 (L) >60 mL/min   GFR calc Af  Amer >60 >60 mL/min   Anion gap 14 5 - 15  I-stat troponin, ED (not at [facility], [facility]) Status: None   Collection Time: 07/29/14 7:47 PM  Result Value Ref Range   Troponin i, poc 0.00 0.00 - 0.08 ng/mL   Comment 3     I-Stat Chem 8, ED (not at [facility], [facility]) Status: Abnormal   Collection Time: 07/29/14 7:49 PM  Result Value Ref Range   Sodium 135 135 - 145 mmol/L   Potassium 3.8 3.5 - 5.1 mmol/L   Chloride 99 (L) 101 - 111 mmol/L   BUN 16 6 - 20 mg/dL   Creatinine, Ser 1.61 0.61 - 1.24 mg/dL   Glucose, Bld 096 (H) 65 - 99 mg/dL   Calcium, Ion 0.45 4.09 - 1.23 mmol/L   TCO2 19 0 - 100  mmol/L   Hemoglobin 17.0 13.0 - 17.0 g/dL   HCT 81.1 91.4 - 78.2 %  I-Stat CG4 Lactic Acid, ED Status: Abnormal   Collection Time: 07/29/14 7:51 PM  Result Value Ref Range   Lactic Acid, Venous 2.69 (HH) 0.5 - 2.0 mmol/L   Comment NOTIFIED PHYSICIAN   CBG monitoring, ED Status: Abnormal   Collection Time: 07/29/14 7:54 PM  Result Value Ref Range   Glucose-Capillary 107 (H) 65 - 99 mg/dL  Lactic acid, plasma Status: None   Collection Time: 07/30/14 1:06 AM  Result Value Ref Range   Lactic Acid, Venous 2.0 0.5 - 2.0 mmol/L  Glucose, capillary Status: None   Collection Time: 07/30/14 6:07 AM  Result Value Ref Range   Glucose-Capillary 99 65 - 99 mg/dL      Imaging Results (Last 48 hours)    Ct Head (brain) Wo Contrast  07/29/2014 CLINICAL DATA: Code stroke. Left-sided weakness and left facial droop. Confusion and diaphoresis. Recent CVA. Initial encounter. EXAM: CT HEAD WITHOUT CONTRAST TECHNIQUE: Contiguous axial images were obtained from the base of the skull through the vertex without intravenous contrast. COMPARISON: CT/CTA of the head performed 07/26/2014, and MRI of the brain performed 07/25/2014 FINDINGS: There is an evolving infarct at the anterior right frontal operculum, as previously noted, and at the right insular cortex. The infarct at the insular cortex has increased in size since the prior MRI, involving portions of the right external capsule, with vague superior extension into the subcortical white matter of the right parietal lobe. The known punctate infarct at the high right parietal lobe is not well seen on CT. No definite new infarct is characterized. There is no evidence of intra or extra-axial hemorrhage. Small chronic lacunar infarcts are noted at the left basal ganglia. A right central 3.3 cm arachnoid cyst is again noted at the posterior fossa. The brainstem and fourth ventricle are within  normal limits. The third and lateral ventricles are unremarkable in appearance. No midline shift is seen. There is no evidence of fracture; visualized osseous structures are unremarkable in appearance. The orbits are within normal limits. The paranasal sinuses and mastoid air cells are well-aerated. No significant soft tissue abnormalities are seen. IMPRESSION: 1. No definite new infarct seen at this time. 2. Evolving infarct at the anterior right frontal operculum, as previously noted, and at the right insular cortex. The infarct in the insular cortex has increased in size since the prior MRI, now involving portions of the right external capsule, with vague superior extension into the subcortical white matter of the right parietal lobe. Known punctate infarct at the high right parietal lobe is not well seen on  CT. No evidence of hemorrhagic transformation at this time. 3. Small chronic lacunar infarcts at the left basal ganglia. 4. Right central 3.3 cm arachnoid cyst again seen at the posterior fossa. These results were called by telephone at the time of interpretation on 07/29/2014 at 8:03 pm to Dr. Leroy Kennedy, who verbally acknowledged these results. Electronically Signed By: Roanna Raider M.D. On: 07/29/2014 20:05   Mr Brain Wo Contrast  07/29/2014 ADDENDUM REPORT: 07/29/2014 23:32 ADDENDUM: Acute findings discussed with and reconfirmed by Dr.OSVALDO CAMILO on 07/29/2014 at 11:30 pm. Electronically Signed By: Awilda Metro M.D. On: 07/29/2014 23:32   07/29/2014 CLINICAL DATA: LEFT-sided weakness beginning today, slurred speech and facial droop. Similar symptoms to prior stroke, however extremity weakness is new. History of hypertension and hyperlipidemia. EXAM: MRI HEAD WITHOUT CONTRAST TECHNIQUE: Multiplanar, multiecho pulse sequences of the brain and surrounding structures were obtained without intravenous contrast. COMPARISON: CT head July 29, 2014 and MRI of the brain July 25, 2014 FINDINGS: Reduced diffusion within RIGHT posterior insula, new from prior examination, with mild reduced diffusion within the RIGHT lenticulostriate nucleus. Patchy reduced diffusion in the posterior RIGHT frontal lobe/operculum, new from prior examination. These areas show low ADC values. Reduced diffusion within RIGHT frontal operculum, with normalized ADC values consistent with acute to subacute infarct. No susceptibility artifact to suggest hemorrhage. Ventricles and sulci are overall normal for patient's age. Scattered subcentimeter supratentorial white matter FLAIR T2 hyperintensities exclusive of the aforementioned on abnormality consistent with chronic small vessel ischemic disease. No midline shift. No significant mass effect. Posterior fossa arachnoid cyst again noted. No abnormal extra-axial fluid collections. Loss of RIGHT internal carotid artery flow related enhancement again noted, however, poor RIGHT middle cerebral artery flow related enhancement, which appears new from prior imaging. Ocular globes and orbital contents are unremarkable. Paranasal sinuses are well aerated. Trace mastoid effusions. Mild temporomandibular osteoarthrosis. No abnormal sellar expansion. No cerebellar tonsillar ectopia. No suspicious calvarial bone marrow signal. IMPRESSION: Acute moderate RIGHT middle cerebral artery territory infarct (further involvement of the operculum, new involvement of the insula and RIGHT basal ganglia), propagated from prior imaging. No hemorrhagic conversion. Suspected occluded RIGHT internal carotid artery, with slow flow versus occluded RIGHT middle cerebral artery. Electronically Signed: By: Awilda Metro M.D. On: 07/29/2014 23:28     Assessment/Plan: Diagnosis: extension of right MCA infarct (embolic from Right ICA) 1. Does the need for close, 24 hr/day medical supervision in concert with the patient's rehab needs make it unreasonable for this patient to be served in a  less intensive setting? Yes 2. Co-Morbidities requiring supervision/potential complications: htn, post-stroke sequelae 3. Due to bladder management, bowel management, safety, skin/wound care, disease management, medication administration and patient education, does the patient require 24 hr/day rehab nursing? Yes 4. Does the patient require coordinated care of a physician, rehab nurse, PT (1-2 hrs/day, 5 days/week), OT (1-2 hrs/day, 5 days/week) and SLP (1-2 hrs/day, 5 days/week) to address physical and functional deficits in the context of the above medical diagnosis(es)? Yes Addressing deficits in the following areas: balance, endurance, locomotion, strength, transferring, bowel/bladder control, bathing, dressing, feeding, grooming, toileting, cognition, speech, language, swallowing and psychosocial support 5. Can the patient actively participate in an intensive therapy program of at least 3 hrs of therapy per day at least 5 days per week? Yes 6. The potential for patient to make measurable gains while on inpatient rehab is excellent 7. Anticipated functional outcomes upon discharge from inpatient rehab are supervision and min assist with PT, supervision and min assist with  OT, supervision with SLP. 8. Estimated rehab length of stay to reach the above functional goals is: 14-18 days 9. Does the patient have adequate social supports and living environment to accommodate these discharge functional goals? Yes 10. Anticipated D/C setting: Home 11. Anticipated post D/C treatments: HH therapy and Outpatient therapy 12. Overall Rehab/Functional Prognosis: excellent  RECOMMENDATIONS: This patient's condition is appropriate for continued rehabilitative care in the following setting: CIR Patient has agreed to participate in recommended program. Yes Note that insurance prior authorization may be required for reimbursement for recommended care.  Comment: Rehab Admissions Coordinator to follow  up.  Thanks,  Ranelle Oyster, MD, [facility]     07/30/2014       Revision History

## 2014-08-01 NOTE — Progress Notes (Signed)
Critical Illness Forms filled out.  Copy left with patient.  Will fax originals to provided number on forms.

## 2014-08-01 NOTE — Interval H&P Note (Signed)
[name] was admitted today to Inpatient Rehabilitation with the diagnosis of right MCA infarct.  The patient's history has been reviewed, patient examined, and there is no change in status.  Patient continues to be appropriate for intensive inpatient rehabilitation.  I have reviewed the patient's chart and labs.  Questions were answered to the patient's satisfaction. The PAPE has been reviewed and assessment remains appropriate.  Hoyt Leanos T 08/01/2014, 9:11 PM

## 2014-08-01 NOTE — Progress Notes (Signed)
Physical Therapy Treatment Patient Details Name: [name] MRN: [id number] DOB: [birth year] Today's Date: 08/01/2014    History of Present Illness 59 yo male admitted with L facial droop, slurred, and L sided weakness. MRI (+) acute R MCA ( new R basal ganglia) Recent d/c 07/27/14 from [facility] s/p loop recorder, Tee, CVA workup. PMH: arthritis, HTN    PT Comments    Patient progressing well with therapies. Demonstrates improvements in strength and use of Left side. Less assist required during mobility at this time. Will continue to see and progress as tolerated.  Follow Up Recommendations  CIR;Supervision/Assistance - 24 hour     Equipment Recommendations  Other (comment) (tbd)    Recommendations for Other Services Rehab consult     Precautions / Restrictions Precautions Precautions: Fall Restrictions Weight Bearing Restrictions: No    Mobility  Bed Mobility Overal bed mobility: Needs Assistance Bed Mobility: Rolling;Sidelying to Sit Rolling: Min assist Sidelying to sit: Mod assist       General bed mobility comments: VCs for positioning with rolling and sidelying to sit technique, assist to initiate and power to upright  Transfers Overall transfer level: Needs assistance Equipment used: Rolling walker (2 wheeled) Transfers: Sit to/from Stand Sit to Stand: Min assist         General transfer comment: Assist to manual attend to left hand on RW. VCs for safety with elevation to standing and controlling descent to chair  Ambulation/Gait Ambulation/Gait assistance: Mod assist Ambulation Distance (Feet): 30 Feet Assistive device: Rolling walker (2 wheeled)       General Gait Details: improvements noted in awareness of LUE for RW use, able to maintain lose grip on RW today. VCs for patient to open eyes during mobility   Stairs            Wheelchair Mobility    Modified Rankin (Stroke Patients Only)       Balance   Sitting-balance support: Single  extremity supported Sitting balance-Leahy Scale: Fair     Standing balance support: Bilateral upper extremity supported Standing balance-Leahy Scale: Poor                      Cognition Arousal/Alertness: Awake/alert Behavior During Therapy: Flat affect;Impulsive Overall Cognitive Status: Impaired/Different from baseline Area of Impairment: Attention;Safety/judgement;Awareness;Problem solving   Current Attention Level: Sustained Memory: Decreased recall of precautions;Decreased short-term memory   Safety/Judgement: Decreased awareness of safety;Decreased awareness of deficits Awareness: Intellectual Problem Solving: Slow processing;Decreased initiation;Difficulty sequencing      Exercises General Exercises - Lower Extremity Ankle Circles/Pumps: AROM;Left;10 reps (against resistance) Long Arc Quad: AROM;Left;10 reps    General Comments        Pertinent Vitals/Pain Pain Assessment: Faces Faces Pain Scale: Hurts a little bit Pain Location: headache Pain Descriptors / Indicators: Headache Pain Intervention(s): Monitored during session    Home Living                      Prior Function            PT Goals (current goals can now be found in the care plan section) Acute Rehab PT Goals Patient Stated Goal: to lay down PT Goal Formulation: With patient/family Time For Goal Achievement: 08/13/14 Potential to Achieve Goals: Good Progress towards PT goals: Progressing toward goals    Frequency  Min 3X/week    PT Plan Current plan remains appropriate    Co-evaluation  End of Session Equipment Utilized During Treatment: Gait belt Activity Tolerance: Patient tolerated treatment well Patient left: in chair;with call bell/phone within reach;with chair alarm set     Time: 1114-1130 PT Time Calculation (min) (ACUTE ONLY): 16 min  Charges:  $Therapeutic Activity: 8-22 mins                    G CodesFabio Asa 08/06/2014, 11:35 AM Charlotte Crumb, PT DPT  [phone]

## 2014-08-01 NOTE — H&P (View-Only) (Signed)
  Physical Medicine and Rehabilitation Admission H&P    Chief Complaint  Patient presents with  . Code Stroke  : HPI: [name] is a 59 y.o. right handed male with history of hypertension as well as hyperlipidemia with right MCA territory infarct on 07/25/2014 with single right M2 branch occlusion identified just beyond the bifurcation and occluded right ICA origin. Underwent loop recorder placement per Dr. Taylor and discharge to home on aspirin as well as Plavix 07/27/2014 independent without assistive device. Patient was independent prior to initial admission 07/25/2014 working full time living with his wife. Presented 07/29/2014 with increasing left-sided weakness and slurred speech. MRI of the brain showed acute moderate right middle cerebral artery territory infarct further involvement of the operculum, new involvement of the insula and right basal ganglia propagated from prior imaging. Recent echocardiogram with ejection fraction of 70% grade 1 diastolic dysfunction. Recent venous Doppler studies negative for DVT. Patient did not receive TPA. Remains on aspirin and Plavix therapy per neurology services. Subcutaneous heparin added for DVT prophylaxis. Tolerating a mechanical soft diet. Physical and occupational therapy evaluations completed 07/30/2014 with recommendations of physical medicine rehabilitation consult. Patient was admitted for a comprehensive rehabilitation program  ROS Review of Systems  Constitutional: Negative for fever and chills.  HENT: Negative for hearing loss.  Eyes: Negative for blurred vision and double vision.  Respiratory: Negative for cough and shortness of breath.  Cardiovascular: Negative for leg swelling.  Gastrointestinal: Positive for heartburn, nausea and constipation.  Musculoskeletal: Positive for myalgias.  Skin: Negative for rash.  Neurological: Positive for dizziness and headaches. Negative for tingling and weakness   Past Medical History    Diagnosis Date  . Arthritis   . Hypertension   . Hyperlipidemia    Past Surgical History  Procedure Laterality Date  . Loop recorder implant  07/27/14    LOOP REVEAL LINQ [id number] - [id number]  . Tee without cardioversion N/A 07/27/2014    Procedure: TRANSESOPHAGEAL ECHOCARDIOGRAM (TEE);  Surgeon: Kenneth C Hilty, MD;  Location: [facility];  Service: Cardiovascular;  Laterality: N/A;  . Ep implantable device N/A 07/27/2014    Procedure: Loop Recorder Insertion;  Surgeon: Gregg W Taylor, MD;  Location: [facility]AS[facility];  Service: Cardiovascular;  Laterality: N/A;   Family History  Problem Relation Age of Onset  . Cancer Father   . Heart attack Brother    Social History:  reports that he has been smoking Cigarettes.  He has a 20 pack-year smoking history. He does not have any smokeless tobacco history on file. He reports that he drinks about 10.8 oz of alcohol per week. He reports that he does not use illicit drugs. Allergies: No Known Allergies Medications Prior to Admission  Medication Sig Dispense Refill  . aspirin EC 81 MG EC tablet Take 1 tablet (81 mg total) by mouth daily. 30 tablet 0  . atorvastatin (LIPITOR) 40 MG tablet Take 1 tablet (40 mg total) by mouth daily. 30 tablet 5  . clopidogrel (PLAVIX) 75 MG tablet Take 1 tablet (75 mg total) by mouth daily. 30 tablet 0  . ibuprofen (ADVIL,MOTRIN) 200 MG tablet Take 400 mg by mouth every 6 (six) hours as needed (pain).     . lisinopril-hydrochlorothiazide (PRINZIDE,ZESTORETIC) 20-12.5 MG per tablet Take 1 tablet by mouth daily. 30 tablet 5    Home: Home Living Family/patient expects to be discharged to:: Private residence Living Arrangements: Spouse/significant other, Other (Comment) Available Help at Discharge: Family Type of Home: House Home Access:   Stairs to enter [employer] of Steps: 2 Entrance Stairs-Rails: Right, Left Home Layout: One level Home Equipment: None  Lives With: Spouse, Other (Comment)    Functional History: Prior Function Level of Independence: Independent Comments: works as Aeronautical engineer at [employer] Status:  Mobility: Bed Mobility Overal bed mobility: Needs Assistance Bed Mobility: Sit to Supine Supine to sit: Mod assist Sit to supine: Mod assist General bed mobility comments: VCs for positioning, assist for safety and mobility, patient very impulsive with poor ability to follow comands for return to supine, attempting to lay down sideways towards the left without fully being on the bed Transfers Overall transfer level: Needs assistance Equipment used: 1 person hand held assist Transfers: Sit to/from Stand Sit to Stand: Max assist General transfer comment: Patient significantly impulsive, able to power to standing but required max assist for safety and mobility, patient attempting to launch himself out of chair ov 3 occassions with no recognition of positioning re: left side.  Ambulation/Gait Ambulation/Gait assistance: Max assist Ambulation Distance (Feet): 4 Feet Assistive device: 1 person hand held assist General Gait Details: required full body support with nearly fally, manual assist to hold upright, manual and verbal cues for initiation of step, Patient extremely impulsive and lacks insight and awarenss PP:[id number] left side    ADL: ADL Overall ADL's : Needs assistance/impaired Upper Body Dressing : Maximal assistance, Bed level Upper Body Dressing Details (indicate cue type and reason): pt unaware L arm dressed and attempting to pull at gown again to dress it.  Lower Body Dressing: Maximal assistance General ADL Comments: Pt will require (A) for all adls due to balance deficits, impulsive and L inattention. Pt required additional cues to scan tray to locate phone. Pt required cues to turn head to read clock. Pt only reading the R side of phone screen. Pt unaware of neglect of L side even with cues. Pt repeating statement with no awareness to  error. pt wet on L side and gown changed. Pt unable to feel wet clothing.   Cognition: Cognition Overall Cognitive Status: Impaired/Different from baseline Arousal/Alertness: Awake/alert Orientation Level: Oriented X4 Attention: Sustained, Selective Sustained Attention: Appears intact Selective Attention: Impaired (? behavior component as pt required cues to turn off tv volume to participate in eval) Selective Attention Impairment: Verbal basic, Functional basic Awareness: Impaired Awareness Impairment: Intellectual impairment (decr awareness to attended to left deficits) Problem Solving: Impaired (admitted to need to use call bell for assist - needed assist to locate in bed *was on his left) Problem Solving Impairment: Functional basic Behaviors: Impulsive Cognition Arousal/Alertness: Awake/alert Behavior During Therapy: Impulsive Overall Cognitive Status: Impaired/Different from baseline Area of Impairment: Attention, Following commands, Safety/judgement, Awareness, Problem solving Orientation Level: Disoriented to, Time, Situation Current Attention Level: Sustained Memory: Decreased recall of precautions, Decreased short-term memory Following Commands: Follows one step commands inconsistently, Follows one step commands with increased time Safety/Judgement: Decreased awareness of safety, Decreased awareness of deficits Awareness: Intellectual Problem Solving: Slow processing, Decreased initiation, Difficulty sequencing General Comments: Patient oreiented x3 this session but pt remains with L inattention, impulisve at times, pt demonstrates visual deficits and lack of awareness  Physical Exam: Blood pressure 127/74, pulse 59, temperature 98.1 F (36.7 C), temperature source Oral, resp. rate 20, height 5' 6" (1.676 m), weight 76.658 kg (169 lb), SpO2 100 %. Physical Exam Constitutional: He appears well-developed. More alert HENT:  Dentition fair, mucosa pink and moist Eyes:   Pupils reactive to light  Neck: Normal range of motion.  Neck supple. No thyromegaly present.  Cardiovascular: Normal rate and regular rhythm. no murmurs or rubs Respiratory: Effort normal and breath sounds normal. No respiratory distress. No wheezes GI: Soft. Bowel sounds are normal. He exhibits no distension.  Musculoskeletal: He exhibits no edema or tenderness.  Neurological:  More arousable. Right gaze preference.  New he had a stroke. Fair awareness of deficits. LUE 2 deltoid, 2- bicep, 2- tricep, trace wrist, 0 fingers. LLE: 2+ to 3/5 HF, KE and ADF/APF. Decreased PP and LT left arm and leg is persistent. Left central 7 and tongue deviation. No resting tone. DTR's 3+ on left. Toes up. Skin: Skin is warm and dry.  Psychiatric:  flat    Results for orders placed or performed during the hospital encounter of 07/29/14 (from the past 48 hour(s))  CBG monitoring, ED     Status: Abnormal   Collection Time: 07/29/14  7:42 PM  Result Value Ref Range   Glucose-Capillary 126 (H) 65 - 99 mg/dL   Comment 1 Notify RN   Protime-INR     Status: None   Collection Time: 07/29/14  7:45 PM  Result Value Ref Range   Prothrombin Time 13.5 11.6 - 15.2 seconds   INR 1.01 0.00 - 1.49  APTT     Status: None   Collection Time: 07/29/14  7:45 PM  Result Value Ref Range   aPTT 36 24 - 37 seconds  CBC     Status: Abnormal   Collection Time: 07/29/14  7:45 PM  Result Value Ref Range   WBC 12.0 (H) 4.0 - 10.5 K/uL   RBC 4.80 4.22 - 5.81 MIL/uL   Hemoglobin 15.8 13.0 - 17.0 g/dL   HCT 44.0 39.0 - 52.0 %   MCV 91.7 78.0 - 100.0 fL   MCH 32.9 26.0 - 34.0 pg   MCHC 35.9 30.0 - 36.0 g/dL   RDW 12.3 11.5 - 15.5 %   Platelets 282 150 - 400 K/uL  Differential     Status: Abnormal   Collection Time: 07/29/14  7:45 PM  Result Value Ref Range   Neutrophils Relative % 62 43 - 77 %   Neutro Abs 7.4 1.7 - 7.7 K/uL   Lymphocytes Relative 28 12 - 46 %   Lymphs Abs 3.3 0.7 - 4.0 K/uL   Monocytes Relative 9 3 -  12 %   Monocytes Absolute 1.1 (H) 0.1 - 1.0 K/uL   Eosinophils Relative 1 0 - 5 %   Eosinophils Absolute 0.2 0.0 - 0.7 K/uL   Basophils Relative 0 0 - 1 %   Basophils Absolute 0.1 0.0 - 0.1 K/uL  Comprehensive metabolic panel     Status: Abnormal   Collection Time: 07/29/14  7:45 PM  Result Value Ref Range   Sodium 131 (L) 135 - 145 mmol/L   Potassium 3.8 3.5 - 5.1 mmol/L   Chloride 97 (L) 101 - 111 mmol/L   CO2 20 (L) 22 - 32 mmol/L   Glucose, Bld 121 (H) 65 - 99 mg/dL   BUN 14 6 - 20 mg/dL   Creatinine, Ser 1.30 (H) 0.61 - 1.24 mg/dL   Calcium 9.7 8.9 - 10.3 mg/dL   Total Protein 8.5 (H) 6.5 - 8.1 g/dL   Albumin 4.1 3.5 - 5.0 g/dL   AST 34 15 - 41 U/L   ALT 36 17 - 63 U/L   Alkaline Phosphatase 65 38 - 126 U/L   Total Bilirubin 0.7 0.3 - 1.2 mg/dL   GFR  calc non Af Amer 59 (L) >60 mL/min   GFR calc Af Amer >60 >60 mL/min    Comment: (NOTE) The eGFR has been calculated using the CKD EPI equation. This calculation has not been validated in all clinical situations. eGFR's persistently <60 mL/min signify possible Chronic Kidney Disease.    Anion gap 14 5 - 15  I-stat troponin, ED (not at [facility], [facility])     Status: None   Collection Time: 07/29/14  7:47 PM  Result Value Ref Range   Troponin i, poc 0.00 0.00 - 0.08 ng/mL   Comment 3            Comment: Due to the release kinetics of cTnI, a negative result within the first hours of the onset of symptoms does not rule out myocardial infarction with certainty. If myocardial infarction is still suspected, repeat the [name] at appropriate intervals.   I-Stat Chem 8, ED  (not at [facility], [facility])     Status: Abnormal   Collection Time: 07/29/14  7:49 PM  Result Value Ref Range   Sodium 135 135 - 145 mmol/L   Potassium 3.8 3.5 - 5.1 mmol/L   Chloride 99 (L) 101 - 111 mmol/L   BUN 16 6 - 20 mg/dL   Creatinine, Ser 1.20 0.61 - 1.24 mg/dL   Glucose, Bld 123 (H) 65 - 99 mg/dL   Calcium, Ion 1.15 1.12 - 1.23 mmol/L   TCO2 19 0 - 100 mmol/L    Hemoglobin 17.0 13.0 - 17.0 g/dL   HCT 50.0 39.0 - 52.0 %  I-Stat CG4 Lactic Acid, ED     Status: Abnormal   Collection Time: 07/29/14  7:51 PM  Result Value Ref Range   Lactic Acid, Venous 2.69 (HH) 0.5 - 2.0 mmol/L   Comment NOTIFIED PHYSICIAN   CBG monitoring, ED     Status: Abnormal   Collection Time: 07/29/14  7:54 PM  Result Value Ref Range   Glucose-Capillary 107 (H) 65 - 99 mg/dL  Lactic acid, plasma     Status: None   Collection Time: 07/30/14  1:06 AM  Result Value Ref Range   Lactic Acid, Venous 2.0 0.5 - 2.0 mmol/L  Glucose, capillary     Status: None   Collection Time: 07/30/14  6:07 AM  Result Value Ref Range   Glucose-Capillary 99 65 - 99 mg/dL  Glucose, capillary     Status: None   Collection Time: 07/30/14 10:42 PM  Result Value Ref Range   Glucose-Capillary 96 65 - 99 mg/dL   Comment 1 Notify RN    Comment 2 Document in Chart   Basic metabolic panel     Status: None   Collection Time: 07/31/14  5:45 AM  Result Value Ref Range   Sodium 135 135 - 145 mmol/L   Potassium 4.0 3.5 - 5.1 mmol/L   Chloride 102 101 - 111 mmol/L   CO2 23 22 - 32 mmol/L   Glucose, Bld 94 65 - 99 mg/dL   BUN 12 6 - 20 mg/dL   Creatinine, Ser 0.78 0.61 - 1.24 mg/dL   Calcium 9.2 8.9 - 10.3 mg/dL   GFR calc non Af Amer >60 >60 mL/min   GFR calc Af Amer >60 >60 mL/min    Comment: (NOTE) The eGFR has been calculated using the CKD EPI equation. This calculation has not been validated in all clinical situations. eGFR's persistently <60 mL/min signify possible Chronic Kidney Disease.    Anion gap 10 5 - 15    CBC     Status: None   Collection Time: 07/31/14  5:45 AM  Result Value Ref Range   WBC 9.5 4.0 - 10.5 K/uL   RBC 4.61 4.22 - 5.81 MIL/uL   Hemoglobin 14.7 13.0 - 17.0 g/dL   HCT 42.8 39.0 - 52.0 %   MCV 92.8 78.0 - 100.0 fL   MCH 31.9 26.0 - 34.0 pg   MCHC 34.3 30.0 - 36.0 g/dL   RDW 12.6 11.5 - 15.5 %   Platelets 282 150 - 400 K/uL  Glucose, capillary     Status:  Abnormal   Collection Time: 07/31/14  6:36 AM  Result Value Ref Range   Glucose-Capillary 103 (H) 65 - 99 mg/dL   Comment 1 Notify RN    Comment 2 Document in Chart    Ct Head (brain) Wo Contrast  07/29/2014   CLINICAL DATA:  Code stroke. Left-sided weakness and left facial droop. Confusion and diaphoresis. Recent CVA. Initial encounter.  EXAM: CT HEAD WITHOUT CONTRAST  TECHNIQUE: Contiguous axial images were obtained from the base of the skull through the vertex without intravenous contrast.  COMPARISON:  CT/CTA of the head performed 07/26/2014, and MRI of the brain performed 07/25/2014  FINDINGS: There is an evolving infarct at the anterior right frontal operculum, as previously noted, and at the right insular cortex. The infarct at the insular cortex has increased in size since the prior MRI, involving portions of the right external capsule, with vague superior extension into the subcortical white matter of the right parietal lobe. The known punctate infarct at the high right parietal lobe is not well seen on CT. No definite new infarct is characterized. There is no evidence of intra or extra-axial hemorrhage.  Small chronic lacunar infarcts are noted at the left basal ganglia. A right central 3.3 cm arachnoid cyst is again noted at the posterior fossa.  The brainstem and fourth ventricle are within normal limits. The third and lateral ventricles are unremarkable in appearance. No midline shift is seen.  There is no evidence of fracture; visualized osseous structures are unremarkable in appearance. The orbits are within normal limits. The paranasal sinuses and mastoid air cells are well-aerated. No significant soft tissue abnormalities are seen.  IMPRESSION: 1. No definite new infarct seen at this time. 2. Evolving infarct at the anterior right frontal operculum, as previously noted, and at the right insular cortex. The infarct in the insular cortex has increased in size since the prior MRI, now involving  portions of the right external capsule, with vague superior extension into the subcortical white matter of the right parietal lobe. Known punctate infarct at the high right parietal lobe is not well seen on CT. No evidence of hemorrhagic transformation at this time. 3. Small chronic lacunar infarcts at the left basal ganglia. 4. Right central 3.3 cm arachnoid cyst again seen at the posterior fossa.  These results were called by telephone at the time of interpretation on 07/29/2014 at 8:03 pm to Dr. Armida Sans, who verbally acknowledged these results.   Electronically Signed   By: Garald Balding M.D.   On: 07/29/2014 20:05   Mr Brain Wo Contrast  07/29/2014   ADDENDUM REPORT: 07/29/2014 23:32  ADDENDUM: Acute findings discussed with and reconfirmed by Dr.OSVALDO CAMILO on 07/29/2014 at 11:30 pm.   Electronically Signed   By: Elon Alas M.D.   On: 07/29/2014 23:32   07/29/2014   CLINICAL DATA:  LEFT-sided weakness beginning today, slurred speech and facial droop. Similar  symptoms to prior stroke, however extremity weakness is new. History of hypertension and hyperlipidemia.  EXAM: MRI HEAD WITHOUT CONTRAST  TECHNIQUE: Multiplanar, multiecho pulse sequences of the brain and surrounding structures were obtained without intravenous contrast.  COMPARISON:  CT head July 29, 2014 and MRI of the brain July 25, 2014  FINDINGS: Reduced diffusion within RIGHT posterior insula, new from prior examination, with mild reduced diffusion within the RIGHT lenticulostriate nucleus. Patchy reduced diffusion in the posterior RIGHT frontal lobe/operculum, new from prior examination. These areas show low ADC values. Reduced diffusion within RIGHT frontal operculum, with normalized ADC values consistent with acute to subacute infarct. No susceptibility artifact to suggest hemorrhage.  Ventricles and sulci are overall normal for patient's age. Scattered subcentimeter supratentorial white matter FLAIR T2 hyperintensities exclusive of the  aforementioned on abnormality consistent with chronic small vessel ischemic disease. No midline shift. No significant mass effect.  Posterior fossa arachnoid cyst again noted. No abnormal extra-axial fluid collections. Loss of RIGHT internal carotid artery flow related enhancement again noted, however, poor RIGHT middle cerebral artery flow related enhancement, which appears new from prior imaging.  Ocular globes and orbital contents are unremarkable. Paranasal sinuses are well aerated. Trace mastoid effusions. Mild temporomandibular osteoarthrosis. No abnormal sellar expansion. No cerebellar tonsillar ectopia. No suspicious calvarial bone marrow signal.  IMPRESSION: Acute moderate RIGHT middle cerebral artery territory infarct (further involvement of the operculum, new involvement of the insula and RIGHT basal ganglia), propagated from prior imaging. No hemorrhagic conversion.  Suspected occluded RIGHT internal carotid artery, with slow flow versus occluded RIGHT middle cerebral artery.  Electronically Signed: By: Elon Alas M.D. On: 07/29/2014 23:28       Medical Problem List and Plan: 1. Functional deficits secondary to extension right MCA infarct embolic from right ICA occlusion 2.  DVT Prophylaxis/Anticoagulation: Subcutaneous heparin. Monitor platelet counts of any signs of bleeding 3. Pain Management: Tylenol as needed 4. Tobacco abuse. Continue Nicoderm patch. Provide counseling 5. Neuropsych: This patient is capable of making decisions on his own behalf. 6. Skin/Wound Care: Routine skin checks 7. Fluids/Electrolytes/Nutrition: Routine I&O is with follow-up chemistries 8. Hyperlipidemia. Lipitor 9. Hypertension. No present antihypertensives medication. Patient on Prinzide/Zestoretic 20-12.5 mg daily prior to admission. Resume as needed    Post Admission Physician Evaluation: 1. Functional deficits secondary  to right MCA embolic infarct from right ICA occlusion. 2. Patient is  admitted to receive collaborative, interdisciplinary care between the physiatrist, rehab nursing staff, and therapy team. 3. Patient's level of medical complexity and substantial therapy needs in context of that medical necessity cannot be provided at a lesser intensity of care such as a SNF. 4. Patient has experienced substantial functional loss from his/her baseline which was documented above under the "Functional History" and "Functional Status" headings.  Judging by the patient's diagnosis, physical exam, and functional history, the patient has potential for functional progress which will result in measurable gains while on inpatient rehab.  These gains will be of substantial and practical use upon discharge  in facilitating mobility and self-care at the household level. 5. Physiatrist will provide 24 hour management of medical needs as well as oversight of the therapy plan/treatment and provide guidance as appropriate regarding the interaction of the two. 6. 24 hour rehab nursing will assist with bladder management, bowel management, safety, skin/wound care, disease management, medication administration, pain management and patient education  and help integrate therapy concepts, techniques,education, etc. 7. PT will assess and treat for/with: Lower extremity strength, range of motion, stamina,  balance, functional mobility, safety, adaptive techniques and equipment, NMR, visual-perceptual and cognitive perceptual awareness, pain control, family education, ego support.   Goals are: supervision. 8. OT will assess and treat for/with: ADL's, functional mobility, safety, upper extremity strength, adaptive techniques and equipment, NMR, cognitive perceptual and visual perceptual rx, family education, ego support.   Goals are: supervision to min assist. Therapy may proceed with showering this patient. 9. SLP will assess and treat for/with: cognition, swallowing, communication.  Goals are: mod I to  supervision. 10. Case Management and Social Worker will assess and treat for psychological issues and discharge planning. 11. Team conference will be held weekly to assess progress toward goals and to determine barriers to discharge. 12. Patient will receive at least 3 hours of therapy per day at least 5 days per week. 13. ELOS: 15-18 days       14. Prognosis:  excellent     Meredith Staggers, MD, [facility] 08/01/2014   07/31/2014

## 2014-08-01 NOTE — Discharge Instructions (Signed)
You will continue to get therapy sessions in [facility].  Your atorvastatin dose was increased to 80mg  daily.  We are holding your blood pressure medications to make sure that your BP doesn't get too low.

## 2014-08-01 NOTE — Progress Notes (Signed)
Report called to [facility], RN [phone] receiving nurse. Pt transferred out by bed awake, alert, oriented, and conversant.

## 2014-08-01 NOTE — Progress Notes (Signed)
Inpatient Rehabilitation  I have insurance authorization for admitting pt. to CIR , and have discussed with and received MD clearance for admission today.  Pt., wife and mom aware and very pleased .  I have notified pt's RN as well as Elmer Bales, CM and Dysheka Bibbs, SW.  Pt. Initially stated he has not had BM since 07/25/14 then later reported he had BM 07/29/14. Chart shows last BM 07/29/14 but RN to address bowel needs with pt. And MD.  I will arrange for admission later today.  Please call if questions.  Weldon Picking PT Inpatient Rehab Admissions Coordinator Cell [phone] Office [phone]

## 2014-08-01 NOTE — Progress Notes (Signed)
Family Medicine Teaching Service Daily Progress Note Intern Pager: [phone]  Patient name: [name] Medical record number: [id number] Date of birth: 12-06-1955 Age: 59 y.o. Gender: male  Primary Care Provider: Dow Adolph, MD Consultants: Neurology, CIR Code Status: Full  Pt Overview and Major Events to Date:  6/12: Trouble with mobility, quiet, L facial droop, slurred speech, L arm paresis  Assessment and Plan: [name] is a 59 y.o. male presenting with concern for acute stroke. PMH is significant for CVA, HTN, HLD, Tobacco abuse, EtOH abuse  Left-sided hemiparesis: this is a new finding from recent stroke (R MCA infract). Extension of previous stroke. Patient had a loop recorder implanted during last hospitalization however no events. Found to have 100% occlusion of right internal carotid on CTA previous admission. TEE on previous admission without evidence of atrial thrombus or vegetations. Because of multiple areas of infarction, concern for embolism likely from ICA. Hemoglobin A1c of 6. LDL of 97. MRI revealed propagation of the R MCA infract into the operculum with new involvement of the insula and RIGHT basal ganglia. - continue to monitor on telemetry  - Neurology following, appreciate recommendations - SLP evaluation: dysphagia 3 diet - continue statin (increased to  daily), Plavix/ASA - PT/OT: CIR - will await PM&R evaluation and insurance approval  Hypertension: normotensive   - hold lisinopril-hctz, would consider holding this on discharge if BPs stay stable given concerns for hypotension with re-initiation of these on previous discharge. - ok to restart per neuro - follow blood pressures  Tobacco abuse: patient quit smoking since previous admission - nicotine patch  daily  Elevated lactic acid: Resolved. initially elevated to 2.69 on admission. Patient currently stable. Normotensive. Possibly related to stroke. Improved to 2.0.  FEN/GI: Dysphagia 3,  SLIV Pophylaxis: heparin subq  Disposition: Most likely CIR pending evaluation, otherwise do not want SNF, would go home with [facility]  Subjective:  Patient without pain. Movement in LUE and LLE improving,.  Looking forward to possibly going to CIR.   Objective: Temp:  [97.9 F (36.6 C)-99 F (37.2 C)] 98.3 F (36.8 C) (06/15 0540) Pulse Rate:  [57-73] 61 (06/15 0540) Resp:  [20] 20 (06/15 0540) BP: (113-129)/(46-83) 129/72 mmHg (06/15 0540) SpO2:  [96 %-100 %] 97 % (06/15 0540) Physical Exam: General: Lying in bed.  Cardiovascular: RRR, no m/r/g noted. No pitting edema Respiratory: Normal WOB. CTAB. No w/r/c. Abdomen: +BS, soft, ND/NT Extremities: No gross deformities  Neuro: A&O x 4. EOMI. PERRL. L sided facial droop most notable in the lips with smile, not promienet in the forehead. Dysarthria improved.  Decreased sensation to touch in the V1-V3 distribution on the L. 3/5 strength at elbow of L arm, otherwise unable to move. 5/5 strength in the R UE. 4/5 strength in the L LE, 5/5 on the R. Decreased sensation on the L UE, as well as decreased sensation over the L LE.  Laboratory:  Recent Labs Lab 07/29/14 1945 07/29/14 1949 07/31/14 0545 08/01/14 0416  WBC 12.0*  --  9.5 10.3  HGB 15.8 17.0 14.7 14.8  HCT 44.0 50.0 42.8 42.2  PLT 282  --  282 269    Recent Labs Lab 07/25/14 1531  07/29/14 1945 07/29/14 1949 07/31/14 0545 08/01/14 0416  NA 133*  < > 131* 135 135 135  K 3.9  < > 3.8 3.8 4.0 4.3  CL 101  < > 97* 99* 102 104  CO2 24  --  20*  --  23 21*  BUN 19  < >  14 16 12 12   CREATININE 0.95  < > 1.30* 1.20 0.78 0.87  CALCIUM 9.7  --  9.7  --  9.2 9.4  PROT 7.4  --  8.5*  --   --   --   BILITOT 0.6  --  0.7  --   --   --   ALKPHOS 62  --  65  --   --   --   ALT 33  --  36  --   --   --   AST 26  --  34  --   --   --   GLUCOSE 87  < > 121* 123* 94 81  < > = values in this interval not displayed. Risk Stratification Labs  TSH    Component Value Date/Time    TSH 3.313 07/31/2014 1150   TSH 3.239 10/10/2012 1012   Hemoglobin A1C    Component Value Date/Time   HGBA1C 6.0* 07/26/2014 0451   Lipid Panel     Component Value Date/Time   CHOL 192 07/26/2014 0437   TRIG 305* 07/26/2014 0437   HDL 34* 07/26/2014 0437   CHOLHDL 5.6 07/26/2014 0437   VLDL 61* 07/26/2014 0437   LDLCALC 97 07/26/2014 0437      Imaging/Diagnostic Tests: CT HEAD WITHOUT CONTRAST 07/29/2014  1. No definite new infarct seen at this time. 2. Evolving infarct at the anterior right frontal operculum, as previously noted, and at the right insular cortex. The infarct in the insular cortex has increased in size since the prior MRI, now involving portions of the right external capsule, with vague superior extension into the subcortical white matter of the right parietal lobe. Known punctate infarct at the high right parietal lobe is not well seen on CT. No evidence of hemorrhagic transformation at this time. 3. Small chronic lacunar infarcts at the left basal ganglia. 4. Right central 3.3 cm arachnoid cyst again seen at the posterior fossa  MRI: Acute moderate RIGHT middle cerebral artery territory infarct (further involvement of the operculum, new involvement of the insula and RIGHT basal ganglia), propagated from prior imaging. No hemorrhagic conversion.Suspected occluded RIGHT internal carotid artery, with slow flow versus occluded RIGHT middle cerebral artery.   Erasmo Downer, MD 08/01/2014, 7:08 AM PGY-1, [facility] FPTS Intern pager: [phone], text pages welcome

## 2014-08-01 NOTE — Progress Notes (Signed)
Weldon Picking Rehab Admission Coordinator Signed Physical Medicine and Rehabilitation PMR Pre-admission 08/01/2014 11:09 AM  Related encounter: ED to Hosp-Admission (Current) from 07/29/2014 in [facility]    Expand All Collapse All   PMR Admission Coordinator Pre-Admission Assessment  Patient: [name] is an 59 y.o., male MRN: [id number] DOB: 1955-07-19 Height: 5\' 6"  (167.6 cm) Weight: 76.658 kg (169 lb)  Insurance Information HMO: PPO: PCP: IPA: 80/20: OTHER: Blue Select Plan PRIMARY: BC[place] Policy#: [id number] Subscriber: self CM Name: Virl Diamond Phone#: [phone] Fax#: [phone] Pre-Cert#: [id number]; authorized for admission 08/01/14 thru 08/10/14 with updates due 08/09/14 Employer: [employer] Benefits: Phone #: [phone] Name: Sharren Bridge. Date: 02/16/14 Deduct: $1750 Out of Pocket Max: $3500 Life Max: none CIR: 70%30% SNF: 70%/30% Outpatient: 70%/30% Co-Pay: $0 copay, 60 visit max Home Health: 70%/30% Co-Pay:  DME: 70%/30% Co-Pay:  Providers: In network SECONDARY: Policy#: Subscriber:  CM Name: Phone#: Fax#:  Pre-Cert#: Employer:  Benefits: Phone #: Name:  Eff. Date: Deduct: Out of Pocket Max: Life Max:  CIR: SNF:  Outpatient: Co-Pay:  Home Health: Co-Pay:  DME: Co-Pay:   Medicaid Application Date: Case Manager:  Disability Application Date: Case Worker:   Emergency Contact Information Contact Information    Name Relation Home Work Mobile   [name],[name] [phone]  [phone]   [name],[name] Mother   [phone]   [name],[name] Daughter   [phone]      Current Medical History  Patient Admitting Diagnosis: extension of right MCA infarct (embolic from Right ICA) History of Present Illness: [name] is a 59 y.o. right handed male with history of hypertension as well as hyperlipidemia with right MCA territory infarct on 07/25/2014 with single right M2 branch occlusion identified just beyond the bifurcation and occluded right ICA origin. Underwent loop recorder placement per Dr. Ladona Ridgel and discharge to home on aspirin as well as Plavix 07/27/2014 independent without assistive device. Patient was independent prior to initial admission 07/25/2014 working full time living with his wife. Presented 07/29/2014 with increasing left-sided weakness and slurred speech. MRI of the brain showed acute moderate right middle cerebral artery territory infarct further involvement of the operculum, new involvement of the insula and right basal ganglia propagated from prior imaging. Recent echocardiogram with ejection fraction of 70% grade 1 diastolic dysfunction. Recent venous Doppler studies negative for DVT. Patient did not receive TPA. Remains on aspirin and Plavix therapy per neurology services. Subcutaneous heparin added for DVT prophylaxis. Tolerating a mechanical soft diet. Physical and occupational therapy evaluations completed 07/30/2014 with recommendations of physical medicine rehabilitation consult. Patient was admitted for a comprehensive rehabilitation program Total: 9 NIH    Past Medical History  Past Medical History  Diagnosis Date  . Arthritis   . Hypertension   . Hyperlipidemia     Family History  family history includes Cancer in his father; Heart attack in his brother.  Prior Rehab/Hospitalizations:  Has the patient had major surgery during 100 days prior to admission? No  Current Medications   Current facility-administered medications:  . acetaminophen (TYLENOL) tablet 650 mg, 650 mg, Oral, Q6H PRN, Erasmo Downer, MD, 650 mg at 08/01/14 1035 . aspirin EC tablet 81 mg, 81 mg, Oral, Daily, Narda Bonds, MD, 81 mg at 08/01/14 1034 . atorvastatin (LIPITOR) tablet 80 mg, 80 mg, Oral, q1800, Joanna Puff, MD, 80 mg at 07/31/14 1834 . clopidogrel (PLAVIX) tablet 75 mg, 75 mg, Oral, Daily, Narda Bonds, MD, 75 mg at 08/01/14 1034 .  heparin injection 5,000 Units, 5,000 Units, Subcutaneous, 3 times per day, Narda Bonds, MD, 5,000 Units at 08/01/14 0602 . nicotine (NICODERM CQ - dosed in mg/24 hours) patch 14 mg, 14 mg, Transdermal, Daily, Ashly M Gottschalk, DO, 14 mg at 08/01/14 1035 . polyethylene glycol (MIRALAX / GLYCOLAX) packet 17 g, 17 g, Oral, Daily, Ashly M Gottschalk, DO . 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 Pt. independent and drives PTA.  Home Assistive Devices / Equipment Home Assistive Devices/Equipment: Cane (specify quad or straight), Walker (specify type) Home Equipment: None  Prior Device Use: Indicate devices/aids used by the patient prior to current illness, exacerbation or injury? None of the above  Prior Functional Level Prior Function Level of Independence: Independent Comments: works as Merchandiser, retail at [employer] Care: Did the patient need help bathing, dressing, using the toilet or eating? Independent  Indoor Mobility: Did the patient need assistance with walking from room to room (with or without device)? Independent  Stairs: Did the patient need assistance with internal or external stairs  (with or without device)? Independent  Functional Cognition: Did the patient need help planning regular tasks such as shopping or remembering to take medications? Independent  Current Functional Level Cognition  Arousal/Alertness: Awake/alert Overall Cognitive Status: Impaired/Different from baseline Current Attention Level: Sustained Orientation Level: Oriented X4 Following Commands: Follows one step commands inconsistently, Follows one step commands with increased time Safety/Judgement: Decreased awareness of safety, Decreased awareness of deficits General Comments: improvements in impulsivity and awareness noted this session compared to evaluation Attention: Sustained, Selective Sustained Attention: Appears intact Selective Attention: Impaired (? behavior component as pt required cues to turn off tv volume to participate in eval) Selective Attention Impairment: Verbal basic, Functional basic Awareness: Impaired Awareness Impairment: Intellectual impairment (decr awareness to attended to left deficits) Problem Solving: Impaired (admitted to need to use call bell for assist - needed assist to locate in bed *was on his left) Problem Solving Impairment: Functional basic Behaviors: Impulsive   Extremity Assessment (includes Sensation/Coordination)  Upper Extremity Assessment: LUE deficits/detail LUE Deficits / Details: tone noted, muscle belly tapping in supine able to activate tricep x2, no active movement in L hand noted LUE Sensation: decreased light touch, decreased proprioception LUE Coordination: decreased fine motor, decreased gross motor  Lower Extremity Assessment: LLE deficits/detail LLE Deficits / Details: weakness noted, 3-/5 hip flexion, knee extension, 3/5 dorsiflexion LLE Sensation: decreased light touch LLE Coordination: decreased fine motor, decreased gross motor    ADLs  Overall ADL's : Needs assistance/impaired Upper Body Dressing : Maximal assistance, Bed  level Upper Body Dressing Details (indicate cue type and reason): pt unaware L arm dressed and attempting to pull at gown again to dress it.  Lower Body Dressing: Maximal assistance General ADL Comments: Pt will require (A) for all adls due to balance deficits, impulsive and L inattention. Pt required additional cues to scan tray to locate phone. Pt required cues to turn head to read clock. Pt only reading the R side of phone screen. Pt unaware of neglect of L side even  with cues. Pt repeating statement with no awareness to error. pt wet on L side and gown changed. Pt unable to feel wet clothing.     Mobility  Overal bed mobility: Needs Assistance Bed Mobility: Rolling, Sidelying to Sit Rolling: Min assist Sidelying to sit: Mod assist Supine to sit: Mod assist Sit to supine: Mod assist General bed mobility comments: VCs for positioning with rolling and sidelying to sit technique, assist to initiate and power to upright    Transfers  Overall transfer level: Needs assistance Equipment used: Rolling walker (2 wheeled) Transfers: Sit to/from Stand Sit to Stand: Min assist General transfer comment: Assist to manual attend to left hand on RW. VCs for safety with elevation to standing and controlling descent to chair    Ambulation / Gait / Stairs / Wheelchair Mobility  Ambulation/Gait Ambulation/Gait assistance: Mod assist Ambulation Distance (Feet): 30 Feet Assistive device: Rolling walker (2 wheeled) General Gait Details: improvements noted in awareness of LUE for RW use, able to maintain lose grip on RW today. VCs for patient to open eyes during mobility    Posture / Balance Balance Overall balance assessment: Needs assistance Sitting-balance support: Single extremity supported Sitting balance-Leahy Scale: Fair Postural control: Posterior lean, Left lateral lean Standing balance support: Bilateral upper extremity supported Standing balance-Leahy Scale: Poor    Special  needs/care consideration BiPAP/CPAP no  Oxygen no Special Bed no  Skin Intact per pt. And family  Bowel mgmt: last BM 07/29/14 ; I advised RN of this and she was to address prior to admission to CIR later today Bladder mgmt: continent, using urinal Diabetic mgmt no     Previous Home Environment Living Arrangements: [name]/significant other, Other (Comment) Lives With: [name], Other (Comment) Available Help at Discharge: Family Type of Home: House Home Layout: One level Home Access: Stairs to enter Entrance Stairs-Rails: Right, Left Entrance Stairs-Number of Steps: 2 Bathroom Shower/Tub: Tub/shower unit, Engineer, building services: Standard Home Care Services: No  Discharge Living Setting Plans for Discharge Living Setting: Patient's home Type of Home at Discharge: House Discharge Home Layout: One level Discharge Home Access: Stairs to enter Entrance Stairs-Rails: Right, Left, Can reach both Entrance Stairs-Number of Steps: 2 Discharge Bathroom Shower/Tub: Tub/shower unit Discharge Bathroom Toilet: Standard Discharge Bathroom Accessibility: Yes How Accessible: Accessible via walker Does the patient have any problems obtaining your medications?: No  Social/Family/Support Systems Patient Roles: [name], Parent Anticipated Caregiver: wife, Aniruddh Ciavarella; daughter Yi Haugan (has 2 yound sons, not employed); pt's Mom [employer]. Wife reportedly has a "rare brain disorder" and is on disability. She is quite emotionally labile. Ability/Limitations of Caregiver: family will be able to provide 24 hour supervision and minimal assist. Caregiver Availability: 24/7 Discharge Plan Discussed with Primary Caregiver: Yes Is Caregiver In Agreement with Plan?: Yes Does Caregiver/Family have Issues with Lodging/Transportation while Pt is in Rehab?: No   Goals/Additional Needs Patient/Family Goal for Rehab: supervision and min assist for  PT/OT; supervision for SLP Expected length of stay: 14-18 days Cultural Considerations: none Equipment Needs: TBD Additional Information: Per pt. and his mother, pt's wife Jan has a "rare brain disorder" and is on disability. She has memory issues and notable emotional lability. Pt/Family Agrees to Admission and willing to participate: Yes Program Orientation Provided & Reviewed with Pt/Caregiver Including Roles & Responsibilities: Yes Additional Information Needs: Wife write copious notes to compensate for her memory issues   Decrease burden of Care through IP rehab admission:    Possible need for SNF placement upon discharge: Not  anticipated   Patient Condition: The patient's condition remains as documented in the consult dated 07/31/14, in which the Rehabilitation Physician determined and documented that the patient's condition is appropriate for intensive rehabilitative care in an inpatient rehabilitation facility. Will admit to inpatient rehab today.  Preadmission Screen Completed By: Weldon Picking, 08/01/2014 11:36 AM ______________________________________________________________________  Discussed status with Dr. Riley Kill on 08/01/14 at 1136 and received telephone approval for admission today.  Admission Coordinator: Weldon Picking, time 1136 Dorna Bloom 08/01/14          Cosigned by: Ranelle Oyster, MD at 08/01/2014 11:55 AM

## 2014-08-02 ENCOUNTER — Inpatient Hospital Stay (HOSPITAL_COMMUNITY): Payer: BLUE CROSS/BLUE SHIELD | Admitting: Occupational Therapy

## 2014-08-02 ENCOUNTER — Inpatient Hospital Stay (HOSPITAL_COMMUNITY): Payer: BLUE CROSS/BLUE SHIELD | Admitting: Speech Pathology

## 2014-08-02 ENCOUNTER — Inpatient Hospital Stay (HOSPITAL_COMMUNITY): Payer: BLUE CROSS/BLUE SHIELD

## 2014-08-02 DIAGNOSIS — I10 Essential (primary) hypertension: Secondary | ICD-10-CM

## 2014-08-02 DIAGNOSIS — I63511 Cerebral infarction due to unspecified occlusion or stenosis of right middle cerebral artery: Secondary | ICD-10-CM

## 2014-08-02 LAB — COMPREHENSIVE METABOLIC PANEL
ALBUMIN: 3.5 g/dL (ref 3.5–5.0)
ALK PHOS: 71 U/L (ref 38–126)
ALT: 26 U/L (ref 17–63)
AST: 26 U/L (ref 15–41)
Anion gap: 10 (ref 5–15)
BUN: 10 mg/dL (ref 6–20)
CALCIUM: 9.2 mg/dL (ref 8.9–10.3)
CO2: 24 mmol/L (ref 22–32)
CREATININE: 0.77 mg/dL (ref 0.61–1.24)
Chloride: 101 mmol/L (ref 101–111)
GFR calc non Af Amer: >60 mL/min (ref >60)
Glucose, Bld: 98 mg/dL (ref 65–99)
POTASSIUM: 4 mmol/L (ref 3.5–5.1)
Sodium: 135 mmol/L (ref 135–145)
TOTAL PROTEIN: 7.6 g/dL (ref 6.5–8.1)
Total Bilirubin: 1 mg/dL (ref 0.3–1.2)

## 2014-08-02 LAB — CBC WITH DIFFERENTIAL/PLATELET
Basophils Absolute: 0 10*3/uL (ref 0.0–0.1)
Basophils Relative: 0 % (ref 0–1)
EOS ABS: 0.2 10*3/uL (ref 0.0–0.7)
Eosinophils Relative: 2 % (ref 0–5)
HCT: 41.8 % (ref 39.0–52.0)
HEMOGLOBIN: 14.6 g/dL (ref 13.0–17.0)
LYMPHS PCT: 31 % (ref 12–46)
Lymphs Abs: 3 10*3/uL (ref 0.7–4.0)
MCH: 32.1 pg (ref 26.0–34.0)
MCHC: 34.9 g/dL (ref 30.0–36.0)
MCV: 91.9 fL (ref 78.0–100.0)
Monocytes Absolute: 1.1 10*3/uL — ABNORMAL HIGH (ref 0.1–1.0)
Monocytes Relative: 11 % (ref 3–12)
NEUTROS ABS: 5.4 10*3/uL (ref 1.7–7.7)
Neutrophils Relative %: 56 % (ref 43–77)
Platelets: 292 10*3/uL (ref 150–400)
RBC: 4.55 MIL/uL (ref 4.22–5.81)
RDW: 12.3 % (ref 11.5–15.5)
WBC: 9.7 10*3/uL (ref 4.0–10.5)

## 2014-08-02 LAB — GLUCOSE, CAPILLARY
GLUCOSE-CAPILLARY: 109 mg/dL — AB (ref 65–99)
GLUCOSE-CAPILLARY: 131 mg/dL — AB (ref 65–99)
GLUCOSE-CAPILLARY: 96 mg/dL (ref 65–99)
Glucose-Capillary: 150 mg/dL — ABNORMAL HIGH (ref 65–99)

## 2014-08-02 MED ORDER — POLYVINYL ALCOHOL 1.4 % OP SOLN
2.0000 [drp] | OPHTHALMIC | Status: DC | PRN
  Filled 2014-08-02: qty 15

## 2014-08-02 NOTE — Progress Notes (Addendum)
59 y.o. right handed male with history of hypertension as well as hyperlipidemia with right MCA territory infarct on 07/25/2014 with single right M2 branch occlusion identified just beyond the bifurcation and occluded right ICA origin. Underwent loop recorder placement per Dr. Lovena Le and discharge to home on aspirin as well as Plavix 07/27/2014 independent without assistive device. Patient was independent prior to initial admission 07/25/2014 working full time living with his wife. Presented 07/29/2014 with increasing left-sided weakness and slurred speech. MRI of the brain showed acute moderate right middle cerebral artery territory infarct further involvement of the operculum, new involvement of the insula and right basal ganglia propagated from prior imaging. Recent echocardiogram with ejection fraction of 78% grade 1 diastolic dysfunction  Subjective/Complaints: Pt slept well per wife woke up once to void ROS cannot obtain due to lethargy   Objective: Vital Signs: Blood pressure 151/86, pulse 70, temperature 98.1 F (36.7 C), temperature source Oral, resp. rate 20, SpO2 97 %. No results found. Results for orders placed or performed during the hospital encounter of 08/01/14 (from the past 72 hour(s))  Glucose, capillary     Status: None   Collection Time: 08/01/14  5:09 PM  Result Value Ref Range   Glucose-Capillary 83 65 - 99 mg/dL   Comment 1 Notify RN   Glucose, capillary     Status: Abnormal   Collection Time: 08/01/14  9:00 PM  Result Value Ref Range   Glucose-Capillary 126 (H) 65 - 99 mg/dL   Comment 1 Notify RN   CBC WITH DIFFERENTIAL     Status: Abnormal   Collection Time: 08/02/14  6:36 AM  Result Value Ref Range   WBC 9.7 4.0 - 10.5 K/uL   RBC 4.55 4.22 - 5.81 MIL/uL   Hemoglobin 14.6 13.0 - 17.0 g/dL   HCT 41.8 39.0 - 52.0 %   MCV 91.9 78.0 - 100.0 fL   MCH 32.1 26.0 - 34.0 pg   MCHC 34.9 30.0 - 36.0 g/dL   RDW 12.3 11.5 - 15.5 %   Platelets 292 150 - 400 K/uL   Neutrophils Relative % 56 43 - 77 %   Neutro Abs 5.4 1.7 - 7.7 K/uL   Lymphocytes Relative 31 12 - 46 %   Lymphs Abs 3.0 0.7 - 4.0 K/uL   Monocytes Relative 11 3 - 12 %   Monocytes Absolute 1.1 (H) 0.1 - 1.0 K/uL   Eosinophils Relative 2 0 - 5 %   Eosinophils Absolute 0.2 0.0 - 0.7 K/uL   Basophils Relative 0 0 - 1 %   Basophils Absolute 0.0 0.0 - 0.1 K/uL  Comprehensive metabolic panel     Status: None   Collection Time: 08/02/14  6:36 AM  Result Value Ref Range   Sodium 135 135 - 145 mmol/L   Potassium 4.0 3.5 - 5.1 mmol/L   Chloride 101 101 - 111 mmol/L   CO2 24 22 - 32 mmol/L   Glucose, Bld 98 65 - 99 mg/dL   BUN 10 6 - 20 mg/dL   Creatinine, Ser 0.77 0.61 - 1.24 mg/dL   Calcium 9.2 8.9 - 10.3 mg/dL   Total Protein 7.6 6.5 - 8.1 g/dL   Albumin 3.5 3.5 - 5.0 g/dL   AST 26 15 - 41 U/L   ALT 26 17 - 63 U/L   Alkaline Phosphatase 71 38 - 126 U/L   Total Bilirubin 1.0 0.3 - 1.2 mg/dL   GFR calc non Af Amer >60 >60 mL/min  GFR calc Af Amer >60 >60 mL/min    Comment: (NOTE) The eGFR has been calculated using the CKD EPI equation. This calculation has not been validated in all clinical situations. eGFR's persistently <60 mL/min signify possible Chronic Kidney Disease.    Anion gap 10 5 - 15  Glucose, capillary     Status: Abnormal   Collection Time: 08/02/14  7:01 AM  Result Value Ref Range   Glucose-Capillary 109 (H) 65 - 99 mg/dL   Comment 1 Notify RN      HEENT: normal Cardio: RRR Resp: CTA B/L and unlabored GI: BS positive Extremity:  Pulses positive and No Edema Skin:   Intact and Other IV site ok Neuro: Lethargic, Abnormal Sensory reduced sensation to pinch in LUE, Abnormal Motor 2- Left Bi, tri, 0/5 in fingers and wrist, 3- in LLE and Dysarthric Musc/Skel:  Other no pain with UE or LE ROM Gen NAD   Assessment/Plan: 1. Functional deficits secondary to RIght MCA infarct with left hemiparesis which require 3+ hours per day of interdisciplinary therapy in a  comprehensive inpatient rehab setting. Physiatrist is providing close team supervision and 24 hour management of active medical problems listed below. Physiatrist and rehab team continue to assess barriers to discharge/monitor patient progress toward functional and medical goals. Reviewed labs-wnl Discussed Rehab med services with pt wife and mom, also assessment process and care team FIM:                   Comprehension Comprehension Mode: Auditory Comprehension: 5-Understands complex 90% of the time/Cues < 10% of the time  Expression Expression: 5-Expresses complex 90% of the time/cues < 10% of the time  Social Interaction Social Interaction: 6-Interacts appropriately with others with medication or extra time (anti-anxiety, antidepressant).  Problem Solving Problem Solving Mode: Asleep  Memory Memory: 6-More than reasonable amt of time  Medical Problem List and Plan: 1. Functional deficits secondary to extension right MCA infarct embolic from right ICA occlusion 2.  DVT Prophylaxis/Anticoagulation: Subcutaneous heparin. Monitor platelet counts of any signs of bleeding 3. Pain Management: Tylenol as needed 4. Tobacco abuse. Continue Nicoderm patch. Provide counseling 5. Neuropsych: This patient is capable of making decisions on his own behalf. 6. Skin/Wound Care: Routine skin checks 7. Fluids/Electrolytes/Nutrition: Routine I&O is with follow-up chemistries 8. Hyperlipidemia. Lipitor 9. Hypertension. No present antihypertensives medication. Patient on Prinzide/Zestoretic 20-12.5 mg daily prior to admission. Resume as needed   LOS (Days) 1 A FACE TO FACE EVALUATION WAS PERFORMED  KIRSTEINS,ANDREW E 08/02/2014, 8:20 AM

## 2014-08-02 NOTE — Progress Notes (Signed)
Occupational Therapy Session Note  Patient Details  Name: [name] MRN: [id number] Date of Birth: Jun 22, 1955  Today's Date: 08/02/2014 OT Individual Time: 1530-1600 OT Individual Time Calculation (min): 30 min    Short Term Goals: Week 1:     Skilled Therapeutic Interventions/Progress Updates:    Pt seen for 1:1 OT session with a focus on functional transfers, sit<>stand, standing balance, LUE ROM, safety awareness, and activity tolerance. Pt received seated in w/c agreeable to therapy. Noted LUE positioned btwn body and w/c arm due to decreased L side  inattention. Therapist provided cushion and half lap tray for optimal positioning in wheelchair. Pt engaged in functional toilet transfer with min A and max cues for safety. Pt the participated in 5 sit<>stand with steadying A and mod cues for safety. Therapist facilitated gentle ROM of LUE. Pt demonstrated AROM in shoulder, biceps, triceps. Pt demonstrated mod-max difficulty with RW negotiation from w/c<bed. Overall pt demonstrated max impulsivity during transitional movements. Pt left supine in bed with all needs in reach.   Therapy Documentation Precautions:  Precautions Precautions: Fall Precaution Comments: impulsive; L neglect Restrictions Weight Bearing Restrictions: No General:   Vital Signs: Therapy Vitals Temp: 98.1 F (36.7 C) Temp Source: Oral Pulse Rate: 66 Resp: 20 BP: 111/66 mmHg Patient Position (if appropriate): Sitting Oxygen Therapy SpO2: 100 % O2 Device: Not Delivered Pain: Pain Assessment Pain Assessment: No/denies pain ADL:   Exercises:   Other Treatments:    See FIM for current functional status  Therapy/Group: Individual Therapy  Alger Memos 08/02/2014, 4:16 PM

## 2014-08-02 NOTE — Progress Notes (Signed)
Patient information reviewed and entered into [facility] system by Devin Ganaway, RN, CRRN, PPS Coordinator.  Information including medical coding and functional independence measure will be reviewed and updated through discharge.    

## 2014-08-02 NOTE — Evaluation (Signed)
Physical Therapy Assessment and Plan  Patient Details  Name: [name] MRN: [id number] Date of Birth: [birth year]  PT Diagnosis: Abnormality of gait, Cognitive deficits, Hemiparesis non-dominant and Impaired sensation Rehab Potential: Good ELOS: 14-17   Today's Date: 08/02/2014 PT Individual Time: 1110-1210 PT Individual Time Calculation (min): 60 min    Problem List:  Patient Active Problem List   Diagnosis Date Noted  . Right middle cerebral artery stroke 08/01/2014  . Stroke with cerebral ischemia   . CVA (cerebral vascular accident) 07/30/2014  . Tobacco dependence   . Carotid artery occlusion with infarction   . ETOH abuse   . Acute thrombotic stroke 07/25/2014  . CVA (cerebral infarction) 07/25/2014  . Acute ischemic stroke   . Essential hypertension   . Hyperlipidemia   . Tobacco abuse   . Essential hypertension, benign 10/11/2013  . Mixed hyperlipidemia 10/11/2013    Past Medical History:  Past Medical History  Diagnosis Date  . Arthritis   . Hypertension   . Hyperlipidemia    Past Surgical History:  Past Surgical History  Procedure Laterality Date  . Loop recorder implant  07/27/14    LOOP REVEAL LINQ [id number] - [id number]  . Tee without cardioversion N/A 07/27/2014    Procedure: TRANSESOPHAGEAL ECHOCARDIOGRAM (TEE);  Surgeon: Pixie Casino, MD;  Location: [facility];  Service: Cardiovascular;  Laterality: N/A;  . Ep implantable device N/A 07/27/2014    Procedure: Loop Recorder Insertion;  Surgeon: Evans Lance, MD;  Location: [facility];  Service: Cardiovascular;  Laterality: N/A;    Assessment & Plan Clinical Impression:[name] is a 59 y.o. right handed male with history of hypertension as well as hyperlipidemia with right MCA territory infarct on 07/25/2014 with single right M2 branch occlusion identified just beyond the bifurcation and occluded right ICA origin. Underwent loop recorder placement per Dr. Lovena Le and discharge to home on aspirin  as well as Plavix 07/27/2014 independent without assistive device. Patient was independent prior to initial admission 07/25/2014 working full time living with his wife. Presented 07/29/2014 with increasing left-sided weakness and slurred speech. MRI of the brain showed acute moderate right middle cerebral artery territory infarct further involvement of the operculum, new involvement of the insula and right basal ganglia propagated from prior imaging. Recent echocardiogram with ejection fraction of 98% grade 1 diastolic dysfunction. Recent venous Doppler studies negative for DVT. Patient did not receive TPA. Remains on aspirin and Plavix therapy per neurology services.  Patient transferred to CIR on 08/01/2014 .   Patient currently requires mod with mobility secondary to muscle weakness and muscle joint tightness, impaired timing and sequencing and decreased motor planning and decreased attention, decreased awareness, decreased problem solving and decreased safety awareness.  Prior to hospitalization, patient was independent  with mobility and lived with Spouse in a House home.  Home access is 2Stairs to enter.  Patient will benefit from skilled PT intervention to maximize safe functional mobility, minimize fall risk and decrease caregiver burden for planned discharge home with 24 hour supervision.  Anticipate patient will benefit from follow up OP at discharge.  PT - End of Session Activity Tolerance: Tolerates 10 - 20 min activity with multiple rests Endurance Deficit: Yes Endurance Deficit Description: pt lethargic, and frequently closed eyes during session PT Assessment Rehab Potential (ACUTE/IP ONLY): Good Barriers to Discharge: Decreased caregiver support PT Patient demonstrates impairments in the following area(s): Balance;Behavior;Endurance;Sensory PT Transfers Functional Problem(s): Bed Mobility;Bed to Chair;Car;Furniture;Floor PT Locomotion Functional Problem(s): Ambulation;Wheelchair  Mobility;Stairs PT  Plan PT Intensity: Minimum of 1-2 x/day ,45 to 90 minutes PT Frequency: 5 out of 7 days PT Duration Estimated Length of Stay: 14-17 PT Treatment/Interventions: Ambulation/gait training;Balance/vestibular training;Cognitive remediation/compensation;Discharge planning;Community reintegration;DME/adaptive equipment instruction;Functional mobility training;Patient/family education;Pain management;Neuromuscular re-education;Psychosocial support;Splinting/orthotics;Therapeutic Exercise;Therapeutic Activities;Stair training;UE/LE Strength taining/ROM;UE/LE Coordination activities;Wheelchair propulsion/positioning;Visual/perceptual remediation/compensation PT Transfers Anticipated Outcome(s): supervision basic, floor and car PT Locomotion Anticipated Outcome(s): supervision gait x 300' community, 150' controlled, 50' home env, up/down 12 steps  PT Recommendation Follow Up Recommendations: Outpatient PT Patient destination: Home Equipment Recommended: To be determined  Skilled Therapeutic Intervention: eval completed and ELOS explained to pt. Tx today: neuromuscular re-education via forced use, visual cues, VCs, demo for L attention and LLE loading in sustained squat position during retrieval of small objects on bench in front of him, moving R>L , and L hand use with assistance to move objects L>R.  Berg completed: Patient demonstrates increased fall risk as noted by score of  31 /56 on Berg Balance Scale.  (<36= high risk for falls, close to 100%; 37-45 significant >80%; 46-51 moderate >50%; 52-55 lower >25%). Balance risks explained to pt.  Pt demonstrated impulsivity, standing when therapist was not right next to him, without LOB.  Pt returned to room and quick release belt applied in w/c, awaiting lunch, with all needs within reach.   PT Evaluation Precautions/Restrictions Precautions Precautions: Fall Precaution Comments: impulsive; L neglect Restrictions Weight Bearing  Restrictions: No General   Vital SignsTherapy Vitals Temp: 98.1 F (36.7 C) Temp Source: Oral Pulse Rate: 66 Resp: 20 BP: 111/66 mmHg Patient Position (if appropriate): Sitting Oxygen Therapy SpO2: 100 % O2 Device: Not Delivered Pain Pain Assessment Pain Assessment: No/denies pain Pain Score: 0-No pain Home Living/Prior Functioning Home Living Available Help at Discharge: Family Type of Home: House Home Access: Stairs to enter Technical brewer of Steps: 2 Entrance Stairs-Rails: Right;Left Home Layout: One level  Lives With: Spouse;Other (Comment) Prior Function Level of Independence: Independent with basic ADLs;Independent with gait;Independent with transfers  Able to Take Stairs?: Yes Driving: Yes 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) Leisure: Hobbies-yes (Comment) Comments: golf,anything outside Vision/Perception  Vision - Assessment Eye Alignment: Within Functional Limits Ocular Range of Motion: Within Functional Limits Alignment/Gaze Preference: Gaze right Saccades: Within functional limits Convergence: Within functional limits Additional Comments: L neglect  Cognition Overall Cognitive Status: Impaired/Different from baseline Arousal/Alertness: Lethargic Orientation Level: Oriented X4 Sustained Attention: Impaired (highly distractable by environment on R) Safety/Judgment: Impaired Comments: increased impulsivity Sensation Sensation Light Touch: Impaired Detail Light Touch Impaired Details: Impaired LLE;Absent LUE (absent toes) Proprioception: Impaired Detail Proprioception Impaired Details: Impaired LLE Additional Comments: appears intact L knee, absent position sense ankle Coordination Heel Shin Test: WFLS bil Motor  Motor Motor: Hemiplegia Motor - Skilled Clinical Observations: LLE generalized weakness  Mobility Bed Mobility Bed Mobility: Not assessed Transfers Transfers: Yes Stand Pivot Transfers: 4: Min assist Stand Pivot  Transfer Details: Verbal cues for precautions/safety;Verbal cues for technique Stand Pivot Transfer Details (indicate cue type and reason): max cues for L attention Locomotion  Ambulation Ambulation: Yes Ambulation/Gait Assistance: 4: Min assist Ambulation Distance (Feet): 70 Feet Assistive device: None Ambulation/Gait Assistance Details: Manual facilitation for weight shifting Gait Gait: Yes Gait Pattern: Impaired Gait Pattern: Step-through pattern;Decreased weight shift to left;Decreased hip/knee flexion - left;Decreased dorsiflexion - left;Left flexed knee in stance;Narrow base of support;Decreased trunk rotation;Left foot flat;Shuffle Gait velocity: decreased for age 18 / Additional Locomotion Stairs: Yes Stairs Assistance: 4: Min assist Stairs Assistance Details: Verbal cues for safe use of  DME/AE;Verbal cues for gait pattern Stairs Assistance Details (indicate cue type and reason): max cues for L foot clearance and placement Stair Management Technique: One rail Right;Forwards;Step to pattern Number of Stairs: 12 Height of Stairs: 3 (8 (3"), 4 (7")) Wheelchair Mobility Wheelchair Mobility: Yes Wheelchair Assistance: 4: Min assist (L inattention) Wheelchair Propulsion: Right upper extremity;Right lower extremity Wheelchair Parts Management: Needs assistance Distance: 150  Trunk/Postural Assessment  Cervical Assessment Cervical Assessment: Within Functional Limits Thoracic Assessment Thoracic Assessment: Within Functional Limits Lumbar Assessment Lumbar Assessment: Within Functional Limits Postural Control Postural Control: Deficits on evaluation Protective Responses: delayed and inadequate  Balance   Extremity Assessment      RLE Assessment RLE Assessment: Within Functional Limits (tight heel cord) LLE Assessment LLE Assessment: Exceptions to [facility] (hamstrings and heel cord tight) LLE Strength LLE Overall Strength Comments: grossly in sitting 4/5 hip, 4+/5 knee  and ankle; ankle DF mostly toe ext  FIM:      Refer to Care Plan for Long Term Goals  Recommendations for other services: None  Discharge Criteria: Patient will be discharged from PT if patient refuses treatment 3 consecutive times without medical reason, if treatment goals not met, if there is a change in medical status, if patient makes no progress towards goals or if patient is discharged from hospital.  The above assessment, treatment plan, treatment alternatives and goals were discussed and mutually agreed upon: by patient  Demar Shad 08/02/2014, 4:45 PM

## 2014-08-02 NOTE — Progress Notes (Signed)
Patient complaining of his right eye feeling "dry and itchy", eye lids are lslightly reddened however the eye itself appears normal in colorr.

## 2014-08-02 NOTE — Care Management Note (Signed)
[facility] Individual Statement of Services  Patient Name:  [name]  Date:  08/02/2014  Welcome to the [facility].  Our goal is to provide you with an individualized program based on your diagnosis and situation, designed to meet your specific needs.  With this comprehensive rehabilitation program, you will be expected to participate in at least 3 hours of rehabilitation therapies Monday-Friday, with modified therapy programming on the weekends.  Your rehabilitation program will include the following services:  Physical Therapy (PT), Occupational Therapy (OT), Speech Therapy (ST), 24 hour per day rehabilitation nursing, Therapeutic Recreaction (TR), Neuropsychology, Case Management (Social Worker), Rehabilitation Medicine, Nutrition Services and Pharmacy Services  Weekly team conferences will be held on Wednesday to discuss your progress.  Your Social Worker will talk with you frequently to get your input and to update you on team discussions.  Team conferences with you and your family in attendance may also be held.  Expected length of stay: 14 days  Overall anticipated outcome: supervision with set up and some cueing   Depending on your progress and recovery, your program may change. Your Social Worker will coordinate services and will keep you informed of any changes. Your Social Worker's name and contact numbers are listed  below.  The following services may also be recommended but are not provided by the [facility]:   Driving Evaluations  Home Health Rehabiltiation Services  Outpatient Rehabilitation Services  Vocational Rehabilitation   Arrangements will be made to provide these services after discharge if needed.  Arrangements include referral to agencies that provide these services.  Your insurance has been verified to be:  [employer] Your primary doctor is:  Dow Adolph  Pertinent information will be shared  with your doctor and your insurance company.  Social Worker:  Dossie Der, SW [phone] or ([phone]  Information discussed with and copy given to patient by: Lucy Chris, 08/02/2014, 11:22 AM

## 2014-08-02 NOTE — Evaluation (Signed)
Speech Language Pathology Assessment and Plan  Patient Details  Name: [name] MRN: [id number] Date of Birth: Jun 04, 1955  SLP Diagnosis: Dysarthria;Cognitive Impairments;Dysphagia  Rehab Potential: Good ELOS: 14 days     Today's Date: 08/02/2014 SLP Individual Time: 1303-1400 SLP Individual Time Calculation (min): 57 min   Problem List:  Patient Active Problem List   Diagnosis Date Noted  . Right middle cerebral artery stroke 08/01/2014  . Stroke with cerebral ischemia   . CVA (cerebral vascular accident) 07/30/2014  . Tobacco dependence   . Carotid artery occlusion with infarction   . ETOH abuse   . Acute thrombotic stroke 07/25/2014  . CVA (cerebral infarction) 07/25/2014  . Acute ischemic stroke   . Essential hypertension   . Hyperlipidemia   . Tobacco abuse   . Essential hypertension, benign 10/11/2013  . Mixed hyperlipidemia 10/11/2013   Past Medical History:  Past Medical History  Diagnosis Date  . Arthritis   . Hypertension   . Hyperlipidemia    Past Surgical History:  Past Surgical History  Procedure Laterality Date  . Loop recorder implant  07/27/14    LOOP REVEAL LINQ [id number] - [id number]  . Tee without cardioversion N/A 07/27/2014    Procedure: TRANSESOPHAGEAL ECHOCARDIOGRAM (TEE);  Surgeon: Pixie Casino, MD;  Location: [facility];  Service: Cardiovascular;  Laterality: N/A;  . Ep implantable device N/A 07/27/2014    Procedure: Loop Recorder Insertion;  Surgeon: Evans Lance, MD;  Location: [facility];  Service: Cardiovascular;  Laterality: N/A;    Assessment / Plan / Recommendation Clinical Impression  [name] is a 59 y.o. right handed male with history of hypertension as well as hyperlipidemia with right MCA territory infarct on 07/25/2014. Discharge to home  07/27/2014 independent without assistive device. Patient was independent prior to initial admission 07/25/2014 working full time living with his wife. Presented 07/29/2014 with  increasing left-sided weakness and slurred speech. MRI of the brain showed acute moderate right middle cerebral artery territory infarct further involvement of the operculum, new involvement of the insula and right basal ganglia propagated from prior imaging.  Tolerating a mechanical soft diet.  Patient was admitted for a comprehensive rehabilitation program 08/01/2014.  SLP evaluation completed 08/02/2014 with the following results: Pt presents with moderately severe cognitive deficits.  He scored a 17 out of 30 on the MoCA standardized cognitive assessment with deficits most notable for visuospatial/executive function skills, attention, and abstract reasoning.  Pt also presents with significant left inattention characterized by decreased attention to the left of the environment and his left upper and lower extremities and decreased eye contact with communication partners seated on his left.  Pt exhibits decreased intellectual awareness of his deficits and is impulsive during functional tasks.  Pt is mildly dysarthric due to left sided labial, lingual, and buccal weakness which impacts articulatory precision in sentences and conversations.  The abovementioned oral motor deficits also affect the oral phase of pt's swallow and pt was noted with prolonged mastication and weakened lingual manipulation of dys 3 solid textures resulting in minimal left sided buccal residue remaining in the oral cavity post swallow.  Pt was also noted with impulsivity with rate and portion control with thin liquids which resulted in intermittently wet vocal quality.  Pt required max cues for use of throat clear to correct vocal quality.  No audible wetness noted with small cup sips.  Recommend that pt remain on dys 3 textures and thin liquids with full supervision for use of swallowing  precautions due to cognition.  Pt was independent prior to admission and the abovementioned deficits impact his ability to complete basic, familiar self  care/home management tasks safely.  As a result, pt would benefit from skilled ST while inpatient in order to maximize functional independence and reduce burden of care prior to discharge.    Skilled Therapeutic Interventions          Cognitive-linguistic and bedside swallowing evaluation completed with results and recommendations reviewed with patient.     SLP Assessment  Patient will need skilled Speech Lanaguage Pathology Services during CIR admission    Recommendations  SLP Diet Recommendations: Dysphagia 3 (Mech soft);Thin Liquid Administration via: No straw Medication Administration: Whole meds with liquid Supervision: Patient able to self feed;Full supervision/cueing for compensatory strategies Compensations: Slow rate;Small sips/bites;Check for pocketing Postural Changes and/or Swallow Maneuvers: Seated upright 90 degrees;Upright 30-60 min after meal Oral Care Recommendations: Oral care BID Patient destination: Home Follow up Recommendations: Home Health SLP;24 hour supervision/assistance Equipment Recommended: None recommended by SLP    SLP Frequency 3 to 5 out of 7 days   SLP Treatment/Interventions Cognitive remediation/compensation;Cueing hierarchy;Dysphagia/aspiration precaution training;Functional tasks;Patient/family education;Internal/external aids;Environmental controls    Pain Pain Assessment Pain Assessment: No/denies pain Prior Functioning Cognitive/Linguistic Baseline: Within functional limits Type of Home: House  Lives With: Spouse Available Help at Discharge: Family Education: 8th grade education Vocation: Full time employment  Short Term Goals: Week 1: SLP Short Term Goal 1 (Week 1): Pt will attend to the left of his body and/or the environment during functional tasks with min-mod assist verbal and visual cues.  SLP Short Term Goal 2 (Week 1): Pt will complete self care and/or home management tasks with min-mod assist verbal, visual, and tactile cues for  functional problem solving  SLP Short Term Goal 3 (Week 1): Pt will consume his currently prescribed diet with supervision cues for use of swallowing precautions with minimal overt s/s of aspiration SLP Short Term Goal 4 (Week 1): Pt will consume trials of regular textures with supervision cues to monitor and correct left sided buccal residue over 3 targeted sessions prior to diet advancement.  SLP Short Term Goal 5 (Week 1): Pt will sustain attention to a basic, familiar task for 3-5 minute intervals with min-mod verbal, visual and tactile cues for redirection.  SLP Short Term Goal 6 (Week 1): Pt will initiate a functional task in a timely manner over 50% of opportunities wtih mod assist verbal cues.    See FIM for current functional status Refer to Care Plan for Long Term Goals  Recommendations for other services: None  Discharge Criteria: Patient will be discharged from SLP if patient refuses treatment 3 consecutive times without medical reason, if treatment goals not met, if there is a change in medical status, if patient makes no progress towards goals or if patient is discharged from hospital.  The above assessment, treatment plan, treatment alternatives and goals were discussed and mutually agreed upon: by patient  Emilio Math 08/02/2014, 3:57 PM

## 2014-08-02 NOTE — Progress Notes (Signed)
Occupational Therapy Assessment and Plan  Patient Details  Name: [name] MRN: [id number] Date of Birth: 1955-04-15  OT Diagnosis: cognitive deficits, disturbance of vision, hemiplegia affecting non-dominant side, muscle weakness (generalized) and decreased sensation Rehab Potential: Rehab Potential (ACUTE ONLY): Good (for stated goals) ELOS: 14-17 days   Today's Date: 08/02/2014 OT Individual Time: 1000-1100 OT Individual Time Calculation (min): 60 min     Problem List:  Patient Active Problem List   Diagnosis Date Noted  . Right middle cerebral artery stroke 08/01/2014  . Stroke with cerebral ischemia   . CVA (cerebral vascular accident) 07/30/2014  . Tobacco dependence   . Carotid artery occlusion with infarction   . ETOH abuse   . Acute thrombotic stroke 07/25/2014  . CVA (cerebral infarction) 07/25/2014  . Acute ischemic stroke   . Essential hypertension   . Hyperlipidemia   . Tobacco abuse   . Essential hypertension, benign 10/11/2013  . Mixed hyperlipidemia 10/11/2013    Past Medical History:  Past Medical History  Diagnosis Date  . Arthritis   . Hypertension   . Hyperlipidemia    Past Surgical History:  Past Surgical History  Procedure Laterality Date  . Loop recorder implant  07/27/14    LOOP REVEAL LINQ [id number] - [id number]  . Tee without cardioversion N/A 07/27/2014    Procedure: TRANSESOPHAGEAL ECHOCARDIOGRAM (TEE);  Surgeon: Pixie Casino, MD;  Location: [facility];  Service: Cardiovascular;  Laterality: N/A;  . Ep implantable device N/A 07/27/2014    Procedure: Loop Recorder Insertion;  Surgeon: Evans Lance, MD;  Location: [facility];  Service: Cardiovascular;  Laterality: N/A;    Assessment & Plan Clinical Impression: Patient is a 59 y.o. year old male history of hypertension as well as hyperlipidemia with right MCA territory infarct on 07/25/2014 with single right M2 branch occlusion identified just beyond the bifurcation and occluded  right ICA origin. Underwent loop recorder placement per Dr. Lovena Le and discharge to home on aspirin as well as Plavix 07/27/2014 independent without assistive device. Patient was independent prior to initial admission 07/25/2014 working full time living with his wife. Presented 07/29/2014 with increasing left-sided weakness and slurred speech. MRI of the brain showed acute moderate right middle cerebral artery territory infarct further involvement of the operculum, new involvement of the insula and right basal ganglia propagated from prior imaging. Recent echocardiogram with ejection fraction of 98% grade 1 diastolic dysfunction. Recent venous Doppler studies negative for DVT. Patient did not receive TPA. Remains on aspirin and Plavix therapy per neurology services. Subcutaneous heparin added for DVT prophylaxis. Tolerating a mechanical soft diet. Physical and occupational therapy evaluations completed 07/30/2014 with recommendations of physical medicine rehabilitation consult. .  Patient transferred to CIR on 08/01/2014 .    Patient currently requires mod with basic self-care skills secondary to muscle weakness, decreased cardiorespiratoy endurance, L neglect, decreased initiation, decreased attention, decreased awareness, decreased problem solving, decreased safety awareness and decreased memory and decreased standing balance, decreased postural control, hemiplegia and decreased balance strategies.  Prior to hospitalization, patient could complete ADLs with independent .  Patient will benefit from skilled intervention to increase independence with basic self-care skills prior to discharge home with care partner.  Anticipate patient will require 24 hour supervision and follow up outpatient.  OT - End of Session Activity Tolerance: Decreased this session Endurance Deficit: Yes Endurance Deficit Description: Pt lethargic during session.  OT Assessment Rehab Potential (ACUTE ONLY): Good (for stated  goals) Barriers to Discharge: Decreased caregiver support  OT Patient demonstrates impairments in the following area(s): Balance;Vision;Behavior;Cognition;Safety;Endurance;Sensory;Motor OT Basic ADL's Functional Problem(s): Eating;Grooming;Bathing;Dressing;Toileting OT Advanced ADL's Functional Problem(s):  (n/a) OT Transfers Functional Problem(s): Toilet;Tub/Shower OT Additional Impairment(s): Fuctional Use of Upper Extremity OT Plan OT Intensity: Minimum of 1-2 x/day, 45 to 90 minutes OT Frequency: 5 out of 7 days OT Duration/Estimated Length of Stay: 14-17 days OT Treatment/Interventions: Medical illustrator training;Community reintegration;Neuromuscular re-education;Patient/family education;Self Care/advanced ADL retraining;Therapeutic Exercise;UE/LE Coordination activities;Wheelchair propulsion/positioning;Functional electrical stimulation;Cognitive remediation/compensation;Discharge planning;DME/adaptive equipment instruction;Functional mobility training;Psychosocial support;Therapeutic Activities;UE/LE Strength taining/ROM;Visual/perceptual remediation/compensation OT Self Feeding Anticipated Outcome(s): supervision OT Basic Self-Care Anticipated Outcome(s): supervision OT Toileting Anticipated Outcome(s): supervision OT Bathroom Transfers Anticipated Outcome(s): supervision OT Recommendation Recommendations for Other Services:  (none at this time) Patient destination: Home Follow Up Recommendations: Outpatient OT;24 hour supervision/assistance Equipment Recommended: 3 in 1 bedside comode;Tub/shower bench   Skilled Therapeutic Intervention Upon entering the room, pt supine in bed sleeping and appears to be very lethargic as he is unable to keep eyes open. Many family members present in room but quickly leaving when therapy session began. Pt refusing shower this morning and wife reports she dressed him while pt laying supine in bed prior to therapist arrival. OT educated pt on OT purpose,  POC, and goals with pt and family verbalizing understanding but education to continue. Pt performed supine >sit with Mod A secondary to pt being very impulsive and lethargic. Seated EOB with mod A stand pivot transfer into wheelchair from bed. Pt requesting grooming tasks while seated in wheelchair with Min A and set up to open containers. Pt propelled self in wheelchair via hemiplegic technique towards dayroom but hitting objects to the L side secondary to strong R gaze and obvious L neglect present. OT assisting pt so that he would not harm himself. When given verbal cues to correct self pt just continues to ram wheelchair into objects on L. Pt seated at table top for L UE AROM for shoulder shrugs, elbow flexion/extension, wrist flexion/ext, and digit flexion/ext x 10 reps each. Pt highly distracted and requiring max verbal cues to attend to task, initiate activity, and manual assist to turn head to gaze to L at L UE during task. Pt returning to room via assist from therapist for safety. QRB donned and call bell within reach upon exiting the room.     OT Evaluation Precautions/Restrictions  Precautions Precautions: Fall Precaution Comments: impulsive; L neglect Restrictions Weight Bearing Restrictions: No  Pain Pain Assessment Pain Assessment: No/denies pain Pain Score: 0-No pain Home Living/Prior Functioning Home Living Available Help at Discharge: Family Type of Home: House Home Access: Stairs to enter Technical brewer of Steps: 2 Entrance Stairs-Rails: Right, Left Home Layout: One level  Lives With: Spouse, Other (Comment) IADL History Homemaking Responsibilities: No Current License: Yes Occupation: Full time employment Type of Occupation: Software engineer at [employer] Prior Function Level of Independence: Independent with basic ADLs, Independent with gait, Independent with transfers  Able to Take Stairs?: Yes Driving: Yes Vocation: Full time employment Leisure: Hobbies-yes  (Comment) Comments: golf,anything outside Vision/Perception  Vision- History Baseline Vision/History: Wears glasses Wears Glasses: Reading only Patient Visual Report: Other (comment) Vision- Assessment Vision Assessment?: Yes;Vision impaired- to be further tested in functional context Eye Alignment: Within Functional Limits Ocular Range of Motion: Within Functional Limits Alignment/Gaze Preference: Gaze right Saccades: Within functional limits Convergence: Within functional limits Additional Comments: L neglect  Cognition Overall Cognitive Status: Impaired/Different from baseline Arousal/Alertness: Lethargic Orientation Level: Person;Place;Situation Year: 2016 Month: June Day of Week: Incorrect (wednesday) Immediate Memory Recall:  Sock;Blue;Bed Memory Recall: Blue;Bed Memory Recall Blue: Without Cue Memory Recall Bed: Without Cue Safety/Judgment: Impaired Comments: increased impulsivity Sensation Sensation Light Touch: Impaired Detail Light Touch Impaired Details: Impaired LLE;Absent LUE Stereognosis: Not tested Hot/Cold: Impaired Detail Hot/Cold Impaired Details: Impaired LLE;Impaired LUE Proprioception: Impaired Detail Proprioception Impaired Details: Impaired LLE;Impaired LUE Coordination Gross Motor Movements are Fluid and Coordinated: No Fine Motor Movements are Fluid and Coordinated: No Motor  Motor Motor: Hemiplegia Motor - Skilled Clinical Observations: LLE generalized weakness Mobility  Bed Mobility Bed Mobility: Supine to Sit Supine to Sit: 3: Mod assist Supine to Sit Details: Verbal cues for sequencing;Tactile cues for placement Transfers Transfers: Sit to Stand Sit to Stand: 3: Mod assist;4: Min assist Sit to Stand Details: Verbal cues for sequencing;Verbal cues for precautions/safety;Verbal cues for technique  Trunk/Postural Assessment  Cervical Assessment Cervical Assessment: Within Functional Limits Thoracic Assessment Thoracic Assessment:  Within Functional Limits Lumbar Assessment Lumbar Assessment: Within Functional Limits Postural Control Postural Control: Deficits on evaluation Protective Responses: delayed and inadequate  Balance Static Sitting Balance Static Sitting - Level of Assistance: 5: Stand by assistance Dynamic Sitting Balance Dynamic Sitting - Level of Assistance: 5: Stand by assistance Static Standing Balance Static Standing - Level of Assistance: 4: Min assist Dynamic Standing Balance Dynamic Standing - Level of Assistance: 3: Mod assist;4: Min assist Extremity/Trunk Assessment RUE Assessment RUE Assessment: Within Functional Limits LUE Assessment LUE Assessment: Exceptions to [facility] (2-/5 throughout proximal to distal slightly stronger)  FIM:  FIM - Eating Eating Activity: 5: Needs verbal cues/supervision;4: Helper checks for pocketed food FIM - Grooming Grooming Steps: Oral care, brush teeth, clean dentures;Wash, rinse, dry face;Brush, comb hair Grooming: 4: Patient completes 3 of 4 or 4 of 5 steps FIM - Bed/Chair Transfer Bed/Chair Transfer: 3: Supine > Sit: Mod A (lifting assist/Pt. 50-74%/lift 2 legs;3: Bed > Chair or W/C: Mod A (lift or lower assist) FIM - Radio producer Devices: Grab bars Toilet Transfers: 4-From toilet/BSC: Min A (steadying Pt. > 75%);4-To toilet/BSC: Min A (steadying Pt. > 75%)   Refer to Care Plan for Long Term Goals  Recommendations for other services: None  Discharge Criteria: Patient will be discharged from OT if patient refuses treatment 3 consecutive times without medical reason, if treatment goals not met, if there is a change in medical status, if patient makes no progress towards goals or if patient is discharged from hospital.  The above assessment, treatment plan, treatment alternatives and goals were discussed and mutually agreed upon: by patient  Phineas Semen 08/02/2014, 12:28 PM

## 2014-08-02 NOTE — Progress Notes (Signed)
Ice pack placed on patient's left ankle and for for 20 minutes then left foot and ankle wrapped with ace wrap.

## 2014-08-02 NOTE — Progress Notes (Signed)
Social Work Assessment and Plan Social Work Assessment and Plan  Patient Details  Name: [name] MRN: [id number] Date of Birth: 01-14-1956  Today's Date: 08/02/2014  Problem List:  Patient Active Problem List   Diagnosis Date Noted  . Right middle cerebral artery stroke 08/01/2014  . Stroke with cerebral ischemia   . CVA (cerebral vascular accident) 07/30/2014  . Tobacco dependence   . Carotid artery occlusion with infarction   . ETOH abuse   . Acute thrombotic stroke 07/25/2014  . CVA (cerebral infarction) 07/25/2014  . Acute ischemic stroke   . Essential hypertension   . Hyperlipidemia   . Tobacco abuse   . Essential hypertension, benign 10/11/2013  . Mixed hyperlipidemia 10/11/2013   Past Medical History:  Past Medical History  Diagnosis Date  . Arthritis   . Hypertension   . Hyperlipidemia    Past Surgical History:  Past Surgical History  Procedure Laterality Date  . Loop recorder implant  07/27/14    LOOP REVEAL LINQ [id number] - [id number]  . Tee without cardioversion N/A 07/27/2014    Procedure: TRANSESOPHAGEAL ECHOCARDIOGRAM (TEE);  Surgeon: Chrystie Nose, MD;  Location: [facility];  Service: Cardiovascular;  Laterality: N/A;  . Ep implantable device N/A 07/27/2014    Procedure: Loop Recorder Insertion;  Surgeon: Marinus Maw, MD;  Location: [facility];  Service: Cardiovascular;  Laterality: N/A;   Social History:  reports that he has been smoking Cigarettes.  He has a 20 pack-year smoking history. He does not have any smokeless tobacco history on file. He reports that he drinks about 10.8 oz of alcohol per week. He reports that he does not use illicit drugs.  Family / Support Systems Marital Status: Married Patient Roles: Spouse, Parent, Other (Comment) (employee) Spouse/Significant Other: Marylu Lund  [phone]  [phone]-cell Children: Courtney-daughter [phone]-cell Other Supports: [place]-mom  [phone] Anticipated Caregiver: Wife has a  rare brain disorder and can not physcially assist-daughter unemployed at this time and can assist.  Mom may help some Ability/Limitations of Caregiver: All together can provide care to pt-he is moving well-does have balance issues Caregiver Availability: 24/7 Family Dynamics: Close knit family second marriage-each have children from their first marriages-pt has two and wife has one.  All are involved and supportive of one another.  Pt wants to imporve and recover from this and get back to work.  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   Abuse/Neglect Physical Abuse: Denies Verbal Abuse: Denies Sexual Abuse: Denies Exploitation of patient/patient's resources: Denies Self-Neglect: Denies  Emotional Status Pt's affect, behavior adn adjustment status: Pt is motivated and feels he is not doing that well. He expects himself to be making more progress and it to be quicker.  He was discharged and then two days he was back worse than he was before.  He is the main bread winner of his family and feels pressure to get back to work. Will provide support and have neuro-psych see while here. Recent Psychosocial Issues: recent first CVA 6/8. Found out several health issues when here few weeks ago, wife's illness, she has been very labile since this happened. Pyschiatric History: No history  deferred depression screen due to tired from therapies, but would benefit from neuro-psych to see while here for coping.  Substance Abuse History: Tobacco was enrolled in class to try to quit and ETOH aware needs to  quit drinking beer also.  Patient / Family Perceptions, Expectations & Goals Pt/Family understanding of illness & functional limitations: Pt and family can explain his stroke and deficits.  He was doing well until the extension and had to come back into the hospital. Pt talks with MD and feels their questions are being answered, worry because he extended and just released from the hospital. 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 referrals recommended: Neuropsychology, Support group (specify)  Discharge Planning Living Arrangements: Spouse/significant other, Other relatives Support Systems: Spouse/significant other, Children, Parent, Other relatives, Friends/neighbors, Psychologist, clinical community Type of Residence: Private residence Insurance Resources: Media planner (specify) Herbalist) Financial Resources: Employment, Garment/textile technologist Screen Referred: No Living Expenses: Lives with family Money Management: Spouse, Patient Does the patient have any problems obtaining your medications?: No Home Management: Wife does home management, pt does outside work Associate Professor Plans: Return home with wife and daughter assistin if necessary.  Pt is hopeful he will not require 24 hr physical care.  He does not want to burden his family with his care. Will await team's evaluations and work on a discharge plan. Social Work Anticipated Follow Up Needs: HH/OP, Support Group  Clinical Impression Pleasant gentleman who is willing to do his part to recover from this stroke.  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 His family is willing to do what they can but have limitations with wife's Health issues and daughter's small children.  Will work on discharge plan and offer support to all. Will refer pt to neuro-psych while here.  Lucy Chris 08/02/2014, 1:42 PM

## 2014-08-03 ENCOUNTER — Inpatient Hospital Stay (HOSPITAL_COMMUNITY): Payer: BLUE CROSS/BLUE SHIELD | Admitting: Speech Pathology

## 2014-08-03 ENCOUNTER — Inpatient Hospital Stay (HOSPITAL_COMMUNITY): Payer: BLUE CROSS/BLUE SHIELD | Admitting: Physical Therapy

## 2014-08-03 ENCOUNTER — Inpatient Hospital Stay (HOSPITAL_COMMUNITY): Payer: BLUE CROSS/BLUE SHIELD | Admitting: Occupational Therapy

## 2014-08-03 DIAGNOSIS — G8190 Hemiplegia, unspecified affecting unspecified side: Secondary | ICD-10-CM

## 2014-08-03 DIAGNOSIS — R1314 Dysphagia, pharyngoesophageal phase: Secondary | ICD-10-CM

## 2014-08-03 LAB — GLUCOSE, CAPILLARY
GLUCOSE-CAPILLARY: 100 mg/dL — AB (ref 65–99)
Glucose-Capillary: 97 mg/dL (ref 65–99)
Glucose-Capillary: 130 mg/dL — ABNORMAL HIGH (ref 65–99)
Glucose-Capillary: 89 mg/dL (ref 65–99)

## 2014-08-03 MED ORDER — LISINOPRIL 5 MG PO TABS
5.0000 mg | ORAL_TABLET | Freq: Every day | ORAL | Status: DC
  Administered 2014-08-03 – 2014-08-10 (×8): 5 mg via ORAL
  Filled 2014-08-03 (×11): qty 1

## 2014-08-03 NOTE — Progress Notes (Signed)
59 y.o. right handed male with history of hypertension as well as hyperlipidemia with right MCA territory infarct on 07/25/2014 with single right M2 branch occlusion identified just beyond the bifurcation and occluded right ICA origin. Underwent loop recorder placement per Dr. Lovena Le and discharge to home on aspirin as well as Plavix 07/27/2014 independent without assistive device. Patient was independent prior to initial admission 07/25/2014 working full time living with his wife. Presented 07/29/2014 with increasing left-sided weakness and slurred speech. MRI of the brain showed acute moderate right middle cerebral artery territory infarct further involvement of the operculum, new involvement of the insula and right basal ganglia propagated from prior imaging. Recent echocardiogram with ejection fraction of 25% grade 1 diastolic dysfunction  Subjective/Complaints: Patient seen during breakfast. Denies any coughing with meals. Speech therapy does not note any particular difficulties during meals in terms of self regulation. ROS denies any chest pains or shortness of breath no coughing with meals, no pain complaints in the joints.    Objective: Vital Signs: Blood pressure 137/102, pulse 64, temperature 98.6 F (37 C), temperature source Oral, resp. rate 18, SpO2 97 %. No results found. Results for orders placed or performed during the hospital encounter of 08/01/14 (from the past 72 hour(s))  Glucose, capillary     Status: None   Collection Time: 08/01/14  5:09 PM  Result Value Ref Range   Glucose-Capillary 83 65 - 99 mg/dL   Comment 1 Notify RN   Glucose, capillary     Status: Abnormal   Collection Time: 08/01/14  9:00 PM  Result Value Ref Range   Glucose-Capillary 126 (H) 65 - 99 mg/dL   Comment 1 Notify RN   CBC WITH DIFFERENTIAL     Status: Abnormal   Collection Time: 08/02/14  6:36 AM  Result Value Ref Range   WBC 9.7 4.0 - 10.5 K/uL   RBC 4.55 4.22 - 5.81 MIL/uL   Hemoglobin 14.6 13.0  - 17.0 g/dL   HCT 41.8 39.0 - 52.0 %   MCV 91.9 78.0 - 100.0 fL   MCH 32.1 26.0 - 34.0 pg   MCHC 34.9 30.0 - 36.0 g/dL   RDW 12.3 11.5 - 15.5 %   Platelets 292 150 - 400 K/uL   Neutrophils Relative % 56 43 - 77 %   Neutro Abs 5.4 1.7 - 7.7 K/uL   Lymphocytes Relative 31 12 - 46 %   Lymphs Abs 3.0 0.7 - 4.0 K/uL   Monocytes Relative 11 3 - 12 %   Monocytes Absolute 1.1 (H) 0.1 - 1.0 K/uL   Eosinophils Relative 2 0 - 5 %   Eosinophils Absolute 0.2 0.0 - 0.7 K/uL   Basophils Relative 0 0 - 1 %   Basophils Absolute 0.0 0.0 - 0.1 K/uL  Comprehensive metabolic panel     Status: None   Collection Time: 08/02/14  6:36 AM  Result Value Ref Range   Sodium 135 135 - 145 mmol/L   Potassium 4.0 3.5 - 5.1 mmol/L   Chloride 101 101 - 111 mmol/L   CO2 24 22 - 32 mmol/L   Glucose, Bld 98 65 - 99 mg/dL   BUN 10 6 - 20 mg/dL   Creatinine, Ser 0.77 0.61 - 1.24 mg/dL   Calcium 9.2 8.9 - 10.3 mg/dL   Total Protein 7.6 6.5 - 8.1 g/dL   Albumin 3.5 3.5 - 5.0 g/dL   AST 26 15 - 41 U/L   ALT 26 17 - 63 U/L  Alkaline Phosphatase 71 38 - 126 U/L   Total Bilirubin 1.0 0.3 - 1.2 mg/dL   GFR calc non Af Amer >60 >60 mL/min   GFR calc Af Amer >60 >60 mL/min    Comment: (NOTE) The eGFR has been calculated using the CKD EPI equation. This calculation has not been validated in all clinical situations. eGFR's persistently <60 mL/min signify possible Chronic Kidney Disease.    Anion gap 10 5 - 15  Glucose, capillary     Status: Abnormal   Collection Time: 08/02/14  7:01 AM  Result Value Ref Range   Glucose-Capillary 109 (H) 65 - 99 mg/dL   Comment 1 Notify RN   Glucose, capillary     Status: Abnormal   Collection Time: 08/02/14 12:08 PM  Result Value Ref Range   Glucose-Capillary 131 (H) 65 - 99 mg/dL  Glucose, capillary     Status: None   Collection Time: 08/02/14  4:38 PM  Result Value Ref Range   Glucose-Capillary 96 65 - 99 mg/dL  Glucose, capillary     Status: Abnormal   Collection Time:  08/02/14  9:02 PM  Result Value Ref Range   Glucose-Capillary 150 (H) 65 - 99 mg/dL  Glucose, capillary     Status: Abnormal   Collection Time: 08/03/14  6:44 AM  Result Value Ref Range   Glucose-Capillary 100 (H) 65 - 99 mg/dL     HEENT: normal Cardio: RRR Resp: CTA B/L and unlabored GI: BS positive Extremity:  Pulses positive and No Edema Skin:   Intact and Other IV site ok Neuro: Lethargic, Abnormal Sensory reduced sensation to pinch in LUE, Abnormal Motor 2- Left Bi, tri, 0/5 in fingers and wrist, 3- in LLE and Dysarthric Musc/Skel:  Other no pain with UE or LE ROM Gen NAD   Assessment/Plan: 1. Functional deficits secondary to RIght MCA infarct with left hemiparesis which require 3+ hours per day of interdisciplinary therapy in a comprehensive inpatient rehab setting. Physiatrist is providing close team supervision and 24 hour management of active medical problems listed below. Physiatrist and rehab team continue to assess barriers to discharge/monitor patient progress toward functional and medical goals.  FIM:          FIM - Radio producer Devices: Grab bars Toilet Transfers: 4-From toilet/BSC: Min A (steadying Pt. > 75%), 4-To toilet/BSC: Min A (steadying Pt. > 75%)  FIM - Bed/Chair Transfer Bed/Chair Transfer: 3: Supine > Sit: Mod A (lifting assist/Pt. 50-74%/lift 2 legs, 3: Bed > Chair or W/C: Mod A (lift or lower assist)  FIM - Locomotion: Wheelchair Distance: 150 FIM - Locomotion: Ambulation Ambulation/Gait Assistance: 4: Min assist Locomotion: Ambulation: 2: Travels 50 - 149 ft with minimal assistance (Pt.>75%)  Comprehension Comprehension Mode: Auditory Comprehension: 5-Understands complex 90% of the time/Cues < 10% of the time  Expression Expression: 5-Expresses basic 90% of the time/requires cueing < 10% of the time.  Social Interaction Social Interaction: 4-Interacts appropriately 75 - 89% of the time - Needs redirection  for appropriate language or to initiate interaction.  Problem Solving Problem Solving Mode: Asleep Problem Solving: 3-Solves basic 50 - 74% of the time/requires cueing 25 - 49% of the time  Memory Memory: 3-Recognizes or recalls 50 - 74% of the time/requires cueing 25 - 49% of the time  Medical Problem List and Plan: 1. Functional deficits secondary to extension right MCA infarct embolic from right ICA occlusion 2.  DVT Prophylaxis/Anticoagulation: Subcutaneous heparin. Monitor platelet counts of any signs of  bleeding 3. Pain Management: Tylenol as needed 4. Tobacco abuse. Continue Nicoderm patch. Provide counseling 5. Neuropsych: This patient is capable of making decisions on his own behalf. 6. Skin/Wound Care: Routine skin checks 7. Fluids/Electrolytes/Nutrition: Routine I&O is with follow-up chemistries 8. Hyperlipidemia. Lipitor 9. Hypertension. No present antihypertensives medication. Patient on Prinzide/Zestoretic 20-12.5 mg daily prior to admission. Resume as needed, has diastolic elevation will resume Prinivil, may need to add diuretic in future   LOS (Days) 2 A FACE TO FACE EVALUATION WAS PERFORMED  KIRSTEINS,ANDREW E 08/03/2014, 8:34 AM

## 2014-08-03 NOTE — Progress Notes (Addendum)
Speech Language Pathology Daily Session Note  Patient Details  Name: [name] MRN: [id number] Date of Birth: 15-Mar-1955  Today's Date: 08/03/2014 SLP Group Time: 0800-0855 SLP Group Time Calculation (min): 55 min  Short Term Goals: Week 1: SLP Short Term Goal 1 (Week 1): Pt will attend to the left of his body and/or the environment during functional tasks with min-mod assist verbal and visual cues.  SLP Short Term Goal 2 (Week 1): Pt will complete self care and/or home management tasks with min-mod assist verbal, visual, and tactile cues for functional problem solving  SLP Short Term Goal 3 (Week 1): Pt will consume his currently prescribed diet with supervision cues for use of swallowing precautions with minimal overt s/s of aspiration SLP Short Term Goal 4 (Week 1): Pt will consume trials of regular textures with supervision cues to monitor and correct left sided buccal residue over 3 targeted sessions prior to diet advancement.  SLP Short Term Goal 5 (Week 1): Pt will sustain attention to a basic, familiar task for 3-5 minute intervals with min-mod verbal, visual and tactile cues for redirection.  SLP Short Term Goal 6 (Week 1): Pt will initiate a functional task in a timely manner over 50% of opportunities wtih mod assist verbal cues.    Skilled Therapeutic Interventions:   Pt was seen for skilled group ST targeting goals for dysphagia and cognition.  Pt consumed presentations of his currently prescribed diet with min cues for use of swallowing precautions to monitor and correct left sided buccal residue.   No overt s/s of aspiration were evident with dys 3 solids, but pt was noted with wet vocal quality following large consecutive sips.  Vocal quality cleared with min cues for volitional throat clear and thins were tolerated well with min verbal cues for use of small sips.  Pt also benefited from min verbal cues to locate items on the left side of his tray and facilitate initiation and  functional problem solving for tray set up. Pt selectively attended to task in a highly distracting environment with mod verbal cues for redirection. Continue per current plan of care.     FIM:  Comprehension Comprehension Mode: Auditory Comprehension: 5-Understands complex 90% of the time/Cues < 10% of the time Expression Expression Mode: Verbal Expression: 5-Expresses basic 90% of the time/requires cueing < 10% of the time. Social Interaction Social Interaction: 3-Interacts appropriately 50 - 74% of the time - May be physically or verbally inappropriate. Problem Solving Problem Solving: 3-Solves basic 50 - 74% of the time/requires cueing 25 - 49% of the time Memory Memory: 3-Recognizes or recalls 50 - 74% of the time/requires cueing 25 - 49% of the time FIM - Eating Eating Activity: 5: Needs verbal cues/supervision  Pain Pain Assessment Pain Assessment: No/denies pain  Therapy/Group: Group Therapy, Dysphagia Group and Cognitive Group  Naveh Rickles, Melanee Spry 08/03/2014, 5:20 PM

## 2014-08-03 NOTE — IPOC Note (Signed)
Overall Plan of Care [facility]) Patient Details Name: [name] MRN: [id number] DOB: 12-Jan-1956  Admitting Diagnosis: COGNITIVE DEFICTS IMPULSIVITY  Hospital Problems: Principal Problem:   Right middle cerebral artery stroke Active Problems:   Essential hypertension, benign     Functional Problem List: Nursing Bowel, Medication Management, Safety, Skin Integrity  PT Balance, Behavior, Endurance, Sensory  OT Balance, Vision, Behavior, Cognition, Safety, Endurance, Sensory, Motor  SLP Cognition, Nutrition, Linguistic  TR         Basic ADL's: OT Eating, Grooming, Bathing, Dressing, Toileting     Advanced  ADL's: OT  (n/a)     Transfers: PT Bed Mobility, Bed to Chair, Car, [employer], Civil Service fast streamer, Research scientist (life sciences): PT Ambulation, Psychologist, prison and probation services, Stairs     Additional Impairments: OT Fuctional Use of Upper Extremity  SLP Swallowing, Communication, Social Cognition expression Problem Solving, Memory, Attention, Awareness  TR      Anticipated Outcomes Item Anticipated Outcome  Self Feeding supervision  Swallowing  Supervision with least restrictive diet    Basic self-care  supervision  Toileting  supervision   Bathroom Transfers supervision  Bowel/Bladder  Supervision to manage bowel, meds..Anticipate toileting needs  Transfers  supervision basic, floor and car  Locomotion  supervision gait x 300' community, 150' controlled, 50' home env, up/down 12 steps   Communication  mod I   Cognition  min assist   Pain     Safety/Judgment  Increased safety awareness, awareness of deficits, anticipate needs.   Therapy Plan: PT Intensity: Minimum of 1-2 x/day ,45 to 90 minutes PT Frequency: 5 out of 7 days PT Duration Estimated Length of Stay: 14-17 OT Intensity: Minimum of 1-2 x/day, 45 to 90 minutes OT Frequency: 5 out of 7 days OT Duration/Estimated Length of Stay: 14-17 days SLP Intensity: Minumum of 1-2 x/day, 30 to 90 minutes SLP  Frequency: 3 to 5 out of 7 days SLP Duration/Estimated Length of Stay: 14 days        Team Interventions: Nursing Interventions Patient/Family Education, Bowel Management, Disease Management/Prevention, Medication Management, Skin Care/Wound Management, Psychosocial Support, Discharge Planning  PT interventions Ambulation/gait training, Balance/vestibular training, Cognitive remediation/compensation, Discharge planning, Community reintegration, DME/adaptive equipment instruction, Functional mobility training, Patient/family education, Pain management, Neuromuscular re-education, Psychosocial support, Splinting/orthotics, Therapeutic Exercise, Therapeutic Activities, Stair training, UE/LE Strength taining/ROM, UE/LE Coordination activities, Wheelchair propulsion/positioning, Visual/perceptual remediation/compensation  OT Interventions Warden/ranger, Firefighter, Neuromuscular re-education, Patient/family education, Self Care/advanced ADL retraining, Therapeutic Exercise, UE/LE Coordination activities, Wheelchair propulsion/positioning, Functional electrical stimulation, Cognitive remediation/compensation, Discharge planning, DME/adaptive equipment instruction, Functional mobility training, Psychosocial support, Therapeutic Activities, UE/LE Strength taining/ROM, Visual/perceptual remediation/compensation  SLP Interventions Cognitive remediation/compensation, Cueing hierarchy, Dysphagia/aspiration precaution training, Functional tasks, Patient/family education, Internal/external aids, Environmental controls  TR Interventions    SW/CM Interventions Discharge Planning, Psychosocial Support, Patient/Family Education    Team Discharge Planning: Destination: PT-Home ,OT- Home , SLP-Home Projected Follow-up: PT-Outpatient PT, OT-  Outpatient OT, 24 hour supervision/assistance, SLP-Home Health SLP, 24 hour supervision/assistance Projected Equipment Needs: PT-To be determined, OT- 3 in  1 bedside comode, Tub/shower bench, SLP-None recommended by SLP Equipment Details: PT- , OT-  Patient/family involved in discharge planning: PT- Patient,  OT-Patient, SLP-Patient  MD ELOS: 15-18d Medical Rehab Prognosis:  Excellent Assessment: 59 y.o. right handed male with history of hypertension as well as hyperlipidemia with right MCA territory infarct on 07/25/2014 with single right M2 branch occlusion identified just beyond the bifurcation and occluded right ICA origin. Underwent loop recorder placement per Dr. Ladona Ridgel and  discharge to home on aspirin as well as Plavix 07/27/2014 independent without assistive device. Patient was independent prior to initial admission 07/25/2014 working full time living with his wife. Presented 07/29/2014 with increasing left-sided weakness and slurred speech. MRI of the brain showed acute moderate right middle cerebral artery territory infarct further involvement of the operculum, new involvement of the insula and right basal ganglia propagated from prior imaging. Recent echocardiogram with ejection fraction of 70% grade 1 diastolic dysfunction  Now requiring 24/7 Rehab RN,MD, as well as CIR level PT, OT and SLP.  Treatment team will focus on ADLs and mobility with goals set at Sup   See Team Conference Notes for weekly updates to the plan of care

## 2014-08-03 NOTE — Progress Notes (Signed)
Physical Therapy Session Note  Patient Details  Name: [name] MRN: [id number] Date of Birth: 01/29/1956  Today's Date: 08/03/2014 PT Individual Time: 1436-1550 PT Individual Time Calculation (min): 74 min   Short Term Goals: Week 1:  PT Short Term Goal 1 (Week 1): pt will perform bed mobility with 0-1 cue for L attention PT Short Term Goal 2 (Week 1): pt will transfer bed>< w/c to L with supervision with 2 or less cues for L attention PT Short Term Goal 3 (Week 1): pt will perform gait x 150' controlled env with close supervision, with 5 or less cues for attention PT Short Term Goal 4 (Week 1): pt will ascend/descend ramp with supervision PT Short Term Goal 5 (Week 1): pt will ascend/descend 12 steps 1 rail with min assist  Skilled Therapeutic Interventions/Progress Updates:   Pt received in bed resting.  Pt stating interest in going outside.  Performed transfer to EOB to R with supervision but verbal cues to attend to LUE and LLE positioning.  Donned shoes EOB with supervision but required assistance to tie laces.  Performed transfer stand step to w/c min A to L side with verbal cues for pivoting sequence to the L.  Transported to gym in w/c and performed gait training x 150' x 2 in controlled environment with supervision (no AD) with focus on maintaining gaze in midline + scanning and attention to L environment with cues.  Performed simulated SUV transfer with pt electing to stand on RLE and step LLE into car first with min A for balance and verbal cues for positioning of LUE and LLE once in vehicle.  Returned to gym and continued training with floor > furniture transfer and discussing indications for calling EMS; pt return demonstrated floor > furniture transfer with min A and mod verbal cues for sequence and positioning of LUE and LLE to stand.  Performed stair negotiation training up/down 12 stairs x 2 reps with RUE support on rail with pt utilizing step to and alternating sequence with min  A and verbal cues for full L foot advancement placement on step prior to advancing COG.  Back in gym transferred to w/c and outside in w/c total A.  Outside pt engaged in community ambulation and gait on uneven pavement, grass, up/down incline, up/down curbs, transfers from community benches and tables all with min A and mod-max verbal cues for attention to obstacles and people on L side and for safety.  Returned to rehab and to room in w/c total A.  Transferred w/c  > bed min A but required min-mod A to return to supine on bed due to positioning of body and LUE/LLE.  Pt progressing well with mobility and can state a need to pay more attention to L side but emergent awareness still absent/impaired in functional tasks.   Therapy Documentation Precautions:  Precautions Precautions: Fall Precaution Comments: impulsive; L neglect Restrictions Weight Bearing Restrictions: No Vital Signs: Therapy Vitals Temp: 99.1 F (37.3 C) Temp Source: Oral Resp: 19 BP: 114/70 mmHg Patient Position (if appropriate): Sitting Oxygen Therapy SpO2: 100 % O2 Device: Not Delivered Pain: Pain Assessment Pain Assessment: No/denies pain Locomotion : Ambulation Ambulation/Gait Assistance: 4: Min guard;5: Supervision   See FIM for current functional status  Therapy/Group: Individual Therapy  Edman Circle [facility] 08/03/2014, 4:16 PM

## 2014-08-03 NOTE — Progress Notes (Signed)
Occupational Therapy Session Note  Patient Details  Name: [name] MRN: [id number] Date of Birth: [birth year]  Today's Date: 08/03/2014 OT Individual Time: 1300-1400 OT Individual Time Calculation (min): 60 min    Short Term Goals: Week 1:  OT Short Term Goal 1 (Week 1): Pt will perform shower transfer with mod A into and out of shower in order to  decrease level of assist for functional transfers. OT Short Term Goal 2 (Week 1): Pt will perform toilet transfer with min A in order to increase I with functional transfers.  OT Short Term Goal 3 (Week 1): Pt will require no more than steady assist for balance during toileting. OT Short Term Goal 4 (Week 1): Pt will utilize strategies as needed with mod verbal cues or less to attend to L environment during therapy sessions. OT Short Term Goal 5 (Week 1): Pt will perform UB dressing with min A in order to increase I in self care.  Skilled Therapeutic Interventions/Progress Updates:    Engaged in therapeutic activity with focus on functional transfers, Lt attention, and LUE NMR.  Pt in bed upon arrival finishing lunch.  Pt required increased cues to attend to task of self-feeding.  Stand pivot transfer bed > w/c > toilet with min/steady assist.  Pt completed toileting in standing with close supervision.  In therapy gym engaged in functional mobility with RW progressing to without AD.  Pt required min-mod verbal cues to scan to the Lt as he would bump into items on Lt both with RW and without due to Lt neglect.  Incorporated scanning to Lt to locate objects to increase attention to Lt with no carryover into function.  UE Ranger in sitting with focus on visually attending to LUE while completing shoulder flexion/extension.  Reaching activity with Rt while weight bearing through Lt followed by reaching with LUE with pt able to complete reaching task but required additional assist to maintain grasp on items.  Pt ambulated approx 100 feet without AD back to  room and left semi-reclined in bed.  Therapy Documentation Precautions:  Precautions Precautions: Fall Precaution Comments: impulsive; L neglect Restrictions Weight Bearing Restrictions: No General:   Vital Signs: Therapy Vitals Temp: 99.1 F (37.3 C) Temp Source: Oral Resp: 19 BP: 114/70 mmHg Patient Position (if appropriate): Sitting Oxygen Therapy SpO2: 100 % O2 Device: Not Delivered Pain:  Pt with no c/o pain  See FIM for current functional status  Therapy/Group: Individual Therapy  Rosalio Loud 08/03/2014, 3:07 PM

## 2014-08-04 ENCOUNTER — Encounter (HOSPITAL_COMMUNITY): Payer: BLUE CROSS/BLUE SHIELD | Admitting: Occupational Therapy

## 2014-08-04 ENCOUNTER — Inpatient Hospital Stay (HOSPITAL_COMMUNITY): Payer: BLUE CROSS/BLUE SHIELD | Admitting: Occupational Therapy

## 2014-08-04 ENCOUNTER — Inpatient Hospital Stay (HOSPITAL_COMMUNITY): Payer: BLUE CROSS/BLUE SHIELD | Admitting: Speech Pathology

## 2014-08-04 ENCOUNTER — Inpatient Hospital Stay (HOSPITAL_COMMUNITY): Payer: BLUE CROSS/BLUE SHIELD | Admitting: *Deleted

## 2014-08-04 DIAGNOSIS — E785 Hyperlipidemia, unspecified: Secondary | ICD-10-CM

## 2014-08-04 LAB — GLUCOSE, CAPILLARY
GLUCOSE-CAPILLARY: 113 mg/dL — AB (ref 65–99)
Glucose-Capillary: 85 mg/dL (ref 65–99)
Glucose-Capillary: 108 mg/dL — ABNORMAL HIGH (ref 65–99)
Glucose-Capillary: 130 mg/dL — ABNORMAL HIGH (ref 65–99)

## 2014-08-04 NOTE — Progress Notes (Signed)
Physical Therapy Session Note  Patient Details  Name: Saqib Sunde MRN: [id number] Date of Birth: [birth year]  Today's Date: 08/04/2014 PT Individual Time: 1400-1445 PT Individual Time Calculation (min): 45 min    Skilled Therapeutic Interventions/Progress Updates:  Gait training around hospital with SBA to min A , path finding and navigating in environment using posted signs to find locations, patient has difficulty with paying attention to L side ,due to strong neglect needs increased cues for safety and to complete tasks. Patient can not navigate back upstairs from the gift shop ,max cues needed to finding his way back, decreased safety with turning and maneuvering around objects especially on L side. Stairs training 1 x 7 steps up and down with cues to place feet on steps ,patient unsafe as he only places part on foot on the step and attempts to weight bear.  Transfer to and from car with min cues for sequencing and to prevent from hitting with the head on L side.  Balance training on [employer] with steady surface and on level 12 to increase difficulty, manual assistance to prevent L side LOB and instruct/guide patient to weight shift R. Patient returned to room , sitting in W/c with QR belt on , all needs within reach. Therapy Documentation Precautions:  Precautions Precautions: Fall Precaution Comments: impulsive; L neglect Restrictions Weight Bearing Restrictions: No Vital Signs: Therapy Vitals Temp: 98.1 F (36.7 C) Temp Source: Oral Pulse Rate: 66 Resp: 18 BP: 98/71 mmHg Oxygen Therapy SpO2: 100 % O2 Device: Not Delivered Locomotion : Ambulation Ambulation/Gait Assistance: 4: Min guard;5: Supervision   See FIM for current functional status  Therapy/Group: Individual Therapy  Dorna Mai 08/04/2014, 4:11 PM

## 2014-08-04 NOTE — Progress Notes (Signed)
Speech Language Pathology Daily Session Note  Patient Details  Name: Yeniel Harbold MRN: [id number] Date of Birth: Mar 19, 1955  Today's Date: 08/04/2014 SLP Individual Time: 1445-1530 SLP Individual Time Calculation (min): 45 min  Short Term Goals: Week 1: SLP Short Term Goal 1 (Week 1): Pt will attend to the left of his body and/or the environment during functional tasks with min-mod assist verbal and visual cues.  SLP Short Term Goal 2 (Week 1): Pt will complete self care and/or home management tasks with min-mod assist verbal, visual, and tactile cues for functional problem solving  SLP Short Term Goal 3 (Week 1): Pt will consume his currently prescribed diet with supervision cues for use of swallowing precautions with minimal overt s/s of aspiration SLP Short Term Goal 4 (Week 1): Pt will consume trials of regular textures with supervision cues to monitor and correct left sided buccal residue over 3 targeted sessions prior to diet advancement.  SLP Short Term Goal 5 (Week 1): Pt will sustain attention to a basic, familiar task for 3-5 minute intervals with min-mod verbal, visual and tactile cues for redirection.  SLP Short Term Goal 6 (Week 1): Pt will initiate a functional task in a timely manner over 50% of opportunities wtih mod assist verbal cues.    Skilled Therapeutic Interventions: Skilled treatment session focused on cognitive goals. SLP facilitated session by providing Mod A verbal cues for functional problem solving, self-monitoring and correct errors and attention during a basic money management task. Patient also required Mod A verbal cues to attend to left field of environment during a functional conversation with clinician. Patient verbalized intellectual awareness of cognitive impairments but required Mod A verbal and question cues for emergent awareness throughout session. Patient left upright in wheelchair with quick release belt in place, all needs within reach and family present.  Continue with current plan of care.    FIM:  Comprehension Comprehension Mode: Auditory Comprehension: 5-Understands complex 90% of the time/Cues < 10% of the time Expression Expression Mode: Verbal Expression: 5-Expresses basic 90% of the time/requires cueing < 10% of the time. Social Interaction Social Interaction: 3-Interacts appropriately 50 - 74% of the time - May be physically or verbally inappropriate. Problem Solving Problem Solving: 3-Solves basic 50 - 74% of the time/requires cueing 25 - 49% of the time Memory Memory: 3-Recognizes or recalls 50 - 74% of the time/requires cueing 25 - 49% of the time  Pain Pain Assessment Pain Assessment: No/denies pain  Therapy/Group: Individual Therapy  Joliet Mallozzi 08/04/2014, 4:15 PM

## 2014-08-04 NOTE — Progress Notes (Signed)
Jayron Boccio is a 59 y.o. male 1955/10/23 [id number]  Subjective: No new complaints. No new problems. Slept well. Feeling OK.  Objective: Vital signs in last 24 hours: Temp:  [97.9 F (36.6 C)-99.1 F (37.3 C)] 97.9 F (36.6 C) (06/18 0530) Pulse Rate:  [58] 58 (06/18 0530) Resp:  [18-19] 18 (06/18 0530) BP: (114-124)/(68-70) 124/68 mmHg (06/18 0530) SpO2:  [97 %-100 %] 97 % (06/18 0530) Weight change:  Last BM Date: 08/01/14  Intake/Output from previous day: 06/17 0701 - 06/18 0700 In: 480 [P.O.:480] Out: -  Last cbgs: CBG (last 3)   Recent Labs  08/03/14 1625 08/03/14 2122 08/04/14 0652  GLUCAP 130* 89 85     Physical Exam General: No apparent distress  Eating breakfast HEENT: not dry Lungs: Normal effort. Lungs clear to auscultation, no crackles or wheezes. Cardiovascular: Regular rate and rhythm, no edema Abdomen: S/NT/ND; BS(+) Musculoskeletal:  unchanged Neurological: No new neurological deficits Wounds: N/A    Skin: clear   Mental state: Alert, oriented, cooperative    Lab Results: BMET    Component Value Date/Time   NA 135 08/02/2014 0636   K 4.0 08/02/2014 0636   CL 101 08/02/2014 0636   CO2 24 08/02/2014 0636   GLUCOSE 98 08/02/2014 0636   BUN 10 08/02/2014 0636   CREATININE 0.77 08/02/2014 0636   CREATININE 0.75 05/10/2014 0951   CALCIUM 9.2 08/02/2014 0636   GFRNONAA >60 08/02/2014 0636   GFRAA >60 08/02/2014 0636   CBC    Component Value Date/Time   WBC 9.7 08/02/2014 0636   WBC 9.3 10/11/2013 1744   RBC 4.55 08/02/2014 0636   RBC 4.93 10/11/2013 1744   HGB 14.6 08/02/2014 0636   HGB 15.5 10/11/2013 1744   HCT 41.8 08/02/2014 0636   HCT 48.0 10/11/2013 1744   PLT 292 08/02/2014 0636   MCV 91.9 08/02/2014 0636   MCV 97.4* 10/11/2013 1744   MCH 32.1 08/02/2014 0636   MCH 31.5* 10/11/2013 1744   MCHC 34.9 08/02/2014 0636   MCHC 32.3 10/11/2013 1744   RDW 12.3 08/02/2014 0636   LYMPHSABS 3.0 08/02/2014 0636   MONOABS 1.1*  08/02/2014 0636   EOSABS 0.2 08/02/2014 0636   BASOSABS 0.0 08/02/2014 0636    Studies/Results: No results found.  Medications: I have reviewed the patient's current medications.  Assessment/Plan:  1. Functional deficits secondary to extension right MCA infarct embolic from right ICA occlusion 2. DVT Prophylaxis/Anticoagulation: Subcutaneous heparin. Monitor platelet counts of any signs of bleeding 3. Pain Management: Tylenol as needed 4. Tobacco abuse. Continue Nicoderm patch. Provide counseling 5. Neuropsych: This patient is capable of making decisions on his own behalf. 6. Skin/Wound Care: Routine skin checks 7. Fluids/Electrolytes/Nutrition: Routine I&O is with follow-up chemistries 8. Hyperlipidemia. Lipitor 9. Hypertension. No present antihypertensives medication. Patient on Prinzide/Zestoretic 20-12.5 mg daily prior to admission. Resume as needed, has diastolic elevation will resume Prinivil, may need to add diuretic in future     Length of stay, days: 3  Sonda Primes , MD 08/04/2014, 9:40 AM

## 2014-08-04 NOTE — Progress Notes (Signed)
Occupational Therapy Session Note  Patient Details  Name: [name] MRN: [id number] Date of Birth: 05-Nov-1955  Today's Date: 08/04/2014 OT Individual Time: 1157-2620  and 1300-1341 OT Individual Time Calculation (min): 55 min and 41 min   Short Term Goals: Week 1:  OT Short Term Goal 1 (Week 1): Pt will perform shower transfer with mod A into and out of shower in order to  decrease level of assist for functional transfers. OT Short Term Goal 2 (Week 1): Pt will perform toilet transfer with min A in order to increase I with functional transfers.  OT Short Term Goal 3 (Week 1): Pt will require no more than steady assist for balance during toileting. OT Short Term Goal 4 (Week 1): Pt will utilize strategies as needed with mod verbal cues or less to attend to L environment during therapy sessions. OT Short Term Goal 5 (Week 1): Pt will perform UB dressing with min A in order to increase I in self care.  Skilled Therapeutic Interventions/Progress Updates:  Session 1: Upon entering the room, pt supine in bed with no c/o pain. Pt requiring multiple verbal cues for supine >sit as pt closing eyes and ignoring therapist. Pt performed supine >sit with supervision. Pt ambulating with steady assist around room to obtain clothing items for dressing task. Pt then ambulated without use of AD into bathroom for shower transfer. Pt very impulsive during session with ambulation and transfers. Pt seated on TTB and not standing during shower. Hand over hand assist for L UE to be incorporated into functional bathing tasks. Once washed, pt seated on toilet to don pull over shirt and underwear. Pt requiring multiple verbal cues to sit when donning underwear secondary to safety concern. Clothing having to be taken away from pt, and direct, one step verbal cue given to sit before pt would comply. Pt standing at sink to shave face with electric razor and brush teeth with set up A and supervision for balance. Pt returning to  bed with supervision for sit >supine. Bed alarm activated and call bell within reach.   Session 2: Upon entering the room, pt supine in bed with no c/o pain this session. Pt performed supine >sit with supervision. Skilled OT session with focus on L inattention, functional mobility, and community mobility. Pt transferred stand pivot from bed into wheelchair with supervision. OT propelled wheelchair for energy conservation to lobby. Pt ambulated outside with supervision - min A on various surfaces and slopes. Pt requiring min verbal cues and occasional min A as pt would come very close to walking into planters, side of building, etc on L side. Pt ambulated up and down 5 steps with use of handrail , close supervision, and verbal cues for proper technique as needed. Pt ambulated back into lobby and to gift shop where he required close supervision - min A for ambulation through tight aisles and spaces. Pt never taking seated rest break or returning to wheelchair. Pt ambulated onto elevator and scanning area to locate buttons for elevator with min verbal cues to attend to L side. Pt returning to room and seated on EOB for rest break. Pt returning to supine with bed alarm activated and call bell within reach upon exiting the room.   Therapy Documentation Precautions:  Precautions Precautions: Fall Precaution Comments: impulsive; L neglect Restrictions Weight Bearing Restrictions: No  Pain: Pain Assessment Pain Score: 0-No pain Faces Pain Scale: No hurt PAINAD (Pain Assessment in Advanced Dementia) Breathing: normal  See FIM for current  functional status  Therapy/Group: Individual Therapy  Lowella Grip 08/04/2014, 9:56 AM

## 2014-08-05 ENCOUNTER — Inpatient Hospital Stay (HOSPITAL_COMMUNITY): Payer: BLUE CROSS/BLUE SHIELD | Admitting: Physical Therapy

## 2014-08-05 NOTE — Progress Notes (Addendum)
Jaze Colantonio is a 59 y.o. male November 07, 1955 [id number]  Subjective: No new complaints.  Slept well. Feeling OK.  Objective: Vital signs in last 24 hours: Temp:  [98.1 F (36.7 C)] 98.1 F (36.7 C) (06/19 0530) Pulse Rate:  [60-66] 60 (06/19 0530) Resp:  [18] 18 (06/19 0530) BP: (98-115)/(64-71) 115/64 mmHg (06/19 0530) SpO2:  [98 %-100 %] 98 % (06/19 0530) Weight change:  Last BM Date: 08/04/14  Intake/Output from previous day: 06/18 0701 - 06/19 0700 In: 480 [P.O.:480] Out: -  Last cbgs: CBG (last 3)   Recent Labs  08/04/14 1117 08/04/14 1713 08/04/14 2100  GLUCAP 108* 130* 113*     Physical Exam General: No apparent distress  Eating breakfast HEENT: not dry Lungs: Normal effort. Lungs clear to auscultation, no crackles or wheezes. Cardiovascular: Regular rate and rhythm, no edema Abdomen: S/NT/ND; BS(+) Musculoskeletal:  unchanged Neurological: No new neurological deficits Wounds: N/A    Skin: clear   Mental state: Alert, oriented, cooperative    Lab Results: BMET    Component Value Date/Time   NA 135 08/02/2014 0636   K 4.0 08/02/2014 0636   CL 101 08/02/2014 0636   CO2 24 08/02/2014 0636   GLUCOSE 98 08/02/2014 0636   BUN 10 08/02/2014 0636   CREATININE 0.77 08/02/2014 0636   CREATININE 0.75 05/10/2014 0951   CALCIUM 9.2 08/02/2014 0636   GFRNONAA >60 08/02/2014 0636   GFRAA >60 08/02/2014 0636   CBC    Component Value Date/Time   WBC 9.7 08/02/2014 0636   WBC 9.3 10/11/2013 1744   RBC 4.55 08/02/2014 0636   RBC 4.93 10/11/2013 1744   HGB 14.6 08/02/2014 0636   HGB 15.5 10/11/2013 1744   HCT 41.8 08/02/2014 0636   HCT 48.0 10/11/2013 1744   PLT 292 08/02/2014 0636   MCV 91.9 08/02/2014 0636   MCV 97.4* 10/11/2013 1744   MCH 32.1 08/02/2014 0636   MCH 31.5* 10/11/2013 1744   MCHC 34.9 08/02/2014 0636   MCHC 32.3 10/11/2013 1744   RDW 12.3 08/02/2014 0636   LYMPHSABS 3.0 08/02/2014 0636   MONOABS 1.1* 08/02/2014 0636   EOSABS 0.2  08/02/2014 0636   BASOSABS 0.0 08/02/2014 0636    Studies/Results: No results found.  Medications: I have reviewed the patient's current medications.  Assessment/Plan:  1. Functional deficits secondary to extension right MCA infarct embolic from right ICA occlusion 2. DVT Prophylaxis/Anticoagulation: Subcutaneous heparin. Monitor platelet counts of any signs of bleeding 3. Pain Management: Tylenol as needed 4. Tobacco abuse. Continue Nicoderm patch. Provide counseling 5. Neuropsych: This patient is capable of making decisions on his own behalf. 6. Skin/Wound Care: Routine skin checks 7. Fluids/Electrolytes/Nutrition: Routine I&O is with follow-up chemistries 8. Hyperlipidemia. Lipitor 9. Hypertension. No present antihypertensives medication. Patient on Prinzide/Zestoretic 20-12.5 mg daily prior to admission. Resume as needed, has diastolic elevation will resume Prinivil, may need to add diuretic in future   Cont current Rx  Length of stay, days: 4  Sonda Primes , MD 08/05/2014, 8:34 AM

## 2014-08-05 NOTE — Progress Notes (Signed)
Physical Therapy Session Note  Patient Details  Name: Brentton Chrisco MRN: [id number] Date of Birth: 1955/03/06  Today's Date: 08/05/2014 PT Individual Time: 1050-1120 PT Individual Time Calculation (min): 30 min   Short Term Goals: Week 1:  PT Short Term Goal 1 (Week 1): pt will perform bed mobility with 0-1 cue for L attention PT Short Term Goal 2 (Week 1): pt will transfer bed>< w/c to L with supervision with 2 or less cues for L attention PT Short Term Goal 3 (Week 1): pt will perform gait x 150' controlled env with close supervision, with 5 or less cues for attention PT Short Term Goal 4 (Week 1): pt will ascend/descend ramp with supervision PT Short Term Goal 5 (Week 1): pt will ascend/descend 12 steps 1 rail with min assist  Skilled Therapeutic Interventions/Progress Updates:    Pt limited most functionally by L inattention. Pt with periods of near falls scooting not realizing L side is nearly off mat. Pt does best with LUE forced use activities, but difficult to progress activities further secondary to pt only being able to cognitively attend to one demand. Pt would continue to benefit from skilled PT services to increase functional mobility.  Therapy Documentation Precautions:  Precautions Precautions: Fall Precaution Comments: impulsive; L neglect Restrictions Weight Bearing Restrictions: No General:   Vital Signs: Therapy Vitals Temp: 98 F (36.7 C) Temp Source: Oral Pulse Rate: 62 Resp: 20 BP: 119/64 mmHg Patient Position (if appropriate): Sitting Oxygen Therapy SpO2: 100 % O2 Device: Not Delivered Pain: Pain Assessment Pain Assessment: No/denies pain Mobility:  Pt performs with Min A with cues for safety, attention, and technique Locomotion : Ambulation Ambulation/Gait Assistance: 4: Min guard;5: Supervision 125'x2 Other Treatments:   Pt educated on rehab plan, safety in mobility, rehab prognosis, and insight training. Pt performs LUE forced use in sitting as  active rest. LUE forced use in anterior weight shifts, reaching for targets with RUE, and lifting large object 2x10. Pt performs transfers x12 in session. Pt performs static and dynamic sitting balance activities including weight shifting, reaching, grasping within functional context. T/S ext and L/S ext and lateral flex soft tissue stretches 2x30". Pt performs dynamic gait holding large swiss ball in addition to gait trial as above Stimuli provided on L side for pt to attend to.    See FIM for current functional status  Therapy/Group: Individual Therapy  Christia Reading 08/05/2014, 4:04 PM

## 2014-08-06 ENCOUNTER — Inpatient Hospital Stay (HOSPITAL_COMMUNITY): Payer: BLUE CROSS/BLUE SHIELD | Admitting: Occupational Therapy

## 2014-08-06 ENCOUNTER — Ambulatory Visit: Payer: BLUE CROSS/BLUE SHIELD

## 2014-08-06 ENCOUNTER — Inpatient Hospital Stay (HOSPITAL_COMMUNITY): Payer: BLUE CROSS/BLUE SHIELD | Admitting: Speech Pathology

## 2014-08-06 ENCOUNTER — Inpatient Hospital Stay (HOSPITAL_COMMUNITY): Payer: BLUE CROSS/BLUE SHIELD | Admitting: Rehabilitation

## 2014-08-06 NOTE — Progress Notes (Signed)
Occupational Therapy Session Note  Patient Details  Name: [name] MRN: [id number] Date of Birth: 22-Feb-1955  Today's Date: 08/06/2014 OT Individual Time: 1500-1530 OT Individual Time Calculation (min): 30 min    Short Term Goals: Week 1:  OT Short Term Goal 1 (Week 1): Pt will perform shower transfer with mod A into and out of shower in order to  decrease level of assist for functional transfers. OT Short Term Goal 2 (Week 1): Pt will perform toilet transfer with min A in order to increase I with functional transfers.  OT Short Term Goal 3 (Week 1): Pt will require no more than steady assist for balance during toileting. OT Short Term Goal 4 (Week 1): Pt will utilize strategies as needed with mod verbal cues or less to attend to L environment during therapy sessions. OT Short Term Goal 5 (Week 1): Pt will perform UB dressing with min A in order to increase I in self care.  Skilled Therapeutic Interventions/Progress Updates:    Engaged in therapeutic activity with focus on Lt attention, forced use of LUE, and increased awareness of deficits.  Pt ambulated to therapy gym with min guard and cues for attention to Lt, incorporating scanning to Lt to read signs.  In therapy gym provided pt stress ball and handout with activities to complete to focus on finger flexion/extension and grasp - as pt's wife had requested a stress ball.  Pt currently unable to sustain attention to task to fully engage in exercises as well as Lt inattention, proving challenging for pt to complete any of the activities on the handout.  Engaged in Connect 4 activity with RUE while weight bearing through LUE with therapist providing physical cues to promote weight bearing.  Therapist and Connect 4 grid placed on pt's Lt to promote scanning to Lt.  Pt easily frustrated during task, especially when asked about deficits and why task would be challenging.  Pt tended to ignore any verbal cues from therapist to further promote  attention to Lt.  Pt returned to room and left seated in recliner with quick release belt in place and all needs in reach.  Therapy Documentation Precautions:  Precautions Precautions: Fall Precaution Comments: impulsive; L neglect Restrictions Weight Bearing Restrictions: No General:   Vital Signs: Therapy Vitals Temp: 98.3 F (36.8 C) Temp Source: Oral Pulse Rate: 72 Resp: 19 BP: 102/60 mmHg Patient Position (if appropriate): Lying Oxygen Therapy SpO2: 99 % O2 Device: Not Delivered Pain: Pain Assessment Pain Assessment: No/denies pain  See FIM for current functional status  Therapy/Group: Individual Therapy  Rosalio Loud 08/06/2014, 3:42 PM

## 2014-08-06 NOTE — Progress Notes (Signed)
Occupational Therapy Session Note  Patient Details  Name: [name] MRN: [id number] Date of Birth: Dec 15, 1955  Today's Date: 08/06/2014 OT Individual Time: 0845-1000 OT Individual Time Calculation (min): 75 min    Short Term Goals: Week 1:  OT Short Term Goal 1 (Week 1): Pt will perform shower transfer with mod A into and out of shower in order to  decrease level of assist for functional transfers. OT Short Term Goal 2 (Week 1): Pt will perform toilet transfer with min A in order to increase I with functional transfers.  OT Short Term Goal 3 (Week 1): Pt will require no more than steady assist for balance during toileting. OT Short Term Goal 4 (Week 1): Pt will utilize strategies as needed with mod verbal cues or less to attend to L environment during therapy sessions. OT Short Term Goal 5 (Week 1): Pt will perform UB dressing with min A in order to increase I in self care.  Skilled Therapeutic Interventions/Progress Updates:    Pt seen for OT therapy focusing on ADL re-training, neuromuscular re-education, and cognitive retraining. Pt sitting EOB upon arrival eating breakfast with family present. Pt transferred to w/c with supervision via squat pivot transfer. Pt bathed seated in w/c at the sink, pt with mild perseveration during tasks, and impulsivity/ decreased safety awareness when standing. Pt declined perineal/ buttock hygiene, and became slightly agitated following VCs from therapist. Pt donned UB clothing with VCs and increased time. Pt verbalized hemi dressing technique, however, unsuccessfully threaded L UE into shirt and donned remainder of shirt without L UE threaded. Pt ignored VCs from therapist for dressing technique. With increased time and VCs for attention to L side, pt able to dress UB without physical assist. TED hose donned by therapist and pt donned B slip on shoes mod I. Pt completed grooming at the sink with assist to place containers in L UE to use as stabilizer to open  with R. Pt them ambulated throughout unit with close supervision. Verbal directions given for how to get to therapy gym, however, pt did not follow directions and had excuses for reasons he went the way he did. In therapy gym, pt completed 9 hole peg test, requiring max VCs and redirection to task to complete. Pt able to complete task using R UE, however, unable to complete using L UE stating he couldn't feel pegs. VCs provided to use visual input to facilitate hand eye coordination, however, pt became frustrated and unable to maintain attention to task to complete. Pt then completed standing bean bag toss, matching bean bags to colors in order to increase functional static and dynamic standing balance and functional attention during task. Pt able to weight shift and turn to retrieve bean bags and tossed bags correctly 100% of time matching colors. Pt ambulated to pick up bean bags, able to bend down to floor to pick up bags without LOB episode. Pt then completed functional cognitive task, walking throughout unit to identify words with various letters of the alphabet. Pt able to recall what letter he was looking for, however, displayed impaired ability to multitask while managing stimulating environment. Pt required mod-max VCs to attend to surround to identify letters. Pt returned to room at end of session, and quickly returned to bed. Pt's caregivers present upon arrival, pleased with his physical process ut inquiring regarding his cognition. Pt's family educated regarding goals of therapy including attention span during functional tasks, standing on pt's L side when interacting, and his need for assist.  Pt left in supine at end of session, RN made aware, and caregivers present.   Therapy Documentation Precautions:  Precautions Precautions: Fall Precaution Comments: impulsive; L neglect Restrictions Weight Bearing Restrictions: No Pain: Pain Assessment Pain Assessment: No/denies pain  See FIM for  current functional status  Therapy/Group: Individual Therapy  Lewis, Natalyn Szymanowski C 08/06/2014, 7:14 AM

## 2014-08-06 NOTE — Progress Notes (Signed)
Speech Language Pathology Daily Session Note  Patient Details  Name: [name] MRN: [id number] Date of Birth: [birth year]  Today's Date: 08/06/2014 SLP Individual Time: 1100-1210 SLP Individual Time Calculation (min): 70 min  Short Term Goals: Week 1: SLP Short Term Goal 1 (Week 1): Pt will attend to the left of his body and/or the environment during functional tasks with min-mod assist verbal and visual cues.  SLP Short Term Goal 2 (Week 1): Pt will complete self care and/or home management tasks with min-mod assist verbal, visual, and tactile cues for functional problem solving  SLP Short Term Goal 3 (Week 1): Pt will consume his currently prescribed diet with supervision cues for use of swallowing precautions with minimal overt s/s of aspiration SLP Short Term Goal 4 (Week 1): Pt will consume trials of regular textures with supervision cues to monitor and correct left sided buccal residue over 3 targeted sessions prior to diet advancement.  SLP Short Term Goal 5 (Week 1): Pt will sustain attention to a basic, familiar task for 3-5 minute intervals with min-mod verbal, visual and tactile cues for redirection.  SLP Short Term Goal 6 (Week 1): Pt will initiate a functional task in a timely manner over 50% of opportunities wtih mod assist verbal cues.    Skilled Therapeutic Interventions:  Pt was seen for skilled ST targeting cognitive goals.  Upon arrival, pt was reclined in bed, asleep, but easily awakened to voice and light touch.  Pt ambulated to the ST treatment room with min-mod verbal and tactile cues for awareness of obstacles on the left.  SLP facilitated the session with a medication management task targeting functional problem solving for home management.  Pt required mod-max assist verbal cues for initiation and sequencing to load pills of varying dosages and frequencies into a pill box due to impulsivity, perseveration, and poor error awareness.  Upon return to room, pt's wife and  mother in law were present.  Therefore, SLP provided skilled instruction recommending that pt have assistance for medication and financial management due to cognitive deficits.  Pt and pt's mother also requested training on providing supervision during meals.  SLP explained and demonstrated cuing strategies to facilitate rate and portion control during meals and explained rationale behind currently recommended diet and swallowing precautions. SLP will sign family off to supervise during meals but will continue to monitor and educate for best use of compensatory strategies.  Continue per current plan of care.    FIM:  Comprehension Comprehension Mode: Auditory Comprehension: 5-Understands complex 90% of the time/Cues < 10% of the time Expression Expression Mode: Verbal Expression: 5-Expresses basic 90% of the time/requires cueing < 10% of the time. Social Interaction Social Interaction: 3-Interacts appropriately 50 - 74% of the time - May be physically or verbally inappropriate. Problem Solving Problem Solving: 3-Solves basic 50 - 74% of the time/requires cueing 25 - 49% of the time Memory Memory: 3-Recognizes or recalls 50 - 74% of the time/requires cueing 25 - 49% of the time  Pain Pain Assessment Pain Assessment: No/denies pain  Therapy/Group: Individual Therapy  Corrie Brannen, Melanee Spry 08/06/2014, 12:41 PM

## 2014-08-06 NOTE — Progress Notes (Signed)
Physical Therapy Session Note  Patient Details  Name: [name] MRN: [id number] Date of Birth: 21-Nov-1955  Today's Date: 08/06/2014 PT Individual Time: 1615-1700 PT Individual Time Calculation (min): 45 min   Short Term Goals: Week 1:  PT Short Term Goal 1 (Week 1): pt will perform bed mobility with 0-1 cue for L attention PT Short Term Goal 2 (Week 1): pt will transfer bed>< w/c to L with supervision with 2 or less cues for L attention PT Short Term Goal 3 (Week 1): pt will perform gait x 150' controlled env with close supervision, with 5 or less cues for attention PT Short Term Goal 4 (Week 1): pt will ascend/descend ramp with supervision PT Short Term Goal 5 (Week 1): pt will ascend/descend 12 steps 1 rail with min assist  Skilled Therapeutic Interventions/Progress Updates:   Pt received sitting in recliner, agreeable to therapy session.  Skilled session focused on gait in controlled and busy environment, gait with dual tasking to challenge balance, attention, and problem solving skills, high level balance, scanning L environment during gait, and floor recovery.  Performed scanning task in therapy gym locating horse shoes scanning R and L with max verbal cues to recall how many and requiring pt to make several trips up/down floor to scan for them.  Performed ball toss with RUE while naming foods>name animals in alphabetical order while ambulating.  Requires S for gait, however requires max to total A cues to continue with task, recall which letter, etc.  Progressed to high level balance with cone taps on compliant surface>cone tipping/setting back up with alternating LEs.  Requires up to max A to correct LOB to the L with very little stepping strategy noted.  Performed floor recovery at min A level with max verbal cues on when to call 911 vs when to attempt as well as safety of LUE/LE during task.  Ended with another scanning task in hallway, again requires S for gait, however requires max cues  to gather post its in order.  Ambulated back to room and left in w/c with quick release belt donned and all needs in reach.    Therapy Documentation Precautions:  Precautions Precautions: Fall Precaution Comments: impulsive; L neglect Restrictions Weight Bearing Restrictions: No   Vital Signs: Therapy Vitals Temp: 98.3 F (36.8 C) Temp Source: Oral Pulse Rate: 72 Resp: 19 BP: 102/60 mmHg Patient Position (if appropriate): Lying Oxygen Therapy SpO2: 99 % O2 Device: Not Delivered Pain: Pt with no c/o pain during session.    See FIM for current functional status  Therapy/Group: Individual Therapy  Vista Deck 08/06/2014, 4:41 PM

## 2014-08-06 NOTE — Progress Notes (Signed)
59 y.o. right handed male with history of hypertension as well as hyperlipidemia with right MCA territory infarct on 07/25/2014 with single right M2 branch occlusion identified just beyond the bifurcation and occluded right ICA origin. Underwent loop recorder placement per Dr. Ladona Ridgel and discharge to home on aspirin as well as Plavix 07/27/2014 independent without assistive device. Patient was independent prior to initial admission 07/25/2014 working full time living with his wife. Presented 07/29/2014 with increasing left-sided weakness and slurred speech. MRI of the brain showed acute moderate right middle cerebral artery territory infarct further involvement of the operculum, new involvement of the insula and right basal ganglia propagated from prior imaging. Recent echocardiogram with ejection fraction of 70% grade 1 diastolic dysfunction  Subjective/Complaints: Sitting up , eating, impulsive with eating, needs cues to stop for a minute, no new issues per RN, obvious neglect of Left ROS denies any chest pains or shortness of breath no coughing with meals, no pain complaints in the joints.    Objective: Vital Signs: Blood pressure 105/74, pulse 69, temperature 98.5 F (36.9 C), temperature source Oral, resp. rate 20, SpO2 99 %. No results found. Results for orders placed or performed during the hospital encounter of 08/01/14 (from the past 72 hour(s))  Glucose, capillary     Status: None   Collection Time: 08/03/14 11:35 AM  Result Value Ref Range   Glucose-Capillary 97 65 - 99 mg/dL  Glucose, capillary     Status: Abnormal   Collection Time: 08/03/14  4:25 PM  Result Value Ref Range   Glucose-Capillary 130 (H) 65 - 99 mg/dL  Glucose, capillary     Status: None   Collection Time: 08/03/14  9:22 PM  Result Value Ref Range   Glucose-Capillary 89 65 - 99 mg/dL  Glucose, capillary     Status: None   Collection Time: 08/04/14  6:52 AM  Result Value Ref Range   Glucose-Capillary 85 65 - 99  mg/dL  Glucose, capillary     Status: Abnormal   Collection Time: 08/04/14 11:17 AM  Result Value Ref Range   Glucose-Capillary 108 (H) 65 - 99 mg/dL  Glucose, capillary     Status: Abnormal   Collection Time: 08/04/14  5:13 PM  Result Value Ref Range   Glucose-Capillary 130 (H) 65 - 99 mg/dL  Glucose, capillary     Status: Abnormal   Collection Time: 08/04/14  9:00 PM  Result Value Ref Range   Glucose-Capillary 113 (H) 65 - 99 mg/dL     HEENT: normal Cardio: RRR Resp: CTA B/L and unlabored GI: BS positive Extremity:  Pulses positive and No Edema Skin:   Intact and Other IV site ok Neuro: Lethargic, Abnormal Sensory reduced sensation to pinch in LUE, Abnormal Motor 2- Left Bi, tri, 0/5 in fingers and wrist, 3- in LLE and Dysarthric Musc/Skel:  Other no pain with UE or LE ROM Gen NAD   Assessment/Plan: 1. Functional deficits secondary to RIght MCA infarct with left hemiparesis which require 3+ hours per day of interdisciplinary therapy in a comprehensive inpatient rehab setting. Physiatrist is providing close team supervision and 24 hour management of active medical problems listed below. Physiatrist and rehab team continue to assess barriers to discharge/monitor patient progress toward functional and medical goals.  FIM: FIM - Bathing Bathing Steps Patient Completed: Chest, Left Arm, Abdomen, Front perineal area, Buttocks, Right upper leg, Left upper leg, Right lower leg (including foot), Left lower leg (including foot) Bathing: 4: Min-Patient completes 8-9 61f 10 parts or  75+ percent  FIM - Upper Body Dressing/Undressing Upper body dressing/undressing steps patient completed: Put head through opening of pull over shirt/dress, Thread/unthread right sleeve of pullover shirt/dresss Upper body dressing/undressing: 3: Mod-Patient completed 50-74% of tasks FIM - Lower Body Dressing/Undressing Lower body dressing/undressing steps patient completed: Thread/unthread right underwear  leg, Thread/unthread right pants leg, Thread/unthread left pants leg, Pull pants up/down, Don/Doff right shoe, Don/Doff left shoe Lower body dressing/undressing: 3: Mod-Patient completed 50-74% of tasks  FIM - Toileting Toileting steps completed by patient: Adjust clothing prior to toileting, Performs perineal hygiene, Adjust clothing after toileting Toileting: 4: Steadying assist  FIM - Diplomatic Services operational officer Devices: Grab bars Toilet Transfers: 4-From toilet/BSC: Min A (steadying Pt. > 75%), 4-To toilet/BSC: Min A (steadying Pt. > 75%)  FIM - Bed/Chair Transfer Bed/Chair Transfer Assistive Devices: Arm rests Bed/Chair Transfer: 5: Supine > Sit: Supervision (verbal cues/safety issues), 4: Sit > Supine: Min A (steadying pt. > 75%/lift 1 leg), 4: Bed > Chair or W/C: Min A (steadying Pt. > 75%), 4: Chair or W/C > Bed: Min A (steadying Pt. > 75%)  FIM - Locomotion: Wheelchair Distance: 150 Locomotion: Wheelchair: 1: Total Assistance/staff pushes wheelchair (Pt<25%) FIM - Locomotion: Ambulation Locomotion: Ambulation Assistive Devices:  (no AD) Ambulation/Gait Assistance: 4: Min guard, 5: Supervision Locomotion: Ambulation: 4: Travels 150 ft or more with minimal assistance (Pt.>75%)  Comprehension Comprehension Mode: Auditory Comprehension: 5-Understands complex 90% of the time/Cues < 10% of the time  Expression Expression Mode: Verbal Expression: 5-Expresses basic 90% of the time/requires cueing < 10% of the time.  Social Interaction Social Interaction: 3-Interacts appropriately 50 - 74% of the time - May be physically or verbally inappropriate.  Problem Solving Problem Solving Mode: Asleep Problem Solving: 3-Solves basic 50 - 74% of the time/requires cueing 25 - 49% of the time  Memory Memory: 3-Recognizes or recalls 50 - 74% of the time/requires cueing 25 - 49% of the time  Medical Problem List and Plan: 1. Functional deficits secondary to extension right  MCA infarct embolic from right ICA occlusion 2.  DVT Prophylaxis/Anticoagulation: Subcutaneous heparin. Monitor platelet counts of any signs of bleeding 3. Pain Management: Tylenol as needed 4. Tobacco abuse. Continue Nicoderm patch. Provide counseling 5. Neuropsych: This patient is capable of making decisions on his own behalf. 6. Skin/Wound Care: Routine skin checks 7. Fluids/Electrolytes/Nutrition: Routine I&O is with follow-up chemistries 8. Hyperlipidemia. Lipitor 9. Hypertension. No present antihypertensives medication. Patient on Prinzide/Zestoretic 20-12.5 mg daily prior to admission. Resume as needed, has diastolic elevation will resume Prinivil, may need to add diuretic in future   LOS (Days) 5 A FACE TO FACE EVALUATION WAS PERFORMED  Kellie Chisolm E 08/06/2014, 8:33 AM

## 2014-08-07 ENCOUNTER — Inpatient Hospital Stay (HOSPITAL_COMMUNITY): Payer: BLUE CROSS/BLUE SHIELD | Admitting: Occupational Therapy

## 2014-08-07 ENCOUNTER — Inpatient Hospital Stay (HOSPITAL_COMMUNITY): Payer: BLUE CROSS/BLUE SHIELD | Admitting: Speech Pathology

## 2014-08-07 ENCOUNTER — Inpatient Hospital Stay (HOSPITAL_COMMUNITY): Payer: BLUE CROSS/BLUE SHIELD | Admitting: Physical Therapy

## 2014-08-07 DIAGNOSIS — R414 Neurologic neglect syndrome: Secondary | ICD-10-CM | POA: Diagnosis present

## 2014-08-07 DIAGNOSIS — G8114 Spastic hemiplegia affecting left nondominant side: Secondary | ICD-10-CM | POA: Diagnosis present

## 2014-08-07 NOTE — Progress Notes (Signed)
Speech Language Pathology Daily Session Note  Patient Details  Name: [name] MRN: [id number] Date of Birth: 1955/08/28  Today's Date: 08/07/2014 SLP Individual Time: 1100-1200 SLP Individual Time Calculation (min): 60 min  Short Term Goals: Week 1: SLP Short Term Goal 1 (Week 1): Pt will attend to the left of his body and/or the environment during functional tasks with min-mod assist verbal and visual cues.  SLP Short Term Goal 2 (Week 1): Pt will complete self care and/or home management tasks with min-mod assist verbal, visual, and tactile cues for functional problem solving  SLP Short Term Goal 3 (Week 1): Pt will consume his currently prescribed diet with supervision cues for use of swallowing precautions with minimal overt s/s of aspiration SLP Short Term Goal 4 (Week 1): Pt will consume trials of regular textures with supervision cues to monitor and correct left sided buccal residue over 3 targeted sessions prior to diet advancement.  SLP Short Term Goal 5 (Week 1): Pt will sustain attention to a basic, familiar task for 3-5 minute intervals with min-mod verbal, visual and tactile cues for redirection.  SLP Short Term Goal 6 (Week 1): Pt will initiate a functional task in a timely manner over 50% of opportunities wtih mod assist verbal cues.    Skilled Therapeutic Interventions:  Pt was seen for skilled ST targeting goals for dysphagia and cognition.  Upon arrival, pt was seated upright in recliner with quick release belt donned and leg kicked up on the bed over the bed rail.  Pt ambulated to ST treatment room with min verbal cues for awareness of obstacles on the left.  SLP facilitated the session with a basic card game targeting initiation, sustained attention, and functional problem solving.  Pt required overall mod-max assist verbal, visual, and tactile cues to plan and execute a problem solving strategy during the abovementioned tasks, mod assist verbal cues for initiation.  He was  able to sustain his attention to task for 1-2 minute intervals with mod assist multimodal cues.  SLP also facilitated the session with a trial meal tray of regular textures to continue working towards diet progression.  Pt consumed regular textures and thin liquids with overall mod assist verbal cues to initiate self feeding and to monitor and correct anterior labial loss of materials.  Pt cleared mild left sided buccal residue with min verbal cues for use of liquid wash.  Pt with intermittently wet vocal quality following consecutive cup sips of thin liquids which he corrected with min verbal cues for use of throat clear and rate/portion control.  Recommend that pt's diet be upgraded to regular textures with continued thin liquids and full supervision for use of swallowing precautions.  Continue per current plan of care.     FIM:  Comprehension Comprehension Mode: Auditory Comprehension: 5-Understands complex 90% of the time/Cues < 10% of the time Expression Expression Mode: Verbal Expression: 5-Expresses basic 90% of the time/requires cueing < 10% of the time. Social Interaction Social Interaction: 3-Interacts appropriately 50 - 74% of the time - May be physically or verbally inappropriate. Problem Solving Problem Solving: 3-Solves basic 50 - 74% of the time/requires cueing 25 - 49% of the time Memory Memory: 3-Recognizes or recalls 50 - 74% of the time/requires cueing 25 - 49% of the time FIM - Eating Eating Activity: 5: Needs verbal cues/supervision  Pain Pain Assessment Pain Assessment: No/denies pain  Therapy/Group: Individual Therapy  Elim Economou, Melanee Spry 08/07/2014, 5:02 PM

## 2014-08-07 NOTE — Progress Notes (Signed)
Occupational Therapy Session Note  Patient Details  Name: [name] MRN: [id number] Date of Birth: Jul 12, 1955  Today's Date: 08/07/2014 OT Individual Time: 0845-1000 OT Individual Time Calculation (min): 75 min    Short Term Goals: Week 1:  OT Short Term Goal 1 (Week 1): Pt will perform shower transfer with mod A into and out of shower in order to  decrease level of assist for functional transfers. OT Short Term Goal 2 (Week 1): Pt will perform toilet transfer with min A in order to increase I with functional transfers.  OT Short Term Goal 3 (Week 1): Pt will require no more than steady assist for balance during toileting. OT Short Term Goal 4 (Week 1): Pt will utilize strategies as needed with mod verbal cues or less to attend to L environment during therapy sessions. OT Short Term Goal 5 (Week 1): Pt will perform UB dressing with min A in order to increase I in self care.  Skilled Therapeutic Interventions/Progress Updates:    Engaged in therapeutic activity with focus on LUE NMR and visual scanning to Lt during functional mobility.  Pt in bed asleep upon arrival, initially refusing therapy.  Pt reporting fatigue and requesting therapist to return at a later time.  Therapist returned after 15 minutes and speaking with SWK regarding D/C planning and need to schedule family education, with pt willing to participate in treatment session.  Pt donned shoes with setup assist then ambulated to therapy gym with supervision/cues for attention to Lt.  Engaged in LUE NMR task in sitting with focus on scanning to Lt to obtain large pegs with Lt hand, required cues to visually attend to Lt hand while completing grasp/release of pegs with pt dropping approx 40% of pegs.  Pt requested to ambulate outside.  Ambulated to elevator with supervision and cues to scan to Lt to read signs on Lt with pt reading only last names on signs - omitting first name further on Lt.  Ambulated > 1000 feet with supervision/cues  for path finding and to attend to Lt environment.  Ambulated up/down 5 steps x2 with close supervision and cues for step together pattern.  Pt returned to room and bed at end of session.  Therapy Documentation Precautions:  Precautions Precautions: Fall Precaution Comments: impulsive; L neglect Restrictions Weight Bearing Restrictions: No Pain: Pain Assessment Pain Assessment: No/denies pain  See FIM for current functional status  Therapy/Group: Individual Therapy  Rosalio Loud 08/07/2014, 9:54 AM

## 2014-08-07 NOTE — Progress Notes (Signed)
Physical Therapy Session Note  Patient Details  Name: [name] MRN: [id number] Date of Birth: September 23, 1955  Today's Date: 08/07/2014 PT Individual Time: 1000-1058 PT Individual Time Calculation (min): 58 min   Short Term Goals: Week 1:  PT Short Term Goal 1 (Week 1): pt will perform bed mobility with 0-1 cue for L attention PT Short Term Goal 2 (Week 1): pt will transfer bed>< w/c to L with supervision with 2 or less cues for L attention PT Short Term Goal 3 (Week 1): pt will perform gait x 150' controlled env with close supervision, with 5 or less cues for attention PT Short Term Goal 4 (Week 1): pt will ascend/descend ramp with supervision PT Short Term Goal 5 (Week 1): pt will ascend/descend 12 steps 1 rail with min assist  Skilled Therapeutic Interventions/Progress Updates:    Pt received supine in bed with no c/o of pain and agreeable to treatment. Pt instructed in standing balance activities while playing horseshoes and basketball, requiring dynamic UE reaching outside BOS, weight shifting, anticipatory and reactive balance strategies, also incorporating bimanual tasks, L attention, and sustained attention. Pt instructed in gait training in hallways of rehab floor, pt cued to navigate to commonly used areas of the floor including gym and room, patient requires variable mod/max cues to navigate back to room with use of room number signs. Pt instructed in stair ascent/descent of 24 stairs (2 flights of 12 stairs) with one handrail and supervision/minA and cues for forward facing posture. Pt demonstrates mild LOB when performing cognitive dual task during gait and stair training; requires cues to maintain balance and return attention to task. Pt returned to room and seated in recliner with QR belt intact and all needs within reach.   Therapy Documentation Precautions:  Precautions Precautions: Fall Precaution Comments: impulsive; L neglect Restrictions Weight Bearing Restrictions:  No Pain: Pain Assessment Pain Assessment: No/denies pain Locomotion : Ambulation Ambulation/Gait Assistance: 5: Supervision   See FIM for current functional status  Therapy/Group: Individual Therapy  Vista Lawman 08/07/2014, 12:09 PM

## 2014-08-07 NOTE — Progress Notes (Signed)
59 y.o. right handed male with history of hypertension as well as hyperlipidemia with right MCA territory infarct on 07/25/2014 with single right M2 branch occlusion identified just beyond the bifurcation and occluded right ICA origin. Underwent loop recorder placement per Dr. Ladona Ridgel and discharge to home on aspirin as well as Plavix 07/27/2014 independent without assistive device. Patient was independent prior to initial admission 07/25/2014 working full time living with his wife. Presented 07/29/2014 with increasing left-sided weakness and slurred speech. MRI of the brain showed acute moderate right middle cerebral artery territory infarct further involvement of the operculum, new involvement of the insula and right basal ganglia propagated from prior imaging. Recent echocardiogram with ejection fraction of 70% grade 1 diastolic dysfunction  Subjective/Complaints: Pt without new issues overnite, good appetitie ROS denies any chest pains or shortness of breath no coughing with meals, no pain complaints in the joints, per RN continent of bowel and bladder.    Objective: Vital Signs: Blood pressure 118/69, pulse 77, temperature 98.5 F (36.9 C), temperature source Oral, resp. rate 18, SpO2 98 %. No results found. Results for orders placed or performed during the hospital encounter of 08/01/14 (from the past 72 hour(s))  Glucose, capillary     Status: Abnormal   Collection Time: 08/04/14 11:17 AM  Result Value Ref Range   Glucose-Capillary 108 (H) 65 - 99 mg/dL  Glucose, capillary     Status: Abnormal   Collection Time: 08/04/14  5:13 PM  Result Value Ref Range   Glucose-Capillary 130 (H) 65 - 99 mg/dL  Glucose, capillary     Status: Abnormal   Collection Time: 08/04/14  9:00 PM  Result Value Ref Range   Glucose-Capillary 113 (H) 65 - 99 mg/dL     HEENT: normal Cardio: RRR Resp: CTA B/L and unlabored GI: BS positive Extremity:  Pulses positive and No Edema Skin:   Intact and Other IV site  ok Neuro: Lethargic, Abnormal Sensory reduced sensation to pinch in LUE, Abnormal Motor 2- Left Bi, tri, 0/5 in fingers and wrist, 3- in LLE and Dysarthric Musc/Skel:  Other no pain with UE or LE ROM Gen NAD   Assessment/Plan: 1. Functional deficits secondary to RIght MCA infarct with left hemiparesis which require 3+ hours per day of interdisciplinary therapy in a comprehensive inpatient rehab setting. Physiatrist is providing close team supervision and 24 hour management of active medical problems listed below. Physiatrist and rehab team continue to assess barriers to discharge/monitor patient progress toward functional and medical goals. Team conf in am FIM: FIM - Bathing Bathing Steps Patient Completed: Chest, Left Arm, Abdomen, Right Arm Bathing: 5: Supervision: Safety issues/verbal cues  FIM - Upper Body Dressing/Undressing Upper body dressing/undressing steps patient completed: Put head through opening of pull over shirt/dress, Thread/unthread right sleeve of pullover shirt/dresss, Thread/unthread left sleeve of pullover shirt/dress, Pull shirt over trunk Upper body dressing/undressing: 5: Supervision: Safety issues/verbal cues FIM - Lower Body Dressing/Undressing Lower body dressing/undressing steps patient completed: Don/Doff right shoe, Don/Doff left shoe Lower body dressing/undressing: 4: Min-Patient completed 75 plus % of tasks  FIM - Toileting Toileting steps completed by patient: Adjust clothing prior to toileting, Performs perineal hygiene, Adjust clothing after toileting Toileting: 4: Steadying assist  FIM - Diplomatic Services operational officer Devices: Grab bars Toilet Transfers: 4-From toilet/BSC: Min A (steadying Pt. > 75%), 4-To toilet/BSC: Min A (steadying Pt. > 75%)  FIM - Bed/Chair Transfer Bed/Chair Transfer Assistive Devices: Arm rests Bed/Chair Transfer: 5: Supine > Sit: Supervision (verbal cues/safety issues),  4: Sit > Supine: Min A (steadying pt. >  75%/lift 1 leg), 4: Bed > Chair or W/C: Min A (steadying Pt. > 75%), 4: Chair or W/C > Bed: Min A (steadying Pt. > 75%)  FIM - Locomotion: Wheelchair Distance: 150 Locomotion: Wheelchair: 1: Total Assistance/staff pushes wheelchair (Pt<25%) FIM - Locomotion: Ambulation Locomotion: Ambulation Assistive Devices: Other (comment) (no AD) Ambulation/Gait Assistance: 5: Supervision Locomotion: Ambulation: 5: Travels 150 ft or more with supervision/safety issues  Comprehension Comprehension Mode: Auditory Comprehension: 5-Understands complex 90% of the time/Cues < 10% of the time  Expression Expression Mode: Verbal Expression: 5-Expresses basic 90% of the time/requires cueing < 10% of the time.  Social Interaction Social Interaction: 3-Interacts appropriately 50 - 74% of the time - May be physically or verbally inappropriate.  Problem Solving Problem Solving Mode: Asleep Problem Solving: 3-Solves basic 50 - 74% of the time/requires cueing 25 - 49% of the time  Memory Memory: 3-Recognizes or recalls 50 - 74% of the time/requires cueing 25 - 49% of the time  Medical Problem List and Plan: 1. Functional deficits secondary to extension right MCA infarct embolic from right ICA occlusion 2.  DVT Prophylaxis/Anticoagulation: Subcutaneous heparin. Monitor platelet counts of any signs of bleeding 3. Pain Management: Tylenol as needed 4. Tobacco abuse. Continue Nicoderm patch. Provide counseling 5. Neuropsych: This patient is capable of making decisions on his own behalf. 6. Skin/Wound Care: Routine skin checks 7. Fluids/Electrolytes/Nutrition: Routine I&O is with follow-up chemistries 8. Hyperlipidemia. Lipitor 9. Hypertension. No present antihypertensives medication. Patient on Prinzide/Zestoretic 20-12.5 mg daily prior to admission. Resume as needed, has diastolic elevation will resume Prinivil, may need to add diuretic in future   LOS (Days) 6 A FACE TO FACE EVALUATION WAS  PERFORMED  Shiann Kam E 08/07/2014, 8:14 AM

## 2014-08-08 ENCOUNTER — Inpatient Hospital Stay (HOSPITAL_COMMUNITY): Payer: BLUE CROSS/BLUE SHIELD | Admitting: Occupational Therapy

## 2014-08-08 ENCOUNTER — Inpatient Hospital Stay (HOSPITAL_COMMUNITY): Payer: BLUE CROSS/BLUE SHIELD | Admitting: Speech Pathology

## 2014-08-08 ENCOUNTER — Inpatient Hospital Stay (HOSPITAL_COMMUNITY): Payer: BLUE CROSS/BLUE SHIELD | Admitting: Physical Therapy

## 2014-08-08 DIAGNOSIS — I10 Essential (primary) hypertension: Secondary | ICD-10-CM

## 2014-08-08 DIAGNOSIS — G8110 Spastic hemiplegia affecting unspecified side: Secondary | ICD-10-CM

## 2014-08-08 DIAGNOSIS — R414 Neurologic neglect syndrome: Secondary | ICD-10-CM

## 2014-08-08 DIAGNOSIS — I63511 Cerebral infarction due to unspecified occlusion or stenosis of right middle cerebral artery: Secondary | ICD-10-CM

## 2014-08-08 NOTE — Progress Notes (Signed)
Social Work Patient ID: [name], male   DOB: 1955-12-16, 59 y.o.   MRN: [id number] Met with pt, wife and pt's Mom who are here to attend therapies with pt.  Have informed of team conference goals-supervision level due to inattention and attention issues. Target discharge date 6/24.  Discussed OP therapies recommended by team.  Getting good return in his arm. Pt is impulsive and family is aware of this. Mom checking to see if has a tub bench And will get bedside commode if needed. Check with tomorrow regarding equipment needs.

## 2014-08-08 NOTE — Patient Care Conference (Signed)
Inpatient RehabilitationTeam Conference and Plan of Care Update Date: 08/08/2014   Time: 10;55 AM    Patient Name: [name]      Medical Record Number: [id number]  Date of Birth: [birth year] Sex: Male         Room/Bed: 4W20C/4W20C-01 Payor Info: Payor: BLUE CROSS BLUE SHIELD / Plan: BCBS OTHER / Product Type: *No Product type* /    Admitting Diagnosis: COGNITIVE DEFICTS IMPULSIVITY  Admit Date/Time:  08/01/2014  4:51 PM Admission Comments: No comment available   Primary Diagnosis:  Left spastic hemiparesis Principal Problem: Left spastic hemiparesis  Patient Active Problem List   Diagnosis Date Noted  . Left spastic hemiparesis 08/07/2014  . Left-sided neglect 08/07/2014  . Right middle cerebral artery stroke 08/01/2014  . Stroke with cerebral ischemia   . CVA (cerebral vascular accident) 07/30/2014  . Tobacco dependence   . Carotid artery occlusion with infarction   . ETOH abuse   . Acute thrombotic stroke 07/25/2014  . CVA (cerebral infarction) 07/25/2014  . Acute ischemic stroke   . Essential hypertension   . Hyperlipidemia   . Tobacco abuse   . Essential hypertension, benign 10/11/2013  . Mixed hyperlipidemia 10/11/2013    Expected Discharge Date: Expected Discharge Date: 08/10/14  Team Members Present: Physician leading conference: Dr. Claudette Laws Social Worker Present: Dossie Der, LCSW Nurse Present: Carlean Purl, RN PT Present: Edman Circle, PT;Caroline Adriana Simas, PT;Other (comment) Luanne Bras Tygieski-PT) OT Present: Rosalio Loud, OT;Julia Saguier, Heath Lark, OT SLP Present: Jackalyn Lombard, SLP PPS Coordinator present : Tora Duck, RN, CRRN     Current Status/Progress Goal Weekly Team Focus  Medical   has not tried bathing with therapy, very impulsive  reduce fall risk  left neglect, family teaching   Bowel/Bladder     Cont B & B        Swallow/Nutrition/ Hydration   upgraded to regular textures and thin liquids   supervision   toleration of upgraded  consistencies and use of swallowing precautions    ADL's   supervision bathing and UB dressing, min assist LB dressing, supervision transfers and ambulation without AD.  Requires mod-max cues for Lt attention and safety  supervision overall  pt and family education, bathroom transfers, LUE NMR and Lt attention   Mobility   Supervision for all mobility; poor L attention, standing balance, and safety with dual-task conditions  Bed mobility modI; transfers, gait, stairs all supervision   L attention, dynamic standing balance, dual-task, activity tolerance   Communication   minimal dysarthria   mod I  intelligibility in conversations    Safety/Cognition/ Behavioral Observations  mod-max assist verbal cues for basic, familiar tasks  min assist   completion of family education prior to discharge, continue to address left inattention, awareness of deficits, functional problem solving    Pain     no pain issues        Skin     no skin issues           *See Care Plan and progress notes for long and short-term goals.  Barriers to Discharge: see above    Possible Resolutions to Barriers:  Plan D/C in 2 d after family training    Discharge Planning/Teaching Needs:  HOme with wife and his Mom to assist-need family education due to pt looks like getting close to DC goals      Team Discussion:  Pt's goals-supervision due to inattention, impulsivity and attention to task. Good return in arm. Upgraded diet to regular.  Need family education with wife and his mom before discharge.  Revisions to Treatment Plan:  Upgraded diet to regular    Continued Need for Acute Rehabilitation Level of Care: The patient requires daily medical management by a physician with specialized training in physical medicine and rehabilitation for the following conditions: Daily direction of a multidisciplinary physical rehabilitation program to ensure safe treatment while eliciting the highest outcome that is of practical  value to the patient.: Yes Daily medical management of patient stability for increased activity during participation in an intensive rehabilitation regime.: Yes Daily analysis of laboratory values and/or radiology reports with any subsequent need for medication adjustment of medical intervention for : Neurological problems;Other  [name] 08/08/2014, 3:04 PM

## 2014-08-08 NOTE — Progress Notes (Signed)
Occupational Therapy Session Note  Patient Details  Name: [name] MRN: [id number] Date of Birth: [birth year]  Today's Date: 08/08/2014 OT Individual Time: 1117-1202 and 1400-1455 OT Individual Time Calculation (min): 45 min and 55 min   Short Term Goals: Week 1:  OT Short Term Goal 1 (Week 1): Pt will perform shower transfer with mod A into and out of shower in order to  decrease level of assist for functional transfers. OT Short Term Goal 2 (Week 1): Pt will perform toilet transfer with min A in order to increase I with functional transfers.  OT Short Term Goal 3 (Week 1): Pt will require no more than steady assist for balance during toileting. OT Short Term Goal 4 (Week 1): Pt will utilize strategies as needed with mod verbal cues or less to attend to L environment during therapy sessions. OT Short Term Goal 5 (Week 1): Pt will perform UB dressing with min A in order to increase I in self care.  Skilled Therapeutic Interventions/Progress Updates:    1) Engaged in ADL retraining with focus on functional transfers, Lt attention, and functional use of LUE during self-care tasks.  [name] and mother present, required increased time to encourage pt to engage in bathing at shower level.  Pt ambulated to room shower with supervision and completed bathing at sit > stand level with supervision and cues to wash RUE.  Pt spontaneously scanned to Lt to obtain soap and scrub brush (wife reports these things are located on Lt at home).  Pt dropped scrub brush x2 when using LUE, requiring cues to visually attend to task to increase success.  Pt dressed at sit > stand level with mod verbal cues for technique as pt donned shirt without threading LUE, then requiring cues for problem solving with pants.  Pt attempted to apply deodorant with LUE, requiring cues to ensure proper grasp and visually attending to task.  Pt left seated upright in recliner with wife and mother present.  [name] reports she has  access to a shower seat, encouraged her to investigate if it is a tub transfer bench as he will need it to come over tub ledge to increase safety with transfer.  2) Engaged in therapeutic activity with focus on bathroom transfers and necessary equipment as well as activity tolerance and Lt attention in community environment.  Pt ambulated to ADL apt with wife and mother observing.  Engaged in education on tub transfer bench and technique for transfer to increase safety, again reiterated need for transfer bench.  [name] reports she also has access to a [facility] and will obtain that.  Recommend use of BSC as option for nighttime to increase safety as recommending 24 hr supervision.  Provided pt with bath mit to increase functional use of LUE during bathing tasks while not worrying about dropping wash cloth.  Pt demonstrated donning bath mit and simulated bathing with improved use of LUE.  Pt spontaneously scanned to Lt when ambulating past open doors with people.  Educated family on proper lying technique in bed to increase positioning of LUE due to decreased sensation.  Also educated family on positioning to pt's Lt to promote scanning and attending to Lt during conversation and with mobility.  Pt ambulated outside on grass and up/down stairs with railing on Lt with supervision/cues on uneven terrain and cues for technique with stairs.  Pt returned to room and left semi-reclined in bed with family present.  Therapy Documentation Precautions:  Precautions Precautions: Fall  Precaution Comments: impulsive; L neglect Restrictions Weight Bearing Restrictions: No Pain: Pain Assessment Pain Assessment: No/denies pain Multiple Pain Sites: No  See FIM for current functional status  Therapy/Group: Individual Therapy  Rosalio Loud 08/08/2014, 12:33 PM

## 2014-08-08 NOTE — Progress Notes (Signed)
Speech Language Pathology Daily Session Note  Patient Details  Name: [name] MRN: [id number] Date of Birth: 10-Jan-1956  Today's Date: 08/08/2014 SLP Individual Time: 0930-1000 SLP Individual Time Calculation (min): 30 min  Short Term Goals: Week 1: SLP Short Term Goal 1 (Week 1): Pt will attend to the left of his body and/or the environment during functional tasks with min-mod assist verbal and visual cues.  SLP Short Term Goal 2 (Week 1): Pt will complete self care and/or home management tasks with min-mod assist verbal, visual, and tactile cues for functional problem solving  SLP Short Term Goal 3 (Week 1): Pt will consume his currently prescribed diet with supervision cues for use of swallowing precautions with minimal overt s/s of aspiration SLP Short Term Goal 4 (Week 1): Pt will consume trials of regular textures with supervision cues to monitor and correct left sided buccal residue over 3 targeted sessions prior to diet advancement.  SLP Short Term Goal 5 (Week 1): Pt will sustain attention to a basic, familiar task for 3-5 minute intervals with min-mod verbal, visual and tactile cues for redirection.  SLP Short Term Goal 6 (Week 1): Pt will initiate a functional task in a timely manner over 50% of opportunities wtih mod assist verbal cues.    Skilled Therapeutic Interventions:  Pt was seen for skilled ST targeting family education.  Upon arrival, pt was reclined in bed, asleep but easily awakened to voice and light touch.  Pt was encouraged to sit up at edge of bed to participate in family education.  [name] and mother were present for the duration of today's therapy session and were updated regarding pt's current goals and progress in therapy.  SLP reviewed and reinforced recommendation that pt have assistance for medication and financial management at discharge due to cognitive deficits.  SLP also provided pt's family with skilled education regarding cuing techniques to facilitate  pt's visual scanning to the left of midline and awareness of obstacles on the left in the environment.  SLP emphasized that pt has poor awareness of his deficits and how they will impact his ability to complete familiar tasks safely.   Pt's family is aware that pt will need close 24/7 supervision due to impulsivity, poor initiation, decreased functional problem solving, and distractibility.  Pt's family verbalized understanding of and were in agreement with recommendations.  SLP will continue to provide education as family is available prior to discharge.  Continue per current plan of care.    FIM:  Comprehension Comprehension Mode: Auditory Comprehension: 5-Understands basic 90% of the time/requires cueing < 10% of the time Expression Expression Mode: Verbal Expression: 5-Expresses basic 90% of the time/requires cueing < 10% of the time. Social Interaction Social Interaction: 3-Interacts appropriately 50 - 74% of the time - May be physically or verbally inappropriate. Problem Solving Problem Solving: 3-Solves basic 50 - 74% of the time/requires cueing 25 - 49% of the time Memory Memory: 3-Recognizes or recalls 50 - 74% of the time/requires cueing 25 - 49% of the time  Pain Pain Assessment Pain Assessment: No/denies pain  Therapy/Group: Individual Therapy  Bently Wyss, Melanee Spry 08/08/2014, 3:51 PM

## 2014-08-08 NOTE — Progress Notes (Signed)
Subjective/Complaints: Seen in gym, amb with PT assist,m no pain c/os ROS denies any chest pains or shortness of breath no coughing with meals, no pain complaints in the joints, per RN continent of bowel and bladder.    Objective: Vital Signs: Blood pressure 114/68, pulse 68, temperature 98.1 F (36.7 C), temperature source Oral, resp. rate 18, SpO2 100 %. No results found. No results found for this or any previous visit (from the past 72 hour(s)).   HEENT: normal Cardio: RRR Resp: CTA B/L and unlabored GI: BS positive Extremity:  Pulses positive and No Edema Skin:   Intact and Other IV site ok Neuro: Lethargic, Abnormal Sensory reduced sensation to pinch in LUE, Abnormal Motor 3- Left Bi, tri, 2-/5 in fingers and wrist, 3- in LLE and Dysarthric Musc/Skel:  Other no pain with UE or LE ROM Gen NAD   Assessment/Plan: 1. Functional deficits secondary to RIght MCA infarct with left hemiparesis which require 3+ hours per day of interdisciplinary therapy in a comprehensive inpatient rehab setting. Physiatrist is providing close team supervision and 24 hour management of active medical problems listed below. Physiatrist and rehab team continue to assess barriers to discharge/monitor patient progress toward functional and medical goals. Team conference today please see physician documentation under team conference tab, met with team face-to-face to discuss problems,progress, and goals. Formulized individual treatment plan based on medical history, underlying problem and comorbidities. FIM: FIM - Bathing Bathing Steps Patient Completed: Chest, Left Arm, Abdomen, Right Arm Bathing: 5: Supervision: Safety issues/verbal cues  FIM - Upper Body Dressing/Undressing Upper body dressing/undressing steps patient completed: Put head through opening of pull over shirt/dress, Thread/unthread right sleeve of pullover shirt/dresss, Thread/unthread left sleeve of pullover shirt/dress, Pull shirt over  trunk Upper body dressing/undressing: 5: Supervision: Safety issues/verbal cues FIM - Lower Body Dressing/Undressing Lower body dressing/undressing steps patient completed: Don/Doff right shoe, Don/Doff left shoe Lower body dressing/undressing: 4: Min-Patient completed 75 plus % of tasks  FIM - Toileting Toileting steps completed by patient: Adjust clothing prior to toileting, Performs perineal hygiene, Adjust clothing after toileting Toileting: 4: Steadying assist  FIM - Radio producer Devices: Grab bars Toilet Transfers: 4-From toilet/BSC: Min A (steadying Pt. > 75%), 4-To toilet/BSC: Min A (steadying Pt. > 75%)  FIM - Bed/Chair Transfer Bed/Chair Transfer Assistive Devices: Arm rests Bed/Chair Transfer: 5: Supine > Sit: Supervision (verbal cues/safety issues), 4: Sit > Supine: Min A (steadying pt. > 75%/lift 1 leg), 4: Bed > Chair or W/C: Min A (steadying Pt. > 75%), 4: Chair or W/C > Bed: Min A (steadying Pt. > 75%)  FIM - Locomotion: Wheelchair Distance: 150 Locomotion: Wheelchair: 1: Total Assistance/staff pushes wheelchair (Pt<25%) FIM - Locomotion: Ambulation Locomotion: Ambulation Assistive Devices: Other (comment) (no AD) Ambulation/Gait Assistance: 5: Supervision Locomotion: Ambulation: 5: Travels 150 ft or more with supervision/safety issues  Comprehension Comprehension Mode: Auditory Comprehension: 5-Understands complex 90% of the time/Cues < 10% of the time  Expression Expression Mode: Verbal Expression: 5-Expresses basic 90% of the time/requires cueing < 10% of the time.  Social Interaction Social Interaction: 3-Interacts appropriately 50 - 74% of the time - May be physically or verbally inappropriate.  Problem Solving Problem Solving Mode: Asleep Problem Solving: 3-Solves basic 50 - 74% of the time/requires cueing 25 - 49% of the time  Memory Memory: 3-Recognizes or recalls 50 - 74% of the time/requires cueing 25 - 49% of the  time  Medical Problem List and Plan: 1. Functional deficits secondary to extension right  MCA infarct embolic from right ICA occlusion 2.  DVT Prophylaxis/Anticoagulation: Subcutaneous heparin. Monitor platelet counts of any signs of bleeding 3. Pain Management: Tylenol as needed 4. Tobacco abuse. Continue Nicoderm patch. Provide counseling 5. Neuropsych: This patient is capable of making decisions on his own behalf. 6. Skin/Wound Care: Routine skin checks 7. Fluids/Electrolytes/Nutrition: Routine I&O is with follow-up chemistries 8. Hyperlipidemia. Lipitor 9. Hypertension. No present antihypertensives medication. Patient on Prinzide/Zestoretic 20-12.5 mg daily prior to admission. Resume as needed, has diastolic elevation will resume Prinivil, may need to add diuretic in future   LOS (Days) 7 A FACE TO FACE EVALUATION WAS PERFORMED  Ileah Falkenstein E 08/08/2014, 9:22 AM

## 2014-08-08 NOTE — Progress Notes (Signed)
Physical Therapy Session Note  Patient Details  Name: [name] MRN: [id number] Date of Birth: Jan 24, 1956  Today's Date: 08/08/2014 PT Individual Time: 0800-0900 PT Individual Time Calculation (min): 60 min   Short Term Goals: Week 1:  PT Short Term Goal 1 (Week 1): pt will perform bed mobility with 0-1 cue for L attention PT Short Term Goal 2 (Week 1): pt will transfer bed>< w/c to L with supervision with 2 or less cues for L attention PT Short Term Goal 3 (Week 1): pt will perform gait x 150' controlled env with close supervision, with 5 or less cues for attention PT Short Term Goal 4 (Week 1): pt will ascend/descend ramp with supervision PT Short Term Goal 5 (Week 1): pt will ascend/descend 12 steps 1 rail with min assist  Skilled Therapeutic Interventions/Progress Updates:    Pt received walking in room with wife present and no shoes or grip socks; no pain reported and agreeable to treatment. Educated pt and family (wife and mother) regarding safety while ambulating in room, use of shoes or grip socks for fall prevention. Pt instructed in gait training indoors/outdoors in level and unlevel surfaces including grass, mulch, pavement, curbs. During ambulation pt also performs stair ascent.descent of 12-14 stairs with 1 hand rail and supervision x3 trials. Pt requires frequent rest breaks due to fatigue. During rest breaks pt engaged in conversation to challenge cognitive skills, assess memory, and facilitate pt initiation of conversation to improve social skills. Overall purpose of activity to improve aerobic endurance, improve balance in variable environments and surfaces, improve L attention in functional settings. Pt engaged in standing balance activity while playing basketball, requiring use of BUEs for bimanual tasks and facilitation of LUE functional movement, dynamic standing balance with reaching outside BOS, stepping strategies for balance, as well as sustained attention and L attention.  Pt returned to room and seated in recliner with QR belt with all needs within reach and family present. Family asked about d/c plan and dietary concerns; RN notified regarding dietary concerns and informed family they would speak with social worker after conference meeting today.   Therapy Documentation Precautions:  Precautions Precautions: Fall Precaution Comments: impulsive; L neglect Restrictions Weight Bearing Restrictions: No Pain: Pain Assessment Pain Assessment: No/denies pain Multiple Pain Sites: No Locomotion : Ambulation Ambulation/Gait Assistance: 5: Supervision   See FIM for current functional status  Therapy/Group: Individual Therapy  Vista Lawman 08/08/2014, 9:57 AM

## 2014-08-09 ENCOUNTER — Inpatient Hospital Stay (HOSPITAL_COMMUNITY): Payer: BLUE CROSS/BLUE SHIELD | Admitting: Occupational Therapy

## 2014-08-09 ENCOUNTER — Inpatient Hospital Stay (HOSPITAL_COMMUNITY): Payer: BLUE CROSS/BLUE SHIELD | Admitting: Physical Therapy

## 2014-08-09 ENCOUNTER — Inpatient Hospital Stay (HOSPITAL_COMMUNITY): Payer: BLUE CROSS/BLUE SHIELD | Admitting: Speech Pathology

## 2014-08-09 DIAGNOSIS — R414 Neurologic neglect syndrome: Secondary | ICD-10-CM

## 2014-08-09 DIAGNOSIS — G8110 Spastic hemiplegia affecting unspecified side: Secondary | ICD-10-CM

## 2014-08-09 NOTE — Discharge Summary (Signed)
Discharge summary job # [id number]

## 2014-08-09 NOTE — Progress Notes (Signed)
Occupational Therapy Session Note  Patient Details  Name: [name] MRN: [id number] Date of Birth: 04/17/1955  Today's Date: 08/09/2014 OT Individual Time: 1100-1200 and 1300-1330 OT Individual Time Calculation (min): 60 min and 30 min   Short Term Goals: Week 1:  OT Short Term Goal 1 (Week 1): Pt will perform shower transfer with mod A into and out of shower in order to  decrease level of assist for functional transfers. OT Short Term Goal 2 (Week 1): Pt will perform toilet transfer with min A in order to increase I with functional transfers.  OT Short Term Goal 3 (Week 1): Pt will require no more than steady assist for balance during toileting. OT Short Term Goal 4 (Week 1): Pt will utilize strategies as needed with mod verbal cues or less to attend to L environment during therapy sessions. OT Short Term Goal 5 (Week 1): Pt will perform UB dressing with min A in order to increase I in self care.  Skilled Therapeutic Interventions/Progress Updates:    1) Engaged in therapeutic activity with focus on Lt attention during functional mobility and LUE NMR.  Pt reports already bathing and dressing with pt's mother reporting FIM levels upon questioning.  Pt will require tub transfer bench and BSC.  Ambulated to therapy gym with supervision/cues for attention to Lt environment.  Attempted to complete 9 hole peg test with pt unable to complete with LUE, attempted larger pegs with pt unable to grasp pegs even from upright with therapist steadying pegs.  Pt able to remove pegs from holes and place in container on Lt with cues to attend to Lt to ensure not knocking over container due to decreased sensation and attention.  Ambulated throughout therapy gym with supervision to locate 8 horseshoes with pt requiring multiple trips up and down gym and cues to scan to Lt.  Engaged in LUE activity with focus on grasp/release, finger adduction/abduction, and opposition.  Provided pt and pt's wife with handout of FMC  exercises to complete with focus on above functions primarily.  2) Engaged in family education with pt and pt's wife with discussion regarding 24 hr supervision and bathroom transfers and DME.  Pt performed toilet and tub/shower transfers with min cues for technique.  [name] with multiple questions about community mobility and Lt attention, therapist answered questions to pt's wife's satisfaction.  Therapy Documentation Precautions:  Precautions Precautions: Fall Precaution Comments: impulsive; L neglect Restrictions Weight Bearing Restrictions: No Pain: Pain Assessment Pain Assessment: No/denies pain  See FIM for current functional status  Therapy/Group: Individual Therapy  Rosalio Loud 08/09/2014, 12:36 PM

## 2014-08-09 NOTE — Progress Notes (Signed)
Subjective/Complaints: Pt remains impulsive with feeding, poor awareness of deficits, food,spilling out of Left side of mouth ROS denies any chest pains or shortness of breath no coughing with meals, no pain complaints in the joints,     Objective: Vital Signs: Blood pressure 117/68, pulse 70, temperature 98.6 F (37 C), temperature source Oral, resp. rate 18, weight 78.926 kg (174 lb), SpO2 100 %. No results found. No results found for this or any previous visit (from the past 72 hour(s)).   HEENT: normal Cardio: RRR Resp: CTA B/L and unlabored GI: BS positive Extremity:  Pulses positive and No Edema Skin:   Intact and Other IV site ok Neuro: Lethargic, Abnormal Sensory reduced sensation to pinch in LUE, Abnormal Motor 3- Left Bi, tri, 2-/5 in fingers and wrist, 3- in LLE and Dysarthric Musc/Skel:  Other no pain with UE or LE ROM Gen NAD   Assessment/Plan: 1. Functional deficits secondary to RIght MCA infarct with left hemiparesis which require 3+ hours per day of interdisciplinary therapy in a comprehensive inpatient rehab setting. Physiatrist is providing close team supervision and 24 hour management of active medical problems listed below. Discussed CT angiogram results with pt , wife , mom. Discussed signs and symptoms of stroke FIM: FIM - Bathing Bathing Steps Patient Completed: Chest, Left Arm, Abdomen, Right Arm, Front perineal area, Buttocks, Right upper leg, Left upper leg, Right lower leg (including foot), Left lower leg (including foot) Bathing: 5: Supervision: Safety issues/verbal cues  FIM - Upper Body Dressing/Undressing Upper body dressing/undressing steps patient completed: Put head through opening of pull over shirt/dress, Thread/unthread right sleeve of pullover shirt/dresss, Thread/unthread left sleeve of pullover shirt/dress, Pull shirt over trunk Upper body dressing/undressing: 5: Supervision: Safety issues/verbal cues FIM - Lower Body  Dressing/Undressing Lower body dressing/undressing steps patient completed: Thread/unthread right pants leg, Thread/unthread left pants leg, Pull pants up/down, Don/Doff right shoe, Don/Doff left shoe Lower body dressing/undressing: 5: Supervision: Safety issues/verbal cues  FIM - Toileting Toileting steps completed by patient: Adjust clothing prior to toileting, Performs perineal hygiene, Adjust clothing after toileting Toileting: 4: Steadying assist  FIM - Diplomatic Services operational officer Devices: Grab bars Toilet Transfers: 5-To toilet/BSC: Supervision (verbal cues/safety issues), 5-From toilet/BSC: Supervision (verbal cues/safety issues)  FIM - Banker Devices: Arm rests Bed/Chair Transfer: 5: Supine > Sit: Supervision (verbal cues/safety issues), 4: Sit > Supine: Min A (steadying pt. > 75%/lift 1 leg), 4: Bed > Chair or W/C: Min A (steadying Pt. > 75%), 4: Chair or W/C > Bed: Min A (steadying Pt. > 75%)  FIM - Locomotion: Wheelchair Distance: 150 Locomotion: Wheelchair: 1: Total Assistance/staff pushes wheelchair (Pt<25%) FIM - Locomotion: Ambulation Locomotion: Ambulation Assistive Devices: Other (comment) (no AD) Ambulation/Gait Assistance: 5: Supervision Locomotion: Ambulation: 5: Travels 150 ft or more with supervision/safety issues  Comprehension Comprehension Mode: Auditory Comprehension: 5-Understands basic 90% of the time/requires cueing < 10% of the time  Expression Expression Mode: Verbal Expression: 5-Expresses basic 90% of the time/requires cueing < 10% of the time.  Social Interaction Social Interaction: 3-Interacts appropriately 50 - 74% of the time - May be physically or verbally inappropriate.  Problem Solving Problem Solving Mode: Asleep Problem Solving: 3-Solves basic 50 - 74% of the time/requires cueing 25 - 49% of the time  Memory Memory: 3-Recognizes or recalls 50 - 74% of the time/requires cueing 25  - 49% of the time  Medical Problem List and Plan: 1. Functional deficits secondary to extension right MCA infarct embolic  from right ICA occlusion 2.  DVT Prophylaxis/Anticoagulation: Subcutaneous heparin. Monitor platelet counts of any signs of bleeding 3. Pain Management: Tylenol as needed 4. Tobacco abuse. Continue Nicoderm patch. Provide counseling 5. Neuropsych: This patient is capable of making decisions on his own behalf. 6. Skin/Wound Care: Routine skin checks 7. Fluids/Electrolytes/Nutrition: Routine I&O is with follow-up chemistries 8. Hyperlipidemia. Lipitor 9. Hypertension. No present antihypertensives medication. Patient on Prinzide/Zestoretic 20-12.5 mg daily prior to admission. Resume as needed, has diastolic elevation will resume Prinivil, may need to add diuretic in future   LOS (Days) 8 A FACE TO FACE EVALUATION WAS PERFORMED  Kayline Sheer E 08/09/2014, 7:53 AM

## 2014-08-09 NOTE — Progress Notes (Signed)
Physical Therapy Discharge Summary  Patient Details  Name: [name] MRN: [id number] Date of Birth: 1955/07/16  Today's Date: 08/09/2014 PT Individual Time: 1415-1445 and 1030-1100 PT Individual Time Calculation (min): 30 min and 30 min   Patient has met 10 of 10 long term goals due to improved activity tolerance, improved balance, improved postural control, increased strength, increased range of motion, decreased pain, ability to compensate for deficits, functional use of  left upper extremity and left lower extremity, improved attention, improved awareness and improved coordination.  Patient to discharge at an ambulatory level Supervision.   Patient's care partner is independent to provide the necessary physical and cognitive assistance at discharge.  Reasons goals not met: All goals met  Recommendation:  Patient will benefit from ongoing skilled PT services in outpatient setting to continue to advance safe functional mobility, address ongoing impairments in balance, LE strength, coordination, L sided attention, safety awareness, and minimize fall risk.  Equipment: No equipment provided  Reasons for discharge: treatment goals met and discharge from hospital  Patient/family agrees with progress made and goals achieved: Yes  PT Discharge Precautions/Restrictions Precautions Precautions: Fall Precaution Comments: impulsive, L neglect Restrictions Weight Bearing Restrictions: No Vital Signs Therapy Vitals Temp: 97.5 F (36.4 C) Temp Source: Oral Pulse Rate: 74 Resp: 17 BP: 107/61 mmHg Patient Position (if appropriate): Sitting Oxygen Therapy SpO2: 100 % O2 Device: Not Delivered Pain Pain Assessment Pain Assessment: No/denies pain Pain Score: 0-No pain Vision/Perception  Vision - Assessment Eye Alignment: Within Functional Limits Ocular Range of Motion: Within Functional Limits Alignment/Gaze Preference: Gaze right Tracking/Visual Pursuits: Unable to hold eye  position out of midline;Requires cues, head turns, or add eye shifts to track Saccades: Within functional limits Convergence: Within functional limits Additional Comments: L neglect  Cognition Overall Cognitive Status: Impaired/Different from baseline Arousal/Alertness: Awake/alert Orientation Level: Oriented X4 Attention: Sustained Sustained Attention: Impaired Sustained Attention Impairment: Functional basic;Verbal basic Selective Attention: Impaired Selective Attention Impairment: Verbal basic;Functional basic Memory: Impaired Memory Impairment: Decreased recall of new information Awareness: Impaired Awareness Impairment: Intellectual impairment Problem Solving: Impaired Problem Solving Impairment: Functional basic;Verbal basic Executive Function: Initiating;Self Monitoring;Self Correcting Initiating: Impaired Initiating Impairment: Functional basic Self Monitoring: Impaired Self Monitoring Impairment: Functional basic Self Correcting: Impaired Self Correcting Impairment: Functional basic Behaviors: Impulsive Safety/Judgment: Impaired Comments: left inattention  Sensation Sensation Light Touch: Impaired Detail Light Touch Impaired Details: Impaired LLE Stereognosis: Not tested Hot/Cold: Not tested Hot/Cold Impaired Details: Impaired LLE;Impaired LUE Proprioception: Impaired Detail Proprioception Impaired Details: Impaired LLE Additional Comments: impaired proprioception L ankle, hallux Coordination Gross Motor Movements are Fluid and Coordinated: No Fine Motor Movements are Fluid and Coordinated: No Heel Shin Test: [facility] 9 Hole Peg Test: uanble to complete on Lt due to decreased attention to task, impaired initiation, and LUE weakness Motor  Motor Motor: Hemiplegia Motor - Discharge Observations: LLE mild strength deficits compared to RLE  Mobility Bed Mobility Bed Mobility: Supine to Sit;Sit to Supine Supine to Sit: 5: Supervision Supine to Sit Details: Verbal  cues for precautions/safety Sit to Supine: 5: Supervision Sit to Supine - Details: Verbal cues for precautions/safety Transfers Transfers: Yes Sit to Stand: 5: Supervision Sit to Stand Details: Verbal cues for precautions/safety Stand Pivot Transfers: 5: Supervision Stand Pivot Transfer Details: Verbal cues for precautions/safety Stand Pivot Transfer Details (indicate cue type and reason): min cues for L attention Locomotion  Ambulation Ambulation: Yes Ambulation/Gait Assistance: 5: Supervision Ambulation Distance (Feet): 200 Feet Assistive device: None Ambulation/Gait Assistance Details: Verbal cues for precautions/safety Ambulation/Gait Assistance Details:  cues for L attention in all environments Gait Gait: Yes Gait Pattern: Impaired Gait Pattern: Decreased weight shift to left;Poor foot clearance - left;Poor foot clearance - right;Decreased stride length Gait velocity: decreased for age 1 / Additional Locomotion Stairs: Yes Stairs Assistance: 5: Supervision Stairs Assistance Details: Verbal cues for precautions/safety;Verbal cues for technique Stairs Assistance Details (indicate cue type and reason): cues for correct sequencing, safety, L attention, foot placement on stairs Stair Management Technique: One rail Right;Alternating pattern Number of Stairs: 12 Height of Stairs: 6 Ramp: 5: Supervision Curb: 5: Psychiatric nurse: Yes Wheelchair Assistance: 4: Energy manager: Right upper extremity;Right lower extremity Wheelchair Parts Management: Needs assistance Distance: 150  Trunk/Postural Assessment  Cervical Assessment Cervical Assessment: Within Functional Limits Thoracic Assessment Thoracic Assessment: Within Functional Limits Lumbar Assessment Lumbar Assessment: Within Functional Limits Postural Control Postural Control: Deficits on evaluation Protective Responses: delayed and inadequate   Balance Balance Balance Assessed: Yes Standardized Balance Assessment Standardized Balance Assessment: Berg Balance Test Berg Balance Test Sit to Stand: Able to stand  independently using hands Standing Unsupported: Able to stand 2 minutes with supervision Sitting with Back Unsupported but Feet Supported on Floor or Stool: Able to sit safely and securely 2 minutes Stand to Sit: Sits safely with minimal use of hands Transfers: Able to transfer safely, minor use of hands Standing Unsupported with Eyes Closed: Able to stand 10 seconds with supervision Standing Ubsupported with Feet Together: Able to place feet together independently and stand for 1 minute with supervision From Standing, Reach Forward with Outstretched Arm: Can reach confidently >25 cm (10") From Standing Position, Pick up Object from Floor: Able to pick up shoe, needs supervision From Standing Position, Turn to Look Behind Over each Shoulder: Looks behind one side only/other side shows less weight shift Turn 360 Degrees: Able to turn 360 degrees safely but slowly Standing Unsupported, Alternately Place Feet on Step/Stool: Able to stand independently and safely and complete 8 steps in 20 seconds Standing Unsupported, One Foot in Front: Able to plae foot ahead of the other independently and hold 30 seconds Standing on One Leg: Tries to lift leg/unable to hold 3 seconds but remains standing independently Total Score: 44 Static Sitting Balance Static Sitting - Level of Assistance: 5: Stand by assistance Dynamic Sitting Balance Dynamic Sitting - Level of Assistance: 5: Stand by assistance Static Standing Balance Static Standing - Level of Assistance: 5: Stand by assistance Dynamic Standing Balance Dynamic Standing - Level of Assistance: 5: Stand by assistance Extremity Assessment  RUE Assessment RUE Assessment: Within Functional Limits LUE Assessment LUE Assessment: Exceptions to WFL (ROM WFL, marginally limited in full  shoulder flexion.  Strength grossly 3+/5) RLE Assessment RLE Assessment: Within Functional Limits LLE Assessment LLE Assessment: Exceptions to WFL LLE AROM (degrees) Overall AROM Left Lower Extremity: Within functional limits for tasks assessed LLE Strength LLE Overall Strength Comments: Hip flexion 4/5, knee ext 4/5, knee flex 4/5, DF 4+/5, PF 5/5  See FIM for current functional status  Skilled therapeutic intervention: AM session: Pt received seated in recliner with family present; no c/o pain and agreeable to treatment. All strength, sensation, coordination and mobility assessed in preparation for d/c with assist as described above. Pt instructed in gait for community distances with no LOB and L inattention causing pt to occasionally walk into doors/objects. Pt returned to room and seated in recliner with all needs within reach.   PM session: Pt received in recliner with family present; no c/o pain and  agreeable to treatment. [name] and mother educated on safety upon d/c, encouraged to keep pt active to improve mobility, balance, and safety. Wife expressed concerns regarding needing a w/c to bring pt into doctors appointments; assured wife that pt was able to safely ambulate community distances with supervision on guarding on L side due to inattention. Pt demonstrated to wife ascent/descent of 12 stairs as well as car transfer in an effort to ease wife's concerns. Pt returned to room and transferred into bed with supervision; remained in bed at completion of session with family present and all needs within reach.   [name] 08/09/2014, 3:44 PM

## 2014-08-09 NOTE — Progress Notes (Signed)
Occupational Therapy Discharge Summary  Patient Details  Name: [name] MRN: [id number] Date of Birth: 1955-11-21  Patient has met 16 of 70 long term goals due to improved activity tolerance, improved balance, ability to compensate for deficits, functional use of  LEFT upper extremity and improved awareness.  Patient to discharge at overall Supervision level.  Patient's care partner is independent to provide the necessary cognitive assistance at discharge.  Hands on education has been completed with pt's wife and mother, also have provided handouts to promote carryover of education to serve as reminder to pt and wife of current recommendations.  Reasons goals not met: N/A  Recommendation:  Patient will benefit from ongoing skilled OT services in outpatient setting to continue to advance functional skills in the area of BADL, Reduce care partner burden and LUE NMR.  Equipment: tub transfer bench and BSC  Reasons for discharge: treatment goals met and discharge from hospital  Patient/family agrees with progress made and goals achieved: Yes  OT Discharge Precautions/Restrictions  Precautions Precautions: Fall Precaution Comments: impulsive; Lt neglect Pain Pain Assessment Pain Assessment: No/denies pain ADL  See FIM Vision/Perception  Vision- History Baseline Vision/History: Wears glasses Wears Glasses: Reading only Patient Visual Report: Other (comment) (decreased attention to Lt environment) Vision- Assessment Vision Assessment?: Yes;Vision impaired- to be further tested in functional context Eye Alignment: Within Functional Limits Ocular Range of Motion: Within Functional Limits Alignment/Gaze Preference: Gaze right Tracking/Visual Pursuits: Unable to hold eye position out of midline;Requires cues, head turns, or add eye shifts to track Saccades: Within functional limits Convergence: Within functional limits Additional Comments: Lt neglect  Cognition Overall Cognitive  Status: Impaired/Different from baseline Arousal/Alertness: Awake/alert Orientation Level: Oriented X4 Attention: Sustained Sustained Attention: Impaired Sustained Attention Impairment: Functional basic;Verbal basic Memory: Impaired Memory Impairment: Decreased recall of new information Awareness: Impaired Awareness Impairment: Intellectual impairment Problem Solving: Impaired Problem Solving Impairment: Functional basic;Verbal basic Executive Function: Initiating;Self Monitoring;Self Correcting Initiating: Impaired Initiating Impairment: Functional basic Self Monitoring: Impaired Self Monitoring Impairment: Functional basic Self Correcting: Impaired Self Correcting Impairment: Functional basic Behaviors: Impulsive Safety/Judgment: Impaired Comments: left inattention  Sensation Sensation Light Touch: Impaired Detail Light Touch Impaired Details: Impaired LLE;Absent LUE Stereognosis: Not tested Hot/Cold: Impaired Detail Hot/Cold Impaired Details: Impaired LLE;Impaired LUE Proprioception: Impaired Detail Proprioception Impaired Details: Impaired LLE;Impaired LUE Coordination Gross Motor Movements are Fluid and Coordinated: No Fine Motor Movements are Fluid and Coordinated: No 9 Hole Peg Test: uanble to complete on Lt due to decreased attention to task, impaired initiation, and LUE weakness Extremity/Trunk Assessment RUE Assessment RUE Assessment: Within Functional Limits LUE Assessment LUE Assessment: Exceptions to WFL (ROM WFL, marginally limited in full shoulder flexion.  Strength grossly 3+/5)  See FIM for current functional status  Sirr Kabel, [facility] 08/09/2014, 12:48 PM

## 2014-08-09 NOTE — Progress Notes (Signed)
Speech Language Pathology Discharge Summary  Patient Details  Name: [name] MRN: [id number] Date of Birth: [birth year]  Today's Date: 08/09/2014 SLP Individual Time: 0830-0930 SLP Individual Time Calculation (min): 60 min   Skilled Therapeutic Interventions:   Pt was seen for skilled ST targeting cognitive goals.  Upon arrival, pt was laying in bed, asleep, but easily awakened to voice and light touch.  [name] and wife were present at the beginning of today's therapy session and were provided with handouts of distraction management strategies, memory compensatory strategies, and activities for cognitive remediation/compensation to facilitate carryover of education targeted throughout pt's inpatient rehab admission in the home environment.  All questions were answered to their satisfaction at this time.  Pt ambulated to the ST treatment room with supervision cues for awareness of obstacles in the left environment.  SLP administered portions of the ALFA standardized cognitive assessment to measure progress from initial admission.  Pt's results were as follows: Pt required overall mod assist verbal and visual cues for organization, working memory, and awareness of deficits when counting money, making change, and generating combinations of coins for a named value on the money management subtest.  Pt also benefited from mod faded to min assist verbal cues for functional problem solving during the medication management subtest.  Pt answered questions related to a monthly calendar with min assist-supervision cues for visual scanning to the left of midline. And he addressed an envelope with mod-max assist verbal and visual cues for organization of information and awareness of errors. Pt sustained his attention to the abovementioned tasks for ~3-5 minute intervals with min verbal cues for redirection.  Pt ambulated back to room with supervision cues for route recall.  Pt was left in recliner with wife  present and call bell left within reach.  Pt was encouraged to sit up and not go back to bed.     Patient has met 6 of 9 long term goals.  Patient to discharge at [facility] level.  Reasons goals not met: pt continues to present with significant deficits for sustained attention and intellectual awareness of deficits    Clinical Impression/Discharge Summary:  Pt made small functional gains while inpatient and has met 6 out of 9 long term goals.  He currently requires overall min assist during basic, familiar cognitive tasks due to decreased sustained attention, poor initiation, decreased intellectual awareness of deficits, decreased functional problem solving, left inattention, impulsivity, and decreased recall of new information.  Pt's diet has been upgraded to regular textures with thin liquids and pt is utilizing recommended swallowing precautions with supervision cues to monitor and correct left sided buccal residue and for rate/portion control.  Pt is discharging home with 24/7 supervision from family.  Recommend that pt also have assistance for medication and financial management and ST follow up at next level of care.  Pt and family education is complete at this time.     Care Partner:  Caregiver Able to Provide Assistance: Yes  Type of Caregiver Assistance: Physical;Cognitive  Recommendation:  Home Health SLP;24 hour supervision/assistance  Rationale for SLP Follow Up: Maximize cognitive function and independence;Reduce caregiver burden   Equipment: none recommended by SLP    Reasons for discharge: Discharged from hospital   Patient/Family Agrees with Progress Made and Goals Achieved: Yes   See FIM for current functional status  PageSelinda Orion 08/09/2014, 7:51 AM

## 2014-08-09 NOTE — Discharge Instructions (Signed)
Inpatient Rehab Discharge Instructions  [name] Discharge date and time: No discharge date for patient encounter.   Activities/Precautions/ Functional Status: Activity: activity as tolerated Diet: regular diet Wound Care: none needed Functional status:  ___ No restrictions     ___ Walk up steps independently ___ 24/7 supervision/assistance   ___ Walk up steps with assistance ___ Intermittent supervision/assistance  ___ Bathe/dress independently ___ Walk with walker     ___ Bathe/dress with assistance ___ Walk Independently    ___ Shower independently __x_ Walk with assistance    ___ Shower with assistance ___ No alcohol     ___ Return to work/school ________  Special Instructions: No smoking   COMMUNITY REFERRALS UPON DISCHARGE:    Outpatient: PT, OT, SP  Agency:[facility] Phone:[phone]   Date of Last Service:08/10/2014  Appointment Date/Time:JUNE 27-Monday 1;45-3;30 PM-PT & OT   6/29 @ 2;00-2;45 PM-SP  Medical Equipment/Items Ordered:BEDSIDE COMMODE & TUB BENCH  Agency/Supplier:[facility]   [phone]   GENERAL COMMUNITY RESOURCES FOR PATIENT/FAMILY: Support Groups:[facility] SUPPORT GROUP  STROKE/TIA DISCHARGE INSTRUCTIONS SMOKING Cigarette smoking nearly doubles your risk of having a stroke & is the single most alterable risk factor  If you smoke or have smoked in the last 12 months, you are advised to quit smoking for your health.  Most of the excess cardiovascular risk related to smoking disappears within a year of stopping.  Ask you doctor about anti-smoking medications  [place] Quit Line: 1-800-QUIT NOW  Free Smoking Cessation Classes [phone]  CHOLESTEROL Know your levels; limit fat & cholesterol in your diet  Lipid Panel     Component Value Date/Time   CHOL 192 07/26/2014 0437   TRIG 305* 07/26/2014 0437   HDL 34* 07/26/2014 0437   CHOLHDL 5.6 07/26/2014 0437   VLDL 61* 07/26/2014 0437   LDLCALC 97 07/26/2014 0437       Many patients benefit from treatment even if their cholesterol is at goal.  Goal: Total Cholesterol (CHOL) less than 160  Goal:  Triglycerides (TRIG) less than 150  Goal:  HDL greater than 40  Goal:  LDL (LDLCALC) less than 100   BLOOD PRESSURE American Stroke Association blood pressure target is less that 120/80 mm/Hg  Your discharge blood pressure is:  BP: 114/68 mmHg  Monitor your blood pressure  Limit your salt and alcohol intake  Many individuals will require more than one medication for high blood pressure  DIABETES (A1c is a blood sugar average for last 3 months) Goal HGBA1c is under 7% (HBGA1c is blood sugar average for last 3 months)  Diabetes: No known diagnosis of diabetes    Lab Results  Component Value Date   HGBA1C 6.0* 07/26/2014     Your HGBA1c can be lowered with medications, healthy diet, and exercise.  Check your blood sugar as directed by your physician  Call your physician if you experience unexplained or low blood sugars.  PHYSICAL ACTIVITY/REHABILITATION Goal is 30 minutes at least 4 days per week  Activity: Increase activity slowly, Therapies: Physical Therapy: Home Health Return to work:   Activity decreases your risk of heart attack and stroke and makes your heart stronger.  It helps control your weight and blood pressure; helps you relax and can improve your mood.  Participate in a regular exercise program.  Talk with your doctor about the best form of exercise for you (dancing, walking, swimming, cycling).  DIET/WEIGHT Goal is to maintain a healthy weight  Your discharge diet is: Diet Heart  Room service appropriate?: Yes; Fluid consistency:: Thin  liquids Your height is:    Your current weight is:   Your Body Mass Index (BMI) is:     Following the type of diet specifically designed for you will help prevent another stroke.  Your goal weight range is:    Your goal Body Mass Index (BMI) is 19-24.  Healthy food habits can help reduce 3  risk factors for stroke:  High cholesterol, hypertension, and excess weight.  RESOURCES Stroke/Support Group:  Call [phone]   STROKE EDUCATION PROVIDED/REVIEWED AND GIVEN TO PATIENT Stroke warning signs and symptoms How to activate emergency medical system (call 911). Medications prescribed at discharge. Need for follow-up after discharge. Personal risk factors for stroke. Pneumonia vaccine given:  Flu vaccine given:  My questions have been answered, the writing is legible, and I understand these instructions.  I will adhere to these goals & educational materials that have been provided to me after my discharge from the hospital.       My questions have been answered and I understand these instructions. I will adhere to these goals and the provided educational materials after my discharge from the hospital.  Patient/Caregiver Signature _______________________________ Date __________  Clinician Signature _______________________________________ Date __________  Please bring this form and your medication list with you to all your follow-up doctor's appointments.

## 2014-08-09 NOTE — Plan of Care (Signed)
Problem: RH Memory Goal: LTG Patient will use memory compensatory aids to (SLP) LTG: Patient will use memory compensatory aids to recall biographical/new, daily complex information with cues (SLP)  Outcome: Not Met (add Reason) Pt continues to require mod assist for recall of daily events and information   Problem: RH Attention Goal: LTG Patient will demonstrate focused/sustained (SLP) LTG: Patient will demonstrate focused/sustained/selective/alternating/divided attention during cognitive/linguistic activities in specific environment with assist for # of minutes (SLP)  Outcome: Not Met (add Reason) Pt can sustain his attention to tasks for ~3-5 minutes before requiring cues for redirection   Problem: RH Awareness Goal: LTG: Patient will demonstrate intellectual/emergent (SLP) LTG: Patient will demonstrate intellectual/emergent/anticipatory awareness with assist during a cognitive/linguistic activity (SLP)  Outcome: Not Met (add Reason) Pt requires min-mod assist for intellectual awareness of deficits.

## 2014-08-09 NOTE — Progress Notes (Signed)
Social Work Patient ID: [name], male   DOB: 26-Apr-1955, 59 y.o.   MRN: [id number] Met with pt and wife who report he will need a tub bench and bedside commode for discharge. Finishing up family education and is ready to go home.

## 2014-08-09 NOTE — Discharge Summary (Signed)
NAMEMarland Kitchen  VANDEN, FAWAZ NO.:  [id number]  MEDICAL RECORD NO.:  [id number]  LOCATION:  4W20C                        FACILITY:  [facility]  PHYSICIAN:  Mariam Dollar, P.A.  DATE OF BIRTH:  1955/12/14  DATE OF ADMISSION:  08/01/2014 DATE OF DISCHARGE:  08/10/2014                              DISCHARGE SUMMARY   DISCHARGE DIAGNOSES: 1. Functional deficits secondary to extension right middle cerebral     artery (MCA) infarct, embolic from right internal carotid artery     (ICA) occlusion. 2. Subcutaneous heparin for deep venous thrombosis (DVT) prophylaxis. 3. Tobacco abuse. 4. Hypertension. 5. Hyperlipidemia.  HISTORY OF PRESENT ILLNESS:  This is a 59 year old right-handed male, with history of hypertension, right MCA territory infarct on July 25, 2014 with single right M2 branch occlusion identified just beyond the bifurcation and occluded right ICA origin.  Underwent loop recorder placement per Dr. Ladona Ridgel, discharged to home on aspirin as well as Plavix.  On July 27, 2014 was independent without assisted device.  The patient had been working full-time prior to initial admission. Presented on July 29, 2014, with increasing left-sided weakness and slurred speech.  An MRI of the brain showed acute moderate right middle cerebral artery territory infarct further involvement of the operculum, new involvement of the Insula and right basal ganglia.  Recent echocardiogram with ejection fraction of 70%, grade I diastolic dysfunction.  Recent venous Doppler studies negative.  The patient did not receive tPA.  Remained on aspirin and Plavix therapy.  Physical and occupational therapy ongoing.  The patient was admitted for a comprehensive rehab program.  PAST MEDICAL HISTORY:  See discharge diagnoses.  SOCIAL HISTORY:  Independent.  Prior to July 25, 2014, working full time. Functional status upon admission to rehab services was max assist ambulate 4 feet, one-person hand-held  assistance, max assist sit-to- stand, mod-max assist, activities of daily living.  PHYSICAL EXAMINATION:  VITAL SIGNS:  Blood pressure 127/74, pulse 59, temperature 98, and respirations 20. GENERAL:  This was a lethargic male.  He was arousable, significant right gaze preference.  Fair awareness of deficits. LUNGS:  Clear to auscultation. CARDIAC:  Regular rate and rhythm. ABDOMEN:  Soft, nontender.  Good bowel sounds.  REHABILITATION HOSPITAL COURSE:  The patient was admitted to inpatient rehab services with therapies initiated on a 3-hour daily basis consisting of physical therapy, occupational therapy, speech therapy, and rehabilitation nursing.  The following issues were addressed during the patient's rehabilitation stay.  Pertaining to Mr. Strader right MCA infarct, he remained on aspirin and Plavix therapy.  He would follow up with Neurology Services.  He had undergone implantation of loop recorder and would follow up Cardiology Services.  He did have a history of tobacco abuse, maintained on a NicoDerm patch, receiving full counseling.  Blood pressures controlled.  Low dose lisinopril was initiated and monitored.  He would follow up with his primary MD.  He continued on Lipitor for hyperlipidemia.  His diet was advanced to a regular consistency.  The patient received weekly collaborative interdisciplinary team conferences to discuss estimated length of stay, family teaching, any barriers to his discharge.  The patient instructed in gait training indoors and outdoors  on level and unlevel surfaces. Perform stair negotiation with supervision.  Requires occasional rest breaks.  Engaged in standing balance activities.  Demonstrates some mild loss of balance on performing cognitive dual tasks, during gait and stair climbing.  He is able to complete bathing and dressing with some increased time.  He would ambulate to the shower with supervision. Completed bathing sit-to-stand level  supervision.  Speech therapy work the patient on visual scanning to the left to increase awareness, ongoing initiation with family, the need for 24-hour supervision for his safety.  Full family teaching was completed.  Plan discharge to home.  DISCHARGE MEDICATIONS:  Included; 1. Aspirin 81 mg p.o. daily. 2. Plavix 75 mg p.o. daily. 3. Lisinopril 5 mg p.o. daily. 4. NicoDerm patch taper as directed.  DIET:  Regular.  FOLLOWUP:  He would follow up Dr. Claudette Laws at the outpatient rehab center on September 10, 2014; Dr. Pearlean Brownie, Neurology Services in one month, call for appointment; Dr. Gilman Schmidt in 2 weeks, call for appointment; Dr. Dow Adolph, medical management.     Mariam Dollar, P.A.     DA/MEDQ  D:  08/09/2014  T:  08/09/2014  Job:  [id number]  cc:   Erick Colace, M.D. Gilman Schmidt Maurice March, M.D. Pramod P. Pearlean Brownie, MD

## 2014-08-10 MED ORDER — ATORVASTATIN CALCIUM 80 MG PO TABS: 80.0000 mg | ORAL_TABLET | Freq: Every day | ORAL | Status: DC

## 2014-08-10 MED ORDER — LISINOPRIL 5 MG PO TABS: 5.0000 mg | ORAL_TABLET | Freq: Every day | ORAL | Status: DC

## 2014-08-10 MED ORDER — CLOPIDOGREL BISULFATE 75 MG PO TABS: 75.0000 mg | ORAL_TABLET | Freq: Every day | ORAL | Status: DC

## 2014-08-10 MED ORDER — NICOTINE 14 MG/24HR TD PT24: MEDICATED_PATCH | TRANSDERMAL | Status: DC

## 2014-08-10 NOTE — Progress Notes (Addendum)
Subjective/Complaints: Appropriate questions regarding "dos and don't"    Objective: Vital Signs: Blood pressure 131/77, pulse 64, temperature 97.5 F (36.4 C), temperature source Oral, resp. rate 18, weight 78.926 kg (174 lb), SpO2 98 %. No results found. No results found for this or any previous visit (from the past 72 hour(s)).   HEENT: normal Cardio: RRR Resp: CTA B/L and unlabored GI: BS positive Extremity:  Pulses positive and No Edema Skin:   Intact and Other IV site ok Neuro: Lethargic, Abnormal Sensory reduced sensation to pinch in LUE, Abnormal Motor 3- Left Bi, tri, 2-/5 in fingers and wrist, 3- in LLE and Dysarthric Musc/Skel:  Other no pain with UE or LE ROM Gen NAD   Assessment/Plan: 1. Functional deficits secondary to RIght MCA infarct with left hemiparesis  Stable for D/C today F/u PCP in 1-2 weeks F/u PM&R 4-6 weeks See D/C summary See D/C instructions FIM: FIM - Bathing Bathing Steps Patient Completed: Chest, Left Arm, Abdomen, Right Arm, Front perineal area, Buttocks, Right upper leg, Left upper leg, Right lower leg (including foot), Left lower leg (including foot) Bathing: 5: Supervision: Safety issues/verbal cues (per family report)  FIM - Upper Body Dressing/Undressing Upper body dressing/undressing steps patient completed: Put head through opening of pull over shirt/dress, Thread/unthread right sleeve of pullover shirt/dresss, Thread/unthread left sleeve of pullover shirt/dress, Pull shirt over trunk Upper body dressing/undressing: 5: Supervision: Safety issues/verbal cues (per family report) FIM - Lower Body Dressing/Undressing Lower body dressing/undressing steps patient completed: Thread/unthread right pants leg, Thread/unthread left pants leg, Pull pants up/down, Don/Doff right shoe, Don/Doff left shoe, Don/Doff right sock, Don/Doff left sock Lower body dressing/undressing: 5: Supervision: Safety issues/verbal cues (per family report)  FIM -  Toileting Toileting steps completed by patient: Adjust clothing prior to toileting, Performs perineal hygiene, Adjust clothing after toileting Toileting: 5: Supervision: Safety issues/verbal cues  FIM - Diplomatic Services operational officer Devices: Grab bars Toilet Transfers: 5-To toilet/BSC: Supervision (verbal cues/safety issues), 5-From toilet/BSC: Supervision (verbal cues/safety issues)  FIM - Banker Devices: Arm rests Bed/Chair Transfer: 5: Sit > Supine: Supervision (verbal cues/safety issues), 5: Supine > Sit: Supervision (verbal cues/safety issues), 5: Bed > Chair or W/C: Supervision (verbal cues/safety issues), 5: Chair or W/C > Bed: Supervision (verbal cues/safety issues)  FIM - Locomotion: Wheelchair Distance: 150 Locomotion: Wheelchair: 0: Activity did not occur (Pt is ambulatory) FIM - Locomotion: Ambulation Locomotion: Ambulation Assistive Devices: Other (comment) (no AD) Ambulation/Gait Assistance: 5: Supervision Locomotion: Ambulation: 5: Travels 150 ft or more with supervision/safety issues  Comprehension Comprehension Mode: Auditory Comprehension: 5-Understands basic 90% of the time/requires cueing < 10% of the time  Expression Expression Mode: Verbal Expression: 6-Expresses complex ideas: With extra time/assistive device  Social Interaction Social Interaction: 4-Interacts appropriately 75 - 89% of the time - Needs redirection for appropriate language or to initiate interaction.  Problem Solving Problem Solving Mode: Asleep Problem Solving: 4-Solves basic 75 - 89% of the time/requires cueing 10 - 24% of the time  Memory Memory: 4-Recognizes or recalls 75 - 89% of the time/requires cueing 10 - 24% of the time  Medical Problem List and Plan: 1. Functional deficits secondary to extension right MCA infarct embolic from right ICA occlusion 2.  DVT Prophylaxis/Anticoagulation: Subcutaneous heparin. Monitor platelet  counts of any signs of bleeding 3. Pain Management: Tylenol as needed 4. Tobacco abuse. Continue Nicoderm patch. Provide counseling 5. Neuropsych: This patient is capable of making decisions on his own behalf. 6. Skin/Wound Care:  Routine skin checks 7. Fluids/Electrolytes/Nutrition: Routine I&O is with follow-up chemistries 8. Hyperlipidemia. Lipitor 9. Hypertension. No present antihypertensives medication. Patient on Prinzide/Zestoretic 20-12.5 mg daily prior to admission. Resume as needed, has diastolic elevation will resume Prinivil, may need to add diuretic in future-f/u PCP   LOS (Days) 9 A FACE TO FACE EVALUATION WAS PERFORMED  KIRSTEINS,ANDREW E 08/10/2014, 8:14 AM

## 2014-08-10 NOTE — Progress Notes (Signed)
Social Work Discharge Note Discharge Note  The overall goal for the admission was met for:   Discharge location: Yes-HOME WITH WIFE AND MOM TO ASSIST-24 HR SUPERVISION  Length of Stay: Yes-9 DAYS  Discharge activity level: Yes-SUPERVISION WITH CUEING  Home/community participation: Yes  Services provided included: MD, RD, PT, OT, SLP, RN, CM, TR, Pharmacy and SW  Financial Services: Private Insurance: [employer]  Follow-up services arranged: Outpatient: [facility]-PT,OT,SP 6/27 1;45-3;30-PT & OT AND 6/29 2;00-;245-SP, DME: [facility] and Patient/Family has no preference for HH/DME agencies  Comments (or additional information):FAMILY EDUCATION COMPLETED WITH WIFE AND MOM, BOTH COMFORTABLE WITH PT'S CARE AND AWARE OF HIS NEED FOR 24 HR CLOSE SUPERVISION  Patient/Family verbalized understanding of follow-up arrangements: Yes  Individual responsible for coordination of the follow-up plan: SELF, JANET-WIFE & [place]-MOM  Confirmed correct DME delivered: Elease Hashimoto 08/10/2014    Elease Hashimoto

## 2014-08-13 ENCOUNTER — Encounter: Payer: Self-pay | Admitting: Occupational Therapy

## 2014-08-13 ENCOUNTER — Ambulatory Visit: Payer: BLUE CROSS/BLUE SHIELD | Attending: Physical Medicine & Rehabilitation | Admitting: Occupational Therapy

## 2014-08-13 ENCOUNTER — Ambulatory Visit: Payer: BLUE CROSS/BLUE SHIELD | Admitting: Physical Therapy

## 2014-08-13 DIAGNOSIS — R41840 Attention and concentration deficit: Secondary | ICD-10-CM | POA: Diagnosis not present

## 2014-08-13 DIAGNOSIS — R4189 Other symptoms and signs involving cognitive functions and awareness: Secondary | ICD-10-CM | POA: Diagnosis not present

## 2014-08-13 DIAGNOSIS — I69859 Hemiplegia and hemiparesis following other cerebrovascular disease affecting unspecified side: Secondary | ICD-10-CM | POA: Diagnosis not present

## 2014-08-13 DIAGNOSIS — R29898 Other symptoms and signs involving the musculoskeletal system: Secondary | ICD-10-CM

## 2014-08-13 DIAGNOSIS — H538 Other visual disturbances: Secondary | ICD-10-CM | POA: Insufficient documentation

## 2014-08-13 DIAGNOSIS — R208 Other disturbances of skin sensation: Secondary | ICD-10-CM | POA: Insufficient documentation

## 2014-08-13 DIAGNOSIS — I69998 Other sequelae following unspecified cerebrovascular disease: Secondary | ICD-10-CM

## 2014-08-13 DIAGNOSIS — R209 Unspecified disturbances of skin sensation: Secondary | ICD-10-CM

## 2014-08-13 DIAGNOSIS — I69359 Hemiplegia and hemiparesis following cerebral infarction affecting unspecified side: Secondary | ICD-10-CM

## 2014-08-13 DIAGNOSIS — H539 Unspecified visual disturbance: Secondary | ICD-10-CM

## 2014-08-13 DIAGNOSIS — I69898 Other sequelae of other cerebrovascular disease: Secondary | ICD-10-CM | POA: Diagnosis not present

## 2014-08-13 DIAGNOSIS — R269 Unspecified abnormalities of gait and mobility: Secondary | ICD-10-CM

## 2014-08-13 NOTE — Therapy (Signed)
[facility] [address] [place], [place], [address] Phone: [phone]   Fax:  [phone]  Occupational Therapy Evaluation  Patient Details  Name: [name] MRN: [id number] Date of Birth: 28-Jan-1956 Referring Provider:  Erick Colace, MD  Encounter Date: 08/13/2014      OT End of Session - 08/13/14 1809    Visit Number 1   Number of Visits 17   Date for OT Re-Evaluation 10/10/14   Authorization Type BCBS   Authorization - Number of Visits 30   OT Start Time 1445   OT Stop Time 1533   OT Time Calculation (min) 48 min   Activity Tolerance Patient tolerated treatment well   Behavior During Therapy [facility] for tasks assessed/performed      Past Medical History  Diagnosis Date  . Arthritis   . Hypertension   . Hyperlipidemia     Past Surgical History  Procedure Laterality Date  . Loop recorder implant  07/27/14    LOOP REVEAL LINQ XLK44 - [id number]  . Tee without cardioversion N/A 07/27/2014    Procedure: TRANSESOPHAGEAL ECHOCARDIOGRAM (TEE);  Surgeon: Chrystie Nose, MD;  Location: [facility] ENDOSCOPY;  Service: Cardiovascular;  Laterality: N/A;  . Ep implantable device N/A 07/27/2014    Procedure: Loop Recorder Insertion;  Surgeon: Marinus Maw, MD;  Location: [facility] INVASIVE CV LAB;  Service: Cardiovascular;  Laterality: N/A;    There were no vitals filed for this visit.  Visit Diagnosis:  Sensation alteration, late effect of cerebrovascular disease - Plan: OT PLAN OF CARE CERT/RE-CERT  Hemiparesis affecting nondominant side as late effect of cerebrovascular accident - Plan: OT PLAN OF CARE CERT/RE-CERT  Impaired cognition - Plan: OT PLAN OF CARE CERT/RE-CERT  Inattention - Plan: OT PLAN OF CARE CERT/RE-CERT  Visual disturbance - Plan: OT PLAN OF CARE CERT/RE-CERT      Subjective Assessment - 08/13/14 1500    Subjective  I want to mow my yard, tend to my garden, get back to work.   Pertinent History  see snapshot   Patient Stated Goals return to gardening, independence with self care.   Currently in Pain? No/denies   Pain Score 0-No pain           [facility] OT Assessment - 08/13/14 1516    Assessment   Diagnosis r MCA CVA   Onset Date 07/25/14  With evolution 6/12   Prior Therapy Recent discharge 6/24 from inpatient rehab   Precautions   Precautions Fall   Precaution Comments significant right gaze preference   Restrictions   Weight Bearing Restrictions No   Balance Screen   Has the patient fallen in the past 6 months No   Has the patient had a decrease in activity level because of a fear of falling?  No   Home  Environment   Living Arrangements Spouse/significant other   Available Help at Discharge Family   Type of Home House   Home Access Stairs   Entrance Stairs-Rails Can reach both   Entrance Stairs-Number of Steps 2   Home Layout One level   Bathroom Shower/Tub Tub/Shower unit;Curtain   Shower/tub characteristics Nutritional therapist Tub bench;Bedside commode   Additional Comments Patient reports having a bedside commode, but prefering to walk intobathroom for toileting, even at night.   Lives With Spouse   Prior Function   Level of Independence Independent with basic ADLs;Independent with household mobility without device;Independent with community mobility without  device;Independent with homemaking with ambulation;Independent with gait;Independent with transfers   Vocation Full time employment   Engineer, maintenance at [employer], golf, fishing   ADL   Eating/Feeding Supervision/safety   Where assessed - Eating/feeding Other (comment)  per patient - he occasionally pockets food in left cheek   Grooming Shaving   Grooming Patient Percentage 70%   Where Assess - Grooming Other (comment)  Patient reported he shaved himself this am, missed left face   Lower Body Dressing Moderate assistance   Lower Body  Dressing Patient Percentage 50%   Where Assessd - Lower Body Dressing Unsupported sit to stand   Toilet Tranfer Supervision/safety   Toilet Transfer: Patient Percentage 90%   Tub/Shower Transfer Supervision/safety   Tub/Shower Transfer Details (indicate cue type and reason wife supervises transfers into and out of tub per patient   Hydrologist tub bench;Grab bars  hand held shower head   IADL   Prior Level of Function Meal Prep independent   Meal Prep Needs to have meals prepared and served   Prior Level of Function International aid/development worker Relies on family or friends for transportation   Medication Management Has difficulty remembering to take medication   Financial Management Requires assistance   Written Expression   Dominant Hand Right   Vision - History   Baseline Vision Wears glasses only for reading   Patient Visual Report --  "keep telling me to look left"   Vision Assessment   Eye Alignment Within Functional Limits   Vision Assessment Vision impaired  _ to be further tested in functional context   Ocular Range of Motion Impaired to be futher tested in functional context   Alignment/Gaze Preference Gaze Right   Tracking/Visual Pursuits Decreased smoothness of horizontal tracking   Saccades Additional eye shifts occurred during testing   Convergence Impaired (comment)   Acuity Able to read clock/calendar on wall without difficulty   Comment very strong right gaze preference.  Difficulty sustaining eyes in midline   Cognition   Overall Cognitive Status Impaired/Different from baseline   Area of Impairment Attention;Following commands;Safety/judgement;Awareness;Memory   Current Attention Level Focused   Attention Comments difficulty attending in gym environment   Memory --  memory limited per patient - attentional deficits evident   Following Commands Follows one step commands inconsistently   Following Command Comments  cueing to stay on task with a one miute timed test   Safety/Judgement Decreased awareness of safety;Decreased awareness of deficits   Safety and Judgement Comments Patient lacks awareness of safety issues, 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     Observation/Other Assessments   Focus on Therapeutic Outcomes (FOTO)  Not completed   Outcome Measures box and blocks   Sensation   Light Touch Impaired by gross assessment   Stereognosis Impaired by gross assessment   Hot/Cold Impaired by gross assessment   Proprioception Impaired by gross assessment   Additional Comments diffuse sensory loss   Coordination   Gross Motor Movements are Fluid and Coordinated No   Fine Motor Movements are Fluid and Coordinated No   9 Hole Peg Test --  unable   Box and Blocks right 23 / left 1   Coordination limited ability to isolate indiviual digits in left hand, forearm  motion limited   Perception   Perception Impaired   Inattention/Neglect Does not attend to left visual field   Praxis   Praxis Not tested   Edema   Edema dorsum of left hand with mild swelling   Tone   Assessment Location Left Upper Extremity  hypertonic mod- bicep   ROM / Strength   AROM / PROM / Strength AROM   AROM   Overall AROM  Deficits   Overall AROM Comments left shoulder limited to ~ 130 flexion, forearm, wrist and digits limited to approxiamtely 60 % full available range of motion.    Hand Function   Left Hand Gross Grasp Impaired   Left Hand Grip (lbs) uanble   Left Hand Lateral Pinch --  unable                         OT Education - 08/13/14 1808    Education provided Yes   Education Details reviewed outline for OT plan of care and possible goals for improved independence with ADL/IADL, and improved use of left arm.    Person(s) Educated Patient   Methods Explanation   Comprehension Verbal cues required          OT Short Term Goals - 08/13/14 1819    OT SHORT TERM GOAL #1   Title Patient will independently don socks   Time 4   Period Weeks   Status New   OT SHORT TERM GOAL #2   Title Patient will demonstrate sufficient pincer grasp to tighten shoelace with left hand   Time 4   Period Weeks   Status New   OT SHORT TERM GOAL #3   Title Patient will visually attend to activity presented in midline for 10 minutes in minimally distracting environment   Time 4   Period Weeks   Status New   OT SHORT TERM GOAL #4   Title Patient will demonstrate ability to reach left arm overhead (at least 140 degrees shoulder flexion) while upright to hit target and repeat 5 times in preparation for overhead reach / release or grasp   OT SHORT TERM GOAL #5   Title Patient will prepare a simple cold snack following 2 step directives in minimally distracting environment           OT Long Term Goals - 08/13/14 1824    OT LONG TERM GOAL #1   Title Patient will dress himself with modified independence   Time 8   Period Weeks   Status New   OT LONG TERM GOAL #2   Title Patient will shower himself, including transfer into and out of tub with modified independence   Time 8   Period Weeks   Status New   OT LONG TERM GOAL #3   Title Patient will complete grooming tasks independently, using visula cues for thoroughness (eg - shaving using mirror for guidance)   Time 8   Period Weeks   Status New   OT LONG TERM GOAL #4   Title Patient will effectively use long handled gardeing tool to hoe/rake garden in standing using bilateral upper extremities   Time 8   Period Weeks   Status New   OT LONG TERM GOAL #5   Title Patient will demonstrate safe use of knife for cutting food at a meal, or during meal preparation.  9This goal requires effective use of two hands in this task)   Time 8   Period Weeks  Status New                Plan - 08/13/14 1816    Pt will benefit from skilled therapeutic intervention in order to improve on the following deficits (Retired) Decreased balance;Decreased cognition;Decreased coordination;Decreased endurance;Decreased knowledge of precautions;Decreased knowledge of use of DME;Decreased range of motion;Decreased safety awareness;Impaired perceived functional ability;Increased edema;Decreased strength;Impaired sensation;Impaired tone;Impaired UE functional use;Impaired vision/preception   Rehab Potential Good   OT Frequency 2x / week   OT Duration 8 weeks   OT Treatment/Interventions Self-care/ADL training;Electrical Stimulation;Therapeutic exercise;Neuromuscular education;DME and/or AE instruction;Manual Therapy;Functional Mobility Training;Splinting;Therapeutic exercises;Therapeutic activities;Cognitive remediation/compensation;Visual/perceptual remediation/compensation;Patient/family education;Balance training   Plan Improve independence with LB dressing; tying shoes, donning socks.  Increase visual attention to left hand with grasp / release, MOCA,    Consulted and Agree with Plan of Care Patient        Problem List Patient Active Problem List   Diagnosis Date Noted  . Left spastic hemiparesis 08/07/2014  . Left-sided neglect 08/07/2014  . Right middle cerebral artery stroke 08/01/2014  . Stroke with cerebral ischemia   . CVA (cerebral vascular accident) 07/30/2014  . Tobacco dependence   . Carotid artery occlusion with infarction   . ETOH abuse   . Acute thrombotic stroke 07/25/2014  . CVA (cerebral infarction) 07/25/2014  . Acute ischemic stroke   . Essential hypertension   . Hyperlipidemia   . Tobacco abuse   . Essential hypertension, benign 10/11/2013  . Mixed hyperlipidemia 10/11/2013    Collier SalinaGellert, Kristin M, OTR/L 08/13/2014, 6:37 PM  [facility] [address] [place], [place],  [id number] Phone: [phone]   Fax:  [phone]

## 2014-08-14 ENCOUNTER — Encounter: Payer: Self-pay | Admitting: Physical Therapy

## 2014-08-14 ENCOUNTER — Encounter: Payer: Self-pay | Admitting: Cardiology

## 2014-08-14 NOTE — Therapy (Signed)
[facility] [address] [place], [place], [address] Phone: [phone]   Fax:  [phone]  Physical Therapy Evaluation  Patient Details  Name: [name] MRN: [id number] Date of Birth: Mar 05, 1955 Referring Provider:  Erick Colace, MD  Encounter Date: 08/13/2014      PT End of Session - 08/14/14 0838    Visit Number 1   Number of Visits 7   Date for PT Re-Evaluation 09/12/14   Authorization Type BCBS   Authorization - Visit Number 1   Authorization - Number of Visits 30   PT Start Time 1403   PT Stop Time 1447   PT Time Calculation (min) 44 min      Past Medical History  Diagnosis Date  . Arthritis   . Hypertension   . Hyperlipidemia     Past Surgical History  Procedure Laterality Date  . Loop recorder implant  07/27/14    LOOP REVEAL LINQ XLK44 - [id number]  . Tee without cardioversion N/A 07/27/2014    Procedure: TRANSESOPHAGEAL ECHOCARDIOGRAM (TEE);  Surgeon: Chrystie Nose, MD;  Location: [facility] ENDOSCOPY;  Service: Cardiovascular;  Laterality: N/A;  . Ep implantable device N/A 07/27/2014    Procedure: Loop Recorder Insertion;  Surgeon: Marinus Maw, MD;  Location[facility] INVASIVE CV LAB;  Service: Cardiovascular;  Laterality: N/A;    There were no vitals filed for this visit.  Visit Diagnosis:  Hemiparesis affecting nondominant side as late effect of cerebrovascular accident - Plan: PT plan of care cert/re-cert  Abnormality of gait - Plan: PT plan of care cert/re-cert  Left leg weakness - Plan: PT plan of care cert/re-cert  Sensation alteration, late effect of cerebrovascular disease - Plan: PT plan of care cert/re-cert      Subjective Assessment - 08/14/14 0822    Subjective Pt. is a 59 year old gentleman s/p R CVA x 2 ( occurring on 07-25-14 and 6-15); pt was admitted to [facility] on 08-01-14 and discharged on 08-10-14 with 2nd CVA; pt. is referred for PT, OT and ST; pt. has  significantly decr. sensation in LUE and impaired sensation with apparent decr. proprioception in LLE; pt denies significant gait and balance problems; states his biggest problem is LUE weakness                                                                                                                         Pertinent History h/o CVA x 2; HTN; h/o ETOH abuse   Patient Stated Goals Improve balance and gait    Currently in Pain? No/denies            [facility] PT Assessment - 08/14/14 0001    Assessment   Medical Diagnosis R CVA  x 2   Onset Date/Surgical Date 07/25/14  and 08-01-14   Restrictions   Weight Bearing Restrictions No   Balance Screen   Has the patient fallen in the past 6 months No   Has  the patient had a decrease in activity level because of a fear of falling?  No   Is the patient reluctant to leave their home because of a fear of falling?  No   Home Environment   Living Environment Private residence   Type of Home House   Home Access Stairs to enter   Entrance Stairs-Number of Steps 2   Entrance Stairs-Rails Can reach both   Home Layout One level   Prior Function   Level of Independence Independent with basic ADLs;Independent with household mobility without device;Independent with community mobility without device;Independent with homemaking with ambulation;Independent with gait;Independent with transfers   Vocation Full time employment   Engineer, maintenanceVocation Requirements butcher at [employer]   Leisure gardening, golf, fishing   ROM / Strength   AROM / PROM / Strength Strength   Strength   Overall Strength Within functional limits for tasks performed  for LLE: L hamstrings 3+/5 and L plantarflexors 3+/5   Transfers   Transfers Sit to Stand   Transfers Yes   Ambulation/Gait   Ambulation/Gait Yes   Ambulation/Gait Assistance 6: Modified independent (Device/Increase time)   Ambulation Distance (Feet) 200 Feet   Assistive device --  none   Gait Pattern Within Functional  Limits   Ambulation Surface Level;Indoor   Gait velocity 3.07   Stairs Yes   Stairs Assistance 6: Modified independent (Device/Increase time)   Stair Management Technique One rail Right   Number of Stairs 4   Height of Stairs 6   Berg Balance Test   Sit to Stand Able to stand without using hands and stabilize independently   Standing Unsupported Able to stand safely 2 minutes   Sitting with Back Unsupported but Feet Supported on Floor or Stool Able to sit safely and securely 2 minutes   Stand to Sit Sits safely with minimal use of hands   Transfers Able to transfer safely, minor use of hands   Standing Unsupported with Eyes Closed Able to stand 10 seconds safely   Standing Ubsupported with Feet Together Able to place feet together independently and stand 1 minute safely   From Standing, Reach Forward with Outstretched Arm Can reach confidently >25 cm (10")   From Standing Position, Pick up Object from Floor Able to pick up shoe safely and easily   From Standing Position, Turn to Look Behind Over each Shoulder Looks behind from both sides and weight shifts well   Turn 360 Degrees Able to turn 360 degrees safely one side only in 4 seconds or less   Standing Unsupported, Alternately Place Feet on Step/Stool Able to complete 4 steps without aid or supervision   Standing Unsupported, One Foot in Front Able to place foot tandem independently and hold 30 seconds   Standing on One Leg Able to lift leg independently and hold 5-10 seconds   Total Score 52   Timed Up and Go Test   Normal TUG (seconds) 10.82                             PT Short Term Goals - 08/14/14 1030    PT SHORT TERM GOAL #1   Title same as LTG's           PT Long Term Goals - 08/14/14 1030    PT LONG TERM GOAL #1   Title Pt. will amb. 1000' on all surfaces independently without LOB.   Time 3   Period Weeks  Status New   PT LONG TERM GOAL #2   Title Negotiate 4 steps without use of rail  using a step over step sequence to demo improved balance   Time 3   Period Weeks   Status New   PT LONG TERM GOAL #3   Title Complete Functional gait assessment and establish goal as appropriate   Time 3   Period Weeks   Status New   PT LONG TERM GOAL #4   Title Independent in HEP for L hamstring and plantarflexor strengthening   Time 3   Period Weeks   Status New               Plan - 08/14/14 1037    Pt will benefit from skilled therapeutic intervention in order to improve on the following deficits Abnormal gait;Decreased coordination;Decreased balance;Impaired sensation;Decreased mobility;Decreased cognition;Decreased strength;Decreased activity tolerance   Rehab Potential Good   PT Frequency 2x / week   PT Duration 3 weeks   PT Treatment/Interventions ADLs/Self Care Home Management;Functional mobility training;Stair training;Gait training;Therapeutic activities;Therapeutic exercise;Balance training;Neuromuscular re-education;Patient/family education   PT Next Visit Plan do Dynamic Gait index; begin HEP - for high level balance and L hamstrings and plantarflexor strengthening   PT Home Exercise Plan see above   Consulted and Agree with Plan of Care Patient         Problem List Patient Active Problem List   Diagnosis Date Noted  . Left spastic hemiparesis 08/07/2014  . Left-sided neglect 08/07/2014  . Right middle cerebral artery stroke 08/01/2014  . Stroke with cerebral ischemia   . CVA (cerebral vascular accident) 07/30/2014  . Tobacco dependence   . Carotid artery occlusion with infarction   . ETOH abuse   . Acute thrombotic stroke 07/25/2014  . CVA (cerebral infarction) 07/25/2014  . Acute ischemic stroke   . Essential hypertension   . Hyperlipidemia   . Tobacco abuse   . Essential hypertension, benign 10/11/2013  . Mixed hyperlipidemia 10/11/2013    Kary Kos, PT 08/14/2014, 10:41 AM  [facility] [address] [place], [place], [address] Phone: [phone]   Fax:  [phone]

## 2014-08-15 ENCOUNTER — Ambulatory Visit: Payer: BLUE CROSS/BLUE SHIELD

## 2014-08-15 DIAGNOSIS — I69898 Other sequelae of other cerebrovascular disease: Secondary | ICD-10-CM | POA: Diagnosis not present

## 2014-08-15 DIAGNOSIS — R41841 Cognitive communication deficit: Secondary | ICD-10-CM

## 2014-08-15 NOTE — Therapy (Signed)
[facility] [address] [place], [place], [address] Phone: [phone]   Fax:  [phone]  Speech Language Pathology Evaluation  Patient Details  Name: [name] MRN: [id number] Date of Birth: 1955/05/14 Referring Provider:  Erick ColaceKirsteins, Andrew E, MD  Encounter Date: 08/15/2014      End of Session - 08/15/14 1630    Visit Number 1   Number of Visits 16   Date for SLP Re-Evaluation 10/15/14   Authorization - Visit Number 1   Authorization - Number of Visits 30   SLP Start Time 1404   SLP Stop Time  1450   SLP Time Calculation (min) 46 min   Activity Tolerance Patient tolerated treatment well      Past Medical History  Diagnosis Date  . Arthritis   . Hypertension   . Hyperlipidemia     Past Surgical History  Procedure Laterality Date  . Loop recorder implant  07/27/14    LOOP REVEAL LINQ [id number] - [id number]  . Tee without cardioversion N/A 07/27/2014    Procedure: TRANSESOPHAGEAL ECHOCARDIOGRAM (TEE);  Surgeon: Chrystie NoseKenneth C Hilty, MD;  [facility];  Service: Cardiovascular;  Laterality: N/A;  . Ep implantable device N/A 07/27/2014    Procedure: Loop Recorder Insertion;  Surgeon: Marinus MawGregg W Taylor, MD;  [facility];  Service: Cardiovascular;  Laterality: N/A;    There were no vitals filed for this visit.  Visit Diagnosis: Cognitive communication deficit - Plan: SLP plan of care cert/re-cert      Subjective Assessment - 08/15/14 1406    Subjective "I have come to realize that everything they do with me has a purpose." PT demonstrates some anticipatory awareness re: safety with balance and lt UE neglect.            SLP Evaluation [facility] - 08/15/14 1409    SLP Visit Information   SLP Received On 08/15/14   Onset Date 07-25-14   Medical Diagnosis rt CVA   Pain Assessment   Currently in Pain? No/denies   General Information   Other Pertinent Information Pt is a 59 yo male admitted  07-25-14 with worsening left facial droop, slurred speech and left sided weakness.  Pmhx significant for hyperlipidemia, CVA last week, HTN and alcohol abuse per chart review as pt stated he drinks 5-18 beers per day and was a current smoker during last admit. MR head on 07/25/14 indicated acute non-hemorrhagic infarct of right frontal operculum and anterior portion of insular cortex; Acute punctate infarct in high right parietal lobe near vertex.  Acute moderate RIGHT middle cerebral artery territory infarct  New MRI during this admit showed Acute moderate RIGHT middle cerebral artery territory infarct.    Cognition   Overall Cognitive Status Impaired/Different from baseline   Area of Impairment Attention;Awareness;Memory   Current Attention Level Focused;Sustained   Safety and Judgement Comments Pt provides only physical deficits when asked specifically re: cogntive deficits.   Attention Sustained;Focused   Motor Speech   Overall Motor Speech Impaired   Respiration Impaired   Level of Impairment Conversation   Phonation Low vocal intensity   Resonance Within functional limits   Articulation Impaired   Level of Impairment Conversation   Intelligibility Intelligible   Motor Planning Witnin functional limits   Effective Techniques Increased vocal intensity;Slow rate   Standardized Assessments   Standardized Assessments  Other Assessment   Other Assessment Hopkins Verbal Learning Test (5/12, 7/12, 7/12 for recall); 11/12 recognition  SLP Short Term Goals - 08/15/14 1634    SLP SHORT TERM GOAL #1   Title pt will attend to 10 minute cognitive communication task in a mod distracting environment with rare min A back to task   Time 4   Period Weeks   Status New   SLP SHORT TERM GOAL #2   Title pt will complete simple verbal time, money, and math problems 75% success and rare min A   Time 4   Period Weeks   Status New   SLP SHORT TERM GOAL #3   Title pt will demonstrate  intellectual awareness to cognitive communicaiton deficits by telling SLP 3 non-physical deficits   Time 4   Period Weeks   Status New          SLP Long Term Goals - 08/15/14 1636    SLP LONG TERM GOAL #1   Title pt will demo alternating attention in at least two cognitive communication tasks by having 85% success on each task with rare min A   Time 8   Period Weeks   Status New   SLP LONG TERM GOAL #2   Title pt will demonstrate emergent awareness in mod complex cognitive linguistic tasks by correcting errors 90% of opportunities   Time 8   Period Weeks   Status New   SLP LONG TERM GOAL #3   Title pt will give appropriate solutions verbally to functional problems 95% of the time   Time 8   Period Weeks   Status New   SLP LONG TERM GOAL #4   Title pt to use some kind of memory compensation system twice during a therapy session   Time 8   Period Weeks   Status New          Plan - 08/15/14 1644    Duration --  8 weeks (or 16 visits)        Problem List Patient Active Problem List   Diagnosis Date Noted  . Left spastic hemiparesis 08/07/2014  . Left-sided neglect 08/07/2014  . Right middle cerebral artery stroke 08/01/2014  . Stroke with cerebral ischemia   . CVA (cerebral vascular accident) 07/30/2014  . Tobacco dependence   . Carotid artery occlusion with infarction   . ETOH abuse   . Acute thrombotic stroke 07/25/2014  . CVA (cerebral infarction) 07/25/2014  . Acute ischemic stroke   . Essential hypertension   . Hyperlipidemia   . Tobacco abuse   . Essential hypertension, benign 10/11/2013  . Mixed hyperlipidemia 10/11/2013    [facility] , MS, CCC-SLP  08/15/2014, 4:44 PM  [facility] [address] [place], [place], [address] Phone: [phone]   Fax:  [phone]

## 2014-08-16 ENCOUNTER — Ambulatory Visit: Payer: BLUE CROSS/BLUE SHIELD | Admitting: Physical Therapy

## 2014-08-16 ENCOUNTER — Ambulatory Visit: Payer: BLUE CROSS/BLUE SHIELD | Admitting: Occupational Therapy

## 2014-08-16 ENCOUNTER — Ambulatory Visit: Payer: BLUE CROSS/BLUE SHIELD | Admitting: Speech Pathology

## 2014-08-16 ENCOUNTER — Encounter: Payer: Self-pay | Admitting: Physical Therapy

## 2014-08-16 ENCOUNTER — Encounter: Payer: Self-pay | Admitting: Occupational Therapy

## 2014-08-16 DIAGNOSIS — R269 Unspecified abnormalities of gait and mobility: Secondary | ICD-10-CM

## 2014-08-16 DIAGNOSIS — I69359 Hemiplegia and hemiparesis following cerebral infarction affecting unspecified side: Secondary | ICD-10-CM

## 2014-08-16 DIAGNOSIS — I69998 Other sequelae following unspecified cerebrovascular disease: Secondary | ICD-10-CM

## 2014-08-16 DIAGNOSIS — R4189 Other symptoms and signs involving cognitive functions and awareness: Secondary | ICD-10-CM

## 2014-08-16 DIAGNOSIS — R41841 Cognitive communication deficit: Secondary | ICD-10-CM

## 2014-08-16 DIAGNOSIS — H539 Unspecified visual disturbance: Secondary | ICD-10-CM

## 2014-08-16 DIAGNOSIS — R41840 Attention and concentration deficit: Secondary | ICD-10-CM

## 2014-08-16 DIAGNOSIS — R209 Unspecified disturbances of skin sensation: Secondary | ICD-10-CM

## 2014-08-16 DIAGNOSIS — R29898 Other symptoms and signs involving the musculoskeletal system: Secondary | ICD-10-CM

## 2014-08-16 DIAGNOSIS — I69898 Other sequelae of other cerebrovascular disease: Secondary | ICD-10-CM | POA: Diagnosis not present

## 2014-08-16 NOTE — Therapy (Signed)
[facility] [address] [place], [place], [address] Phone: [phone]   Fax:  [phone]  Occupational Therapy Treatment  Patient Details  Name: [name] MRN: [id number] Date of Birth: 02/12/1956 Referring Provider:  Maurice March, MD  Encounter Date: 08/16/2014      OT End of Session - 08/16/14 1704    Visit Number 2   Number of Visits 17   Date for OT Re-Evaluation 10/10/14   Authorization Type BCBS   OT Start Time 1532   OT Stop Time 1615   OT Time Calculation (min) 43 min   Activity Tolerance Patient tolerated treatment well      Past Medical History  Diagnosis Date  . Arthritis   . Hypertension   . Hyperlipidemia     Past Surgical History  Procedure Laterality Date  . Loop recorder implant  07/27/14    LOOP REVEAL LINQ [id number] - [id number]  . Tee without cardioversion N/A 07/27/2014    Procedure: TRANSESOPHAGEAL ECHOCARDIOGRAM (TEE);  Surgeon: Chrystie Nose, MD;  Location: [facility] ENDOSCOPY;  Service: Cardiovascular;  Laterality: N/A;  . Ep implantable device N/A 07/27/2014    Procedure: Loop Recorder Insertion;  Surgeon: Marinus Maw, MD;  Location: [facility] INVASIVE CV LAB;  Service: Cardiovascular;  Laterality: N/A;    There were no vitals filed for this visit.  Visit Diagnosis:  Hemiparesis affecting nondominant side as late effect of cerebrovascular accident  Sensation alteration, late effect of cerebrovascular disease  Impaired cognition  Inattention  Visual disturbance      Subjective Assessment - 08/16/14 1535    Subjective  I get pins and needles in my arm when I take my shower   Pertinent History see snapshot   Patient Stated Goals return to gardening, independence with self care.   Currently in Pain? No/denies                      OT Treatments/Exercises (OP) - 08/16/14 1658    Neurological Re-education Exercises   Other Exercises 1 Neuro re ed to address  improved functional use of LUE. Pt with significant L neglect/R gaze preference, severe hemi sensory deficit and cognitive deficit which all impact ability to use available isolated movement in hand. Treatment focused on presenting activities to pt' s left, reach and place activities, and functional tasks requiring pt to use R hand. Pt required mod vc's to attend to L field, max cues to use vision to compensate for neglect/sensory loss. When cued pt able to use hand as gross assist for basic tasks however as soon as pt looks away from hand, he no longer demonstrates use of L hand.                   OT Short Term Goals - 08/16/14 1702    OT SHORT TERM GOAL #1   Title Patient will independently don socks   Time 4   Period Weeks   Status On-going   OT SHORT TERM GOAL #2   Title Patient will demonstrate sufficient pincer grasp to tighten shoelace with left hand   Time 4   Period Weeks   Status On-going   OT SHORT TERM GOAL #3   Title Patient will visually attend to activity presented in midline for 10 minutes in minimally distracting environment   Time 4   Period Weeks   Status On-going   OT SHORT TERM GOAL #4   Title Patient will demonstrate  ability to reach left arm overhead (at least 140 degrees shoulder flexion) while upright to hit target and repeat 5 times in preparation for overhead reach / release or grasp   Status On-going   OT SHORT TERM GOAL #5   Title Patient will prepare a simple cold snack following 2 step directives in minimally distracting environment   Status On-going           OT Long Term Goals - 08/16/14 1702    OT LONG TERM GOAL #1   Title Patient will dress himself with modified independence   Time 8   Period Weeks   Status On-going   OT LONG TERM GOAL #2   Title Patient will shower himself, including transfer into and out of tub with modified independence   Time 8   Period Weeks   Status On-going   OT LONG TERM GOAL #3   Title Patient will  complete grooming tasks independently, using visula cues for thoroughness (eg - shaving using mirror for guidance)   Time 8   Period Weeks   Status On-going   OT LONG TERM GOAL #4   Title Patient will effectively use long handled gardeing tool to hoe/rake garden in standing using bilateral upper extremities   Time 8   Period Weeks   Status On-going   OT LONG TERM GOAL #5   Title Patient will demonstrate safe use of knife for cutting food at a meal, or during meal preparation.  9This goal requires effective use of two hands in this task)   Time 8   Period Weeks   Status On-going               Plan - 08/16/14 1702    Clinical Impression Statement Pt with slow progress toward goals. Pt is motivated to use L hand however finds it very frustrating. Pt benefits from cues to vision to compensate for sensory loss and from signficant repetition for motor learning.   Pt will benefit from skilled therapeutic intervention in order to improve on the following deficits (Retired) Decreased balance;Decreased cognition;Decreased coordination;Decreased endurance;Decreased knowledge of precautions;Decreased knowledge of use of DME;Decreased range of motion;Decreased safety awareness;Impaired perceived functional ability;Increased edema;Decreased strength;Impaired sensation;Impaired tone;Impaired UE functional use;Impaired vision/preception   Rehab Potential Good   OT Frequency 2x / week   OT Duration 8 weeks   OT Treatment/Interventions Self-care/ADL training;Electrical Stimulation;Therapeutic exercise;Neuromuscular education;DME and/or AE instruction;Manual Therapy;Functional Mobility Training;Splinting;Therapeutic exercises;Therapeutic activities;Cognitive remediation/compensation;Visual/perceptual remediation/compensation;Patient/family education;Balance training   Plan neuro re ed to LUE, address ADL goals, MOCHA   Consulted and Agree with Plan of Care Patient        Problem List Patient  Active Problem List   Diagnosis Date Noted  . Left spastic hemiparesis 08/07/2014  . Left-sided neglect 08/07/2014  . Right middle cerebral artery stroke 08/01/2014  . Stroke with cerebral ischemia   . CVA (cerebral vascular accident) 07/30/2014  . Tobacco dependence   . Carotid artery occlusion with infarction   . ETOH abuse   . Acute thrombotic stroke 07/25/2014  . CVA (cerebral infarction) 07/25/2014  . Acute ischemic stroke   . Essential hypertension   . Hyperlipidemia   . Tobacco abuse   . Essential hypertension, benign 10/11/2013  . Mixed hyperlipidemia 10/11/2013    Norton PastelPulaski, Keniyah Gelinas Halliday, OTR/L 08/16/2014, 5:05 PM  [facility] [address] [place], [place], [address] Phone: [phone]   Fax:  [phone]

## 2014-08-16 NOTE — Therapy (Signed)
[facility] [address] [place], [place], [address] Phone: [phone]   Fax:  [phone]  Physical Therapy Treatment  Patient Details  Name: [name] MRN: [id number] Date of Birth: [birth year] Referring Provider:  Maurice March, MD  Encounter Date: 08/16/2014      PT End of Session - 08/16/14 1252    Visit Number 2   Number of Visits 7   Date for PT Re-Evaluation 09/12/14   Authorization Type BCBS   Authorization - Visit Number 2   Authorization - Number of Visits 30   PT Start Time 1145   PT Stop Time 1231   PT Time Calculation (min) 46 min      Past Medical History  Diagnosis Date  . Arthritis   . Hypertension   . Hyperlipidemia     Past Surgical History  Procedure Laterality Date  . Loop recorder implant  07/27/14    LOOP REVEAL LINQ [id number] - [id number]  . Tee without cardioversion N/A 07/27/2014    Procedure: TRANSESOPHAGEAL ECHOCARDIOGRAM (TEE);  Surgeon: Chrystie Nose, MD;  Location: [facility] ENDOSCOPY;  Service: Cardiovascular;  Laterality: N/A;  . Ep implantable device N/A 07/27/2014    Procedure: Loop Recorder Insertion;  Surgeon: Marinus Maw, MD;  Location[facility] INVASIVE CV LAB;  Service: Cardiovascular;  Laterality: N/A;    There were no vitals filed for this visit.  Visit Diagnosis:  Hemiparesis affecting nondominant side as late effect of cerebrovascular accident  Abnormality of gait  Left leg weakness      Subjective Assessment - 08/16/14 1235    Subjective Pt. reports no major changes since initial eval earlier this week; states he has noticed that L foot drags a little   Pertinent History h/o CVA x 2; HTN; h/o ETOH abuse   Patient Stated Goals Improve balance and gait    Currently in Pain? No/denies            [facility] PT Assessment - 08/16/14 1241    Functional Gait  Assessment   Gait assessed  Yes   Gait Level Surface Walks 20 ft in less than 7 sec but greater than 5.5  sec, uses assistive device, slower speed, mild gait deviations, or deviates 6-10 in outside of the 12 in walkway width.  6.60   Change in Gait Speed Able to change speed, demonstrates mild gait deviations, deviates 6-10 in outside of the 12 in walkway width, or no gait deviations, unable to achieve a major change in velocity, or uses a change in velocity, or uses an assistive device.   Gait with Horizontal Head Turns Performs head turns smoothly with slight change in gait velocity (eg, minor disruption to smooth gait path), deviates 6-10 in outside 12 in walkway width, or uses an assistive device.   Gait with Vertical Head Turns Performs task with slight change in gait velocity (eg, minor disruption to smooth gait path), deviates 6 - 10 in outside 12 in walkway width or uses assistive device   Gait and Pivot Turn Pivot turns safely within 3 sec and stops quickly with no loss of balance.   Step Over Obstacle Is able to step over one shoe box (4.5 in total height) without changing gait speed. No evidence of imbalance.   Gait with Narrow Base of Support Ambulates 7-9 steps.   Gait with Eyes Closed Walks 20 ft, uses assistive device, slower speed, mild gait deviations, deviates 6-10 in outside 12 in walkway width. Ambulates 20  ft in less than 9 sec but greater than 7 sec.   Ambulating Backwards Walks 20 ft, no assistive devices, good speed, no evidence for imbalance, normal gait   Steps Alternating feet, must use rail.   Total Score 22                     [facility] Adult PT Treatment/Exercise - 08/16/14 1244    Knee/Hip Exercises: Aerobic   Stationary Bike SciFit level 1.5 x 6" with LE's only   Knee/Hip Exercises: Machines for Strengthening   Cybex Leg Press bil. LE's 50# 10 reps; LLE only 35# 10 reps   Knee/Hip Exercises: Standing   Heel Raises Left;1 set;10 reps   Heel Raises Limitations weakness   Knee Flexion AROM;Strengthening;Left;1 set  with green theraband   Hip Extension  AROM;Left;1 set  with green theraband in standing   Other Standing Knee Exercises hip extension prone with flexed knee in prone position x 10 reps each     TherEx; prone - L hip extension with knee flexed x 10 reps 2 sets;  L hip abductor strength 4/5 L hamstring strength 4/5  - tested in prone position   Neuro re-ed; ambulating on tip toes 20'x 1 rep; ambulating on heels 20' x 2 reps for dorsiflexion strengthening with S          PT Education - 08/16/14 1251    Education provided Yes   Education Details LLE strengthening   Person(s) Educated Patient   Methods Explanation;Demonstration;Handout   Comprehension Verbalized understanding;Returned demonstration          PT Short Term Goals - 08/14/14 1030    PT SHORT TERM GOAL #1   Title same as LTG's           PT Long Term Goals - 08/14/14 1030    PT LONG TERM GOAL #1   Title Pt. will amb. 1000' on all surfaces independently without LOB.   Time 3   Period Weeks   Status New   PT LONG TERM GOAL #2   Title Negotiate 4 steps without use of rail using a step over step sequence to demo improved balance   Time 3   Period Weeks   Status New   PT LONG TERM GOAL #3   Title Complete Functional gait assessment and establish goal as appropriate   Time 3   Period Weeks   Status New   PT LONG TERM GOAL #4   Title Independent in HEP for L hamstring and plantarflexor strengthening   Time 3   Period Weeks   Status New               Plan - 08/16/14 1252    Clinical Impression Statement Pt. presents with weakness in L dorsiflexors - with eccentric control - with some slight foot slap noted in stance; also weakness noted in L hip extensors   Pt will benefit from skilled therapeutic intervention in order to improve on the following deficits Abnormal gait;Decreased coordination;Decreased balance;Impaired sensation;Decreased mobility;Decreased cognition;Decreased strength;Decreased activity tolerance   Rehab Potential  Good   PT Frequency 2x / week   PT Duration 3 weeks   PT Treatment/Interventions ADLs/Self Care Home Management;Functional mobility training;Stair training;Gait training;Therapeutic activities;Therapeutic exercise;Balance training;Neuromuscular re-education;Patient/family education   PT Next Visit Plan check HEP; cont with balance and gait   PT Home Exercise Plan see above   Consulted and Agree with Plan of Care Patient  Problem List Patient Active Problem List   Diagnosis Date Noted  . Left spastic hemiparesis 08/07/2014  . Left-sided neglect 08/07/2014  . Right middle cerebral artery stroke 08/01/2014  . Stroke with cerebral ischemia   . CVA (cerebral vascular accident) 07/30/2014  . Tobacco dependence   . Carotid artery occlusion with infarction   . ETOH abuse   . Acute thrombotic stroke 07/25/2014  . CVA (cerebral infarction) 07/25/2014  . Acute ischemic stroke   . Essential hypertension   . Hyperlipidemia   . Tobacco abuse   . Essential hypertension, benign 10/11/2013  . Mixed hyperlipidemia 10/11/2013    Kary Kos, PT 08/16/2014, 12:56 PM  [facility] [address] [place], [place], [address] Phone: [phone]   Fax:  [phone]

## 2014-08-16 NOTE — Patient Instructions (Addendum)
Heel Raise: Unilateral (Standing)   Balance on left foot, then rise on ball of foot. Repeat ___10-15_ times per set. Do __1__ sets per session. Do _1-2___ sessions per day.  http://orth.exer.us/40   Copyright  VHI. All rights reserved.  EXTENSION: Standing (Active)   Stand, both feet flat. Draw right leg behind body as far as possible. Use GREEN THERABAND Complete _1__ sets of __10-15_ repetitions. Perform _1__ sessions per day.  http://gtsc.exer.us/76   Copyright  VHI. All rights reserved.  Toe Up   Gently rise up on toes and back on heels.  WITH THERABAND Repeat __15__ times. Do _1-2___ sessions per day.  http://gt2.exer.us/455   Copyright  VHI. All rights reserved.  Ambulate on toes and heels 15' x 3 reps each for balance/strengthening

## 2014-08-16 NOTE — Patient Instructions (Signed)
Homework providede

## 2014-08-16 NOTE — Therapy (Signed)
[facility] [address] [place], [place], [address] Phone: [phone]   Fax:  [phone]  Speech Language Pathology Treatment  Patient Details  Name: [name] MRN: [id number] Date of Birth: 1955-12-16 Referring Provider:  Maurice March, MD  Encounter Date: 08/16/2014      End of Session - 08/16/14 1448    Visit Number 2   Number of Visits 16   Date for SLP Re-Evaluation 10/15/14   Authorization - Visit Number 2   Authorization - Number of Visits 30   SLP Start Time 1403   SLP Stop Time  1449   SLP Time Calculation (min) 46 min   Activity Tolerance Patient tolerated treatment well      Past Medical History  Diagnosis Date  . Arthritis   . Hypertension   . Hyperlipidemia     Past Surgical History  Procedure Laterality Date  . Loop recorder implant  07/27/14    LOOP REVEAL LINQ ONG29 - [id number]  . Tee without cardioversion N/A 07/27/2014    Procedure: TRANSESOPHAGEAL ECHOCARDIOGRAM (TEE);  Surgeon: [name] Nose, MD;  Location: [facility] ENDOSCOPY;  Service: Cardiovascular;  Laterality: N/A;  . Ep implantable device N/A 07/27/2014    Procedure: Loop Recorder Insertion;  Surgeon: [name] Maw, MD;  Location: [facility] INVASIVE CV LAB;  Service: Cardiovascular;  Laterality: N/A;    There were no vitals filed for this visit.  Visit Diagnosis: Cognitive communication deficit      Subjective Assessment - 08/16/14 1408    Subjective "I'm here for more therapy"           SLP Evaluation [facility] - 08/15/14 1409    SLP Visit Information   SLP Received On 08/15/14   Onset Date 07-25-14   Medical Diagnosis rt CVA   Pain Assessment   Currently in Pain? No/denies   General Information   Other Pertinent Information Pt is a 59 yo male admitted 07-25-14 with worsening left facial droop, slurred speech and left sided weakness.  Pmhx significant for hyperlipidemia, CVA last week, HTN and alcohol abuse per chart review  as pt stated he drinks 5-18 beers per day and was a current smoker during last admit. MR head on 07/25/14 indicated acute non-hemorrhagic infarct of right frontal operculum and anterior portion of insular cortex; Acute punctate infarct in high right parietal lobe near vertex.  Acute moderate RIGHT middle cerebral artery territory infarct  New MRI during this admit showed Acute moderate RIGHT middle cerebral artery territory infarct.    Cognition   Overall Cognitive Status Impaired/Different from baseline   Area of Impairment Attention;Awareness;Memory   Current Attention Level Focused;Sustained   Safety and Judgement Comments Pt provides only physical deficits when asked specifically re: cogntive deficits.   Attention Sustained;Focused   Motor Speech   Overall Motor Speech Impaired   Respiration Impaired   Level of Impairment Conversation   Phonation Low vocal intensity   Resonance Within functional limits   Articulation Impaired   Level of Impairment Conversation   Intelligibility Intelligible   Motor Planning Witnin functional limits   Effective Techniques Increased vocal intensity;Slow rate   Standardized Assessments   Standardized Assessments  Other Assessment   Other Assessment Hopkins Verbal Learning Test (5/12, 7/12, 7/12 for recall); 11/12 recognition            ADULT SLP TREATMENT - 08/16/14 1409    General Information   Behavior/Cognition Alert;Cooperative;Pleasant mood   Treatment Provided   Treatment provided  Cognitive-Linquistic   Cognitive-Linquistic Treatment   Treatment focused on Cognition   Skilled Treatment Facilitated selective attention in moderately distracting/noisy envrionment with sequencing less frequent activities of daily living - Pt impulisve, required multiple verbal and tactile cues to read all steps proir to numbering their order. ST had to take pencil from pt in between sequencing topics. Pt required breaking down steps into choices of two on  occassion.  Simple money problem solving 70% accurate with usual min repeptition, breaking problem into small parts.   Assessment / Recommendations / Plan   Plan Continue with current plan of care   Progression Toward Goals   Progression toward goals Progressing toward goals          SLP Education - 08/16/14 1447    Education provided Yes   Education Details Cognitive impairments   Person(s) Educated Patient   Methods Explanation;Demonstration   Comprehension Verbalized understanding          SLP Short Term Goals - 08/16/14 1448    SLP SHORT TERM GOAL #1   Title pt will attend to 10 minute cognitive communication task in a mod distracting environment with rare min A back to task   Time 4   Period Weeks   Status On-going   SLP SHORT TERM GOAL #2   Title pt will complete simple verbal time, money, and math problems 75% success and rare min A   Time 4   Period Weeks   Status On-going   SLP SHORT TERM GOAL #3   Title pt will demonstrate intellectual awareness to cognitive communicaiton deficits by telling SLP 3 non-physical deficits   Time 4   Period Weeks   Status On-going          SLP Long Term Goals - 08/16/14 1448    SLP LONG TERM GOAL #1   Title pt will demo alternating attention in at least two cognitive communication tasks by having 85% success on each task with rare min A   Time 8   Period Weeks   Status On-going   SLP LONG TERM GOAL #2   Title pt will demonstrate emergent awareness in mod complex cognitive linguistic tasks by correcting errors 90% of opportunities   Time 8   Period Weeks   Status On-going   SLP LONG TERM GOAL #3   Title pt will give appropriate solutions verbally to functional problems 95% of the time   Time 8   Period Weeks   Status On-going   SLP LONG TERM GOAL #4   Title pt to use some kind of memory compensation system twice during a therapy session   Time 8   Period Weeks   Status On-going          Plan - 08/16/14 1447     Speech Therapy Frequency 2x / week   Treatment/Interventions Compensatory techniques;Functional tasks;Patient/family education;SLP instruction and feedback;Cognitive reorganization;Environmental controls;Internal/external aids   Potential Considerations Ability to learn/carryover information   Consulted and Agree with Plan of Care Patient        Problem List Patient Active Problem List   Diagnosis Date Noted  . Left spastic hemiparesis 08/07/2014  . Left-sided neglect 08/07/2014  . Right middle cerebral artery stroke 08/01/2014  . Stroke with cerebral ischemia   . CVA (cerebral vascular accident) 07/30/2014  . Tobacco dependence   . Carotid artery occlusion with infarction   . ETOH abuse   . Acute thrombotic stroke 07/25/2014  . CVA (cerebral infarction) 07/25/2014  .  Acute ischemic stroke   . Essential hypertension   . Hyperlipidemia   . Tobacco abuse   . Essential hypertension, benign 10/11/2013  . Mixed hyperlipidemia 10/11/2013   Clinton Sawyer MS, CCC-SLP  08/16/2014, 2:50 PM  [facility] [address] [place], [place], [address] Phone: [phone]   Fax:  [phone]

## 2014-08-22 ENCOUNTER — Ambulatory Visit: Payer: BLUE CROSS/BLUE SHIELD

## 2014-08-22 ENCOUNTER — Ambulatory Visit: Payer: BLUE CROSS/BLUE SHIELD | Attending: Physical Medicine & Rehabilitation | Admitting: Occupational Therapy

## 2014-08-22 DIAGNOSIS — R208 Other disturbances of skin sensation: Secondary | ICD-10-CM | POA: Diagnosis present

## 2014-08-22 DIAGNOSIS — R209 Unspecified disturbances of skin sensation: Secondary | ICD-10-CM

## 2014-08-22 DIAGNOSIS — R41841 Cognitive communication deficit: Secondary | ICD-10-CM | POA: Insufficient documentation

## 2014-08-22 DIAGNOSIS — H539 Unspecified visual disturbance: Secondary | ICD-10-CM | POA: Insufficient documentation

## 2014-08-22 DIAGNOSIS — I69898 Other sequelae of other cerebrovascular disease: Secondary | ICD-10-CM | POA: Insufficient documentation

## 2014-08-22 DIAGNOSIS — I69998 Other sequelae following unspecified cerebrovascular disease: Secondary | ICD-10-CM

## 2014-08-22 DIAGNOSIS — R29898 Other symptoms and signs involving the musculoskeletal system: Secondary | ICD-10-CM | POA: Insufficient documentation

## 2014-08-22 DIAGNOSIS — I69859 Hemiplegia and hemiparesis following other cerebrovascular disease affecting unspecified side: Secondary | ICD-10-CM | POA: Insufficient documentation

## 2014-08-22 DIAGNOSIS — R4189 Other symptoms and signs involving cognitive functions and awareness: Secondary | ICD-10-CM | POA: Diagnosis present

## 2014-08-22 DIAGNOSIS — R269 Unspecified abnormalities of gait and mobility: Secondary | ICD-10-CM | POA: Insufficient documentation

## 2014-08-22 DIAGNOSIS — R41840 Attention and concentration deficit: Secondary | ICD-10-CM | POA: Diagnosis present

## 2014-08-22 DIAGNOSIS — I69359 Hemiplegia and hemiparesis following cerebral infarction affecting unspecified side: Secondary | ICD-10-CM

## 2014-08-22 NOTE — Therapy (Signed)
[facility] [address] [place], [place], [address] Phone: [phone]   Fax:  [phone]  Speech Language Pathology Treatment  Patient Details  Name: [name] MRN: [id number] Date of Birth: [birth year] Referring Provider:  Maurice MarchMcPherson, Barbara B, MD  Encounter Date: 08/22/2014      End of Session - 08/22/14 1540    Visit Number 3   Number of Visits 16   Date for SLP Re-Evaluation 10/15/14   Authorization - Visit Number 3   Authorization - Number of Visits 30   SLP Start Time 1406   SLP Stop Time  1447   SLP Time Calculation (min) 41 min   Activity Tolerance Patient tolerated treatment well      Past Medical History  Diagnosis Date  . Arthritis   . Hypertension   . Hyperlipidemia     Past Surgical History  Procedure Laterality Date  . Loop recorder implant  07/27/14    LOOP REVEAL LINQ [id number] - [id number]  . Tee without cardioversion N/A 07/27/2014    Procedure: TRANSESOPHAGEAL ECHOCARDIOGRAM (TEE);  Surgeon: Chrystie NoseKenneth C Hilty, MD;  [facility];  Service: Cardiovascular;  Laterality: N/A;  . Ep implantable device N/A 07/27/2014    Procedure: Loop Recorder Insertion;  Surgeon: Marinus MawGregg W [place], MD;  [facility];  Service: Cardiovascular;  Laterality: N/A;    There were no vitals filed for this visit.  Visit Diagnosis: Cognitive communication deficit             ADULT SLP TREATMENT - 08/22/14 1409    General Information   Behavior/Cognition Alert;Cooperative;Pleasant mood   Treatment Provided   Treatment provided Cognitive-Linquistic   Pain Assessment   Pain Assessment No/denies pain   Cognitive-Linquistic Treatment   Treatment focused on Cognition   Skilled Treatment SLP assisted pt as he checked his homework, 66% correct; pt unaware of 70% of errors and req'd SLP mod verbal cues to incr awareness. Attention for verbal sequencing with picture support was targeted by SLP  and pt benefitted most from consistent mod verbal and visual cues from SLP for sequencing 5 steps correctly. In all tasks today, impulsivity noted by pt and SLP con't to bring to pt's attention. In the last 4 minutes of therapy pt notably slowed pace and had less frequent errors in sequencing.   Assessment / Recommendations / Plan   Plan Continue with current plan of care   Progression Toward Goals   Progression toward goals Progressing toward goals          SLP Education - 08/22/14 1540    Education provided Yes   Education Details impulsivity   Person(s) Educated Patient   Methods Explanation;Verbal cues   Comprehension Verbal cues required;Need further instruction          SLP Short Term Goals - 08/22/14 1407    SLP SHORT TERM GOAL #1   Title pt will attend to 10 minute cognitive communication task in a mod distracting environment with rare min A back to task   Time 3   Period Weeks   Status On-going   SLP SHORT TERM GOAL #2   Title pt will complete simple verbal time, money, and math problems 75% success and rare min A   Time 3   Period Weeks   Status On-going   SLP SHORT TERM GOAL #3   Title pt will demonstrate intellectual awareness to cognitive communicaiton deficits by telling SLP 3 non-physical deficits   Time  3   Period Weeks   Status On-going          SLP Long Term Goals - 08/22/14 1407    SLP LONG TERM GOAL #1   Title pt will demo alternating attention in at least two cognitive communication tasks by having 85% success on each task with rare min A   Time 7   Period Weeks   Status On-going   SLP LONG TERM GOAL #2   Title pt will demonstrate emergent awareness in mod complex cognitive linguistic tasks by correcting errors 90% of opportunities   Time 7   Period Weeks   Status On-going   SLP LONG TERM GOAL #3   Title pt will give appropriate solutions verbally to functional problems 95% of the time   Time 7   Period Weeks   Status On-going   SLP LONG  TERM GOAL #4   Title pt to use some kind of memory compensation system twice during a therapy session   Time 7   Period Weeks   Status On-going          Plan - 08/22/14 1540    Clinical Impression Statement Cont'd deficits in attention/impulsivity and awareness. Pt remarked, "It's a good thing my wife hid my keys!"   Speech Therapy Frequency 2x / week   Duration --  7 weeks (or 14 vistis)   Treatment/Interventions Compensatory techniques;Functional tasks;Patient/family education;SLP instruction and feedback;Cognitive reorganization;Environmental controls;Internal/external aids   Potential to Achieve Goals Good   Potential Considerations Ability to learn/carryover information        Problem List Patient Active Problem List   Diagnosis Date Noted  . Left spastic hemiparesis 08/07/2014  . Left-sided neglect 08/07/2014  . Right middle cerebral artery stroke 08/01/2014  . Stroke with cerebral ischemia   . CVA (cerebral vascular accident) 07/30/2014  . Tobacco dependence   . Carotid artery occlusion with infarction   . ETOH abuse   . Acute thrombotic stroke 07/25/2014  . CVA (cerebral infarction) 07/25/2014  . Acute ischemic stroke   . Essential hypertension   . Hyperlipidemia   . Tobacco abuse   . Essential hypertension, benign 10/11/2013  . Mixed hyperlipidemia 10/11/2013    [facility] , MS, CCC-SLP  08/22/2014, 3:43 PM  [facility] [address] [place], [place], [address] Phone: [phone]   Fax:  [phone]    .cs

## 2014-08-22 NOTE — Therapy (Signed)
[facility] [address] [place], [place], [address] Phone: [phone]   Fax:  [phone]  Occupational Therapy Treatment  Patient Details  Name: [name] MRN: [id number] Date of Birth: October 15, 1955 Referring Provider:  Maurice MarchMcPherson, Barbara B, MD  Encounter Date: 08/22/2014      OT End of Session - 08/22/14 1540    Visit Number 3   Number of Visits 17   Date for OT Re-Evaluation 10/10/14   Authorization Type [employer]   OT Start Time 1450   OT Stop Time 1530   OT Time Calculation (min) 40 min   Activity Tolerance Patient tolerated treatment well   Behavior During Therapy [facility] for tasks assessed/performed      Past Medical History  Diagnosis Date  . Arthritis   . Hypertension   . Hyperlipidemia     Past Surgical History  Procedure Laterality Date  . Loop recorder implant  07/27/14    LOOP REVEAL LINQ [id number] - [id number]  . Tee without cardioversion N/A 07/27/2014    Procedure: TRANSESOPHAGEAL ECHOCARDIOGRAM (TEE);  Surgeon: Chrystie NoseKenneth C Hilty, MD;  [facility];  Service: Cardiovascular;  Laterality: N/A;  . Ep implantable device N/A 07/27/2014    Procedure: Loop Recorder Insertion;  Surgeon: Marinus MawGregg W [place], MD;  [facility];  Service: Cardiovascular;  Laterality: N/A;    There were no vitals filed for this visit.  Visit Diagnosis:  Hemiparesis affecting nondominant side as late effect of cerebrovascular accident  Sensation alteration, late effect of cerebrovascular disease  Impaired cognition  Inattention  Visual disturbance      Subjective Assessment - 08/22/14 1448    Subjective  Pt reports continued pins and needles in arm with showering   Pertinent History see snapshot   Patient Stated Goals return to gardening, independence with self care.   Currently in Pain? No/denies     Treatment:Therapist performed the  MOCHA: score18/30, Pt was unable to perform trailmaking, copy cube  and clock correctly.Pt demonstrates deficits in short term memory as well  Neuro re-ed: supine AA/ROM shoulder flexion/ chest press with bilateral UE's , min-mod facilitation/ v.c. Seated low level functional reaching to place checkers in connect 4 frame with LUE mod v.c./facilitation  and mod difficulty to attend to LUE.                          OT Short Term Goals - 08/16/14 1702    OT SHORT TERM GOAL #1   Title Patient will independently don socks   Time 4   Period Weeks   Status On-going   OT SHORT TERM GOAL #2   Title Patient will demonstrate sufficient pincer grasp to tighten shoelace with left hand   Time 4   Period Weeks   Status On-going   OT SHORT TERM GOAL #3   Title Patient will visually attend to activity presented in midline for 10 minutes in minimally distracting environment   Time 4   Period Weeks   Status On-going   OT SHORT TERM GOAL #4   Title Patient will demonstrate ability to reach left arm overhead (at least 140 degrees shoulder flexion) while upright to hit target and repeat 5 times in preparation for overhead reach / release or grasp   Status On-going   OT SHORT TERM GOAL #5   Title Patient will prepare a simple cold snack following 2 step directives in minimally distracting environment   Status  On-going           OT Long Term Goals - 08/16/14 1702    OT LONG TERM GOAL #1   Title Patient will dress himself with modified independence   Time 8   Period Weeks   Status On-going   OT LONG TERM GOAL #2   Title Patient will shower himself, including transfer into and out of tub with modified independence   Time 8   Period Weeks   Status On-going   OT LONG TERM GOAL #3   Title Patient will complete grooming tasks independently, using visula cues for thoroughness (eg - shaving using mirror for guidance)   Time 8   Period Weeks   Status On-going   OT LONG TERM GOAL #4   Title Patient will effectively use long handled gardeing tool  to hoe/rake garden in standing using bilateral upper extremities   Time 8   Period Weeks   Status On-going   OT LONG TERM GOAL #5   Title Patient will demonstrate safe use of knife for cutting food at a meal, or during meal preparation.  9This goal requires effective use of two hands in this task)   Time 8   Period Weeks   Status On-going               Plan - 08/22/14 1522    Clinical Impression Statement Pt is progressing slowly towards goals. He is limited by significant sensory deficits and left inattention.   Pt will benefit from skilled therapeutic intervention in order to improve on the following deficits (Retired) Decreased balance;Decreased cognition;Decreased coordination;Decreased endurance;Decreased knowledge of precautions;Decreased knowledge of use of DME;Decreased range of motion;Decreased safety awareness;Impaired perceived functional ability;Increased edema;Decreased strength;Impaired sensation;Impaired tone;Impaired UE functional use;Impaired vision/preception   Rehab Potential Good   OT Frequency 2x / week   OT Duration 8 weeks   OT Treatment/Interventions Self-care/ADL training;Electrical Stimulation;Therapeutic exercise;Neuromuscular education;DME and/or AE instruction;Manual Therapy;Functional Mobility Training;Splinting;Therapeutic exercises;Therapeutic activities;Cognitive remediation/compensation;Visual/perceptual remediation/compensation;Patient/family education;Balance training   Plan neuro re-ed LUE   Consulted and Agree with Plan of Care Patient        Problem List Patient Active Problem List   Diagnosis Date Noted  . Left spastic hemiparesis 08/07/2014  . Left-sided neglect 08/07/2014  . Right middle cerebral artery stroke 08/01/2014  . Stroke with cerebral ischemia   . CVA (cerebral vascular accident) 07/30/2014  . Tobacco dependence   . Carotid artery occlusion with infarction   . ETOH abuse   . Acute thrombotic stroke 07/25/2014  . CVA  (cerebral infarction) 07/25/2014  . Acute ischemic stroke   . Essential hypertension   . Hyperlipidemia   . Tobacco abuse   . Essential hypertension, benign 10/11/2013  . Mixed hyperlipidemia 10/11/2013    RINE,KATHRYN 08/22/2014, 5:00 PM [place] Breath, OTR/L [phone] Phone: [phone] 5:00 PM 08/22/2014 [facility] [address] [place], [place], [address] Phone: [phone]   Fax:  [phone]

## 2014-08-24 ENCOUNTER — Ambulatory Visit: Payer: BLUE CROSS/BLUE SHIELD | Admitting: Occupational Therapy

## 2014-08-24 ENCOUNTER — Ambulatory Visit: Payer: BLUE CROSS/BLUE SHIELD

## 2014-08-24 DIAGNOSIS — I69359 Hemiplegia and hemiparesis following cerebral infarction affecting unspecified side: Secondary | ICD-10-CM

## 2014-08-24 DIAGNOSIS — I69859 Hemiplegia and hemiparesis following other cerebrovascular disease affecting unspecified side: Secondary | ICD-10-CM | POA: Diagnosis not present

## 2014-08-24 DIAGNOSIS — R41840 Attention and concentration deficit: Secondary | ICD-10-CM

## 2014-08-24 DIAGNOSIS — R4189 Other symptoms and signs involving cognitive functions and awareness: Secondary | ICD-10-CM

## 2014-08-24 DIAGNOSIS — H539 Unspecified visual disturbance: Secondary | ICD-10-CM

## 2014-08-24 DIAGNOSIS — R209 Unspecified disturbances of skin sensation: Secondary | ICD-10-CM

## 2014-08-24 DIAGNOSIS — R41841 Cognitive communication deficit: Secondary | ICD-10-CM

## 2014-08-24 DIAGNOSIS — I69998 Other sequelae following unspecified cerebrovascular disease: Secondary | ICD-10-CM

## 2014-08-24 NOTE — Patient Instructions (Signed)
  Please complete the assigned speech therapy homework and return it to your next session.  

## 2014-08-24 NOTE — Therapy (Signed)
[facility] [address] [place], [place], [address] Phone: [phone]   Fax:  [phone]  Speech Language Pathology Treatment  Patient Details  Name: Governor SpeckingJerry Beckel MRN: [id number] Date of Birth: 12-22-1955 Referring Provider:  Maurice MarchMcPherson, Barbara B, MD  Encounter Date: 08/24/2014      End of Session - 08/24/14 1359    Visit Number 4   Number of Visits 16   Date for SLP Re-Evaluation 10/15/14   SLP Start Time 1317   SLP Stop Time  1400   SLP Time Calculation (min) 43 min   Activity Tolerance Patient tolerated treatment well      Past Medical History  Diagnosis Date  . Arthritis   . Hypertension   . Hyperlipidemia     Past Surgical History  Procedure Laterality Date  . Loop recorder implant  07/27/14    LOOP REVEAL LINQ [id number] - [id number]  . Tee without cardioversion N/A 07/27/2014    Procedure: TRANSESOPHAGEAL ECHOCARDIOGRAM (TEE);  Surgeon: Chrystie NoseKenneth C Hilty, MD;  [facility];  Service: Cardiovascular;  Laterality: N/A;  . Ep implantable device N/A 07/27/2014    Procedure: Loop Recorder Insertion;  Surgeon: Marinus MawGregg W Taylor, MD;  [facility];  Service: Cardiovascular;  Laterality: N/A;    There were no vitals filed for this visit.  Visit Diagnosis: Cognitive communication deficit      Subjective Assessment - 08/24/14 1338    Subjective "Do I have anything here tomorrow (Saturday)?"               ADULT SLP TREATMENT - 08/24/14 1318    General Information   Behavior/Cognition Cooperative;Pleasant mood   Treatment Provided   Treatment provided Cognitive-Linquistic   Pain Assessment   Pain Assessment No/denies pain   Cognitive-Linquistic Treatment   Treatment focused on Cognition   Skilled Treatment "Corrected" homework did not have anything corrected on it. SLP assisted pt with corrections with mod A consistently to reason through erroneous answers. Total A to ID errors  (emergent awareness). Selective attention in simple tasks with rare min A. In written directions (simple) pt req'd mod verbal cues for attention, rarely, and total A for error awareness    Assessment / Recommendations / Plan   Plan Continue with current plan of care   Progression Toward Goals   Progression toward goals Progressing toward goals          SLP Education - 08/24/14 1359    Education provided Yes   Education Details areas of deficits   Person(s) Educated Patient   Methods Explanation   Comprehension Verbalized understanding;Need further instruction          SLP Short Term Goals - 08/24/14 1401    SLP SHORT TERM GOAL #1   Title pt will attend to 10 minute cognitive communication task in a mod distracting environment with rare min A back to task   Time 3   Period Weeks   Status On-going   SLP SHORT TERM GOAL #2   Title pt will complete simple verbal time, money, and math problems 75% success and rare min A   Time 3   Period Weeks   Status On-going   SLP SHORT TERM GOAL #3   Title pt will demonstrate intellectual awareness to cognitive communicaiton deficits by telling SLP 3 non-physical deficits   Time 3   Period Weeks   Status On-going          SLP Long Term Goals -  08/24/14 1401    SLP LONG TERM GOAL #1   Title pt will demo alternating attention in at least two cognitive communication tasks by having 85% success on each task with rare min A   Time 7   Period Weeks   Status On-going   SLP LONG TERM GOAL #2   Title pt will demonstrate emergent awareness in mod complex cognitive linguistic tasks by correcting errors 90% of opportunities   Time 7   Period Weeks   Status On-going   SLP LONG TERM GOAL #3   Title pt will give appropriate solutions verbally to functional problems 95% of the time   Time 7   Period Weeks   Status On-going   SLP LONG TERM GOAL #4   Title pt to use some kind of memory compensation system twice during a therapy session   Time  7   Period Weeks   Status On-going          Plan - 08/24/14 1400    Clinical Impression Statement Cont'd deficits in attention/impulsivity and awareness. Skilled ST needs to continue to maximize cognitive-linguistic skills for decr'd caregiver burden.   Speech Therapy Frequency 2x / week   Duration --  7 weeks (or 13 visits)   Treatment/Interventions Compensatory techniques;Functional tasks;Patient/family education;SLP instruction and feedback;Cognitive reorganization;Environmental controls;Internal/external aids   Potential to Achieve Goals Good   Potential Considerations Ability to learn/carryover information        Problem List Patient Active Problem List   Diagnosis Date Noted  . Left spastic hemiparesis 08/07/2014  . Left-sided neglect 08/07/2014  . Right middle cerebral artery stroke 08/01/2014  . Stroke with cerebral ischemia   . CVA (cerebral vascular accident) 07/30/2014  . Tobacco dependence   . Carotid artery occlusion with infarction   . ETOH abuse   . Acute thrombotic stroke 07/25/2014  . CVA (cerebral infarction) 07/25/2014  . Acute ischemic stroke   . Essential hypertension   . Hyperlipidemia   . Tobacco abuse   . Essential hypertension, benign 10/11/2013  . Mixed hyperlipidemia 10/11/2013    [facility] , MS, CCC-SLP  08/24/2014, 2:01 PM  [facility] [address] [place], [place], [address] Phone: [phone]   Fax:  [phone]

## 2014-08-24 NOTE — Patient Instructions (Addendum)
Wear paddle splint for 30 mins 1-2x day after strecthing fingers over knee.  Sit on edge of bed and place left hand beside you. Hold for 5-10 secs.5-10 reps Perform table slides with both hands on a towel, (seated if alone, or standing with assistance) 10-20 reps 1-2x day.  If problems with splint STOP wearing! Don't sleep in splint! Bring to next OT session!

## 2014-08-24 NOTE — Therapy (Signed)
[facility] [address] [place], [place], [address] Phone: [phone]   Fax:  [phone]  Occupational Therapy Treatment  Patient Details  Name: [name] MRN: [id number] Date of Birth: 1955-11-08 Referring Provider:  Maurice March, MD  Encounter Date: 08/24/2014    Past Medical History  Diagnosis Date  . Arthritis   . Hypertension   . Hyperlipidemia     Past Surgical History  Procedure Laterality Date  . Loop recorder implant  07/27/14    LOOP REVEAL LINQ [id number] - [id number]  . Tee without cardioversion N/A 07/27/2014    Procedure: TRANSESOPHAGEAL ECHOCARDIOGRAM (TEE);  Surgeon: Chrystie Nose, MD;  Location: [facility] ENDOSCOPY;  Service: Cardiovascular;  Laterality: N/A;  . Ep implantable device N/A 07/27/2014    Procedure: Loop Recorder Insertion;  Surgeon: Marinus Maw, MD;  Location: [facility] INVASIVE CV LAB;  Service: Cardiovascular;  Laterality: N/A;    There were no vitals filed for this visit.  Visit Diagnosis:  Hemiparesis affecting nondominant side as late effect of cerebrovascular accident  Sensation alteration, late effect of cerebrovascular disease  Impaired cognition  Inattention  Visual disturbance      Subjective Assessment - 08/24/14 1408    Pertinent History see snapshot   Patient Stated Goals return to gardening, independence with self care.      Treatment: Neuro re-ed: Finger flexion extension, AA/ROM, shoulder flexion seated at table LUe on towel for shoulder flexion, circumduction, min v.c. to attend to LUE. Functional reaching in standing to grasp cylindrical objects and place on surface at grossly 60 shoulder flexion, mod v.c./facilitation. Functional activities with LUE,flipping playing cards and picking up medicine bottles. Closed chain shoulder flexion with medium ball and bilateral UE's, min-mod v.c. To attend to LUE. Therapist spoke with pt's wife and encouraged her attend next  therapy session for HEP education. Therapist recommended that pt's wife have pt incorporate LUE into light functional activities at home. (Nothing breakable, sharp or hot). Therapist recommended pt has supervision when he's out in the yard at home as he has decreased left side awarenes and increased fall risk.                           OT Short Term Goals - 08/16/14 1702    OT SHORT TERM GOAL #1   Title Patient will independently don socks   Time 4   Period Weeks   Status On-going   OT SHORT TERM GOAL #2   Title Patient will demonstrate sufficient pincer grasp to tighten shoelace with left hand   Time 4   Period Weeks   Status On-going   OT SHORT TERM GOAL #3   Title Patient will visually attend to activity presented in midline for 10 minutes in minimally distracting environment   Time 4   Period Weeks   Status On-going   OT SHORT TERM GOAL #4   Title Patient will demonstrate ability to reach left arm overhead (at least 140 degrees shoulder flexion) while upright to hit target and repeat 5 times in preparation for overhead reach / release or grasp   Status On-going   OT SHORT TERM GOAL #5   Title Patient will prepare a simple cold snack following 2 step directives in minimally distracting environment   Status On-going           OT Long Term Goals - 08/16/14 1702    OT LONG TERM GOAL #1  Title Patient will dress himself with modified independence   Time 8   Period Weeks   Status On-going   OT LONG TERM GOAL #2   Title Patient will shower himself, including transfer into and out of tub with modified independence   Time 8   Period Weeks   Status On-going   OT LONG TERM GOAL #3   Title Patient will complete grooming tasks independently, using visula cues for thoroughness (eg - shaving using mirror for guidance)   Time 8   Period Weeks   Status On-going   OT LONG TERM GOAL #4   Title Patient will effectively use long handled gardeing tool to hoe/rake  garden in standing using bilateral upper extremities   Time 8   Period Weeks   Status On-going   OT LONG TERM GOAL #5   Title Patient will demonstrate safe use of knife for cutting food at a meal, or during meal preparation.  9This goal requires effective use of two hands in this task)   Time 8   Period Weeks   Status On-going               Plan - 08/24/14 1413    Clinical Impression Statement Pt is progressing towards goals limited by sensory deficits and left inattention.Pt demonstrates ability to use LUE for simple tasks if he watches LUE while he uses it.   Pt will benefit from skilled therapeutic intervention in order to improve on the following deficits (Retired) Decreased balance;Decreased cognition;Decreased coordination;Decreased endurance;Decreased knowledge of precautions;Decreased knowledge of use of DME;Decreased range of motion;Decreased safety awareness;Impaired perceived functional ability;Increased edema;Decreased strength;Impaired sensation;Impaired tone;Impaired UE functional use;Impaired vision/preception   Rehab Potential Good   OT Frequency 2x / week   OT Duration 8 weeks   OT Treatment/Interventions Self-care/ADL training;Electrical Stimulation;Therapeutic exercise;Neuromuscular education;DME and/or AE instruction;Manual Therapy;Functional Mobility Training;Splinting;Therapeutic exercises;Therapeutic activities;Cognitive remediation/compensation;Visual/perceptual remediation/compensation;Patient/family education;Balance training   Plan neuro re-ed functional use LUE   Consulted and Agree with Plan of Care Patient        Problem List Patient Active Problem List   Diagnosis Date Noted  . Left spastic hemiparesis 08/07/2014  . Left-sided neglect 08/07/2014  . Right middle cerebral artery stroke 08/01/2014  . Stroke with cerebral ischemia   . CVA (cerebral vascular accident) 07/30/2014  . Tobacco dependence   . Carotid artery occlusion with infarction   .  ETOH abuse   . Acute thrombotic stroke 07/25/2014  . CVA (cerebral infarction) 07/25/2014  . Acute ischemic stroke   . Essential hypertension   . Hyperlipidemia   . Tobacco abuse   . Essential hypertension, benign 10/11/2013  . Mixed hyperlipidemia 10/11/2013    Katarina Riebe 08/24/2014, 2:39 PM Keene BreathKathryn Aniruddh Ciavarella, OTR/L [phone] Phone: [phone] 2:39 PM 08/24/2014 [facility] [address] [place], [place], [address] Phone: [phone]   Fax:  [phone]

## 2014-08-27 ENCOUNTER — Ambulatory Visit (INDEPENDENT_AMBULATORY_CARE_PROVIDER_SITE_OTHER): Payer: BLUE CROSS/BLUE SHIELD | Admitting: *Deleted

## 2014-08-27 ENCOUNTER — Encounter: Payer: Self-pay | Admitting: Internal Medicine

## 2014-08-27 DIAGNOSIS — I639 Cerebral infarction, unspecified: Secondary | ICD-10-CM

## 2014-08-28 NOTE — Progress Notes (Signed)
Loop recorder 

## 2014-08-29 ENCOUNTER — Other Ambulatory Visit: Payer: Self-pay | Admitting: Family Medicine

## 2014-08-29 ENCOUNTER — Ambulatory Visit: Payer: BLUE CROSS/BLUE SHIELD | Admitting: Physical Therapy

## 2014-08-29 ENCOUNTER — Telehealth: Payer: Self-pay | Admitting: *Deleted

## 2014-08-29 DIAGNOSIS — R29898 Other symptoms and signs involving the musculoskeletal system: Secondary | ICD-10-CM

## 2014-08-29 DIAGNOSIS — I69859 Hemiplegia and hemiparesis following other cerebrovascular disease affecting unspecified side: Secondary | ICD-10-CM | POA: Diagnosis not present

## 2014-08-29 DIAGNOSIS — R269 Unspecified abnormalities of gait and mobility: Secondary | ICD-10-CM

## 2014-08-29 NOTE — Telephone Encounter (Signed)
LMOM for return call. AF detected on LINQ--reviewed by GT. GT referred to [employer]mber S. Appointment scheduled for 09-03-14 @ 1130.

## 2014-08-30 ENCOUNTER — Encounter: Payer: Self-pay | Admitting: Physical Therapy

## 2014-08-30 ENCOUNTER — Ambulatory Visit: Payer: BLUE CROSS/BLUE SHIELD | Admitting: Occupational Therapy

## 2014-08-30 ENCOUNTER — Ambulatory Visit: Payer: BLUE CROSS/BLUE SHIELD | Admitting: Physical Therapy

## 2014-08-30 DIAGNOSIS — I69859 Hemiplegia and hemiparesis following other cerebrovascular disease affecting unspecified side: Secondary | ICD-10-CM | POA: Diagnosis not present

## 2014-08-30 DIAGNOSIS — I69359 Hemiplegia and hemiparesis following cerebral infarction affecting unspecified side: Secondary | ICD-10-CM

## 2014-08-30 DIAGNOSIS — R209 Unspecified disturbances of skin sensation: Secondary | ICD-10-CM

## 2014-08-30 DIAGNOSIS — I69998 Other sequelae following unspecified cerebrovascular disease: Secondary | ICD-10-CM

## 2014-08-30 DIAGNOSIS — R4189 Other symptoms and signs involving cognitive functions and awareness: Secondary | ICD-10-CM

## 2014-08-30 DIAGNOSIS — R41840 Attention and concentration deficit: Secondary | ICD-10-CM

## 2014-08-30 DIAGNOSIS — H539 Unspecified visual disturbance: Secondary | ICD-10-CM

## 2014-08-30 NOTE — Therapy (Signed)
[facility] [address] [place], [place], [address] Phone: [phone]   Fax:  [phone]  Occupational Therapy Treatment  Patient Details  Name: [name] MRN: [id number] Date of Birth: 12-Jul-1955 Referring Provider:  Maurice MarchMcPherson, Barbara B, MD  Encounter Date: 08/30/2014      OT End of Session - 08/30/14 1642    Visit Number 5   Number of Visits 17   Authorization - Visit Number 5   Authorization - Number of Visits 30   OT Start Time 0855   OT Stop Time 0936   OT Time Calculation (min) 41 min   Activity Tolerance Patient tolerated treatment well      Past Medical History  Diagnosis Date  . Arthritis   . Hypertension   . Hyperlipidemia     Past Surgical History  Procedure Laterality Date  . Loop recorder implant  07/27/14    LOOP REVEAL LINQ [id number] - [id number]  . Tee without cardioversion N/A 07/27/2014    Procedure: TRANSESOPHAGEAL ECHOCARDIOGRAM (TEE);  Surgeon: Chrystie NoseKenneth C Hilty, MD;  [facility];  Service: Cardiovascular;  Laterality: N/A;  . Ep implantable device N/A 07/27/2014    Procedure: Loop Recorder Insertion;  Surgeon: Marinus MawGregg W Taylor, MD;  [facility];  Service: Cardiovascular;  Laterality: N/A;    There were no vitals filed for this visit.  Visit Diagnosis:  Hemiparesis affecting nondominant side as late effect of cerebrovascular accident  Sensation alteration, late effect of cerebrovascular disease  Impaired cognition  Inattention  Visual disturbance      Subjective Assessment - 08/30/14 0855    Currently in Pain? No/denies       Treatment: Neuro muscular re-ed: Supine AA/ROM shoulder flexion with PVC pipe frame, bilateral UE's, with min facilitation and v.c. to attend to LUE, 2 sets 10 reps. Seated shoulder flexion, bilateral with medium ball  to mid range, min facilitation, mod v.c. to attend to LUE and for straightening elbow. Quadraped over ball,  with therapist  assisting with LUE positioning, followed by tall kneeling rolling ball forwards for AA/ROM shoulder flexion bilaterally, min-mod facilitation. Functional grasp release of wooden dowel pegs with LUE, standing at table, mod facilitation/ assistance, with v.c. to attend to left hand. [name] was unable to participate today as she has her 3 grandchildren with her, she plans to observe therapy tomorrow.                         OT Short Term Goals - 08/16/14 1702    OT SHORT TERM GOAL #1   Title Patient will independently don socks   Time 4   Period Weeks   Status On-going   OT SHORT TERM GOAL #2   Title Patient will demonstrate sufficient pincer grasp to tighten shoelace with left hand   Time 4   Period Weeks   Status On-going   OT SHORT TERM GOAL #3   Title Patient will visually attend to activity presented in midline for 10 minutes in minimally distracting environment   Time 4   Period Weeks   Status On-going   OT SHORT TERM GOAL #4   Title Patient will demonstrate ability to reach left arm overhead (at least 140 degrees shoulder flexion) while upright to hit target and repeat 5 times in preparation for overhead reach / release or grasp   Status On-going   OT SHORT TERM GOAL #5   Title Patient will prepare  a simple cold snack following 2 step directives in minimally distracting environment   Status On-going           OT Long Term Goals - 08/16/14 1702    OT LONG TERM GOAL #1   Title Patient will dress himself with modified independence   Time 8   Period Weeks   Status On-going   OT LONG TERM GOAL #2   Title Patient will shower himself, including transfer into and out of tub with modified independence   Time 8   Period Weeks   Status On-going   OT LONG TERM GOAL #3   Title Patient will complete grooming tasks independently, using visula cues for thoroughness (eg - shaving using mirror for guidance)   Time 8   Period Weeks   Status  On-going   OT LONG TERM GOAL #4   Title Patient will effectively use long handled gardeing tool to hoe/rake garden in standing using bilateral upper extremities   Time 8   Period Weeks   Status On-going   OT LONG TERM GOAL #5   Title Patient will demonstrate safe use of knife for cutting food at a meal, or during meal preparation.  9This goal requires effective use of two hands in this task)   Time 8   Period Weeks   Status On-going               Plan - 08/30/14 1643    Clinical Impression Statement Pt demonstrates improving ability to use LUE when cued to atttend to LUE/ and watch it. Pt still demonstrates significant sensory deficitis in LUE.   Pt will benefit from skilled therapeutic intervention in order to improve on the following deficits (Retired) Decreased balance;Decreased cognition;Decreased coordination;Decreased endurance;Decreased knowledge of precautions;Decreased knowledge of use of DME;Decreased range of motion;Decreased safety awareness;Impaired perceived functional ability;Increased edema;Decreased strength;Impaired sensation;Impaired tone;Impaired UE functional use;Impaired vision/preception   Rehab Potential Good   OT Frequency 2x / week   OT Duration 8 weeks   OT Treatment/Interventions Self-care/ADL training;Electrical Stimulation;Therapeutic exercise;Neuromuscular education;DME and/or AE instruction;Manual Therapy;Functional Mobility Training;Splinting;Therapeutic exercises;Therapeutic activities;Cognitive remediation/compensation;Visual/perceptual remediation/compensation;Patient/family education;Balance training   Plan HEP education with pt's wife, neuro re-ed   Consulted and Agree with Plan of Care Patient        Problem List Patient Active Problem List   Diagnosis Date Noted  . Left spastic hemiparesis 08/07/2014  . Left-sided neglect 08/07/2014  . Right middle cerebral artery stroke 08/01/2014  . Stroke with cerebral ischemia   . CVA (cerebral  vascular accident) 07/30/2014  . Tobacco dependence   . Carotid artery occlusion with infarction   . ETOH abuse   . Acute thrombotic stroke 07/25/2014  . CVA (cerebral infarction) 07/25/2014  . Acute ischemic stroke   . Essential hypertension   . Hyperlipidemia   . Tobacco abuse   . Essential hypertension, benign 10/11/2013  . Mixed hyperlipidemia 10/11/2013    RINE,KATHRYN 08/30/2014, 4:53 PM Keene Breath, OTR/L [phone] Phone: [phone] 4:53 PM 08/30/2014 [facility] [address] [place], [place], [address] Phone: [phone]   Fax:  [phone]

## 2014-08-30 NOTE — Therapy (Signed)
[facility] [address] [place], [place], [address] Phone: [phone]   Fax:  [phone]  Physical Therapy Treatment  Patient Details  Name: [name] MRN: [id number] Date of Birth: 06-06-1955 Referring Provider:  Maurice March, MD  Encounter Date: 08/29/2014      PT End of Session - 08/30/14 0704    Visit Number 3   Number of Visits 7   Date for PT Re-Evaluation 09/12/14   Authorization Type BCBS   Authorization - Visit Number 3   Authorization - Number of Visits 30   PT Start Time [phone]   PT Stop Time 1020   PT Time Calculation (min) 44 min      Past Medical History  Diagnosis Date  . Arthritis   . Hypertension   . Hyperlipidemia     Past Surgical History  Procedure Laterality Date  . Loop recorder implant  07/27/14    LOOP REVEAL LINQ [id number] - [id number]  . Tee without cardioversion N/A 07/27/2014    Procedure: TRANSESOPHAGEAL ECHOCARDIOGRAM (TEE);  Surgeon: Chrystie Nose, MD;  Location: [facility] ENDOSCOPY;  Service: Cardiovascular;  Laterality: N/A;  . Ep implantable device N/A 07/27/2014    Procedure: Loop Recorder Insertion;  Surgeon: Marinus Maw, MD;  Location: [facility] INVASIVE CV LAB;  Service: Cardiovascular;  Laterality: N/A;    There were no vitals filed for this visit.  Visit Diagnosis:  Left leg weakness  Abnormality of gait      Subjective Assessment - 08/30/14 0656    Subjective Pt. states he is doing better with walking and mobility; reports he went with his son to practice hitting golf balls over the weekend   Pertinent History h/o CVA x 2; HTN; h/o ETOH abuse   Patient Stated Goals Improve balance and gait    Currently in Pain? No/denies                         [facility] Adult PT Treatment/Exercise - 08/30/14 0001    Knee/Hip Exercises: Aerobic   Stationary Bike SciFit level 1.5 x 6" with LE's only   Knee/Hip Exercises: Machines for Strengthening   Cybex Leg  Press bil. LE's 50# 10 reps; LLE only 35# 10 reps   Knee/Hip Exercises: Standing   Heel Raises Left;1 set;10 reps   Heel Raises Limitations weakness   Knee Flexion AROM;Left;1 set;15 reps  blue theraband - seated   Other Standing Knee Exercises amb. on tip toes with knees flexed 35' x 2 reps forward and backward    TherEx; L heel raises off hi/lo mat table  For closed chain L plantarflexor strengthening 2 sets 10 reps;  seated L dorsiflexion with blue theraband - 5 sec hold with cues to slowly lower for eccentric strengthening seated L hamstring strengthening with blue theraband - with cues to dorsiflex L foot - 5 sec hold x 15 reps Standing L hip ext., abduction, and flexion with green theraband x 10 reps each  Gait; step negotiation - without rail for balance training with S with step over step sequence 250' without device with cues to prevent L foot slap  In stance; cues for incr. L push off             PT Short Term Goals - 08/14/14 1030    PT SHORT TERM GOAL #1   Title same as LTG's           PT Long Term Goals -  08/14/14 1030    PT LONG TERM GOAL #1   Title Pt. will amb. 1000' on all surfaces independently without LOB.   Time 3   Period Weeks   Status New   PT LONG TERM GOAL #2   Title Negotiate 4 steps without use of rail using a step over step sequence to demo improved balance   Time 3   Period Weeks   Status New   PT LONG TERM GOAL #3   Title Complete Functional gait assessment and establish goal as appropriate   Time 3   Period Weeks   Status New   PT LONG TERM GOAL #4   Title Independent in HEP for L hamstring and plantarflexor strengthening   Time 3   Period Weeks   Status New               Plan - 08/30/14 0705    Clinical Impression Statement Pt.'s balance and gait are improving - pt able to control L foot in stance with concentration to lower rather than letting foot slap down - pt has decr. eccentric dorsiflexion strength and cont ot  have weakness in L plantarflexors    Pt will benefit from skilled therapeutic intervention in order to improve on the following deficits Abnormal gait;Decreased coordination;Decreased balance;Impaired sensation;Decreased mobility;Decreased cognition;Decreased strength;Decreased activity tolerance   Rehab Potential Good   PT Frequency 2x / week   PT Duration 3 weeks   PT Treatment/Interventions ADLs/Self Care Home Management;Functional mobility training;Stair training;Gait training;Therapeutic activities;Therapeutic exercise;Balance training;Neuromuscular re-education;Patient/family education   PT Next Visit Plan cont with LLE strengthening - plantarflexor and eccentric dorsiflexor strengthening; high level balance training   PT Home Exercise Plan theraband exercise for L hamstring and dorsiflexor strengthening; heel raises        Problem List Patient Active Problem List   Diagnosis Date Noted  . Left spastic hemiparesis 08/07/2014  . Left-sided neglect 08/07/2014  . Right middle cerebral artery stroke 08/01/2014  . Stroke with cerebral ischemia   . CVA (cerebral vascular accident) 07/30/2014  . Tobacco dependence   . Carotid artery occlusion with infarction   . ETOH abuse   . Acute thrombotic stroke 07/25/2014  . CVA (cerebral infarction) 07/25/2014  . Acute ischemic stroke   . Essential hypertension   . Hyperlipidemia   . Tobacco abuse   . Essential hypertension, benign 10/11/2013  . Mixed hyperlipidemia 10/11/2013    Kary Kosilday, Bastian Andreoli Suzanne, PT 08/30/2014, 7:10 AM  [facility] [address] [place], [place], [address] Phone: [phone]   Fax:  [phone]

## 2014-08-31 ENCOUNTER — Ambulatory Visit: Payer: BLUE CROSS/BLUE SHIELD

## 2014-08-31 ENCOUNTER — Ambulatory Visit: Payer: BLUE CROSS/BLUE SHIELD | Admitting: Occupational Therapy

## 2014-08-31 DIAGNOSIS — I69998 Other sequelae following unspecified cerebrovascular disease: Secondary | ICD-10-CM

## 2014-08-31 DIAGNOSIS — R4189 Other symptoms and signs involving cognitive functions and awareness: Secondary | ICD-10-CM

## 2014-08-31 DIAGNOSIS — R41841 Cognitive communication deficit: Secondary | ICD-10-CM

## 2014-08-31 DIAGNOSIS — H539 Unspecified visual disturbance: Secondary | ICD-10-CM

## 2014-08-31 DIAGNOSIS — I69859 Hemiplegia and hemiparesis following other cerebrovascular disease affecting unspecified side: Secondary | ICD-10-CM | POA: Diagnosis not present

## 2014-08-31 DIAGNOSIS — R41840 Attention and concentration deficit: Secondary | ICD-10-CM

## 2014-08-31 DIAGNOSIS — R209 Unspecified disturbances of skin sensation: Principal | ICD-10-CM

## 2014-08-31 NOTE — Therapy (Signed)
[facility] [address] [place], [place], [address] Phone: [phone]   Fax:  [phone]  Speech Language Pathology Treatment  Patient Details  Name: [name] MRN: [id number] Date of Birth: 01-24-1956 Referring Provider:  Maurice March, MD  Encounter Date: 08/31/2014      End of Session - 08/31/14 1627    Visit Number 5   Number of Visits 16   Date for SLP Re-Evaluation 10/15/14   Authorization - Visit Number 4   Authorization - Number of Visits 30   SLP Start Time 1533   SLP Stop Time  1615   SLP Time Calculation (min) 42 min   Activity Tolerance Patient tolerated treatment well      Past Medical History  Diagnosis Date  . Arthritis   . Hypertension   . Hyperlipidemia     Past Surgical History  Procedure Laterality Date  . Loop recorder implant  07/27/14    LOOP REVEAL LINQ [id number] - [id number]  . Tee without cardioversion N/A 07/27/2014    Procedure: TRANSESOPHAGEAL ECHOCARDIOGRAM (TEE);  Surgeon: Chrystie Nose, MD;  Location: [facility] ENDOSCOPY;  Service: Cardiovascular;  Laterality: N/A;  . Ep implantable device N/A 07/27/2014    Procedure: Loop Recorder Insertion;  Surgeon: Marinus Maw, MD;  Location: [facility] INVASIVE CV LAB;  Service: Cardiovascular;  Laterality: N/A;    There were no vitals filed for this visit.  Visit Diagnosis: Cognitive communication deficit      Subjective Assessment - 08/31/14 1536    Subjective Wife joined pt today in therapy.               ADULT SLP TREATMENT - 08/31/14 1624    General Information   Behavior/Cognition Cooperative;Pleasant mood   Treatment Provided   Treatment provided Cognitive-Linquistic   Pain Assessment   Pain Assessment No/denies pain   Cognitive-Linquistic Treatment   Treatment focused on Cognition   Skilled Treatment Pt was engaged by SLP in attention tasks today focusing on selective and alternating attention with filling out  forms. Pt req'd mod to max cues for alternating attention between diagram and question, and total A with emergent awareness. Pt showed anticipatory awareness by telling SLP he could not mow lawn currently due to lt-sided weakness.   Assessment / Recommendations / Plan   Plan Continue with current plan of care   Progression Toward Goals   Progression toward goals Progressing toward goals          SLP Education - 08/31/14 1627    Education provided Yes   Education Details deficit areas   Person(s) Educated Patient;Spouse   Methods Explanation   Comprehension Verbalized understanding;Need further instruction          SLP Short Term Goals - 08/31/14 1628    SLP SHORT TERM GOAL #1   Title pt will attend to 10 minute cognitive communication task in a mod distracting environment with rare min A back to task   Time 2   Period Weeks   Status On-going   SLP SHORT TERM GOAL #2   Title pt will complete simple verbal time, money, and math problems 75% success and rare min A   Time 2   Period Weeks   Status On-going   SLP SHORT TERM GOAL #3   Title pt will demonstrate intellectual awareness to cognitive communicaiton deficits by telling SLP 3 non-physical deficits   Time 2   Period Weeks   Status On-going  SLP Long Term Goals - 08/31/14 1629    SLP LONG TERM GOAL #1   Title pt will demo alternating attention in at least two cognitive communication tasks by having 85% success on each task with rare min A   Time 6   Period Weeks   Status On-going   SLP LONG TERM GOAL #2   Title pt will demonstrate emergent awareness in mod complex cognitive linguistic tasks by correcting errors 90% of opportunities   Time 6   Period Weeks   Status On-going   SLP LONG TERM GOAL #3   Title pt will give appropriate solutions verbally to functional problems 95% of the time   Time 6   Period Weeks   Status On-going   SLP LONG TERM GOAL #4   Title pt to use some kind of memory  compensation system twice during a therapy session   Time 6   Period Weeks   Status On-going          Plan - 08/31/14 1628    Clinical Impression Statement Cont'd deficits in attention/impulsivity and awareness. Skilled ST needs to continue to maximize cognitive-linguistic skills for decr'd caregiver burden.   Speech Therapy Frequency 2x / week   Duration --  6 weeks (or 12 more visits)   Treatment/Interventions Compensatory techniques;Functional tasks;Patient/family education;SLP instruction and feedback;Cognitive reorganization;Environmental controls;Internal/external aids   Potential to Achieve Goals Good   Potential Considerations Ability to learn/carryover information        Problem List Patient Active Problem List   Diagnosis Date Noted  . Left spastic hemiparesis 08/07/2014  . Left-sided neglect 08/07/2014  . Right middle cerebral artery stroke 08/01/2014  . Stroke with cerebral ischemia   . CVA (cerebral vascular accident) 07/30/2014  . Tobacco dependence   . Carotid artery occlusion with infarction   . ETOH abuse   . Acute thrombotic stroke 07/25/2014  . CVA (cerebral infarction) 07/25/2014  . Acute ischemic stroke   . Essential hypertension   . Hyperlipidemia   . Tobacco abuse   . Essential hypertension, benign 10/11/2013  . Mixed hyperlipidemia 10/11/2013    [facility] HealthcareCHINKE,CARL , MS, CCC-SLP  08/31/2014, 4:29 PM  [facility] [address] [place], [place], [address] Phone: [phone]   Fax:  [phone]

## 2014-08-31 NOTE — Therapy (Signed)
[facility] [address] [place], [place], [address] Phone: [phone]   Fax:  [phone]  Occupational Therapy Treatment  Patient Details  Name: [name] MRN: [id number] Date of Birth: Jan 17, 1956 Referring Provider:  Maurice March, MD  Encounter Date: 08/31/2014      OT End of Session - 08/31/14 1611    Visit Number 6   Number of Visits 17   Date for OT Re-Evaluation 10/10/14   Authorization Type BCBS   Authorization - Visit Number 6   Authorization - Number of Visits 30   OT Start Time 1445   OT Stop Time 1530   OT Time Calculation (min) 45 min   Activity Tolerance Patient tolerated treatment well   Behavior During Therapy [facility] for tasks assessed/performed      Past Medical History  Diagnosis Date  . Arthritis   . Hypertension   . Hyperlipidemia     Past Surgical History  Procedure Laterality Date  . Loop recorder implant  07/27/14    LOOP REVEAL LINQ [id number] - [id number]  . Tee without cardioversion N/A 07/27/2014    Procedure: TRANSESOPHAGEAL ECHOCARDIOGRAM (TEE);  Surgeon: Chrystie Nose, MD;  Location: [facility] ENDOSCOPY;  Service: Cardiovascular;  Laterality: N/A;  . Ep implantable device N/A 07/27/2014    Procedure: Loop Recorder Insertion;  Surgeon: Marinus Maw, MD;  Location: [facility] INVASIVE CV LAB;  Service: Cardiovascular;  Laterality: N/A;    There were no vitals filed for this visit.  Visit Diagnosis:  Sensation alteration, late effect of cerebrovascular disease  Impaired cognition  Inattention  Visual disturbance      Subjective Assessment - 08/31/14 1544    Subjective  Patient reports pins and needles feeling in left hnd and arm   Pertinent History see snapshot   Patient Stated Goals return to gardening, independence with self care.   Currently in Pain? No/denies   Pain Score 0-No pain                      OT Treatments/Exercises (OP) - 08/31/14 0001    Neurological Re-education Exercises   Other Exercises 1 Neuromuscular reeducation to retrain use of left upper extremity.  Patient with dense sensory loss, and left neglect, however with increased emphasis able to use left hand with set up assistance for both gross and fine motor tasks.  Patient had most difficulty sustaining muscle control once initiated - lacks the ability to maintain force production - due to cognitive and sensory impairments moreson than physical limitation.     Other Exercises 2 Sweeping the floor with both hands on broom, opening and closing door with handle, opening and closing. refrigerator door, carrying cold can of soda across the room to wife, etc.  Forced use of left upper extremity whereas environemnt and taks demand increaed force production for success.                 OT Education - 08/31/14 1610    Education provided Yes   Education Details wife present today, discussed forced use, the importance of using left hand to aide with daily life skills, importance of visual scanning to the left   Person(s) Educated Patient   Methods Explanation   Comprehension Verbalized understanding;Verbal cues required;Returned demonstration          OT Short Term Goals - 08/16/14 1702    OT SHORT TERM GOAL #1   Title Patient will independently don socks  Time 4   Period Weeks   Status On-going   OT SHORT TERM GOAL #2   Title Patient will demonstrate sufficient pincer grasp to tighten shoelace with left hand   Time 4   Period Weeks   Status On-going   OT SHORT TERM GOAL #3   Title Patient will visually attend to activity presented in midline for 10 minutes in minimally distracting environment   Time 4   Period Weeks   Status On-going   OT SHORT TERM GOAL #4   Title Patient will demonstrate ability to reach left arm overhead (at least 140 degrees shoulder flexion) while upright to hit target and repeat 5 times in preparation for overhead reach / release or grasp    Status On-going   OT SHORT TERM GOAL #5   Title Patient will prepare a simple cold snack following 2 step directives in minimally distracting environment   Status On-going           OT Long Term Goals - 08/16/14 1702    OT LONG TERM GOAL #1   Title Patient will dress himself with modified independence   Time 8   Period Weeks   Status On-going   OT LONG TERM GOAL #2   Title Patient will shower himself, including transfer into and out of tub with modified independence   Time 8   Period Weeks   Status On-going   OT LONG TERM GOAL #3   Title Patient will complete grooming tasks independently, using visula cues for thoroughness (eg - shaving using mirror for guidance)   Time 8   Period Weeks   Status On-going   OT LONG TERM GOAL #4   Title Patient will effectively use long handled gardeing tool to hoe/rake garden in standing using bilateral upper extremities   Time 8   Period Weeks   Status On-going   OT LONG TERM GOAL #5   Title Patient will demonstrate safe use of knife for cutting food at a meal, or during meal preparation.  9This goal requires effective use of two hands in this task)   Time 8   Period Weeks   Status On-going               Plan - 08/31/14 1612    Clinical Impression Statement Patient showing some improvement in his ability to use left hand functionally in grasp / release situations with mod cueing.  Patient still requiring assist with lower body dressing - socks and shoes.     Pt will benefit from skilled therapeutic intervention in order to improve on the following deficits (Retired) Decreased balance;Decreased cognition;Decreased coordination;Decreased endurance;Decreased knowledge of precautions;Decreased knowledge of use of DME;Decreased range of motion;Decreased safety awareness;Impaired perceived functional ability;Increased edema;Decreased strength;Impaired sensation;Impaired tone;Impaired UE functional use;Impaired vision/preception   Rehab  Potential Good   OT Frequency 2x / week   OT Duration 8 weeks   OT Treatment/Interventions Self-care/ADL training;Electrical Stimulation;Therapeutic exercise;Neuromuscular education;DME and/or AE instruction;Manual Therapy;Functional Mobility Training;Splinting;Therapeutic exercises;Therapeutic activities;Cognitive remediation/compensation;Visual/perceptual remediation/compensation;Patient/family education;Balance training   Plan Home activity program started with patient and wife   Consulted and Agree with Plan of Care Patient        Problem List Patient Active Problem List   Diagnosis Date Noted  . Left spastic hemiparesis 08/07/2014  . Left-sided neglect 08/07/2014  . Right middle cerebral artery stroke 08/01/2014  . Stroke with cerebral ischemia   . CVA (cerebral vascular accident) 07/30/2014  . Tobacco dependence   . Carotid artery occlusion  with infarction   . ETOH abuse   . Acute thrombotic stroke 07/25/2014  . CVA (cerebral infarction) 07/25/2014  . Acute ischemic stroke   . Essential hypertension   . Hyperlipidemia   . Tobacco abuse   . Essential hypertension, benign 10/11/2013  . Mixed hyperlipidemia 10/11/2013    Collier Salina, OTR/L 08/31/2014, 4:16 PM  [facility] [address] [place], [place], [address] Phone: [phone]   Fax:  [phone]

## 2014-08-31 NOTE — Patient Instructions (Signed)
  Please complete the assigned speech therapy homework and return it to your next session.  

## 2014-08-31 NOTE — Telephone Encounter (Signed)
Spoke w/mother--AF detected on LINQ. Pt [facility] Spoke w/pt's mother due to not reaching other phone numbers. Mother aware of appointment and will let pt know of appointment. Strips scanned into Epic.

## 2014-09-03 ENCOUNTER — Ambulatory Visit (INDEPENDENT_AMBULATORY_CARE_PROVIDER_SITE_OTHER): Payer: BLUE CROSS/BLUE SHIELD | Admitting: Nurse Practitioner

## 2014-09-03 ENCOUNTER — Encounter: Payer: Self-pay | Admitting: Nurse Practitioner

## 2014-09-03 VITALS — BP 100/64 | HR 83 | Ht 66.0 in | Wt 182.0 lb

## 2014-09-03 DIAGNOSIS — I480 Paroxysmal atrial fibrillation: Secondary | ICD-10-CM | POA: Diagnosis not present

## 2014-09-03 DIAGNOSIS — I10 Essential (primary) hypertension: Secondary | ICD-10-CM | POA: Diagnosis not present

## 2014-09-03 LAB — BASIC METABOLIC PANEL
BUN: 18 mg/dL (ref 6–23)
CO2: 26 meq/L (ref 19–32)
Calcium: 10 mg/dL (ref 8.4–10.5)
Chloride: 101 mEq/L (ref 96–112)
Creatinine, Ser: 0.73 mg/dL (ref 0.40–1.50)
GFR: 117.16 mL/min (ref >60.00)
Glucose, Bld: 107 mg/dL — ABNORMAL HIGH (ref 70–99)
Potassium: 4.1 mEq/L (ref 3.5–5.1)
SODIUM: 135 meq/L (ref 135–145)

## 2014-09-03 LAB — CBC
HCT: 43.8 % (ref 39.0–52.0)
Hemoglobin: 14.6 g/dL (ref 13.0–17.0)
MCHC: 33.3 g/dL (ref 30.0–36.0)
MCV: 94.1 fl (ref 78.0–100.0)
Platelets: 306 10*3/uL (ref 150.0–400.0)
RBC: 4.65 Mil/uL (ref 4.22–5.81)
RDW: 12.9 % (ref 11.5–15.5)
WBC: 8.9 10*3/uL (ref 4.0–10.5)

## 2014-09-03 MED ORDER — ATORVASTATIN CALCIUM 80 MG PO TABS: 80.0000 mg | ORAL_TABLET | Freq: Every day | ORAL | Status: DC

## 2014-09-03 MED ORDER — RIVAROXABAN 20 MG PO TABS: 20.0000 mg | ORAL_TABLET | Freq: Every day | ORAL | Status: DC

## 2014-09-03 NOTE — Patient Instructions (Addendum)
Medication Instructions:   Your physician recommends that you continue on your current medications as directed. EXCEPT PLAVIX  STOP TAKING PLAVIX  START TAKING XERALTO  20 MG ONCE A DAY   Labwork:  BMET AND CBC   Testing/Procedures:   Follow-Up:  WITH [facility] IN 3 MONTHS     Any Other Special Instructions Will Be Listed Below (If Applicable).

## 2014-09-03 NOTE — Progress Notes (Signed)
Electrophysiology Office Note Date: 09/03/2014  ID:  [name], DOB 1956-01-27, MRN [id number]  PCP: Dow Adolph, MD Electrophysiologist: Ladona Ridgel  CC: atrial fibrillation identified on loop recorder  [name] is a 59 y.o. male seen today for Dr Ladona Ridgel.  He was admitted with cryptogenic stroke in 07/2014 and had ILR placed that admission.  Recent remote transmission demonstrated new onset atrial fibrillation and he presents today to discuss anticoagulation.  Since discharge, the patient reports doing reasonably well.  He denies chest pain, palpitations, dyspnea, PND, orthopnea, nausea, vomiting, dizziness, syncope.  He has persistent left arm limited range of motion and numbness. He is frustrated by residual stroke symptoms.   Past Medical History  Diagnosis Date  . Arthritis   . Hypertension   . Hyperlipidemia   . Stroke     a. s/p MDT ILR implant   . Cor triatriatum     a. identified on TEE at time of stroke  . Paroxysmal atrial fibrillation     a. identified on LINQ 08/2014   Past Surgical History  Procedure Laterality Date  . Loop recorder implant  07/27/14    LOOP REVEAL LINQ [id number] - [id number]  . Tee without cardioversion N/A 07/27/2014    Procedure: TRANSESOPHAGEAL ECHOCARDIOGRAM (TEE);  Surgeon: Chrystie Nose, MD;  Location: [facility] ENDOSCOPY;  Service: Cardiovascular;  Laterality: N/A;  . Ep implantable device N/A 07/27/2014    Procedure: Loop Recorder Insertion;  Surgeon: Marinus Maw, MD;  Location: [facility] INVASIVE CV LAB;  Service: Cardiovascular;  Laterality: N/A;    Current Outpatient Prescriptions  Medication Sig Dispense Refill  . aspirin EC 81 MG EC tablet Take 1 tablet (81 mg total) by mouth daily. 30 tablet 0  . atorvastatin (LIPITOR) 80 MG tablet Take 1 tablet (80 mg total) by mouth daily at 6 PM. 30 tablet 1  . lisinopril (PRINIVIL,ZESTRIL) 5 MG tablet Take 1 tablet (5 mg total) by mouth daily. 30 tablet 1  . nicotine (NICODERM CQ - DOSED IN MG/24 HOURS)  14 mg/24hr patch 14 mg patch daily 2 weeks then 7 mg patch daily 3 weeks. 28 patch 0  . polyethylene glycol (MIRALAX / GLYCOLAX) packet Take 17 g by mouth daily. 14 each 0   No current facility-administered medications for this visit.    Allergies:   Review of patient's allergies indicates no known allergies.   Social History: History   Social History  . Marital Status: Married    Spouse Name: N/A  . Number of Children: N/A  . Years of Education: N/A   Occupational History  . Not on file.   Social History Main Topics  . Smoking status: Current Every Day Smoker -- 0.50 packs/day for 40 years    Types: Cigarettes  . Smokeless tobacco: Not on file  . Alcohol Use: 10.8 oz/week    18 Cans of beer per week  . Drug Use: No  . Sexual Activity: Yes   Other Topics Concern  . Not on file   Social History Narrative    Family History: Family History  Problem Relation Age of Onset  . Cancer Father   . Heart attack Brother   . Hypertension Brother     Review of Systems: All other systems reviewed and are otherwise negative except as noted above.   Physical Exam: VS:  BP 100/64 mmHg  Pulse 83  Ht  (1.676 m)  Wt 182 lb (82.555 kg)  BMI 29.39 kg/m2 , BMI Body mass  index is 29.39 kg/(m^2). Wt Readings from Last 3 Encounters:  09/03/14 182 lb (82.555 kg)  08/08/14 174 lb (78.926 kg)  07/29/14 169 lb (76.658 kg)    GEN- The patient is well appearing, alert and oriented x 3 today, left facial droop  HEENT: normocephalic, atraumatic; sclera clear, conjunctiva pink; hearing intact; oropharynx clear; neck supple  Lungs- Clear to ausculation bilaterally, normal work of breathing.  No wheezes, rales, rhonchi Heart- Regular rate and rhythm, no murmurs, rubs or gallops  GI- soft, non-tender, non-distended, bowel sounds present Extremities- no clubbing, cyanosis, or edema; DP/PT/radial pulses 2+ bilaterally MS- no significant deformity or atrophy Skin- warm and dry, no rash  or lesion  Psych- euthymic mood, full affect Neuro- strength and sensation are intact   EKG:  EKG is ordered today. The ekg ordered today shows sinus rhythm, rate 83, poor R wave progression  Recent Labs: 07/31/2014: TSH 3.313 08/02/2014: ALT 26; BUN 10; Creatinine, Ser 0.77; Hemoglobin 14.6; Platelets 292; Potassium 4.0; Sodium 135    Other studies Reviewed: Additional studies/ records that were reviewed today include: hospital records  Assessment and Plan: 1.  Paroxysmal atrial fibrillation The patient has paroxysmal atrial fibrillation identified on LINQ implanted for cryptogenic stroke. Ventricular rates are slightly elevated, but relatively well controlled Start Xarelto 20mg  1 tablet daily for CHADS2VASC of at least 3 BMET, CBC today Pt educated about pathophysiology of atrial fibrillation and importance of anticoagulation today.  Discontinue Plavix With occluded RICA, will discuss with neurology continuing ASA - will continue for now  2.  HTN Stable No change required today   Current medicines are reviewed at length with the patient today.   The patient does not have concerns regarding his medicines.  The following changes were made today:  Stop Plavix, start Xarelto 20mg  daily  Labs/ tests ordered today include: BMET, CBC   Disposition:   Follow up with me in 3 months    Signed, Gypsy BalsamAmber Ramonica Grigg, NP 09/03/2014 11:59 AM   [facility] [address] [place] [address] [phone] (office) [phone] (fax)

## 2014-09-04 NOTE — Progress Notes (Signed)
Note new onset atrial fibrillation with change to Xarelto. No indication for additional aspirin from stroke standpoint. Ok to discontinue.  Rhoderick MoodyBIBY,SHARON  [facility] See Amion for Pager information 09/04/2014 8:52 AM

## 2014-09-05 ENCOUNTER — Ambulatory Visit: Payer: BLUE CROSS/BLUE SHIELD | Admitting: Physical Therapy

## 2014-09-05 ENCOUNTER — Ambulatory Visit: Payer: BLUE CROSS/BLUE SHIELD | Admitting: Occupational Therapy

## 2014-09-05 ENCOUNTER — Ambulatory Visit: Payer: BLUE CROSS/BLUE SHIELD

## 2014-09-05 DIAGNOSIS — I69859 Hemiplegia and hemiparesis following other cerebrovascular disease affecting unspecified side: Secondary | ICD-10-CM | POA: Diagnosis not present

## 2014-09-05 DIAGNOSIS — R29898 Other symptoms and signs involving the musculoskeletal system: Secondary | ICD-10-CM

## 2014-09-05 DIAGNOSIS — H539 Unspecified visual disturbance: Secondary | ICD-10-CM

## 2014-09-05 DIAGNOSIS — I69998 Other sequelae following unspecified cerebrovascular disease: Secondary | ICD-10-CM

## 2014-09-05 DIAGNOSIS — R4189 Other symptoms and signs involving cognitive functions and awareness: Secondary | ICD-10-CM

## 2014-09-05 DIAGNOSIS — I69359 Hemiplegia and hemiparesis following cerebral infarction affecting unspecified side: Secondary | ICD-10-CM

## 2014-09-05 DIAGNOSIS — R209 Unspecified disturbances of skin sensation: Principal | ICD-10-CM

## 2014-09-05 DIAGNOSIS — R41841 Cognitive communication deficit: Secondary | ICD-10-CM

## 2014-09-05 DIAGNOSIS — R41840 Attention and concentration deficit: Secondary | ICD-10-CM

## 2014-09-05 NOTE — Therapy (Signed)
[facility] [address] [place], [place], [address] Phone: [phone]   Fax:  [phone]  Speech Language Pathology Treatment  Patient Details  Name: [name] MRN: [id number] Date of Birth: 09-07-1955 Referring Provider:  Maurice March, MD  Encounter Date: 09/05/2014      End of Session - 09/05/14 1357    Visit Number 6   Number of Visits 16   Date for SLP Re-Evaluation 10/15/14   Authorization - Visit Number 5   Authorization - Number of Visits 30   SLP Start Time 1316   SLP Stop Time  1400   SLP Time Calculation (min) 44 min   Activity Tolerance Patient tolerated treatment well  limited somewhat by attention      Past Medical History  Diagnosis Date  . Arthritis   . Hypertension   . Hyperlipidemia   . Stroke     a. s/p MDT ILR implant   . Cor triatriatum     a. identified on TEE at time of stroke  . Paroxysmal atrial fibrillation     a. identified on LINQ 08/2014    Past Surgical History  Procedure Laterality Date  . Loop recorder implant  07/27/14    LOOP REVEAL LINQ [id number] - [id number]  . Tee without cardioversion N/A 07/27/2014    Procedure: TRANSESOPHAGEAL ECHOCARDIOGRAM (TEE);  Surgeon: Chrystie Nose, MD;  Location: [facility] ENDOSCOPY;  Service: Cardiovascular;  Laterality: N/A;  . Ep implantable device N/A 07/27/2014    Procedure: Loop Recorder Insertion;  Surgeon: Marinus Maw, MD;  Location: [facility] INVASIVE CV LAB;  Service: Cardiovascular;  Laterality: N/A;    There were no vitals filed for this visit.  Visit Diagnosis: Cognitive communication deficit      Subjective Assessment - 09/05/14 1321    Subjective Pt alone in session today. "They put me on a new medicine but I can't think of the name for it. It's a thinner - a blood thinner."               ADULT SLP TREATMENT - 09/05/14 1320    General Information   Behavior/Cognition Cooperative;Pleasant mood   Treatment  Provided   Treatment provided Cognitive-Linquistic   Pain Assessment   Pain Assessment No/denies pain   Cognitive-Linquistic Treatment   Treatment focused on Cognition   Skilled Treatment SLP facilitated therapy for cognitive-linguistic skills of attention and reasoning by having pt copy schedule onto blank calendar. Pt req'd multiple min verbal cues from SLP for alternating attention as well as sustained attention after 12 minutes. After 19 minutes SLP needed to provide mod A usually for sustained attention.    Assessment / Recommendations / Plan   Plan Continue with current plan of care   Progression Toward Goals   Progression toward goals Progressing toward goals            SLP Short Term Goals - 09/05/14 1403    SLP SHORT TERM GOAL #1   Title pt will attend to 10 minute cognitive communication task in a mod distracting environment with rare min A back to task   Time 1   Period Weeks   Status On-going   SLP SHORT TERM GOAL #2   Title pt will complete simple verbal time, money, and math problems 75% success and rare min A   Time 1   Period Weeks   Status On-going   SLP SHORT TERM GOAL #3   Title pt will demonstrate intellectual  awareness to cognitive communicaiton deficits by telling SLP 3 non-physical deficits   Time 1   Period Weeks   Status On-going          SLP Long Term Goals - 09/05/14 1403    SLP LONG TERM GOAL #1   Title pt will demo alternating attention in at least two cognitive communication tasks by having 85% success on each task with rare min A   Time 5   Period Weeks   Status On-going   SLP LONG TERM GOAL #2   Title pt will demonstrate emergent awareness in mod complex cognitive linguistic tasks by correcting errors 90% of opportunities   Time 5   Period Weeks   Status On-going   SLP LONG TERM GOAL #3   Title pt will give appropriate solutions verbally to functional problems 95% of the time   Time 5   Period Weeks   Status On-going   SLP LONG  TERM GOAL #4   Title pt to use some kind of memory compensation system twice during a therapy session   Time 5   Period Weeks   Status On-going          Plan - 09/05/14 1402    Clinical Impression Statement Cont'd deficits in attention/impulsivity and awareness. Skilled ST needs to continue to maximize cognitive-linguistic skills for decr'd caregiver burden.   Speech Therapy Frequency 2x / week   Duration --  5 weeks (or 11 more visits)   Treatment/Interventions Compensatory techniques;Functional tasks;Patient/family education;SLP instruction and feedback;Cognitive reorganization;Environmental controls;Internal/external aids   Potential to Achieve Goals Good   Potential Considerations Ability to learn/carryover information        Problem List Patient Active Problem List   Diagnosis Date Noted  . Left spastic hemiparesis 08/07/2014  . Left-sided neglect 08/07/2014  . Right middle cerebral artery stroke 08/01/2014  . Stroke with cerebral ischemia   . CVA (cerebral vascular accident) 07/30/2014  . Tobacco dependence   . Carotid artery occlusion with infarction   . ETOH abuse   . Acute thrombotic stroke 07/25/2014  . CVA (cerebral infarction) 07/25/2014  . Acute ischemic stroke   . Essential hypertension   . Hyperlipidemia   . Tobacco abuse   . Essential hypertension, benign 10/11/2013  . Mixed hyperlipidemia 10/11/2013    [facility] Center[facility]INKE,Dashanae Longfield , MS, CCC-SLP  09/05/2014, 2:04 PM  [facility] [address] [place], [place], [address] Phone: [phone]   Fax:  [phone]

## 2014-09-05 NOTE — Therapy (Signed)
[facility] [address] [place], [place], [address] Phone: [phone]   Fax:  [phone]  Physical Therapy Treatment  Patient Details  Name: [name] MRN: [id number] Date of Birth: February 12, 1956 Referring Provider:  Maurice March, MD  Encounter Date: 09/05/2014      PT End of Session - 09/05/14 1227    Visit Number 4   Number of Visits 7   Date for PT Re-Evaluation 09/12/14   Authorization Type BCBS   Authorization - Visit Number 4   Authorization - Number of Visits 30   PT Start Time 1149   PT Stop Time 1230   PT Time Calculation (min) 41 min   Activity Tolerance Patient tolerated treatment well   Behavior During Therapy WFL for tasks assessed/performed      Past Medical History  Diagnosis Date  . Arthritis   . Hypertension   . Hyperlipidemia   . Stroke     a. s/p MDT ILR implant   . Cor triatriatum     a. identified on TEE at time of stroke  . Paroxysmal atrial fibrillation     a. identified on LINQ 08/2014    Past Surgical History  Procedure Laterality Date  . Loop recorder implant  07/27/14    LOOP REVEAL LINQ [id number] - [id number]  . Tee without cardioversion N/A 07/27/2014    Procedure: TRANSESOPHAGEAL ECHOCARDIOGRAM (TEE);  Surgeon: Chrystie Nose, MD;  Location: [facility] ENDOSCOPY;  Service: Cardiovascular;  Laterality: N/A;  . Ep implantable device N/A 07/27/2014    Procedure: Loop Recorder Insertion;  Surgeon: Marinus Maw, MD;  Location: [facility] INVASIVE CV LAB;  Service: Cardiovascular;  Laterality: N/A;    There were no vitals filed for this visit.  Visit Diagnosis:  Left leg weakness      Subjective Assessment - 09/05/14 1156    Subjective Denies falls, changes or pain.   Pertinent History h/o CVA x 2; HTN; h/o ETOH abuse   Patient Stated Goals Improve balance and gait    Currently in Pain? No/denies                         [facility] Adult PT Treatment/Exercise -  09/05/14 1211    Knee/Hip Exercises: Aerobic   Stationary Bike Scifit level 2.0 bil LE's and R UE x 8 minutes   Knee/Hip Exercises: Machines for Strengthening   Cybex Leg Press bil LE's x 20 with 50# and LLE x 20 with 40#;cues for terminal knee extension on L   Knee/Hip Exercises: Standing   Other Standing Knee Exercises standing hip abduction, extension, hamstring curl and marching x 15 with bil 4# weights   Other Standing Knee Exercises Pulling onto/off 6" step with bil LE's and 1 UE support x 15;Stepping off/back on 6" step with LLE and 1 UE support                  PT Short Term Goals - 08/14/14 1030    PT SHORT TERM GOAL #1   Title same as LTG's           PT Long Term Goals - 08/14/14 1030    PT LONG TERM GOAL #1   Title Pt. will amb. 1000' on all surfaces independently without LOB.   Time 3   Period Weeks   Status New   PT LONG TERM GOAL #2   Title Negotiate 4 steps without use of rail using a  step over step sequence to demo improved balance   Time 3   Period Weeks   Status New   PT LONG TERM GOAL #3   Title Complete Functional gait assessment and establish goal as appropriate   Time 3   Period Weeks   Status New   PT LONG TERM GOAL #4   Title Independent in HEP for L hamstring and plantarflexor strengthening   Time 3   Period Weeks   Status New               Plan - 09/05/14 1228    Clinical Impression Statement Pt continues with LLE weakness.  Distracted at times and required redirection to stay on task.  Continue PT per POC.   Pt will benefit from skilled therapeutic intervention in order to improve on the following deficits Abnormal gait;Decreased coordination;Decreased balance;Impaired sensation;Decreased mobility;Decreased cognition;Decreased strength;Decreased activity tolerance   Rehab Potential Good   PT Frequency 2x / week   PT Duration 3 weeks   PT Treatment/Interventions ADLs/Self Care Home Management;Functional mobility  training;Stair training;Gait training;Therapeutic activities;Therapeutic exercise;Balance training;Neuromuscular re-education;Patient/family education   PT Next Visit Plan cont with LLE strengthening - plantarflexor and eccentric dorsiflexor strengthening; high level balance training        Problem List Patient Active Problem List   Diagnosis Date Noted  . Left spastic hemiparesis 08/07/2014  . Left-sided neglect 08/07/2014  . Right middle cerebral artery stroke 08/01/2014  . Stroke with cerebral ischemia   . CVA (cerebral vascular accident) 07/30/2014  . Tobacco dependence   . Carotid artery occlusion with infarction   . ETOH abuse   . Acute thrombotic stroke 07/25/2014  . CVA (cerebral infarction) 07/25/2014  . Acute ischemic stroke   . Essential hypertension   . Hyperlipidemia   . Tobacco abuse   . Essential hypertension, benign 10/11/2013  . Mixed hyperlipidemia 10/11/2013    Newell Coralobertson, Jerone Cudmore Terry 09/05/2014, 12:32 PM  [facility] [address] [place], [place], [address] Phone: [phone]   Fax:  [phone]     Maxcine HamDenise Terry Blease Capaldi, PTA [facility] 09/05/2014 12:32 PM Phone: [phone] Fax: [phone]

## 2014-09-05 NOTE — Therapy (Signed)
[facility] [address] [place], [place], [address] Phone: [phone]   [facility]:  [phone]  Occupational Therapy Treatment  Patient Details  Name: [name] SpeckingJerry Hennes MRN: [id number] Date of Birth: 1955-12-12 Referring Provider:  Maurice MarchMcPherson, Barbara B, MD  Encounter Date: 09/05/2014      OT End of Session - 09/05/14 1500    Visit Number 7   Number of Visits 17   Authorization Type BCBS   Authorization - Visit Number 7   OT Start Time 1407   OT Stop Time 1447   OT Time Calculation (min) 40 min   Activity Tolerance Patient tolerated treatment well   Behavior During Therapy WFL for tasks assessed/performed      Past Medical History  Diagnosis Date  . Arthritis   . Hypertension   . Hyperlipidemia   . Stroke     a. s/p MDT ILR implant   . Cor triatriatum     a. identified on TEE at time of stroke  . Paroxysmal atrial fibrillation     a. identified on LINQ 08/2014    Past Surgical History  Procedure Laterality Date  . Loop recorder implant  07/27/14    LOOP REVEAL LINQ [id number] - [id number]  . Tee without cardioversion N/A 07/27/2014    Procedure: TRANSESOPHAGEAL ECHOCARDIOGRAM (TEE);  Surgeon: Chrystie NoseKenneth C Hilty, MD;  [facility];  Service: Cardiovascular;  Laterality: N/A;  . Ep implantable device N/A 07/27/2014    Procedure: Loop Recorder Insertion;  Surgeon: Marinus MawGregg W Taylor, MD;  [facility];  Service: Cardiovascular;  Laterality: N/A;    There were no vitals filed for this visit.  Visit Diagnosis:  Sensation alteration, late effect of cerebrovascular disease  Impaired cognition  Inattention  Visual disturbance  Hemiparesis affecting nondominant side as late effect of cerebrovascular accident      Subjective Assessment - 09/05/14 1731    Subjective  Pt reports increasing sensation in LUE   Pertinent History see snapshot   Patient Stated Goals return to gardening, independence with  self care.   Currently in Pain? No/denies       Treatment: supine closed chain shoulder flexion with mdium ball, v.c. For LUE hand position due to inattention. seated edge of mat closed chain shoulder flexion with PVC pipe frame, to mid range, mod facilitation/ v.c. Hemiglide for shoulder flexion in seated min facilitation, mod v.c. Standing functional reaching to place remove cones from mid range surface. Flipping large  playing cards with min-mod facilitation. Pt's hand was stiff and swollen initally. He reports it has been hanging down most of the day, improvement noted by end of session..                         OT Short Term Goals - 08/16/14 1702    OT SHORT TERM GOAL #1   Title Patient will independently don socks   Time 4   Period Weeks   Status On-going   OT SHORT TERM GOAL #2   Title Patient will demonstrate sufficient pincer grasp to tighten shoelace with left hand   Time 4   Period Weeks   Status On-going   OT SHORT TERM GOAL #3   Title Patient will visually attend to activity presented in midline for 10 minutes in minimally distracting environment   Time 4   Period Weeks   Status On-going   OT SHORT TERM GOAL #4   Title Patient will  demonstrate ability to reach left arm overhead (at least 140 degrees shoulder flexion) while upright to hit target and repeat 5 times in preparation for overhead reach / release or grasp   Status On-going   OT SHORT TERM GOAL #5   Title Patient will prepare a simple cold snack following 2 step directives in minimally distracting environment   Status On-going           OT Long Term Goals - 08/16/14 1702    OT LONG TERM GOAL #1   Title Patient will dress himself with modified independence   Time 8   Period Weeks   Status On-going   OT LONG TERM GOAL #2   Title Patient will shower himself, including transfer into and out of tub with modified independence   Time 8   Period Weeks   Status On-going   OT LONG  TERM GOAL #3   Title Patient will complete grooming tasks independently, using visula cues for thoroughness (eg - shaving using mirror for guidance)   Time 8   Period Weeks   Status On-going   OT LONG TERM GOAL #4   Title Patient will effectively use long handled gardeing tool to hoe/rake garden in standing using bilateral upper extremities   Time 8   Period Weeks   Status On-going   OT LONG TERM GOAL #5   Title Patient will demonstrate safe use of knife for cutting food at a meal, or during meal preparation.  9This goal requires effective use of two hands in this task)   Time 8   Period Weeks   Status On-going               Plan - 09/05/14 1732    Clinical Impression Statement Pt demonstrates improving functional use of LUE. pt continues to require v.c. to attend to LUE as he has poor sensation.   Pt will benefit from skilled therapeutic intervention in order to improve on the following deficits (Retired) Decreased balance;Decreased cognition;Decreased coordination;Decreased endurance;Decreased knowledge of precautions;Decreased knowledge of use of DME;Decreased range of motion;Decreased safety awareness;Impaired perceived functional ability;Increased edema;Decreased strength;Impaired sensation;Impaired tone;Impaired UE functional use;Impaired vision/preception   Rehab Potential Good   OT Frequency 2x / week   OT Duration 8 weeks   OT Treatment/Interventions Self-care/ADL training;Electrical Stimulation;Therapeutic exercise;Neuromuscular education;DME and/or AE instruction;Manual Therapy;Functional Mobility Training;Splinting;Therapeutic exercises;Therapeutic activities;Cognitive remediation/compensation;Visual/perceptual remediation/compensation;Patient/family education;Balance training   Plan reinforce home activity with pt/ wife   Consulted and Agree with Plan of Care Patient        Problem List Patient Active Problem List   Diagnosis Date Noted  . Left spastic  hemiparesis 08/07/2014  . Left-sided neglect 08/07/2014  . Right middle cerebral artery stroke 08/01/2014  . Stroke with cerebral ischemia   . CVA (cerebral vascular accident) 07/30/2014  . Tobacco dependence   . Carotid artery occlusion with infarction   . ETOH abuse   . Acute thrombotic stroke 07/25/2014  . CVA (cerebral infarction) 07/25/2014  . Acute ischemic stroke   . Essential hypertension   . Hyperlipidemia   . Tobacco abuse   . Essential hypertension, benign 10/11/2013  . Mixed hyperlipidemia 10/11/2013    Mattix Imhof 09/05/2014, 5:35 PM Keene Breath, OTR/L [phone] Phone: [phone] 5:36 PM 09/05/2014 [facility] [address] [place], [place], [address] Phone: [phone]   Fax:  [phone]

## 2014-09-05 NOTE — Telephone Encounter (Signed)
Meds ordered this encounter  Medications  . atorvastatin (LIPITOR) 80 MG tablet    Sig: TAKE 1 TABLET BY MOUTH DAILY AT 6 PM    Dispense:  30 tablet    Refill:  0  . clopidogrel (PLAVIX) 75 MG tablet    Sig: TAKE 1 TABLET BY MOUTH DAILY    Dispense:  30 tablet    Refill:  0

## 2014-09-06 ENCOUNTER — Ambulatory Visit (INDEPENDENT_AMBULATORY_CARE_PROVIDER_SITE_OTHER): Payer: BLUE CROSS/BLUE SHIELD | Admitting: Physician Assistant

## 2014-09-06 ENCOUNTER — Encounter: Payer: Self-pay | Admitting: Physician Assistant

## 2014-09-06 ENCOUNTER — Ambulatory Visit: Payer: BLUE CROSS/BLUE SHIELD

## 2014-09-06 ENCOUNTER — Ambulatory Visit: Payer: BLUE CROSS/BLUE SHIELD | Admitting: Occupational Therapy

## 2014-09-06 ENCOUNTER — Ambulatory Visit: Payer: BLUE CROSS/BLUE SHIELD | Admitting: Physical Therapy

## 2014-09-06 VITALS — BP 138/87 | HR 88 | Temp 98.7°F | Resp 16 | Ht 66.0 in | Wt 183.2 lb

## 2014-09-06 DIAGNOSIS — E782 Mixed hyperlipidemia: Secondary | ICD-10-CM

## 2014-09-06 DIAGNOSIS — R41840 Attention and concentration deficit: Secondary | ICD-10-CM

## 2014-09-06 DIAGNOSIS — I639 Cerebral infarction, unspecified: Secondary | ICD-10-CM

## 2014-09-06 DIAGNOSIS — F17200 Nicotine dependence, unspecified, uncomplicated: Secondary | ICD-10-CM | POA: Diagnosis not present

## 2014-09-06 DIAGNOSIS — R29898 Other symptoms and signs involving the musculoskeletal system: Secondary | ICD-10-CM

## 2014-09-06 DIAGNOSIS — I69859 Hemiplegia and hemiparesis following other cerebrovascular disease affecting unspecified side: Secondary | ICD-10-CM | POA: Diagnosis not present

## 2014-09-06 DIAGNOSIS — I4891 Unspecified atrial fibrillation: Secondary | ICD-10-CM | POA: Insufficient documentation

## 2014-09-06 DIAGNOSIS — E785 Hyperlipidemia, unspecified: Secondary | ICD-10-CM

## 2014-09-06 DIAGNOSIS — F1010 Alcohol abuse, uncomplicated: Secondary | ICD-10-CM | POA: Diagnosis not present

## 2014-09-06 DIAGNOSIS — I480 Paroxysmal atrial fibrillation: Secondary | ICD-10-CM | POA: Diagnosis not present

## 2014-09-06 DIAGNOSIS — R269 Unspecified abnormalities of gait and mobility: Secondary | ICD-10-CM

## 2014-09-06 DIAGNOSIS — I69359 Hemiplegia and hemiparesis following cerebral infarction affecting unspecified side: Secondary | ICD-10-CM

## 2014-09-06 DIAGNOSIS — R41841 Cognitive communication deficit: Secondary | ICD-10-CM

## 2014-09-06 DIAGNOSIS — R4189 Other symptoms and signs involving cognitive functions and awareness: Secondary | ICD-10-CM

## 2014-09-06 DIAGNOSIS — R209 Unspecified disturbances of skin sensation: Secondary | ICD-10-CM

## 2014-09-06 DIAGNOSIS — H539 Unspecified visual disturbance: Secondary | ICD-10-CM

## 2014-09-06 DIAGNOSIS — I69998 Other sequelae following unspecified cerebrovascular disease: Secondary | ICD-10-CM

## 2014-09-06 MED ORDER — ATORVASTATIN CALCIUM 80 MG PO TABS: ORAL_TABLET | ORAL | Status: DC

## 2014-09-06 NOTE — Patient Instructions (Signed)
Practice picking up checkers, dominoes, flip playing cards, or pick up plastic cups with your left hand.  Don't hold anything breakable, sharp or hot with the left hand.

## 2014-09-06 NOTE — Therapy (Signed)
[facility] [address] [place], [place], [address] Phone: [phone]   Fax:  [phone]  Speech Language Pathology Treatment  Patient Details  Name: [name] MRN: [id number] Date of Birth: 1955/07/06 Referring Provider:  Barton Fanny, MD  Encounter Date: 09/06/2014      End of Session - 09/06/14 1027    Visit Number 7   Number of Visits 16   Date for SLP Re-Evaluation 10/15/14   Authorization - Visit Number 6   Authorization - Number of Visits 30   SLP Start Time 0934   SLP Stop Time  0175   SLP Time Calculation (min) 41 min   Activity Tolerance Patient tolerated treatment well      Past Medical History  Diagnosis Date  . Arthritis   . Hypertension   . Hyperlipidemia   . Stroke     a. s/p MDT ILR implant   . Cor triatriatum     a. identified on TEE at time of stroke  . Paroxysmal atrial fibrillation     a. identified on LINQ 08/2014    Past Surgical History  Procedure Laterality Date  . Loop recorder implant  07/27/14    LOOP REVEAL LINQ [id number] - [id number]  . Tee without cardioversion N/A 07/27/2014    Procedure: TRANSESOPHAGEAL ECHOCARDIOGRAM (TEE);  Surgeon: Pixie Casino, MD;  Location: [facility];  Service: Cardiovascular;  Laterality: N/A;  . Ep implantable device N/A 07/27/2014    Procedure: Loop Recorder Insertion;  Surgeon: Evans Lance, MD;  Location: [facility];  Service: Cardiovascular;  Laterality: N/A;    There were no vitals filed for this visit.  Visit Diagnosis: Cognitive communication deficit      Subjective Assessment - 09/05/14 1321    Subjective Pt alone in session today. "They put me on a new medicine but I can't think of the name for it. It's a thinner - a blood thinner."               ADULT SLP TREATMENT - 09/06/14 0942    General Information   Behavior/Cognition Alert;Pleasant mood;Cooperative   Treatment Provided   Treatment provided  Cognitive-Linquistic   Pain Assessment   Pain Assessment No/denies pain   Cognitive-Linquistic Treatment   Treatment focused on Cognition   Skilled Treatment SLP facilitated therapy with awareness and attention as focus. Pt required total A for emergent awareness and min A rarely for sustained attention. In alternating from diagram to question re: diagram in written task, and from conversation back to written task, pt req'd min verbal cues usually. Skilled observation was used to note that pt was able to maintain sustained attention in simple conversation for approx 10 minutes, and that pt's skill level did not appear to change with repeated cues from SLP that pt's alternating attention between tasks was reduced. In telling SLP simple 8-step sequence told verbally pt req'd total A for omitted steps, occasionally.   Assessment / Recommendations / Plan   Plan Continue with current plan of care   Progression Toward Goals   Progression toward goals Progressing toward goals          SLP Education - 09/06/14 1026    Education Details deficit areas of error awareness (emergent awareness), attention   Person(s) Educated Patient   Methods Explanation;Demonstration   Comprehension Verbal cues required;Need further instruction          SLP Short Term Goals - 09/06/14 1028  SLP SHORT TERM GOAL #1   Title pt will attend to 10 minute cognitive communication task in a mod distracting environment with rare min A back to task   Status Not Met   SLP SHORT TERM GOAL #2   Title pt will complete simple verbal time, money, and math problems 75% success and rare min A   Status Not Met   SLP SHORT TERM GOAL #3   Title pt will demonstrate intellectual awareness to cognitive communicaiton deficits by telling SLP 3 non-physical deficits   Status Not Met          SLP Long Term Goals - 09/06/14 1029    SLP LONG TERM GOAL #1   Title pt will demo alternating attention in at least two cognitive  communication tasks by having 85% success on each task with occasional min A   Time 5   Period Weeks   Status Revised  revised 09-05-14   SLP LONG TERM GOAL #2   Title pt will demonstrate emergent awareness in simple cognitive linguistic tasks by correcting errors 80% of opportunities   Time 5   Period Weeks   Status Revised  revised 09-05-14   SLP LONG TERM GOAL #3   Title pt will give appropriate solutions verbally to functional problems 95% of the time   Time 5   Period Weeks   Status On-going   SLP LONG TERM GOAL #4   Title pt to use some kind of memory compensation system twice during a therapy session   Time 5   Period Weeks   Status On-going          Plan - 09/06/14 1029    Clinical Impression Statement Pt did not meet any STGs. LTGs adjusted accordingly. Cont'd deficits in attention/impulsivity and awareness. Skilled ST needs to continue to maximize cognitive-linguistic skills for decr'd caregiver burden.        Problem List Patient Active Problem List   Diagnosis Date Noted  . Left spastic hemiparesis 08/07/2014  . Left-sided neglect 08/07/2014  . Right middle cerebral artery stroke 08/01/2014  . Stroke with cerebral ischemia   . CVA (cerebral vascular accident) 07/30/2014  . Tobacco dependence   . Carotid artery occlusion with infarction   . ETOH abuse   . Acute thrombotic stroke 07/25/2014  . CVA (cerebral infarction) 07/25/2014  . Acute ischemic stroke   . Essential hypertension   . Hyperlipidemia   . Tobacco abuse   . Essential hypertension, benign 10/11/2013  . Mixed hyperlipidemia 10/11/2013    [facility] , MS, CCC-SLP  09/06/2014, 10:33 AM  [facility] [address] [place], [place], [address] Phone: [phone]   Fax:  [phone]

## 2014-09-06 NOTE — Therapy (Addendum)
[facility] [address] [place], [place], [address] Phone: [phone]   Fax:  [phone]  Occupational Therapy Treatment  Patient Details  Name: [name] MRN: [id number] Date of Birth: 04-Aug-1955 Referring Provider:  Barton Fanny, MD  Encounter Date: 09/06/2014      OT End of Session - 09/11/14 1544    Visit Number 10   Number of Visits 17   Date for OT Re-Evaluation 10/10/14   Authorization Type BCBS   Authorization - Visit Number 10   Authorization - Number of Visits 30   OT Start Time 0626   OT Stop Time 1015   OT Time Calculation (min) 40 min   Activity Tolerance Patient tolerated treatment well   Behavior During Therapy [facility] for tasks assessed/performed      Past Medical History  Diagnosis Date  . Arthritis   . Hypertension   . Hyperlipidemia   . Stroke     a. s/p MDT ILR implant   . Cor triatriatum     a. identified on TEE at time of stroke  . Paroxysmal atrial fibrillation     a. identified on LINQ 08/2014    Past Surgical History  Procedure Laterality Date  . Loop recorder implant  07/27/14    LOOP REVEAL LINQ [id number] - [id number]  . Tee without cardioversion N/A 07/27/2014    Procedure: TRANSESOPHAGEAL ECHOCARDIOGRAM (TEE);  Surgeon: Pixie Casino, MD;  Location: [facility];  Service: Cardiovascular;  Laterality: N/A;  . Ep implantable device N/A 07/27/2014    Procedure: Loop Recorder Insertion;  Surgeon: Evans Lance, MD;  Location: [facility];  Service: Cardiovascular;  Laterality: N/A;    There were no vitals filed for this visit.  Visit Diagnosis:  Hemiparesis affecting nondominant side as late effect of cerebrovascular accident  Sensation alteration, late effect of cerebrovascular disease  Impaired cognition  Visual disturbance  Inattention      Subjective Assessment - 09/11/14 0939    Subjective  It's feeling better really   Pertinent History see  snapshot   Patient Stated Goals return to gardening, independence with self care.   Currently in Pain? No/denies       Treatment: Neuromuscular re-ed for shoulder flexion, closed chain in supine for overhead stretch. Seated AA/ROM shoulder flexion mid range with PVC pipe frame,  min facilitation/ v.c. To attend to LUE. Theraputic activities:Standing functional reaching to place rings on midlevel targets with LUE, min/ mod difficulty min v.c. Facilitation. Flipping playing cards with LUE, mod difficulty, min v.c. Placing checkers in connect 4 frame with LUE for incr. fine motor coordination, min facillitation, v.c.to attend to LUE, mod difficulty placing checkers. Pt required v.c. throughout activities to maintain focus on task.                         OT Short Term Goals - 09/11/14 3818    OT SHORT TERM GOAL #1   Title Patient will independently don socks   Baseline Pt reports he is not performing, encouraged pt to perform at home-09/11/14   Status On-going   OT SHORT TERM GOAL #2   Title Patient will demonstrate sufficient pincer grasp to tighten shoelace with left hand   Baseline Pt is unable to perform consistently   Status On-going   OT SHORT TERM GOAL #3   Title Patient will visually attend to activity presented in midline for 10 minutes in minimally distracting environment  Status On-going   OT SHORT TERM GOAL #4   Title Patient will demonstrate ability to reach left arm overhead (at least 140 degrees shoulder flexion) while upright to hit target and repeat 5 times in preparation for overhead reach / release or grasp   Baseline demonstrates ability to perform   Status Achieved   OT SHORT TERM GOAL #5   Title Patient will prepare a simple cold snack following 2 step directives in minimally distracting environment   Baseline min v.c., pt left water running   Status Partially Met           OT Long Term Goals - 08/16/14 1702    OT LONG TERM GOAL #1    Title Patient will dress himself with modified independence   Time 8   Period Weeks   Status On-going   OT LONG TERM GOAL #2   Title Patient will shower himself, including transfer into and out of tub with modified independence   Time 8   Period Weeks   Status On-going   OT LONG TERM GOAL #3   Title Patient will complete grooming tasks independently, using visula cues for thoroughness (eg - shaving using mirror for guidance)   Time 8   Period Weeks   Status On-going   OT LONG TERM GOAL #4   Title Patient will effectively use long handled gardeing tool to hoe/rake garden in standing using bilateral upper extremities   Time 8   Period Weeks   Status On-going   OT LONG TERM GOAL #5   Title Patient will demonstrate safe use of knife for cutting food at a meal, or during meal preparation.  9This goal requires effective use of two hands in this task)   Time 8   Period Weeks   Status On-going               Plan - 09/06/14 3154    Clinical Impression Statement Pt is progressing towards goals with improving left hand functional use. Pt reports improving sensation in LUE.   Pt will benefit from skilled therapeutic intervention in order to improve on the following deficits (Retired) Decreased balance;Decreased cognition;Decreased coordination;Decreased endurance;Decreased knowledge of precautions;Decreased knowledge of use of DME;Decreased range of motion;Decreased safety awareness;Impaired perceived functional ability;Increased edema;Decreased strength;Impaired sensation;Impaired tone;Impaired UE functional use;Impaired vision/preception   Rehab Potential Good   OT Frequency 2x / week   OT Duration 8 weeks   OT Treatment/Interventions Self-care/ADL training;Electrical Stimulation;Therapeutic exercise;Neuromuscular education;DME and/or AE instruction;Manual Therapy;Functional Mobility Training;Splinting;Therapeutic exercises;Therapeutic activities;Cognitive  remediation/compensation;Visual/perceptual remediation/compensation;Patient/family education;Balance training   Plan reinforce LUE functional use, neuro re-ed   OT Home Exercise Plan issued 09/06/14, simple functional use of LUE   Consulted and Agree with Plan of Care Patient        Problem List Patient Active Problem List   Diagnosis Date Noted  . Alterations of sensations following CVA (cerebrovascular accident) 09/10/2014  . Atrial fibrillation 09/06/2014  . Left spastic hemiparesis 08/07/2014  . Left-sided neglect 08/07/2014  . Right middle cerebral artery stroke 08/01/2014  . Tobacco dependence   . Carotid artery occlusion with infarction   . History of ETOH abuse   . Hyperlipidemia   . Essential hypertension, benign 10/11/2013    RINE,KATHRYN 09/11/2014, 5:56 PM Theone Murdoch, OTR/L [phone] Phone: [phone] 5:56 PM 09/11/2014 [facility] [address] [place], [place], [address] Phone: [phone]   Fax:  [phone]

## 2014-09-06 NOTE — Therapy (Signed)
[facility] [address] [place], [place], [address] Phone: [phone]   Fax:  [phone]  Physical Therapy Treatment  Patient Details  Name: [name] MRN: [id number] Date of Birth: 07/01/1955 Referring Provider:  Maurice March, MD  Encounter Date: 09/06/2014      PT End of Session - 09/06/14 0932    Visit Number 5   Number of Visits 7   Date for PT Re-Evaluation 09/12/14   Authorization Type BCBS   Authorization - Visit Number 5   Authorization - Number of Visits 30   PT Start Time [phone]   PT Stop Time 0930   PT Time Calculation (min) 40 min   Activity Tolerance Patient tolerated treatment well   Behavior During Therapy [facility] for tasks assessed/performed      Past Medical History  Diagnosis Date  . Arthritis   . Hypertension   . Hyperlipidemia   . Stroke     a. s/p MDT ILR implant   . Cor triatriatum     a. identified on TEE at time of stroke  . Paroxysmal atrial fibrillation     a. identified on LINQ 08/2014    Past Surgical History  Procedure Laterality Date  . Loop recorder implant  07/27/14    LOOP REVEAL LINQ [id number] - [id number]  . Tee without cardioversion N/A 07/27/2014    Procedure: TRANSESOPHAGEAL ECHOCARDIOGRAM (TEE);  Surgeon: Chrystie Nose, MD;  Location: [facility];  Service: Cardiovascular;  Laterality: N/A;  . Ep implantable device N/A 07/27/2014    Procedure: Loop Recorder Insertion;  Surgeon: Marinus Maw, MD;  Location: [facility];  Service: Cardiovascular;  Laterality: N/A;    There were no vitals filed for this visit.  Visit Diagnosis:  Left leg weakness  Abnormality of gait      Subjective Assessment - 09/06/14 0901    Subjective Denies falls or changes.  Feels like he is getting stronger.   Pertinent History h/o CVA x 2; HTN; h/o ETOH abuse   Patient Stated Goals Improve balance and gait    Currently in Pain? No/denies                          [facility] Adult PT Treatment/Exercise - 09/06/14 0903    Knee/Hip Exercises: Stretches   Gastroc Stretch Both;60 seconds;Other (comment)  off edge of step   Knee/Hip Exercises: Aerobic   Elliptical x 40 seconds forward level 1.5 before fatiqued and needing to rest   Knee/Hip Exercises: Machines for Strengthening   Cybex Leg Press bil LE's x 20 with 60# and LLE x 20 with 60#;cues for terminal knee extension on L   Knee/Hip Exercises: Standing   Other Standing Knee Exercises Heel raises bil x 20 the LLE only x 20   Ankle Exercises: Machines for Strengthening   Cybex Leg Press 40# for ankle plantarflexion/dorsiflexion bil ankles then L ankle x 20 reps each             Balance Exercises - 09/06/14 1307    Balance Exercises: Standing   Rockerboard Anterior/posterior;Lateral;Head turns;10 reps;Intermittent UE support;Other (comment)  Bosu on black platform with UE support-not head turns   Other Standing Exercises Cone taps, double taps and tipping/uprighting on level surface       Needs rest breaks during session       PT Short Term Goals - 08/14/14 1030    PT SHORT TERM GOAL #1  Title same as LTG's           PT Long Term Goals - 08/14/14 1030    PT LONG TERM GOAL #1   Title Pt. will amb. 1000' on all surfaces independently without LOB.   Time 3   Period Weeks   Status New   PT LONG TERM GOAL #2   Title Negotiate 4 steps without use of rail using a step over step sequence to demo improved balance   Time 3   Period Weeks   Status New   PT LONG TERM GOAL #3   Title Complete Functional gait assessment and establish goal as appropriate   Time 3   Period Weeks   Status New   PT LONG TERM GOAL #4   Title Independent in HEP for L hamstring and plantarflexor strengthening   Time 3   Period Weeks   Status New               Plan - 09/06/14 1610    Clinical Impression Statement Pt continues with inattention to L side.   LLE fatigues quickly with strengthening activities.  Continue PT per POC.   Pt will benefit from skilled therapeutic intervention in order to improve on the following deficits Abnormal gait;Decreased coordination;Decreased balance;Impaired sensation;Decreased mobility;Decreased cognition;Decreased strength;Decreased activity tolerance   Rehab Potential Good   PT Frequency 2x / week   PT Duration 3 weeks   PT Treatment/Interventions ADLs/Self Care Home Management;Functional mobility training;Stair training;Gait training;Therapeutic activities;Therapeutic exercise;Balance training;Neuromuscular re-education;Patient/family education   PT Next Visit Plan cont with LLE strengthening - plantarflexor and eccentric dorsiflexor strengthening; high level balance training   Consulted and Agree with Plan of Care Patient        Problem List Patient Active Problem List   Diagnosis Date Noted  . Atrial fibrillation 09/06/2014  . Left spastic hemiparesis 08/07/2014  . Left-sided neglect 08/07/2014  . Right middle cerebral artery stroke 08/01/2014  . Tobacco dependence   . Carotid artery occlusion with infarction   . History of ETOH abuse   . Hyperlipidemia   . Essential hypertension, benign 10/11/2013    Newell Coral 09/06/2014, 1:09 PM   [facility] [address] [place], [place], [address] Phone: [phone]   Fax:  [phone]     Newell Coral, [facility] 09/06/2014 1:09 PM Phone: [phone] Fax: [phone]

## 2014-09-06 NOTE — Progress Notes (Signed)
Patient ID: [name], male    DOB: 1955-07-14, 59 y.o.   MRN: [id number]  PCP: Carrigan Delafuente, PA-C  Subjective:   Chief Complaint  Patient presents with  . Follow-up    hospital  . Cerebrovascular Accident    HPI Presents for hospital follow-up.  He was admitted through the ER on 07/25/14 with LEFT facial droop, slurred speech and neglect of the LEFT side. He was found to have a RIGHT MCA infarct, embolic from a RIGHT ICA occlusion.  Loop recorder was placed and he was discharged home on ASA and Plavix. On 7/18 the LINQ identified new atrial fibrillation and so cardiology stopped the Plavix and started Xarelto. He also saw neurology regarding this for coordination of medical therapy.  He is engaged with his outpatient therapy visits, though remains frustrated by the deficits. He doesn't have weakness, but is unable to feel his LEFT hand and drops things. They are working on training him to be aware of the LEFT side, to improve his function and to avoid additional injury from bumping into things.  He has quit drinking, and is working on smoking cessation, which is much harder. He's using a nicotine patch and seems motivated to quit.   Review of Systems  Constitutional: Negative for fever, chills and fatigue.  HENT: Negative for drooling, trouble swallowing and voice change.   Eyes: Negative for visual disturbance.  Respiratory: Negative for cough, choking, chest tightness and shortness of breath.   Cardiovascular: Negative for chest pain, palpitations and leg swelling.  Gastrointestinal: Negative for nausea.  Genitourinary: Negative for dysuria, urgency and frequency.  Musculoskeletal: Negative for myalgias, back pain, joint swelling, arthralgias, gait problem, neck pain and neck stiffness.  Skin: Negative for wound.  Neurological: Positive for numbness (LEFT hand). Negative for dizziness, tremors, weakness, light-headedness and headaches.       Patient Active Problem List   Diagnosis Date Noted  . Atrial fibrillation 09/06/2014  . Left spastic hemiparesis 08/07/2014  . Left-sided neglect 08/07/2014  . Right middle cerebral artery stroke 08/01/2014  . Tobacco dependence   . Carotid artery occlusion with infarction   . History of ETOH abuse   . Hyperlipidemia   . Essential hypertension, benign 10/11/2013     Prior to Admission medications   Medication Sig Start Date End Date Taking? Authorizing Provider  aspirin EC 81 MG EC tablet Take 1 tablet (81 mg total) by mouth daily. 07/27/14  Yes Erasmo Downer, MD  atorvastatin (LIPITOR) 80 MG tablet TAKE 1 TABLET BY MOUTH DAILY AT 6 PM 09/06/14  Yes Allyson Tineo, PA-C  lisinopril (PRINIVIL,ZESTRIL) 5 MG tablet Take 1 tablet (5 mg total) by mouth daily. 08/10/14  Yes Daniel J Angiulli, PA-C  nicotine (NICODERM CQ - DOSED IN MG/24 HOURS) 14 mg/24hr patch 14 mg patch daily 2 weeks then 7 mg patch daily 3 weeks. 08/10/14  Yes Daniel J Angiulli, PA-C  polyethylene glycol (MIRALAX / GLYCOLAX) packet Take 17 g by mouth daily. 08/01/14  Yes Ashly Hulen Skains, DO  rivaroxaban (XARELTO) 20 MG TABS tablet Take 1 tablet (20 mg total) by mouth daily with supper. 09/03/14  Yes Amber Caryl Bis, NP     No Known Allergies     Objective:  Physical Exam  Constitutional: He is oriented to person, place, and time. Vital signs are normal. He appears well-developed and well-nourished. He is active and cooperative. No distress.  BP 138/87 mmHg  Pulse 88  Temp(Src) 98.7 F (37.1 C) (Oral)  Resp  16  Ht 5\' 6"  (1.676 m)  Wt 183 lb 3.2 oz (83.099 kg)  BMI 29.58 kg/m2  SpO2 96%  HENT:  Head: Normocephalic and atraumatic.  Right Ear: Hearing normal.  Left Ear: Hearing normal.  Eyes: Conjunctivae are normal. No scleral icterus.  Neck: Normal range of motion. Neck supple. No thyromegaly present.  Cardiovascular: Normal rate, regular rhythm and normal heart sounds.   Pulses:      Radial pulses are 2+ on the right side, and 2+ on  the left side.  Pulmonary/Chest: Effort normal and breath sounds normal.  Lymphadenopathy:       Head (right side): No tonsillar, no preauricular, no posterior auricular and no occipital adenopathy present.       Head (left side): No tonsillar, no preauricular, no posterior auricular and no occipital adenopathy present.    He has no cervical adenopathy.       Right: No supraclavicular adenopathy present.       Left: No supraclavicular adenopathy present.  Neurological: He is alert and oriented to person, place, and time. He has normal strength.  Inattention tot he LEFT side. Good strength, but poor sensation on the LEFT.  Skin: Skin is warm, dry and intact. No rash noted. No cyanosis or erythema. Nails show no clubbing.  Psychiatric: He has a normal mood and affect. His behavior is normal. His speech is slurred (minimally). His speech is not rapid and/or pressured, not delayed and not tangential. He is communicative.           Assessment & Plan:   1. Tobacco dependence Encouraged his continued efforts. We could consider adding Chantix or Wellbutrin in the future if he's still having trouble.  2. History of ETOH abuse Congratulated on eliminating alcohol!  3. CVA (cerebral vascular accident) Continue to follow up with therapy, neurology, etc. Continue working on smoking cessation.  4. Mixed hyperlipidemia COntinue lipitor 80 mg QD. - atorvastatin (LIPITOR) 80 MG tablet; TAKE 1 TABLET BY MOUTH DAILY AT 6 PM  Dispense: 90 tablet; Refill: 3  6. Paroxysmal atrial fibrillation Normal rhythm today. Continue follow-up with cardiology and anti-coagulation per their recommendations.   Fernande Bras, PA-C Physician Assistant-Certified [facility]

## 2014-09-06 NOTE — Patient Instructions (Signed)
Keep working on quitting smoking.

## 2014-09-10 ENCOUNTER — Encounter: Payer: Self-pay | Admitting: Physical Medicine & Rehabilitation

## 2014-09-10 ENCOUNTER — Encounter: Payer: BLUE CROSS/BLUE SHIELD | Attending: Physical Medicine & Rehabilitation

## 2014-09-10 ENCOUNTER — Ambulatory Visit (INDEPENDENT_AMBULATORY_CARE_PROVIDER_SITE_OTHER): Payer: BLUE CROSS/BLUE SHIELD | Admitting: Internal Medicine

## 2014-09-10 ENCOUNTER — Encounter: Payer: Self-pay | Admitting: Internal Medicine

## 2014-09-10 ENCOUNTER — Ambulatory Visit (HOSPITAL_BASED_OUTPATIENT_CLINIC_OR_DEPARTMENT_OTHER): Payer: BLUE CROSS/BLUE SHIELD | Admitting: Physical Medicine & Rehabilitation

## 2014-09-10 VITALS — BP 112/68 | HR 86 | Ht 66.0 in | Wt 184.4 lb

## 2014-09-10 VITALS — BP 113/73 | HR 77 | Resp 14

## 2014-09-10 DIAGNOSIS — I10 Essential (primary) hypertension: Secondary | ICD-10-CM | POA: Insufficient documentation

## 2014-09-10 DIAGNOSIS — F17200 Nicotine dependence, unspecified, uncomplicated: Secondary | ICD-10-CM

## 2014-09-10 DIAGNOSIS — I480 Paroxysmal atrial fibrillation: Secondary | ICD-10-CM

## 2014-09-10 DIAGNOSIS — R414 Neurologic neglect syndrome: Secondary | ICD-10-CM | POA: Diagnosis not present

## 2014-09-10 DIAGNOSIS — I69898 Other sequelae of other cerebrovascular disease: Secondary | ICD-10-CM

## 2014-09-10 DIAGNOSIS — G8110 Spastic hemiplegia affecting unspecified side: Secondary | ICD-10-CM

## 2014-09-10 DIAGNOSIS — R208 Other disturbances of skin sensation: Secondary | ICD-10-CM

## 2014-09-10 DIAGNOSIS — I69354 Hemiplegia and hemiparesis following cerebral infarction affecting left non-dominant side: Secondary | ICD-10-CM | POA: Diagnosis not present

## 2014-09-10 DIAGNOSIS — M1990 Unspecified osteoarthritis, unspecified site: Secondary | ICD-10-CM | POA: Diagnosis not present

## 2014-09-10 DIAGNOSIS — E785 Hyperlipidemia, unspecified: Secondary | ICD-10-CM | POA: Insufficient documentation

## 2014-09-10 DIAGNOSIS — I69398 Other sequelae of cerebral infarction: Secondary | ICD-10-CM

## 2014-09-10 DIAGNOSIS — G8114 Spastic hemiplegia affecting left nondominant side: Secondary | ICD-10-CM

## 2014-09-10 DIAGNOSIS — R209 Unspecified disturbances of skin sensation: Secondary | ICD-10-CM

## 2014-09-10 HISTORY — DX: Other sequelae of cerebral infarction: I69.398

## 2014-09-10 LAB — CUP PACEART INCLINIC DEVICE CHECK: Date Time Interrogation Session: 20160725163708

## 2014-09-10 NOTE — Patient Instructions (Signed)
Medication Instructions:  Your physician recommends that you continue on your current medications as directed. Please refer to the Current Medication list given to you today.   Labwork: NONE  Testing/Procedures: NONE  Follow-Up: Your physician wants you to follow-up in: 12 months with Dr. Taylor. You will receive a reminder letter in the mail two months in advance. If you don't receive a letter, please call our office to schedule the follow-up appointment.   Any Other Special Instructions Will Be Listed Below (If Applicable).   

## 2014-09-10 NOTE — Progress Notes (Signed)
Subjective:    Patient ID: [name], male    DOB: [birth year], 59 y.o.   MRN: [id number] 59 year old right-handed male, with history of hypertension, right MCA territory infarct on July 25, 2014 with single right M2 branch occlusion identified just beyond the bifurcation and occluded right ICA origin.  Underwent loop recorder placement per Dr. Ladona Ridgel, discharged to home on aspirin as well as Plavix.  On July 27, 2014 was independent without assisted device.  The patient had been working full-time prior to initial admission. Presented on July 29, 2014, with increasing left-sided weakness and slurred speech.  An MRI of the brain showed acute moderate right middle cerebral artery territory infarct further involvement of the operculum, new involvement of the Insula and right basal ganglia.  Recent echocardiogram with ejection fraction of 70%, grade I diastolic dysfunction.  DATE OF ADMISSION:  08/01/2014 DATE OF DISCHARGE:  08/10/2014  HPI Patient was discharged from [facility] inpatient rehabilitation unit. Has been receiving outpatient physical occupational and speech therapy. I have reviewed all notes. Patient has followed up with his primary care physician as well. Blood pressure has been under good control No longer drinks alcohol Still smokes but only about 1 cigarette per day No falls at home Still needs some help with dressing from his wife. Sometimes putting both legs into 1 pant leg   Pain Inventory Average Pain 0 Pain Right Now 0 My pain is no pain  In the last 24 hours, has pain interfered with the following? General activity 0 Relation with others 0 Enjoyment of life 0 What TIME of day is your pain at its worst? no pain Sleep (in general) Good  Pain is worse with: no pain Pain improves with: no pain Relief from Meds: no pain  Mobility walk without assistance how many minutes can you walk? 30 ability to climb steps?  yes do you drive?  no Do you have any  goals in this area?  yes  Function employed # of hrs/week . 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 Cancer Father   . Heart attack Brother   . Hypertension Brother    History   Social History  . Marital Status: Married    Spouse Name: N/A  . Number of Children: N/A  . Years of Education: N/A   Social History Main Topics  . Smoking status: Current Every Day Smoker -- 0.50 packs/day for 40 years    Types: Cigarettes  . Smokeless tobacco: Not on file  . Alcohol Use: 10.8 oz/week    18 Cans of beer per week  . Drug Use: No  . Sexual Activity: Yes   Other Topics Concern  . None   Social History Narrative   Past Surgical History  Procedure Laterality Date  . Loop recorder implant  07/27/14    LOOP REVEAL LINQ VHQ46 - [id number]  . Tee without cardioversion N/A 07/27/2014    Procedure: TRANSESOPHAGEAL ECHOCARDIOGRAM (TEE);  Surgeon: Chrystie Nose, MD;  Location: [facility] ENDOSCOPY;  Service: Cardiovascular;  Laterality: N/A;  . Ep implantable device N/A 07/27/2014    Procedure: Loop Recorder Insertion;  Surgeon: Marinus Maw, MD;  Location: [facility] INVASIVE CV LAB;  Service: Cardiovascular;  Laterality: N/A;  Past Medical History  Diagnosis Date  . Arthritis   . Hypertension   . Hyperlipidemia   . Stroke     a. s/p MDT ILR implant   . Cor triatriatum     a. identified on TEE at time of stroke  . Paroxysmal atrial fibrillation     a. identified on LINQ 08/2014   BP 113/73 mmHg  Pulse 77  Resp 14  SpO2 96%  Opioid Risk Score:   Fall Risk Score:  `1  Depression screen PHQ 2/9  Depression screen [facility] 2/9 09/10/2014 09/06/2014 05/10/2014 01/05/2014  Decreased Interest 3 0 0 0  Down,  Depressed, Hopeless 1 1 0 0  PHQ - 2 Score 4 1 0 0  Altered sleeping 1 - - -  Tired, decreased energy 1 - - -  Change in appetite 0 - - -  Feeling bad or failure about yourself  1 - - -  Trouble concentrating 0 - - -  Moving slowly or fidgety/restless 1 - - -  Suicidal thoughts 0 - - -  PHQ-9 Score 8 - - -     Review of Systems  Neurological: Positive for numbness.  Psychiatric/Behavioral: Positive for dysphoric mood.  All other systems reviewed and are negative.      Objective:   Physical Exam  Constitutional: He is oriented to person, place, and time. He appears well-developed and well-nourished.  HENT:  Head: Normocephalic and atraumatic.  Eyes: Conjunctivae and EOM are normal. Pupils are equal, round, and reactive to light.  Neck: Normal range of motion.  Neurological: He is alert and oriented to person, place, and time. A sensory deficit is present. Coordination and gait abnormal.  Does not cooperate with visual field testing.   Absent light touch sensation in the left upper extremity  Motor strength is 3+/5 in the left deltoid, biceps, triceps, grip  4/5 in the left hip flexor, 5/5 in the knee extensor and ankle dorsiflexor, 4/5 and ankle plantar flexor  Decreased fine motor control left upper extremity  5/5 in the right deltoid, biceps, triceps, grip, hip flexor, knee extensor, ankle dorsiflexor and plantar flexor  No evidence of toe drag or knee instability, does have wide base of support unable to do toe walking on the left side due to weakness  Nursing note and vitals reviewed.         Assessment & Plan:  1. Right MCA distribution infarct with left upper greater than left lower extremity weakness as well as left neglect and cognitive deficits related to CVA Recommend continued outpatient PT, OT, speech therapy No driving at the present time Unable to return to work at the present time Follow-up physical medicine rehabilitation  6 weeks  2. Possible  cardiac arrhythmia as etiology for CVA, follow-up with cardiology today to interrogate loop recorder  3. Stroke risk factors include smoking as well as Hypertension and hyperlipidemia. Follow-up neurology Follow-up primary care physician, Reviewed PCP notes

## 2014-09-10 NOTE — Patient Instructions (Signed)
Please keep up your effort at quitting smoking

## 2014-09-10 NOTE — Assessment & Plan Note (Signed)
He will continue his anti-coagulation. He is asymptomatic. No other med changes.

## 2014-09-10 NOTE — Progress Notes (Signed)
HPI [name] returns today for followup. He is a pleasant 59yo man with a stroke who underwent insertion of an ILR several weeks ago. In the interim he has had an episode of atrial fibrillation lasting over 5 hours. He has been placed on Xarelto. He has no additional complaints. No syncope. He still has a left HP. No Known Allergies   Current Outpatient Prescriptions  Medication Sig Dispense Refill  . aspirin EC 81 MG EC tablet Take 1 tablet (81 mg total) by mouth daily. 30 tablet 0  . atorvastatin (LIPITOR) 80 MG tablet TAKE 1 TABLET BY MOUTH DAILY AT 6 PM 90 tablet 3  . lisinopril (PRINIVIL,ZESTRIL) 5 MG tablet Take 1 tablet (5 mg total) by mouth daily. 30 tablet 1  . nicotine (NICODERM CQ - DOSED IN MG/24 HOURS) 14 mg/24hr patch 14 mg patch daily 2 weeks then 7 mg patch daily 3 weeks. 28 patch 0  . polyethylene glycol (MIRALAX / GLYCOLAX) packet Take 17 g by mouth daily. 14 each 0  . rivaroxaban (XARELTO) 20 MG TABS tablet Take 1 tablet (20 mg total) by mouth daily with supper. 30 tablet 6   No current facility-administered medications for this visit.     Past Medical History  Diagnosis Date  . Arthritis   . Hypertension   . Hyperlipidemia   . Stroke     a. s/p MDT ILR implant   . Cor triatriatum     a. identified on TEE at time of stroke  . Paroxysmal atrial fibrillation     a. identified on LINQ 08/2014    ROS:   All systems reviewed and negative except as noted in the HPI.   Past Surgical History  Procedure Laterality Date  . Loop recorder implant  07/27/14    LOOP REVEAL LINQ ZOX09 - [id number]  . Tee without cardioversion N/A 07/27/2014    Procedure: TRANSESOPHAGEAL ECHOCARDIOGRAM (TEE);  Surgeon: Chrystie Nose, MD;  Location: [facility] ENDOSCOPY;  Service: Cardiovascular;  Laterality: N/A;  . Ep implantable device N/A 07/27/2014    Procedure: Loop Recorder Insertion;  Surgeon: Marinus Maw, MD;  Location: [facility] INVASIVE CV LAB;  Service: Cardiovascular;   Laterality: N/A;     Family History  Problem Relation Age of Onset  . Cancer Father   . Heart attack Brother   . Hypertension Brother      History   Social History  . Marital Status: Married    Spouse Name: N/A  . Number of Children: N/A  . Years of Education: N/A   Occupational History  . Not on file.   Social History Main Topics  . Smoking status: Current Every Day Smoker -- 0.50 packs/day for 40 years    Types: Cigarettes  . Smokeless tobacco: Not on file  . Alcohol Use: 10.8 oz/week    18 Cans of beer per week  . Drug Use: No  . Sexual Activity: Yes   Other Topics Concern  . Not on file   Social History Narrative     BP 112/68 mmHg  Pulse 86  Ht 5\' 6"  (1.676 m)  Wt 184 lb 6.4 oz (83.643 kg)  BMI 29.78 kg/m2  SpO2 97%  Physical Exam:  Well appearing NAD HEENT: Unremarkable Neck:  No JVD, no thyromegally Lymphatics:  No adenopathy Back:  No CVA tenderness Lungs:  Clear HEART:  Regular rate rhythm, no murmurs, no rubs, no clicks Abd:  soft, positive bowel sounds, no organomegally, no  rebound, no guarding Ext:  2 plus pulses, no edema, no cyanosis, no clubbing Skin:  No rashes no nodules Neuro:  CN II through XII intact, motor grossly intact except for left hemiparesis and minimal expressive aphasia.    DEVICE  Normal device function.  See PaceArt for details. ILR shows atrial fib  Assess/Plan:

## 2014-09-10 NOTE — Assessment & Plan Note (Signed)
His blood pressure is well controlled. No change in his meds.  

## 2014-09-10 NOTE — Assessment & Plan Note (Signed)
He is encouraged to stop smoking. Will follow.  

## 2014-09-11 ENCOUNTER — Ambulatory Visit: Payer: BLUE CROSS/BLUE SHIELD | Admitting: Occupational Therapy

## 2014-09-11 ENCOUNTER — Ambulatory Visit: Payer: BLUE CROSS/BLUE SHIELD | Admitting: Speech Pathology

## 2014-09-11 ENCOUNTER — Ambulatory Visit: Payer: BLUE CROSS/BLUE SHIELD | Admitting: Physical Therapy

## 2014-09-11 DIAGNOSIS — R209 Unspecified disturbances of skin sensation: Secondary | ICD-10-CM

## 2014-09-11 DIAGNOSIS — R29898 Other symptoms and signs involving the musculoskeletal system: Secondary | ICD-10-CM

## 2014-09-11 DIAGNOSIS — R41840 Attention and concentration deficit: Secondary | ICD-10-CM

## 2014-09-11 DIAGNOSIS — H539 Unspecified visual disturbance: Secondary | ICD-10-CM

## 2014-09-11 DIAGNOSIS — I69359 Hemiplegia and hemiparesis following cerebral infarction affecting unspecified side: Secondary | ICD-10-CM

## 2014-09-11 DIAGNOSIS — I69859 Hemiplegia and hemiparesis following other cerebrovascular disease affecting unspecified side: Secondary | ICD-10-CM | POA: Diagnosis not present

## 2014-09-11 DIAGNOSIS — R4189 Other symptoms and signs involving cognitive functions and awareness: Secondary | ICD-10-CM

## 2014-09-11 DIAGNOSIS — I69998 Other sequelae following unspecified cerebrovascular disease: Secondary | ICD-10-CM

## 2014-09-11 DIAGNOSIS — R269 Unspecified abnormalities of gait and mobility: Secondary | ICD-10-CM

## 2014-09-11 NOTE — Therapy (Signed)
[facility] [address] [place], [place], [address] Phone: [phone]   Fax:  [phone]  Occupational Therapy Treatment  Patient Details  Name: [name] MRN: [id number] Date of Birth: Jul 04, 1955 Referring Provider:  Barton Fanny, MD  Encounter Date: 09/11/2014      OT End of Session - 09/11/14 1544    Visit Number 10   Number of Visits 17   Date for OT Re-Evaluation 10/10/14   Authorization Type BCBS   Authorization - Visit Number 10   Authorization - Number of Visits 30   OT Start Time 6962   OT Stop Time 1015   OT Time Calculation (min) 40 min   Activity Tolerance Patient tolerated treatment well   Behavior During Therapy [facility] for tasks assessed/performed      Past Medical History  Diagnosis Date  . Arthritis   . Hypertension   . Hyperlipidemia   . Stroke     a. s/p MDT ILR implant   . Cor triatriatum     a. identified on TEE at time of stroke  . Paroxysmal atrial fibrillation     a. identified on LINQ 08/2014    Past Surgical History  Procedure Laterality Date  . Loop recorder implant  07/27/14    LOOP REVEAL LINQ XBM84 - [id number]  . Tee without cardioversion N/A 07/27/2014    Procedure: TRANSESOPHAGEAL ECHOCARDIOGRAM (TEE);  Surgeon: Pixie Casino, MD;  Location: [facility];  Service: Cardiovascular;  Laterality: N/A;  . Ep implantable device N/A 07/27/2014    Procedure: Loop Recorder Insertion;  Surgeon: Evans Lance, MD;  Location: [facility] CV LAB;  Service: Cardiovascular;  Laterality: N/A;    There were no vitals filed for this visit.  Visit Diagnosis:  Visual disturbance  Inattention  Hemiparesis affecting nondominant side as late effect of cerebrovascular accident  Impaired cognition  Sensation alteration, late effect of cerebrovascular disease      Subjective Assessment - 09/11/14 0939    Subjective  It's feeling better really   Pertinent History see  snapshot   Patient Stated Goals return to gardening, independence with self care.   Currently in Pain? No/denies      Treatment: Self care: Therapist started checking progress towards goals. Pt made a cheese sandwich. He washed his hands yet left the water running an went to retrieve the other items. Pt required v.c. to turn off the water. Pt retrieved the bread and cheese and made a sandwich . Pt requires min v.c. to incorporate LUE, and to make sure cheese was in between slices of bread.Pt practiced using pincer grasp to tie/ tighten lace of his shoes, he demonstrated significant difficulty with tasks, mod v.c.fFor positioning and to watch LUE Pt to practice at home. Neuro re-ed, 2 sets fo 10 reps for closed chain shoulder flexion with PVC pipe frame, mid range, min v.c./facilitation for left hand grasp. Dynamic step and reach to retrieve cylindrical items and place on various surfaces, min facilitation/v.c.                          OT Short Term Goals - 09/11/14 7253    OT SHORT TERM GOAL #1   Title Patient will independently don socks   Baseline Pt reports he is not performing, encouraged pt to perform at home-09/11/14   Status On-going   OT [place] #2   Title Patient will demonstrate sufficient pincer grasp to  tighten shoelace with left hand   Baseline Pt is unable to perform consistently   Status On-going   OT SHORT TERM GOAL #3   Title Patient will visually attend to activity presented in midline for 10 minutes in minimally distracting environment   Status On-going   OT [place] #4   Title Patient will demonstrate ability to reach left arm overhead (at least 140 degrees shoulder flexion) while upright to hit target and repeat 5 times in preparation for overhead reach / release or grasp   Baseline demonstrates ability to perform   Status Achieved   OT SHORT TERM GOAL #5   Title Patient will prepare a simple cold snack following 2 step directives in  minimally distracting environment   Baseline min v.c., pt left water running   Status Partially Met           OT Long Term Goals - 08/16/14 1702    OT LONG TERM GOAL #1   Title Patient will dress himself with modified independence   Time 8   Period Weeks   Status On-going   OT LONG TERM GOAL #2   Title Patient will shower himself, including transfer into and out of tub with modified independence   Time 8   Period Weeks   Status On-going   OT LONG TERM GOAL #3   Title Patient will complete grooming tasks independently, using visula cues for thoroughness (eg - shaving using mirror for guidance)   Time 8   Period Weeks   Status On-going   OT LONG TERM GOAL #4   Title Patient will effectively use long handled gardeing tool to hoe/rake garden in standing using bilateral upper extremities   Time 8   Period Weeks   Status On-going   OT LONG TERM GOAL #5   Title Patient will demonstrate safe use of knife for cutting food at a meal, or during meal preparation.  9This goal requires effective use of two hands in this task)   Time 8   Period Weeks   Status On-going               Plan - 09/11/14 6295    Clinical Impression Statement Pt is progressing towards goals. He demonstrates improved LUE grasp/ relase and functional reach. Pt met  STG 4 he demonstrates progress towards remaining goals.   Pt will benefit from skilled therapeutic intervention in order to improve on the following deficits (Retired) Decreased balance;Decreased cognition;Decreased coordination;Decreased endurance;Decreased knowledge of precautions;Decreased knowledge of use of DME;Decreased range of motion;Decreased safety awareness;Impaired perceived functional ability;Increased edema;Decreased strength;Impaired sensation;Impaired tone;Impaired UE functional use;Impaired vision/preception   Rehab Potential Good   OT Frequency 2x / week   OT Duration 8 weeks   OT Treatment/Interventions Self-care/ADL  training;Electrical Stimulation;Therapeutic exercise;Neuromuscular education;DME and/or AE instruction;Manual Therapy;Functional Mobility Training;Splinting;Therapeutic exercises;Therapeutic activities;Cognitive remediation/compensation;Visual/perceptual remediation/compensation;Patient/family education;Balance training   Plan assess STG 3, LUE functional use, neuro re-ed   OT Home Exercise Plan issued 09/06/14, simple functional use of LUE   Consulted and Agree with Plan of Care Patient        Problem List Patient Active Problem List   Diagnosis Date Noted  . Alterations of sensations following CVA (cerebrovascular accident) 09/10/2014  . Atrial fibrillation 09/06/2014  . Left spastic hemiparesis 08/07/2014  . Left-sided neglect 08/07/2014  . Right middle cerebral artery stroke 08/01/2014  . Tobacco dependence   . Carotid artery occlusion with infarction   . History of ETOH abuse   . Hyperlipidemia   .  Essential hypertension, benign 10/11/2013    Jolissa Kapral 09/11/2014, 3:47 PM Theone Murdoch, OTR/L [phone] Phone: [phone] 3:47 PM 09/11/2014 [facility] [address] [place], [place], [address] Phone: [phone]   Fax:  [phone]

## 2014-09-11 NOTE — Therapy (Signed)
[facility] [address] [place], [place], [address] Phone: [phone]   Fax:  [phone]  Physical Therapy Treatment  Patient Details  Name: [name] MRN: [id number] Date of Birth: 1955/11/01 Referring Provider:  Barton Fanny, MD  Encounter Date: 09/11/2014      PT End of Session - 09/11/14 1022    Visit Number 6   Number of Visits 7   Date for PT Re-Evaluation 09/12/14   Authorization Type BCBS   Authorization - Visit Number 6   Authorization - Number of Visits 30   PT Start Time [phone]   PT Stop Time 0932   PT Time Calculation (min) 40 min   Activity Tolerance Patient tolerated treatment well   Behavior During Therapy [facility] for tasks assessed/performed      Past Medical History  Diagnosis Date  . Arthritis   . Hypertension   . Hyperlipidemia   . Stroke     a. s/p MDT ILR implant   . Cor triatriatum     a. identified on TEE at time of stroke  . Paroxysmal atrial fibrillation     a. identified on LINQ 08/2014    Past Surgical History  Procedure Laterality Date  . Loop recorder implant  07/27/14    LOOP REVEAL LINQ MWN02 - [id number]  . Tee without cardioversion N/A 07/27/2014    Procedure: TRANSESOPHAGEAL ECHOCARDIOGRAM (TEE);  Surgeon: Pixie Casino, MD;  Location: [facility];  Service: Cardiovascular;  Laterality: N/A;  . Ep implantable device N/A 07/27/2014    Procedure: Loop Recorder Insertion;  Surgeon: Evans Lance, MD;  Location: [place] CV LAB;  Service: Cardiovascular;  Laterality: N/A;    There were no vitals filed for this visit.  Visit Diagnosis:  Abnormality of gait  Left leg weakness      Subjective Assessment - 09/11/14 0855    Subjective Denies falls or changes.    Pertinent History h/o CVA x 2; HTN; h/o ETOH abuse   Patient Stated Goals Improve balance and gait    Currently in Pain? No/denies                         [facility] Adult PT  Treatment/Exercise - 09/11/14 0902    Ambulation/Gait   Ambulation/Gait Yes   Ambulation/Gait Assistance 7: Independent   Ambulation Distance (Feet) 1000 Feet   Assistive device None   Gait Pattern Within Functional Limits   Ambulation Surface Level;Unlevel;Indoor;Outdoor;Paved;Grass;Other (comment)  concrete   Stairs Yes   Stairs Assistance 7: Independent   Stair Management Technique No rails   Number of Stairs 4  repeated x 4   Height of Stairs 6   Door Management 7: Independent   Ramp 7: Independent   Curb 7: Independent   Gait Comments decreased attention to L side but did no LOB or bumping into objects   Knee/Hip Exercises: Aerobic   Elliptical x 30 seconds forward and 30 seconds backward level 1.5   Knee/Hip Exercises: Machines for Strengthening   Cybex Leg Press bil LE's x 15 x 70# and LLE only x 8 x 2 70#   Knee/Hip Exercises: Standing   Heel Raises Left;10 reps;2 seconds   Hip Abduction AROM;Stengthening;Left;10 reps;Other (comment)  red theraband   Hip Extension AROM;Stengthening;Left;10 reps;Other (comment)  red theraband   Lateral Step Up Left;15 reps;Hand Hold: 1;Step Height: 6"   Forward Step Up Left;15 reps;Hand Hold: 1;Step Height: 6"   Step  Down Left;15 reps;Hand Hold: 1;Step Height: 6"                  PT Short Term Goals - 08/14/14 1030    PT SHORT TERM GOAL #1   Title same as LTG's           PT Long Term Goals - 09/11/14 1022    PT LONG TERM GOAL #1   Title Pt. will amb. 1000' on all surfaces independently without LOB.   Time 3   Period Weeks   Status Achieved   PT LONG TERM GOAL #2   Title Negotiate 4 steps without use of rail using a step over step sequence to demo improved balance   Time 3   Period Weeks   Status Achieved   PT LONG TERM GOAL #3   Title Complete Functional gait assessment and establish goal as appropriate   Time 3   Period Weeks   Status New   PT LONG TERM GOAL #4   Title Independent in HEP for L hamstring  and plantarflexor strengthening   Time 3   Period Weeks   Status On-going               Plan - 09/11/14 1023    Clinical Impression Statement Pt met LTG 1 and 2.  Need to f/u on HEP.  Pt reports he does not think he still has HEP handouts.  Discuss possible d/c with Guido Sander, PT, as pt is at end of POC.   Pt will benefit from skilled therapeutic intervention in order to improve on the following deficits Abnormal gait;Decreased coordination;Decreased balance;Impaired sensation;Decreased mobility;Decreased cognition;Decreased strength;Decreased activity tolerance   Rehab Potential Good   PT Frequency 2x / week   PT Duration 3 weeks   PT Treatment/Interventions ADLs/Self Care Home Management;Functional mobility training;Stair training;Gait training;Therapeutic activities;Therapeutic exercise;Balance training;Neuromuscular re-education;Patient/family education   PT Next Visit Plan Finish checking goals and discuss plan with Guido Sander, PT.   Consulted and Agree with Plan of Care Patient        Problem List Patient Active Problem List   Diagnosis Date Noted  . Alterations of sensations following CVA (cerebrovascular accident) 09/10/2014  . Atrial fibrillation 09/06/2014  . Left spastic hemiparesis 08/07/2014  . Left-sided neglect 08/07/2014  . Right middle cerebral artery stroke 08/01/2014  . Tobacco dependence   . Carotid artery occlusion with infarction   . History of ETOH abuse   . Hyperlipidemia   . Essential hypertension, benign 10/11/2013    Narda Bonds 09/11/2014, 10:25 AM  [facility] [address] [place], [place], [address] Phone: [phone]   Fax:  [place], [place] 09/11/2014 10:25 AM Phone: [phone] Fax: [phone]

## 2014-09-12 ENCOUNTER — Ambulatory Visit: Payer: BLUE CROSS/BLUE SHIELD

## 2014-09-12 ENCOUNTER — Ambulatory Visit: Payer: BLUE CROSS/BLUE SHIELD | Admitting: Occupational Therapy

## 2014-09-12 ENCOUNTER — Ambulatory Visit: Payer: BLUE CROSS/BLUE SHIELD | Admitting: Physical Therapy

## 2014-09-12 ENCOUNTER — Encounter: Payer: Self-pay | Admitting: Occupational Therapy

## 2014-09-12 DIAGNOSIS — R4189 Other symptoms and signs involving cognitive functions and awareness: Secondary | ICD-10-CM

## 2014-09-12 DIAGNOSIS — R41841 Cognitive communication deficit: Secondary | ICD-10-CM

## 2014-09-12 DIAGNOSIS — R41840 Attention and concentration deficit: Secondary | ICD-10-CM

## 2014-09-12 DIAGNOSIS — I69998 Other sequelae following unspecified cerebrovascular disease: Secondary | ICD-10-CM

## 2014-09-12 DIAGNOSIS — I69359 Hemiplegia and hemiparesis following cerebral infarction affecting unspecified side: Secondary | ICD-10-CM

## 2014-09-12 DIAGNOSIS — H539 Unspecified visual disturbance: Secondary | ICD-10-CM

## 2014-09-12 DIAGNOSIS — R209 Unspecified disturbances of skin sensation: Secondary | ICD-10-CM

## 2014-09-12 DIAGNOSIS — I69859 Hemiplegia and hemiparesis following other cerebrovascular disease affecting unspecified side: Secondary | ICD-10-CM | POA: Diagnosis not present

## 2014-09-12 NOTE — Therapy (Signed)
[facility] [address] [place], [place], [address] Phone: [phone]   Fax:  [phone]  Occupational Therapy Treatment  Patient Details  Name: [name] MRN: [id number] Date of Birth: Aug 16, 1955 Referring Provider:  Barton Fanny, MD  Encounter Date: 09/12/2014      OT End of Session - 09/12/14 1344    Visit Number 11   Number of Visits 17   Date for OT Re-Evaluation 10/10/14   Authorization Type [employer]   Authorization - Visit Number 11   Authorization - Number of Visits 30   OT Start Time 2122   OT Stop Time 1320   OT Time Calculation (min) 60 min   Activity Tolerance Patient tolerated treatment well   Behavior During Therapy WFL for tasks assessed/performed      Past Medical History  Diagnosis Date  . Arthritis   . Hypertension   . Hyperlipidemia   . Stroke     a. s/p MDT ILR implant   . Cor triatriatum     a. identified on TEE at time of stroke  . Paroxysmal atrial fibrillation     a. identified on LINQ 08/2014    Past Surgical History  Procedure Laterality Date  . Loop recorder implant  07/27/14    LOOP REVEAL LINQ [id number] - [id number]  . Tee without cardioversion N/A 07/27/2014    Procedure: TRANSESOPHAGEAL ECHOCARDIOGRAM (TEE);  Surgeon: Pixie Casino, MD;  Location: [facility];  Service: Cardiovascular;  Laterality: N/A;  . Ep implantable device N/A 07/27/2014    Procedure: Loop Recorder Insertion;  Surgeon: Evans Lance, MD;  Location: [facility];  Service: Cardiovascular;  Laterality: N/A;    There were no vitals filed for this visit.  Visit Diagnosis:  Impaired cognition  Visual disturbance  Inattention  Sensation alteration, late effect of cerebrovascular disease  Hemiparesis affecting nondominant side as late effect of cerebrovascular accident      Subjective Assessment - 09/12/14 1326    Subjective  I am even doing a little better using my left hand.   Pertinent History see snapshot   Patient Stated Goals return to gardening, independence with self care.   Currently in Pain? No/denies   Pain Score 0-No pain                      OT Treatments/Exercises (OP) - 09/12/14 0001    ADLs   UB Dressing Patient unable to don sleeveless tshirt after 3 attempts without set up and min cueing.  Patient able to identify most obvious errors, but unable to self correct.  When strategy provided, patient able to don shirt correctly with only set up assistance.   LB Dressing Patient able to utilize left hand effectively to pinch / release shoelace today when shoe presented in his lap.  Patient having difficulty motor planning transitions during this task, and needed intermittent / moderate assistance to tie shoelace using both hands.  Patient able to tie shoe lace while shoe on foot, if foot elevated.  Patient has difficulty bending down to floor in sitting.  Patient did well in quiet treatment room with little distractions to help aide attention to task.   Driving Patient's wife indicated that patient has attempted to drive around the block.  Reinforced with patient and wife that driving at this time was not at all safe due to severity of concentration and visual deficits - left neglect.  Both patient and his wife  indicated that they understood that patient should not be driving at this time due to the high risk of injuring himself or others.     Cognitive Exercises   Other Cognitive Exercises 1 Patient issued the MVPT to assess his ability to follow simple directions in a minimally distracting (to moderately distracting) environment.  Patient given overt instruction that he was going to need to attend carefully to various sets of instructions, and not attend to extraneous stimuli.  Patient did well, even able to inhibit impulsive responses to search for accurate responses.  Significant decline in performance as environment became more distracting  (auditory and visual stimulation impeded performance.)   Visual/Perceptual Exercises   Letter Search Able to complete very simple large print word search with visual cue to help with scanning to far left of page.  This is an activity that he enjoyed prior to recent hospitalization.                 OT Education - 09/12/14 1343    Education provided Yes   Education Details Importance of dressing self - specifically tshirt, socks, shoes.  Practice tying shoelaces with shoe in lap.    Person(s) Educated Patient;Spouse   Methods Explanation;Demonstration   Comprehension Verbalized understanding;Verbal cues required          OT Short Term Goals - 09/12/14 1349    OT SHORT TERM GOAL #1   Title Patient will independently don socks   Baseline Pt reports he is not performing, encouraged pt to perform at home-09/11/14   Time 4   Period Weeks   Status Partially Met   OT SHORT TERM GOAL #2   Title Patient will demonstrate sufficient pincer grasp to tighten shoelace with left hand   Baseline Pt is unable to perform consistently   Time 4   Status Achieved   OT SHORT TERM GOAL #3   Title Patient will visually attend to activity presented in midline for 10 minutes in minimally distracting environment   Period Weeks   Status Achieved   OT SHORT TERM GOAL #5   Title Patient will prepare a simple cold snack following 2 step directives in minimally distracting environment   Baseline min v.c., pt left water running   Status Partially Met           OT Long Term Goals - 08/16/14 1702    OT LONG TERM GOAL #1   Title Patient will dress himself with modified independence   Time 8   Period Weeks   Status On-going   OT LONG TERM GOAL #2   Title Patient will shower himself, including transfer into and out of tub with modified independence   Time 8   Period Weeks   Status On-going   OT LONG TERM GOAL #3   Title Patient will complete grooming tasks independently, using visula cues for  thoroughness (eg - shaving using mirror for guidance)   Time 8   Period Weeks   Status On-going   OT LONG TERM GOAL #4   Title Patient will effectively use long handled gardeing tool to hoe/rake garden in standing using bilateral upper extremities   Time 8   Period Weeks   Status On-going   OT LONG TERM GOAL #5   Title Patient will demonstrate safe use of knife for cutting food at a meal, or during meal preparation.  9This goal requires effective use of two hands in this task)   Time 8   Period Weeks  Status On-going               Plan - 09/12/14 1345    Clinical Impression Statement Patient continues to show signifcant cognitive, visual and sensory impairments which impede independence with ADL, yet patient is showing steady improvement in use of left hand, scanning to left in paper/pencil task, and improved focused attention   Pt will benefit from skilled therapeutic intervention in order to improve on the following deficits (Retired) Decreased balance;Decreased cognition;Decreased coordination;Decreased endurance;Decreased knowledge of precautions;Decreased knowledge of use of DME;Decreased range of motion;Decreased safety awareness;Impaired perceived functional ability;Increased edema;Decreased strength;Impaired sensation;Impaired tone;Impaired UE functional use;Impaired vision/preception   Rehab Potential Good   OT Frequency 2x / week   OT Duration 8 weeks   OT Treatment/Interventions Self-care/ADL training;Electrical Stimulation;Therapeutic exercise;Neuromuscular education;DME and/or AE instruction;Manual Therapy;Functional Mobility Training;Splinting;Therapeutic exercises;Therapeutic activities;Cognitive remediation/compensation;Visual/perceptual remediation/compensation;Patient/family education;Balance training   Plan Reinforce ADL - Patient wants to shower without assistance from wife.  Bilateral tasks involving visual scanning, and simple problem solving, NMR left UE -  Compensate for decreased sensation   OT Home Exercise Plan issued 09/06/14, simple functional use of LUE, added home activity to incorporate more particiaption in ADL   Consulted and Agree with Plan of Care Patient        Problem List Patient Active Problem List   Diagnosis Date Noted  . Alterations of sensations following CVA (cerebrovascular accident) 09/10/2014  . Atrial fibrillation 09/06/2014  . Left spastic hemiparesis 08/07/2014  . Left-sided neglect 08/07/2014  . Right middle cerebral artery stroke 08/01/2014  . Tobacco dependence   . Carotid artery occlusion with infarction   . History of ETOH abuse   . Hyperlipidemia   . Essential hypertension, benign 10/11/2013    Mariah Milling, OTR/L 09/12/2014, 1:50 PM  [facility] [address] [place], [place], [address] Phone: [phone]   Fax:  [phone]

## 2014-09-12 NOTE — Patient Instructions (Signed)
  Please complete the assigned speech therapy homework and return it to your next session.  

## 2014-09-12 NOTE — Therapy (Signed)
[facility] [address] [place], [place], [address] Phone: [phone]   Fax:  [phone]  Speech Language Pathology Treatment  Patient Details  Name: [name] MRN: [id number] Date of Birth: 05/28/1955 Referring Provider:  Barton Fanny, MD  Encounter Date: 09/12/2014      End of Session - 09/12/14 1142    Visit Number 8   Number of Visits 16   Date for SLP Re-Evaluation 10/15/14   Authorization - Visit Number 7   Authorization - Number of Visits 30   SLP Start Time 1109   SLP Stop Time  1145   SLP Time Calculation (min) 36 min   Activity Tolerance Patient tolerated treatment well      Past Medical History  Diagnosis Date  . Arthritis   . Hypertension   . Hyperlipidemia   . Stroke     a. s/p MDT ILR implant   . Cor triatriatum     a. identified on TEE at time of stroke  . Paroxysmal atrial fibrillation     a. identified on LINQ 08/2014    Past Surgical History  Procedure Laterality Date  . Loop recorder implant  07/27/14    LOOP REVEAL LINQ [id number] - [id number]  . Tee without cardioversion N/A 07/27/2014    Procedure: TRANSESOPHAGEAL ECHOCARDIOGRAM (TEE);  Surgeon: Pixie Casino, MD;  Location: [facility];  Service: Cardiovascular;  Laterality: N/A;  . Ep implantable device N/A 07/27/2014    Procedure: Loop Recorder Insertion;  Surgeon: Evans Lance, MD;  Location: [facility];  Service: Cardiovascular;  Laterality: N/A;    There were no vitals filed for this visit.  Visit Diagnosis: Cognitive communication deficit      Subjective Assessment - 09/12/14 1119    Subjective Pt arrived approx 10 mintues late to tx. "I was watching too much of that NFLchannel this morning." Pt alone in therapy today.               ADULT SLP TREATMENT - 09/12/14 1121    General Information   Behavior/Cognition Alert;Pleasant mood;Cooperative   Treatment Provided   Treatment provided  Cognitive-Linquistic   Pain Assessment   Pain Assessment 0-10   Pain Score 8    Pain Location lt shoulder  "I think it's from the A/C blowing on it last night"   Pain Descriptors / Indicators Nagging   Pain Intervention(s) Monitored during session   Cognitive-Linquistic Treatment   Treatment focused on Cognition   Skilled Treatment Attention tasks facilitated by SLP today to improve pt's independence with functional cognitive-linguistic tasks. Pt demonstrated sustained attention with simple written tasks for approx 45 seconds and then benefitted from SLP min-mod cues to remain focused on task. Pt had more success with initiating next question as task progressed, however actually beginning to answer question, SLP cues remained necessary.    Assessment / Recommendations / Plan   Plan Continue with current plan of care   Progression Toward Goals   Progression toward goals Not progressing toward goals (comment)  pt sustained attention seems worse today in written tasks            SLP Short Term Goals - 09/06/14 1028    SLP SHORT TERM GOAL #1   Title pt will attend to 10 minute cognitive communication task in a mod distracting environment with rare min A back to task   Status Not Met   SLP SHORT TERM GOAL #2   Title pt  will complete simple verbal time, money, and math problems 75% success and rare min A   Status Not Met   SLP SHORT TERM GOAL #3   Title pt will demonstrate intellectual awareness to cognitive communicaiton deficits by telling SLP 3 non-physical deficits   Status Not Met          SLP Long Term Goals - 09/12/14 1148    SLP LONG TERM GOAL #1   Title pt will demo alternating attention in at least two cognitive communication tasks by having 85% success on each task with occasional min A   Time 4   Period Weeks   Status Revised  revised 09-05-14   SLP LONG TERM GOAL #2   Title pt will demonstrate emergent awareness in simple cognitive linguistic tasks by correcting  errors 80% of opportunities   Time 4   Period Weeks   Status Revised  revised 09-05-14   SLP LONG TERM GOAL #3   Title pt will give appropriate solutions verbally to functional problems 95% of the time   Time 4   Period Weeks   Status On-going   SLP LONG TERM GOAL #4   Title pt to use some kind of memory compensation system twice during a therapy session   Time 4   Period Weeks   Status On-going          Plan - 09/12/14 1143    Clinical Impression Statement Cont'd deficits in attention/impulsivity and awareness. Skilled ST needs to continue to maximize cognitive-linguistic skills for decr'd caregiver burden as well as incr'd independence.   Speech Therapy Frequency 2x / week   Duration 4 weeks  or 9 more visits   Treatment/Interventions Compensatory techniques;Functional tasks;Patient/family education;SLP instruction and feedback;Cognitive reorganization;Environmental controls;Internal/external aids   Potential to Achieve Goals Good   Potential Considerations Severity of impairments;Ability to learn/carryover information   Consulted and Agree with Plan of Care Patient        Problem List Patient Active Problem List   Diagnosis Date Noted  . Alterations of sensations following CVA (cerebrovascular accident) 09/10/2014  . Atrial fibrillation 09/06/2014  . Left spastic hemiparesis 08/07/2014  . Left-sided neglect 08/07/2014  . Right middle cerebral artery stroke 08/01/2014  . Tobacco dependence   . Carotid artery occlusion with infarction   . History of ETOH abuse   . Hyperlipidemia   . Essential hypertension, benign 10/11/2013    [facility] , [place], [place]   09/12/2014, 11:51 AM  [facility] [address] [place], [place], [address] Phone: [phone]   Fax:  [phone]

## 2014-09-17 ENCOUNTER — Ambulatory Visit: Payer: BLUE CROSS/BLUE SHIELD | Attending: Physical Medicine & Rehabilitation

## 2014-09-17 ENCOUNTER — Ambulatory Visit: Payer: BLUE CROSS/BLUE SHIELD | Admitting: Physical Therapy

## 2014-09-17 ENCOUNTER — Encounter: Payer: Self-pay | Admitting: Physical Therapy

## 2014-09-17 DIAGNOSIS — I69898 Other sequelae of other cerebrovascular disease: Secondary | ICD-10-CM | POA: Insufficient documentation

## 2014-09-17 DIAGNOSIS — R41840 Attention and concentration deficit: Secondary | ICD-10-CM | POA: Insufficient documentation

## 2014-09-17 DIAGNOSIS — I69859 Hemiplegia and hemiparesis following other cerebrovascular disease affecting unspecified side: Secondary | ICD-10-CM | POA: Insufficient documentation

## 2014-09-17 DIAGNOSIS — H539 Unspecified visual disturbance: Secondary | ICD-10-CM | POA: Insufficient documentation

## 2014-09-17 DIAGNOSIS — R41841 Cognitive communication deficit: Secondary | ICD-10-CM | POA: Diagnosis not present

## 2014-09-17 DIAGNOSIS — R4189 Other symptoms and signs involving cognitive functions and awareness: Secondary | ICD-10-CM | POA: Insufficient documentation

## 2014-09-17 DIAGNOSIS — R208 Other disturbances of skin sensation: Secondary | ICD-10-CM | POA: Diagnosis present

## 2014-09-17 NOTE — Therapy (Signed)
[facility] [address] [place], [place], [address] Phone: [phone]   Fax:  [phone]  Patient Details  Name: [name] MRN: [id number] Date of Birth: 12-18-1955 Referring Provider:  No ref. provider found  Encounter Date: 09/17/2014  PHYSICAL THERAPY DISCHARGE SUMMARY  Visits from Start of Care: 6  Current functional level related to goals / functional outcomes:  1030       PT SHORT TERM GOAL #1    Title  same as LTG's    PT LONG TERM GOAL #1    Title  Pt. will amb. 1000' on all surfaces independent- ly without LOB.    Time  3    Period  Weeks    Status  New    PT LONG TERM GOAL #2    Title  Negotiate 4 steps without use of rail using a step over step sequence to demo improved balance    Time  3    Period  Weeks    Status  New    PT LONG TERM GOAL #3    Title  Complete Functional gait assessment and establish goal as appropriate    Time  3    Period  Weeks    Status  New    PT LONG TERM GOAL #4    Title  Independent in HEP for L hamstring and plantarflexor strengthening    Time  3    Period  Weeks    Status  New        09/11/14 1022    PT LONG TERM GOAL #1    Title  Pt. will amb. 1000' on all surfaces independent- ly without LOB.    Time  3    Period  Weeks    Status  Achieved    PT LONG TERM GOAL #2    Title  Negotiate 4 steps without use of rail using a step over step sequence to demo improved balance    Time  3    Period  Weeks    Status  Achieved    PT LONG TERM GOAL #3    Title  Complete Functional gait assessment and establish goal as appropriate    Time  3    Period  Weeks    Status  New    PT LONG TERM GOAL #4    Title  Independent in HEP for L hamstring and plantarflexor strengthening    Time  3    Period  Weeks    Status  On-going             Remaining deficits: Balance,  gait and mobility are WNL's; pt. Continues to receive OT services to address LUE deficits   Education / Equipment: No equipment needed in regards to PT; pt was instructed in a HEP for strengthening exercises.  Plan: Patient agrees to discharge.  Patient goals were met. Patient is being discharged due to meeting the stated rehab goals.  ?????       EHMCNO, BSJGG EZMOQHU, PT 09/17/2014, 1:25 PM  [facility] [address] [place], [place], [address] Phone: [phone]   Fax:  [phone]

## 2014-09-17 NOTE — Therapy (Signed)
[facility] [address] [place], [place], [address] Phone: [phone]   Fax:  [phone]  Speech Language Pathology Treatment  Patient Details  Name: [name] MRN: [id number] Date of Birth: 04-27-1955 Referring Provider:  Barton Fanny, MD  Encounter Date: 09/17/2014      End of Session - 09/17/14 1101    Visit Number 9   Number of Visits 16   Date for SLP Re-Evaluation 10/15/14   Authorization - Visit Number 8   Authorization - Number of Visits 30   SLP Start Time 8937   SLP Stop Time  1102   SLP Time Calculation (min) 39 min   Activity Tolerance Patient tolerated treatment well      Past Medical History  Diagnosis Date  . Arthritis   . Hypertension   . Hyperlipidemia   . Stroke     a. s/p MDT ILR implant   . Cor triatriatum     a. identified on TEE at time of stroke  . Paroxysmal atrial fibrillation     a. identified on LINQ 08/2014    Past Surgical History  Procedure Laterality Date  . Loop recorder implant  07/27/14    LOOP REVEAL LINQ [id number] - [id number]  . Tee without cardioversion N/A 07/27/2014    Procedure: TRANSESOPHAGEAL ECHOCARDIOGRAM (TEE);  Surgeon: Pixie Casino, MD;  Location: [facility];  Service: Cardiovascular;  Laterality: N/A;  . Ep implantable device N/A 07/27/2014    Procedure: Loop Recorder Insertion;  Surgeon: Evans Lance, MD;  Location: [facility];  Service: Cardiovascular;  Laterality: N/A;    There were no vitals filed for this visit.  Visit Diagnosis: Cognitive communication deficit      Subjective Assessment - 09/17/14 1027    Subjective Pt arrived alone to tx today.                ADULT SLP TREATMENT - 09/17/14 1056    General Information   Behavior/Cognition Alert;Pleasant mood;Cooperative   Treatment Provided   Treatment provided Cognitive-Linquistic   Pain Assessment   Pain Assessment No/denies pain   Cognitive-Linquistic  Treatment   Treatment focused on Cognition   Skilled Treatment Attention tasks were targeted by SLP today in guiding pt through his homework and through calendar/schedule tasks.  Pt maintained sustained/selective attention with SLP verbal cues usually, and occasional min verbal and demo cues for lt neglect.   Assessment / Recommendations / Plan   Plan Continue with current plan of care   Progression Toward Goals   Progression toward goals --  limited progress seen today, compared to last week          SLP Education - 09/17/14 1101    Education Details Cognitive deficits   Person(s) Educated Patient   Methods Explanation   Comprehension Verbalized understanding;Need further instruction          SLP Short Term Goals - 09/17/14 1249    SLP SHORT TERM GOAL #1   Title pt will attend to 10 minute cognitive communication task in a mod distracting environment with rare min A back to task   Status Not Met   SLP SHORT TERM GOAL #2   Title pt will complete simple verbal time, money, and math problems 75% success and rare min A   Status Not Met   SLP SHORT TERM GOAL #3   Title pt will demonstrate intellectual awareness to cognitive communicaiton deficits by telling SLP 3 non-physical deficits  Status Not Met          SLP Long Term Goals - 09/17/14 1249    SLP LONG TERM GOAL #1   Title pt will demo alternating attention in a simple cognitive communication task (looking at a chart and answering question) by having 85% success on the task with rare min A   Time 3   Period Weeks   Status Revised  revised 09-05-14   SLP LONG TERM GOAL #2   Title pt will demonstrate emergent awareness in simple cognitive linguistic tasks by correcting errors 60% of opportunities   Time 3   Period Weeks   Status Revised  revised 09-05-14   SLP LONG TERM GOAL #3   Title pt will give appropriate solutions verbally to functional problems 95% of the time   Time 3   Period Weeks   Status On-going   SLP  LONG TERM GOAL #4   Title pt to use some kind of memory compensation system twice during a therapy session   Time 3   Period Weeks   Status On-going          Plan - 09/17/14 1101    Clinical Impression Statement Cont'd deficits in attention/impulsivity and awareness. Skilled ST needs to continue to maximize cognitive-linguistic skills for decr'd caregiver burden as well as incr'd independence.   Speech Therapy Frequency 2x / week   Duration --  3 weeks   Treatment/Interventions Compensatory techniques;Functional tasks;Patient/family education;SLP instruction and feedback;Cognitive reorganization;Environmental controls;Internal/external aids   Potential to Achieve Goals Good   Potential Considerations Severity of impairments;Ability to learn/carryover information        Problem List Patient Active Problem List   Diagnosis Date Noted  . Alterations of sensations following CVA (cerebrovascular accident) 09/10/2014  . Atrial fibrillation 09/06/2014  . Left spastic hemiparesis 08/07/2014  . Left-sided neglect 08/07/2014  . Right middle cerebral artery stroke 08/01/2014  . Tobacco dependence   . Carotid artery occlusion with infarction   . History of ETOH abuse   . Hyperlipidemia   . Essential hypertension, benign 10/11/2013    [facility] , [place], [place]  09/17/2014, 12:51 PM  [facility] [address] [place], [place], [address] Phone: [phone]   Fax:  [phone]

## 2014-09-17 NOTE — Patient Instructions (Signed)
  Please complete the assigned speech therapy homework and return it to your next session.  

## 2014-09-19 ENCOUNTER — Ambulatory Visit: Payer: BLUE CROSS/BLUE SHIELD | Admitting: Occupational Therapy

## 2014-09-19 ENCOUNTER — Encounter: Payer: Self-pay | Admitting: Occupational Therapy

## 2014-09-19 DIAGNOSIS — R41841 Cognitive communication deficit: Secondary | ICD-10-CM | POA: Diagnosis not present

## 2014-09-19 DIAGNOSIS — R4189 Other symptoms and signs involving cognitive functions and awareness: Secondary | ICD-10-CM

## 2014-09-19 DIAGNOSIS — I69359 Hemiplegia and hemiparesis following cerebral infarction affecting unspecified side: Secondary | ICD-10-CM

## 2014-09-19 DIAGNOSIS — I69998 Other sequelae following unspecified cerebrovascular disease: Secondary | ICD-10-CM

## 2014-09-19 DIAGNOSIS — R209 Unspecified disturbances of skin sensation: Secondary | ICD-10-CM

## 2014-09-19 DIAGNOSIS — H539 Unspecified visual disturbance: Secondary | ICD-10-CM

## 2014-09-19 NOTE — Therapy (Signed)
[facility] [address] [place], [place], [address] Phone: [phone]   Fax:  [phone]  Occupational Therapy Treatment  Patient Details  Name: [name] MRN: [id number] Date of Birth: [birth year] Referring Provider:  Barton Fanny, MD  Encounter Date: 09/19/2014      OT End of Session - 09/19/14 1637    Visit Number 12   Number of Visits 17   Date for OT Re-Evaluation 10/10/14   Authorization Type BCBS   Authorization - Visit Number 12   Authorization - Number of Visits 30   OT Start Time 4496   OT Stop Time 1615   OT Time Calculation (min) 45 min   Activity Tolerance Patient tolerated treatment well   Behavior During Therapy [facility] for tasks assessed/performed      Past Medical History  Diagnosis Date  . Arthritis   . Hypertension   . Hyperlipidemia   . Stroke     a. s/p MDT ILR implant   . Cor triatriatum     a. identified on TEE at time of stroke  . Paroxysmal atrial fibrillation     a. identified on LINQ 08/2014    Past Surgical History  Procedure Laterality Date  . Loop recorder implant  07/27/14    LOOP REVEAL LINQ PRF16 - [id number]  . Tee without cardioversion N/A 07/27/2014    Procedure: TRANSESOPHAGEAL ECHOCARDIOGRAM (TEE);  Surgeon: Pixie Casino, MD;  Location: [facility];  Service: Cardiovascular;  Laterality: N/A;  . Ep implantable device N/A 07/27/2014    Procedure: Loop Recorder Insertion;  Surgeon: Evans Lance, MD;  Location: [facility];  Service: Cardiovascular;  Laterality: N/A;    There were no vitals filed for this visit.  Visit Diagnosis:  Impaired cognition  Visual disturbance  Sensation alteration, late effect of cerebrovascular disease  Hemiparesis affecting nondominant side as late effect of cerebrovascular accident      Subjective Assessment - 09/19/14 1619    Subjective  I should have been practicing tying my shoes.  That's still hard.   Pertinent History see snapshot   Patient Stated Goals return to gardening, independence with self care.   Currently in Pain? No/denies   Pain Score 0-No pain                      OT Treatments/Exercises (OP) - 09/19/14 0001    ADLs   UB Dressing Patient able to doff and don tshirt today without cueing, and without any physical assistance.  Slight increased time noted, but very reasonable.  Patient reports his wife still "hovers" when he wants to do things by himself.  reinforced that wife is trying to ensure safety, reduce frustration, and ensure completeness.      LB Dressing Patient still reports having difficulty putting his legs into pant legs correctly, often experiencing two legs in same hole of pants.  Patient reports wife is quick to help him.  Explained to patient the importance of mindful practice with familiar tasks without distractions,and with simple cueing as preparation.  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  Patient able to slice a tomato today with close supervision and frequent cueing for left hand maangement.  Patient able  to sustain pressure on tomato with left hand with frequent cues to do so.  Patient did visually attend to task, and when knife came in contact with left hand - he did not feel this contact, but he did see it, and was able to make adjustments with verbal prompting.  Task was completed in a quiet environment with minimal visual / auditory distractions.     Driving Patient very eager to return to driving.  He was able to express concern over not being able to maintain left hand on wheel.  Reiterated for patient that this therapist's biggest concerns were his significant visual impairment, as well as decreased sustained attention, and  increased distractibitlity.  Expalined that these two factors would not allow him to safely return to driving soon.     Leisure Patient is an avid gardener.  Worked with patient on long handled tool - rake on level surface.  Patient needed verbal cueing (frequent) for left hand palcement onto rake, but once there able to effectively rake level surface using two hands.                  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) and returning to driving - neither recommended at this point   Person(s) Educated Patient   Methods Explanation   Comprehension Verbal cues required          OT Short Term Goals - 09/12/14 1349    OT SHORT TERM GOAL #1   Title Patient will independently don socks   Baseline Pt reports he is not performing, encouraged pt to perform at home-09/11/14   Time 4   Period Weeks   Status Partially Met   OT SHORT TERM GOAL #2   Title Patient will demonstrate sufficient pincer grasp to tighten shoelace with left hand   Baseline Pt is unable to perform consistently   Time 4   Status Achieved   OT SHORT TERM GOAL #3   Title Patient will visually attend to activity presented in midline for 10 minutes in minimally distracting environment   Period Weeks   Status Achieved   OT SHORT TERM GOAL #5   Title Patient will prepare a simple cold snack following 2 step directives in minimally distracting environment   Baseline min v.c., pt left water running   Status Partially Met           OT Long Term Goals - 09/19/14 1640    OT LONG TERM GOAL #1   Title Patient will dress himself with modified independence   Status On-going   OT LONG TERM GOAL #2   Title Patient will shower himself, including transfer into and out of tub with modified independence   Status On-going   OT LONG TERM GOAL #3   Title Patient will complete grooming tasks independently, using visula cues for  thoroughness (eg - shaving using mirror for guidance)   Status Achieved   OT LONG TERM GOAL #4   Title Patient will effectively use long handled gardeing tool to hoe/rake garden in standing using bilateral upper extremities   Status On-going   OT LONG TERM GOAL #5   Title Patient will demonstrate safe use of knife for cutting food at a meal, or during meal preparation.  9This goal requires effective use of two hands in this task)   Status On-going  Plan - 09/19/14 1637    Clinical Impression Statement Patient is showing slight improvement in awareness of deficits, and this is beginning to carryover into improved performace in ADL/IADL.  Patient is progressing toward long term goals   Pt will benefit from skilled therapeutic intervention in order to improve on the following deficits (Retired) Decreased balance;Decreased cognition;Decreased coordination;Decreased endurance;Decreased knowledge of precautions;Decreased knowledge of use of DME;Decreased range of motion;Decreased safety awareness;Impaired perceived functional ability;Increased edema;Decreased strength;Impaired sensation;Impaired tone;Impaired UE functional use;Impaired vision/preception   Rehab Potential Good   OT Frequency 2x / week   OT Duration 8 weeks   OT Treatment/Interventions Self-care/ADL training;Electrical Stimulation;Therapeutic exercise;Neuromuscular education;DME and/or AE instruction;Manual Therapy;Functional Mobility Training;Splinting;Therapeutic exercises;Therapeutic activities;Cognitive remediation/compensation;Visual/perceptual remediation/compensation;Patient/family education;Balance training   Plan Reinforce ADL/IADL - He should have practiced tying shoes, donning shirt and shorts.  Does best in quiet environment with overt cues as to rationale for activity   OT Home Exercise Plan issued 09/06/14, simple functional use of LUE, added home activity to incorporate more particiaption in ADL    Consulted and Agree with Plan of Care Patient        Problem List Patient Active Problem List   Diagnosis Date Noted  . Alterations of sensations following CVA (cerebrovascular accident) 09/10/2014  . Atrial fibrillation 09/06/2014  . Left spastic hemiparesis 08/07/2014  . Left-sided neglect 08/07/2014  . Right middle cerebral artery stroke 08/01/2014  . Tobacco dependence   . Carotid artery occlusion with infarction   . History of ETOH abuse   . Hyperlipidemia   . Essential hypertension, benign 10/11/2013    Mariah Milling, OTR/L 09/19/2014, 4:41 PM  [facility] [address] [place], [place], [address] Phone: [phone]   Fax:  [phone]

## 2014-09-20 ENCOUNTER — Ambulatory Visit: Payer: BLUE CROSS/BLUE SHIELD

## 2014-09-20 ENCOUNTER — Ambulatory Visit: Payer: BLUE CROSS/BLUE SHIELD | Admitting: Occupational Therapy

## 2014-09-20 ENCOUNTER — Ambulatory Visit: Payer: BLUE CROSS/BLUE SHIELD | Admitting: Physical Therapy

## 2014-09-20 ENCOUNTER — Encounter: Payer: Self-pay | Admitting: Occupational Therapy

## 2014-09-20 DIAGNOSIS — R41841 Cognitive communication deficit: Secondary | ICD-10-CM

## 2014-09-20 DIAGNOSIS — R4189 Other symptoms and signs involving cognitive functions and awareness: Secondary | ICD-10-CM

## 2014-09-20 DIAGNOSIS — I69359 Hemiplegia and hemiparesis following cerebral infarction affecting unspecified side: Secondary | ICD-10-CM

## 2014-09-20 DIAGNOSIS — I69998 Other sequelae following unspecified cerebrovascular disease: Secondary | ICD-10-CM

## 2014-09-20 DIAGNOSIS — H539 Unspecified visual disturbance: Secondary | ICD-10-CM

## 2014-09-20 DIAGNOSIS — R209 Unspecified disturbances of skin sensation: Secondary | ICD-10-CM

## 2014-09-20 NOTE — Patient Instructions (Signed)
  Please complete the assigned speech therapy homework and return it to your next session.  

## 2014-09-20 NOTE — Therapy (Signed)
[facility] [address] [place], [place], [address] Phone: [phone]   Fax:  [phone]  Occupational Therapy Treatment  Patient Details  Name: [name] MRN: [id number] Date of Birth: [birth year] Referring Provider:  Barton Fanny, MD  Encounter Date: 09/20/2014      OT End of Session - 09/20/14 1214    Visit Number 13   Number of Visits 17   Date for OT Re-Evaluation 10/10/14   Authorization Type BCBS   OT Start Time 1016   OT Stop Time 1100   OT Time Calculation (min) 44 min   Activity Tolerance Patient tolerated treatment well      Past Medical History  Diagnosis Date  . Arthritis   . Hypertension   . Hyperlipidemia   . Stroke     a. s/p MDT ILR implant   . Cor triatriatum     a. identified on TEE at time of stroke  . Paroxysmal atrial fibrillation     a. identified on LINQ 08/2014    Past Surgical History  Procedure Laterality Date  . Loop recorder implant  07/27/14    LOOP REVEAL LINQ VXB93 - [id number]  . Tee without cardioversion N/A 07/27/2014    Procedure: TRANSESOPHAGEAL ECHOCARDIOGRAM (TEE);  Surgeon: Pixie Casino, MD;  Location: [facility];  Service: Cardiovascular;  Laterality: N/A;  . Ep implantable device N/A 07/27/2014    Procedure: Loop Recorder Insertion;  Surgeon: Evans Lance, MD;  Location: [facility];  Service: Cardiovascular;  Laterality: N/A;    There were no vitals filed for this visit.  Visit Diagnosis:  Visual disturbance  Impaired cognition  Sensation alteration, late effect of cerebrovascular disease  Hemiparesis affecting nondominant side as late effect of cerebrovascular accident      Subjective Assessment - 09/20/14 1019    Subjective  I got my pants on by myself this  morning   Pertinent History see snapshot   Patient Stated Goals return to gardening, independence with self care.   Currently in Pain? No/denies                       OT Treatments/Exercises (OP) - 09/20/14 0001    ADLs   LB Dressing Pt reports he donned pants independently for the first time today.   Visual/Perceptual Exercises   Scanning Environmental;Tabletop   Other Exercises Environmental as well as table top scanning activities with emsphasis on attendign to the L, organized scanning strategies, self monitoring for mistakes. Pt with 90% accuracy for table top and 80% accuracy with environmental scanning.     Neurological Re-education Exercises   Other Exercises 1 Neuro re ed to address incoporation of LUE into bilateral functional tasks (managing screws, raking). Pt needed no cueing today for using rake however more fine activities remain very difficult.                 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) and returning to driving - neither recommended at this point   Person(s) Educated Patient   Methods Explanation   Comprehension Verbal cues required          OT Short Term Goals - 09/20/14 1212    OT SHORT TERM GOAL #1   Title Patient will independently don socks   Baseline Pt reports he is not performing, encouraged pt to perform at  home-09/11/14   Time 4   Period Weeks   Status Partially Met   OT SHORT TERM GOAL #2   Title Patient will demonstrate sufficient pincer grasp to tighten shoelace with left hand   Baseline Pt is unable to perform consistently   Time 4   Status Achieved   OT SHORT TERM GOAL #3   Title Patient will visually attend to activity presented in midline for 10 minutes in minimally distracting environment   Period Weeks   Status Achieved   OT SHORT TERM GOAL #5   Title Patient will prepare a simple cold snack following 2 step directives in minimally distracting environment   Baseline min v.c., pt left water running   Status Partially Met           OT Long Term Goals -  09/20/14 1212    OT LONG TERM GOAL #1   Title Patient will dress himself with modified independence   Status On-going   OT LONG TERM GOAL #2   Title Patient will shower himself, including transfer into and out of tub with modified independence   Status On-going   OT LONG TERM GOAL #3   Title Patient will complete grooming tasks independently, using visula cues for thoroughness (eg - shaving using mirror for guidance)   Status Achieved   OT LONG TERM GOAL #4   Title Patient will effectively use long handled gardeing tool to hoe/rake garden in standing using bilateral upper extremities   Status On-going   OT LONG TERM GOAL #5   Title Patient will demonstrate safe use of knife for cutting food at a meal, or during meal preparation.  9This goal requires effective use of two hands in this task)   Status On-going               Plan - 09/20/14 1212    Clinical Impression Statement Pt demonstrating carry over of strategies for self monitoring, attending to the left and using vision to compensate for decreased sensation in LUE.   Pt will benefit from skilled therapeutic intervention in order to improve on the following deficits (Retired) Decreased balance;Decreased cognition;Decreased coordination;Decreased endurance;Decreased knowledge of precautions;Decreased knowledge of use of DME;Decreased range of motion;Decreased safety awareness;Impaired perceived functional ability;Increased edema;Decreased strength;Impaired sensation;Impaired tone;Impaired UE functional use;Impaired vision/preception   Rehab Potential Good   OT Frequency 2x / week   OT Duration 8 weeks   OT Treatment/Interventions Self-care/ADL training;Electrical Stimulation;Therapeutic exercise;Neuromuscular education;DME and/or AE instruction;Manual Therapy;Functional Mobility Training;Splinting;Therapeutic exercises;Therapeutic activities;Cognitive remediation/compensation;Visual/perceptual remediation/compensation;Patient/family  education;Balance training   Plan Reinforce ADL for shoe tying and shower transfers, focus on functional use LUE, evironmental scanning.   OT Home Exercise Plan issued 09/06/14, simple functional use of LUE, added home activity to incorporate more particiaption in ADL   Consulted and Agree with Plan of Care Patient        Problem List Patient Active Problem List   Diagnosis Date Noted  . Alterations of sensations following CVA (cerebrovascular accident) 09/10/2014  . Atrial fibrillation 09/06/2014  . Left spastic hemiparesis 08/07/2014  . Left-sided neglect 08/07/2014  . Right middle cerebral artery stroke 08/01/2014  . Tobacco dependence   . Carotid artery occlusion with infarction   . History of ETOH abuse   . Hyperlipidemia   . Essential hypertension, benign 10/11/2013    Quay Burow, OTR/L 09/20/2014, 12:15 PM  [facility] [address] [place], [place], [address] Phone: [phone]   Fax:  [phone]

## 2014-09-20 NOTE — Therapy (Signed)
[facility] [address] [place], [place], [address] Phone: [phone]   Fax:  [phone]  Speech Language Pathology Treatment  Patient Details  Name: Renardo Cheatum MRN: [id number] Date of Birth: [birth year] Referring Provider:  Barton Fanny, MD  Encounter Date: 09/20/2014      End of Session - 09/20/14 1233    Visit Number 10   Number of Visits 16   Date for SLP Re-Evaluation 10/15/14   Authorization - Visit Number 9   Authorization - Number of Visits 30   SLP Start Time 1103   SLP Stop Time  1145   SLP Time Calculation (min) 42 min   Activity Tolerance Patient tolerated treatment well      Past Medical History  Diagnosis Date  . Arthritis   . Hypertension   . Hyperlipidemia   . Stroke     a. s/p MDT ILR implant   . Cor triatriatum     a. identified on TEE at time of stroke  . Paroxysmal atrial fibrillation     a. identified on LINQ 08/2014    Past Surgical History  Procedure Laterality Date  . Loop recorder implant  07/27/14    LOOP REVEAL LINQ AUQ33 - [id number]  . Tee without cardioversion N/A 07/27/2014    Procedure: TRANSESOPHAGEAL ECHOCARDIOGRAM (TEE);  Surgeon: Pixie Casino, MD;  Location: [facility];  Service: Cardiovascular;  Laterality: N/A;  . Ep implantable device N/A 07/27/2014    Procedure: Loop Recorder Insertion;  Surgeon: Evans Lance, MD;  Location: [facility];  Service: Cardiovascular;  Laterality: N/A;    There were no vitals filed for this visit.  Visit Diagnosis: Cognitive communication deficit      Subjective Assessment - 09/20/14 1125    Subjective Pt arrived alone to tx today. Pt's dysarthria is more prnounced today than in the past. Pt denies other acute s/s of CVA.               ADULT SLP TREATMENT - 09/20/14 1105    General Information   Behavior/Cognition Alert;Pleasant mood;Cooperative   Treatment Provided   Treatment provided  Cognitive-Linquistic   Pain Assessment   Pain Assessment No/denies pain   Cognitive-Linquistic Treatment   Treatment focused on Cognition   Skilled Treatment Pt sustained and alternating attention skills were targeted by SLP in therapy today by following written directions. Pt req'd redirection back to task by SLP four times during tasks today. Pt with reduced problem solving/reasoning skills in how to change responses to correct them. Emergent awareness demo'd as impaired as error awareness at 20% without SLP usual  verbal, visual cues.    Assessment / Recommendations / Plan   Plan Continue with current plan of care   Progression Toward Goals   Progression toward goals --  limited progress            SLP Short Term Goals - 09/17/14 1249    SLP SHORT TERM GOAL #1   Title pt will attend to 10 minute cognitive communication task in a mod distracting environment with rare min A back to task   Status Not Met   SLP SHORT TERM GOAL #2   Title pt will complete simple verbal time, money, and math problems 75% success and rare min A   Status Not Met   SLP SHORT TERM GOAL #3   Title pt will demonstrate intellectual awareness to cognitive communicaiton deficits by telling SLP 3 non-physical deficits  Status Not Met          SLP Long Term Goals - 09/20/14 1240    SLP LONG TERM GOAL #1   Title pt will demo alternating attention in a simple cognitive communication task (looking at a chart and answering question) by having 85% success on the task with rare min A   Time 3   Period Weeks   Status Revised  revised 09-05-14   SLP LONG TERM GOAL #2   Title pt will demonstrate emergent awareness in simple cognitive linguistic tasks by correcting errors 60% of opportunities   Time 3   Period Weeks   Status Revised  revised 09-05-14   SLP LONG TERM GOAL #3   Title pt will give appropriate solutions verbally to functional problems 95% of the time   Time 3   Period Weeks   Status On-going    SLP LONG TERM GOAL #4   Title pt to use some kind of memory compensation system twice during a therapy session   Time 3   Period Weeks   Status On-going          Plan - 09/20/14 1234    Clinical Impression Statement Cont'd deficits in attention/impulsivity and awareness. Difficulites in problem solving noted today as well. Skilled ST needs to continue to maximize cognitive-linguistic skills for decr'd caregiver burden as well as incr'd independence.   Speech Therapy Frequency 2x / week   Duration --  3 weeks   Treatment/Interventions Compensatory techniques;Functional tasks;Patient/family education;SLP instruction and feedback;Cognitive reorganization;Environmental controls;Internal/external aids   Potential to Achieve Goals Good   Potential Considerations Severity of impairments;Ability to learn/carryover information        Problem List Patient Active Problem List   Diagnosis Date Noted  . Alterations of sensations following CVA (cerebrovascular accident) 09/10/2014  . Atrial fibrillation 09/06/2014  . Left spastic hemiparesis 08/07/2014  . Left-sided neglect 08/07/2014  . Right middle cerebral artery stroke 08/01/2014  . Tobacco dependence   . Carotid artery occlusion with infarction   . History of ETOH abuse   . Hyperlipidemia   . Essential hypertension, benign 10/11/2013    [facility] , [place], [place]  09/20/2014, 12:42 PM  [facility] [address] [place], [place], [address] Phone: [phone]   Fax:  [phone]

## 2014-09-21 ENCOUNTER — Telehealth: Payer: Self-pay | Admitting: *Deleted

## 2014-09-21 NOTE — Telephone Encounter (Signed)
[name] called reporting something about his "heart machine".  I assume it is the loop recorder.  I told her we do not manage his cardiac status and she will need to call Dr Reece Leader office.  I gave her the number.

## 2014-09-24 LAB — CUP PACEART REMOTE DEVICE CHECK
Date Time Interrogation Session: 20160711162724
MDC IDC SET ZONE DETECTION INTERVAL: 2000 ms
MDC IDC SET ZONE DETECTION INTERVAL: 3000 ms
Zone Setting Detection Interval: 350 ms

## 2014-09-25 ENCOUNTER — Encounter: Payer: Self-pay | Admitting: Occupational Therapy

## 2014-09-25 ENCOUNTER — Ambulatory Visit: Payer: BLUE CROSS/BLUE SHIELD | Admitting: Occupational Therapy

## 2014-09-25 ENCOUNTER — Ambulatory Visit: Payer: BLUE CROSS/BLUE SHIELD

## 2014-09-25 ENCOUNTER — Ambulatory Visit: Payer: BLUE CROSS/BLUE SHIELD | Admitting: Physical Therapy

## 2014-09-25 ENCOUNTER — Other Ambulatory Visit: Payer: Self-pay

## 2014-09-25 ENCOUNTER — Ambulatory Visit (INDEPENDENT_AMBULATORY_CARE_PROVIDER_SITE_OTHER): Payer: BLUE CROSS/BLUE SHIELD | Admitting: *Deleted

## 2014-09-25 VITALS — BP 121/70 | HR 70

## 2014-09-25 DIAGNOSIS — R41841 Cognitive communication deficit: Secondary | ICD-10-CM

## 2014-09-25 DIAGNOSIS — R41840 Attention and concentration deficit: Secondary | ICD-10-CM

## 2014-09-25 DIAGNOSIS — I69998 Other sequelae following unspecified cerebrovascular disease: Secondary | ICD-10-CM

## 2014-09-25 DIAGNOSIS — I639 Cerebral infarction, unspecified: Secondary | ICD-10-CM

## 2014-09-25 DIAGNOSIS — I69359 Hemiplegia and hemiparesis following cerebral infarction affecting unspecified side: Secondary | ICD-10-CM

## 2014-09-25 DIAGNOSIS — E782 Mixed hyperlipidemia: Secondary | ICD-10-CM

## 2014-09-25 DIAGNOSIS — H539 Unspecified visual disturbance: Secondary | ICD-10-CM

## 2014-09-25 DIAGNOSIS — R209 Unspecified disturbances of skin sensation: Principal | ICD-10-CM

## 2014-09-25 MED ORDER — ATORVASTATIN CALCIUM 80 MG PO TABS: ORAL_TABLET | ORAL | Status: DC

## 2014-09-25 NOTE — Therapy (Signed)
[facility] [address] [place], [place], [address] Phone: [phone]   Fax:  [phone]  Speech Language Pathology Treatment  Patient Details  Name: [name] MRN: [id number] Date of Birth: May 21, 1955 Referring Provider:  Barton Fanny, MD  Encounter Date: 09/25/2014      End of Session - 09/25/14 1432    Visit Number 11   Number of Visits 16   Date for SLP Re-Evaluation 10/15/14   Authorization - Visit Number 10   Authorization - Number of Visits 30   SLP Start Time 3875   SLP Stop Time  6433   SLP Time Calculation (min) 43 min   Activity Tolerance Patient tolerated treatment well      Past Medical History  Diagnosis Date  . Arthritis   . Hypertension   . Hyperlipidemia   . Stroke     a. s/p MDT ILR implant   . Cor triatriatum     a. identified on TEE at time of stroke  . Paroxysmal atrial fibrillation     a. identified on LINQ 08/2014    Past Surgical History  Procedure Laterality Date  . Loop recorder implant  07/27/14    LOOP REVEAL LINQ [id number] - [id number]  . Tee without cardioversion N/A 07/27/2014    Procedure: TRANSESOPHAGEAL ECHOCARDIOGRAM (TEE);  Surgeon: Pixie Casino, MD;  Location: [facility];  Service: Cardiovascular;  Laterality: N/A;  . Ep implantable device N/A 07/27/2014    Procedure: Loop Recorder Insertion;  Surgeon: Evans Lance, MD;  Location: [facility];  Service: Cardiovascular;  Laterality: N/A;    There were no vitals filed for this visit.  Visit Diagnosis: Cognitive communication deficit      Subjective Assessment - 09/25/14 1401    Subjective "I browsed at it." (re: SLP's question about pt checking homework)               ADULT SLP TREATMENT - 09/25/14 1429    General Information   Behavior/Cognition Alert;Pleasant mood;Cooperative   Treatment Provided   Treatment provided Cognitive-Linquistic   Pain Assessment   Pain Assessment  No/denies pain   Cognitive-Linquistic Treatment   Treatment focused on Cognition   Skilled Treatment SLP targeted pt's attention skills by correcting his homework with him. Pt emergent awareness 50% with homework, and limited emergent awareness with calendar task/therapy schedule. Pt with reduced problem solving skills seen in trying to figure out why the therapy time in each discipline did not match pt's written schedule (pt looking at wrong day). Pt worked for approx 10 minutes prior to requiring cues for staying on task. Minimal alternating attention seen in simple written diagram/question task.   Assessment / Recommendations / Plan   Plan Continue with current plan of care   Progression Toward Goals   Progression toward goals Progressing toward goals  some minimal progress in sustained attention            SLP Short Term Goals - 09/17/14 1249    SLP SHORT TERM GOAL #1   Title pt will attend to 10 minute cognitive communication task in a mod distracting environment with rare min A back to task   Status Not Met   SLP SHORT TERM GOAL #2   Title pt will complete simple verbal time, money, and math problems 75% success and rare min A   Status Not Met   SLP SHORT TERM GOAL #3   Title pt will demonstrate intellectual awareness to  cognitive communicaiton deficits by telling SLP 3 non-physical deficits   Status Not Met          SLP Long Term Goals - 09/25/14 1447    SLP LONG TERM GOAL #1   Title pt will demo alternating attention in a simple cognitive communication task (looking at a chart and answering question) by having 85% success on the task with rare min A   Time 2   Period Weeks   Status Revised  revised 09-05-14   SLP LONG TERM GOAL #2   Title pt will demonstrate emergent awareness in simple cognitive linguistic tasks by correcting errors 60% of opportunities   Time 2   Period Weeks   Status Revised  revised 09-05-14   SLP LONG TERM GOAL #3   Title pt will give  appropriate solutions verbally to functional problems 95% of the time   Time 2   Period Weeks   Status On-going   SLP LONG TERM GOAL #4   Title pt to use some kind of memory compensation system twice during a therapy session   Time 2   Period Weeks   Status On-going          Plan - 09/25/14 1444    Clinical Impression Statement Pt with approx 10 minutes of focused work until cues needed back to task. Limited progress with emergent and alternating attention. Skilled ST remains needed to cont to work wit hhpt to maximize cognitive linguistic ability to redue caregiver burden and incr independence.   Speech Therapy Frequency 2x / week   Duration 2 weeks  3 weeks   Treatment/Interventions Compensatory techniques;Functional tasks;Patient/family education;SLP instruction and feedback;Cognitive reorganization;Environmental controls;Internal/external aids   Potential to Achieve Goals Good   Potential Considerations Severity of impairments;Ability to learn/carryover information        Problem List Patient Active Problem List   Diagnosis Date Noted  . Alterations of sensations following CVA (cerebrovascular accident) 09/10/2014  . Atrial fibrillation 09/06/2014  . Left spastic hemiparesis 08/07/2014  . Left-sided neglect 08/07/2014  . Right middle cerebral artery stroke 08/01/2014  . Tobacco dependence   . Carotid artery occlusion with infarction   . History of ETOH abuse   . Hyperlipidemia   . Essential hypertension, benign 10/11/2013    [facility] , [place], [place]  09/25/2014, 2:48 PM  [facility] [address] [place], [place], [address] Phone: [phone]   Fax:  [phone]

## 2014-09-25 NOTE — Patient Instructions (Signed)
  Please complete the assigned speech therapy homework and return it to your next session.  

## 2014-09-25 NOTE — Therapy (Signed)
[facility] [address] [place], [place], [address] Phone: [phone]   Fax:  [phone]  Occupational Therapy Treatment  Patient Details  Name: [name] MRN: [id number] Date of Birth: [birth year] Referring Provider:  Barton Fanny, MD  Encounter Date: 09/25/2014      OT End of Session - 09/25/14 1726    Visit Number 14   Number of Visits 17   Date for OT Re-Evaluation 10/10/14   Authorization Type [employer]   Authorization - Visit Number 14   Authorization - Number of Visits 30   OT Start Time 5929   OT Stop Time 1530   OT Time Calculation (min) 43 min   Activity Tolerance Patient tolerated treatment well   Behavior During Therapy WFL for tasks assessed/performed      Past Medical History  Diagnosis Date  . Arthritis   . Hypertension   . Hyperlipidemia   . Stroke     a. s/p MDT ILR implant   . Cor triatriatum     a. identified on TEE at time of stroke  . Paroxysmal atrial fibrillation     a. identified on LINQ 08/2014    Past Surgical History  Procedure Laterality Date  . Loop recorder implant  07/27/14    LOOP REVEAL LINQ [id number] - [id number]  . Tee without cardioversion N/A 07/27/2014    Procedure: TRANSESOPHAGEAL ECHOCARDIOGRAM (TEE);  Surgeon: Pixie Casino, MD;  Location: [facility];  Service: Cardiovascular;  Laterality: N/A;  . Ep implantable device N/A 07/27/2014    Procedure: Loop Recorder Insertion;  Surgeon: Evans Lance, MD;  Location: [facility];  Service: Cardiovascular;  Laterality: N/A;    Filed Vitals:   09/25/14 1703  BP: 121/70  Pulse: 70    Visit Diagnosis:  Sensation alteration, late effect of cerebrovascular disease  Visual disturbance  Inattention  Hemiparesis affecting nondominant side as late effect of cerebrovascular accident  Cognitive communication deficit      Subjective Assessment - 09/25/14 1703    Subjective  I bought these shoes so I  wouldn't have to tie them.  I hate wearing shoes with laces.   Pertinent History see snapshot   Patient Stated Goals return to gardening, independence with self care.                      OT Treatments/Exercises (OP) - 09/25/14 0001    ADLs   Bathing Patient indicates that he can now bathe himself, wife still ensures he gets into and out of shower safely.  Need to discuss with wife - not available this session , that she needs to begin to withdraw support and assess patient's ability to perform simple self care tasks.  Patient reports feelings of wife hovering  and this makes him nervous.  Will need to discuss with wife.  Patient indicates wife is srying him after the shower.  Discussed that this was no longer necessary as patient is perfectly capable of drying himself, and frustrated with this level of oversight.   Driving Patient again indicating a strong desire to drive.  Again had a very overt discussion regarding the tremendous safety risk he presents for himself and for others as he does not attend to the left side of his body or environment.  In the moment, patient able to state that he was not interested in hurting someone else by driving, yet this issue is very sensitive for him.  Discussed  that even at the end of this course of OT, that it would be highly unllikely that he would be recommended to drive.     Cognitive Exercises   [name] Cognitive Exercises 1 Patient showing signs of increased awareness. Patient shared a discussion he had last week with his SLP.  Patient was concerned as SLP shared that he had more pronounced slurred speech, and patient aware that his speech sounded different, and he was " more slobbery."  Patient concerned about having an additional stroke - took vitals - normal.  Assured patient that he had good support, and oversight medically.  Discussed signs and symptoms of stroke, and what to do in the case of stroke. Patient able to verbalize the importance of  managing his blood pressure, and indicates "he don't mess around no more" with timing of BP meds.    Neurological Re-education Exercises   [name] Exercises 1 Patient with dense sensory loss in his left upper extremity, yet he has the ability to compensate for this loss by attending visually to this extremity.  Patient remains distractible, and when attention drifts from his hand, he loses grip, and drops items - consistently.  Patient seems to do best with tasks which have consequences to demand his concentration, e.g. carrying a plastic plate across the room.  When he drops the plate - it makes a loud crashing sound.  Standing at sink and filling cup with water, if he loses focus - cup slips and splashes water on him.  Patient definitely can attend for longer periods within a task when such task has potentially negative consequences.     [name] Exercises 2 Patient has difficulty controlling finer aspects of forearm, wrist and hand despite movement present in all joints.  Patient tends to overuse shoulder and trunk movement to compensate for lack of distal control.  Patient encouraged to utilize right hand as a model for how left hand should work when manipulating smaller objects. Patient today able to manipulate large pegs into pegboard with minimal cueing and increased time.                  OT Education - 09/25/14 1723    Education provided Yes   Education Details Discussed the need to reduce supervision and assistance with ADL at home.  Patient in agreement - need to discuss with wife who was not available this session.   Person(s) Educated Patient   Methods Explanation   Comprehension Verbalized understanding          OT Short Term Goals - 09/25/14 1729    OT SHORT TERM GOAL #1   Title Patient will independently don socks   Status Achieved   OT SHORT TERM GOAL #2   Title Patient will demonstrate sufficient pincer grasp to tighten shoelace with left hand   Status Achieved   OT  SHORT TERM GOAL #3   Title Patient will visually attend to activity presented in midline for 10 minutes in minimally distracting environment   Status Achieved   OT SHORT TERM GOAL #4   Title Patient will demonstrate ability to reach left arm overhead (at least 140 degrees shoulder flexion) while upright to hit target and repeat 5 times in preparation for overhead reach / release or grasp   Status Achieved   OT SHORT TERM GOAL #5   Title Patient will prepare a simple cold snack following 2 step directives in minimally distracting environment   Status Partially Met  OT Long Term Goals - 09/25/14 1729    OT LONG TERM GOAL #1   Title Patient will dress himself with modified independence   Status On-going   OT LONG TERM GOAL #2   Title Patient will shower himself, including transfer into and out of tub with modified independence   Status On-going   OT LONG TERM GOAL #3   Title Patient will complete grooming tasks independently, using visula cues for thoroughness (eg - shaving using mirror for guidance)   Status Achieved   OT LONG TERM GOAL #4   Title Patient will effectively use long handled gardeing tool to hoe/rake garden in standing using bilateral upper extremities   Status On-going   OT LONG TERM GOAL #5   Title Patient will demonstrate safe use of knife for cutting food at a meal, or during meal preparation.  9This goal requires effective use of two hands in this task)   Status On-going               Plan - 09/25/14 1726    Clinical Impression Statement Patient is showing steady improvement in his ability to attend to left, compensate for sensory loss, andimproved overall awareness which is helping him require less assistance for simple ADL.     Pt will benefit from skilled therapeutic intervention in order to improve on the following deficits (Retired) Decreased balance;Decreased cognition;Decreased coordination;Decreased endurance;Decreased knowledge of  precautions;Decreased knowledge of use of DME;Decreased range of motion;Decreased safety awareness;Impaired perceived functional ability;Increased edema;Decreased strength;Impaired sensation;Impaired tone;Impaired UE functional use;Impaired vision/preception   Rehab Potential Good   OT Frequency 2x / week   OT Duration 8 weeks   OT Treatment/Interventions Self-care/ADL training;Electrical Stimulation;Therapeutic exercise;Neuromuscular education;DME and/or AE instruction;Manual Therapy;Functional Mobility Training;Splinting;Therapeutic exercises;Therapeutic activities;Cognitive remediation/compensation;Visual/perceptual remediation/compensation;Patient/family education;Balance training   Plan Encourage wife to attend if able to discuss reducing assistance for ADL, Need HEP for fine motor    Consulted and Agree with Plan of Care Patient        Problem List Patient Active Problem List   Diagnosis Date Noted  . Alterations of sensations following CVA (cerebrovascular accident) 09/10/2014  . Atrial fibrillation 09/06/2014  . Left spastic hemiparesis 08/07/2014  . Left-sided neglect 08/07/2014  . Right middle cerebral artery stroke 08/01/2014  . Tobacco dependence   . Carotid artery occlusion with infarction   . History of ETOH abuse   . Hyperlipidemia   . Essential hypertension, benign 10/11/2013    Mariah Milling, OTR/L 09/25/2014, 5:31 PM  [facility] [address] [place], [place], [address] Phone: [phone]   Fax:  [phone]

## 2014-09-26 ENCOUNTER — Other Ambulatory Visit: Payer: Self-pay | Admitting: *Deleted

## 2014-09-26 ENCOUNTER — Encounter: Payer: Self-pay | Admitting: Internal Medicine

## 2014-09-26 MED ORDER — RIVAROXABAN 20 MG PO TABS: 20.0000 mg | ORAL_TABLET | Freq: Every day | ORAL | Status: DC

## 2014-09-27 ENCOUNTER — Ambulatory Visit: Payer: BLUE CROSS/BLUE SHIELD | Admitting: Physical Therapy

## 2014-09-27 ENCOUNTER — Ambulatory Visit: Payer: BLUE CROSS/BLUE SHIELD | Admitting: Occupational Therapy

## 2014-09-27 ENCOUNTER — Ambulatory Visit: Payer: BLUE CROSS/BLUE SHIELD

## 2014-09-27 ENCOUNTER — Encounter: Payer: Self-pay | Admitting: Occupational Therapy

## 2014-09-27 DIAGNOSIS — R41841 Cognitive communication deficit: Secondary | ICD-10-CM

## 2014-09-27 DIAGNOSIS — I69359 Hemiplegia and hemiparesis following cerebral infarction affecting unspecified side: Secondary | ICD-10-CM

## 2014-09-27 DIAGNOSIS — R41840 Attention and concentration deficit: Secondary | ICD-10-CM

## 2014-09-27 DIAGNOSIS — I69998 Other sequelae following unspecified cerebrovascular disease: Secondary | ICD-10-CM

## 2014-09-27 DIAGNOSIS — R209 Unspecified disturbances of skin sensation: Principal | ICD-10-CM

## 2014-09-27 NOTE — Therapy (Signed)
[facility] [address] [place], [place], [address] Phone: [phone]   Fax:  [phone]  Speech Language Pathology Treatment  Patient Details  Name: [name] MRN: [id number] Date of Birth: 19-Jan-1956 Referring Provider:  Barton Fanny, MD  Encounter Date: 09/27/2014      End of Session - 09/27/14 1446    Visit Number 12   Number of Visits 16   Date for SLP Re-Evaluation 10/15/14   Authorization - Visit Number 11   Authorization - Number of Visits 82   SLP Start Time 3354   SLP Stop Time  1446   SLP Time Calculation (min) 43 min   Activity Tolerance Patient tolerated treatment well      Past Medical History  Diagnosis Date  . Arthritis   . Hypertension   . Hyperlipidemia   . Stroke     a. s/p MDT ILR implant   . Cor triatriatum     a. identified on TEE at time of stroke  . Paroxysmal atrial fibrillation     a. identified on LINQ 08/2014    Past Surgical History  Procedure Laterality Date  . Loop recorder implant  07/27/14    LOOP REVEAL LINQ [id number] - [id number]  . Tee without cardioversion N/A 07/27/2014    Procedure: TRANSESOPHAGEAL ECHOCARDIOGRAM (TEE);  Surgeon: Pixie Casino, MD;  Location: [facility];  Service: Cardiovascular;  Laterality: N/A;  . Ep implantable device N/A 07/27/2014    Procedure: Loop Recorder Insertion;  Surgeon: Evans Lance, MD;  Location: [facility];  Service: Cardiovascular;  Laterality: N/A;    There were no vitals filed for this visit.  Visit Diagnosis: Cognitive communication deficit      Subjective Assessment - 09/27/14 1409    Subjective "I left my folder (with homework in it) here Tuesday."               ADULT SLP TREATMENT - 09/27/14 1409    General Information   Behavior/Cognition Alert;Cooperative;Pleasant mood   Treatment Provided   Treatment provided Cognitive-Linquistic   Pain Assessment   Pain Assessment No/denies pain   Cognitive-Linquistic Treatment   Treatment focused on Cognition   Skilled Treatment Pt was engaged in written stimuli in a quiet environment for improving attention skills (sustained and selective)  and req'd mod A usually for selective attention due to internal distraction. Pt did not correct his work and told SLP he was ready to have it corrected. Decr'd emergent awareness seen during this task. Took pt 18 minutes to complete task with SLP cues for error awareness. In picture sequencing task, error awareness was encouraged to pt prior to telling SLP to check his work, however mod A occasionally needed to ID errors.    Assessment / Recommendations / Plan   Plan Continue with current plan of care   Progression Toward Goals   Progression toward goals Progressing toward goals            SLP Short Term Goals - 09/17/14 1249    SLP SHORT TERM GOAL #1   Title pt will attend to 10 minute cognitive communication task in a mod distracting environment with rare min A back to task   Status Not Met   SLP SHORT TERM GOAL #2   Title pt will complete simple verbal time, money, and math problems 75% success and rare min A   Status Not Met   SLP SHORT TERM GOAL #3   Title  pt will demonstrate intellectual awareness to cognitive communicaiton deficits by telling SLP 3 non-physical deficits   Status Not Met          SLP Long Term Goals - 09/27/14 1448    SLP LONG TERM GOAL #1   Title pt will demo alternating attention in a simple cognitive communication task (looking at a chart and answering question) by having 85% success on the task with rare min A   Time 2   Period Weeks   Status Revised  revised 09-05-14   SLP LONG TERM GOAL #2   Title pt will demonstrate emergent awareness in simple cognitive linguistic tasks by correcting errors 60% of opportunities   Time 2   Period Weeks   Status Revised  revised 09-05-14   SLP LONG TERM GOAL #3   Title pt will give appropriate solutions verbally to  functional problems 95% of the time   Time 2   Period Weeks   Status On-going   SLP LONG TERM GOAL #4   Title pt to use some kind of memory compensation system twice during a therapy session   Time 2   Period Weeks   Status On-going          Plan - 09/27/14 1446    Clinical Impression Statement Approximately 4 1/2 minutes of work today prior to pt requiring cues to return to task. Cont skilled ST to work with pt to maximize cognitive skills for reduced caregiver burden.   Speech Therapy Frequency 2x / week   Duration 2 weeks   Treatment/Interventions Compensatory techniques;Functional tasks;Patient/family education;SLP instruction and feedback;Cognitive reorganization;Environmental controls;Internal/external aids   Potential to Achieve Goals Good        Problem List Patient Active Problem List   Diagnosis Date Noted  . Alterations of sensations following CVA (cerebrovascular accident) 09/10/2014  . Atrial fibrillation 09/06/2014  . Left spastic hemiparesis 08/07/2014  . Left-sided neglect 08/07/2014  . Right middle cerebral artery stroke 08/01/2014  . Tobacco dependence   . Carotid artery occlusion with infarction   . History of ETOH abuse   . Hyperlipidemia   . Essential hypertension, benign 10/11/2013    [facility] , [place], [place]  09/27/2014, 2:49 PM  [facility] [address] [place], [place], [address] Phone: [phone]   Fax:  [phone]

## 2014-09-27 NOTE — Therapy (Signed)
[facility] [address] [place], [place], [address] Phone: [phone]   Fax:  [phone]  Occupational Therapy Treatment  Patient Details  Name: [name] MRN: [id number] Date of Birth: 05-May-1955 Referring Provider:  Barton Fanny, MD  Encounter Date: 09/27/2014      OT End of Session - 09/27/14 1725    Visit Number 15   Number of Visits 17   Date for OT Re-Evaluation 10/10/14   Authorization Type [employer] - have discussed renewing patient for an additional 4 weeks to further improve functional use of left hand   Authorization - Visit Number 15   Authorization - Number of Visits 30   OT Start Time 6962   OT Stop Time 1528   OT Time Calculation (min) 41 min   Activity Tolerance Patient tolerated treatment well   Behavior During Therapy [facility] for tasks assessed/performed      Past Medical History  Diagnosis Date  . Arthritis   . Hypertension   . Hyperlipidemia   . Stroke     a. s/p MDT ILR implant   . Cor triatriatum     a. identified on TEE at time of stroke  . Paroxysmal atrial fibrillation     a. identified on LINQ 08/2014    Past Surgical History  Procedure Laterality Date  . Loop recorder implant  07/27/14    LOOP REVEAL LINQ [id number] - [id number]  . Tee without cardioversion N/A 07/27/2014    Procedure: TRANSESOPHAGEAL ECHOCARDIOGRAM (TEE);  Surgeon: Pixie Casino, MD;  Location: [facility];  Service: Cardiovascular;  Laterality: N/A;  . Ep implantable device N/A 07/27/2014    Procedure: Loop Recorder Insertion;  Surgeon: Evans Lance, MD;  Location: [facility];  Service: Cardiovascular;  Laterality: N/A;    There were no vitals filed for this visit.  Visit Diagnosis:  Sensation alteration, late effect of cerebrovascular disease  Hemiparesis affecting nondominant side as late effect of cerebrovascular accident  Inattention      Subjective Assessment - 09/27/14 1652    Subjective  Now if I want something bad enough, I can pick it up with my left hand.   Patient Stated Goals return to gardening, independence with self care.   Currently in Pain? No/denies   Pain Score 0-No pain                      OT Treatments/Exercises (OP) - 09/27/14 0001    Fine Motor Coordination   Fine Motor Coordination Large Pegboard;Thumb opposition;Maneuvering Blocks;In hand manipuation training   In Nurse, learning disability and bilateral task training with varied practice and repetition.     Large Pegboard Patient able to grasp / release with increase visual attention, yet had greater difficulty maneuvering hand and arm within confines of various height pegs in board.                 OT Education - 09/27/14 1725    Education provided Yes   Education Details discussed importance of visual attention to increase use of left hand   Person(s) Educated Patient   Methods Explanation   Comprehension Verbalized understanding;Returned demonstration          OT Short Term Goals - 09/25/14 1729    OT SHORT TERM GOAL #1   Title Patient will independently don socks   Status Achieved   OT SHORT TERM GOAL #2   Title Patient will demonstrate sufficient pincer  grasp to tighten shoelace with left hand   Status Achieved   OT SHORT TERM GOAL #3   Title Patient will visually attend to activity presented in midline for 10 minutes in minimally distracting environment   Status Achieved   OT [place] #4   Title Patient will demonstrate ability to reach left arm overhead (at least 140 degrees shoulder flexion) while upright to hit target and repeat 5 times in preparation for overhead reach / release or grasp   Status Achieved   OT SHORT TERM GOAL #5   Title Patient will prepare a simple cold snack following 2 step directives in minimally distracting environment   Status Partially Met           OT Long Term Goals - 09/25/14 1729    OT LONG TERM  GOAL #1   Title Patient will dress himself with modified independence   Status On-going   OT LONG TERM GOAL #2   Title Patient will shower himself, including transfer into and out of tub with modified independence   Status On-going   OT LONG TERM GOAL #3   Title Patient will complete grooming tasks independently, using visula cues for thoroughness (eg - shaving using mirror for guidance)   Status Achieved   OT LONG TERM GOAL #4   Title Patient will effectively use long handled gardeing tool to hoe/rake garden in standing using bilateral upper extremities   Status On-going   OT LONG TERM GOAL #5   Title Patient will demonstrate safe use of knife for cutting food at a meal, or during meal preparation.  9This goal requires effective use of two hands in this task)   Status On-going               Plan - 09/27/14 1727    Clinical Impression Statement Patient continues to show improvement in his ability to grasp, release vairous sized objects in left hand.  Patient is showing improved carryover of therapeutic startegies and is reporting decreased need for asssitance with ADL.   Pt will benefit from skilled therapeutic intervention in order to improve on the following deficits (Retired) Decreased balance;Decreased cognition;Decreased coordination;Decreased endurance;Decreased knowledge of precautions;Decreased knowledge of use of DME;Decreased range of motion;Decreased safety awareness;Impaired perceived functional ability;Increased edema;Decreased strength;Impaired sensation;Impaired tone;Impaired UE functional use;Impaired vision/preception   Rehab Potential Good   OT Frequency 2x / week   OT Duration 8 weeks   OT Treatment/Interventions Self-care/ADL training;Electrical Stimulation;Therapeutic exercise;Neuromuscular education;DME and/or AE instruction;Manual Therapy;Functional Mobility Training;Splinting;Therapeutic exercises;Therapeutic activities;Cognitive  remediation/compensation;Visual/perceptual remediation/compensation;Patient/family education;Balance training   Plan Encourage wife to attend if able to discuss reducing assistance for ADL, NMR LUE, need HEP for fine motor   OT Home Exercise Plan issued 09/06/14, simple functional use of LUE, added home activity to incorporate more particiaption in ADL   Consulted and Agree with Plan of Care Patient        Problem List Patient Active Problem List   Diagnosis Date Noted  . Alterations of sensations following CVA (cerebrovascular accident) 09/10/2014  . Atrial fibrillation 09/06/2014  . Left spastic hemiparesis 08/07/2014  . Left-sided neglect 08/07/2014  . Right middle cerebral artery stroke 08/01/2014  . Tobacco dependence   . Carotid artery occlusion with infarction   . History of ETOH abuse   . Hyperlipidemia   . Essential hypertension, benign 10/11/2013    Mariah Milling, OTR/L 09/27/2014, 5:33 PM  [facility] [address] [place], [place], [address] Phone:  [phone]   Fax:  [phone]

## 2014-09-27 NOTE — Progress Notes (Signed)
Loop recorder 

## 2014-10-01 ENCOUNTER — Other Ambulatory Visit: Payer: Self-pay

## 2014-10-01 LAB — CUP PACEART REMOTE DEVICE CHECK
Date Time Interrogation Session: 20160809154007
MDC IDC SET ZONE DETECTION INTERVAL: 2000 ms
Zone Setting Detection Interval: 3000 ms
Zone Setting Detection Interval: 350 ms

## 2014-10-02 ENCOUNTER — Ambulatory Visit: Payer: BLUE CROSS/BLUE SHIELD

## 2014-10-02 ENCOUNTER — Ambulatory Visit: Payer: BLUE CROSS/BLUE SHIELD | Admitting: Physical Therapy

## 2014-10-02 ENCOUNTER — Ambulatory Visit: Payer: BLUE CROSS/BLUE SHIELD | Admitting: Occupational Therapy

## 2014-10-02 ENCOUNTER — Telehealth: Payer: Self-pay

## 2014-10-02 DIAGNOSIS — H539 Unspecified visual disturbance: Secondary | ICD-10-CM

## 2014-10-02 DIAGNOSIS — I69998 Other sequelae following unspecified cerebrovascular disease: Secondary | ICD-10-CM

## 2014-10-02 DIAGNOSIS — R41841 Cognitive communication deficit: Secondary | ICD-10-CM

## 2014-10-02 DIAGNOSIS — I69359 Hemiplegia and hemiparesis following cerebral infarction affecting unspecified side: Secondary | ICD-10-CM

## 2014-10-02 DIAGNOSIS — R4189 Other symptoms and signs involving cognitive functions and awareness: Secondary | ICD-10-CM

## 2014-10-02 DIAGNOSIS — R209 Unspecified disturbances of skin sensation: Secondary | ICD-10-CM

## 2014-10-02 DIAGNOSIS — R41840 Attention and concentration deficit: Secondary | ICD-10-CM

## 2014-10-02 NOTE — Therapy (Signed)
[facility] [address] [place], [place], [address] Phone: [phone]   Fax:  [phone]  Occupational Therapy Treatment  Patient Details  Name: [name] MRN: [id number] Date of Birth: 1956-02-09 Referring Provider:  Barton Fanny, MD  Encounter Date: 10/02/2014      OT End of Session - 10/02/14 1420    Visit Number 16   Number of Visits 17   Authorization Type [employer] - have discussed renewing patient for an additional 4 weeks to further improve functional use of left hand   Authorization - Visit Number 16   Authorization - Number of Visits 30   OT Start Time 4315   OT Stop Time 1445   OT Time Calculation (min) 42 min   Activity Tolerance Patient tolerated treatment well   Behavior During Therapy WFL for tasks assessed/performed      Past Medical History  Diagnosis Date  . Arthritis   . Hypertension   . Hyperlipidemia   . Cor triatriatum     a. identified on TEE at time of stroke  . Paroxysmal atrial fibrillation     a. identified on LINQ 08/2014  . Stroke 07/25/14    a. s/p MDT ILR implant     Past Surgical History  Procedure Laterality Date  . Loop recorder implant  07/27/14    LOOP REVEAL LINQ [id number] - [id number]  . Tee without cardioversion N/A 07/27/2014    Procedure: TRANSESOPHAGEAL ECHOCARDIOGRAM (TEE);  Surgeon: Pixie Casino, MD;  Location: [facility];  Service: Cardiovascular;  Laterality: N/A;  . Ep implantable device N/A 07/27/2014    Procedure: Loop Recorder Insertion;  Surgeon: Evans Lance, MD;  Location: [facility];  Service: Cardiovascular;  Laterality: N/A;    There were no vitals filed for this visit.  Visit Diagnosis:  Inattention  Visual disturbance  Hemiparesis affecting nondominant side as late effect of cerebrovascular accident  Sensation alteration, late effect of cerebrovascular disease  Impaired cognition      Subjective Assessment - 10/03/14 1222     Subjective  Reports doing most of his own self care   Pertinent History see snapshot   Patient Stated Goals return to gardening, independence with self care.   Currently in Pain? Yes   Pain Score 2    Pain Location Shoulder   Pain Orientation Left   Pain Descriptors / Indicators Aching   Pain Onset More than a month ago   Pain Frequency Intermittent   Aggravating Factors  malpositioning   Pain Relieving Factors repositioning   Multiple Pain Sites No            [facility] OT Assessment - 10/03/14 0001    Coordination   Box and Blocks left:18 blocks      Treatment: Theraputic activities: Therapist educated pt in coordination HEP, and pt returned demonstration following cues/ demonstration. Functional grasp/ release of various sized cylindrical pegs in standing with LUE to place in container, min-mod v.c., facilitation.  Box/ blocks performed today with 18 blocks LUE. Self care:Therapist started checking progress towards goals. Pt simulated cutting food, and demonstrated difficulty maintaining grip in fork with LUE, pt was provided with a foam grip and he demonstrated improved performance.                     OT Education - 10/03/14 1219    Education provided Yes   Education Details coordination HEP   Person(s) Educated Patient   Methods  Explanation;Demonstration;Verbal cues;Handout   Comprehension Verbalized understanding;Returned demonstration;Verbal cues required          OT Short Term Goals - 09/25/14 1729    OT SHORT TERM GOAL #1   Title Patient will independently don socks   Status Achieved   OT SHORT TERM GOAL #2   Title Patient will demonstrate sufficient pincer grasp to tighten shoelace with left hand   Status Achieved   OT SHORT TERM GOAL #3   Title Patient will visually attend to activity presented in midline for 10 minutes in minimally distracting environment   Status Achieved   OT [place] #4   Title Patient will demonstrate ability  to reach left arm overhead (at least 140 degrees shoulder flexion) while upright to hit target and repeat 5 times in preparation for overhead reach / release or grasp   Status Achieved   OT SHORT TERM GOAL #5   Title Patient will prepare a simple cold snack following 2 step directives in minimally distracting environment   Status Partially Met           OT Long Term Goals - 10/02/14 1408    OT [place] #1   Title Patient will dress himself with modified independence   Baseline occasional min A-not met 10/02/14   Status On-going   OT LONG TERM GOAL #2   Title Patient will shower himself, including transfer into and out of tub with modified independence   Baseline -not met, supervision 10/02/14   Status On-going   OT LONG TERM GOAL #3   Title Patient will complete grooming tasks independently, using visual cues for thoroughness (eg - shaving using mirror for guidance)   Status Achieved   OT LONG TERM GOAL #4   Title Patient will effectively use long handled gardeing tool to hoe/rake garden in standing using bilateral upper extremities   Status On-going   OT LONG TERM GOAL #5   Title Patient will demonstrate safe use of knife for cutting food at a meal, or during meal preparation.  9This goal requires effective use of two hands in this task)   Baseline not fully met, pt drops knife, large grip issued 10/02/14   Status On-going               Plan - 10/02/14 1454    Clinical Impression Statement Pt is progressing towards goals. He demonstrated ability to pick up 18 blocks in 1 min today for box/ blocks. Plan to renew in next several visits.   Pt will benefit from skilled therapeutic intervention in order to improve on the following deficits (Retired) Decreased balance;Decreased cognition;Decreased coordination;Decreased endurance;Decreased knowledge of precautions;Decreased knowledge of use of DME;Decreased range of motion;Decreased safety awareness;Impaired perceived  functional ability;Increased edema;Decreased strength;Impaired sensation;Impaired tone;Impaired UE functional use;Impaired vision/preception   Rehab Potential Good   OT Frequency 2x / week   OT Duration 8 weeks   OT Treatment/Interventions Self-care/ADL training;Electrical Stimulation;Therapeutic exercise;Neuromuscular education;DME and/or AE instruction;Manual Therapy;Functional Mobility Training;Splinting;Therapeutic exercises;Therapeutic activities;Cognitive remediation/compensation;Visual/perceptual remediation/compensation;Patient/family education;Balance training   Plan check goals and renew, encourage wife to attend if present to discuss reducing assist with ADLs   OT Home Exercise Plan issued 09/06/14, simple functional use of LUE, added home activity to incorporate more particiaption in ADL   Consulted and Agree with Plan of Care Patient        Problem List Patient Active Problem List   Diagnosis Date Noted  . Alterations of sensations following CVA (cerebrovascular accident) 09/10/2014  .  Atrial fibrillation 09/06/2014  . Left spastic hemiparesis 08/07/2014  . Left-sided neglect 08/07/2014  . Right middle cerebral artery stroke 08/01/2014  . Tobacco dependence   . Carotid artery occlusion with infarction   . History of ETOH abuse   . Hyperlipidemia   . Essential hypertension, benign 10/11/2013    RINE,KATHRYN 10/03/2014, 12:24 PM Theone Murdoch, OTR/L [phone] Phone: [phone] 12:24 PM 10/03/2014 [facility] [address] [place], [place], [address] Phone: [phone]   Fax:  [phone]

## 2014-10-02 NOTE — Therapy (Signed)
[facility] [address] [place], [place], [address] Phone: [phone]   Fax:  [phone]  Speech Language Pathology Treatment  Patient Details  Name: [name] MRN: [id number] Date of Birth: [birth year] Referring Provider:  Barton Fanny, MD  Encounter Date: 10/02/2014      End of Session - 10/02/14 1529    Visit Number 13   Number of Visits 16   Date for SLP Re-Evaluation 10/15/14   Authorization - Visit Number 12   Authorization - Number of Visits 23   SLP Start Time 2536   SLP Stop Time  1529   SLP Time Calculation (min) 40 min   Activity Tolerance Patient tolerated treatment well      Past Medical History  Diagnosis Date  . Arthritis   . Hypertension   . Hyperlipidemia   . Stroke     a. s/p MDT ILR implant   . Cor triatriatum     a. identified on TEE at time of stroke  . Paroxysmal atrial fibrillation     a. identified on LINQ 08/2014    Past Surgical History  Procedure Laterality Date  . Loop recorder implant  07/27/14    LOOP REVEAL LINQ [id number] - [id number]  . Tee without cardioversion N/A 07/27/2014    Procedure: TRANSESOPHAGEAL ECHOCARDIOGRAM (TEE);  Surgeon: Pixie Casino, MD;  Location: [facility];  Service: Cardiovascular;  Laterality: N/A;  . Ep implantable device N/A 07/27/2014    Procedure: Loop Recorder Insertion;  Surgeon: Evans Lance, MD;  Location: [facility];  Service: Cardiovascular;  Laterality: N/A;    There were no vitals filed for this visit.  Visit Diagnosis: Cognitive communication deficit      Subjective Assessment - 10/02/14 1454    Subjective Pt had wife check homework - pt told SLP he had errors.               ADULT SLP TREATMENT - 10/02/14 1501    General Information   Behavior/Cognition Alert;Cooperative;Pleasant mood   Treatment Provided   Treatment provided Cognitive-Linquistic   Pain Assessment   Pain Assessment No/denies pain    Cognitive-Linquistic Treatment   Treatment focused on Cognition   Skilled Treatment Pt stated to SLP today his minimal dysarthria is not bothersome to him as SLP offered to work on an HEP fo dysarthria with him. Pt was engaged in written stimuli in a quiet environment for improving attention skills (sustained and selective) and req'd A for selective attention due to internal distraction. Decr'd emergent awareness seen during this task. Pt told SLP he was going to check his work, however min A consistently needed to ID errors.  SLP asked how pt was going to remember to do homework provided and pt said he would remember once he got his folder out.   Assessment / Recommendations / Plan   Plan Continue with current plan of care   Progression Toward Goals   Progression toward goals Progressing toward goals          SLP Education - 10/02/14 1529    Education provided Yes   Education Details medication administration compensations   Person(s) Educated Patient   Methods Explanation   Comprehension Verbalized understanding          SLP Short Term Goals - 09/17/14 1249    SLP SHORT TERM GOAL #1   Title pt will attend to 10 minute cognitive communication task in a mod distracting environment with rare  min A back to task   Status Not Met   SLP SHORT TERM GOAL #2   Title pt will complete simple verbal time, money, and math problems 75% success and rare min A   Status Not Met   SLP SHORT TERM GOAL #3   Title pt will demonstrate intellectual awareness to cognitive communicaiton deficits by telling SLP 3 non-physical deficits   Status Not Met          SLP Long Term Goals - 10/02/14 1521    SLP LONG TERM GOAL #1   Title pt will demo alternating attention in a simple cognitive communication task (looking at a chart and answering question) by having 85% success on the task with rare min A   Time 2  3 more visits   Period Weeks   Status Revised  revised 09-05-14   SLP LONG TERM GOAL #2    Title pt will demonstrate emergent awareness in simple cognitive linguistic tasks by correcting errors 60% of opportunities   Time 2  3 more visits   Period Weeks   Status Revised  revised 09-05-14   SLP LONG TERM GOAL #3   Title pt will give appropriate solutions verbally to functional problems 95% of the time   Status Achieved   SLP LONG TERM GOAL #4   Title pt to use some kind of memory compensation system twice during a therapy session   Time 2  3 more visits   Period Weeks   Status On-going          Plan - 10/02/14 1534    Duration 2 weeks        Problem List Patient Active Problem List   Diagnosis Date Noted  . Alterations of sensations following CVA (cerebrovascular accident) 09/10/2014  . Atrial fibrillation 09/06/2014  . Left spastic hemiparesis 08/07/2014  . Left-sided neglect 08/07/2014  . Right middle cerebral artery stroke 08/01/2014  . Tobacco dependence   . Carotid artery occlusion with infarction   . History of ETOH abuse   . Hyperlipidemia   . Essential hypertension, benign 10/11/2013    [facility] , [place], [place]  10/02/2014, 3:38 PM  [facility] [address] [place], [place], [address] Phone: [phone]   Fax:  [phone]

## 2014-10-02 NOTE — Patient Instructions (Signed)
Practice the following with left hand for 20 mins 1x day Flip playing cards Pick up checkers, screw and unscrew nuts and bolts Pass a ball between your hands Open plastic bottles or containers Pick up small shampoo bottles and place in a container

## 2014-10-02 NOTE — Patient Instructions (Signed)
   Think about how I can help you do more with you taking your medications

## 2014-10-03 ENCOUNTER — Ambulatory Visit (INDEPENDENT_AMBULATORY_CARE_PROVIDER_SITE_OTHER): Payer: BLUE CROSS/BLUE SHIELD | Admitting: Diagnostic Neuroimaging

## 2014-10-03 ENCOUNTER — Encounter: Payer: Self-pay | Admitting: Diagnostic Neuroimaging

## 2014-10-03 VITALS — BP 118/82 | HR 77 | Ht 65.5 in | Wt 182.2 lb

## 2014-10-03 DIAGNOSIS — E785 Hyperlipidemia, unspecified: Secondary | ICD-10-CM | POA: Diagnosis not present

## 2014-10-03 DIAGNOSIS — I10 Essential (primary) hypertension: Secondary | ICD-10-CM | POA: Diagnosis not present

## 2014-10-03 DIAGNOSIS — I639 Cerebral infarction, unspecified: Secondary | ICD-10-CM | POA: Diagnosis not present

## 2014-10-03 DIAGNOSIS — I480 Paroxysmal atrial fibrillation: Secondary | ICD-10-CM

## 2014-10-03 NOTE — Progress Notes (Signed)
[facility]  PATIENT: [name] DOB: [birth year]  REFERRING CLINICIAN: Hospital stroke team / Hospitalist Team HISTORY FROM: patient and wife  REASON FOR VISIT: new consult    HISTORICAL  CHIEF COMPLAINT:  Chief Complaint  Patient presents with  . Acute ischemic stroke    rm 6, wife - Marylu Lund  . hospital FU    HISTORY OF PRESENT ILLNESS:   UPDATE / NEW HPI: 59 year old male with hypertension, hypercholesterolemia, atrial fibrillation, stroke, here for hospital follow-up evaluation. Patient was admitted to the hospital in June 2016 for right brain ischemic infarction, with readmission due to increasing left body symptoms. Patient had numbness weakness on the left face, arm greater than leg. Follow-up MRI showed extension of previous right MCA stroke. Patient has occluded right internal carotid artery. Embolic phenomenon from stump emboli was considered. Patient had loop recorder placed and then patient went through rehabilitation. Patient was discharged on aspirin and Plavix. On 09/03/2014 patient was diagnosed with runs of atrial fibrillation, followed up in cardiology clinic, and was started on Xarelto anticoagulation. Plavix was stopped. Aspirin was continued. Otherwise since that time patient has done fairly well. Overall symptoms are stable. No new neurologic issues.   PRIOR HPI (Stroke team, Dr. Pearlean Brownie, 07/30/14): "59 y.o. male with a past medical history significant for HTN, hyperlipidemia, right MCA territory infarct on 6/8 with single right M2 branch occlusion identified just beyond the bifurcation and occluded right ICA at its origin, s/p loop recorder placement, returns to the ED for evaluation of left face weakness, left hemiparesis, and dysarthria. Wife indicated that patient was doing reasonably well after discharge from the hospital 2 days, ut since this morning 07/29/2014, time unknown (LKW) he has been having some trouble with mobility, quieter that usual,  and then throughout the course of the day she noticed that he was drooling, his left face was droopy, he was slurring his words, and approximately by 6:42 was not able to use his left arm. Wife said that the left face weakness and left arm weakness are new. Denies HA, vertigo, double vision, or visual impairment. NIHSS 12. CT brain showed no acute abnormality. In comparison to last MRI brain, there is an evolving infarct at the anterior right frontal operculum, the right insular cortex, with vaguesuperior extension into the subcortical white matter of the right parietal lobe. Patient is on aspirin and plavix since recent discharge from the hospital for large vessel disease ( R ICA occlusion). Patient was not administered TPA secondary to recent stroke. He was admitted for further evaluation and treatment."  REVIEW OF SYSTEMS: Full 14 system review of systems performed and notable only for depression decreased energy slurred speech and confusion memory loss.  ALLERGIES: No Known Allergies  HOME MEDICATIONS: Outpatient Prescriptions Prior to Visit  Medication Sig Dispense Refill  . aspirin EC 81 MG EC tablet Take 1 tablet (81 mg total) by mouth daily. 30 tablet 0  . atorvastatin (LIPITOR) 80 MG tablet TAKE 1 TABLET BY MOUTH DAILY AT 6 PM 90 tablet 3  . lisinopril (PRINIVIL,ZESTRIL) 5 MG tablet Take 1 tablet (5 mg total) by mouth daily. 30 tablet 1  . polyethylene glycol (MIRALAX / GLYCOLAX) packet Take 17 g by mouth daily. 14 each 0  . rivaroxaban (XARELTO) 20 MG TABS tablet Take 1 tablet (20 mg total) by mouth daily with supper. 90 tablet 3  . nicotine (NICODERM CQ - DOSED IN MG/24 HOURS) 14 mg/24hr patch 14 mg patch daily 2 weeks then 7  mg patch daily 3 weeks. (Patient not taking: Reported on 10/03/2014) 28 patch 0   No facility-administered medications prior to visit.    PAST MEDICAL HISTORY: Past Medical History  Diagnosis Date  . Arthritis   . Hypertension   . Hyperlipidemia   . Cor  triatriatum     a. identified on TEE at time of stroke  . Paroxysmal atrial fibrillation     a. identified on LINQ 08/2014  . Stroke 07/25/14    a. s/p MDT ILR implant     PAST SURGICAL HISTORY: Past Surgical History  Procedure Laterality Date  . Loop recorder implant  07/27/14    LOOP REVEAL LINQ UJW11 - [id number]  . Tee without cardioversion N/A 07/27/2014    Procedure: TRANSESOPHAGEAL ECHOCARDIOGRAM (TEE);  Surgeon: Chrystie Nose, MD;  Location: [facility] ENDOSCOPY;  Service: Cardiovascular;  Laterality: N/A;  . Ep implantable device N/A 07/27/2014    Procedure: Loop Recorder Insertion;  Surgeon: Marinus Maw, MD;  Location: [facility] INVASIVE CV LAB;  Service: Cardiovascular;  Laterality: N/A;    FAMILY HISTORY: Family History  Problem Relation Age of Onset  . Cancer Father     leukemia  . Heart attack Brother   . Hypertension Brother     SOCIAL HISTORY:  Social History   Social History  . Marital Status: Married    Spouse Name: Marylu Lund  . Number of Children: 3  . Years of Education: 9   Occupational History  . meat cutter    Social History Main Topics  . Smoking status: Current Every Day Smoker -- 0.50 packs/day for 40 years    Types: Cigarettes  . Smokeless tobacco: Not on file     Comment: 10/03/14 currently smoking 1 cig a day  . Alcohol Use: 10.8 oz/week    18 Cans of beer per week     Comment: 10/03/14 one beer every week or two  . Drug Use: No  . Sexual Activity: Yes   Other Topics Concern  . Not on file   Social History Narrative   Lives at home with wife, Marylu Lund   Caffeine use - tea 4-5 glasses a day     PHYSICAL EXAM  GENERAL EXAM/CONSTITUTIONAL: Vitals:  Filed Vitals:   10/03/14 0912  BP: 118/82  Pulse: 77  Height: 5' 5.5" (1.664 m)  Weight: 182 lb 3.2 oz (82.645 kg)     Body mass index is 29.85 kg/(m^2).   Wt Readings from Last 3 Encounters:  10/03/14 182 lb 3.2 oz (82.645 kg)  09/10/14 184 lb 6.4 oz (83.643 kg)  09/06/14 183 lb 3.2 oz (83.099  kg)    Visual Acuity Screening   Right eye Left eye Both eyes  Without correction: 20/30 20/30   With correction:        Patient is in no distress; well developed, nourished and groomed; neck is supple  CARDIOVASCULAR:  Examination of carotid arteries is normal; no carotid bruits  Regular rate and rhythm, no murmurs  Examination of peripheral vascular system by observation and palpation is normal  EYES:  Ophthalmoscopic exam of optic discs and posterior segments is normal; no papilledema or hemorrhages  MUSCULOSKELETAL:  Gait, strength, tone, movements noted in Neurologic exam below  NEUROLOGIC: MENTAL STATUS:  No flowsheet data found.  awake, alert, oriented to person, place and time  recent and remote memory intact  normal attention and concentration  language fluent, comprehension intact, naming intact,   fund of knowledge appropriate  CRANIAL NERVE:  2nd - no papilledema on fundoscopic exam  2nd, 3rd, 4th, 6th - pupils equal and reactive to light, visual fields full to confrontation, extraocular muscles intact, no nystagmus  5th - facial sensation --> DECR ON LEFT FACE / EAR  7th - facial strength --> DECR LEFT NL FOLD AND LEFT LOWER STRENGTH  8th - hearing intact  9th - palate elevates symmetrically, uvula midline  11th - shoulder shrug symmetric  12th - tongue protrusion midline  MILD SLURRED SPEECH  MOTOR:   normal bulk and tone ON RUE AND RLE; LUE 3-4; LLE 4  SENSORY:   normal and symmetric to light touch; DECR PP IN LUE AND LLE  COORDINATION:   finger-nose-finger, fine finger movements SLOW ON LEFT SIDE   REFLEXES:   deep tendon reflexes present and SLIGHTLY BRISK ON LEFT SIDE  GAIT/STATION:   narrow based gait     DIAGNOSTIC DATA (LABS, IMAGING, TESTING) - I reviewed patient records, labs, notes, testing and imaging myself where available.  Lab Results  Component Value Date   WBC 8.9 09/03/2014   HGB 14.6 09/03/2014     HCT 43.8 09/03/2014   MCV 94.1 09/03/2014   PLT 306.0 09/03/2014      Component Value Date/Time   NA 135 09/03/2014 1223   K 4.1 09/03/2014 1223   CL 101 09/03/2014 1223   CO2 26 09/03/2014 1223   GLUCOSE 107* 09/03/2014 1223   BUN 18 09/03/2014 1223   CREATININE 0.73 09/03/2014 1223   CREATININE 0.75 05/10/2014 0951   CALCIUM 10.0 09/03/2014 1223   PROT 7.6 08/02/2014 0636   ALBUMIN 3.5 08/02/2014 0636   AST 26 08/02/2014 0636   ALT 26 08/02/2014 0636   ALKPHOS 71 08/02/2014 0636   BILITOT 1.0 08/02/2014 0636   GFRNONAA >60 08/02/2014 0636   GFRAA >60 08/02/2014 0636   Lab Results  Component Value Date   CHOL 192 07/26/2014   HDL 34* 07/26/2014   LDLCALC 97 07/26/2014   TRIG 305* 07/26/2014   CHOLHDL 5.6 07/26/2014   Lab Results  Component Value Date   HGBA1C 6.0* 07/26/2014   No results found for: [id number] Lab Results  Component Value Date   TSH 3.313 07/31/2014    07/25/14 MRI brain [I reviewed images myself and agree with interpretation. -VRP]  1. Acute nonhemorrhagic infarct of the right frontal operculum and anterior portion of the insular cortex. 2. Acute punctate infarct in the high right parietal lobe near the vertex. 3. Slow or occluded flow in the right internal carotid artery to the level of the posterior communicating artery. 4. Age advanced periventricular T2 changes likely reflect the sequela of chronic microvascular ischemia. These results were called by telephone at the time of interpretation on 07/25/2014 at 6:23 pm to Dr. Thana Farr , who verbally acknowledged these results.  07/29/14 MRI brain [I reviewed images myself and agree with interpretation. -VRP]  - Acute moderate RIGHT middle cerebral artery territory infarct (further involvement of the operculum, new involvement of the insula and RIGHT basal ganglia), propagated from prior imaging. No hemorrhagic conversion. - Suspected occluded RIGHT internal carotid artery, with slow flow versus  occluded RIGHT middle cerebral artery.  07/26/14 CTA head/neck [I reviewed images myself and agree with interpretation. -VRP]  1. Right ICA is occluded at its origin with no reconstituted flow in the neck. Moderate to poor reconstitution of the distal right ICA siphon. Right MCA origin and M1 segment are patent. Single right M2 branch occlusion identified just beyond  the bifurcation. 2. No other major circle of Willis branch occlusion. Stenosis versus non dominance of the right ACA A1 segment. 3. Left ICA origin and bulb atherosclerosis without hemodynamically significant stenosis. Up to moderate left vertebral artery V1 segment stenosis. 4. Stable CT appearance of the brain since 07/25/2014. 5. Poor dentition.  07/27/14 TEE  - No cardiac source of emboli was indentified. Cor triatriatum dextrum.    ASSESSMENT AND PLAN  59 y.o. year old male here with:   Stroke: Extension of Non-dominant right MCA infarct, felt to be embolic from stump emboli from R ICA occlusion vs atrial fibrillation  ResultantLeft hemiparesis (face, arm > leg)  Ongoing aggressive stroke risk factor management   Atrial fibrillation  Continue xarelto  Hypertension  Continue lisinopril  Hyperlipidemia  Continue atorvastatin  Other Stroke Risk Factors  Cigarette smoker --> advised to stop smoking, now down to 1 cigarette per day  ETOH use --> advised to limited to 1 drink per day, now down to a few beers every 2 weeks   PLAN: - refer to check SLEEP STUDY (eval for sleep apnea due to stroke and HTN diagnoses) - continue xarelto for atrial fibrillation - stop aspirin - continue BP meds and statin   Orders Placed This Encounter  Procedures  . Ambulatory referral to Sleep Studies   Return for return to Dr. Pearlean Brownie in 6 months.    Suanne Marker, MD 10/03/2014, 9:41 AM Certified in Neurology, Neurophysiology and Neuroimaging   [facility] [address], [address] [place], [place] [address] [phone]

## 2014-10-03 NOTE — Patient Instructions (Signed)
Continue xarelto.  Stop aspirin.

## 2014-10-03 NOTE — Telephone Encounter (Signed)
Call patient and discuss.  Per discharger summary 08/01/14:    STOP taking these medications       lisinopril-hydrochlorothiazide 20-12.5 MG per tablet  Commonly known as: PRINZIDE,ZESTORETIC

## 2014-10-04 ENCOUNTER — Ambulatory Visit: Payer: BLUE CROSS/BLUE SHIELD | Admitting: Occupational Therapy

## 2014-10-04 ENCOUNTER — Ambulatory Visit: Payer: BLUE CROSS/BLUE SHIELD | Admitting: Physical Therapy

## 2014-10-04 ENCOUNTER — Encounter: Payer: Self-pay | Admitting: Occupational Therapy

## 2014-10-04 ENCOUNTER — Ambulatory Visit: Payer: BLUE CROSS/BLUE SHIELD

## 2014-10-04 DIAGNOSIS — I69359 Hemiplegia and hemiparesis following cerebral infarction affecting unspecified side: Secondary | ICD-10-CM

## 2014-10-04 DIAGNOSIS — I69998 Other sequelae following unspecified cerebrovascular disease: Secondary | ICD-10-CM

## 2014-10-04 DIAGNOSIS — R41840 Attention and concentration deficit: Secondary | ICD-10-CM

## 2014-10-04 DIAGNOSIS — R209 Unspecified disturbances of skin sensation: Secondary | ICD-10-CM

## 2014-10-04 DIAGNOSIS — R41841 Cognitive communication deficit: Secondary | ICD-10-CM

## 2014-10-04 DIAGNOSIS — H539 Unspecified visual disturbance: Secondary | ICD-10-CM

## 2014-10-04 NOTE — Therapy (Signed)
[facility] [address] [place], [place], [address] Phone: [phone]   Fax:  [phone]  Occupational Therapy Treatment  Patient Details  Name: [name] MRN: [id number] Date of Birth: 07-14-1955 Referring Provider:  Barton Fanny, MD  Encounter Date: 10/04/2014      OT End of Session - 10/04/14 1624    Visit Number 17   Number of Visits 25   Date for OT Re-Evaluation 10/10/14   Authorization Type [employer] Will renew for 4 additional weeks- 10/04/14   Authorization - Visit Number 85   Authorization - Number of Visits 30   OT Start Time 4132   OT Stop Time 4401   OT Time Calculation (min) 38 min   Equipment Utilized During Treatment gripper, digicizer- 1.5, 3 lbs   Activity Tolerance Patient tolerated treatment well   Behavior During Therapy WFL for tasks assessed/performed      Past Medical History  Diagnosis Date  . Arthritis   . Hypertension   . Hyperlipidemia   . Cor triatriatum     a. identified on TEE at time of stroke  . Paroxysmal atrial fibrillation     a. identified on LINQ 08/2014  . Stroke 07/25/14    a. s/p MDT ILR implant     Past Surgical History  Procedure Laterality Date  . Loop recorder implant  07/27/14    LOOP REVEAL LINQ UUV25 - [id number]  . Tee without cardioversion N/A 07/27/2014    Procedure: TRANSESOPHAGEAL ECHOCARDIOGRAM (TEE);  Surgeon: Pixie Casino, MD;  Location: [facility];  Service: Cardiovascular;  Laterality: N/A;  . Ep implantable device N/A 07/27/2014    Procedure: Loop Recorder Insertion;  Surgeon: Evans Lance, MD;  Location: [place] [facility] CV LAB;  Service: Cardiovascular;  Laterality: N/A;    There were no vitals filed for this visit.  Visit Diagnosis:  Inattention - Plan: Ot plan of care cert/re-cert  Visual disturbance - Plan: Ot plan of care cert/re-cert  Hemiparesis affecting nondominant side as late effect of cerebrovascular accident - Plan: Ot plan  of care cert/re-cert  Sensation alteration, late effect of cerebrovascular disease - Plan: Ot plan of care cert/re-cert      Subjective Assessment - 10/03/14 1222    Subjective  Reports doing most of his own self care   Pertinent History see snapshot   Patient Stated Goals return to gardening, independence with self care.   Currently in Pain? Yes   Pain Score 2    Pain Location Shoulder   Pain Orientation Left   Pain Descriptors / Indicators Aching   Pain Onset More than a month ago   Pain Frequency Intermittent   Aggravating Factors  malpositioning   Pain Relieving Factors repositioning   Multiple Pain Sites No                      OT Treatments/Exercises (OP) - 10/04/14 0001    ADLs   ADL Comments Patient indicates that for the first time this morning patient's wife did not directrly supervise him while he showered.  Patient very pleased with newfound independence woth personal care.    Fine Motor Coordination   Other Fine Motor Exercises Patient attempted to toss and catch soft ball with left hand.  Patient with best performance in quiet environment without visual distractions.  Patient needing cueing to visually attend to position of left hand on objects.     Other Fine Motor Exercises Patient able  to utilize gripper at [employer] resistance to pick up 1 inch blocks for 10-15 repetitions                OT Education - 10/04/14 1623    Education provided Yes   Education Details coordination HEP   Person(s) Educated Patient   Methods Explanation;Demonstration;Verbal cues   Comprehension Verbalized understanding;Returned demonstration;Verbal cues required          OT Short Term Goals - 09/25/14 1729    OT SHORT TERM GOAL #1   Title Patient will independently don socks   Status Achieved   OT SHORT TERM GOAL #2   Title Patient will demonstrate sufficient pincer grasp to tighten shoelace with left hand   Status Achieved   OT SHORT TERM GOAL #3    Title Patient will visually attend to activity presented in midline for 10 minutes in minimally distracting environment   Status Achieved   OT [place] #4   Title Patient will demonstrate ability to reach left arm overhead (at least 140 degrees shoulder flexion) while upright to hit target and repeat 5 times in preparation for overhead reach / release or grasp   Status Achieved   OT SHORT TERM GOAL #5   Title Patient will prepare a simple cold snack following 2 step directives in minimally distracting environment   Status Partially Met           OT Long Term Goals - 10/04/14 1635    OT LONG TERM GOAL #1   Title Patient will dress himself with modified independence   Baseline occasional min A-not met 10/02/14   Status On-going   OT LONG TERM GOAL #2   Title Patient will shower himself, including transfer into and out of tub with modified independence   Status Partially Met   OT LONG TERM GOAL #3   Title Patient will complete grooming tasks independently, using visual cues for thoroughness (eg - shaving using mirror for guidance)   Status Achieved   OT LONG TERM GOAL #4   Title Patient will effectively use long handled gardeing tool to hoe/rake garden in standing using bilateral upper extremities   Status Partially Met   OT LONG TERM GOAL #5   Title Patient will demonstrate safe use of knife for cutting food at a meal, or during meal preparation.  9This goal requires effective use of two hands in this task)   Baseline not fully met, pt drops knife, large grip issued 10/02/14   Status On-going               Plan - 10/04/14 1631    Clinical Impression Statement Patient has made significant progress with basic self care skills, and is reporting increased independence.  Patient with improved attention to task, yet still does best in controlled environemt.  Plan to renew patient for 4 additional weeks / 8 visits of OT to further enhance functiuonal use of left hand.     Pt  will benefit from skilled therapeutic intervention in order to improve on the following deficits (Retired) Decreased balance;Decreased cognition;Decreased coordination;Decreased endurance;Decreased knowledge of precautions;Decreased knowledge of use of DME;Decreased range of motion;Decreased safety awareness;Impaired perceived functional ability;Increased edema;Decreased strength;Impaired sensation;Impaired tone;Impaired UE functional use;Impaired vision/preception   Rehab Potential Good   OT Frequency 2x / week   OT Duration 4 weeks   OT Treatment/Interventions Self-care/ADL training;Electrical Stimulation;Therapeutic exercise;Neuromuscular education;DME and/or AE instruction;Manual Therapy;Functional Mobility Training;Splinting;Therapeutic exercises;Therapeutic activities;Cognitive remediation/compensation;Visual/perceptual remediation/compensation;Patient/family education;Balance training   Plan encourage wife  to be present with OT session to ensure independence with ADL/IADL   OT Home Exercise Plan issued 09/06/14, simple functional use of LUE, added home activity to incorporate more particiaption in ADL   Consulted and Agree with Plan of Care Patient        Problem List Patient Active Problem List   Diagnosis Date Noted  . Alterations of sensations following CVA (cerebrovascular accident) 09/10/2014  . Atrial fibrillation 09/06/2014  . Left spastic hemiparesis 08/07/2014  . Left-sided neglect 08/07/2014  . Right middle cerebral artery stroke 08/01/2014  . Tobacco dependence   . Carotid artery occlusion with infarction   . History of ETOH abuse   . Hyperlipidemia   . Essential hypertension, benign 10/11/2013    Mariah Milling, OTR/L 10/04/2014, 4:43 PM  [facility] [address] [place], [place], [address] Phone: [phone]   Fax:  [phone]

## 2014-10-04 NOTE — Therapy (Signed)
[facility] [address] [place], [place], [address] Phone: [phone]   Fax:  [phone]  Speech Language Pathology Treatment  Patient Details  Name: [name] MRN: [id number] Date of Birth: 04/19/1955 Referring Provider:  Barton Fanny, MD  Encounter Date: 10/04/2014      End of Session - 10/04/14 1443    Visit Number 14   Number of Visits 16   Date for SLP Re-Evaluation 10/15/14   Authorization - Visit Number 40   Authorization - Number of Visits 58   SLP Start Time 6659   SLP Stop Time  1445   SLP Time Calculation (min) 40 min   Activity Tolerance Patient tolerated treatment well      Past Medical History  Diagnosis Date  . Arthritis   . Hypertension   . Hyperlipidemia   . Cor triatriatum     a. identified on TEE at time of stroke  . Paroxysmal atrial fibrillation     a. identified on LINQ 08/2014  . Stroke 07/25/14    a. s/p MDT ILR implant     Past Surgical History  Procedure Laterality Date  . Loop recorder implant  07/27/14    LOOP REVEAL LINQ [id number] - [id number]  . Tee without cardioversion N/A 07/27/2014    Procedure: TRANSESOPHAGEAL ECHOCARDIOGRAM (TEE);  Surgeon: Pixie Casino, MD;  Location: [facility];  Service: Cardiovascular;  Laterality: N/A;  . Ep implantable device N/A 07/27/2014    Procedure: Loop Recorder Insertion;  Surgeon: Evans Lance, MD;  Location: [facility];  Service: Cardiovascular;  Laterality: N/A;    There were no vitals filed for this visit.  Visit Diagnosis: Cognitive communication deficit      Subjective Assessment - 10/04/14 1434    Subjective Pt without emergent awareness on homework.               ADULT SLP TREATMENT - 10/04/14 1409    General Information   Behavior/Cognition Alert;Cooperative;Pleasant mood   Treatment Provided   Treatment provided Cognitive-Linquistic   Pain Assessment   Pain Assessment No/denies pain   Cognitive-Linquistic Treatment   Treatment focused on Cognition   Skilled Treatment Pt forgot his glasses today. SLP assisted pt in increasing emergent awareness in a familiar task (schedule-making). Pt req'd max A consistently.   Assessment / Recommendations / Plan   Plan Continue with current plan of care   Progression Toward Goals   Progression toward goals Not progressing toward goals (comment)            SLP Short Term Goals - 09/17/14 1249    SLP SHORT TERM GOAL #1   Title pt will attend to 10 minute cognitive communication task in a mod distracting environment with rare min A back to task   Status Not Met   SLP SHORT TERM GOAL #2   Title pt will complete simple verbal time, money, and math problems 75% success and rare min A   Status Not Met   SLP SHORT TERM GOAL #3   Title pt will demonstrate intellectual awareness to cognitive communicaiton deficits by telling SLP 3 non-physical deficits   Status Not Met          SLP Long Term Goals - 10/04/14 1446    SLP LONG TERM GOAL #1   Title pt will demo alternating attention in a simple cognitive communication task (looking at a chart and answering question) by having 85% success on the task with  rare min A   Time 2  3 more visits   Period Weeks   Status Revised  revised 09-05-14   SLP LONG TERM GOAL #2   Title pt will demonstrate emergent awareness in simple cognitive linguistic tasks by correcting errors 60% of opportunities   Time 2  3 more visits   Period Weeks   Status Revised  revised 09-05-14   SLP LONG TERM GOAL #3   Title pt will give appropriate solutions verbally to functional problems 95% of the time   Status Achieved   SLP LONG TERM GOAL #4   Title pt to use some kind of memory compensation system twice during a therapy session   Time 2  3 more visits   Period Weeks   Status On-going          Plan - 10/04/14 1444    Clinical Impression Statement Decr'd attention and awareness cont'd. Pt with incr'd  difficulty with error awareness today, requiring max SLP cues usually.   Speech Therapy Frequency 2x / week   Duration 2 weeks   Treatment/Interventions Compensatory techniques;Functional tasks;Patient/family education;SLP instruction and feedback;Cognitive reorganization;Environmental controls;Internal/external aids   Potential to Achieve Goals Good   Potential Considerations Severity of impairments;Ability to learn/carryover information        Problem List Patient Active Problem List   Diagnosis Date Noted  . Alterations of sensations following CVA (cerebrovascular accident) 09/10/2014  . Atrial fibrillation 09/06/2014  . Left spastic hemiparesis 08/07/2014  . Left-sided neglect 08/07/2014  . Right middle cerebral artery stroke 08/01/2014  . Tobacco dependence   . Carotid artery occlusion with infarction   . History of ETOH abuse   . Hyperlipidemia   . Essential hypertension, benign 10/11/2013    [facility] , [place], [place]  10/04/2014, 2:47 PM  [facility] [address] [place], [place], [address] Phone: [phone]   Fax:  [phone]

## 2014-10-04 NOTE — Patient Instructions (Signed)
Coordination exercises for left hand  1)  Try tossing tennis ball just above your left hand - toss and catch in left hand 2)  Try tossing two tennis balls - left to left hand and right to right hand 3)  When this is too easy, or you have caught 10 out of 10 tosses, try tossing just a bit higher.

## 2014-10-09 ENCOUNTER — Ambulatory Visit: Payer: BLUE CROSS/BLUE SHIELD | Admitting: Speech Pathology

## 2014-10-09 ENCOUNTER — Ambulatory Visit: Payer: BLUE CROSS/BLUE SHIELD | Admitting: Occupational Therapy

## 2014-10-09 ENCOUNTER — Encounter: Payer: Self-pay | Admitting: Occupational Therapy

## 2014-10-09 ENCOUNTER — Ambulatory Visit: Payer: BLUE CROSS/BLUE SHIELD | Admitting: Physical Therapy

## 2014-10-09 DIAGNOSIS — R41841 Cognitive communication deficit: Secondary | ICD-10-CM

## 2014-10-09 DIAGNOSIS — R41840 Attention and concentration deficit: Secondary | ICD-10-CM

## 2014-10-09 DIAGNOSIS — I69998 Other sequelae following unspecified cerebrovascular disease: Secondary | ICD-10-CM

## 2014-10-09 DIAGNOSIS — R209 Unspecified disturbances of skin sensation: Secondary | ICD-10-CM

## 2014-10-09 DIAGNOSIS — H539 Unspecified visual disturbance: Secondary | ICD-10-CM

## 2014-10-09 DIAGNOSIS — I69359 Hemiplegia and hemiparesis following cerebral infarction affecting unspecified side: Secondary | ICD-10-CM

## 2014-10-09 DIAGNOSIS — R4189 Other symptoms and signs involving cognitive functions and awareness: Secondary | ICD-10-CM

## 2014-10-09 NOTE — Therapy (Signed)
[facility] [address] [place], [place], [address] Phone: [phone]   Fax:  [phone]  Speech Language Pathology Treatment  Patient Details  Name: Rodgerick Gilliand MRN: [id number] Date of Birth: 07/12/1955 Referring Provider:  Barton Fanny, MD  Encounter Date: 10/09/2014      End of Session - 10/09/14 1503    Visit Number 15   Number of Visits 24   Date for SLP Re-Evaluation 11/06/14   Authorization - Visit Number 15   Authorization - Number of Visits 30   SLP Start Time 9470   SLP Stop Time  1445   SLP Time Calculation (min) 43 min   Activity Tolerance Patient tolerated treatment well      Past Medical History  Diagnosis Date  . Arthritis   . Hypertension   . Hyperlipidemia   . Cor triatriatum     a. identified on TEE at time of stroke  . Paroxysmal atrial fibrillation     a. identified on LINQ 08/2014  . Stroke 07/25/14    a. s/p MDT ILR implant     Past Surgical History  Procedure Laterality Date  . Loop recorder implant  07/27/14    LOOP REVEAL LINQ [id number] - [id number]  . Tee without cardioversion N/A 07/27/2014    Procedure: TRANSESOPHAGEAL ECHOCARDIOGRAM (TEE);  Surgeon: Pixie Casino, MD;  Location: [facility];  Service: Cardiovascular;  Laterality: N/A;  . Ep implantable device N/A 07/27/2014    Procedure: Loop Recorder Insertion;  Surgeon: Evans Lance, MD;  Location: [facility];  Service: Cardiovascular;  Laterality: N/A;    There were no vitals filed for this visit.  Visit Diagnosis: Cognitive communication deficit - Plan: SLP plan of care cert/re-cert, CANCELED: SLP PLAN OF CARE CERT/RE-CERT      Subjective Assessment - 10/09/14 1408    Subjective "I use my phone to help me with the date"               ADULT SLP TREATMENT - 10/09/14 1408    General Information   Behavior/Cognition Alert;Cooperative;Pleasant mood   Treatment Provided   Treatment provided  Cognitive-Linquistic   Pain Assessment   Pain Assessment No/denies pain   Cognitive-Linquistic Treatment   Treatment focused on Cognition   Skilled Treatment Pt is without his glasses today. Pt verbalized cognitive impairments of "focus" and "I forget - that's probably one there." Facilitated  attention to detail with corrected errors  with usual mod  visual and verbal cues. Pt demonstrated alternating attention between 3 piles of cards in game, while attending/recalling 3 rules of the game (Blink) with rare min verbal and tactile cues - with 85% accuracy. Pt solved simple time problems with 85% accuracy, extended time and rare min A.    Assessment / Recommendations / Plan   Plan Continue with current plan of care   Progression Toward Goals   Progression toward goals Not progressing toward goals (comment)  renewal completed with updated goals          SLP Education - 10/09/14 1455    Education provided No          SLP Short Term Goals - 09/17/14 1249    SLP SHORT TERM GOAL #1   Title pt will attend to 10 minute cognitive communication task in a mod distracting environment with rare min A back to task   Status Not Met   SLP SHORT TERM GOAL #2   Title pt will  complete simple verbal time, money, and math problems 75% success and rare min A   Status Not Met   SLP SHORT TERM GOAL #3   Title pt will demonstrate intellectual awareness to cognitive communicaiton deficits by telling SLP 3 non-physical deficits   Status Not Met          SLP Long Term Goals - 10/09/14 1459    SLP LONG TERM GOAL #1   Title pt will demo alternating attention in a simple cognitive communication task (looking at a chart and answering question) by having 85% success on the task with rare min A   Time 2  3 more visits   Period Weeks   Status Achieved  revised 09-05-14   SLP LONG TERM GOAL #2   Title pt will demonstrate emergent awareness in simple cognitive linguistic tasks by correcting errors 60% of  opportunities   Time 2  3 more visits   Period Weeks   Status Not Met  revised 09-05-14   SLP LONG TERM GOAL #3   Title pt will give appropriate solutions verbally to functional problems 95% of the time   Status Achieved   SLP LONG TERM GOAL #4   Title pt to use some kind of memory compensation system twice during a therapy session   Time 2  3 more visits   Period Weeks   Status On-going   SLP LONG TERM GOAL #5   Title Pt will alternate attention between 2 simple cogntive linguistic tasks with 90% accuracy and rare min A (goal set 10/09/14)   Time 4   Period Weeks   Status New   Additional Long Term Goals   Additional Long Term Goals Yes   SLP LONG TERM GOAL #6   Title Pt will demonsrate compensations for attention and recall deficits 1x a session with rare min A (goal set 10/09/14)   Time 4   Period Weeks   Status New   SLP LONG TERM GOAL #7   Title Pt will solve simple time, money, reasoning, organization cogntive linguistic tasks with 90% accuracy and occassional min A (goal set 10/09/14)   Time 4   Period Weeks   Status New          Plan - 10/09/14 1455    Clinical Impression Statement Pt with improved alternating attention on familiar tasks, with rare min A. Mr. Chance continues to require usual min to mod cues for attention to details, emergent awareness of cognitive deficits and attention to details. I recommend Mr. Yakubov continue skilled ST 2x a week for 4 more weeks (8/30 to 11/09/14) to maximize attention, awareness and simple problem solving/reasoning for improved independence and to reduce caregiver burdent).   Speech Therapy Frequency 2x / week   Duration 4 weeks   Treatment/Interventions Compensatory techniques;Functional tasks;Patient/family education;SLP instruction and feedback;Cognitive reorganization;Environmental controls;Internal/external aids   Potential to Achieve Goals Good   Potential Considerations Severity of impairments;Ability to learn/carryover  information   Consulted and Agree with Plan of Care Patient        Problem List Patient Active Problem List   Diagnosis Date Noted  . Alterations of sensations following CVA (cerebrovascular accident) 09/10/2014  . Atrial fibrillation 09/06/2014  . Left spastic hemiparesis 08/07/2014  . Left-sided neglect 08/07/2014  . Right middle cerebral artery stroke 08/01/2014  . Tobacco dependence   . Carotid artery occlusion with infarction   . History of ETOH abuse   . Hyperlipidemia   . Essential hypertension, benign 10/11/2013  Constantine Ruddick, Annye Rusk MS, CCC-SLP 10/09/2014, 3:13 PM  [facility] [address] [place], [place], [address] Phone: [phone]   Fax:  [phone]

## 2014-10-09 NOTE — Patient Instructions (Signed)
For dressing:  Try and let Tiernan figure it out before you jump in and help him.  If he can't try to give him so verbal direction first (such as "look at your left hand" or "use the mirror to try and figure it out").  For shirt: 1. Find the tag 2. Lay the shirt face down 3. Dress RIGHT arm first, then LEFT arm then overhead. 4. Do it the same way every day.  For underwear and pants: 1. Find the tag. 2. Lay it face up on your lap. 3. Gather up the LEFT leg hole first 4. Dress the LEFT leg, then the RIGHT leg 5. Cue Steward to look at his left hand while he tries to pull up pants 6. If pants are crooked have Dyshawn use the mirror to try and straighten them.  Try not to help him physically unless it is absolutely necessary!!!

## 2014-10-09 NOTE — Therapy (Signed)
[facility] [address] [place], [place], [address] Phone: [phone]   Fax:  [phone]  Occupational Therapy Treatment  Patient Details  Name: [name] MRN: [id number] Date of Birth: 10/13/1955 Referring Provider:  Barton Fanny, MD  Encounter Date: 10/09/2014      OT End of Session - 10/09/14 1641    Visit Number 18   Number of Visits 25   Date for OT Re-Evaluation 11/08/14   Authorization Type [employer]    OT Start Time 1445   OT Stop Time 1530   OT Time Calculation (min) 45 min   Activity Tolerance Patient tolerated treatment well      Past Medical History  Diagnosis Date  . Arthritis   . Hypertension   . Hyperlipidemia   . Cor triatriatum     a. identified on TEE at time of stroke  . Paroxysmal atrial fibrillation     a. identified on LINQ 08/2014  . Stroke 07/25/14    a. s/p MDT ILR implant     Past Surgical History  Procedure Laterality Date  . Loop recorder implant  07/27/14    LOOP REVEAL LINQ [id number] - [id number]  . Tee without cardioversion N/A 07/27/2014    Procedure: TRANSESOPHAGEAL ECHOCARDIOGRAM (TEE);  Surgeon: Pixie Casino, MD;  Location: [facility];  Service: Cardiovascular;  Laterality: N/A;  . Ep implantable device N/A 07/27/2014    Procedure: Loop Recorder Insertion;  Surgeon: Evans Lance, MD;  Location: [facility];  Service: Cardiovascular;  Laterality: N/A;    There were no vitals filed for this visit.  Visit Diagnosis:  Inattention  Visual disturbance  Hemiparesis affecting nondominant side as late effect of cerebrovascular accident  Sensation alteration, late effect of cerebrovascular disease  Impaired cognition      Subjective Assessment - 10/09/14 1450    Subjective  I went to the golf tournament this weekend   Pertinent History see snapshot   Patient Stated Goals return to gardening, independence with self care.   Currently in Pain? Yes   Pain  Score 4   5 or 6 at night and when he first gets up   Pain Location Shoulder   Pain Orientation Left   Pain Descriptors / Indicators Stabbing   Pain Onset More than a month ago   Pain Frequency Intermittent   Aggravating Factors  sleeping, raising arm above his head   Pain Relieving Factors working it out.  moving around                      OT Treatments/Exercises (OP) - 10/09/14 0001    ADLs   UB Dressing Addressed UB and LB dressing - pt initially reports that he is "doing great with this" however with questioning wife is still assisting daily. Practiced donning and doffing shit and shorts with strategies to compensate for L neglect and perceptual deficits. Pt requires mod vc's to find fronts and backs, tops and bottoms and to orient self to clothing. Tried several strategies to compensate for these and pt was eventually successful with mod vc's to slow down due to impulsivity and to use strategies.  Pt required rote practice several times to recall and implement strategies. Pt provide with written instructions to share with wife - please see pt instruction section.                OT Education - 10/09/14 1635    Education provided Yes  Education Details dressing strategies   Person(s) Educated Patient   Methods Explanation;Demonstration;Tactile cues;Verbal cues;Handout   Comprehension Verbalized understanding;Returned demonstration  pt to share written instructions with wife          OT Short Term Goals - 10/09/14 1636    OT SHORT TERM GOAL #1   Title Patient will independently don socks   Status Achieved   OT SHORT TERM GOAL #2   Title Patient will demonstrate sufficient pincer grasp to tighten shoelace with left hand   Status Achieved   OT SHORT TERM GOAL #3   Title Patient will visually attend to activity presented in midline for 10 minutes in minimally distracting environment   Status Achieved   OT [place] #4   Title Patient will  demonstrate ability to reach left arm overhead (at least 140 degrees shoulder flexion) while upright to hit target and repeat 5 times in preparation for overhead reach / release or grasp   Status Achieved   OT SHORT TERM GOAL #5   Title Patient will prepare a simple cold snack following 2 step directives in minimally distracting environment   Status Partially Met           OT Long Term Goals - 10/09/14 1636    OT LONG TERM GOAL #1   Title Patient will dress himself with modified independence - 11/08/2014   Status On-going   OT LONG TERM GOAL #2   Title Patient will shower himself, including transfer into and out of tub with modified independence   Status On-going   OT LONG TERM GOAL #3   Status Achieved   OT LONG TERM GOAL #4   Title Patient will effectively use long handled gardeing tool to hoe/rake garden in standing using bilateral upper extremities   Status On-going   OT LONG TERM GOAL #5   Title Patient will demonstrate safe use of knife for cutting food at a meal, or during meal preparation.  9This goal requires effective use of two hands in this task)   Status On-going               Plan - 10/09/14 1637    Clinical Impression Statement Pt continues to fluctuate with ability for self care tasks due to L neglect, perceputal deficits and cognitive deficits. Pt is able to dress with mod vc's and improves with repetition of strategies.   Pt will benefit from skilled therapeutic intervention in order to improve on the following deficits (Retired) Decreased balance;Decreased cognition;Decreased coordination;Decreased endurance;Decreased knowledge of precautions;Decreased knowledge of use of DME;Decreased range of motion;Decreased safety awareness;Impaired perceived functional ability;Increased edema;Decreased strength;Impaired sensation;Impaired tone;Impaired UE functional use;Impaired vision/preception   Rehab Potential Good   OT Frequency 2x / week   OT Duration 4 weeks    OT Treatment/Interventions Self-care/ADL training;Electrical Stimulation;Therapeutic exercise;Neuromuscular education;DME and/or AE instruction;Manual Therapy;Functional Mobility Training;Splinting;Therapeutic exercises;Therapeutic activities;Cognitive remediation/compensation;Visual/perceptual remediation/compensation;Patient/family education;Balance training   Plan check dressing, continue to work on incoporation of LUE and attention to [place] issued 09/06/14, simple functional use of LUE, added home activity to incorporate more particiaption in ADL   Consulted and Agree with Plan of Care Patient        Problem List Patient Active Problem List   Diagnosis Date Noted  . Alterations of sensations following CVA (cerebrovascular accident) 09/10/2014  . Atrial fibrillation 09/06/2014  . Left spastic hemiparesis 08/07/2014  . Left-sided neglect 08/07/2014  . Right middle cerebral artery stroke 08/01/2014  .  Tobacco dependence   . Carotid artery occlusion with infarction   . History of ETOH abuse   . Hyperlipidemia   . Essential hypertension, benign 10/11/2013    Quay Burow, OTR/L 10/09/2014, 4:43 PM  [facility] [address] [place], [place], [address] Phone: [phone]   Fax:  [phone]

## 2014-10-11 ENCOUNTER — Ambulatory Visit: Payer: BLUE CROSS/BLUE SHIELD | Admitting: Occupational Therapy

## 2014-10-11 ENCOUNTER — Ambulatory Visit: Payer: BLUE CROSS/BLUE SHIELD

## 2014-10-11 ENCOUNTER — Encounter: Payer: Self-pay | Admitting: Occupational Therapy

## 2014-10-11 ENCOUNTER — Ambulatory Visit: Payer: BLUE CROSS/BLUE SHIELD | Admitting: Physical Therapy

## 2014-10-11 DIAGNOSIS — H539 Unspecified visual disturbance: Secondary | ICD-10-CM

## 2014-10-11 DIAGNOSIS — R41841 Cognitive communication deficit: Secondary | ICD-10-CM | POA: Diagnosis not present

## 2014-10-11 DIAGNOSIS — I69998 Other sequelae following unspecified cerebrovascular disease: Secondary | ICD-10-CM

## 2014-10-11 DIAGNOSIS — R41840 Attention and concentration deficit: Secondary | ICD-10-CM

## 2014-10-11 DIAGNOSIS — I69359 Hemiplegia and hemiparesis following cerebral infarction affecting unspecified side: Secondary | ICD-10-CM

## 2014-10-11 DIAGNOSIS — R209 Unspecified disturbances of skin sensation: Secondary | ICD-10-CM

## 2014-10-11 NOTE — Patient Instructions (Signed)
  Please complete the assigned speech therapy homework and return it to your next session.  

## 2014-10-11 NOTE — Therapy (Signed)
[facility] [address] [place], [place], [address] Phone: [phone]   Fax:  [phone]  Speech Language Pathology Treatment  Patient Details  Name: [name] MRN: [id number] Date of Birth: Jun 23, 1955 Referring Provider:  Barton Fanny, MD  Encounter Date: 10/11/2014      End of Session - 10/11/14 1634    Visit Number 16   Number of Visits 24   Date for SLP Re-Evaluation 11/06/14   Authorization - Visit Number 2   Authorization - Number of Visits 30   SLP Start Time 8309   SLP Stop Time  1616   SLP Time Calculation (min) 44 min   Activity Tolerance Patient tolerated treatment well      Past Medical History  Diagnosis Date  . Arthritis   . Hypertension   . Hyperlipidemia   . Cor triatriatum     a. identified on TEE at time of stroke  . Paroxysmal atrial fibrillation     a. identified on LINQ 08/2014  . Stroke 07/25/14    a. s/p MDT ILR implant     Past Surgical History  Procedure Laterality Date  . Loop recorder implant  07/27/14    LOOP REVEAL LINQ [id number] - [id number]  . Tee without cardioversion N/A 07/27/2014    Procedure: TRANSESOPHAGEAL ECHOCARDIOGRAM (TEE);  Surgeon: Pixie Casino, MD;  Location: [facility];  Service: Cardiovascular;  Laterality: N/A;  . Ep implantable device N/A 07/27/2014    Procedure: Loop Recorder Insertion;  Surgeon: Evans Lance, MD;  Location: [facility];  Service: Cardiovascular;  Laterality: N/A;    There were no vitals filed for this visit.  Visit Diagnosis: Cognitive communication deficit      Subjective Assessment - 10/11/14 1609    Subjective "I have trouble with (putting fingers on eyes) ---- focus, yah I guess I do."               ADULT SLP TREATMENT - 10/11/14 1612    General Information   Behavior/Cognition Cooperative;Pleasant mood;Distractible;Alert   Treatment Provided   Treatment provided Cognitive-Linquistic   Pain  Assessment   Pain Assessment No/denies pain   Cognitive-Linquistic Treatment   Treatment focused on Cognition   Skilled Treatment Pt acknowledged his defictits in attention after min verbal cues by SLP (awareness on homework of error after he "corected" homework). SLP saw decr'd emergent awareness in every task today and req'd SLP mod A usually for ID of errors. SLP learned that pt may have premorbid difficulty with more complex language.   Assessment / Recommendations / Plan   Plan Continue with current plan of care   Progression Toward Goals   Progression toward goals Not progressing toward goals (comment)          SLP Education - 10/11/14 1634    Education provided Yes   Education Details error awareness   Person(s) Educated Patient   Methods Explanation;Verbal cues   Comprehension Verbalized understanding;Verbal cues required;Need further instruction          SLP Short Term Goals - 09/17/14 1249    SLP SHORT TERM GOAL #1   Title pt will attend to 10 minute cognitive communication task in a mod distracting environment with rare min A back to task   Status Not Met   SLP SHORT TERM GOAL #2   Title pt will complete simple verbal time, money, and math problems 75% success and rare min A   Status Not Met  SLP SHORT TERM GOAL #3   Title pt will demonstrate intellectual awareness to cognitive communicaiton deficits by telling SLP 3 non-physical deficits   Status Not Met          SLP Long Term Goals - 10/09/14 1459    SLP LONG TERM GOAL #1   Title pt will demo alternating attention in a simple cognitive communication task (looking at a chart and answering question) by having 85% success on the task with rare min A   Time 2  3 more visits   Period Weeks   Status Achieved  revised 09-05-14   SLP LONG TERM GOAL #2   Title pt will demonstrate emergent awareness in simple cognitive linguistic tasks by correcting errors 60% of opportunities   Time 2  3 more visits   Period  Weeks   Status Not Met  revised 09-05-14   SLP LONG TERM GOAL #3   Title pt will give appropriate solutions verbally to functional problems 95% of the time   Status Achieved   SLP LONG TERM GOAL #4   Title pt to use some kind of memory compensation system twice during a therapy session   Time 2  3 more visits   Period Weeks   Status On-going   SLP LONG TERM GOAL #5   Title Pt will alternate attention between 2 simple cogntive linguistic tasks with 90% accuracy and rare min A (goal set 10/09/14)   Time 4   Period Weeks   Status New   Additional Long Term Goals   Additional Long Term Goals Yes   SLP LONG TERM GOAL #6   Title Pt will demonsrate compensations for attention and recall deficits 1x a session with rare min A (goal set 10/09/14)   Time 4   Period Weeks   Status New   SLP LONG TERM GOAL #7   Title Pt will solve simple time, money, reasoning, organization cogntive linguistic tasks with 90% accuracy and occassional min A (goal set 10/09/14)   Time 4   Period Weeks   Status New          Plan - 10/11/14 1635    Clinical Impression Statement Pt with improved alternating attention on familiar tasks, with rare min A. Mr. Colberg continues to require usual min to mod cues for attention to details, emergent awareness of cognitive deficits and attention to details. Cont ST warranted to maximize attention, awareness and simple problem solving/reasoning for improved independence and to reduce caregiver burdent).   Speech Therapy Frequency 2x / week        Problem List Patient Active Problem List   Diagnosis Date Noted  . Alterations of sensations following CVA (cerebrovascular accident) 09/10/2014  . Atrial fibrillation 09/06/2014  . Left spastic hemiparesis 08/07/2014  . Left-sided neglect 08/07/2014  . Right middle cerebral artery stroke 08/01/2014  . Tobacco dependence   . Carotid artery occlusion with infarction   . History of ETOH abuse   . Hyperlipidemia   . Essential  hypertension, benign 10/11/2013    [facility] , [place], [place]  10/11/2014, 4:37 PM  [facility] [address] [place], [place], [address] Phone: [phone]   Fax:  [phone]

## 2014-10-11 NOTE — Therapy (Signed)
[facility] [address] [place], [place], [address] Phone: [phone]   Fax:  [phone]  Occupational Therapy Treatment  Patient Details  Name: [name] MRN: [id number] Date of Birth: [birth year] Referring Provider:  Barton Fanny, MD  Encounter Date: 10/11/2014      OT End of Session - 10/11/14 1545    Visit Number 19   Number of Visits 25   Date for OT Re-Evaluation 11/08/14   Authorization Type [employer]    Authorization - Visit Number 13   Authorization - Number of Visits 30   OT Start Time 1638   OT Stop Time 1530   OT Time Calculation (min) 45 min   Equipment Utilized During Treatment gripper   Activity Tolerance Patient tolerated treatment well   Behavior During Therapy WFL for tasks assessed/performed      Past Medical History  Diagnosis Date  . Arthritis   . Hypertension   . Hyperlipidemia   . Cor triatriatum     a. identified on TEE at time of stroke  . Paroxysmal atrial fibrillation     a. identified on LINQ 08/2014  . Stroke 07/25/14    a. s/p MDT ILR implant     Past Surgical History  Procedure Laterality Date  . Loop recorder implant  07/27/14    LOOP REVEAL LINQ [id number] - [id number]  . Tee without cardioversion N/A 07/27/2014    Procedure: TRANSESOPHAGEAL ECHOCARDIOGRAM (TEE);  Surgeon: Pixie Casino, MD;  Location: [facility];  Service: Cardiovascular;  Laterality: N/A;  . Ep implantable device N/A 07/27/2014    Procedure: Loop Recorder Insertion;  Surgeon: Evans Lance, MD;  Location: [facility];  Service: Cardiovascular;  Laterality: N/A;    There were no vitals filed for this visit.  Visit Diagnosis:  Inattention  Sensation alteration, late effect of cerebrovascular disease  Hemiparesis affecting nondominant side as late effect of cerebrovascular accident  Visual disturbance      Subjective Assessment - 10/11/14 1458    Subjective  I am so depressed - I just  can't get out and do nothing.   Pertinent History see snapshot   Patient Stated Goals return to gardening, independence with self care.   Currently in Pain? No/denies   Pain Score 0-No pain                      OT Treatments/Exercises (OP) - 10/11/14 0001    ADLs   Eating Practiced using bilateral hands to cut meat using fork and knife.  Patient distracted by act of cutting with right hand and therefore frequently dropped fork (stabilizer) in left hand.  Patient without appreciable hand function in left without visual attention to hand.    UB Dressing Patient able to don and doff shirt today using strategies from earlier session this week.     LB Dressing Patient able to tie shoe using both hands today and tighten laces sufficiently.  Patient still has difficulty tying show actually on his foot due to difficulty with vision, difficulty with forward bending, and less effective left hand use in this position.     Neurological Re-education Exercises   Other Exercises 1 hand gripper at lowest setting, to pick up and move 1 inch blocks. Patient successfully moved five blocks in a row, before dropping and losing attention to left hand.  Patient able to pick up 2-3 blocks and maintain in left hand.  In hand manipulation training  for left hand with 1 inch blocks.                  OT Education - 10/11/14 1543    Education provided Yes   Education Details offered resources for neuropsych or counseling support as patient frustrated with lack of autonomy.   Reviewed safety with cutting, drinking, driving, etc.     Person(s) Educated Patient   Methods Explanation   Comprehension Verbalized understanding          OT Short Term Goals - 10/09/14 1636    OT SHORT TERM GOAL #1   Title Patient will independently don socks   Status Achieved   OT SHORT TERM GOAL #2   Title Patient will demonstrate sufficient pincer grasp to tighten shoelace with left hand   Status Achieved   OT  SHORT TERM GOAL #3   Title Patient will visually attend to activity presented in midline for 10 minutes in minimally distracting environment   Status Achieved   OT [place] #4   Title Patient will demonstrate ability to reach left arm overhead (at least 140 degrees shoulder flexion) while upright to hit target and repeat 5 times in preparation for overhead reach / release or grasp   Status Achieved   OT SHORT TERM GOAL #5   Title Patient will prepare a simple cold snack following 2 step directives in minimally distracting environment   Status Partially Met           OT Long Term Goals - 10/09/14 1636    OT LONG TERM GOAL #1   Title Patient will dress himself with modified independence - 11/08/2014   Status On-going   OT LONG TERM GOAL #2   Title Patient will shower himself, including transfer into and out of tub with modified independence   Status On-going   OT LONG TERM GOAL #3   Status Achieved   OT LONG TERM GOAL #4   Title Patient will effectively use long handled gardeing tool to hoe/rake garden in standing using bilateral upper extremities   Status On-going   OT LONG TERM GOAL #5   Title Patient will demonstrate safe use of knife for cutting food at a meal, or during meal preparation.  9This goal requires effective use of two hands in this task)   Status On-going               Plan - 10/11/14 1546    Clinical Impression Statement Patient showing steady improvement overall with active movement and functional use of left hand.  Patient is dependent upon visual attention for any functional use of left hand.  Patient shows improvement with basic ADL, and desires increased autonomy.   Pt will benefit from skilled therapeutic intervention in order to improve on the following deficits (Retired) Decreased balance;Decreased cognition;Decreased coordination;Decreased endurance;Decreased knowledge of precautions;Decreased knowledge of use of DME;Decreased range of  motion;Decreased safety awareness;Impaired perceived functional ability;Increased edema;Decreased strength;Impaired sensation;Impaired tone;Impaired UE functional use;Impaired vision/preception   Rehab Potential Good   OT Frequency 2x / week   OT Duration 4 weeks   OT Treatment/Interventions Self-care/ADL training;Electrical Stimulation;Therapeutic exercise;Neuromuscular education;DME and/or AE instruction;Manual Therapy;Functional Mobility Training;Splinting;Therapeutic exercises;Therapeutic activities;Cognitive remediation/compensation;Visual/perceptual remediation/compensation;Patient/family education;Balance training   Plan left hand function, does best with repetition.  Attention to left.     OT Home Exercise Plan issued 09/06/14, simple functional use of LUE, added home activity to incorporate more particiaption in ADL   Consulted and Agree with Plan of Care Patient  Problem List Patient Active Problem List   Diagnosis Date Noted  . Alterations of sensations following CVA (cerebrovascular accident) 09/10/2014  . Atrial fibrillation 09/06/2014  . Left spastic hemiparesis 08/07/2014  . Left-sided neglect 08/07/2014  . Right middle cerebral artery stroke 08/01/2014  . Tobacco dependence   . Carotid artery occlusion with infarction   . History of ETOH abuse   . Hyperlipidemia   . Essential hypertension, benign 10/11/2013    Mariah Milling, OTR/L 10/11/2014, 3:51 PM  [facility] [address] [place], [place], [address] Phone: [phone]   Fax:  [phone]

## 2014-10-12 ENCOUNTER — Ambulatory Visit: Payer: BLUE CROSS/BLUE SHIELD | Admitting: Diagnostic Neuroimaging

## 2014-10-15 ENCOUNTER — Telehealth: Payer: Self-pay | Admitting: Internal Medicine

## 2014-10-15 ENCOUNTER — Telehealth: Payer: Self-pay | Admitting: *Deleted

## 2014-10-15 NOTE — Telephone Encounter (Signed)
New problem    Pt is having nose bleeds and don't know why, want to know if its normal or possible think it may be coming from his blood thinner. Please advise pt.

## 2014-10-16 ENCOUNTER — Ambulatory Visit: Payer: BLUE CROSS/BLUE SHIELD | Admitting: Speech Pathology

## 2014-10-16 DIAGNOSIS — R41841 Cognitive communication deficit: Secondary | ICD-10-CM | POA: Diagnosis not present

## 2014-10-16 NOTE — Therapy (Signed)
[facility] [address] [place], [place], [address] Phone: [phone]   Fax:  [phone]  Speech Language Pathology Treatment  Patient Details  Name: [name] MRN: [id number] Date of Birth: [birth year] Referring Provider:  Harrison Mons, PA-C  Encounter Date: 10/16/2014      End of Session - 10/16/14 1142    Visit Number 17   Number of Visits 24   Date for SLP Re-Evaluation 11/06/14   Authorization - Visit Number 43   Authorization - Number of Visits 30   SLP Start Time 6761   SLP Stop Time  1143   SLP Time Calculation (min) 41 min   Activity Tolerance Patient tolerated treatment well      Past Medical History  Diagnosis Date  . Arthritis   . Hypertension   . Hyperlipidemia   . Cor triatriatum     a. identified on TEE at time of stroke  . Paroxysmal atrial fibrillation     a. identified on LINQ 08/2014  . Stroke 07/25/14    a. s/p MDT ILR implant     Past Surgical History  Procedure Laterality Date  . Loop recorder implant  07/27/14    LOOP REVEAL LINQ [id number] - [id number]  . Tee without cardioversion N/A 07/27/2014    Procedure: TRANSESOPHAGEAL ECHOCARDIOGRAM (TEE);  Surgeon: Pixie Casino, MD;  Location: [facility];  Service: Cardiovascular;  Laterality: N/A;  . Ep implantable device N/A 07/27/2014    Procedure: Loop Recorder Insertion;  Surgeon: Evans Lance, MD;  Location: [facility];  Service: Cardiovascular;  Laterality: N/A;    There were no vitals filed for this visit.  Visit Diagnosis: Cognitive communication deficit      Subjective Assessment - 10/16/14 1106    Subjective "I didn't  do my homework yet - I don't see [name] until tomorrow"   Currently in Pain? No/denies               ADULT SLP TREATMENT - 10/16/14 1107    General Information   Behavior/Cognition Cooperative;Pleasant mood;Distractible;Alert   Treatment Provided   Treatment provided  Cognitive-Linquistic   Pain Assessment   Pain Assessment No/denies pain   Cognitive-Linquistic Treatment   Treatment focused on Cognition   Skilled Treatment Pt verbalized attention/"focus" with supervision cues. I further instrucuted pt re: alternating his focus and focus on details. Facilitaed alternating attention and attention to detail reading schedule for movies and answering detail questions with 70% accuracy and usual min to mod A for attention to detail. Alternate attention between looking up dates in calendar and recording day and date and ordering holidays with  usual min A to  reocrd data accurately and include day and date, as pt would write date and year, but not month. Pt also wriote year as "216" despite cues. Pt also requried cues to organize dates in a consistent manner, separating day, date and year, instead of writing it as a continuous number.    Assessment / Recommendations / Plan   Plan Continue with current plan of care   Progression Toward Goals   Progression toward goals Progressing toward goals            SLP Short Term Goals - 09/17/14 1249    SLP SHORT TERM GOAL #1   Title pt will attend to 10 minute cognitive communication task in a mod distracting environment with rare min A back to task   Status Not Met   SLP SHORT  TERM GOAL #2   Title pt will complete simple verbal time, money, and math problems 75% success and rare min A   Status Not Met   SLP SHORT TERM GOAL #3   Title pt will demonstrate intellectual awareness to cognitive communicaiton deficits by telling SLP 3 non-physical deficits   Status Not Met          SLP Long Term Goals - 10/16/14 1142    SLP LONG TERM GOAL #1   Title pt will demo alternating attention in a simple cognitive communication task (looking at a chart and answering question) by having 85% success on the task with rare min A   Time 2  3 more visits   Period Weeks   Status Achieved  revised 09-05-14   SLP LONG TERM GOAL #2    Title pt will demonstrate emergent awareness in simple cognitive linguistic tasks by correcting errors 60% of opportunities   Time 2  3 more visits   Period Weeks   Status Not Met  revised 09-05-14   SLP LONG TERM GOAL #3   Title pt will give appropriate solutions verbally to functional problems 95% of the time   Status Achieved   SLP LONG TERM GOAL #4   Title pt to use some kind of memory compensation system twice during a therapy session   Time 2  3 more visits   Period Weeks   Status On-going   SLP LONG TERM GOAL #5   Title Pt will alternate attention between 2 simple cogntive linguistic tasks with 90% accuracy and rare min A (goal set 10/09/14)   Time 3   Period Weeks   Status On-going   SLP LONG TERM GOAL #6   Title Pt will demonsrate compensations for attention and recall deficits 1x a session with rare min A (goal set 10/09/14)   Time 3   Period Weeks   Status On-going   SLP LONG TERM GOAL #7   Title Pt will solve simple time, money, reasoning, organization cogntive linguistic tasks with 90% accuracy and occassional min A (goal set 10/09/14)   Time 3   Period Weeks   Status On-going          Plan - 10/16/14 1138    Clinical Impression Statement Continue skilled ST to maximize attention to detail, error awareness and simple reasoning for improved independence and to reduce caregiver burden   Speech Therapy Frequency 2x / week   Duration --  3 weeks   Treatment/Interventions Compensatory techniques;Functional tasks;Patient/family education;SLP instruction and feedback;Cognitive reorganization;Environmental controls;Internal/external aids   Potential to Achieve Goals Good   Potential Considerations Severity of impairments;Ability to learn/carryover information   Consulted and Agree with Plan of Care Patient        Problem List Patient Active Problem List   Diagnosis Date Noted  . Alterations of sensations following CVA (cerebrovascular accident) 09/10/2014  .  Atrial fibrillation 09/06/2014  . Left spastic hemiparesis 08/07/2014  . Left-sided neglect 08/07/2014  . Right middle cerebral artery stroke 08/01/2014  . Tobacco dependence   . Carotid artery occlusion with infarction   . History of ETOH abuse   . Hyperlipidemia   . Essential hypertension, benign 10/11/2013    Lovvorn, Annye Rusk MS, CCC-SLP 10/16/2014, 11:43 AM  [facility] [address] [place], [place], [address] Phone: [phone]   Fax:  [phone]

## 2014-10-16 NOTE — Patient Instructions (Signed)
Complete homework assigned prior session

## 2014-10-16 NOTE — Telephone Encounter (Signed)
Spoke with wife last night.  She is going to get saline nasal spray and use to help with dryness.  She will also get him some Vaseline to use to help

## 2014-10-17 ENCOUNTER — Telehealth: Payer: Self-pay

## 2014-10-17 ENCOUNTER — Encounter: Payer: Self-pay | Admitting: Internal Medicine

## 2014-10-17 MED ORDER — LISINOPRIL 5 MG PO TABS
5.0000 mg | ORAL_TABLET | Freq: Every day | ORAL | Status: DC
Start: 2014-10-17 — End: 2017-05-12

## 2014-10-17 NOTE — Telephone Encounter (Signed)
Rx sent 

## 2014-10-17 NOTE — Telephone Encounter (Signed)
Pt has changed to a mail order pharmacy and they have all his meds but lisinopril and that rx needs to be faxed to [phone]

## 2014-10-18 ENCOUNTER — Encounter: Payer: Self-pay | Admitting: Internal Medicine

## 2014-10-19 ENCOUNTER — Ambulatory Visit: Payer: BLUE CROSS/BLUE SHIELD | Attending: Physical Medicine & Rehabilitation

## 2014-10-19 DIAGNOSIS — R41840 Attention and concentration deficit: Secondary | ICD-10-CM | POA: Diagnosis present

## 2014-10-19 DIAGNOSIS — R41841 Cognitive communication deficit: Secondary | ICD-10-CM | POA: Insufficient documentation

## 2014-10-19 DIAGNOSIS — I69898 Other sequelae of other cerebrovascular disease: Secondary | ICD-10-CM | POA: Insufficient documentation

## 2014-10-19 DIAGNOSIS — R4189 Other symptoms and signs involving cognitive functions and awareness: Secondary | ICD-10-CM | POA: Insufficient documentation

## 2014-10-19 DIAGNOSIS — R208 Other disturbances of skin sensation: Secondary | ICD-10-CM | POA: Insufficient documentation

## 2014-10-19 DIAGNOSIS — I69859 Hemiplegia and hemiparesis following other cerebrovascular disease affecting unspecified side: Secondary | ICD-10-CM | POA: Diagnosis present

## 2014-10-19 NOTE — Therapy (Signed)
[facility] [address] [place], [place], [address] Phone: [phone]   Fax:  [phone]  Speech Language Pathology Treatment  Patient Details  Name: [name] MRN: [id number] Date of Birth: 01-01-1956 Referring Provider:  Harrison Mons, PA-C  Encounter Date: 10/19/2014      End of Session - 10/19/14 1336    Visit Number 18   Number of Visits 24   Date for SLP Re-Evaluation 11/06/14   Authorization - Visit Number 44   Authorization - Number of Visits 30   SLP Start Time 1017   SLP Stop Time  1100   SLP Time Calculation (min) 43 min   Activity Tolerance Patient tolerated treatment well      Past Medical History  Diagnosis Date  . Arthritis   . Hypertension   . Hyperlipidemia   . Cor triatriatum     a. identified on TEE at time of stroke  . Paroxysmal atrial fibrillation     a. identified on LINQ 08/2014  . Stroke 07/25/14    a. s/p MDT ILR implant     Past Surgical History  Procedure Laterality Date  . Loop recorder implant  07/27/14    LOOP REVEAL LINQ OEU23 - [id number]  . Tee without cardioversion N/A 07/27/2014    Procedure: TRANSESOPHAGEAL ECHOCARDIOGRAM (TEE);  Surgeon: Pixie Casino, MD;  Location: [facility];  Service: Cardiovascular;  Laterality: N/A;  . Ep implantable device N/A 07/27/2014    Procedure: Loop Recorder Insertion;  Surgeon: Evans Lance, MD;  Location: [facility];  Service: Cardiovascular;  Laterality: N/A;    There were no vitals filed for this visit.  Visit Diagnosis: Cognitive communication deficit      Subjective Assessment - 10/19/14 1030    Subjective Pt arrived 10 minutes late to tx.               ADULT SLP TREATMENT - 10/19/14 1031    General Information   Behavior/Cognition Cooperative;Pleasant mood;Distractible;Alert   Treatment Provided   Treatment provided Cognitive-Linquistic   Pain Assessment   Pain Assessment No/denies pain    Cognitive-Linquistic Treatment   Treatment focused on Cognition   Skilled Treatment In reading homework answers to SLP decr'd alternating attention occasionally noted due to pt skipping responses after short conversation with SLP (in order to distract pt). He told SLP "memory" for deficit areas. SLP agreed he has dificulty with focus once cued by SLP. Pt disagreed  that organization was a deficit area.    Assessment / Recommendations / Plan   Plan Continue with current plan of care   Progression Toward Goals   Progression toward goals Progressing toward goals          SLP Education - 10/19/14 1336    Education provided Yes   Education Details awareness of deficits   Person(s) Educated Patient   Methods Explanation;Demonstration   Comprehension Verbalized understanding          SLP Short Term Goals - 09/17/14 1249    SLP SHORT TERM GOAL #1   Title pt will attend to 10 minute cognitive communication task in a mod distracting environment with rare min A back to task   Status Not Met   SLP SHORT TERM GOAL #2   Title pt will complete simple verbal time, money, and math problems 75% success and rare min A   Status Not Met   SLP SHORT TERM GOAL #3   Title pt will demonstrate  intellectual awareness to cognitive communicaiton deficits by telling SLP 3 non-physical deficits   Status Not Met          SLP Long Term Goals - 10/19/14 1045    SLP LONG TERM GOAL #1   Title pt will demo alternating attention in a simple cognitive communication task (looking at a chart and answering question) by having 85% success on the task with rare min A   Time --   Period --   Status Achieved  revised 09-05-14   SLP LONG TERM GOAL #2   Title pt will demonstrate emergent awareness in simple cognitive linguistic tasks by correcting errors 60% of opportunities   Time 2  2 more visits   Period Weeks   Status Not Met  revised 09-05-14   SLP LONG TERM GOAL #3   Title pt will give appropriate solutions  verbally to functional problems 95% of the time   Status Achieved   SLP LONG TERM GOAL #4   Title pt to use some kind of memory compensation system twice during a therapy session   Time 2   Period Weeks   Status On-going   SLP LONG TERM GOAL #5   Title Pt will alternate attention between 2 simple cogntive linguistic tasks with 90% accuracy and rare min A (goal set 10/09/14)   Time 2   Period Weeks   Status On-going   SLP LONG TERM GOAL #6   Title Pt will demonsrate compensations for attention and recall deficits 1x a session with rare min A (goal set 10/09/14)   Time 2   Period Weeks   Status On-going   SLP LONG TERM GOAL #7   Title Pt will solve simple time, money, reasoning, organization cogntive linguistic tasks with 90% accuracy and occassional min A (goal set 10/09/14)   Time 2   Period Weeks   Status On-going          Plan - 10/19/14 1337    Clinical Impression Statement Continue skilled ST necessary to maximize attention to detail, error awareness and simple reasoning for improve independence and to reduce caregiver burden   Speech Therapy Frequency 2x / week   Duration --  3 weeks   Treatment/Interventions Compensatory techniques;Functional tasks;Patient/family education;SLP instruction and feedback;Cognitive reorganization;Environmental controls;Internal/external aids   Potential to Achieve Goals Good   Potential Considerations Severity of impairments;Ability to learn/carryover information        Problem List Patient Active Problem List   Diagnosis Date Noted  . Alterations of sensations following CVA (cerebrovascular accident) 09/10/2014  . Atrial fibrillation 09/06/2014  . Left spastic hemiparesis 08/07/2014  . Left-sided neglect 08/07/2014  . Right middle cerebral artery stroke 08/01/2014  . Tobacco dependence   . Carotid artery occlusion with infarction   . History of ETOH abuse   . Hyperlipidemia   . Essential hypertension, benign 10/11/2013     [facility] , MS, [place]  10/19/2014, 1:39 PM  [facility] [address] [place], [place], [address] Phone: [phone]   Fax:  [phone]

## 2014-10-19 NOTE — Patient Instructions (Signed)
  Please complete the assigned speech therapy homework and return it to your next session.  

## 2014-10-23 ENCOUNTER — Ambulatory Visit: Payer: BLUE CROSS/BLUE SHIELD | Admitting: Occupational Therapy

## 2014-10-23 ENCOUNTER — Ambulatory Visit (HOSPITAL_BASED_OUTPATIENT_CLINIC_OR_DEPARTMENT_OTHER): Payer: BLUE CROSS/BLUE SHIELD | Admitting: Physical Medicine & Rehabilitation

## 2014-10-23 ENCOUNTER — Encounter: Payer: Self-pay | Admitting: Physical Medicine & Rehabilitation

## 2014-10-23 ENCOUNTER — Encounter: Payer: Self-pay | Admitting: Occupational Therapy

## 2014-10-23 ENCOUNTER — Encounter: Payer: BLUE CROSS/BLUE SHIELD | Attending: Physical Medicine & Rehabilitation

## 2014-10-23 ENCOUNTER — Ambulatory Visit: Payer: BLUE CROSS/BLUE SHIELD | Admitting: Speech Pathology

## 2014-10-23 VITALS — BP 97/63 | HR 75 | Resp 14

## 2014-10-23 DIAGNOSIS — I69998 Other sequelae following unspecified cerebrovascular disease: Secondary | ICD-10-CM

## 2014-10-23 DIAGNOSIS — I69354 Hemiplegia and hemiparesis following cerebral infarction affecting left non-dominant side: Secondary | ICD-10-CM | POA: Diagnosis present

## 2014-10-23 DIAGNOSIS — R209 Unspecified disturbances of skin sensation: Principal | ICD-10-CM

## 2014-10-23 DIAGNOSIS — R41841 Cognitive communication deficit: Secondary | ICD-10-CM | POA: Diagnosis not present

## 2014-10-23 DIAGNOSIS — M25512 Pain in left shoulder: Secondary | ICD-10-CM | POA: Diagnosis not present

## 2014-10-23 DIAGNOSIS — M1990 Unspecified osteoarthritis, unspecified site: Secondary | ICD-10-CM | POA: Diagnosis not present

## 2014-10-23 DIAGNOSIS — G8110 Spastic hemiplegia affecting unspecified side: Secondary | ICD-10-CM

## 2014-10-23 DIAGNOSIS — I10 Essential (primary) hypertension: Secondary | ICD-10-CM | POA: Diagnosis not present

## 2014-10-23 DIAGNOSIS — F17200 Nicotine dependence, unspecified, uncomplicated: Secondary | ICD-10-CM | POA: Diagnosis not present

## 2014-10-23 DIAGNOSIS — R41840 Attention and concentration deficit: Secondary | ICD-10-CM

## 2014-10-23 DIAGNOSIS — I69359 Hemiplegia and hemiparesis following cerebral infarction affecting unspecified side: Secondary | ICD-10-CM

## 2014-10-23 DIAGNOSIS — G8114 Spastic hemiplegia affecting left nondominant side: Secondary | ICD-10-CM

## 2014-10-23 DIAGNOSIS — E785 Hyperlipidemia, unspecified: Secondary | ICD-10-CM | POA: Insufficient documentation

## 2014-10-23 NOTE — Patient Instructions (Signed)
Homework provided 

## 2014-10-23 NOTE — Progress Notes (Signed)
Subjective:    Patient ID: [name], male    DOB: 1955/05/04, 59 y.o.   MRN: [id number]  HPI 59 year old male with right MCA distribution infarct with left hemiparesis onset 07/25/2014.  Patient went through inpatient rehabilitation as well as currently attending outpatient OT and speech therapy. His primary complaint today is left shoulder pain which started about 2 weeks ago. No fall or injury to that area. His past medical history significant for right shoulder pain he used to do a lot of overhead activities at work.  His left shoulder pain is keeping him up at night. Pain increases with overhead Pain is in the anterior shoulder but also radiates to the collar bone. No neck pain.    Pain Inventory Average Pain 9 Pain Right Now 9 My pain is sharp, burning and tingling  In the last 24 hours, has pain interfered with the following? General activity 9 Relation with others no selection Enjoyment of life no selection What TIME of day is your pain at its worst? night Sleep (in general) Fair  Pain is worse with: sleeping Pain improves with: nothing Relief from Meds: 0  Mobility walk without assistance how many minutes can you walk? 30 ability to climb steps?  yes do you drive?  no  Function disabled: date disabled .  Neuro/Psych No problems in this area  Prior Studies Any changes since last visit?  no  Physicians involved in your care Any changes since last visit?  no   Family History  Problem Relation Age of Onset  . Cancer Father     leukemia  . Heart attack Brother   . Hypertension Brother    Social History   Social History  . Marital Status: Married    Spouse Name: Marylu Lund  . Number of Children: 3  . Years of Education: 9   Occupational History  . meat cutter    Social History Main Topics  . Smoking status: Current Every Day Smoker -- 0.50 packs/day for 40 years    Types: Cigarettes  . Smokeless tobacco: None     Comment: 10/03/14 currently  smoking 1 cig a day  . Alcohol Use: 10.8 oz/week    18 Cans of beer per week     Comment: 10/03/14 one beer every week or two  . Drug Use: No  . Sexual Activity: Yes   Other Topics Concern  . None   Social History Narrative   Lives at home with wife, Marylu Lund   Caffeine use - tea 4-5 glasses a day   Past Surgical History  Procedure Laterality Date  . Loop recorder implant  07/27/14    LOOP REVEAL LINQ ION62 - [id number]  . Tee without cardioversion N/A 07/27/2014    Procedure: TRANSESOPHAGEAL ECHOCARDIOGRAM (TEE);  Surgeon: Chrystie Nose, MD;  Location: [facility]OS[facility];  Service: Cardiovascular;  Laterality: N/A;  . Ep implantable device N/A 07/27/2014    Procedure: Loop Recorder Insertion;  Surgeon: Marinus Maw, MD;  Location: [facility];  Service: Cardiovascular;  Laterality: N/A;   Past Medical History  Diagnosis Date  . Arthritis   . Hypertension   . Hyperlipidemia   . Cor triatriatum     a. identified on TEE at time of stroke  . Paroxysmal atrial fibrillation     a. identified on LINQ 08/2014  . Stroke 07/25/14    a. s/p MDT ILR implant    BP 97/63 mmHg  Pulse 75  Resp 14  SpO2 97%  Opioid Risk Score:   Fall Risk Score:  `1  Depression screen PHQ 2/9  Depression screen [place] Day 2/9 09/10/2014 09/06/2014 05/10/2014 01/05/2014  Decreased Interest 3 0 0 0  Down, Depressed, Hopeless 1 1 0 0  PHQ - 2 Score 4 1 0 0  Altered sleeping 1 - - -  Tired, decreased energy 1 - - -  Change in appetite 0 - - -  Feeling bad or failure about yourself  1 - - -  Trouble concentrating 0 - - -  Moving slowly or fidgety/restless 1 - - -  Suicidal thoughts 0 - - -  PHQ-9 Score 8 - - -     Review of Systems  All other systems reviewed and are negative.      Objective:   Physical Exam  Right shoulder full range of motion without pain Left shoulder positive Hawkins maneuver No pain to palpation along the left shoulder. There is tenderness along the proximal biceps tendon on the  left side. Motor strength is 4 minus at the left deltoid, bicep, tricep 3 minus at the finger flexors and extensors on the left side. Right-sided strength is 5/5 in the deltoid, biceps, triceps, grip Lower extremity strength is 5/5 bilateral hip flexor and extensor ankle dorsiflexor Patient has evidence of left neglect on confrontation testing.       Assessment & Plan:  1. Right MCA infarct with left hemiparesis mainly affecting left upper extremity as well as causing left neglect.  2. Left hemiplegic shoulder pain. He has positive impingement signs indicating some evidence of subacromial impingement versus bursitis. Also does have some pain along the bicipital groove.  Will do injection of the left shoulder subacromial area.  Shoulder injection Left    Indication:Left Shoulder pain not relieved by medication management and other conservative care.  Informed consent was obtained after describing risks and benefits of the procedure with the patient, this includes bleeding, bruising, infection and medication side effects. The patient wishes to proceed and has given written consent. Patient was placed in a seated position. TheLeft shoulder was marked and prepped with betadine in the subacromial area. A 25-gauge 1-1/2 inch needle was inserted into the subacromial area. After negative draw back for blood, a solution containing 1 mL of 6 mg per ML betamethasone and 4 mL of 1% lidocaine was injected. A band aid was applied. The patient tolerated the procedure well. Post procedure instructions were given. If no improvement would do ultrasound guided left bicipital tendon sheath injection  Continue outpatient OT and speech

## 2014-10-23 NOTE — Telephone Encounter (Signed)
error 

## 2014-10-23 NOTE — Patient Instructions (Signed)
Joint Injection  Care After  Refer to this sheet in the next few days. These instructions provide you with information on caring for yourself after you have had a joint injection. Your caregiver also may give you more specific instructions. Your treatment has been planned according to current medical practices, but problems sometimes occur. Call your caregiver if you have any problems or questions after your procedure.  After any type of joint injection, it is not uncommon to experience:  · Soreness, swelling, or bruising around the injection site.  · Mild numbness, tingling, or weakness around the injection site caused by the numbing medicine used before or with the injection.  It also is possible to experience the following effects associated with the specific agent after injection:  · Iodine-based contrast agents:  ¨ Allergic reaction (itching, hives, widespread redness, and swelling beyond the injection site).  · Corticosteroids (These effects are rare.):  ¨ Allergic reaction.  ¨ Increased blood sugar levels (If you have diabetes and you notice that your blood sugar levels have increased, notify your caregiver).  ¨ Increased blood pressure levels.  ¨ Mood swings.  · Hyaluronic acid in the use of viscosupplementation.  ¨ Temporary heat or redness.  ¨ Temporary rash and itching.  ¨ Increased fluid accumulation in the injected joint.  These effects all should resolve within a day after your procedure.   HOME CARE INSTRUCTIONS  · Limit yourself to light activity the day of your procedure. Avoid lifting heavy objects, bending, stooping, or twisting.  · Take prescription or over-the-counter pain medication as directed by your caregiver.  · You may apply ice to your injection site to reduce pain and swelling the day of your procedure. Ice may be applied 03-04 times:  ¨ Put ice in a plastic bag.  ¨ Place a towel between your skin and the bag.  ¨ Leave the ice on for no longer than 15-20 minutes each time.  SEEK  IMMEDIATE MEDICAL CARE IF:   · Pain and swelling get worse rather than better or extend beyond the injection site.  · Numbness does not go away.  · Blood or fluid continues to leak from the injection site.  · You have chest pain.  · You have swelling of your face or tongue.  · You have trouble breathing or you become dizzy.  · You develop a fever, chills, or severe tenderness at the injection site that last longer than 1 day.  MAKE SURE YOU:  · Understand these instructions.  · Watch your condition.  · Get help right away if you are not doing well or if you get worse.  Document Released: 10/16/2010 Document Revised: 04/27/2011 Document Reviewed: 10/16/2010  ExitCare® Patient Information ©2015 [employer]. This information is not intended to replace advice given to you by your health care provider. Make sure you discuss any questions you have with your health care provider.

## 2014-10-23 NOTE — Therapy (Signed)
[facility] [address] [place], [place], [address] Phone: [phone]   Fax:  [phone]  Occupational Therapy Treatment  Patient Details  Name: [name] MRN: [id number] Date of Birth: 07-May-1955 Referring Provider:  Harrison Mons, PA-C  Encounter Date: 10/23/2014      OT End of Session - 10/23/14 1418    Visit Number 20   Number of Visits 25   Date for OT Re-Evaluation 11/08/14   Authorization Type [employer]    Authorization - Visit Number 20   Authorization - Number of Visits 30   OT Start Time 7169   OT Stop Time 1400   OT Time Calculation (min) 45 min   Equipment Utilized During Treatment gripper   Activity Tolerance Patient tolerated treatment well   Behavior During Therapy [facility] for tasks assessed/performed      Past Medical History  Diagnosis Date  . Arthritis   . Hypertension   . Hyperlipidemia   . Cor triatriatum     a. identified on TEE at time of stroke  . Paroxysmal atrial fibrillation     a. identified on LINQ 08/2014  . Stroke 07/25/14    a. s/p MDT ILR implant     Past Surgical History  Procedure Laterality Date  . Loop recorder implant  07/27/14    LOOP REVEAL LINQ CVE93 - [id number]  . Tee without cardioversion N/A 07/27/2014    Procedure: TRANSESOPHAGEAL ECHOCARDIOGRAM (TEE);  Surgeon: Pixie Casino, MD;  Location: [facility];  Service: Cardiovascular;  Laterality: N/A;  . Ep implantable device N/A 07/27/2014    Procedure: Loop Recorder Insertion;  Surgeon: Evans Lance, MD;  Location: [facility];  Service: Cardiovascular;  Laterality: N/A;    There were no vitals filed for this visit.  Visit Diagnosis:  Sensation alteration, late effect of cerebrovascular disease  Hemiparesis affecting nondominant side as late effect of cerebrovascular accident  Inattention      Subjective Assessment - 10/23/14 1322    Subjective  patient reporting 5/10 pain in left shoulder.  Dr  Letta Pate gave an injection of cortisone this am.   Pertinent History see snapshot   Patient Stated Goals return to gardening, independence with self care.   Currently in Pain? Yes   Pain Score 5    Pain Location Shoulder   Pain Orientation Left   Pain Descriptors / Indicators Sharp   Pain Type Acute pain  possible bursitis   Pain Onset More than a month ago   Pain Frequency Intermittent   Aggravating Factors  sleeping - difficult positioning for comfort   Pain Relieving Factors movement - gentle   Effect of Pain on Daily Activities limits sleep   Multiple Pain Sites No                      OT Treatments/Exercises (OP) - 10/23/14 0001    ADLs   UB Dressing Patient again able to doff and don sleeveless tshirt using previously taught strategies effectively.   LB Dressing Patient reports he is dressing himself  with the exception of tying his shoes.  Patient able to tie his left shoe while on his left foot today for the first time.  Patient easily frustrated - yet able to use therapeutic cues to help effectively use left hand in this task.   Fine Motor Coordination   Other Fine Motor Exercises Practiced picking up and manipulating golf tees into peg board.  Patient had  difficulty managing this size, but with increased focus, able to successfully complete.   Manual Therapy   Manual Therapy Edema management;Joint mobilization   Edema Management Patient with edematous left hand today.  Completed edema massage to palm/dorsum of hand and digits.  Individual range of motion in each digit for edema control.  Patient able to repeat this session - will need to recheck at subsequent appointments.     Joint Mobilization Joint mobilization to PIP, MCP especially 2-4 digits.  Patient with report of pain at PIP flexion - end range in index finger.                  OT Education - 10/23/14 1417    Education provided Yes   Education Details edema massage, bed positioning to reduce  shoulder pain   Person(s) Educated Patient   Methods Explanation;Demonstration   Comprehension Verbalized understanding;Returned demonstration          OT Short Term Goals - 10/09/14 1636    OT SHORT TERM GOAL #1   Title Patient will independently don socks   Status Achieved   OT SHORT TERM GOAL #2   Title Patient will demonstrate sufficient pincer grasp to tighten shoelace with left hand   Status Achieved   OT SHORT TERM GOAL #3   Title Patient will visually attend to activity presented in midline for 10 minutes in minimally distracting environment   Status Achieved   OT [place] #4   Title Patient will demonstrate ability to reach left arm overhead (at least 140 degrees shoulder flexion) while upright to hit target and repeat 5 times in preparation for overhead reach / release or grasp   Status Achieved   OT SHORT TERM GOAL #5   Title Patient will prepare a simple cold snack following 2 step directives in minimally distracting environment   Status Partially Met           OT Long Term Goals - 10/23/14 1421    OT LONG TERM GOAL #1   Title Patient will dress himself with modified independence - 11/08/2014   Status On-going   OT LONG TERM GOAL #2   Title Patient will shower himself, including transfer into and out of tub with modified independence   Status Achieved   OT LONG TERM GOAL #3   Title Patient will complete grooming tasks independently, using visual cues for thoroughness (eg - shaving using mirror for guidance)   Status Achieved   OT LONG TERM GOAL #4   Title Patient will effectively use long handled gardeing tool to hoe/rake garden in standing using bilateral upper extremities   Status Achieved   OT LONG TERM GOAL #5   Title Patient will demonstrate safe use of knife for cutting food at a meal, or during meal preparation.  9This goal requires effective use of two hands in this task)   Status On-going               Plan - 10/23/14 1418     Clinical Impression Statement Patient reports increased activity at home recently, working in garden, and taking over more independence with ADL.  Patient with new report of shoulder pain in left shoulder, mostly troublesome at night.  Continue toward OT long term goals   Pt will benefit from skilled therapeutic intervention in order to improve on the following deficits (Retired) Decreased balance;Decreased cognition;Decreased coordination;Decreased endurance;Decreased knowledge of precautions;Decreased knowledge of use of DME;Decreased range of motion;Decreased safety awareness;Impaired perceived functional  ability;Increased edema;Decreased strength;Impaired sensation;Impaired tone;Impaired UE functional use;Impaired vision/preception   Rehab Potential Good   OT Frequency 2x / week   OT Duration 4 weeks   OT Treatment/Interventions Self-care/ADL training;Electrical Stimulation;Therapeutic exercise;Neuromuscular education;DME and/or AE instruction;Manual Therapy;Functional Mobility Training;Splinting;Therapeutic exercises;Therapeutic activities;Cognitive remediation/compensation;Visual/perceptual remediation/compensation;Patient/family education;Balance training   OT Home Exercise Plan issued 09/06/14, simple functional use of LUE, added home activity to incorporate more particiaption in ADL, reviewed bed positioning for shoulder support, and edema massage left hand   Consulted and Agree with Plan of Care Patient        Problem List Patient Active Problem List   Diagnosis Date Noted  . Left anterior shoulder pain 10/23/2014  . Alterations of sensations following CVA (cerebrovascular accident) 09/10/2014  . Atrial fibrillation 09/06/2014  . Left spastic hemiparesis 08/07/2014  . Left-sided neglect 08/07/2014  . Right middle cerebral artery stroke 08/01/2014  . Tobacco dependence   . Carotid artery occlusion with infarction   . History of ETOH abuse   . Hyperlipidemia   . Essential  hypertension, benign 10/11/2013    Mariah Milling, OTR/L 10/23/2014, 2:23 PM  [facility] [address] [place], [place], [address] Phone: [phone]   Fax:  [phone]

## 2014-10-23 NOTE — Therapy (Signed)
[facility] [address] [place], [place], [address] Phone: [phone]   Fax:  [phone]  Speech Language Pathology Treatment  Patient Details  Name: [name] MRN: [id number] Date of Birth: February 09, 1956 Referring Provider:  Harrison Mons, PA-C  Encounter Date: 10/23/2014      End of Session - 10/23/14 1455    Visit Number 19   Number of Visits 24   Date for SLP Re-Evaluation 11/06/14   Authorization - Visit Number 74   Authorization - Number of Visits 30   SLP Start Time 1638   SLP Stop Time  1448   SLP Time Calculation (min) 44 min   Activity Tolerance Patient tolerated treatment well      Past Medical History  Diagnosis Date  . Arthritis   . Hypertension   . Hyperlipidemia   . Cor triatriatum     a. identified on TEE at time of stroke  . Paroxysmal atrial fibrillation     a. identified on LINQ 08/2014  . Stroke 07/25/14    a. s/p MDT ILR implant     Past Surgical History  Procedure Laterality Date  . Loop recorder implant  07/27/14    LOOP REVEAL LINQ [id number] - [id number]  . Tee without cardioversion N/A 07/27/2014    Procedure: TRANSESOPHAGEAL ECHOCARDIOGRAM (TEE);  Surgeon: Pixie Casino, MD;  Location: [facility];  Service: Cardiovascular;  Laterality: N/A;  . Ep implantable device N/A 07/27/2014    Procedure: Loop Recorder Insertion;  Surgeon: Evans Lance, MD;  Location: [facility];  Service: Cardiovascular;  Laterality: N/A;    There were no vitals filed for this visit.  Visit Diagnosis: Cognitive communication deficit      Subjective Assessment - 10/23/14 1409    Subjective "I've been needing a smoke for my nerves - I didn't have homework from [facility]"               ADULT SLP TREATMENT - 10/23/14 1410    General Information   Behavior/Cognition Cooperative;Pleasant mood;Distractible;Alert   Treatment Provided   Treatment provided Cognitive-Linquistic   Pain Assessment    Pain Assessment 0-10   Pain Score 5    Pain Location Left shoulder   Pain Descriptors / Indicators Aching   Pain Intervention(s) Monitored during session   Cognitive-Linquistic Treatment   Treatment focused on Cognition   Skilled Treatment Facilitated organization and reasoning with simple deduction puzzles with usual mod A. Pt solved simple linguistic money problem solving with occassional min A and 85% accurate. Pt alternated attention between reading  fee chart and  answering?'s re chart with occassional min A (visual and verbal cues) - 85% correct. Organization of information and attention to detail placing holidays in chronological order with usual min to mod A to attend to correct month and for months with more than 1 holiday.    Assessment / Recommendations / Plan   Plan Continue with current plan of care   Progression Toward Goals   Progression toward goals Progressing toward goals            SLP Short Term Goals - 09/17/14 1249    SLP SHORT TERM GOAL #1   Title pt will attend to 10 minute cognitive communication task in a mod distracting environment with rare min A back to task   Status Not Met   SLP SHORT TERM GOAL #2   Title pt will complete simple verbal time, money, and math problems 75% success  and rare min A   Status Not Met   SLP SHORT TERM GOAL #3   Title pt will demonstrate intellectual awareness to cognitive communicaiton deficits by telling SLP 3 non-physical deficits   Status Not Met          SLP Long Term Goals - 10/23/14 1454    SLP LONG TERM GOAL #1   Title pt will demo alternating attention in a simple cognitive communication task (looking at a chart and answering question) by having 85% success on the task with rare min A   Status Achieved  revised 09-05-14   SLP LONG TERM GOAL #2   Title pt will demonstrate emergent awareness in simple cognitive linguistic tasks by correcting errors 60% of opportunities   Time 2  2 more visits   Period Weeks    Status Not Met  revised 09-05-14   SLP LONG TERM GOAL #3   Title pt will give appropriate solutions verbally to functional problems 95% of the time   Status Achieved   SLP LONG TERM GOAL #4   Title pt to use some kind of memory compensation system twice during a therapy session   Time 2   Period Weeks   Status On-going   SLP LONG TERM GOAL #5   Title Pt will alternate attention between 2 simple cogntive linguistic tasks with 90% accuracy and rare min A (goal set 10/09/14)   Time 2   Period Weeks   Status On-going   SLP LONG TERM GOAL #6   Title Pt will demonsrate compensations for attention and recall deficits 1x a session with rare min A (goal set 10/09/14)   Time 2   Period Weeks   Status On-going   SLP LONG TERM GOAL #7   Title Pt will solve simple time, money, reasoning, organization cogntive linguistic tasks with 90% accuracy and occassional min A (goal set 10/09/14)   Time 2   Period Weeks   Status On-going          Plan - 10/23/14 1452    Clinical Impression Statement Recommend to continue skilled ST to maximize attention to detail and liguistic reasonoing/problem solving and alternating attention for independence.    Speech Therapy Frequency 2x / week   Treatment/Interventions Compensatory techniques;Functional tasks;Patient/family education;SLP instruction and feedback;Cognitive reorganization;Environmental controls;Internal/external aids   Potential to Achieve Goals Good   Potential Considerations Severity of impairments;Ability to learn/carryover information   Consulted and Agree with Plan of Care Patient        Problem List Patient Active Problem List   Diagnosis Date Noted  . Left anterior shoulder pain 10/23/2014  . Alterations of sensations following CVA (cerebrovascular accident) 09/10/2014  . Atrial fibrillation 09/06/2014  . Left spastic hemiparesis 08/07/2014  . Left-sided neglect 08/07/2014  . Right middle cerebral artery stroke 08/01/2014  . Tobacco  dependence   . Carotid artery occlusion with infarction   . History of ETOH abuse   . Hyperlipidemia   . Essential hypertension, benign 10/11/2013    Lovvorn, Annye Rusk MS, CCC-SLP 10/23/2014, 2:55 PM  [facility] [address] [place], [place], [address] Phone: [phone]   Fax:  [phone]

## 2014-10-24 ENCOUNTER — Encounter: Payer: Self-pay | Admitting: *Deleted

## 2014-10-24 NOTE — Progress Notes (Signed)
AF episodes from 10/19/2014 printed for review.  Episodes appropriate, +Xarelto, longest 1 hour 6 min, median V ranges 103bpm-133bpm.  [facility] AF alerts changed to AF management.  Monthly summary reports and ROV with Gypsy Balsam, NP on 12/10/2014 at 8:20am.

## 2014-10-25 ENCOUNTER — Ambulatory Visit: Payer: BLUE CROSS/BLUE SHIELD | Admitting: Occupational Therapy

## 2014-10-25 ENCOUNTER — Ambulatory Visit (INDEPENDENT_AMBULATORY_CARE_PROVIDER_SITE_OTHER): Payer: BLUE CROSS/BLUE SHIELD | Admitting: *Deleted

## 2014-10-25 ENCOUNTER — Ambulatory Visit: Payer: BLUE CROSS/BLUE SHIELD | Admitting: Speech Pathology

## 2014-10-25 ENCOUNTER — Encounter: Payer: Self-pay | Admitting: Occupational Therapy

## 2014-10-25 DIAGNOSIS — I69359 Hemiplegia and hemiparesis following cerebral infarction affecting unspecified side: Secondary | ICD-10-CM

## 2014-10-25 DIAGNOSIS — R41841 Cognitive communication deficit: Secondary | ICD-10-CM

## 2014-10-25 DIAGNOSIS — R209 Unspecified disturbances of skin sensation: Secondary | ICD-10-CM

## 2014-10-25 DIAGNOSIS — R4189 Other symptoms and signs involving cognitive functions and awareness: Secondary | ICD-10-CM

## 2014-10-25 DIAGNOSIS — I639 Cerebral infarction, unspecified: Secondary | ICD-10-CM

## 2014-10-25 DIAGNOSIS — I69998 Other sequelae following unspecified cerebrovascular disease: Secondary | ICD-10-CM

## 2014-10-25 NOTE — Therapy (Signed)
[facility] [address] [place], [place], [address] Phone: [phone]   Fax:  [phone]  Occupational Therapy Treatment  Patient Details  Name: [name] MRN: [id number] Date of Birth: Jan 14, 1956 Referring Provider:  Harrison Mons, PA-C  Encounter Date: 10/25/2014      OT End of Session - 10/25/14 1345    Visit Number 21   Number of Visits 25   Date for OT Re-Evaluation 11/08/14   Authorization Type BC[employer]    Authorization - Visit Number 21   Authorization - Number of Visits 30   OT Start Time 1103   OT Stop Time 1145   OT Time Calculation (min) 42 min   Equipment Utilized During Treatment graded clothespins, large pegs/pegboard   Activity Tolerance Patient tolerated treatment well   Behavior During Therapy [facility] for tasks assessed/performed      Past Medical History  Diagnosis Date  . Arthritis   . Hypertension   . Hyperlipidemia   . Cor triatriatum     a. identified on TEE at time of stroke  . Paroxysmal atrial fibrillation     a. identified on LINQ 08/2014  . Stroke 07/25/14    a. s/p MDT ILR implant     Past Surgical History  Procedure Laterality Date  . Loop recorder implant  07/27/14    LOOP REVEAL LINQ [id number] - [id number]  . Tee without cardioversion N/A 07/27/2014    Procedure: TRANSESOPHAGEAL ECHOCARDIOGRAM (TEE);  Surgeon: Pixie Casino, MD;  Location: [facility];  Service: Cardiovascular;  Laterality: N/A;  . Ep implantable device N/A 07/27/2014    Procedure: Loop Recorder Insertion;  Surgeon: Evans Lance, MD;  Location: [facility];  Service: Cardiovascular;  Laterality: N/A;    There were no vitals filed for this visit.  Visit Diagnosis:  Hemiparesis affecting nondominant side as late effect of cerebrovascular accident  Sensation alteration, late effect of cerebrovascular disease  Impaired cognition      Subjective Assessment - 10/25/14 1118    Subjective  "It's much  better"  regarding left shoulder   Pertinent History see snapshot   Patient Stated Goals return to gardening, independence with self care.   Currently in Pain? No/denies   Pain Score 0-No pain            [facility] OT Assessment - 10/25/14 0001    Coordination   Box and Blocks 27 blocks left, 40 right hand                  OT Treatments/Exercises (OP) - 10/25/14 0001    Fine Motor Coordination   Large Pegboard Patient now able to pick up large pegs consistently, without dropping.  Patient continues to be challenged in orienting peg to holes in pegboard.  If he palces peg properly with right hand into left - he can successfully place and have adequate pressure to push into foam board.  Worked on slowing down, improving attention, and orienting pegs with left hand only.   Neurological Re-education Exercises   Other Exercises 1 Neuro reeducation to address use of vision to compensate for lack of sensation in left hand. Patient consistently has improved performance when visually and cognitively attending to jhand and task.  Patient able to sustain pinch on graded clothespins light - medium repetitively.  Patient required cues to avoid trunk lateral flexion, and shoulder elevation during left UE coordiantion and strengthening tasks.  OT Education - 10/25/14 1345    Education provided Yes   Education Details reviewed edema massage and reinforced anterior / posterior contact with right hand on left   Person(s) Educated Patient   Methods Explanation;Demonstration   Comprehension Verbal cues required          OT Short Term Goals - 10/09/14 1636    OT SHORT TERM GOAL #1   Title Patient will independently don socks   Status Achieved   OT SHORT TERM GOAL #2   Title Patient will demonstrate sufficient pincer grasp to tighten shoelace with left hand   Status Achieved   OT SHORT TERM GOAL #3   Title Patient will visually attend to activity presented in  midline for 10 minutes in minimally distracting environment   Status Achieved   OT [place] #4   Title Patient will demonstrate ability to reach left arm overhead (at least 140 degrees shoulder flexion) while upright to hit target and repeat 5 times in preparation for overhead reach / release or grasp   Status Achieved   OT SHORT TERM GOAL #5   Title Patient will prepare a simple cold snack following 2 step directives in minimally distracting environment   Status Partially Met           OT Long Term Goals - 10/23/14 1421    OT LONG TERM GOAL #1   Title Patient will dress himself with modified independence - 11/08/2014   Status On-going   OT LONG TERM GOAL #2   Title Patient will shower himself, including transfer into and out of tub with modified independence   Status Achieved   OT LONG TERM GOAL #3   Title Patient will complete grooming tasks independently, using visual cues for thoroughness (eg - shaving using mirror for guidance)   Status Achieved   OT LONG TERM GOAL #4   Title Patient will effectively use long handled gardeing tool to hoe/rake garden in standing using bilateral upper extremities   Status Achieved   OT LONG TERM GOAL #5   Title Patient will demonstrate safe use of knife for cutting food at a meal, or during meal preparation.  9This goal requires effective use of two hands in this task)   Status On-going               Plan - 10/25/14 1346    Clinical Impression Statement Patient indicates that he is practicing shoe tying at home, and is seeing imporvement.  Patient is progressing toward long term goals due to improved active movement, control, and awareness of left UE, and overall improved sustained attention   Pt will benefit from skilled therapeutic intervention in order to improve on the following deficits (Retired) Decreased balance;Decreased cognition;Decreased coordination;Decreased endurance;Decreased knowledge of precautions;Decreased  knowledge of use of DME;Decreased range of motion;Decreased safety awareness;Impaired perceived functional ability;Increased edema;Decreased strength;Impaired sensation;Impaired tone;Impaired UE functional use;Impaired vision/preception   Rehab Potential Good   OT Frequency 2x / week   OT Duration 4 weeks   OT Treatment/Interventions Self-care/ADL training;Electrical Stimulation;Therapeutic exercise;Neuromuscular education;DME and/or AE instruction;Manual Therapy;Functional Mobility Training;Splinting;Therapeutic exercises;Therapeutic activities;Cognitive remediation/compensation;Visual/perceptual remediation/compensation;Patient/family education;Balance training   Plan left hand function, edema massage - followed with coordination and strengthening,    Consulted and Agree with Plan of Care Patient        Problem List Patient Active Problem List   Diagnosis Date Noted  . Left anterior shoulder pain 10/23/2014  . Alterations of sensations following CVA (cerebrovascular accident) 09/10/2014  .  Atrial fibrillation 09/06/2014  . Left spastic hemiparesis 08/07/2014  . Left-sided neglect 08/07/2014  . Right middle cerebral artery stroke 08/01/2014  . Tobacco dependence   . Carotid artery occlusion with infarction   . History of ETOH abuse   . Hyperlipidemia   . Essential hypertension, benign 10/11/2013    Mariah Milling, OTR/L 10/25/2014, 1:49 PM  [facility] [address] [place], [place], [address] Phone: [phone]   Fax:  [phone]

## 2014-10-25 NOTE — Therapy (Signed)
[facility] [address] [place], [place], [address] Phone: [phone]   Fax:  [phone]  Speech Language Pathology Treatment  Patient Details  Name: [name] MRN: [id number] Date of Birth: [birth year] Referring Provider:  Harrison Mons, PA-C  Encounter Date: 10/25/2014      End of Session - 10/25/14 1239    Visit Number 20   Number of Visits 24   Date for SLP Re-Evaluation 11/06/14   Authorization - Visit Number 28   Authorization - Number of Visits 30   SLP Start Time 1017   SLP Stop Time  1103   SLP Time Calculation (min) 46 min   Activity Tolerance Patient tolerated treatment well      Past Medical History  Diagnosis Date  . Arthritis   . Hypertension   . Hyperlipidemia   . Cor triatriatum     a. identified on TEE at time of stroke  . Paroxysmal atrial fibrillation     a. identified on LINQ 08/2014  . Stroke 07/25/14    a. s/p MDT ILR implant     Past Surgical History  Procedure Laterality Date  . Loop recorder implant  07/27/14    LOOP REVEAL LINQ [id number] - [id number]  . Tee without cardioversion N/A 07/27/2014    Procedure: TRANSESOPHAGEAL ECHOCARDIOGRAM (TEE);  Surgeon: Pixie Casino, MD;  Location: [facility];  Service: Cardiovascular;  Laterality: N/A;  . Ep implantable device N/A 07/27/2014    Procedure: Loop Recorder Insertion;  Surgeon: Evans Lance, MD;  Location: [facility];  Service: Cardiovascular;  Laterality: N/A;    There were no vitals filed for this visit.  Visit Diagnosis: Cognitive communication deficit      Subjective Assessment - 10/25/14 1047    Subjective "I didn't understand the homework so I brought it back"   Currently in Pain? No/denies               ADULT SLP TREATMENT - 10/25/14 1049    General Information   Behavior/Cognition Cooperative;Pleasant mood;Distractible;Alert   Treatment Provided   Treatment provided Cognitive-Linquistic   Cognitive-Linquistic Treatment   Treatment focused on Cognition   Skilled Treatment Organization and alternating attention facilitated with reading schedule and map for [facility], organizing priorities, locations and schedule of shows he would like to see. Pt rare required min A  to identify and prioritize shows. He alternated attention between reading map and schedule and answering questions about both with rare min cues and 90% accuracy. Pt required min to mod A to verbalize reasoning for choices.   Assessment / Recommendations / Plan   Plan Continue with current plan of care   Progression Toward Goals   Progression toward goals Progressing toward goals            SLP Short Term Goals - 09/17/14 1249    SLP SHORT TERM GOAL #1   Title pt will attend to 10 minute cognitive communication task in a mod distracting environment with rare min A back to task   Status Not Met   SLP SHORT TERM GOAL #2   Title pt will complete simple verbal time, money, and math problems 75% success and rare min A   Status Not Met   SLP SHORT TERM GOAL #3   Title pt will demonstrate intellectual awareness to cognitive communicaiton deficits by telling SLP 3 non-physical deficits   Status Not Met          SLP Long  Term Goals - 10/25/14 1239    SLP LONG TERM GOAL #1   Title pt will demo alternating attention in a simple cognitive communication task (looking at a chart and answering question) by having 85% success on the task with rare min A   Status Achieved  revised 09-05-14   SLP LONG TERM GOAL #2   Title pt will demonstrate emergent awareness in simple cognitive linguistic tasks by correcting errors 60% of opportunities   Time 2  2 more visits   Period Weeks   Status Not Met  revised 09-05-14   SLP LONG TERM GOAL #3   Title pt will give appropriate solutions verbally to functional problems 95% of the time   Status Achieved   SLP LONG TERM GOAL #4   Title pt to use some kind of memory  compensation system twice during a therapy session   Time 2   Period Weeks   Status On-going   SLP LONG TERM GOAL #5   Title Pt will alternate attention between 2 simple cogntive linguistic tasks with 90% accuracy and rare min A (goal set 10/09/14)   Time 2   Period Weeks   Status On-going   SLP LONG TERM GOAL #6   Title Pt will demonsrate compensations for attention and recall deficits 1x a session with rare min A (goal set 10/09/14)   Time 2   Period Weeks   Status On-going   SLP LONG TERM GOAL #7   Title Pt will solve simple time, money, reasoning, organization cogntive linguistic tasks with 90% accuracy and occassional min A (goal set 10/09/14)   Time 2   Period Weeks   Status On-going          Plan - 10/25/14 1224    Clinical Impression Statement Recommend to continue skilled ST to maximize attention to detail and liguistic reasonoing/problem solving and alternating attention for independence.    Duration 2 weeks   Treatment/Interventions Compensatory techniques;Functional tasks;Patient/family education;SLP instruction and feedback;Cognitive reorganization;Environmental controls;Internal/external aids   Potential to Achieve Goals Good   Potential Considerations Severity of impairments;Ability to learn/carryover information   Consulted and Agree with Plan of Care Patient        Problem List Patient Active Problem List   Diagnosis Date Noted  . Left anterior shoulder pain 10/23/2014  . Alterations of sensations following CVA (cerebrovascular accident) 09/10/2014  . Atrial fibrillation 09/06/2014  . Left spastic hemiparesis 08/07/2014  . Left-sided neglect 08/07/2014  . Right middle cerebral artery stroke 08/01/2014  . Tobacco dependence   . Carotid artery occlusion with infarction   . History of ETOH abuse   . Hyperlipidemia   . Essential hypertension, benign 10/11/2013    Priyah Schmuck, Annye Rusk MS, CCC-SLP 10/25/2014, 12:41 PM  [facility] [address] [place], [place], [address] Phone: [phone]   Fax:  [phone]

## 2014-10-29 ENCOUNTER — Ambulatory Visit: Payer: BLUE CROSS/BLUE SHIELD | Admitting: Occupational Therapy

## 2014-10-29 ENCOUNTER — Encounter: Payer: Self-pay | Admitting: Occupational Therapy

## 2014-10-29 ENCOUNTER — Ambulatory Visit: Payer: BLUE CROSS/BLUE SHIELD

## 2014-10-29 ENCOUNTER — Ambulatory Visit (INDEPENDENT_AMBULATORY_CARE_PROVIDER_SITE_OTHER): Payer: BLUE CROSS/BLUE SHIELD | Admitting: Neurology

## 2014-10-29 ENCOUNTER — Encounter: Payer: Self-pay | Admitting: Neurology

## 2014-10-29 VITALS — BP 128/80 | HR 78 | Resp 20 | Ht 66.0 in | Wt 182.0 lb

## 2014-10-29 DIAGNOSIS — F17200 Nicotine dependence, unspecified, uncomplicated: Secondary | ICD-10-CM | POA: Diagnosis not present

## 2014-10-29 DIAGNOSIS — G4710 Hypersomnia, unspecified: Secondary | ICD-10-CM | POA: Diagnosis not present

## 2014-10-29 DIAGNOSIS — I69998 Other sequelae following unspecified cerebrovascular disease: Secondary | ICD-10-CM

## 2014-10-29 DIAGNOSIS — G4730 Sleep apnea, unspecified: Secondary | ICD-10-CM

## 2014-10-29 DIAGNOSIS — R41841 Cognitive communication deficit: Secondary | ICD-10-CM

## 2014-10-29 DIAGNOSIS — I63411 Cerebral infarction due to embolism of right middle cerebral artery: Secondary | ICD-10-CM

## 2014-10-29 DIAGNOSIS — I69359 Hemiplegia and hemiparesis following cerebral infarction affecting unspecified side: Secondary | ICD-10-CM | POA: Insufficient documentation

## 2014-10-29 DIAGNOSIS — I69398 Other sequelae of cerebral infarction: Secondary | ICD-10-CM

## 2014-10-29 DIAGNOSIS — I63321 Cerebral infarction due to thrombosis of right anterior cerebral artery: Secondary | ICD-10-CM

## 2014-10-29 DIAGNOSIS — I480 Paroxysmal atrial fibrillation: Secondary | ICD-10-CM | POA: Diagnosis not present

## 2014-10-29 DIAGNOSIS — R41840 Attention and concentration deficit: Secondary | ICD-10-CM

## 2014-10-29 DIAGNOSIS — R209 Unspecified disturbances of skin sensation: Principal | ICD-10-CM

## 2014-10-29 HISTORY — DX: Cerebral infarction due to embolism of right middle cerebral artery: I63.411

## 2014-10-29 NOTE — Progress Notes (Signed)
Loop recorder 

## 2014-10-29 NOTE — Patient Instructions (Signed)
Polysomnography (Sleep Studies) Polysomnography (PSG) is a series of tests used for detecting (diagnosing) obstructive sleep apnea and other sleep disorders. The tests measure how some parts of your body are working while you are sleeping. The tests are extensive and expensive. They are done in a sleep lab or hospital, and vary from center to center. Your caregiver may perform other more simple sleep studies and questionnaires before doing more complete and involved testing. Testing may not be covered by insurance. Some of these tests are:  An EEG (Electroencephalogram). This tests your brain waves and stages of sleep.  An EOG (Electrooculogram). This measures the movements of your eyes. It detects periods of REM (rapid eye movement) sleep, which is your dream sleep.  An EKG (Electrocardiogram). This measures your heart rhythm.  EMG (Electromyography). This is a measurement of how the muscles are working in your upper airway and your legs while sleeping.  An oximetry measurement. It measures how much oxygen (air) you are getting while sleeping.  Breathing efforts may be measured. The same test can be interpreted (understood) differently by different caregivers and centers that study sleep.  Studies may be given an apnea/hypopnea index (AHI). This is a number which is found by counting the times of no breathing or under breathing during the night, and relating those numbers to the amount of time spent in bed. When the AHI is greater than 15, the patient is likely to complain of daytime sleepiness. When the AHI is greater than 30, the patient is at increased risk for heart problems and must be followed more closely. Following the AHI also allows you to know how treatment is working. Simple oximetry (tracking the amount of oxygen that is taken in) can be used for screening patients who:  Do not have symptoms (problems) of OSA.  Have a normal Epworth Sleepiness Scale Score.  Have a low pre-test  probability of having OSA.  Have none of the upper airway problems likely to cause apnea.  Oximetry is also used to determine if treatment is effective in patients who showed significant desaturations (not getting enough oxygen) on their home sleep study. One extra measure of safety is to perform additional studies for the person who only snores. This is because no one can predict with absolute certainty who will have OSA. Those who show significant desaturations (not getting enough oxygen) are recommended to have a more detailed sleep study. Document Released: 08/09/2002 Document Revised: 04/27/2011 Document Reviewed: 04/10/2013 ExitCare Patient Information 2015 [employer]. This information is not intended to replace advice given to you by your health care provider. Make sure you discuss any questions you have with your health care provider.  

## 2014-10-29 NOTE — Progress Notes (Signed)
[facility]  PATIENT: [name] DOB: 1955/04/18  REFERRING CLINICIAN: Hospital stroke team / Hospitalist Team HISTORY FROM: patient and wife  REASON FOR VISIT: new consult    HISTORICAL  CHIEF COMPLAINT:  Chief Complaint  Patient presents with  . sleep consult    pt states that he doesn't know if he snores or not, he "sleeps like a baby", rm 10, alone    HISTORY OF PRESENT ILLNESS:   UPDATE / Dr [name] :  59 year old male with hypertension, hypercholesterolemia, atrial fibrillation, stroke, here for hospital follow-up evaluation. Patient was admitted to the hospital in June 2016 for right brain ischemic infarction, with readmission due to increasing left body symptoms. Patient had numbness weakness on the left face, arm greater than leg. Follow-up MRI showed extension of previous right MCA stroke. Patient has occluded right internal carotid artery. Embolic phenomenon from stump emboli was considered. Patient had loop recorder placed and then patient went through rehabilitation. Patient was discharged on aspirin and Plavix. On 09/03/2014 patient was diagnosed with runs of atrial fibrillation, followed up in cardiology clinic, and was started on Xarelto anticoagulation. Plavix was stopped. Aspirin was continued. Otherwise since that time patient has done fairly well. A/P   Stroke: Extension of Non-dominant right MCA infarct, felt to be embolic from stump emboli from R ICA occlusion vs atrial fibrillation  ResultantLeft hemiparesis (face, arm > leg)  Ongoing aggressive stroke risk factor management   Atrial fibrillation  Continue xarelto  Hypertension  Continue lisinopril  Hyperlipidemia  Continue atorvastatin  Other Stroke Risk Factors  Cigarette smoker --> advised to stop smoking, now down to 1 cigarette per day  ETOH use --> advised to limited to 1 drink per day, now down to a few beers every 2 weeks PLAN: - refer to check SLEEP STUDY (eval  for sleep apnea due to stroke and HTN diagnoses) - continue xarelto for atrial fibrillation - stop aspirin - continue BP meds and statin  PRIOR HPI (Stroke team, Dr. Pearlean [name], 07/30/14): "59 y.o. male with a past medical history significant for HTN, hyperlipidemia, right MCA territory infarct on 6/8 with single right M2 branch occlusion identified just beyond the bifurcation and occluded right ICA at its origin, s/p loop recorder placement, returns to the ED for evaluation of left face weakness, left hemiparesis, and dysarthria. Wife indicated that patient was doing reasonably well after discharge from the hospital 2 days, ut since this morning 07/29/2014, time unknown (LKW) he has been having some trouble with mobility, quieter that usual, and then throughout the course of the day she noticed that he was drooling, his left face was droopy, he was slurring his words, and approximately by 6:42 was not able to use his left arm. Wife said that the left face weakness and left arm weakness are new. Denies HA, vertigo, double vision, or visual impairment. NIHSS 12. CT brain showed no acute abnormality. In comparison to last MRI brain, there is an evolving infarct at the anterior right frontal operculum, the right insular cortex, with vaguesuperior extension into the subcortical white matter of the right parietal lobe. Patient is on aspirin and plavix since recent discharge from the hospital for large vessel disease ( R ICA occlusion). Patient was not administered TPA secondary to recent stroke. He was admitted for further evaluation and treatment."  Sleep consultation from 10-29-14. [name] was recently admitted to the [facility] health system on 07-30-14 and seen by my colleague Dr. Pearlean [name]. He was diagnosed with a  stroke unable to lift his left arm and discharged on aspirin and Plavix. In the meantime he has seen Dr. Marjory [name]  for stroke follow-up visit with , who ordered a sleep study for the patient. The patient  has a past medical history of paroxysmal atrial fibrillation, stroke, hyperlipidemia, hypertension arthritis, he is also a nicotine user, and he states that he "no longer drinks".  The patient is proud that in 15 years he had not lost a days work due to illness. This was until his stroke happened. He does not think that he has any risk factors for sleep apnea and sleeps like a baby he states. He states that he sometimes has restless sleep because of shoulder and arm pain on the left side but it is not known to snore, sleep talk, sleep walk, and that he has not been excessively daytime fatigue or struggle with insomnia at night. He watches TV in the bedroom, otherwise his bedroom is described as cool and quiet. He shares a bedroom with his wife. She stated he doesn't snore. Has not witnessed apneas.  Goes to bed about 9 PM , at the latest 10 PM. He usually falls asleep promptly. He will go up one time at night to go to the bathroom every night at 3 AM. He endorsed however the Epworth sleepiness score at 14 points of the fatigue severity score today at 17 points. He was not given a depression questionnaire. He will nap in daytime he states. Usually after eating lunch, recently even after taking breakfast. He will have the TV running in the background and when he sits down he will fall asleep.   REVIEW OF SYSTEMS: Full 14 system review of systems performed and notable only for depression decreased energy slurred speech and confusion memory loss. EDSS 14, FSS 17.    ALLERGIES: No Known Allergies  HOME MEDICATIONS: Outpatient Prescriptions Prior to Visit  Medication Sig Dispense Refill  . atorvastatin (LIPITOR) 80 MG tablet TAKE 1 TABLET BY MOUTH DAILY AT 6 PM 90 tablet 3  . lisinopril (PRINIVIL,ZESTRIL) 5 MG tablet Take 1 tablet (5 mg total) by mouth daily. 90 tablet 3  . nicotine (NICODERM CQ - DOSED IN MG/24 HOURS) 14 mg/24hr patch 14 mg patch daily 2 weeks then 7 mg patch daily 3 weeks. 28 patch 0   . polyethylene glycol (MIRALAX / GLYCOLAX) packet Take 17 g by mouth daily. 14 each 0  . rivaroxaban (XARELTO) 20 MG TABS tablet Take 1 tablet (20 mg total) by mouth daily with supper. 90 tablet 3   No facility-administered medications prior to visit.    PAST MEDICAL HISTORY: Past Medical History  Diagnosis Date  . Arthritis   . Hypertension   . Hyperlipidemia   . Cor triatriatum     a. identified on TEE at time of stroke  . Paroxysmal atrial fibrillation     a. identified on LINQ 08/2014  . Stroke 07/25/14    a. s/p MDT ILR implant   . Alcohol use with alcohol-induced mood disorder     PAST SURGICAL HISTORY: Past Surgical History  Procedure Laterality Date  . Loop recorder implant  07/27/14    LOOP REVEAL LINQ [id number] - [id number]  . Tee without cardioversion N/A 07/27/2014    Procedure: TRANSESOPHAGEAL ECHOCARDIOGRAM (TEE);  Surgeon: Chrystie Nose, MD;  Location: [facility] ENDOSCOPY;  Service: Cardiovascular;  Laterality: N/A;  . Ep implantable device N/A 07/27/2014    Procedure: Loop Recorder Insertion;  Surgeon: Marinus Maw,  MD;  Location: [facility] INVASIVE CV LAB;  Service: Cardiovascular;  Laterality: N/A;    FAMILY HISTORY: Family History  Problem Relation Age of Onset  . Cancer Father     leukemia  . Heart attack Brother   . Hypertension Brother     SOCIAL HISTORY:  Social History   Social History  . Marital Status: Married    Spouse Name: [name]  . Number of Children: 3  . Years of Education: 9   Occupational History  . meat cutter    Social History Main Topics  . Smoking status: Current Every Day Smoker -- 0.50 packs/day for 40 years    Types: Cigarettes  . Smokeless tobacco: Not on file     Comment: 10/03/14 currently smoking 1 cig a day  . Alcohol Use: 10.8 oz/week    18 Cans of beer per week     Comment: 10/03/14 one beer every week or two  . Drug Use: No  . Sexual Activity: Yes   Other Topics Concern  . Not on file   Social History Narrative   Lives at  home with wife, [name]   Caffeine use - tea 4-5 glasses a day     PHYSICAL EXAM  GENERAL EXAM/CONSTITUTIONAL: Vitals:  Filed Vitals:   10/29/14 1312  BP: 128/80  Pulse: 78  Resp: 20  Height: 5\' 6"  (1.676 m)  Weight: 182 lb (82.555 kg)    Body mass index is 29.39 kg/(m^2).   Wt Readings from Last 3 Encounters:  10/29/14 182 lb (82.555 kg)  10/03/14 182 lb 3.2 oz (82.645 kg)  09/10/14 184 lb 6.4 oz (83.643 kg)    Patient is in no distress; well developed, nourished . He is poorly  groomed; Transport planner, he is presenting with a poor dental status. His neck is supple The patient's left uvula side is lower, his Mallampati is grade 4, he does not have a TMJ click, he has mild retrognathia, dental status is poor, Neck circumference was 15 inches.  Examination of carotid arteries is normal; no carotid bruits. Regular rate and rhythm, no murmurs  awake, alert, oriented to person, place and time, evasive when questions of tobacco use and ETOH use are raised.   recent and remote memory intact  abnormal attention and concentration, speech is mildly dysarthric- left facial droop.  CRANIAL NERVE:   The patient does not experience loss of smell or taste sensation.  2nd - no papilledema on fundoscopic exam  2nd, 3rd, 4th, 6th - pupils equal and reactive to light, visual fields full to confrontation, extraocular muscles intact, no nystagmus  5th - facial sensation decreased in the left lower face   7th - left side decreased  facial strength , lower facial droop.  8th - hearing intact to finger rub, non-lateralized by Rinne and Weber examination.  9th - palate elevates symmetrically, uvula deviated  11th - shoulder shrug, left-sided weakness and pain  12th - tongue protrusion midline  MOTOR:  Right-sided 5 out of 5, left upper extremity 3+ out of 4 over 5   SENSORY: Left-sided decreased to pinprick and fine touch. The patient's left hand also appears slightly swollen, puffier  than the right.  COORDINATION:   finger-nose-finger, fine finger movements slowed on the left side  REFLEXES:   deep tendon reflexes present , there is slightly more brisk on the left side.  GAIT/STATION:   narrow based gait, deferred heel and toe stand ,  tandem gait and Romberg maneuver  DIAGNOSTIC DATA (LABS, IMAGING, TESTING) - I reviewed patient records, labs, notes, testing and imaging myself where available.  Lab Results  Component Value Date   WBC 8.9 09/03/2014   HGB 14.6 09/03/2014   HCT 43.8 09/03/2014   MCV 94.1 09/03/2014   PLT 306.0 09/03/2014      Component Value Date/Time   NA 135 09/03/2014 1223   K 4.1 09/03/2014 1223   CL 101 09/03/2014 1223   CO2 26 09/03/2014 1223   GLUCOSE 107* 09/03/2014 1223   BUN 18 09/03/2014 1223   CREATININE 0.73 09/03/2014 1223   CREATININE 0.75 05/10/2014 0951   CALCIUM 10.0 09/03/2014 1223   PROT 7.6 08/02/2014 0636   ALBUMIN 3.5 08/02/2014 0636   AST 26 08/02/2014 0636   ALT 26 08/02/2014 0636   ALKPHOS 71 08/02/2014 0636   BILITOT 1.0 08/02/2014 0636   GFRNONAA >60 08/02/2014 0636   GFRAA >60 08/02/2014 0636   Lab Results  Component Value Date   CHOL 192 07/26/2014   HDL 34* 07/26/2014   LDLCALC 97 07/26/2014   TRIG 305* 07/26/2014   CHOLHDL 5.6 07/26/2014   Lab Results  Component Value Date   HGBA1C 6.0* 07/26/2014   No results found for: [id number] Lab Results  Component Value Date   TSH 3.313 07/31/2014    07/25/14 MRI brain [I reviewed images myself and agree with interpretation. -VRP]  1. Acute nonhemorrhagic infarct of the right frontal operculum and anterior portion of the insular cortex. 2. Acute punctate infarct in the high right parietal lobe near the vertex. 3. Slow or occluded flow in the right internal carotid artery to the level of the posterior communicating artery. 4. Age advanced periventricular T2 changes likely reflect the sequela of chronic microvascular ischemia.   These  results were called by telephone at the time of interpretation on 07/25/2014 at 6:23 pm to Dr. Thana Farr , who verbally acknowledged these results.  07/29/14 MRI brain [I reviewed images myself and agree with interpretation. -VRP]  - Acute moderate RIGHT middle cerebral artery territory infarct (further involvement of the operculum, new involvement of the insula and RIGHT basal ganglia), propagated from prior imaging. No hemorrhagic conversion. - Suspected occluded RIGHT internal carotid artery, with slow flow versus occluded RIGHT middle cerebral artery.  07/26/14 CTA head/neck [I reviewed images myself and agree with interpretation. -VRP]  1. Right ICA is occluded at its origin with no reconstituted flow in the neck. Moderate to poor reconstitution of the distal right ICA siphon. Right MCA origin and M1 segment are patent. Single right M2 branch occlusion identified just beyond the bifurcation. 2. No other major circle of Willis branch occlusion. Stenosis versus non dominance of the right ACA A1 segment. 3. Left ICA origin and bulb atherosclerosis without hemodynamically significant stenosis. Up to moderate left vertebral artery V1 segment stenosis. 4. Stable CT appearance of the brain since 07/25/2014. 5. Poor dentition.  07/27/14 TEE  - No cardiac source of emboli was indentified.     ASSESSMENT AND PLAN  59 y.o. year old male here for a sleep consultation after having suffered a stroke 3 months ago. The patient stroke affected the right middle cerebral artery distribution causing him to have a left facial droop left facial numbness left shoulder droop and loss of grip strengths and fine motor skills in the left hand. His reflexes on the left are slightly brisker than on the right. Upgoing toe on the left.   Mr. Galvan has a higher risk of  sleep apnea as his uvula is deviated after the stroke and he has a facial droop. He also is a smoker, and apparently a drinker.  He works dayshift  only. With the goal of secondary stroke prevention a sleep study will be ordered. Also the patient seems to be unaware that he is sleepier than the average individual , his Epworth sleepiness score of 14 points does document this. He feels that he sleeps well. I will  order a home sleep test as a screening device for this patient.   I will not change any of his medications but could not follow him for the stroke related illness.  I would like to add that the patient did not endorse any sleep related symptoms on his review of systems today.  HST, BCBS    Melvyn Novas, MD   10/29/2014, 1:50 PM Certified in Neurology, and  Sleep Medicine  [facility] [address] [place], [place] [phone]

## 2014-10-29 NOTE — Therapy (Signed)
[facility] [address] [place], [place], [address] Phone: [phone]   Fax:  [phone]  Occupational Therapy Treatment  Patient Details  Name: [name] MRN: [id number] Date of Birth: October 23, 1955 Referring Provider:  Harrison Mons, PA-C  Encounter Date: 10/29/2014      OT End of Session - 10/29/14 1158    Visit Number 22   Number of Visits 25   Date for OT Re-Evaluation 11/08/14   Authorization Type BCBS    Authorization - Visit Number 59   Authorization - Number of Visits 30   OT Start Time 1100   OT Stop Time 1145   OT Time Calculation (min) 45 min   Equipment Utilized During Treatment red theraputty, griper   Activity Tolerance Patient tolerated treatment well   Behavior During Therapy [facility] for tasks assessed/performed      Past Medical History  Diagnosis Date  . Arthritis   . Hypertension   . Hyperlipidemia   . Cor triatriatum     a. identified on TEE at time of stroke  . Paroxysmal atrial fibrillation     a. identified on LINQ 08/2014  . Stroke 07/25/14    a. s/p MDT ILR implant     Past Surgical History  Procedure Laterality Date  . Loop recorder implant  07/27/14    LOOP REVEAL LINQ [id number] - [id number]  . Tee without cardioversion N/A 07/27/2014    Procedure: TRANSESOPHAGEAL ECHOCARDIOGRAM (TEE);  Surgeon: Pixie Casino, MD;  Location: [facility];  Service: Cardiovascular;  Laterality: N/A;  . Ep implantable device N/A 07/27/2014    Procedure: Loop Recorder Insertion;  Surgeon: Evans Lance, MD;  Location: [facility] CV LAB;  Service: Cardiovascular;  Laterality: N/A;    There were no vitals filed for this visit.  Visit Diagnosis:  Sensation alteration, late effect of cerebrovascular disease  Hemiparesis affecting nondominant side as late effect of cerebrovascular accident  Inattention      Subjective Assessment - 10/29/14 1110    Subjective  It's better - regarding shoulder.   I'd give it about a 4   Pertinent History see snapshot   Patient Stated Goals return to gardening, independence with self care.   Currently in Pain? Yes   Pain Score 4    Pain Location Shoulder   Pain Orientation Left   Pain Descriptors / Indicators Aching   Pain Type Acute pain   Pain Onset More than a month ago   Pain Frequency Intermittent   Multiple Pain Sites No                      OT Treatments/Exercises (OP) - 10/29/14 0001    ADLs   LB Dressing Patient reports he has not yet attempted to put on long pants, but feels this will be easier, as it will not allow him to place two legs into one pant leg.  Discussed that longer pants will be more challenging, due to stifness of fabric, and need for fasteners.  Patient instructed to attempt long pants this week to determine ability.  Patient able to tie shoelace with bilateral hands today, while shoe was on foot.  Patient requires cueing to use vision to aide left hand in this task.  Patient is not completing this task as a part of dressing - wife assists with this whenever patient wears shoes that require tie laces.     Hand Exercises   Theraputty - Flatten Using left  hand exclusively for sustained use.   Theraputty - Grip Using left hand exclusively for sustained use   Theraputty - Locate Pegs Intermttent cueing required to use left hand predominantly for this task.   Hand Gripper with Large Beads Able to pick up and release 10 1 inch cubes with gripper set at lowest setting.     Small Pegboard Patient unable to manipulate pegs at all for grooved pegboard                OT Education - 10/29/14 1158    Education provided Yes   Education Details attention to left hand with fine motor and strengthening exercises.   Person(s) Educated Patient   Methods Explanation;Demonstration   Comprehension Verbal cues required          OT Short Term Goals - 10/09/14 1636    OT SHORT TERM GOAL #1   Title Patient will  independently don socks   Status Achieved   OT SHORT TERM GOAL #2   Title Patient will demonstrate sufficient pincer grasp to tighten shoelace with left hand   Status Achieved   OT SHORT TERM GOAL #3   Title Patient will visually attend to activity presented in midline for 10 minutes in minimally distracting environment   Status Achieved   OT [place] #4   Title Patient will demonstrate ability to reach left arm overhead (at least 140 degrees shoulder flexion) while upright to hit target and repeat 5 times in preparation for overhead reach / release or grasp   Status Achieved   OT SHORT TERM GOAL #5   Title Patient will prepare a simple cold snack following 2 step directives in minimally distracting environment   Status Partially Met           OT Long Term Goals - 10/29/14 1116    OT [place] #1   Title Patient will dress himself with modified independence - 11/08/2014   Status On-going   OT LONG TERM GOAL #2   Title Patient will shower himself, including transfer into and out of tub with modified independence   Status Achieved   OT LONG TERM GOAL #3   Title Patient will complete grooming tasks independently, using visual cues for thoroughness (eg - shaving using mirror for guidance)   Status Achieved   OT LONG TERM GOAL #4   Title Patient will effectively use long handled gardeing tool to hoe/rake garden in standing using bilateral upper extremities   Status Achieved               Plan - 10/29/14 1159    Clinical Impression Statement Patient indicates that he continues to complete more of his own dressing tasks at home, although wife not present to verify.  Patient continues to demonstrate left inattention, sustained attention - difficulty  with selective attention, and dense sensory loss in left hand.     Pt will benefit from skilled therapeutic intervention in order to improve on the following deficits (Retired) Decreased balance;Decreased  cognition;Decreased coordination;Decreased endurance;Decreased knowledge of precautions;Decreased knowledge of use of DME;Decreased range of motion;Decreased safety awareness;Impaired perceived functional ability;Increased edema;Decreased strength;Impaired sensation;Impaired tone;Impaired UE functional use;Impaired vision/preception   Rehab Potential Good   OT Frequency 2x / week   OT Duration 4 weeks   OT Treatment/Interventions Self-care/ADL training;Electrical Stimulation;Therapeutic exercise;Neuromuscular education;DME and/or AE instruction;Manual Therapy;Functional Mobility Training;Splinting;Therapeutic exercises;Therapeutic activities;Cognitive remediation/compensation;Visual/perceptual remediation/compensation;Patient/family education;Balance training   Plan left shoulder range - has recently reported pain, left hand function -  check goal related to dressing - long pants, shoe tying.    Consulted and Agree with Plan of Care Patient        Problem List Patient Active Problem List   Diagnosis Date Noted  . Left anterior shoulder pain 10/23/2014  . Alterations of sensations following CVA (cerebrovascular accident) 09/10/2014  . Atrial fibrillation 09/06/2014  . Left spastic hemiparesis 08/07/2014  . Left-sided neglect 08/07/2014  . Right middle cerebral artery stroke 08/01/2014  . Tobacco dependence   . Carotid artery occlusion with infarction   . History of ETOH abuse   . Hyperlipidemia   . Essential hypertension, benign 10/11/2013    Mariah Milling, OTR/L 10/29/2014, 12:03 PM  [facility] [address] [place], [place], [address] Phone: [phone]   Fax:  [phone]

## 2014-10-29 NOTE — Therapy (Signed)
[facility] [address] [place], [place], [address] Phone: [phone]   Fax:  [phone]  Speech Language Pathology Treatment  Patient Details  Name: [name] MRN: [id number] Date of Birth: 1956-01-05 Referring Provider:  Harrison Mons, PA-C  Encounter Date: 10/29/2014      End of Session - 10/29/14 1155    Visit Number 21   Number of Visits 39   Authorization - Visit Number 21   Authorization - Number of Visits 30   SLP Start Time 4742   SLP Stop Time  1103   SLP Time Calculation (min) 38 min   Activity Tolerance --      Past Medical History  Diagnosis Date  . Arthritis   . Hypertension   . Hyperlipidemia   . Cor triatriatum     a. identified on TEE at time of stroke  . Paroxysmal atrial fibrillation     a. identified on LINQ 08/2014  . Stroke 07/25/14    a. s/p MDT ILR implant     Past Surgical History  Procedure Laterality Date  . Loop recorder implant  07/27/14    LOOP REVEAL LINQ [id number] - [id number]  . Tee without cardioversion N/A 07/27/2014    Procedure: TRANSESOPHAGEAL ECHOCARDIOGRAM (TEE);  Surgeon: Pixie Casino, MD;  Location: [facility];  Service: Cardiovascular;  Laterality: N/A;  . Ep implantable device N/A 07/27/2014    Procedure: Loop Recorder Insertion;  Surgeon: Evans Lance, MD;  Location: [facility];  Service: Cardiovascular;  Laterality: N/A;    There were no vitals filed for this visit.  Visit Diagnosis: Cognitive communication deficit      Subjective Assessment - 10/29/14 1030    Subjective "I thought the [employer] played last night, I was getting disturbed." Pt then had son look at his phone.   Currently in Pain? No/denies               ADULT SLP TREATMENT - 10/29/14 1032    General Information   Behavior/Cognition Cooperative;Pleasant mood;Distractible;Alert   Treatment Provided   Treatment provided Cognitive-Linquistic   Pain Assessment   Pain  Assessment No/denies pain   Cognitive-Linquistic Treatment   Treatment focused on Cognition   Skilled Treatment Map task today targeting alternating attention (alternatign between map and questions for direction finding). Pt required SLP A to initiate task when pt encountered difficulty (decr'd problem solving). Pt was asked to trace his path again after completing once and he req'd looking back at directions for each of first 3 steps. After that he was independent with min SLP nonverbal A.   Assessment / Recommendations / Plan   Plan Continue with current plan of care   Progression Toward Goals   Progression toward goals Progressing toward goals          SLP Education - 10/29/14 1155    Education provided Yes   Education Details attention and higher level problem solving cont to require ST   Person(s) Educated Patient   Methods Explanation;Verbal cues   Comprehension Verbalized understanding          SLP Short Term Goals - 09/17/14 1249    SLP SHORT TERM GOAL #1   Title pt will attend to 10 minute cognitive communication task in a mod distracting environment with rare min A back to task   Status Not Met   SLP SHORT TERM GOAL #2   Title pt will complete simple verbal time, money, and math problems  75% success and rare min A   Status Not Met   SLP SHORT TERM GOAL #3   Title pt will demonstrate intellectual awareness to cognitive communicaiton deficits by telling SLP 3 non-physical deficits   Status Not Met          SLP Long Term Goals - 10/29/14 1157    SLP LONG TERM GOAL #1   Title pt will demo alternating attention in a simple cognitive communication task (looking at a chart and answering question) by having 85% success on the task with rare min A   Status Achieved  revised 09-05-14   SLP LONG TERM GOAL #2   Title pt will demonstrate emergent awareness in simple cognitive linguistic tasks by correcting errors 60% of opportunities   Time 2  2 more visits   Period Weeks    Status Not Met  revised 09-05-14   SLP LONG TERM GOAL #3   Title pt will give appropriate solutions verbally to functional problems 95% of the time   Status Achieved   SLP LONG TERM GOAL #4   Title pt to use some kind of memory compensation system twice during a therapy session   Time 2   Period Weeks   Status On-going   SLP LONG TERM GOAL #5   Title Pt will alternate attention between 2 simple cogntive linguistic tasks with 90% accuracy and rare min A (goal set 10/09/14)   Time 2   Period Weeks   Status On-going   SLP LONG TERM GOAL #6   Title Pt will demonsrate compensations for attention and recall deficits 1x a session with rare min A (goal set 10/09/14)   Time 2   Period Weeks   Status On-going   SLP LONG TERM GOAL #7   Title Pt will solve simple time, money, reasoning, organization cogntive linguistic tasks with 90% accuracy and occassional min A (goal set 10/09/14)   Time 2   Period Weeks   Status On-going          Plan - 10/29/14 1156    Clinical Impression Statement Recommend to continue skilled ST to maximize attention to detail and liguistic reasonoing/problem solving and alternating attention for independence.    Speech Therapy Frequency 2x / week   Duration 1 week   Treatment/Interventions Compensatory techniques;Functional tasks;Patient/family education;SLP instruction and feedback;Cognitive reorganization;Environmental controls;Internal/external aids   Potential to Achieve Goals Good   Potential Considerations Severity of impairments;Ability to learn/carryover information        Problem List Patient Active Problem List   Diagnosis Date Noted  . Left anterior shoulder pain 10/23/2014  . Alterations of sensations following CVA (cerebrovascular accident) 09/10/2014  . Atrial fibrillation 09/06/2014  . Left spastic hemiparesis 08/07/2014  . Left-sided neglect 08/07/2014  . Right middle cerebral artery stroke 08/01/2014  . Tobacco dependence   . Carotid  artery occlusion with infarction   . History of ETOH abuse   . Hyperlipidemia   . Essential hypertension, benign 10/11/2013    [facility] , [place], [place]  10/29/2014, 11:58 AM  [facility] [address] [place], [place], [address] Phone: [phone]   Fax:  [phone]

## 2014-10-30 ENCOUNTER — Ambulatory Visit (INDEPENDENT_AMBULATORY_CARE_PROVIDER_SITE_OTHER): Payer: BLUE CROSS/BLUE SHIELD | Admitting: Family Medicine

## 2014-10-30 ENCOUNTER — Encounter: Payer: Self-pay | Admitting: Internal Medicine

## 2014-10-30 ENCOUNTER — Encounter: Payer: Self-pay | Admitting: Family Medicine

## 2014-10-30 ENCOUNTER — Encounter: Payer: BLUE CROSS/BLUE SHIELD | Admitting: Physician Assistant

## 2014-10-30 VITALS — BP 130/90 | HR 79 | Temp 98.0°F | Resp 16 | Ht 66.0 in | Wt 180.0 lb

## 2014-10-30 DIAGNOSIS — Z0000 Encounter for general adult medical examination without abnormal findings: Secondary | ICD-10-CM | POA: Diagnosis not present

## 2014-10-30 DIAGNOSIS — E7800 Pure hypercholesterolemia, unspecified: Secondary | ICD-10-CM

## 2014-10-30 DIAGNOSIS — E559 Vitamin D deficiency, unspecified: Secondary | ICD-10-CM | POA: Diagnosis not present

## 2014-10-30 DIAGNOSIS — R7309 Other abnormal glucose: Secondary | ICD-10-CM

## 2014-10-30 DIAGNOSIS — I6350 Cerebral infarction due to unspecified occlusion or stenosis of unspecified cerebral artery: Secondary | ICD-10-CM

## 2014-10-30 DIAGNOSIS — IMO0002 Reserved for concepts with insufficient information to code with codable children: Secondary | ICD-10-CM

## 2014-10-30 DIAGNOSIS — F329 Major depressive disorder, single episode, unspecified: Secondary | ICD-10-CM

## 2014-10-30 DIAGNOSIS — R7303 Prediabetes: Secondary | ICD-10-CM

## 2014-10-30 DIAGNOSIS — Z23 Encounter for immunization: Secondary | ICD-10-CM

## 2014-10-30 LAB — LIPID PANEL
CHOL/HDL RATIO: 4.5 ratio (ref <=5.0)
Cholesterol: 193 mg/dL (ref 125–200)
HDL: 43 mg/dL (ref >=40)
LDL Cholesterol: 103 mg/dL (ref <130)
Triglycerides: 234 mg/dL — ABNORMAL HIGH (ref <150)
VLDL: 47 mg/dL — ABNORMAL HIGH (ref <30)

## 2014-10-30 LAB — HEMOGLOBIN A1C
HEMOGLOBIN A1C: 6 % — AB (ref <5.7)
MEAN PLASMA GLUCOSE: 126 mg/dL — AB (ref <117)

## 2014-10-30 NOTE — Patient Instructions (Signed)
You are very close to complete smoking cessation- pick a date and don't buy anymore cigarettes! You can do it! Walk every day Think about your goals for the next several months and talk to your OT/ Dr. Wynn Banker about setting goals with time frames-  Driving Going back to work Naval architect  What can you do to help reach your goals? Home exercises/therapy Find things to do that you enjoy Find ways to relax other than having a cigarette and beer

## 2014-10-30 NOTE — Progress Notes (Signed)
   Subjective:    Patient ID: [name], male    DOB: 07-16-1955, 59 y.o.   MRN: [id number]  HPI    Review of Systems  Constitutional: Positive for fatigue.  HENT: Positive for ear pain.   Eyes: Negative.   Respiratory: Negative.   Gastrointestinal: Negative.   Endocrine: Negative.   Musculoskeletal: Negative.   Skin: Negative.   Allergic/Immunologic: Negative.   Neurological: Negative.   Hematological: Negative.   Psychiatric/Behavioral: Negative.        Objective:   Physical Exam        Assessment & Plan:

## 2014-10-30 NOTE — Progress Notes (Signed)
Subjective:    Patient ID: [name], male    DOB: [birth year], 59 y.o.   MRN: [id number]  HPI This is a 59 yo male who presents today for CPE.   Recent neuro visit summary- UPDATE / Dr Marjory Lies : 59 year old male with hypertension, hypercholesterolemia, atrial fibrillation, stroke, here for hospital follow-up evaluation. Patient was admitted to the hospital in June 2016 for right brain ischemic infarction, with readmission due to increasing left body symptoms. Patient had numbness weakness on the left face, arm greater than leg. Follow-up MRI showed extension of previous right MCA stroke. Patient has occluded right internal carotid artery. Embolic phenomenon from stump emboli was considered. Patient had loop recorder placed and then patient went through rehabilitation. Patient was discharged on aspirin and Plavix. On 09/03/2014 patient was diagnosed with runs of atrial fibrillation, followed up in cardiology clinic, and was started on Xarelto anticoagulation. Plavix was stopped. Aspirin was continued. Otherwise since that time patient has done fairly well. A/P   The patient reports that he is discouraged by his lack of progress following his stoke 3 months ago. He continues with therapy and when encouraged, can list improvements. 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 He feels a loss of independence with his inability to drive. He wants to turn to cigarettes and beer, but realizes that doing this will be harmful to his health.   Last CPE- 8/15 PSA- 8/36/15 Colonoscopy- Dr. Loreta Ave- has gotten recall notice recently for polyp surveillance.  Tdap- unknown  Flu- today Dental- only when he has problems Eye- not in awhile Exercise- currently in PT/OT/ST, walks some  Past Medical History  Diagnosis Date  . Arthritis   . Hypertension   . Hyperlipidemia   . Cor triatriatum     a. identified on TEE at time of stroke  . Paroxysmal atrial  fibrillation     a. identified on LINQ 08/2014  . Stroke 07/25/14    a. s/p MDT ILR implant   . Alcohol use with alcohol-induced mood disorder    Past Surgical History  Procedure Laterality Date  . Loop recorder implant  07/27/14    LOOP REVEAL LINQ [id number] - [id number]  . Tee without cardioversion N/A 07/27/2014    Procedure: TRANSESOPHAGEAL ECHOCARDIOGRAM (TEE);  Surgeon: Chrystie Nose, MD;  Location: [facility];  Service: Cardiovascular;  Laterality: N/A;  . Ep implantable device N/A 07/27/2014    Procedure: Loop Recorder Insertion;  Surgeon: Marinus Maw, MD;  Location: [facility];  Service: Cardiovascular;  Laterality: N/A;   Family History  Problem Relation Age of Onset  . Cancer Father     leukemia  . Heart attack Brother   . Hypertension Brother    Social History  Substance Use Topics  . Smoking status: Current Every Day Smoker -- 0.50 packs/day for 40 years    Types: Cigarettes  . Smokeless tobacco: Not on file     Comment: 10/03/14 currently smoking 1 cig a day  . Alcohol Use: 10.8 oz/week    1-2 Cans of beer per week     Comment: 10/03/14 one beer every week or two  Medications, allergies, past medical history, surgical history, family history, social history and problem list reviewed and updated.   Review of Systems  Constitutional: Positive for fatigue.  HENT: Positive for ear pain.   Eyes: Negative.   Respiratory: Negative.   Cardiovascular: Negative.   Gastrointestinal:  Negative.   Endocrine: Negative.   Genitourinary: Negative.   Musculoskeletal: Positive for arthralgias (left shoulder).  Skin: Negative.   Allergic/Immunologic: Negative.   Neurological: Positive for weakness and numbness.       Has continued weakness, swelling, numbness in left hand.   Psychiatric/Behavioral: Positive for sleep disturbance (hypersomnolence being worked up by neuro) and dysphoric mood. Negative for suicidal ideas and self-injury.      Objective:   Physical  Exam Physical Exam  Constitutional: He is oriented to person, place, and time. He appears well-developed and well-nourished.  HENT:  Head: Normocephalic and atraumatic.  Right Ear: External ear normal.  Left Ear: External ear normal.  Nose: Nose normal.  Mouth/Throat: Oropharynx is clear and moist.  Eyes: Conjunctivae are normal. Pupils are equal, round, and reactive to light.  Neck: Normal range of motion. Neck supple.  Cardiovascular: Normal rate, regular rhythm, normal heart sounds and intact distal pulses.   Pulmonary/Chest: Effort normal and breath sounds normal.  Abdominal: Soft. Bowel sounds are normal. Hernia confirmed negative in the right inguinal area and confirmed negative in the left inguinal area.  Genitourinary: Testes normal and penis normal. Circumcised.  Musculoskeletal: Decreased ROM left shoulder. Left hand with mild edema. Good sensation. Strength 3/5.        Cervical back: Normal.       Thoracic back: Normal.       Lumbar back: Normal.  Lymphadenopathy:    He has no cervical adenopathy.       Right: No inguinal adenopathy present.       Left: No inguinal adenopathy present.  Neurological: He is alert and oriented to person, place, and time. He has normal reflexes.  Skin: Skin is warm and dry.  Psychiatric: He has a normal mood and affect. His behavior is normal. Judgment normal.  Vitals reviewed.  BP 130/90 mmHg  Pulse 79  Temp(Src) 98 F (36.7 C) (Oral)  Resp 16  Ht 5\' 6"  (1.676 m)  Wt 180 lb (81.647 kg)  BMI 29.07 kg/m2 Wt Readings from Last 3 Encounters:  10/30/14 180 lb (81.647 kg)  10/29/14 182 lb (82.555 kg)  10/03/14 182 lb 3.2 oz (82.645 kg)   Depression screen [facility] 2/9 10/30/2014 10/29/2014 09/10/2014 09/06/2014 05/10/2014  Decreased Interest 3 1 3  0 0  Down, Depressed, Hopeless 3 1 1 1  0  PHQ - 2 Score 6 2 4 1  0  Altered sleeping 2 2 1  - -  Tired, decreased energy 2 2 1  - -  Change in appetite 0 1 0 - -  Feeling bad or failure about yourself   1 0 1 - -  Trouble concentrating 1 1 0 - -  Moving slowly or fidgety/restless 0 0 1 - -  Suicidal thoughts 0 0 0 - -  PHQ-9 Score 12 8 8  - -  Difficult doing work/chores - Not difficult at all - - -      Assessment & Plan:  1. Annual physical exam  2. Prediabetes - Lipid panel - Hemoglobin A1c  3. Vitamin D insufficiency - Vit D  25 hydroxy (rtn osteoporosis monitoring)  4. Hypercholesterolemia - Lipid panel  5. Need for prophylactic vaccination and inoculation against influenza - Flu Vaccine QUAD 36+ mos IM  6. Depression due to stroke - discussed starting medication and patient would like to think about it - reviewed his progress since his stroke and created a list of goals that he will discuss with his therapists. Encouraged him to use  his goals as motivation to not smoke and drink.  - encouraged him to get regular exercise on his non-therapy days and to adhere to a regular schedule of sleep and meals  - follow up as scheduled with Porfirio Oar, PA-12/18/14   Olean Ree, FNP-BC  [facility], [facility]  11/03/2014 8:23 AM

## 2014-10-31 ENCOUNTER — Encounter: Payer: Self-pay | Admitting: Occupational Therapy

## 2014-10-31 ENCOUNTER — Ambulatory Visit: Payer: BLUE CROSS/BLUE SHIELD | Admitting: Occupational Therapy

## 2014-10-31 ENCOUNTER — Ambulatory Visit: Payer: BLUE CROSS/BLUE SHIELD

## 2014-10-31 DIAGNOSIS — R41841 Cognitive communication deficit: Secondary | ICD-10-CM | POA: Diagnosis not present

## 2014-10-31 DIAGNOSIS — R209 Unspecified disturbances of skin sensation: Principal | ICD-10-CM

## 2014-10-31 DIAGNOSIS — I69359 Hemiplegia and hemiparesis following cerebral infarction affecting unspecified side: Secondary | ICD-10-CM

## 2014-10-31 DIAGNOSIS — I69998 Other sequelae following unspecified cerebrovascular disease: Secondary | ICD-10-CM

## 2014-10-31 LAB — VITAMIN D 25 HYDROXY (VIT D DEFICIENCY, FRACTURES): VIT D 25 HYDROXY: 36 ng/mL (ref 30–100)

## 2014-10-31 NOTE — Therapy (Signed)
[facility] [address] [place], [place], [address] Phone: [phone]   Fax:  [phone]  Speech Language Pathology Treatment  Patient Details  Name: [name] MRN: [id number] Date of Birth: 08/28/1955 Referring Provider:  Harrison Mons, PA-C  Encounter Date: 10/31/2014      End of Session - 10/31/14 1558    Visit Number 22   Number of Visits 24   Date for SLP Re-Evaluation 11/09/14   Authorization - Visit Number 59   Authorization - Number of Visits 30   SLP Start Time 3557   SLP Stop Time  1446   SLP Time Calculation (min) 39 min   Activity Tolerance Patient tolerated treatment well      Past Medical History  Diagnosis Date  . Arthritis   . Hypertension   . Hyperlipidemia   . Cor triatriatum     a. identified on TEE at time of stroke  . Paroxysmal atrial fibrillation     a. identified on LINQ 08/2014  . Stroke 07/25/14    a. s/p MDT ILR implant   . Alcohol use with alcohol-induced mood disorder     Past Surgical History  Procedure Laterality Date  . Loop recorder implant  07/27/14    LOOP REVEAL LINQ [id number] - [id number]  . Tee without cardioversion N/A 07/27/2014    Procedure: TRANSESOPHAGEAL ECHOCARDIOGRAM (TEE);  Surgeon: Pixie Casino, MD;  Location: [facility];  Service: Cardiovascular;  Laterality: N/A;  . Ep implantable device N/A 07/27/2014    Procedure: Loop Recorder Insertion;  Surgeon: Evans Lance, MD;  Location: [facility];  Service: Cardiovascular;  Laterality: N/A;    There were no vitals filed for this visit.  Visit Diagnosis: Cognitive communication deficit      Subjective Assessment - 10/31/14 1410    Subjective Pt's last week in ST next week. Pt agrees with this.               ADULT SLP TREATMENT - 10/31/14 1417    General Information   Behavior/Cognition Cooperative;Pleasant mood;Distractible;Alert   Treatment Provided   Treatment provided  Cognitive-Linquistic   Pain Assessment   Pain Assessment No/denies pain   Cognitive-Linquistic Treatment   Treatment focused on Cognition   Skilled Treatment Pt req'd min A in organizing/recalling steps used to fabricate his splint 30 minutes earlier. Alternatign attention targeted today by SLP in simple written directions. Pt required mod A from SLP usually in order to complete stimuli correctly., and min A usually for tracking  problems as pt corrected his work. SLP suggested pt cross out problems completed and he did so, however forgot to continue and req'd cues to cont crossing out number of problem.   Assessment / Recommendations / Plan   Plan Continue with current plan of care   Progression Toward Goals   Progression toward goals --  Pt cont to work on attention and awareness            SLP Short Term Goals - 09/17/14 [place] #1   Title pt will attend to 10 minute cognitive communication task in a mod distracting environment with rare min A back to task   Status Not Met   SLP SHORT TERM GOAL #2   Title pt will complete simple verbal time, money, and math problems 75% success and rare min A   Status Not Met   SLP SHORT TERM GOAL #3   Title  pt will demonstrate intellectual awareness to cognitive communicaiton deficits by telling SLP 3 non-physical deficits   Status Not Met          SLP Long Term Goals - 10/31/14 1603    SLP LONG TERM GOAL #1   Title pt will demo alternating attention in a simple cognitive communication task (looking at a chart and answering question) by having 85% success on the task with rare min A   Status Achieved  revised 09-05-14   SLP LONG TERM GOAL #2   Title pt will demonstrate emergent awareness in simple cognitive linguistic tasks by correcting errors 60% of opportunities   Time 2  2 more visits   Period Weeks   Status Not Met  revised 09-05-14   SLP LONG TERM GOAL #3   Title pt will give appropriate solutions verbally to  functional problems 95% of the time   Status Achieved   SLP LONG TERM GOAL #4   Title pt to use some kind of memory compensation system twice during a therapy session   Time 1   Period Weeks   Status On-going   SLP LONG TERM GOAL #5   Title Pt will alternate attention between 2 simple cogntive linguistic tasks with 90% accuracy and rare min A (goal set 10/09/14)   Time 1   Period Weeks   Status On-going   SLP LONG TERM GOAL #6   Title Pt will demonsrate compensations for attention and recall deficits 1x a session with rare min A (goal set 10/09/14)   Time 1   Period Weeks   Status On-going   SLP LONG TERM GOAL #7   Title Pt will solve simple time, money, reasoning, organization cogntive linguistic tasks with 90% accuracy and occassional min A (goal set 10/09/14)   Time 1   Period Weeks   Status On-going          Plan - 10/31/14 1602    Clinical Impression Statement Recommend to continue skilled ST to maximize attention to detail, awareness, liguistic reasonoing/problem solving, and alternating attention for independence.    Speech Therapy Frequency 2x / week   Duration 1 week   Treatment/Interventions Compensatory techniques;Functional tasks;Patient/family education;SLP instruction and feedback;Cognitive reorganization;Environmental controls;Internal/external aids   Potential to Achieve Goals Good   Potential Considerations Severity of impairments;Ability to learn/carryover information        Problem List Patient Active Problem List   Diagnosis Date Noted  . Cerebral infarction due to thrombosis of right anterior cerebral artery 10/29/2014  . Hypersomnia with sleep apnea 10/29/2014  . Embolic stroke involving right middle cerebral artery 10/29/2014  . Nicotine use disorder 10/29/2014  . Left anterior shoulder pain 10/23/2014  . Alterations of sensations following CVA (cerebrovascular accident) 09/10/2014  . Atrial fibrillation 09/06/2014  . Left spastic hemiparesis  08/07/2014  . Left-sided neglect 08/07/2014  . Right middle cerebral artery stroke 08/01/2014  . Tobacco dependence   . Carotid artery occlusion with infarction   . History of ETOH abuse   . Hyperlipidemia   . Essential hypertension, benign 10/11/2013    [facility] , [place], [place]  10/31/2014, 4:10 PM  [facility] [address] [place], [place], [address] Phone: [phone]   Fax:  [phone]

## 2014-10-31 NOTE — Therapy (Signed)
[facility] [address] [place], [place], [address] Phone: [phone]   Fax:  [phone]  Occupational Therapy Treatment  Patient Details  Name: [name] MRN: [id number] Date of Birth: April 05, 1955 Referring Provider:  Harrison Mons, PA-C  Encounter Date: 10/31/2014      OT End of Session - 10/31/14 1620    Visit Number 23   Number of Visits 25   Date for OT Re-Evaluation 11/08/14   Authorization Type BCBS    Authorization - Visit Number 23   Authorization - Number of Visits 30   OT Start Time 1324   OT Stop Time 1400   OT Time Calculation (min) 45 min   Activity Tolerance Patient tolerated treatment well   Behavior During Therapy [facility] for tasks assessed/performed      Past Medical History  Diagnosis Date  . Arthritis   . Hypertension   . Hyperlipidemia   . Cor triatriatum     a. identified on TEE at time of stroke  . Paroxysmal atrial fibrillation     a. identified on LINQ 08/2014  . Stroke 07/25/14    a. s/p MDT ILR implant   . Alcohol use with alcohol-induced mood disorder     Past Surgical History  Procedure Laterality Date  . Loop recorder implant  07/27/14    LOOP REVEAL LINQ [id number] - [id number]  . Tee without cardioversion N/A 07/27/2014    Procedure: TRANSESOPHAGEAL ECHOCARDIOGRAM (TEE);  Surgeon: Pixie Casino, MD;  Location: [facility];  Service: Cardiovascular;  Laterality: N/A;  . Ep implantable device N/A 07/27/2014    Procedure: Loop Recorder Insertion;  Surgeon: Evans Lance, MD;  Location: [facility];  Service: Cardiovascular;  Laterality: N/A;    There were no vitals filed for this visit.  Visit Diagnosis:  Sensation alteration, late effect of cerebrovascular disease  Hemiparesis affecting nondominant side as late effect of cerebrovascular accident      Subjective Assessment - 10/31/14 1441    Subjective  I have been working on it, regarding hand swelling - improving    Pertinent History see snapshot   Patient Stated Goals return to gardening, independence with self care.   Currently in Pain? Yes   Pain Score 4    Pain Location Shoulder   Pain Orientation Left   Pain Descriptors / Indicators Aching   Pain Type Acute pain;Chronic pain   Pain Onset More than a month ago   Pain Frequency Intermittent   Aggravating Factors  end range   Pain Relieving Factors gentle movement   Multiple Pain Sites No                      OT Treatments/Exercises (OP) - 10/31/14 0001    Neurological Re-education Exercises   Other Exercises 1 Patient with more recent reports of shoulder pain, and has recently received cortisone injection.  Reassessed shoulder range today, and patient with significant limitations - active limitations.  Worked in supine initially to prepare joint, and provide gentle mobilization.  Followed with active motion - bilateral with cane in pain free ranges. With movement, patient had significant improvement of both AROM and pain relief.      Other Exercises 2 Patient unable to tolerate quadruped due to wrist pain, attempted to modify position to account for wrist discomfort without success.    Splinting   Splinting Fabricated resting hand splint today to help aleviate edema in digits, promote alignment in wrist  and digits, and prevent further tightness in hand.  Patient able to don and doff, and able to verbalize wearing schedule.                  OT Education - 10/31/14 1619    Education Details Splint schedule and care   Person(s) Educated Patient   Methods Explanation;Demonstration   Comprehension Verbal cues required          OT Short Term Goals - 10/09/14 1636    OT SHORT TERM GOAL #1   Title Patient will independently don socks   Status Achieved   OT SHORT TERM GOAL #2   Title Patient will demonstrate sufficient pincer grasp to tighten shoelace with left hand   Status Achieved   OT SHORT TERM GOAL #3   Title  Patient will visually attend to activity presented in midline for 10 minutes in minimally distracting environment   Status Achieved   OT [place] #4   Title Patient will demonstrate ability to reach left arm overhead (at least 140 degrees shoulder flexion) while upright to hit target and repeat 5 times in preparation for overhead reach / release or grasp   Status Achieved   OT SHORT TERM GOAL #5   Title Patient will prepare a simple cold snack following 2 step directives in minimally distracting environment   Status Partially Met           OT Long Term Goals - 10/29/14 1116    OT [place] #1   Title Patient will dress himself with modified independence - 11/08/2014   Status On-going   OT LONG TERM GOAL #2   Title Patient will shower himself, including transfer into and out of tub with modified independence   Status Achieved   OT LONG TERM GOAL #3   Title Patient will complete grooming tasks independently, using visual cues for thoroughness (eg - shaving using mirror for guidance)   Status Achieved   OT LONG TERM GOAL #4   Title Patient will effectively use long handled gardeing tool to hoe/rake garden in standing using bilateral upper extremities   Status Achieved               Plan - 10/31/14 1620    Clinical Impression Statement Patient more attentive to left hand, and is beginnig to manage edema in left hand and fingers with infrequent cueing.  Patient very eager to drive - however, have still instructed him that vision deficits would limit his safety with driving.     Pt will benefit from skilled therapeutic intervention in order to improve on the following deficits (Retired) Decreased balance;Decreased cognition;Decreased coordination;Decreased endurance;Decreased knowledge of precautions;Decreased knowledge of use of DME;Decreased range of motion;Decreased safety awareness;Impaired perceived functional ability;Increased edema;Decreased strength;Impaired  sensation;Impaired tone;Impaired UE functional use;Impaired vision/preception   Rehab Potential Good   OT Frequency 2x / week   OT Duration 4 weeks   OT Treatment/Interventions Self-care/ADL training;Electrical Stimulation;Therapeutic exercise;Neuromuscular education;DME and/or AE instruction;Manual Therapy;Functional Mobility Training;Splinting;Therapeutic exercises;Therapeutic activities;Cognitive remediation/compensation;Visual/perceptual remediation/compensation;Patient/family education;Balance training   Plan check splint.  print out wear and care, define schedule.  Shoulder HEP.     Consulted and Agree with Plan of Care Patient        Problem List Patient Active Problem List   Diagnosis Date Noted  . Cerebral infarction due to thrombosis of right anterior cerebral artery 10/29/2014  . Hypersomnia with sleep apnea 10/29/2014  . Embolic stroke involving right middle cerebral artery 10/29/2014  .  Nicotine use disorder 10/29/2014  . Left anterior shoulder pain 10/23/2014  . Alterations of sensations following CVA (cerebrovascular accident) 09/10/2014  . Atrial fibrillation 09/06/2014  . Left spastic hemiparesis 08/07/2014  . Left-sided neglect 08/07/2014  . Right middle cerebral artery stroke 08/01/2014  . Tobacco dependence   . Carotid artery occlusion with infarction   . History of ETOH abuse   . Hyperlipidemia   . Essential hypertension, benign 10/11/2013    Mariah Milling, OTR/L 10/31/2014, 4:24 PM  [facility] [address] [place], [place], [address] Phone: [phone]   Fax:  [phone]

## 2014-11-03 ENCOUNTER — Encounter: Payer: Self-pay | Admitting: Family Medicine

## 2014-11-05 ENCOUNTER — Encounter: Payer: Self-pay | Admitting: Occupational Therapy

## 2014-11-05 ENCOUNTER — Ambulatory Visit: Payer: BLUE CROSS/BLUE SHIELD | Admitting: Occupational Therapy

## 2014-11-05 ENCOUNTER — Ambulatory Visit: Payer: BLUE CROSS/BLUE SHIELD

## 2014-11-05 DIAGNOSIS — R41841 Cognitive communication deficit: Secondary | ICD-10-CM | POA: Diagnosis not present

## 2014-11-05 DIAGNOSIS — I69359 Hemiplegia and hemiparesis following cerebral infarction affecting unspecified side: Secondary | ICD-10-CM

## 2014-11-05 DIAGNOSIS — R209 Unspecified disturbances of skin sensation: Principal | ICD-10-CM

## 2014-11-05 DIAGNOSIS — I69998 Other sequelae following unspecified cerebrovascular disease: Secondary | ICD-10-CM

## 2014-11-05 DIAGNOSIS — R41840 Attention and concentration deficit: Secondary | ICD-10-CM

## 2014-11-05 NOTE — Therapy (Signed)
[facility] [address] [place], [place], [address] Phone: [phone]   Fax:  [phone]  Speech Language Pathology Treatment  Patient Details  Name: [name] MRN: [id number] Date of Birth: 03/23/1955 Referring Provider:  Harrison Mons, PA-C  Encounter Date: 11/05/2014      End of Session - 11/05/14 1151    Visit Number 23   Number of Visits 24   Date for SLP Re-Evaluation 11/09/14   Authorization - Visit Number 23   Authorization - Number of Visits 30   SLP Start Time 1104   SLP Stop Time  1145   SLP Time Calculation (min) 41 min   Activity Tolerance Patient tolerated treatment well      Past Medical History  Diagnosis Date  . Arthritis   . Hypertension   . Hyperlipidemia   . Cor triatriatum     a. identified on TEE at time of stroke  . Paroxysmal atrial fibrillation     a. identified on LINQ 08/2014  . Stroke 07/25/14    a. s/p MDT ILR implant   . Alcohol use with alcohol-induced mood disorder     Past Surgical History  Procedure Laterality Date  . Loop recorder implant  07/27/14    LOOP REVEAL LINQ [id number] - [id number]  . Tee without cardioversion N/A 07/27/2014    Procedure: TRANSESOPHAGEAL ECHOCARDIOGRAM (TEE);  Surgeon: Pixie Casino, MD;  Location: [facility];  Service: Cardiovascular;  Laterality: N/A;  . Ep implantable device N/A 07/27/2014    Procedure: Loop Recorder Insertion;  Surgeon: Evans Lance, MD;  Location: [facility];  Service: Cardiovascular;  Laterality: N/A;    There were no vitals filed for this visit.  Visit Diagnosis: Cognitive communication deficit      Subjective Assessment - 11/05/14 1126    Subjective Pt remains agreeable to last week this week. "Well, [name] you know I wasn't the sharpest crayon in the box beforehand."                ADULT SLP TREATMENT - 11/05/14 1110    General Information   Behavior/Cognition Cooperative;Pleasant  mood;Distractible;Alert   Treatment Provided   Treatment provided Cognitive-Linquistic   Pain Assessment   Pain Assessment No/denies pain   Cognitive-Linquistic Treatment   Treatment focused on Cognition   Skilled Treatment Pt forgot reading glasses today. SLP observed skillfully that pt req'd min A occasionally for skipping questions on homework. Pt does not recall receiving a wearing schedule for his splint.  Pt crossed off written directions when SLP cued pt to correct his work. Facilitated further work with attention and awareness today, with pt requiring mod A usually for awareness of errors.   Assessment / Recommendations / Plan   Plan --  d/c next visit, presumably   Progression Toward Goals   Progression toward goals --  pt cont to require work with awareness and attention            SLP Short Term Goals - 09/17/14 [place] #1   Title pt will attend to 10 minute cognitive communication task in a mod distracting environment with rare min A back to task   Status Not Met   SLP SHORT TERM GOAL #2   Title pt will complete simple verbal time, money, and math problems 75% success and rare min A   Status Not Met   SLP SHORT TERM GOAL #3   Title pt will  demonstrate intellectual awareness to cognitive communicaiton deficits by telling SLP 3 non-physical deficits   Status Not Met          SLP Long Term Goals - 11/05/14 1152    SLP LONG TERM GOAL #1   Title pt will demo alternating attention in a simple cognitive communication task (looking at a chart and answering question) by having 85% success on the task with rare min A   Status Achieved  revised 09-05-14   SLP LONG TERM GOAL #2   Title pt will demonstrate emergent awareness in simple cognitive linguistic tasks by correcting errors 60% of opportunities   Time 2  2 more visits   Period Weeks   Status Not Met  revised 09-05-14   SLP LONG TERM GOAL #3   Title pt will give appropriate solutions verbally  to functional problems 95% of the time   Status Achieved   SLP LONG TERM GOAL #4   Title pt to use some kind of memory compensation system twice during a therapy session   Status Not Met   SLP LONG TERM GOAL #5   Title Pt will alternate attention between 2 simple cogntive linguistic tasks with 90% accuracy and rare min A (goal set 10/09/14)   Status Not Met   SLP LONG TERM GOAL #6   Title Pt will demonsrate compensations for attention and recall deficits 1x a session with rare min A (goal set 10/09/14)   Time 1   Period Weeks   Status Achieved   SLP LONG TERM GOAL #7   Title Pt will solve simple time, money, reasoning, organization cogntive linguistic tasks with 90% accuracy and occassional min A (goal set 10/09/14)   Time 1   Period Weeks   Status Not Met          Plan - 11/05/14 1152    Clinical Impression Statement Recommend to continue skilled ST to maximize attention to detail, awareness, liguistic reasonoing/problem solving, and alternating attention for incr'd independence.    Speech Therapy Frequency 2x / week   Duration 1 week   Treatment/Interventions Compensatory techniques;Functional tasks;Patient/family education;SLP instruction and feedback;Cognitive reorganization;Environmental controls;Internal/external aids   Potential to Achieve Goals Good   Potential Considerations Severity of impairments;Ability to learn/carryover information        Problem List Patient Active Problem List   Diagnosis Date Noted  . Cerebral infarction due to thrombosis of right anterior cerebral artery 10/29/2014  . Hypersomnia with sleep apnea 10/29/2014  . Embolic stroke involving right middle cerebral artery 10/29/2014  . Nicotine use disorder 10/29/2014  . Left anterior shoulder pain 10/23/2014  . Alterations of sensations following CVA (cerebrovascular accident) 09/10/2014  . Atrial fibrillation 09/06/2014  . Left spastic hemiparesis 08/07/2014  . Left-sided neglect 08/07/2014  .  Right middle cerebral artery stroke 08/01/2014  . Tobacco dependence   . Carotid artery occlusion with infarction   . History of ETOH abuse   . Hyperlipidemia   . Essential hypertension, benign 10/11/2013    [facility] 11/05/2014, 11:57 AM  [facility] [address] [place], [place], [address] Phone: [phone]   Fax:  [phone]

## 2014-11-05 NOTE — Therapy (Signed)
[facility] [address] [place], [place], [address] Phone: [phone]   Fax:  [phone]  Occupational Therapy Treatment  Patient Details  Name: [name] MRN: [id number] Date of Birth: 1955-04-02 Referring Provider:  Harrison Mons, PA-C  Encounter Date: 11/05/2014      OT End of Session - 11/05/14 1240    Visit Number 24   Number of Visits 25   Date for OT Re-Evaluation 11/08/14   Authorization Type BCBS    Authorization - Visit Number 24   Authorization - Number of Visits 30   OT Start Time 6712   OT Stop Time 1230   OT Time Calculation (min) 45 min   Equipment Utilized During Treatment UBE   Activity Tolerance Patient tolerated treatment well   Behavior During Therapy [facility] for tasks assessed/performed      Past Medical History  Diagnosis Date  . Arthritis   . Hypertension   . Hyperlipidemia   . Cor triatriatum     a. identified on TEE at time of stroke  . Paroxysmal atrial fibrillation     a. identified on LINQ 08/2014  . Stroke 07/25/14    a. s/p MDT ILR implant   . Alcohol use with alcohol-induced mood disorder     Past Surgical History  Procedure Laterality Date  . Loop recorder implant  07/27/14    LOOP REVEAL LINQ [id number] - [id number]  . Tee without cardioversion N/A 07/27/2014    Procedure: TRANSESOPHAGEAL ECHOCARDIOGRAM (TEE);  Surgeon: Pixie Casino, MD;  Location: [facility];  Service: Cardiovascular;  Laterality: N/A;  . Ep implantable device N/A 07/27/2014    Procedure: Loop Recorder Insertion;  Surgeon: Evans Lance, MD;  Location: [facility];  Service: Cardiovascular;  Laterality: N/A;    There were no vitals filed for this visit.  Visit Diagnosis:  Sensation alteration, late effect of cerebrovascular disease  Inattention  Hemiparesis affecting nondominant side as late effect of cerebrovascular accident      Subjective Assessment - 11/05/14 1150    Subjective  I  have been pretty much dressing myself.   Pertinent History see snapshot   Patient Stated Goals return to gardening, independence with self care.   Currently in Pain? Yes   Pain Score 3    Pain Location Shoulder   Pain Orientation Left   Pain Descriptors / Indicators Aching   Pain Type Chronic pain   Pain Onset More than a month ago   Pain Frequency Intermittent   Aggravating Factors  end range   Pain Relieving Factors Gentle movement   Effect of Pain on Daily Activities limits overhead reach   Multiple Pain Sites No                      OT Treatments/Exercises (OP) - 11/05/14 0001    ADLs   LB Dressing Patient indicates that wife is still needing to assist with arranging LB clothing , e/g orienting waistband, etc.  Patient indicates that he has not yet had the opportunity to don long pants.     Neurological Re-education Exercises   Shoulder Flexion AROM;Both;10 reps;Supine  Patient able to flex to 135 degrees of shoulder flexion   Other Exercises 1 Completed 2 min forward, 2 min backward UBE with resistance of 2.0.  Patient able to visually manage left hand to maintain grasp on handle to "pedal"  effectively for 1 min at a time.   Other Exercises 2  Supine exercises to address shoulder range of motion and pain                OT Education - 11/05/14 1224    Education provided Yes   Education Details splint wear and care, shoulder exercises   Person(s) Educated Patient   Methods Explanation;Demonstration   Comprehension Verbalized understanding;Returned demonstration          OT Short Term Goals - 10/09/14 1636    OT SHORT TERM GOAL #1   Title Patient will independently don socks   Status Achieved   OT SHORT TERM GOAL #2   Title Patient will demonstrate sufficient pincer grasp to tighten shoelace with left hand   Status Achieved   OT SHORT TERM GOAL #3   Title Patient will visually attend to activity presented in midline for 10 minutes in minimally  distracting environment   Status Achieved   OT [place] #4   Title Patient will demonstrate ability to reach left arm overhead (at least 140 degrees shoulder flexion) while upright to hit target and repeat 5 times in preparation for overhead reach / release or grasp   Status Achieved   OT SHORT TERM GOAL #5   Title Patient will prepare a simple cold snack following 2 step directives in minimally distracting environment   Status Partially Met           OT Long Term Goals - 10/29/14 1116    OT [place] #1   Title Patient will dress himself with modified independence - 11/08/2014   Status On-going   OT LONG TERM GOAL #2   Title Patient will shower himself, including transfer into and out of tub with modified independence   Status Achieved   OT LONG TERM GOAL #3   Title Patient will complete grooming tasks independently, using visual cues for thoroughness (eg - shaving using mirror for guidance)   Status Achieved   OT LONG TERM GOAL #4   Title Patient will effectively use long handled gardeing tool to hoe/rake garden in standing using bilateral upper extremities   Status Achieved               Plan - 11/05/14 1240    Clinical Impression Statement Patient with improved shoulder range of motion, and decreased report of pain.  Patient understands plan to discharge next visit.  Edema improving in dorsum of hand.   Pt will benefit from skilled therapeutic intervention in order to improve on the following deficits (Retired) Decreased balance;Decreased cognition;Decreased coordination;Decreased endurance;Decreased knowledge of precautions;Decreased knowledge of use of DME;Decreased range of motion;Decreased safety awareness;Impaired perceived functional ability;Increased edema;Decreased strength;Impaired sensation;Impaired tone;Impaired UE functional use;Impaired vision/preception   Rehab Potential Good   OT Frequency 2x / week   OT Duration 4 weeks   OT  Treatment/Interventions Self-care/ADL training;Electrical Stimulation;Therapeutic exercise;Neuromuscular education;DME and/or AE instruction;Manual Therapy;Functional Mobility Training;Splinting;Therapeutic exercises;Therapeutic activities;Cognitive remediation/compensation;Visual/perceptual remediation/compensation;Patient/family education;Balance training   Plan check long term goals - remaining goal for ADL   OT Home Exercise Plan issued 09/06/14, simple functional use of LUE, added home activity to incorporate more particiaption in ADL, reviewed bed positioning for shoulder support, and edema massage left hand, added shoulder exercises 9/19   Consulted and Agree with Plan of Care Patient        Problem List Patient Active Problem List   Diagnosis Date Noted  . Cerebral infarction due to thrombosis of right anterior cerebral artery 10/29/2014  . Hypersomnia with sleep apnea 10/29/2014  .  Embolic stroke involving right middle cerebral artery 10/29/2014  . Nicotine use disorder 10/29/2014  . Left anterior shoulder pain 10/23/2014  . Alterations of sensations following CVA (cerebrovascular accident) 09/10/2014  . Atrial fibrillation 09/06/2014  . Left spastic hemiparesis 08/07/2014  . Left-sided neglect 08/07/2014  . Right middle cerebral artery stroke 08/01/2014  . Tobacco dependence   . Carotid artery occlusion with infarction   . History of ETOH abuse   . Hyperlipidemia   . Essential hypertension, benign 10/11/2013    Mariah Milling, OTR/L 11/05/2014, 12:44 PM  [facility] [address] [place], [place], [address] Phone: [phone]   Fax:  [phone]

## 2014-11-05 NOTE — Patient Instructions (Signed)
   We will be discharging next session.   You have come a long way. And maybe if you see an improvement in awareness of errors and your focusing, you could return at a later date (maybe sometime in the new year).

## 2014-11-05 NOTE — Patient Instructions (Addendum)
Hand Splint   Wearing Schedule: Day Night As needed 2 hours per session.3 sessions per day. Ok to wear the splint all night if that is comfortable.   Check skin condition after 60 minutes for changes in color, irritation or swelling.  Copyright  VHI. All rights reserved.    Cane Exercise: Flexion   Lie on back, holding cane above chest. Keeping arms as straight as possible, move cane backward as far as possible without increasing shoulder pain.  Move slowly, and move right and left arm together.   Repeat 10 times. Do 3 sessions per day.  http://gt2.exer.us/91   Copyright  VHI. All rights reserved.

## 2014-11-07 ENCOUNTER — Ambulatory Visit: Payer: BLUE CROSS/BLUE SHIELD

## 2014-11-07 ENCOUNTER — Encounter: Payer: Self-pay | Admitting: Occupational Therapy

## 2014-11-07 ENCOUNTER — Telehealth: Payer: Self-pay

## 2014-11-07 ENCOUNTER — Ambulatory Visit: Payer: BLUE CROSS/BLUE SHIELD | Admitting: Occupational Therapy

## 2014-11-07 DIAGNOSIS — I69998 Other sequelae following unspecified cerebrovascular disease: Secondary | ICD-10-CM

## 2014-11-07 DIAGNOSIS — R4189 Other symptoms and signs involving cognitive functions and awareness: Secondary | ICD-10-CM

## 2014-11-07 DIAGNOSIS — R41840 Attention and concentration deficit: Secondary | ICD-10-CM

## 2014-11-07 DIAGNOSIS — I69359 Hemiplegia and hemiparesis following cerebral infarction affecting unspecified side: Secondary | ICD-10-CM

## 2014-11-07 DIAGNOSIS — R41841 Cognitive communication deficit: Secondary | ICD-10-CM | POA: Diagnosis not present

## 2014-11-07 DIAGNOSIS — R209 Unspecified disturbances of skin sensation: Principal | ICD-10-CM

## 2014-11-07 NOTE — Telephone Encounter (Signed)
Patient is requesting a refill on his medicine   lisinopril (PRINIVIL,ZESTRIL) 5 MG tablet [[id number]]     Can't be faxed into the pharmacy it has to be called into the pharmacy. [facility] through [employer] [phone]

## 2014-11-07 NOTE — Therapy (Signed)
[facility] [address] [place], [place], [address] Phone: [phone]   Fax:  [phone]  Occupational Therapy Treatment  Patient Details  Name: [name] MRN: [id number] Date of Birth: [birth year] Referring Provider:  Harrison Mons, PA-C  Encounter Date: 11/07/2014      OT End of Session - 11/07/14 1546    Visit Number 25   Number of Visits 25   Date for OT Re-Evaluation 11/08/14   Authorization Type [employer]    Authorization - Visit Number 25   Authorization - Number of Visits 30   OT Start Time 1100   OT Stop Time 1145   OT Time Calculation (min) 45 min   Activity Tolerance Patient tolerated treatment well   Behavior During Therapy [facility] for tasks assessed/performed      Past Medical History  Diagnosis Date  . Arthritis   . Hypertension   . Hyperlipidemia   . Cor triatriatum     a. identified on TEE at time of stroke  . Paroxysmal atrial fibrillation     a. identified on LINQ 08/2014  . Stroke 07/25/14    a. s/p MDT ILR implant   . Alcohol use with alcohol-induced mood disorder     Past Surgical History  Procedure Laterality Date  . Loop recorder implant  07/27/14    LOOP REVEAL LINQ [id number] - [id number]  . Tee without cardioversion N/A 07/27/2014    Procedure: TRANSESOPHAGEAL ECHOCARDIOGRAM (TEE);  Surgeon: Pixie Casino, MD;  Location: [facility];  Service: Cardiovascular;  Laterality: N/A;  . Ep implantable device N/A 07/27/2014    Procedure: Loop Recorder Insertion;  Surgeon: Evans Lance, MD;  Location: [facility];  Service: Cardiovascular;  Laterality: N/A;    There were no vitals filed for this visit.  Visit Diagnosis:  Sensation alteration, late effect of cerebrovascular disease  Hemiparesis affecting nondominant side as late effect of cerebrovascular accident  Inattention  Impaired cognition      Subjective Assessment - 11/07/14 1112    Subjective  I think I could cut a  steak if I had my chain glove on.  I wouldn't dare try to cut without my glove on.     Pertinent History see snapshot   Patient Stated Goals return to gardening, independence with self care.   Currently in Pain? Yes   Pain Score 3    Pain Location Shoulder   Pain Orientation Left   Pain Descriptors / Indicators Aching   Pain Type Chronic pain   Pain Onset More than a month ago   Pain Frequency Intermittent   Aggravating Factors  end range   Pain Relieving Factors icy hot, gentle movement   Effect of Pain on Daily Activities limits overhead reach   Multiple Pain Sites No            [facility] OT Assessment - 11/07/14 0001    Sensation   Light Touch Impaired Detail   Light Touch Impaired Details Impaired LUE   Additional Comments Patient now has sensory awareness from left elbow to wrist - both on volar and dorsal aspects of forearm.  Patient has signifcantly diminshed sensation on dorsum of left hand, and absent sensation in digits and palm.     AROM   Overall AROM Comments Left shoulder flexion 0-142 degrees.                    OT Treatments/Exercises (OP) - 11/07/14 0001  ADLs   UB Dressing Patient able to don/ doff pull over clothing independently, however, unable to manage zippers, buttons bilaterally.    LB Dressing Patient reports he was able to don long pants today without assistance.  Patient indicates that he has had episodes at home where he has put on both shoes - and when he walks out of the bedroom, he realizes that his shoe has fallen off his foot without his awareness - sensory loss still profound.     Cognitive Exercises   Other Cognitive Exercises 1 Patient now able to anticipate potential problem areas, e.g. in caring for grandson patient asking opinion if various activities are safe.  Patient anticipating potential problem areas with return to work - and is exploring (mentally) potential other work opportunites could exist for him.     Other Cognitive  Exercises 2 Patient with improved visual attention to left, improved selective attention as evidenced by his ability to follow 2 step verbal directives of novel task in a moderately busy environment.   Fine Motor Coordination   Small Pegboard Patient unable to complete the 9 hole peg test, however, for the first time able to pick up and orient small pegs into holes with left hand.  Patient able to recognize this as porgress, and indicated feeling very good about this work.  Patient also able to verbalize that he needs to constantly look at  his left hand to help it move as he cannot feel it.                  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 and decreased vision   Person(s) Educated Patient   Methods Explanation   Comprehension Verbalized understanding          OT Short Term Goals - 11/07/14 1552    OT SHORT TERM GOAL #5   Title Patient will prepare a simple cold snack following 2 step directives in minimally distracting environment   Status Achieved           OT Long Term Goals - 11/07/14 1553    OT LONG TERM GOAL #1   Title Patient will dress himself with modified independence - 11/08/2014   Status Achieved   OT LONG TERM GOAL #2   Title Patient will shower himself, including transfer into and out of tub with modified independence   Status Achieved   OT LONG TERM GOAL #3   Title Patient will complete grooming tasks independently, using visual cues for thoroughness (eg - shaving using mirror for guidance)   Status Achieved   OT LONG TERM GOAL #4   Title Patient will effectively use long handled gardeing tool to hoe/rake garden in standing using bilateral upper extremities   Status Achieved   OT LONG TERM GOAL #5   Title Patient will demonstrate safe use of knife for cutting food at a meal, or during meal preparation.  9This goal requires  effective use of two hands in this task)   Status Not Met               Plan - 11/07/14 1546    Clinical Impression Statement Patient has met his long term goals and is prepared for discharge from OT services at this time. Patient has made significant gains in areas of attention, awareness, simple problem solving, active movement and  functional use of left arm/ hand, and independence with basic self care skills.   Pt will benefit from skilled therapeutic intervention in order to improve on the following deficits (Retired) Decreased balance;Decreased cognition;Decreased coordination;Decreased endurance;Decreased knowledge of precautions;Decreased knowledge of use of DME;Decreased range of motion;Decreased safety awareness;Impaired perceived functional ability;Increased edema;Decreased strength;Impaired sensation;Impaired tone;Impaired UE functional use;Impaired vision/preception   Rehab Potential Good   OT Frequency 2x / week   OT Duration 4 weeks   OT Treatment/Interventions Self-care/ADL training;Electrical Stimulation;Therapeutic exercise;Neuromuscular education;DME and/or AE instruction;Manual Therapy;Functional Mobility Training;Splinting;Therapeutic exercises;Therapeutic activities;Cognitive remediation/compensation;Visual/perceptual remediation/compensation;Patient/family education;Balance training   Plan discharge form OT   OT Home Exercise Plan issued 09/06/14, simple functional use of LUE, added home activity to incorporate more particiaption in ADL, reviewed bed positioning for shoulder support, and edema massage left hand, added shoulder exercises 9/19   Consulted and Agree with Plan of Care Patient        Problem List Patient Active Problem List   Diagnosis Date Noted  . Cerebral infarction due to thrombosis of right anterior cerebral artery 10/29/2014  . Hypersomnia with sleep apnea 10/29/2014  . Embolic stroke involving right middle cerebral artery 10/29/2014  . Nicotine  use disorder 10/29/2014  . Left anterior shoulder pain 10/23/2014  . Alterations of sensations following CVA (cerebrovascular accident) 09/10/2014  . Atrial fibrillation 09/06/2014  . Left spastic hemiparesis 08/07/2014  . Left-sided neglect 08/07/2014  . Right middle cerebral artery stroke 08/01/2014  . Tobacco dependence   . Carotid artery occlusion with infarction   . History of ETOH abuse   . Hyperlipidemia   . Essential hypertension, benign 10/11/2013   OCCUPATIONAL THERAPY DISCHARGE SUMMARY  Visits from Start of Care: 25  Current functional level related to goals / functional outcomes: Patient is currently modified independent with bathing and basic dressing skills.  He demonstrates improved selective attention, improved awareness of his deficits, and improved visual attention to left side of body and environment.     Remaining deficits: Absent sensation left hand, decreased memory, decreased problem solving/ reasoning   Education / Equipment: Resting hand splint Edema management techniques, Home exercise and activity program Plan: Patient agrees to discharge.  Patient goals were partially met. Patient is being discharged due to meeting the stated rehab goals.  ?????       Mariah Milling, OTR/L 11/07/2014, 3:55 PM  [facility] [address] [place], [place], [address] Phone: [phone]   Fax:  [phone]

## 2014-11-07 NOTE — Patient Instructions (Signed)
Continue to work with verbal and written tasks - crosswords, etc.

## 2014-11-07 NOTE — Therapy (Signed)
[facility] [address] [place], [place], [address] Phone: [phone]   Fax:  [phone]  Speech Language Pathology Treatment  Patient Details  Name: [name] MRN: [id number] Date of Birth: [birth year] Referring Provider:  Harrison Mons, PA-C  Encounter Date: 11/07/2014      End of Session - 11/07/14 1229    Visit Number 24   Number of Visits 24   Date for SLP Re-Evaluation 11/09/14   Authorization - Visit Number 47   Authorization - Number of Visits 54   SLP Start Time 1150   SLP Stop Time  1228   SLP Time Calculation (min) 38 min   Activity Tolerance Patient tolerated treatment well      Past Medical History  Diagnosis Date  . Arthritis   . Hypertension   . Hyperlipidemia   . Cor triatriatum     a. identified on TEE at time of stroke  . Paroxysmal atrial fibrillation     a. identified on LINQ 08/2014  . Stroke 07/25/14    a. s/p MDT ILR implant   . Alcohol use with alcohol-induced mood disorder     Past Surgical History  Procedure Laterality Date  . Loop recorder implant  07/27/14    LOOP REVEAL LINQ [id number] - [id number]  . Tee without cardioversion N/A 07/27/2014    Procedure: TRANSESOPHAGEAL ECHOCARDIOGRAM (TEE);  Surgeon: Pixie Casino, MD;  Location: [facility];  Service: Cardiovascular;  Laterality: N/A;  . Ep implantable device N/A 07/27/2014    Procedure: Loop Recorder Insertion;  Surgeon: Evans Lance, MD;  Location: [facility];  Service: Cardiovascular;  Laterality: N/A;    There were no vitals filed for this visit.  Visit Diagnosis: Cognitive communication deficit             ADULT SLP TREATMENT - 11/07/14 1154    General Information   Behavior/Cognition Cooperative;Pleasant mood;Distractible;Alert   Treatment Provided   Treatment provided Cognitive-Linquistic   Pain Assessment   Pain Assessment No/denies pain   Cognitive-Linquistic Treatment   Treatment  focused on Cognition   Skilled Treatment Pt forgot reading glasses today, so SLP used picture sequencing cards instead of written material for therapy.  In 8-step picture sequencing task, pt demonstrated decr'd emergent awareness consistently, requiring SLP assistance. In verbal sequencing tasks pt req'd occasional A keeping pt on task with his explanation.    Assessment / Recommendations / Plan   Plan Discharge SLP treatment due to (comment)  max potential reached   Progression Toward Goals   Progression toward goals --  pt cont to demo noted awareness and attention deficits            SLP Short Term Goals - 09/17/14 1249    SLP SHORT TERM GOAL #1   Title pt will attend to 10 minute cognitive communication task in a mod distracting environment with rare min A back to task   Status Not Met   SLP SHORT TERM GOAL #2   Title pt will complete simple verbal time, money, and math problems 75% success and rare min A   Status Not Met   SLP SHORT TERM GOAL #3   Title pt will demonstrate intellectual awareness to cognitive communicaiton deficits by telling SLP 3 non-physical deficits   Status Not Met          SLP Long Term Goals - 11/07/14 1231    SLP LONG TERM GOAL #1   Title pt will demo  alternating attention in a simple cognitive communication task (looking at a chart and answering question) by having 85% success on the task with rare min A   Status Achieved  revised 09-05-14   SLP LONG TERM GOAL #2   Title pt will demonstrate emergent awareness in simple cognitive linguistic tasks by correcting errors 60% of opportunities   Time 2  2 more visits   Period Weeks   Status Not Met  revised 09-05-14   SLP LONG TERM GOAL #3   Title pt will give appropriate solutions verbally to functional problems 95% of the time   Status Achieved   SLP LONG TERM GOAL #4   Title pt to use some kind of memory compensation system twice during a therapy session   Status Not Met   SLP LONG TERM GOAL #5    Title Pt will alternate attention between 2 simple cogntive linguistic tasks with 90% accuracy and rare min A (goal set 10/09/14)   Status Not Met   SLP LONG TERM GOAL #6   Title Pt will demonsrate compensations for attention and recall deficits 1x a session with rare min A (goal set 10/09/14)   Time 1   Period Weeks   Status Achieved   SLP LONG TERM GOAL #7   Title Pt will solve simple time, money, reasoning, organization cogntive linguistic tasks with 90% accuracy and occassional min A (goal set 10/09/14)   Time 1   Period Weeks   Status Not Met          Plan - 11/07/14 1230    Clinical Impression Statement Pt cont with awareness, attention, including attention to detail, and organization/executive function. Pt has maximized rehab potential at this time and will be discharged.   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 See "goals" section above for more details on specific goal status.   Remaining deficits: As above, awareness, attention, and executive function.   Education / Equipment: Compensations for memory Plan: Patient agrees to discharge.  Patient goals were partially met. Patient is being discharged due to lack of progress.  ?????        Problem List Patient Active Problem List   Diagnosis Date Noted  . Cerebral infarction due to thrombosis of right anterior cerebral artery  10/29/2014  . Hypersomnia with sleep apnea 10/29/2014  . Embolic stroke involving right middle cerebral artery 10/29/2014  . Nicotine use disorder 10/29/2014  . Left anterior shoulder pain 10/23/2014  . Alterations of sensations following CVA (cerebrovascular accident) 09/10/2014  . Atrial fibrillation 09/06/2014  . Left spastic hemiparesis 08/07/2014  . Left-sided neglect 08/07/2014  . Right middle cerebral artery stroke 08/01/2014  . Tobacco dependence   . Carotid artery occlusion with infarction   . History of ETOH abuse   . Hyperlipidemia   . Essential hypertension, benign 10/11/2013    [facility] , [place], [place]  11/07/2014, 12:32 PM  [facility] [address] [place], [place], [address] Phone: [phone]   Fax:  [phone]

## 2014-11-08 NOTE — Telephone Encounter (Signed)
Called in his prescription to [facility] and called the Pt and informed him that it was called in.

## 2014-11-09 DIAGNOSIS — Z0271 Encounter for disability determination: Secondary | ICD-10-CM

## 2014-11-12 ENCOUNTER — Encounter: Payer: BLUE CROSS/BLUE SHIELD | Admitting: Occupational Therapy

## 2014-11-14 ENCOUNTER — Encounter: Payer: BLUE CROSS/BLUE SHIELD | Admitting: Occupational Therapy

## 2014-11-20 ENCOUNTER — Encounter: Payer: Self-pay | Admitting: Physical Medicine & Rehabilitation

## 2014-11-20 ENCOUNTER — Ambulatory Visit (HOSPITAL_BASED_OUTPATIENT_CLINIC_OR_DEPARTMENT_OTHER): Payer: BLUE CROSS/BLUE SHIELD | Admitting: Physical Medicine & Rehabilitation

## 2014-11-20 ENCOUNTER — Encounter: Payer: BLUE CROSS/BLUE SHIELD | Attending: Physical Medicine & Rehabilitation

## 2014-11-20 VITALS — BP 154/89 | HR 72

## 2014-11-20 DIAGNOSIS — M7552 Bursitis of left shoulder: Secondary | ICD-10-CM

## 2014-11-20 DIAGNOSIS — G890 Central pain syndrome: Secondary | ICD-10-CM

## 2014-11-20 DIAGNOSIS — G8194 Hemiplegia, unspecified affecting left nondominant side: Secondary | ICD-10-CM

## 2014-11-20 DIAGNOSIS — I10 Essential (primary) hypertension: Secondary | ICD-10-CM | POA: Insufficient documentation

## 2014-11-20 DIAGNOSIS — E785 Hyperlipidemia, unspecified: Secondary | ICD-10-CM | POA: Diagnosis not present

## 2014-11-20 DIAGNOSIS — I69354 Hemiplegia and hemiparesis following cerebral infarction affecting left non-dominant side: Secondary | ICD-10-CM | POA: Insufficient documentation

## 2014-11-20 DIAGNOSIS — F17200 Nicotine dependence, unspecified, uncomplicated: Secondary | ICD-10-CM | POA: Insufficient documentation

## 2014-11-20 DIAGNOSIS — M1990 Unspecified osteoarthritis, unspecified site: Secondary | ICD-10-CM | POA: Insufficient documentation

## 2014-11-20 MED ORDER — GABAPENTIN 300 MG PO CAPS: 300.0000 mg | ORAL_CAPSULE | Freq: Three times a day (TID) | ORAL | Status: DC

## 2014-11-20 NOTE — Progress Notes (Signed)
Subjective:    Patient ID: [name], male    DOB: 10/21/1955, 59 y.o.   MRN: [id number] 59 year old right-handed male, with history of hypertension, right MCA territory infarct on July 25, 2014 with single right M2 branch occlusion identified just beyond the bifurcation and occluded right ICA origin.  Underwent loop recorder placement per Dr. Ladona Ridgel, discharged to home on aspirin as well as Plavix.  On July 27, 2014 was independent without assisted device.  The patient had been working full-time prior to initial admission. Presented on July 29, 2014, with increasing left-sided weakness and slurred speech.  An MRI of the brain showed acute moderate right middle cerebral artery territory infarct further involvement of the operculum, new involvement of the Insula and right basal ganglia.  Recent echocardiogram with ejection fraction of 70%, grade I diastolic dysfunction.  DATE OF ADMISSION:  08/01/2014 DATE OF DISCHARGE:  08/10/2014 HPI  Patient had shoulder pain last visit had improvements after subacromial bursa injection. Now complaining of pain in the bicep area elbow forearm and wrist area. Has some stiffness in the hand as well as swelling. 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 General activity 2 Relation with others 4 Enjoyment of life 2 What TIME of day is your pain at its worst? morning and night Sleep (in general) Poor  Pain is worse with: sitting Pain improves with: medication Relief from Meds: 0  Mobility walk without  assistance how many minutes can you walk? 10 ability to climb steps?  yes do you drive?  no  Function not employed: date last employed 07/2006 I need assistance with the following:  dressing, bathing, household duties and shopping  Neuro/Psych confusion depression anxiety  Prior Studies Any changes since last visit?  no  Physicians involved in your care Any changes since last visit?  no   Family History  Problem Relation Age of Onset  . Cancer Father     leukemia  . Heart attack Brother   . Hypertension Brother    Social History   Social History  . Marital Status: Married    Spouse Name: Marylu Lund  . Number of Children: 3  . Years of Education: 9   Occupational History  . meat cutter    Social History Main Topics  . Smoking status: Current Every Day Smoker -- 0.50 packs/day for 40 years    Types: Cigarettes  . Smokeless tobacco: None     Comment: 10/03/14 currently smoking 1 cig a day  . Alcohol Use: 1.2 oz/week    2 Cans of beer per week     Comment: 10/03/14 one beer every week or two  . Drug Use: No  . Sexual Activity: Yes   Other Topics Concern  . None   Social History Narrative   Lives at home with wife, Marylu Lund   Caffeine use - tea 4-5 glasses a day   Past Surgical History  Procedure Laterality Date  . Loop recorder implant  07/27/14    LOOP REVEAL LINQ [id number] - [id number]  .  Tee without cardioversion N/A 07/27/2014    Procedure: TRANSESOPHAGEAL ECHOCARDIOGRAM (TEE);  Surgeon: Chrystie Nose, MD;  Location: [facility];  Service: Cardiovascular;  Laterality: N/A;  . Ep implantable device N/A 07/27/2014    Procedure: Loop Recorder Insertion;  Surgeon: Marinus Maw, MD;  Location: [facility];  Service: Cardiovascular;  Laterality: N/A;   Past Medical History  Diagnosis Date  . Arthritis   . Hypertension   . Hyperlipidemia   . Cor triatriatum     a. identified on TEE at time of stroke  . Paroxysmal atrial fibrillation (HCC)     a. identified  on LINQ 08/2014  . Stroke (HCC) 07/25/14    a. s/p MDT ILR implant   . Alcohol use with alcohol-induced mood disorder (HCC)    BP 154/89 mmHg  Pulse 72  SpO2 98%  Opioid Risk Score:   Fall Risk Score:  `1  Depression screen PHQ 2/9  Depression screen [facility] 2/9 10/30/2014 10/29/2014 09/10/2014 09/06/2014 05/10/2014 01/05/2014  Decreased Interest 0 0 0  Down, Depressed, Hopeless 0 0  PHQ - 2 Score 0 0  Altered sleeping - - -  Tired, decreased energy - - -  Change in appetite 0 1 0 - - -  Feeling bad or failure about yourself  1 0 1 - - -  Trouble concentrating 1 1 0 - - -  Moving slowly or fidgety/restless 0 0 1 - - -  Suicidal thoughts 0 0 0 - - -  PHQ-9 Score - - -  Difficult doing work/chores - Not difficult at all - - - -    Review of Systems  Neurological:       Anxiety  Psychiatric/Behavioral: Positive for confusion. The patient is nervous/anxious.   All other systems reviewed and are negative.     Objective:   Physical Exam  Left shoulder positive impingement sign at 90 Patient has no tenderness to palpation over his MCPs. He has no pain or synovitis in the wrist. He does have some dorsum hand swelling that is mild No pain with range of motion the elbow. Absent sensation to touch in the left upper extremity intact sensation to light touch in left lower extremity Ambulates without assisted device no evidence of toe drag or knee instability. Motor strength is 3 minus in the left deltoid bicep tricep finger flexors 2 minus at the finger extensors. Left lobe summary 4 minus at the hip flexor and extensor 3 minus at the ankle dorsal flexor plantar flexion      Assessment & Plan:  1. 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 Left shoulder pain subacromial bursitis may be developing some adhesive capsulitis as well this is not as bad as it was last month. No need for reinjection at current time but may consider glenohumeral injection at next visit  3. Dysesthesias with sensory loss left upper extremity neuropathic pain. Initiate gabapentin 300 mg 3 times a day

## 2014-11-20 NOTE — Patient Instructions (Addendum)
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

## 2014-11-21 ENCOUNTER — Encounter: Payer: Self-pay | Admitting: Internal Medicine

## 2014-11-23 LAB — CUP PACEART REMOTE DEVICE CHECK: Date Time Interrogation Session: 20160908191024

## 2014-11-23 NOTE — Progress Notes (Signed)
Carelink summary report received. Battery status OK. Normal device function. No new symptom episodes, tachy episodes, brady, or pause episodes. 4 AF episodes, 0.3% + xarelto. Monthly summary reports and ROV w/ AS 12/10/14 & w/ GT 7/17.

## 2014-11-26 ENCOUNTER — Ambulatory Visit (INDEPENDENT_AMBULATORY_CARE_PROVIDER_SITE_OTHER): Payer: BLUE CROSS/BLUE SHIELD | Admitting: *Deleted

## 2014-11-26 DIAGNOSIS — I639 Cerebral infarction, unspecified: Secondary | ICD-10-CM

## 2014-11-27 NOTE — Progress Notes (Signed)
LOOP RECORDER  

## 2014-12-05 ENCOUNTER — Encounter: Payer: Self-pay | Admitting: Internal Medicine

## 2014-12-06 ENCOUNTER — Telehealth: Payer: Self-pay | Admitting: Neurology

## 2014-12-06 ENCOUNTER — Ambulatory Visit: Payer: BLUE CROSS/BLUE SHIELD | Admitting: Physician Assistant

## 2014-12-06 DIAGNOSIS — G4710 Hypersomnia, unspecified: Secondary | ICD-10-CM

## 2014-12-06 DIAGNOSIS — G4730 Sleep apnea, unspecified: Principal | ICD-10-CM

## 2014-12-06 NOTE — Telephone Encounter (Signed)
Split night in.

## 2014-12-06 NOTE — Telephone Encounter (Signed)
HST was denied.  Split sleep doesn't need an authorization.  Do you want to order a split?

## 2014-12-10 ENCOUNTER — Ambulatory Visit (INDEPENDENT_AMBULATORY_CARE_PROVIDER_SITE_OTHER): Payer: BLUE CROSS/BLUE SHIELD | Admitting: Nurse Practitioner

## 2014-12-10 ENCOUNTER — Encounter: Payer: Self-pay | Admitting: Nurse Practitioner

## 2014-12-10 VITALS — BP 140/80 | HR 100 | Ht 66.0 in | Wt 177.8 lb

## 2014-12-10 DIAGNOSIS — F329 Major depressive disorder, single episode, unspecified: Secondary | ICD-10-CM

## 2014-12-10 DIAGNOSIS — I10 Essential (primary) hypertension: Secondary | ICD-10-CM

## 2014-12-10 DIAGNOSIS — I480 Paroxysmal atrial fibrillation: Secondary | ICD-10-CM

## 2014-12-10 DIAGNOSIS — F32A Depression, unspecified: Secondary | ICD-10-CM

## 2014-12-10 NOTE — Patient Instructions (Signed)
Medication Instructions: Your physician recommends that you continue on your current medications as directed. Please refer to the Current Medication list given to you today.   Labwork: NONE ORDER TODAY    Testing/Procedures:  Device Instructions  You are scheduled for:               Implantable Loop Recorder REMOVAL                 on  12/20/14  with Dr. Ladona RidgelAYLOR.  1.   [facility] at  6:30 AM  on                 the day of your  Procedure. @ 7:30 AM    FOLLOW UP:    14 DAYS  AFTER 12/20/14 FOR WOUND CHECK WITH DEVICE CLINIC :  WEEK 01/03/15   Your physician wants you to follow-up in:  IN  6  MONTHS WITH AMBER SEILER   You will receive a reminder letter in the mail two months in advance. If you don't receive a letter, please call our office to schedule the follow-up appointment.          Any Other Special Instructions Will Be Listed Below (If Applicable).

## 2014-12-10 NOTE — Progress Notes (Signed)
Electrophysiology Office Note Date: 12/10/2014  ID:  [name] SpeckingJerry Gearing, DOB [birth year], MRN [id number]  PCP: JEFFERY,CHELLE, PA-C Electrophysiologist: Ladona Ridgelaylor  CC: atrial fibrillation follow up  [name] is a 59 y.o. male seen today for Dr Ladona Ridgelaylor.  He was admitted with cryptogenic stroke in 07/2014 and had ILR placed that admission.  Atrial fibrillation was detected in July of 2016 and he was initiated on Xarelto.  Since last being seen in our clinic, the patient reports doing reasonably well. He is struggling with residual stroke symptoms and is drinking and smoking more due to depression. He denies chest pain, palpitations, dyspnea, PND, orthopnea, nausea, vomiting, dizziness, syncope.  He has persistent left arm limited range of motion and numbness.  He [facility]  Past Medical History  Diagnosis Date  . Arthritis   . Hypertension   . Hyperlipidemia   . Cor triatriatum     a. identified on TEE at time of stroke  . Paroxysmal atrial fibrillation (HCC)     a. identified on LINQ 08/2014  . Stroke (HCC) 07/25/14    a. s/p MDT ILR implant   . Alcohol use with alcohol-induced mood disorder [facility](HCC)    Past Surgical History  Procedure Laterality Date  . Loop recorder implant  07/27/14    LOOP REVEAL LINQ [id number] - [id number]  . Tee without cardioversion N/A 07/27/2014    Procedure: TRANSESOPHAGEAL ECHOCARDIOGRAM (TEE);  Surgeon: Chrystie NoseKenneth C Hilty, MD;  [facility];  Service: Cardiovascular;  Laterality: N/A;  . Ep implantable device N/A 07/27/2014    Procedure: Loop Recorder Insertion;  Surgeon: Marinus MawGregg W Taylor, MD;  [facility];  Service: Cardiovascular;  Laterality: N/A;    Current Outpatient Prescriptions  Medication Sig Dispense Refill  . atorvastatin (LIPITOR) 80 MG tablet TAKE 1 TABLET BY MOUTH DAILY AT 6 PM 90 tablet 3  . gabapentin (NEURONTIN) 300 MG capsule Take 1 capsule (300 mg total) by mouth 3 (three) times daily. 90  capsule 1  . lisinopril (PRINIVIL,ZESTRIL) 5 MG tablet Take 1 tablet (5 mg total) by mouth daily. 90 tablet 3  . polyethylene glycol (MIRALAX / GLYCOLAX) packet Take 17 g by mouth daily. 14 each 0  . rivaroxaban (XARELTO) 20 MG TABS tablet Take 1 tablet (20 mg total) by mouth daily with supper. 90 tablet 3   No current facility-administered medications for this visit.    Allergies:   Review of patient's allergies indicates no known allergies.   Social History: Social History   Social History  . Marital Status: Married    Spouse Name: Marylu LundJanet  . Number of Children: 3  . Years of Education: 9   Occupational History  . meat cutter    Social History Main Topics  . Smoking status: Current Every Day Smoker -- 0.50 packs/day for 40 years    Types: Cigarettes  . Smokeless tobacco: Never Used     Comment: 10/03/14 currently smoking 1 cig a day  . Alcohol Use: 1.2 oz/week    2 Cans of beer per week     Comment: 10/03/14 one beer every week or two  . Drug Use: No  . Sexual Activity: Yes   Other Topics Concern  . Not on file   Social History Narrative   Lives at home with wife, Marylu LundJanet   Caffeine use - tea 4-5 glasses a day    Family History: Family History  Problem Relation Age  of Onset  . Cancer Father     leukemia  . Heart attack Brother   . Hypertension Brother     Review of Systems: All other systems reviewed and are otherwise negative except as noted above.   Physical Exam: VS:  BP 140/80 mmHg  Pulse 100  Ht  (1.676 m)  Wt 177 lb 12.8 oz (80.65 kg)  BMI 28.71 kg/m2  SpO2 97% , BMI Body mass index is 28.71 kg/(m^2). Wt Readings from Last 3 Encounters:  12/10/14 177 lb 12.8 oz (80.65 kg)  10/30/14 180 lb (81.647 kg)  10/29/14 182 lb (82.555 kg)    GEN- The patient is well appearing, alert and oriented x 3 today   HEENT: normocephalic, atraumatic; sclera clear, conjunctiva pink; hearing intact; oropharynx clear; neck supple  Lungs- Clear to ausculation  bilaterally, normal work of breathing.  No wheezes, rales, rhonchi Heart- Regular rate and rhythm, no murmurs, rubs or gallops  GI- soft, non-tender, non-distended, bowel sounds present Extremities- no clubbing, cyanosis, or edema; DP/PT/radial pulses 2+ bilaterally MS- no significant deformity or atrophy, decreased ROM LUE Skin- warm and dry, no rash or lesion  Psych- euthymic mood, full affect   EKG:  EKG is not ordered today.    Recent Labs: 07/31/2014: TSH 3.313 08/02/2014: ALT 26 09/03/2014: BUN 18; Creatinine, Ser 0.73; Hemoglobin 14.6; Platelets 306.0; Potassium 4.1; Sodium 135    Other studies Reviewed: Additional studies/ records that were reviewed today include: Dr Lubertha Basque notes, neurology records  Assessment and Plan: 1.  Paroxysmal atrial fibrillation AF burden by device interrogation today 0.4% V rates controlled Continue Xarelto  1 tablet daily for CHADS2VASC of at least 3 BMET, CBC checked recently by PCP per patient Per neurology, ok to discontinue ASA  He thinks that his shoulder pain is worsened by LINQ.  I have reassured him that I do not think this is the case, but he would like LINQ explanted. As we have detected atrial fibrillation and he is adequately anticoagulated with controlled V rates, I think this is reasonable. Will schedule with Dr Ladona Ridgel at the next available time  2.  HTN Stable No change required today  3.  Depression The patient states that he is depressed which has led to more alcohol and tobacco abuse.   He denies HI/SI today I have encouraged follow up with his PCP to discuss antidepressant therapy He is aware that ETOH will worsen AF burden and we discussed the fact that complete smoking cessation is advised He has a spouse who is very caring and supportive, but she is also struggling with the patient's stroke and residual symptoms   Current medicines are reviewed at length with the patient today.   The patient does not have concerns  regarding his medicines.  The following changes were made today:  none  Labs/ tests ordered today include: none   Disposition:   Follow up with me in 6 months   Signed, Gypsy Balsam, NP 12/10/2014 10:38 AM   [facility] [address] [place] [address] [phone] (office) [phone] (fax)

## 2014-12-17 LAB — CUP PACEART REMOTE DEVICE CHECK: MDC IDC SESS DTM: 20161008040500

## 2014-12-18 ENCOUNTER — Ambulatory Visit (INDEPENDENT_AMBULATORY_CARE_PROVIDER_SITE_OTHER): Payer: BLUE CROSS/BLUE SHIELD | Admitting: Physician Assistant

## 2014-12-18 ENCOUNTER — Encounter: Payer: BLUE CROSS/BLUE SHIELD | Attending: Physical Medicine & Rehabilitation

## 2014-12-18 ENCOUNTER — Ambulatory Visit (HOSPITAL_BASED_OUTPATIENT_CLINIC_OR_DEPARTMENT_OTHER): Payer: BLUE CROSS/BLUE SHIELD | Admitting: Physical Medicine & Rehabilitation

## 2014-12-18 ENCOUNTER — Encounter: Payer: Self-pay | Admitting: Physical Medicine & Rehabilitation

## 2014-12-18 ENCOUNTER — Encounter: Payer: Self-pay | Admitting: Physician Assistant

## 2014-12-18 VITALS — BP 106/59 | HR 62 | Resp 14

## 2014-12-18 VITALS — BP 124/85 | HR 74 | Temp 97.7°F | Resp 16 | Wt 178.2 lb

## 2014-12-18 DIAGNOSIS — F1010 Alcohol abuse, uncomplicated: Secondary | ICD-10-CM | POA: Diagnosis not present

## 2014-12-18 DIAGNOSIS — I63321 Cerebral infarction due to thrombosis of right anterior cerebral artery: Secondary | ICD-10-CM | POA: Diagnosis not present

## 2014-12-18 DIAGNOSIS — IMO0002 Reserved for concepts with insufficient information to code with codable children: Secondary | ICD-10-CM

## 2014-12-18 DIAGNOSIS — F0631 Mood disorder due to known physiological condition with depressive features: Secondary | ICD-10-CM | POA: Diagnosis not present

## 2014-12-18 DIAGNOSIS — M7522 Bicipital tendinitis, left shoulder: Secondary | ICD-10-CM

## 2014-12-18 DIAGNOSIS — E785 Hyperlipidemia, unspecified: Secondary | ICD-10-CM | POA: Diagnosis not present

## 2014-12-18 DIAGNOSIS — I10 Essential (primary) hypertension: Secondary | ICD-10-CM

## 2014-12-18 DIAGNOSIS — F17200 Nicotine dependence, unspecified, uncomplicated: Secondary | ICD-10-CM | POA: Diagnosis not present

## 2014-12-18 DIAGNOSIS — I639 Cerebral infarction, unspecified: Secondary | ICD-10-CM | POA: Diagnosis not present

## 2014-12-18 DIAGNOSIS — M1990 Unspecified osteoarthritis, unspecified site: Secondary | ICD-10-CM | POA: Diagnosis not present

## 2014-12-18 DIAGNOSIS — I69354 Hemiplegia and hemiparesis following cerebral infarction affecting left non-dominant side: Secondary | ICD-10-CM | POA: Insufficient documentation

## 2014-12-18 DIAGNOSIS — I480 Paroxysmal atrial fibrillation: Secondary | ICD-10-CM

## 2014-12-18 DIAGNOSIS — F1011 Alcohol abuse, in remission: Secondary | ICD-10-CM

## 2014-12-18 MED ORDER — BUPROPION HCL ER (XL) 150 MG PO TB24: 150.0000 mg | ORAL_TABLET | Freq: Every day | ORAL | Status: DC

## 2014-12-18 NOTE — Progress Notes (Signed)
I agree with the assessment and plan as directed by PA.    Aima Mcwhirt, MD

## 2014-12-18 NOTE — Progress Notes (Signed)
Patient ID: [name], male    DOB: [birth year], 59 y.o.   MRN: [id number]  PCP: Gentri Guardado, PA-C  Subjective:   Chief Complaint  Patient presents with  . PreDiabetic  . Hyperlipidemia  . depression screen 12    HPI Patient presents today for 51-month follow-up of depression, tobacco dependence, and history of CVA.   Patient had a right MCA stroke in June, was then readmitted d/t increasing left body symptoms. Follow-up MRI showed extension of prior stroke, has occluded right internal carotid artery. Loop recorder was placed, and patient started PT/OT. Discharged on aspirin and Plavix.   Diagnosed with runs of atrial fibrillation in July, started on Xarelto.   Last seen on 10/30/14 by [name] Sprang, FNP for his annual visit, at which time patient was starting to feel depressed. This has resulted in increased tobacco use and increased alcohol consumption. After the stroke, he had initially quit drinking altogether, and had dramatically cut back on smoking.  Patient checks BP at home occasionally, hasn't done so in about a month.   Seeing cardiology tomorrow to take out loop recorder.   Finished with PT/OT. Left arm and anterior shoulder numbness and tingling has been stable, has not noticed any improvement on gabapentin. Doesn't use his left arm at all unless he needs to. Reports increased swelling of the left hand. Left face doing better, but still feels like sandpaper, hard to sleep on that side.   Not walking or doing any other exercise. Diet poor, reports he's lost interest in eating. Eats out at [employer] several times per week. Sleep is poor, wakes up early in the morning thinking about things he wishes he could do like hitting a golf ball, but ends up not doing them.   Would like to talk about anti-depressant therapy. Feels stressed about his recent stroke, his marriage, and his overall loss of independence and not working. Started smoking again in September.  Has gradually picked up to smoking approx. 0.5 pack/day. Has nicotine patches at home, but hasn't been using them. Drinks 1 can of beer daily. Drank more this past weekend at the [place] race.   Would like to know if he can start Viagra. Patient still interested in sex, but his wife is concerned about his heart. He did not have erectile dysfunction prior to his stroke, but notes that "we didn't do much then."   Requests a note to return to work.  Depression screen [facility] 2/9 12/18/2014 10/30/2014 10/29/2014 09/10/2014 09/06/2014  Decreased Interest 0  Down, Depressed, Hopeless PHQ - 2 Score Altered sleeping -  Tired, decreased energy -  Change in appetite - 0 1 0 -  Feeling bad or failure about yourself  3 1 0 1 -  Trouble concentrating 0 1 1 0 -  Moving slowly or fidgety/restless 0 0 0 1 -  Suicidal thoughts 0 0 0 0 -  PHQ-9 Score -  Difficult doing work/chores - - Not difficult at all - -      Review of Systems Constitutional: Positive for activity change (decreased d/t left arm numbness/tingling; has not been walking or exercise) and appetite change (decreased interest in eating). Negative for fever and chills.  Respiratory: Negative for cough.  Cardiovascular: Negative for chest pain and palpitations.  Neurological: Positive for numbness (left anterior shoulder and arm).  Negative for dizziness, facial asymmetry, speech difficulty and headaches.  Psychiatric/Behavioral: Positive for sleep disturbance and dysphoric mood (down about his current situation, loss of independence; bored at home).      Patient Active Problem List   Diagnosis Date Noted  . Cerebral infarction due to thrombosis of right anterior cerebral artery (HCC) 10/29/2014  . Hypersomnia with sleep apnea 10/29/2014  . Embolic stroke involving right middle cerebral artery (HCC) 10/29/2014  . Nicotine use disorder 10/29/2014  . Left anterior shoulder pain  10/23/2014  . Alterations of sensations following CVA (cerebrovascular accident) 09/10/2014  . Atrial fibrillation (HCC) 09/06/2014  . Left spastic hemiparesis (HCC) 08/07/2014  . Left-sided neglect 08/07/2014  . Right middle cerebral artery stroke (HCC) 08/01/2014  . Tobacco dependence   . Carotid artery occlusion with infarction (HCC)   . History of ETOH abuse   . Hyperlipidemia   . Essential hypertension, benign 10/11/2013     Prior to Admission medications   Medication Sig Start Date End Date Taking? Authorizing Provider  atorvastatin (LIPITOR) 80 MG tablet TAKE 1 TABLET BY MOUTH DAILY AT 6 PM 09/25/14  Yes Ethelda ChickKristi M Wiltrout, MD  gabapentin (NEURONTIN) 300 MG capsule Take 1 capsule (300 mg total) by mouth 3 (three) times daily. 11/20/14  Yes Erick ColaceAndrew E Kirsteins, MD  lisinopril (PRINIVIL,ZESTRIL) 5 MG tablet Take 1 tablet (5 mg total) by mouth daily. 10/17/14  Yes Taressa Rauh, PA-C  polyethylene glycol (MIRALAX / GLYCOLAX) packet Take 17 g by mouth daily. 08/01/14  Yes Ashly Hulen SkainsM Gottschalk, DO  rivaroxaban (XARELTO) 20 MG TABS tablet Take 1 tablet (20 mg total) by mouth daily with supper. 09/26/14  Yes Marinus MawGregg W Taylor, MD          Objective:  Physical Exam  Constitutional: He is oriented to person, place, and time. Vital signs are normal. He appears well-developed and well-nourished. He is active and cooperative. No distress.  BP 124/85 mmHg  Pulse 74  Temp(Src) 97.7 F (36.5 C) (Oral)  Resp 16  Wt 178 lb 3.2 oz (80.831 kg)  HENT:  Head: Normocephalic and atraumatic.  Right Ear: Hearing normal.  Left Ear: Hearing normal.  Eyes: Conjunctivae are normal. No scleral icterus.  Neck: Normal range of motion. Neck supple. No thyromegaly present.  Cardiovascular: Normal rate, regular rhythm and normal heart sounds.   Pulses:      Radial pulses are 2+ on the right side, and 2+ on the left side.  Pulmonary/Chest: Effort normal and breath sounds normal.  Lymphadenopathy:       Head  (right side): No tonsillar, no preauricular, no posterior auricular and no occipital adenopathy present.       Head (left side): No tonsillar, no preauricular, no posterior auricular and no occipital adenopathy present.    He has no cervical adenopathy.       Right: No supraclavicular adenopathy present.       Left: No supraclavicular adenopathy present.  Neurological: He is alert and oriented to person, place, and time. No sensory deficit.  General neglect of the LEFT side.  Skin: Skin is warm, dry and intact. No rash noted. No cyanosis or erythema. Nails show no clubbing.  Psychiatric: His speech is normal and behavior is normal. Judgment normal. His mood appears not anxious. His affect is not angry, not blunt, not labile and not inappropriate. Cognition and memory are impaired. He exhibits a depressed mood. He expresses no homicidal and no suicidal ideation.  Talks about wanting to return to work.  He misses it, and misses his regular customers.           Assessment & Plan:   1. Depression due to stroke [facility]) Trial of Wellbutrin. His brother used this with good results. I believe that returning to work, even if with some restrictions, will improve his mood. - buPROPion (WELLBUTRIN XL) 150 MG 24 hr tablet; Take 1 tablet (150 mg total) by mouth daily.  Dispense: 90 tablet; Refill: 3  2. Tobacco dependence Encouraged smoking cessation. He has successfully stopped smoking temporarily previously, which increases the likelihood that he will be able to again. Addressing his depressed mood will be key to achieving this. We may see some added benefit with the addition of Wellbutrin for depression.  3. History of ETOH abuse Encouraged him to minimize alcohol consumption.  4. Cerebrovascular accident (CVA) due to thrombosis of right anterior cerebral artery [facility]) Continue follow-up with neurology and rehabilitation. Consider increasing gabapentin dose, since he doesn't feel like it's making any  difference at the 300 mg TID dose. His return to work note will need to come from his specialists, but I am definitly in support of that from a primary care standpoint.  5. Paroxysmal atrial fibrillation (HCC) Controlled. Proceed with follow-up with cardiology tomorrow as planned.  6. Essential hypertension, benign Controlled.   [name] Bras, PA-C Physician Assistant-Certified [facility]

## 2014-12-18 NOTE — Progress Notes (Signed)
Bicipital tendon sheath injection  Indication biceps Tenosynovitis which does not respond to conservative care including medications and therapy and interferes with activity causing pain  Informed consent was obtained after describing risks and benefits of the procedure with the patient including bleeding bruising and infection he elects to proceed and has given written consent Patient placed in a supine position palm up with the hand at the side. Area was scant using 12 Hz linear transducer. Biceps tendon just distal to the groove was identified. Area prepped with Betadine. Needle track was anesthetize with 1% lidocaine x2 ML. The tendon sheath was entered and injection with Celestone 6 mg per mL x0.5 mL as well as lidocaine 1% times one ML. Patient tolerated procedure well. Post procedure instructions given.  Return to clinic one month for followup 

## 2014-12-18 NOTE — Patient Instructions (Addendum)
It is absolutely OK to have sex. Not only is it good exercise, it's an excellent way to connect intimately with your wife. If you have difficulty getting or maintaining an erection, we can discuss medication to help with that.  Talk with Dr. Larna DaughtersKirstens about increasing the dose of the gabapentin, to see if you can get more benefit from a higher dose.

## 2014-12-18 NOTE — Progress Notes (Signed)
Subjective:    Patient ID: [name], male    DOB: Nov 27, 1955, 59 y.o.   MRN: [id number]  Chief Complaint  Patient presents with  . PreDiabetic  . Hyperlipidemia  . depression screen 12    HPI Patient presents today for 35-month follow-up of depression, tobacco dependence, and history of CVA.  Patient had a right MCA stroke in June, was then readmitted d/t increasing left body symptoms. Follow-up MRI showed extension of prior stroke, has occluded right internal carotid artery. Loop recorder was placed, and patient started outpatient PT/OT. Discharged on aspirin and Plavix.  Diagnosed with runs of atrial fibrillation in July, started on Xarelto. Last seen on 10/30/14 by Deboraha Sprang for his annual visit, at which time patient was starting to feel depressed and expressed wanting to restart smoking and drinking.  Patient checks BP at home occasionally, hasn't done so in about a month.   Seeing cardiology tomorrow to take out loop recorder. Finished with PT/OT. Left arm and anterior shoulder numbness and tingling have been stable, no improvement on gabapentin.  Doesn't use his left arm at all unless he needs to. Reports increased swelling of the left hand. Left face doing better, but still feels like sandpaper and is hard to sleep on that side.   Not walking or doing any other exercise d/t fear of causing another stroke. Diet poor, reports he's lost interest in eating. Eats out at [employer] several times per week. Sleeping poor, wakes up early in the morning thinking about things he wishes he could do like hitting a golf ball, but ends up not doing them.   Would like to talk about anti-depressant therapy. Feels stressed about his recent stroke, his marriage, and his overall loss of independence and being out of work. Would like to get back to working at his job as a Merchandiser, retail at [employer], which he had been doing for years.  Patient started smoking again in September. Has  gradually picked up to smoking approx. 0.5 pack/day. Has nicotine patches at home, but hasn't been using them. Drinks 1 can of beer daily. Drank more this past weekend at the [place] race.   Would like to know if he can start Viagra. Patient still interested in sex, but his wife is concerned about his heart and wants to know if it is safe to take given his past medical history.     No other concerns on today's visit.   Review of Systems  Constitutional: Positive for activity change (decreased d/t left arm numbness/tingling; has not been walking  or exercise) and appetite change (decreased interest in eating). Negative for fever and chills.  Respiratory: Negative for cough.   Cardiovascular: Negative for chest pain and palpitations.  Neurological: Positive for numbness (left anterior shoulder and arm). Negative for dizziness, facial asymmetry, speech difficulty and headaches.  Psychiatric/Behavioral: Positive for sleep disturbance and dysphoric mood (down about his current situation, loss of independence; bored at home).   Patient Active Problem List   Diagnosis Date Noted  . Cerebral infarction due to thrombosis of right anterior cerebral artery (HCC) 10/29/2014  . Hypersomnia with sleep apnea 10/29/2014  . Embolic stroke involving right middle cerebral artery (HCC) 10/29/2014  . Nicotine use disorder 10/29/2014  . Left anterior shoulder pain 10/23/2014  . Alterations of sensations following CVA (cerebrovascular accident) 09/10/2014  . Atrial fibrillation (HCC) 09/06/2014  . Left spastic hemiparesis (HCC) 08/07/2014  . Left-sided neglect 08/07/2014  . Right middle cerebral artery  stroke (HCC) 08/01/2014  . Tobacco dependence   . Carotid artery occlusion with infarction (HCC)   . History of ETOH abuse   . Hyperlipidemia   . Essential hypertension, benign 10/11/2013   Family History  Problem Relation Age of Onset  . Cancer Father     leukemia  . Heart attack Brother   .  Hypertension Brother    Social History   Social History  . Marital Status: Married    Spouse Name: [name]  . Number of Children: 3  . Years of Education: 9   Occupational History  . meat cutter    Social History Main Topics  . Smoking status: Current Every Day Smoker -- 0.50 packs/day for 40 years    Types: Cigarettes  . Smokeless tobacco: Never Used     Comment: 10/03/14 currently smoking 1 cig a day  . Alcohol Use: 1.2 oz/week    2 Cans of beer per week     Comment: 10/03/14 one beer every week or two  . Drug Use: No  . Sexual Activity: Yes   Other Topics Concern  . Not on file   Social History Narrative   Lives at home with wife, [name]   Caffeine use - tea 4-5 glasses a day   Prior to Admission medications   Medication Sig Start Date End Date Taking? Authorizing Provider  atorvastatin (LIPITOR) 80 MG tablet TAKE 1 TABLET BY MOUTH DAILY AT 6 PM 09/25/14  Yes Ethelda Chick, MD  gabapentin (NEURONTIN) 300 MG capsule Take 1 capsule (300 mg total) by mouth 3 (three) times daily. 11/20/14  Yes Erick Colace, MD  lisinopril (PRINIVIL,ZESTRIL) 5 MG tablet Take 1 tablet (5 mg total) by mouth daily. 10/17/14  Yes Chelle Jeffery, PA-C  polyethylene glycol (MIRALAX / GLYCOLAX) packet Take 17 g by mouth daily. 08/01/14  Yes Ashly Hulen Skains, DO  rivaroxaban (XARELTO) 20 MG TABS tablet Take 1 tablet (20 mg total) by mouth daily with supper. 09/26/14  Yes Marinus Maw, MD    No Known Allergies    Objective:   Physical Exam  Constitutional: He is oriented to person, place, and time. He appears well-developed and well-nourished. No distress.  BP 124/85 mmHg  Pulse 74  Temp(Src) 97.7 F (36.5 C) (Oral)  Resp 16  Wt 178 lb 3.2 oz (80.831 kg)  HENT:  Head: Normocephalic and atraumatic.  Eyes: EOM are normal. No scleral icterus.  Neck: Neck supple.  Cardiovascular: Normal rate, regular rhythm, normal heart sounds and intact distal pulses.  Exam reveals no gallop and no friction  rub.   No murmur heard. Pulmonary/Chest: Effort normal and breath sounds normal. No respiratory distress. He has no wheezes. He has no rales.  Musculoskeletal: He exhibits no edema.  Decreased ROM in left shoulder; raises left arm to approx. 30 degrees (right arm to 170 degrees). 3-4/5 hand/grip strength on the left (5/5 on the right).   Lymphadenopathy:    He has no cervical adenopathy.  Neurological: He is alert and oriented to person, place, and time.  Skin: Skin is warm and dry. No erythema.  Psychiatric:  Depressed since his stroke and losing his independence.       Assessment & Plan:  1. Depression due to stroke (HCC) - buPROPion (WELLBUTRIN XL) 150 MG 24 hr tablet; Take 1 tablet (150 mg total) by mouth daily.  Dispense: 90 tablet; Refill: 3  2. Tobacco dependence - Discussed importance of smoking cessation to prevent another stroke  or cardiovascular event.   3. History of ETOH abuse - Discussed importance of quitting drinking to improve blood sugars and overall mood.   4. Cerebrovascular accident (CVA) due to thrombosis of right anterior cerebral artery (HCC) - Continue Xarelto.   5. Paroxysmal atrial fibrillation [facility](HCC) - Cardiology following. Loop recorder out tomorrow.   6. Essential hypertension, benign - Continue lisinopril 5 mg daily.

## 2014-12-19 ENCOUNTER — Other Ambulatory Visit: Payer: Self-pay | Admitting: *Deleted

## 2014-12-19 MED ORDER — GABAPENTIN 300 MG PO CAPS: 300.0000 mg | ORAL_CAPSULE | Freq: Three times a day (TID) | ORAL | Status: DC

## 2014-12-20 ENCOUNTER — Encounter (HOSPITAL_COMMUNITY)
Admission: RE | Disposition: A | Discharge status: Home / Self Care | Payer: Self-pay | Source: Ambulatory Visit | Attending: Internal Medicine

## 2014-12-20 ENCOUNTER — Encounter (HOSPITAL_COMMUNITY): Payer: Self-pay | Admitting: Internal Medicine

## 2014-12-20 ENCOUNTER — Ambulatory Visit (HOSPITAL_COMMUNITY)
Admission: RE | Admit: 2014-12-20 | Discharge: 2014-12-20 | Disposition: A | Discharge status: Home / Self Care | Payer: BLUE CROSS/BLUE SHIELD | Source: Ambulatory Visit | Attending: Internal Medicine | Admitting: Internal Medicine

## 2014-12-20 DIAGNOSIS — Z4509 Encounter for adjustment and management of other cardiac device: Secondary | ICD-10-CM | POA: Insufficient documentation

## 2014-12-20 DIAGNOSIS — Z8249 Family history of ischemic heart disease and other diseases of the circulatory system: Secondary | ICD-10-CM | POA: Diagnosis not present

## 2014-12-20 DIAGNOSIS — E785 Hyperlipidemia, unspecified: Secondary | ICD-10-CM | POA: Insufficient documentation

## 2014-12-20 DIAGNOSIS — M1990 Unspecified osteoarthritis, unspecified site: Secondary | ICD-10-CM | POA: Diagnosis not present

## 2014-12-20 DIAGNOSIS — I4891 Unspecified atrial fibrillation: Secondary | ICD-10-CM | POA: Diagnosis present

## 2014-12-20 DIAGNOSIS — I10 Essential (primary) hypertension: Secondary | ICD-10-CM | POA: Diagnosis not present

## 2014-12-20 DIAGNOSIS — F329 Major depressive disorder, single episode, unspecified: Secondary | ICD-10-CM | POA: Diagnosis not present

## 2014-12-20 DIAGNOSIS — I480 Paroxysmal atrial fibrillation: Secondary | ICD-10-CM | POA: Diagnosis not present

## 2014-12-20 DIAGNOSIS — Z7901 Long term (current) use of anticoagulants: Secondary | ICD-10-CM | POA: Diagnosis not present

## 2014-12-20 DIAGNOSIS — Z8673 Personal history of transient ischemic attack (TIA), and cerebral infarction without residual deficits: Secondary | ICD-10-CM | POA: Diagnosis not present

## 2014-12-20 DIAGNOSIS — F17210 Nicotine dependence, cigarettes, uncomplicated: Secondary | ICD-10-CM | POA: Insufficient documentation

## 2014-12-20 DIAGNOSIS — I639 Cerebral infarction, unspecified: Secondary | ICD-10-CM

## 2014-12-20 HISTORY — PX: EP IMPLANTABLE DEVICE: SHX172B

## 2014-12-20 SURGERY — LOOP RECORDER REMOVAL
Anesthesia: LOCAL

## 2014-12-20 MED ORDER — LIDOCAINE-EPINEPHRINE 1 %-1:100000 IJ SOLN
INTRAMUSCULAR | Status: AC
  Filled 2014-12-20: qty 1

## 2014-12-20 MED ORDER — LIDOCAINE-EPINEPHRINE 1 %-1:100000 IJ SOLN
INTRAMUSCULAR | Status: DC | PRN
  Administered 2014-12-20: 13 mL via INTRADERMAL

## 2014-12-20 SURGICAL SUPPLY — 1 items: PACK LOOP INSERTION (CUSTOM PROCEDURE TRAY) ×2 IMPLANT

## 2014-12-20 NOTE — H&P (Signed)
Electrophysiology Office Note Date: 12/10/2014  ID: [name], DOB 08-28-1955, MRN [id number]  PCP: JEFFERY,CHELLE, PA-C Electrophysiologist: Ladona Ridgel  CC: atrial fibrillation follow up  [name] is a 59 y.o. male seen today for Dr Ladona Ridgel. He was admitted with cryptogenic stroke in 07/2014 and had ILR placed that admission. Atrial fibrillation was detected in July of 2016 and he was initiated on Xarelto. Since last being seen in our clinic, the patient reports doing reasonably well. He is struggling with residual stroke symptoms and is drinking and smoking more due to depression. He denies chest pain, palpitations, dyspnea, PND, orthopnea, nausea, vomiting, dizziness, syncope. He has persistent left arm limited range of motion and numbness. He wonders if some of his arm pain is related to [facility]  Past Medical History  Diagnosis Date  . Arthritis   . Hypertension   . Hyperlipidemia   . Cor triatriatum     a. identified on TEE at time of stroke  . Paroxysmal atrial fibrillation (HCC)     a. identified on LINQ 08/2014  . Stroke (HCC) 07/25/14    a. s/p MDT ILR implant   . Alcohol use with alcohol-induced mood disorder [facility])    Past Surgical History  Procedure Laterality Date  . Loop recorder implant  07/27/14    LOOP REVEAL LINQ [id number] - [id number]  . Tee without cardioversion N/A 07/27/2014    Procedure: TRANSESOPHAGEAL ECHOCARDIOGRAM (TEE); Surgeon: Chrystie Nose, MD; Location: [facility] ENDOSCOPY; Service: Cardiovascular; Laterality: N/A;  . Ep implantable device N/A 07/27/2014    Procedure: Loop Recorder Insertion; Surgeon: Marinus Maw, MD; Location: [facility] INVASIVE CV LAB; Service: Cardiovascular; Laterality: N/A;    Current Outpatient Prescriptions  Medication Sig Dispense Refill  . atorvastatin (LIPITOR) 80 MG tablet TAKE 1 TABLET BY MOUTH DAILY AT 6 PM 90 tablet 3  . gabapentin (NEURONTIN) 300 MG  capsule Take 1 capsule (300 mg total) by mouth 3 (three) times daily. 90 capsule 1  . lisinopril (PRINIVIL,ZESTRIL) 5 MG tablet Take 1 tablet (5 mg total) by mouth daily. 90 tablet 3  . polyethylene glycol (MIRALAX / GLYCOLAX) packet Take 17 g by mouth daily. 14 each 0  . rivaroxaban (XARELTO) 20 MG TABS tablet Take 1 tablet (20 mg total) by mouth daily with supper. 90 tablet 3   No current facility-administered medications for this visit.    Allergies: Review of patient's allergies indicates no known allergies.   Social History: Social History   Social History  . Marital Status: Married    Spouse Name: Marylu Lund  . Number of Children: 3  . Years of Education: 9   Occupational History  . meat cutter    Social History Main Topics  . Smoking status: Current Every Day Smoker -- 0.50 packs/day for 40 years    Types: Cigarettes  . Smokeless tobacco: Never Used     Comment: 10/03/14 currently smoking 1 cig a day  . Alcohol Use: 1.2 oz/week    2 Cans of beer per week     Comment: 10/03/14 one beer every week or two  . Drug Use: No  . Sexual Activity: Yes   Other Topics Concern  . Not on file   Social History Narrative   Lives at home with wife, Marylu Lund   Caffeine use - tea 4-5 glasses a day    Family History: Family History  Problem Relation Age of Onset  . Cancer Father     leukemia  . Heart attack Brother   .  Hypertension Brother     Review of Systems: All other systems reviewed and are otherwise negative except as noted above.   Physical Exam: VS: BP 140/80 mmHg  Pulse 100  Ht 5\' 6"  (1.676 m)  Wt 177 lb 12.8 oz (80.65 kg)  BMI 28.71 kg/m2  SpO2 97% , BMI Body mass index is 28.71 kg/(m^2). Wt Readings from Last 3 Encounters:  12/10/14 177 lb 12.8 oz (80.65 kg)  10/30/14 180 lb (81.647 kg)  10/29/14 182 lb (82.555 kg)    GEN- The patient is well  appearing, alert and oriented x 3 today  HEENT: normocephalic, atraumatic; sclera clear, conjunctiva pink; hearing intact; oropharynx clear; neck supple  Lungs- Clear to ausculation bilaterally, normal work of breathing. No wheezes, rales, rhonchi Heart- Regular rate and rhythm, no murmurs, rubs or gallops  GI- soft, non-tender, non-distended, bowel sounds present Extremities- no clubbing, cyanosis, or edema; DP/PT/radial pulses 2+ bilaterally MS- no significant deformity or atrophy, decreased ROM LUE Skin- warm and dry, no rash or lesion  Psych- euthymic mood, full affect  EKG: EKG is not ordered today.   Recent Labs: 07/31/2014: TSH 3.313 08/02/2014: ALT 26 09/03/2014: BUN 18; Creatinine, Ser 0.73; Hemoglobin 14.6; Platelets 306.0; Potassium 4.1; Sodium 135    Other studies Reviewed: Additional studies/ records that were reviewed today include: Dr Lubertha Basqueaylor's notes, neurology records  Assessment and Plan: 1. Paroxysmal atrial fibrillation AF burden by device interrogation today 0.4% V rates controlled Continue Xarelto 20mg  1 tablet daily for CHADS2VASC of at least 3 BMET, CBC checked recently by PCP per patient Per neurology, ok to discontinue ASA  He thinks that his shoulder pain is worsened by LINQ. I have reassured him that I do not think this is the case, but he would like LINQ explanted. As we have detected atrial fibrillation and he is adequately anticoagulated with controlled V rates, I think this is reasonable. Will schedule with Dr Ladona Ridgelaylor at the next available time  2. HTN Stable No change required today  3. Depression The patient states that he is depressed which has led to more alcohol and tobacco abuse.  He denies HI/SI today I have encouraged follow up with his PCP to discuss antidepressant therapy He is aware that ETOH will worsen AF burden and we discussed the fact that complete smoking cessation is advised He has a spouse who is very caring and  supportive, but she is also struggling with the patient's stroke and residual symptoms   Current medicines are reviewed at length with the patient today.  The patient does not have concerns regarding his medicines. The following changes were made today: none  Labs/ tests ordered today include: none   Disposition: Follow up with me in 6 months   Signed, Gypsy BalsamAmber Seiler, NP 12/10/2014 10:38 AM   [facility] [address] [place] [address] [phone] (office) [phone] (fax)     EP Attending  Patient seen and examined. Agree with above. Since last clinic visit, no change in the history, exam, assessment and plan.   Leonia ReevesGregg Neils [place],M.D.

## 2014-12-20 NOTE — Discharge Instructions (Signed)
Incision Care ° An incision (cut) is when a surgeon cuts into your body. After surgery, the cut needs to be well cared for to keep it from getting infected.  °HOW TO CARE FOR YOUR CUT °· Take medicines only as told by your doctor. °· There are many different ways to close and cover a cut, including stitches, skin glue, and adhesive strips. Follow your doctor's instructions on: °¨ Care of the cut. °¨ Bandage (dressing) changes and removal. °¨ Cut closure removal. °· Do not take baths, swim, or use a hot tub until your doctor says it is okay. You may shower as told by your doctor. °· Return to your normal diet and activities as allowed by your doctor. °· Use medicine that helps lessen itching on your cut as told by your doctor. Do not pick or scratch at your cut. °· Drink enough fluids to keep your pee (urine) clear or pale yellow. °GET HELP IF: °· You have redness, puffiness (swelling), or pain at the site of your cut. °· You have fluid, blood, or pus coming from your cut. °· Your muscles ache. °· You have chills or you feel sick. °· You have a bad smell coming from the cut or bandage. °· Your cut opens up after stitches, staples, or adhesive strips have been removed. °· You keep feeling sick to your stomach (nauseous) or keep throwing up (vomiting). °· You have a fever. °· You are dizzy. °GET HELP RIGHT AWAY IF: °· You have a rash. °· You pass out (faint). °· You have trouble breathing. °MAKE SURE YOU:  °· Understand these instructions. °· Will watch your condition. °· Will get help right away if you are not doing well or get worse. °  °This information is not intended to replace advice given to you by your health care provider. Make sure you discuss any questions you have with your health care provider. °  °Document Released: 04/27/2011 Document Revised: 02/23/2014 Document Reviewed: 03/29/2013 °Elsevier Interactive Patient Education ©2016 Elsevier Inc. ° °

## 2014-12-24 ENCOUNTER — Telehealth: Payer: Self-pay | Admitting: Internal Medicine

## 2014-12-24 ENCOUNTER — Ambulatory Visit (INDEPENDENT_AMBULATORY_CARE_PROVIDER_SITE_OTHER): Payer: BLUE CROSS/BLUE SHIELD | Admitting: *Deleted

## 2014-12-24 DIAGNOSIS — I639 Cerebral infarction, unspecified: Secondary | ICD-10-CM | POA: Diagnosis not present

## 2014-12-24 NOTE — Telephone Encounter (Signed)
Returned call. Ms. Longest reports that the incision is closed and that she re-covered it after cleaning with a bandaid. Advised to call back if incision becomes red or swollen or if drainage is noted. She verbalizes understanding. Will send return kit for Carelink monitor.

## 2014-12-24 NOTE — Telephone Encounter (Signed)
New Message  Pt wife called- pt had loop removal on 11/3-  pt has accidentally removed entire bandage. Pt wife requested to speak w/ Device on how to proceed. Please call back and discuss.

## 2014-12-25 NOTE — Progress Notes (Signed)
Carelink Summary Report / Loop recorder 

## 2015-01-03 ENCOUNTER — Ambulatory Visit (INDEPENDENT_AMBULATORY_CARE_PROVIDER_SITE_OTHER): Payer: BLUE CROSS/BLUE SHIELD | Admitting: *Deleted

## 2015-01-03 ENCOUNTER — Encounter: Payer: Self-pay | Admitting: Cardiology

## 2015-01-03 DIAGNOSIS — Z9889 Other specified postprocedural states: Secondary | ICD-10-CM

## 2015-01-03 NOTE — Progress Notes (Signed)
Wound check in clinic s/p LINQ explant 12/20/14 by Dr. Lovena Le. Wound well healed, scabbing noted over incision. No redness, swelling or drainage. Pt concerned about feeling a "knot" where LINQ was, advised the patient that a fibrous scar would remain. Home monitor return kit ordered from [employer]. ROV with GT PRN.

## 2015-01-11 ENCOUNTER — Telehealth: Payer: Self-pay

## 2015-01-11 LAB — CUP PACEART REMOTE DEVICE CHECK: MDC IDC SESS DTM: 20161107200805

## 2015-01-11 MED ORDER — COLCHICINE 0.6 MG PO TABS: 0.6000 mg | ORAL_TABLET | Freq: Two times a day (BID) | ORAL | Status: DC | PRN

## 2015-01-11 NOTE — Telephone Encounter (Signed)
Meds ordered this encounter  Medications  . colchicine 0.6 MG tablet    Sig: Take 1 tablet (0.6 mg total) by mouth 2 (two) times daily as needed (gout).    Dispense:  20 tablet    Refill:  0    Order Specific Question:  Supervising Provider    Answer:  DOOLITTLE, ROBERT P [[id number]]

## 2015-01-11 NOTE — Telephone Encounter (Signed)
Pt wanted [name] to know that gout has started on his left foot now. [facility] not [facility] its with [employer]? [facility] [address]  Please advise  [phone]

## 2015-01-12 NOTE — Telephone Encounter (Signed)
Advised pt

## 2015-01-15 ENCOUNTER — Ambulatory Visit: Payer: BLUE CROSS/BLUE SHIELD | Admitting: Physical Medicine & Rehabilitation

## 2015-01-22 ENCOUNTER — Encounter: Payer: BLUE CROSS/BLUE SHIELD | Attending: Physical Medicine & Rehabilitation

## 2015-01-22 ENCOUNTER — Telehealth: Payer: Self-pay | Admitting: *Deleted

## 2015-01-22 ENCOUNTER — Encounter: Payer: Self-pay | Admitting: Physician Assistant

## 2015-01-22 ENCOUNTER — Ambulatory Visit (INDEPENDENT_AMBULATORY_CARE_PROVIDER_SITE_OTHER): Payer: BLUE CROSS/BLUE SHIELD | Admitting: Physician Assistant

## 2015-01-22 ENCOUNTER — Ambulatory Visit (HOSPITAL_BASED_OUTPATIENT_CLINIC_OR_DEPARTMENT_OTHER): Payer: BLUE CROSS/BLUE SHIELD | Admitting: Physical Medicine & Rehabilitation

## 2015-01-22 ENCOUNTER — Encounter: Payer: Self-pay | Admitting: Physical Medicine & Rehabilitation

## 2015-01-22 VITALS — BP 115/72 | HR 83 | Temp 97.8°F | Resp 16 | Ht 66.5 in | Wt 181.0 lb

## 2015-01-22 VITALS — BP 122/74 | HR 79 | Resp 14

## 2015-01-22 DIAGNOSIS — F0631 Mood disorder due to known physiological condition with depressive features: Secondary | ICD-10-CM

## 2015-01-22 DIAGNOSIS — I69898 Other sequelae of other cerebrovascular disease: Secondary | ICD-10-CM

## 2015-01-22 DIAGNOSIS — F17200 Nicotine dependence, unspecified, uncomplicated: Secondary | ICD-10-CM | POA: Insufficient documentation

## 2015-01-22 DIAGNOSIS — E785 Hyperlipidemia, unspecified: Secondary | ICD-10-CM | POA: Insufficient documentation

## 2015-01-22 DIAGNOSIS — I639 Cerebral infarction, unspecified: Secondary | ICD-10-CM

## 2015-01-22 DIAGNOSIS — I69398 Other sequelae of cerebral infarction: Secondary | ICD-10-CM

## 2015-01-22 DIAGNOSIS — I10 Essential (primary) hypertension: Secondary | ICD-10-CM | POA: Diagnosis not present

## 2015-01-22 DIAGNOSIS — M1990 Unspecified osteoarthritis, unspecified site: Secondary | ICD-10-CM | POA: Insufficient documentation

## 2015-01-22 DIAGNOSIS — R208 Other disturbances of skin sensation: Secondary | ICD-10-CM

## 2015-01-22 DIAGNOSIS — G8114 Spastic hemiplegia affecting left nondominant side: Secondary | ICD-10-CM

## 2015-01-22 DIAGNOSIS — IMO0002 Reserved for concepts with insufficient information to code with codable children: Secondary | ICD-10-CM

## 2015-01-22 DIAGNOSIS — I69354 Hemiplegia and hemiparesis following cerebral infarction affecting left non-dominant side: Secondary | ICD-10-CM | POA: Diagnosis present

## 2015-01-22 DIAGNOSIS — R414 Neurologic neglect syndrome: Secondary | ICD-10-CM | POA: Diagnosis not present

## 2015-01-22 DIAGNOSIS — E7800 Pure hypercholesterolemia, unspecified: Secondary | ICD-10-CM | POA: Diagnosis not present

## 2015-01-22 DIAGNOSIS — M25512 Pain in left shoulder: Secondary | ICD-10-CM | POA: Diagnosis not present

## 2015-01-22 DIAGNOSIS — R209 Unspecified disturbances of skin sensation: Principal | ICD-10-CM

## 2015-01-22 MED ORDER — TRAMADOL HCL 50 MG PO TABS: 50.0000 mg | ORAL_TABLET | Freq: Every evening | ORAL | Status: DC | PRN

## 2015-01-22 MED ORDER — BUPROPION HCL ER (XL) 300 MG PO TB24: 300.0000 mg | ORAL_TABLET | Freq: Every day | ORAL | Status: DC

## 2015-01-22 MED ORDER — FENOFIBRATE 160 MG PO TABS: 160.0000 mg | ORAL_TABLET | Freq: Every day | ORAL | Status: DC

## 2015-01-22 NOTE — Progress Notes (Signed)
Subjective:    Patient ID: [name], male    DOB: [birth year], 59 y.o.   MRN: [id number] 59 year old male with right MCA distribution infarct with left hemiparesis onset 07/25/2014. HPI Patient has left shoulder pain. He did not get good relief with a subacromial injection performed 10/23/2014. He also did not have a good relief with the left bicipital tendon sheath injection performed on 12/18/2014. His pain is in the left anterior shoulder he also has nighttime pain especially when he sleeps on that side. He has finished outpatient PT and OT. 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 Also unable to drive due to neglect  Pain Inventory Average Pain 9 Pain Right Now 9 My pain is sharp, burning and tingling  In the last 24 hours, has pain interfered with the following? General activity 5 Relation with others 4 Enjoyment of life 5 What TIME of day is your pain at its worst? night Sleep (in general) Poor  Pain is worse with: sitting Pain improves with: no selection Relief from Meds: 2  Mobility walk without assistance how many minutes can you walk? 15-20 ability to climb steps?  yes do you drive?  no  Function I need assistance with the following:  dressing, meal prep and household duties  Neuro/Psych weakness numbness tingling loss of taste or smell  Prior Studies Any changes since last visit?  no  Physicians involved in your care Any changes since last visit?  no   Family History  Problem Relation Age of Onset  . Cancer Father     leukemia  . Heart attack Brother   . Hypertension Brother    Social History   Social History  . Marital Status: Married    Spouse Name: Marylu LundJanet  . Number of Children: 3  . 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 Smoking status:  Current Every Day Smoker -- 0.50 packs/day for 40 years    Types: Cigarettes  . Smokeless tobacco: Never Used     Comment: cut back after stroke, but increased again due to depression  . Alcohol Use: 0.0 - 1.2 oz/week    0-2 Cans of beer per week     Comment: <12/week  . Drug Use: No  . Sexual Activity:    Partners: Female   Other Topics Concern  . None   Social History Narrative   Lives at home with wife, Marylu LundJanet   Caffeine use - tea 4-5 glasses a day   Past Surgical History  Procedure Laterality Date  . Loop recorder implant  07/27/14    LOOP REVEAL LINQ [id number] - [id number]  . Tee without cardioversion N/A 07/27/2014    Procedure: TRANSESOPHAGEAL ECHOCARDIOGRAM (TEE);  Surgeon: Chrystie NoseKenneth C Hilty, MD;  [facility];  Service: Cardiovascular;  Laterality: N/A;  . Ep implantable device N/A 07/27/2014    Procedure: Loop Recorder Insertion;  Surgeon: Marinus MawGregg W Taylor, MD;  [facility];  Service: Cardiovascular;  Laterality: N/A;  . Ep implantable device N/A 12/20/2014    Procedure: Loop Recorder Removal;  Surgeon: Marinus MawGregg W Taylor, MD;  [facility];  Service: Cardiovascular;  Laterality: N/A;   Past Medical History  Diagnosis Date  . Arthritis   .  Hypertension   . Hyperlipidemia   . Cor triatriatum     a. identified on TEE at time of stroke  . Paroxysmal atrial fibrillation (HCC)     a. identified on LINQ 08/2014  . Stroke (HCC) 07/25/14    a. s/p MDT ILR implant   . Alcohol use with alcohol-induced mood disorder (HCC)    BP 122/74 mmHg  Pulse 79  Resp 14  SpO2 97%  Opioid Risk Score:   Fall Risk Score:  `1  Depression screen PHQ 2/9  Depression screen [facility] 2/9 01/22/2015 12/18/2014 10/30/2014 10/29/2014 09/10/2014 09/06/2014 05/10/2014  Decreased Interest 0 0 0  Down, Depressed, Hopeless 0 0  PHQ - 2 Score 0 0  Altered sleeping 0 - -  Tired, decreased energy - -  Change in appetite - - 0 1 0 - -    Feeling bad or failure about yourself  0 1 - -  Trouble concentrating 0 0 1 1 0 - -  Moving slowly or fidgety/restless 0 0 0 0 1 - -  Suicidal thoughts 0 0 0 0 0 - -  PHQ-9 Score - -  Difficult doing work/chores - - - Not difficult at all - - -     Review of Systems  Neurological: Positive for weakness and numbness.       Tingling loss of taste  All other systems reviewed and are negative.      Objective:   Physical Exam  Constitutional: He is oriented to person, place, and time. He appears well-developed and well-nourished.  HENT:  Head: Normocephalic and atraumatic.  Eyes: Conjunctivae and EOM are normal. Pupils are equal, round, and reactive to light.  Neck: Normal range of motion.  Musculoskeletal:       Left shoulder: He exhibits decreased range of motion. He exhibits no tenderness, no effusion, no crepitus, no deformity and no spasm.  Negative impingement sign left shoulder No tenderness over the before meals joint, no tenderness with adduction Motor strength is 3 minus at the left deltoid 3 at the biceps triceps and finger flexors.   Neurological: He is alert and oriented to person, place, and time. Coordination abnormal. Gait normal.  Sensation is absent to light touch in the left upper extremity reduced to light touch in the left lower extremity and reduced to light touch in the left hemifacial area  No evidence of spasticity in the left upper extremity or left pectoralis area  Poor fine motor, manual dexterity skills in left upper extremity  Psychiatric: He has a normal mood and affect.  Nursing note and vitals reviewed.         Assessment & Plan:  1. Right MCA Distribution infarct with left hemiparesis left neglect and left hemisensory deficits.  2. Hemiplegic left shoulder pain, multifactorial, becoming chronic. We'll check musculoskeletal ultrasound left upper extremity. May be a good candidate for percutaneous peripheral nerve  stimulation

## 2015-01-22 NOTE — Telephone Encounter (Signed)
Wife called with a question about the [facility]  They have the Rx.  I suggested she call them and see what they want her to do.  Sometimes they accept faxes from pt.  She will call back if we need to call the mail order pharm.

## 2015-01-22 NOTE — Progress Notes (Signed)
Patient ID: [name], male    DOB: [birth year], 59 y.o.   MRN: [id number]  PCP: Saxon Barich, PA-C  Subjective:   Chief Complaint  Patient presents with  . Follow-up    x 1 month with fasting labwork  . Depression    answers positive    HPI Presents for evaluation of depression since starting Wellbutrin 1 month ago.  Is doing better objectively and subjectively on the Wellbutrin. Thinks it could do better at a higher dose.   Continues to wait for disability. Is thinking about retaining an attorney to help. Dr. Larna DaughtersKirstens has not cleared him to return to work, and doesn't expect to, as he was previously employed as a Merchandiser, retailmeat cutter. Unfortunately, he missed his visit on 11/29, because he thought it was today.  He was scheduled for a sleep study, but didn't go due to his insurance not covering it.  We reviewed his labs from his visit several months ago. Triglycerides are >250. He's already on the highest dose of Lipitor. Continues to contemplate quitting smoking again, and is drinking less than 12 beers/day.   [facility] 2/9 01/22/2015 12/18/2014 10/30/2014 10/29/2014 09/10/2014  Decreased Interest 0 3 3 1 3   Down, Depressed, Hopeless 0 3 3 1 1   PHQ - 2 Score 0 6 6 2 4   Altered sleeping 0 3 2 2 1   Tired, decreased energy 2 3 2 2 1   Change in appetite - - 0 1 0  Feeling bad or failure about yourself  2 3 1  0 1  Trouble concentrating 0 0 1 1 0  Moving slowly or fidgety/restless 0 0 0 0 1  Suicidal thoughts 0 0 0 0 0  PHQ-9 Score 4 15 12 8 8   Difficult doing work/chores - - - Not difficult at all -     Review of Systems As above.    Patient Active Problem List   Diagnosis Date Noted  . Cerebral infarction due to thrombosis of right anterior cerebral artery (HCC) 10/29/2014  . Hypersomnia with sleep apnea 10/29/2014  . Embolic stroke involving right middle cerebral artery (HCC) 10/29/2014  . Nicotine use disorder 10/29/2014  . Left anterior shoulder pain  10/23/2014  . Alterations of sensations following CVA (cerebrovascular accident) 09/10/2014  . Atrial fibrillation (HCC) 09/06/2014  . Left spastic hemiparesis (HCC) 08/07/2014  . Left-sided neglect 08/07/2014  . Right middle cerebral artery stroke (HCC) 08/01/2014  . Tobacco dependence   . Carotid artery occlusion with infarction (HCC)   . History of ETOH abuse   . Hyperlipidemia   . Essential hypertension, benign 10/11/2013     Prior to Admission medications   Medication Sig Start Date End Date Taking? Authorizing Provider  atorvastatin (LIPITOR) 80 MG tablet TAKE 1 TABLET BY MOUTH DAILY AT 6 PM 09/25/14  Yes Ethelda ChickKristi M Fadden, MD  buPROPion (WELLBUTRIN XL) 150 MG 24 hr tablet Take 1 tablet (150 mg total) by mouth daily. 12/18/14  Yes Leesa Leifheit, PA-C  colchicine 0.6 MG tablet Take 1 tablet (0.6 mg total) by mouth 2 (two) times daily as needed (gout). 01/11/15  Yes Aayat Hajjar, PA-C  gabapentin (NEURONTIN) 300 MG capsule Take 1 capsule (300 mg total) by mouth 3 (three) times daily. 12/19/14  Yes Erick ColaceAndrew E Kirsteins, MD  lisinopril (PRINIVIL,ZESTRIL) 5 MG tablet Take 1 tablet (5 mg total) by mouth daily. 10/17/14  Yes Betty Daidone, PA-C  rivaroxaban (XARELTO) 20 MG TABS tablet Take 1 tablet (20 mg total) by mouth daily  with supper. 09/26/14  Yes Marinus Maw, MD     No Known Allergies     Objective:  Physical Exam  Constitutional: He is oriented to person, place, and time. Vital signs are normal. He appears well-developed and well-nourished. He is active and cooperative. No distress.  BP 115/72 mmHg  Pulse 83  Temp(Src) 97.8 F (36.6 C) (Oral)  Resp 16  Ht 5' 6.5" (1.689 m)  Wt 181 lb (82.101 kg)  BMI 28.78 kg/m2  HENT:  Head: Normocephalic and atraumatic.  Right Ear: Hearing normal.  Left Ear: Hearing normal.  Eyes: Conjunctivae are normal. No scleral icterus.  Neck: Normal range of motion. Neck supple. No thyromegaly present.  Cardiovascular: Normal rate, regular  rhythm and normal heart sounds.   Pulses:      Radial pulses are 2+ on the right side, and 2+ on the left side.  Pulmonary/Chest: Effort normal and breath sounds normal.  Lymphadenopathy:       Head (right side): No tonsillar, no preauricular, no posterior auricular and no occipital adenopathy present.       Head (left side): No tonsillar, no preauricular, no posterior auricular and no occipital adenopathy present.    He has no cervical adenopathy.       Right: No supraclavicular adenopathy present.       Left: No supraclavicular adenopathy present.  Neurological: He is alert and oriented to person, place, and time. No sensory deficit.  Skin: Skin is warm, dry and intact. No rash noted. No cyanosis or erythema. Nails show no clubbing.  Psychiatric: He has a normal mood and affect. His speech is normal and behavior is normal. Judgment and thought content normal. Cognition and memory are normal.  Turns to the RIGHT and withdraws when I am close to him, relaxes when I move away.           Assessment & Plan:   1. Depression due to stroke (HCC) Increase Wellbutrin XL to 300 mg daily. - buPROPion (WELLBUTRIN XL) 300 MG 24 hr tablet; Take 1 tablet (300 mg total) by mouth daily.  Dispense: 90 tablet; Refill: 3  2. Tobacco dependence Encouraged continued efforts for smoking cessation, specifically to reduce the risk of another stroke.  3. Hypercholesterolemia Add fenofibrate. Needs aggressive lipid lowering to reduce recurrent stroke risk. Not fasting today. - fenofibrate 160 MG tablet; Take 1 tablet (160 mg total) by mouth daily.  Dispense: 90 tablet; Refill: 3   Return in about 1 month (around 02/22/2015) for re-evaluation of depression. Will get fasting labs then, as he is not fasting today.    Fernande Bras, PA-C Physician Assistant-Certified [facility]

## 2015-01-22 NOTE — Patient Instructions (Addendum)
Call Dr. Larna DaughtersKirstens' office to reschedule there. You had an appointment on 01/15/15. When you can, reschedule the sleep study.  You can use up the Wellbutrin (bupropion) 150 mg you have left by taking 2 each day. The new prescription is for 300 mg.

## 2015-01-22 NOTE — Patient Instructions (Signed)
Ultrasound of shoulder

## 2015-01-23 NOTE — Telephone Encounter (Signed)
Patient called in and states that he needed us to call in medication to [facility] at [phone]. I have called in medication and patient was advised that I have done so.

## 2015-01-29 ENCOUNTER — Encounter: Payer: Self-pay | Admitting: Internal Medicine

## 2015-02-01 ENCOUNTER — Telehealth: Payer: Self-pay | Admitting: Physician Assistant

## 2015-02-01 NOTE — Telephone Encounter (Signed)
Please call this patient.  The FDA has determited that the benefits of fibric acids for coadministration with statins no longer outweigh the risks.  I'll reach out to his specialists for their recommendations.

## 2015-02-06 NOTE — Telephone Encounter (Signed)
Spoke with pt, advised message. 

## 2015-02-26 ENCOUNTER — Encounter: Payer: Self-pay | Admitting: Physician Assistant

## 2015-02-26 ENCOUNTER — Encounter: Payer: Self-pay | Admitting: Physical Medicine & Rehabilitation

## 2015-02-26 ENCOUNTER — Ambulatory Visit (INDEPENDENT_AMBULATORY_CARE_PROVIDER_SITE_OTHER): Payer: BLUE CROSS/BLUE SHIELD | Admitting: Physician Assistant

## 2015-02-26 ENCOUNTER — Encounter: Payer: BLUE CROSS/BLUE SHIELD | Attending: Physical Medicine & Rehabilitation

## 2015-02-26 ENCOUNTER — Ambulatory Visit (HOSPITAL_BASED_OUTPATIENT_CLINIC_OR_DEPARTMENT_OTHER): Payer: BLUE CROSS/BLUE SHIELD | Admitting: Physical Medicine & Rehabilitation

## 2015-02-26 VITALS — BP 138/94 | HR 70 | Temp 98.4°F | Resp 18 | Ht 66.5 in | Wt 174.0 lb

## 2015-02-26 VITALS — BP 153/94 | HR 85

## 2015-02-26 DIAGNOSIS — Z1211 Encounter for screening for malignant neoplasm of colon: Secondary | ICD-10-CM | POA: Diagnosis not present

## 2015-02-26 DIAGNOSIS — I10 Essential (primary) hypertension: Secondary | ICD-10-CM

## 2015-02-26 DIAGNOSIS — M25512 Pain in left shoulder: Secondary | ICD-10-CM | POA: Diagnosis not present

## 2015-02-26 DIAGNOSIS — G8929 Other chronic pain: Secondary | ICD-10-CM

## 2015-02-26 DIAGNOSIS — I639 Cerebral infarction, unspecified: Secondary | ICD-10-CM | POA: Diagnosis not present

## 2015-02-26 DIAGNOSIS — I69398 Other sequelae of cerebral infarction: Secondary | ICD-10-CM

## 2015-02-26 DIAGNOSIS — F0631 Mood disorder due to known physiological condition with depressive features: Secondary | ICD-10-CM | POA: Insufficient documentation

## 2015-02-26 DIAGNOSIS — M1990 Unspecified osteoarthritis, unspecified site: Secondary | ICD-10-CM | POA: Insufficient documentation

## 2015-02-26 DIAGNOSIS — IMO0002 Reserved for concepts with insufficient information to code with codable children: Secondary | ICD-10-CM

## 2015-02-26 DIAGNOSIS — F17200 Nicotine dependence, unspecified, uncomplicated: Secondary | ICD-10-CM

## 2015-02-26 DIAGNOSIS — I69354 Hemiplegia and hemiparesis following cerebral infarction affecting left non-dominant side: Secondary | ICD-10-CM | POA: Diagnosis not present

## 2015-02-26 DIAGNOSIS — E785 Hyperlipidemia, unspecified: Secondary | ICD-10-CM

## 2015-02-26 LAB — COMPREHENSIVE METABOLIC PANEL
ALBUMIN: 4.4 g/dL (ref 3.6–5.1)
ALK PHOS: 79 U/L (ref 40–115)
ALT: 21 U/L (ref 9–46)
AST: 16 U/L (ref 10–35)
BILIRUBIN TOTAL: 0.7 mg/dL (ref 0.2–1.2)
BUN: 10 mg/dL (ref 7–25)
CALCIUM: 10.5 mg/dL — AB (ref 8.6–10.3)
CO2: 24 mmol/L (ref 20–31)
Chloride: 102 mmol/L (ref 98–110)
Creat: 0.74 mg/dL (ref 0.70–1.33)
Glucose, Bld: 85 mg/dL (ref 65–99)
Potassium: 4.4 mmol/L (ref 3.5–5.3)
Sodium: 137 mmol/L (ref 135–146)
Total Protein: 7.9 g/dL (ref 6.1–8.1)

## 2015-02-26 LAB — LIPID PANEL
Cholesterol: 333 mg/dL — ABNORMAL HIGH (ref 125–200)
HDL: 35 mg/dL — AB (ref >=40)
LDL Cholesterol: 248 mg/dL — ABNORMAL HIGH (ref <130)
TRIGLYCERIDES: 248 mg/dL — AB (ref <150)
Total CHOL/HDL Ratio: 9.5 Ratio — ABNORMAL HIGH (ref <=5.0)
VLDL: 50 mg/dL — ABNORMAL HIGH (ref <30)

## 2015-02-26 MED ORDER — BUPROPION HCL ER (XL) 450 MG PO TB24: 450.0000 mg | ORAL_TABLET | Freq: Every day | ORAL | Status: DC

## 2015-02-26 MED ORDER — TRAMADOL HCL 50 MG PO TABS: 50.0000 mg | ORAL_TABLET | Freq: Two times a day (BID) | ORAL | Status: DC

## 2015-02-26 NOTE — Patient Instructions (Signed)
I will contact you with your lab results as soon as they are available.   If you have not heard from me in 2 weeks, please contact me.  The fastest way to get your results is to register for My Chart (see the instructions on the last page of this printout).   

## 2015-02-26 NOTE — Patient Instructions (Signed)
We will do a nerve block in the left shoulder next visit  If this is not helpful I would recommend peripheral nerve stimulation  Tramadol will be increased to twice a day

## 2015-02-26 NOTE — Progress Notes (Signed)
Patient ID: [name], male    DOB: 14-Nov-1955, 60 y.o.   MRN: [id number]  PCP: Travaris Kosh, PA-C  Subjective:   Chief Complaint  Patient presents with  . Follow-up    Depression    HPI Presents for evaluation of depression following CVA and fasting lipids.  Depression screening score is higher today. He thinks he needs a dose adjustment. The Wellbutrin is helping, just not enough.  Lies in bed and watches TV. Minimal exercise. Working with a ball in the LEFT hand. Not working on shoulder ROM due to pain. "I really think that if I didn't have so much pain, I'd be able to do the exercises." Requests pain medication. Continues to neglect the LEFT side, but less so. Feels like his balance is worsening. Also notes he's getting more forgetful.  Working to cut back on smoking and alcohol. Not smoking is harder.  Review of Systems  Constitutional: Negative for activity change, appetite change, fatigue and unexpected weight change.  HENT: Negative for congestion, dental problem, ear pain, hearing loss, mouth sores, postnasal drip, rhinorrhea, sneezing, sore throat, tinnitus and trouble swallowing.   Eyes: Negative for photophobia, pain, redness and visual disturbance.  Respiratory: Negative for cough, chest tightness and shortness of breath.   Cardiovascular: Negative for chest pain, palpitations and leg swelling.  Gastrointestinal: Negative for nausea, vomiting, abdominal pain, diarrhea, constipation and blood in stool.  Genitourinary: Negative for dysuria, urgency, frequency and hematuria.  Musculoskeletal: Negative for myalgias, arthralgias, gait problem and neck stiffness.  Skin: Negative for rash.  Neurological: Negative for dizziness, speech difficulty, light-headedness and headaches.       LEFT sided weakness due to CVA  Hematological: Negative for adenopathy.  Psychiatric/Behavioral: Positive for dysphoric mood. Negative for confusion and sleep disturbance. The  patient is not nervous/anxious.        Patient Active Problem List   Diagnosis Date Noted  . Depression due to stroke (HCC) 02/26/2015  . Cerebral infarction due to thrombosis of right anterior cerebral artery (HCC) 10/29/2014  . Hypersomnia with sleep apnea 10/29/2014  . Embolic stroke involving right middle cerebral artery (HCC) 10/29/2014  . Nicotine use disorder 10/29/2014  . Left anterior shoulder pain 10/23/2014  . Alterations of sensations following CVA (cerebrovascular accident) 09/10/2014  . Atrial fibrillation (HCC) 09/06/2014  . Left spastic hemiparesis (HCC) 08/07/2014  . Left-sided neglect 08/07/2014  . Right middle cerebral artery stroke (HCC) 08/01/2014  . Tobacco dependence   . Carotid artery occlusion with infarction (HCC)   . History of ETOH abuse   . Hyperlipidemia   . Essential hypertension, benign 10/11/2013     Prior to Admission medications   Medication Sig Start Date End Date Taking? Authorizing Provider  atorvastatin (LIPITOR) 80 MG tablet TAKE 1 TABLET BY MOUTH DAILY AT 6 PM 09/25/14  Yes Ethelda Chick, MD  buPROPion (WELLBUTRIN XL) 300 MG 24 hr tablet Take 1 tablet (300 mg total) by mouth daily. 01/22/15  Yes Kastiel Simonian, PA-C  gabapentin (NEURONTIN) 300 MG capsule Take 1 capsule (300 mg total) by mouth 3 (three) times daily. 12/19/14  Yes Erick Colace, MD  lisinopril (PRINIVIL,ZESTRIL) 5 MG tablet Take 1 tablet (5 mg total) by mouth daily. 10/17/14  Yes Olly Shiner, PA-C  rivaroxaban (XARELTO) 20 MG TABS tablet Take 1 tablet (20 mg total) by mouth daily with supper. 09/26/14  Yes Marinus Maw, MD  traMADol (ULTRAM) 50 MG tablet Take 1 tablet (50 mg total) by mouth at bedtime as  needed. 01/22/15  Yes Erick ColaceAndrew E Kirsteins, MD  colchicine 0.6 MG tablet Take 1 tablet (0.6 mg total) by mouth 2 (two) times daily as needed (gout). Patient not taking: Reported on 02/26/2015 01/11/15   Porfirio Oarhelle Joriel Streety, PA-C     No Known Allergies     Objective:    Physical Exam  Constitutional: He is oriented to person, place, and time. Vital signs are normal. He appears well-developed and well-nourished. He is active and cooperative. No distress.  BP 138/94 mmHg  Pulse 70  Temp(Src) 98.4 F (36.9 C) (Oral)  Resp 18  Ht 5' 6.5" (1.689 m)  Wt 174 lb (78.926 kg)  BMI 27.67 kg/m2  SpO2 97%  HENT:  Head: Normocephalic and atraumatic.  Right Ear: Hearing normal.  Left Ear: Hearing normal.  Eyes: Conjunctivae are normal. No scleral icterus.  Neck: Normal range of motion. Neck supple. No thyromegaly present.  Cardiovascular: Normal rate, regular rhythm and normal heart sounds.   Pulses:      Radial pulses are 2+ on the right side, and 2+ on the left side.  Pulmonary/Chest: Effort normal and breath sounds normal.  Musculoskeletal:       Left shoulder: He exhibits decreased range of motion, tenderness and pain. He exhibits no bony tenderness and no spasm.       Left elbow: He exhibits normal range of motion. No tenderness found.       Left wrist: Normal.       Left upper arm: He exhibits no tenderness.       Left forearm: He exhibits no tenderness.       Left hand: He exhibits swelling. Decreased strength noted.  Lymphadenopathy:       Head (right side): No tonsillar, no preauricular, no posterior auricular and no occipital adenopathy present.       Head (left side): No tonsillar, no preauricular, no posterior auricular and no occipital adenopathy present.    He has no cervical adenopathy.       Right: No supraclavicular adenopathy present.       Left: No supraclavicular adenopathy present.  Neurological: He is alert and oriented to person, place, and time. A sensory deficit is present.  Reduced strength on the LEFT, most prominent in the LUE. LEFT sided neglect.  Skin: Skin is warm, dry and intact. No rash noted. No cyanosis or erythema. Nails show no clubbing.  Psychiatric: He has a normal mood and affect. His speech is normal and behavior is  normal. Judgment and thought content normal.           Assessment & Plan:   1. Special screening for malignant neoplasms, colon - Ambulatory referral to Gastroenterology  2. Essential hypertension, benign Improved. I suspect that his pain is contributing, will wait to see if Dr. Wynn BankerKirsteins makes changes in that regimen. If not, will  Increase lisinopril to 10 mg. - Comprehensive metabolic panel  3. Hyperlipidemia Await lab results. Continue high-dose statin and efforts to reduce tobacco and alcohol use. - Comprehensive metabolic panel - Lipid panel  4. Tobacco dependence Smoking cessation again encouraged.  5. Depression due to stroke (HCC) Increase Wellbutrin to 450 mg daily. Increase physical activity. - buPROPion 450 MG TB24; Take 450 mg by mouth daily.  Dispense: 90 tablet; Refill: 3   Return in about 4 weeks (around 03/26/2015) for depression.   Fernande Brashelle S. Taressa Rauh, PA-C Physician Assistant-Certified [facility]

## 2015-02-27 ENCOUNTER — Other Ambulatory Visit: Payer: Self-pay

## 2015-02-27 NOTE — Telephone Encounter (Signed)
Pt brought back Tramadol script. Tramadol was called into [facility]. Tramadol script was voided and shredded, witnessed by [employer].

## 2015-02-27 NOTE — Telephone Encounter (Signed)
[facility] However, he has the rx so I informed the pt that I can not call it in until I get that script back. Pt stated he will bring rx back to office so that I can call it in. [facility]: [phone].

## 2015-02-28 NOTE — Progress Notes (Signed)
Diagnostic ultrasound left shoulder  Indication left shoulder pain  12 Hz transducer utilized.  Left bicipital groove short axis there is evidence of fluid around the biceps tendon.  Area is 0.257 m, on long axis view there is evidence of fluid on the inferior aspect of the tendon. Tendon is diminished in cross-sectional area 2 mm  Supraspinatus long axis view 5.8 mm, there is evidence of hypoechoic area as well as some calcification at the distal insertion site. Minor hypervascularity  Subacromial views of the left supraspinatus tendon show irregular cortical surface. Evidence of impingement with abduction. Pain during this maneuver.  Infraspinatus long axis view showed normal echogenicity. No evidence of fluid or defects in the tendon  Left infraspinatus short Axis II no evidence of fluid around the tendon no evidence of tears.  Left acromioclavicular joint shows surface irregularity of both the acromial as well as clavicular aspects.  Impression 1. Abnormal study 2. Evidence of bicipital tendon tear versus atresia with Teno synovitis 3. Supraspinatus articular surface insertional tear distal aspect. There is associated cortical irregularity.

## 2015-03-08 ENCOUNTER — Encounter: Payer: Self-pay | Admitting: Physician Assistant

## 2015-03-15 ENCOUNTER — Encounter: Payer: Self-pay | Admitting: Physical Medicine & Rehabilitation

## 2015-03-15 ENCOUNTER — Ambulatory Visit (HOSPITAL_BASED_OUTPATIENT_CLINIC_OR_DEPARTMENT_OTHER): Payer: BLUE CROSS/BLUE SHIELD | Admitting: Physical Medicine & Rehabilitation

## 2015-03-15 VITALS — BP 135/82 | HR 76 | Resp 14

## 2015-03-15 DIAGNOSIS — I69898 Other sequelae of other cerebrovascular disease: Secondary | ICD-10-CM | POA: Diagnosis not present

## 2015-03-15 DIAGNOSIS — G8114 Spastic hemiplegia affecting left nondominant side: Secondary | ICD-10-CM

## 2015-03-15 DIAGNOSIS — R208 Other disturbances of skin sensation: Secondary | ICD-10-CM

## 2015-03-15 DIAGNOSIS — I69354 Hemiplegia and hemiparesis following cerebral infarction affecting left non-dominant side: Secondary | ICD-10-CM | POA: Diagnosis not present

## 2015-03-15 DIAGNOSIS — R209 Unspecified disturbances of skin sensation: Secondary | ICD-10-CM

## 2015-03-15 DIAGNOSIS — I69398 Other sequelae of cerebral infarction: Secondary | ICD-10-CM

## 2015-03-15 DIAGNOSIS — R414 Neurologic neglect syndrome: Secondary | ICD-10-CM | POA: Diagnosis not present

## 2015-03-15 NOTE — Progress Notes (Signed)
Subjective:    Patient ID: [name], male    DOB: Oct 31, 1955, 60 y.o.   MRN: [id number]  HPI   Patient tried driving, felt like he was all over the road. Also had increased numbness of the left side of the face. He decided to go back home and has not driven since that time. We discussed that he has not been released to driving at this time.  Patient has an appointment with Social Security disability for further evaluation.  Patient still requiring some assistance for dressing such as donning shirt and pants He is able to bathe independently, showers without a shower chair.  Patient does some light meal preparation, he ambulates without a cane or other assisted device.  Pain Inventory Average Pain 9 Pain Right Now 9 My pain is NA  In the last 24 hours, has pain interfered with the following? General activity 2 Relation with others 1 Enjoyment of life 1 What TIME of day is your pain at its worst? daytime Sleep (in general) Poor  Pain is worse with: NA Pain improves with: NA Relief from Meds: 4  Mobility how many minutes can you walk? 20 ability to climb steps?  yes do you drive?  no  Function Do you have any goals in this area?  no  Neuro/Psych No problems in this area  Prior Studies Any changes since last visit?  no  Physicians involved in your care Any changes since last visit?  no   Family History  Problem Relation Age of Onset  . Cancer Father     leukemia  . Heart attack Brother   . Hypertension Brother    Social History   Social History  . Marital Status: Married    Spouse Name: Marylu Lund  . Number of Children: 3  . 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 Smoking status: Current Every Day Smoker -- 0.50 packs/day for 40 years    Types: Cigarettes  . Smokeless tobacco: Never Used     Comment: cut back after stroke, but increased again due to depression  .  Alcohol Use: 0.0 - 1.2 oz/week    0-2 Cans of beer per week     Comment: <12/week  . Drug Use: No  . Sexual Activity:    Partners: Female   Other Topics Concern  . None   Social History Narrative   Lives at home with wife, Marylu Lund   Caffeine use - tea 4-5 glasses a day   Past Surgical History  Procedure Laterality Date  . Loop recorder implant  07/27/14    LOOP REVEAL LINQ VOZ36 - [id number]  . Tee without cardioversion N/A 07/27/2014    Procedure: TRANSESOPHAGEAL ECHOCARDIOGRAM (TEE);  Surgeon: Chrystie Nose, MD;  Location: [facility] ENDOSCOPY;  Service: Cardiovascular;  Laterality: N/A;  . Ep implantable device N/A 07/27/2014    Procedure: Loop Recorder Insertion;  Surgeon: Marinus Maw, MD;  Location: [facility] INVASIVE CV LAB;  Service: Cardiovascular;  Laterality: N/A;  . Ep implantable device N/A 12/20/2014    Procedure: Loop Recorder Removal;  Surgeon: Marinus Maw, MD;  Location: [facility] INVASIVE CV LAB;  Service: Cardiovascular;  Laterality: N/A;   Past Medical History  Diagnosis Date  . Arthritis   . Hypertension   . Hyperlipidemia   . Cor triatriatum     a. identified on TEE at time  of stroke  . Paroxysmal atrial fibrillation (HCC)     a. identified on LINQ 08/2014  . Stroke (HCC) 07/25/14    a. s/p MDT ILR implant   . Alcohol use with alcohol-induced mood disorder (HCC)    BP 135/82 mmHg  Pulse 76  Resp 14  SpO2 95%  Opioid Risk Score:   Fall Risk Score:  `1  Depression screen PHQ 2/9  Depression screen [facility] 2/9 02/26/2015 02/26/2015 01/22/2015 12/18/2014 10/30/2014 10/29/2014 09/10/2014  Decreased Interest 1 1 0 Down, Depressed, Hopeless 1 1 0 PHQ - 2 Score 2 2 0 Altered sleeping - 3 0 Tired, decreased energy - Change in appetite - 3 - - 0 1 0  Feeling bad or failure about yourself  - 0 0 1  Trouble concentrating - 0 0 0 1 1 0  Moving slowly or fidgety/restless - 1 0 0 0 0 1  Suicidal thoughts - 0 0 0 0 0 0  PHQ-9 Score -  Difficult doing work/chores - Somewhat difficult - - - Not difficult at all -      Review of Systems  All other systems reviewed and are negative.      Objective:   Physical Exam  Constitutional: He is oriented to person, place, and time. He appears well-developed and well-nourished.  HENT:  Head: Normocephalic and atraumatic.  Eyes: Conjunctivae are normal. Pupils are equal, round, and reactive to light.  Neurological: He is alert and oriented to person, place, and time.  Motor strength is 3 minus at the left deltoid, 4 minus at the bicep tricep 3 minus at the left grip 3 minus at the left finger extensors 5/5 in the right deltoid, bicep, triceps, grip, hip flexor and knee extensor and ankle dorsal flexor plantar flexion Left lower extremities 4+ in a flexion-extension ankle dorsi flexor.   Psychiatric: Thought content normal. His affect is blunt. His speech is delayed. He is slowed and withdrawn. He is inattentive.  Nursing note and vitals reviewed.    Visual fields are intact confrontation testing however patient has visual neglect on the left side. Also absent sensation to light touch in the left upper extremity     Assessment & Plan:  1. Right MCA distribution infarct with left hemiparesis, left hemisensory deficits and left neglect. Patient is greater than 6 months post infarct, therefore I do not expect much more improvement in terms of his hemisensory deficits as well as with his left neglect.  2. Left hemiplegic shoulder pain, likely multifactorial but Acromioclavicular joint appears to be most symptomatic at the current time. Also evidence of Acromioclavicular joint arthropathy noted on ultrasound  Patient's mother in the room, went over his medications his medical follow-ups as well as which Dr. addresses each of his various problems.  He also requests referral to stroke support group. I gave him social work contact information at [facility]

## 2015-03-15 NOTE — Patient Instructions (Addendum)
[name] [phone] Call about stroke support group  At [facility] fourth floor  L with your primary doctor as well as her cardiologist in regards to medications  NO DRIVING !!

## 2015-03-19 ENCOUNTER — Observation Stay (HOSPITAL_COMMUNITY): Payer: BLUE CROSS/BLUE SHIELD

## 2015-03-19 ENCOUNTER — Encounter (HOSPITAL_COMMUNITY): Payer: Self-pay | Admitting: Cardiology

## 2015-03-19 ENCOUNTER — Observation Stay (HOSPITAL_COMMUNITY)
Admission: EM | Admit: 2015-03-19 | Discharge: 2015-03-21 | Disposition: A | Discharge status: Home / Self Care | Payer: BLUE CROSS/BLUE SHIELD | Attending: Family Medicine | Admitting: Family Medicine

## 2015-03-19 DIAGNOSIS — F17200 Nicotine dependence, unspecified, uncomplicated: Secondary | ICD-10-CM | POA: Diagnosis present

## 2015-03-19 DIAGNOSIS — F1011 Alcohol abuse, in remission: Secondary | ICD-10-CM | POA: Diagnosis present

## 2015-03-19 DIAGNOSIS — F329 Major depressive disorder, single episode, unspecified: Secondary | ICD-10-CM | POA: Diagnosis not present

## 2015-03-19 DIAGNOSIS — N179 Acute kidney failure, unspecified: Secondary | ICD-10-CM | POA: Diagnosis not present

## 2015-03-19 DIAGNOSIS — I10 Essential (primary) hypertension: Secondary | ICD-10-CM | POA: Diagnosis not present

## 2015-03-19 DIAGNOSIS — E785 Hyperlipidemia, unspecified: Secondary | ICD-10-CM | POA: Diagnosis not present

## 2015-03-19 DIAGNOSIS — I480 Paroxysmal atrial fibrillation: Secondary | ICD-10-CM | POA: Diagnosis present

## 2015-03-19 DIAGNOSIS — F1010 Alcohol abuse, uncomplicated: Secondary | ICD-10-CM | POA: Diagnosis not present

## 2015-03-19 DIAGNOSIS — K5900 Constipation, unspecified: Secondary | ICD-10-CM | POA: Diagnosis not present

## 2015-03-19 DIAGNOSIS — I69354 Hemiplegia and hemiparesis following cerebral infarction affecting left non-dominant side: Secondary | ICD-10-CM | POA: Diagnosis not present

## 2015-03-19 DIAGNOSIS — D72829 Elevated white blood cell count, unspecified: Secondary | ICD-10-CM | POA: Diagnosis present

## 2015-03-19 DIAGNOSIS — K920 Hematemesis: Principal | ICD-10-CM | POA: Diagnosis present

## 2015-03-19 DIAGNOSIS — G4733 Obstructive sleep apnea (adult) (pediatric): Secondary | ICD-10-CM | POA: Insufficient documentation

## 2015-03-19 DIAGNOSIS — F17210 Nicotine dependence, cigarettes, uncomplicated: Secondary | ICD-10-CM | POA: Diagnosis not present

## 2015-03-19 DIAGNOSIS — Z7901 Long term (current) use of anticoagulants: Secondary | ICD-10-CM | POA: Diagnosis not present

## 2015-03-19 DIAGNOSIS — Z86010 Personal history of colonic polyps: Secondary | ICD-10-CM | POA: Diagnosis not present

## 2015-03-19 HISTORY — DX: Gout, unspecified: M10.9

## 2015-03-19 HISTORY — DX: Polyp of colon: K63.5

## 2015-03-19 LAB — CBC
HCT: 48.3 % (ref 39.0–52.0)
Hemoglobin: 16.7 g/dL (ref 13.0–17.0)
MCH: 30.9 pg (ref 26.0–34.0)
MCHC: 34.6 g/dL (ref 30.0–36.0)
MCV: 89.4 fL (ref 78.0–100.0)
PLATELETS: 360 10*3/uL (ref 150–400)
RBC: 5.4 MIL/uL (ref 4.22–5.81)
RDW: 13.5 % (ref 11.5–15.5)
WBC: 13.8 10*3/uL — ABNORMAL HIGH (ref 4.0–10.5)

## 2015-03-19 LAB — COMPREHENSIVE METABOLIC PANEL
ALBUMIN: 4.2 g/dL (ref 3.5–5.0)
ALK PHOS: 76 U/L (ref 38–126)
ALT: 29 U/L (ref 17–63)
AST: 27 U/L (ref 15–41)
Anion gap: 14 (ref 5–15)
BUN: 14 mg/dL (ref 6–20)
CALCIUM: 10.2 mg/dL (ref 8.9–10.3)
CO2: 26 mmol/L (ref 22–32)
CREATININE: 1.27 mg/dL — AB (ref 0.61–1.24)
Chloride: 99 mmol/L — ABNORMAL LOW (ref 101–111)
Glucose, Bld: 117 mg/dL — ABNORMAL HIGH (ref 65–99)
Potassium: 4.4 mmol/L (ref 3.5–5.1)
SODIUM: 139 mmol/L (ref 135–145)
Total Bilirubin: 0.7 mg/dL (ref 0.3–1.2)
Total Protein: 7.9 g/dL (ref 6.5–8.1)

## 2015-03-19 LAB — LIPASE, BLOOD: Lipase: 28 U/L (ref 11–51)

## 2015-03-19 MED ORDER — GABAPENTIN 300 MG PO CAPS
300.0000 mg | ORAL_CAPSULE | Freq: Three times a day (TID) | ORAL | Status: DC
  Administered 2015-03-20 – 2015-03-21 (×5): 300 mg via ORAL
  Filled 2015-03-19 (×6): qty 1

## 2015-03-19 MED ORDER — BUPROPION HCL ER (XL) 300 MG PO TB24
450.0000 mg | ORAL_TABLET | Freq: Every day | ORAL | Status: DC
  Administered 2015-03-20 – 2015-03-21 (×2): 450 mg via ORAL
  Filled 2015-03-19 (×2): qty 1

## 2015-03-19 MED ORDER — SODIUM CHLORIDE 0.9 % IV SOLN: Freq: Once | INTRAVENOUS | Status: DC

## 2015-03-19 MED ORDER — PHENOL 1.4 % MT LIQD
1.0000 | OROMUCOSAL | Status: DC | PRN
  Administered 2015-03-20: 1 via OROMUCOSAL
  Filled 2015-03-19: qty 177

## 2015-03-19 MED ORDER — BUPROPION HCL ER (XL) 450 MG PO TB24: 1.0000 | ORAL_TABLET | Freq: Every day | ORAL | Status: DC

## 2015-03-19 MED ORDER — SODIUM CHLORIDE 0.9 % IV SOLN
INTRAVENOUS | Status: DC
  Administered 2015-03-20 – 2015-03-21 (×3): via INTRAVENOUS

## 2015-03-19 MED ORDER — SODIUM CHLORIDE 0.9 % IV SOLN
8.0000 mg/h | INTRAVENOUS | Status: DC
  Administered 2015-03-19 – 2015-03-21 (×3): 8 mg/h via INTRAVENOUS
  Filled 2015-03-19 (×8): qty 80

## 2015-03-19 MED ORDER — PANTOPRAZOLE SODIUM 40 MG IV SOLR: 40.0000 mg | Freq: Two times a day (BID) | INTRAVENOUS | Status: DC

## 2015-03-19 MED ORDER — ATORVASTATIN CALCIUM 80 MG PO TABS
80.0000 mg | ORAL_TABLET | Freq: Every day | ORAL | Status: DC
  Administered 2015-03-20 – 2015-03-21 (×3): 80 mg via ORAL
  Filled 2015-03-19 (×3): qty 1

## 2015-03-19 MED ORDER — TRAMADOL HCL 50 MG PO TABS
50.0000 mg | ORAL_TABLET | Freq: Two times a day (BID) | ORAL | Status: DC | PRN
  Administered 2015-03-21: 50 mg via ORAL
  Filled 2015-03-19: qty 1

## 2015-03-19 MED ORDER — ONDANSETRON HCL 4 MG/2ML IJ SOLN
4.0000 mg | Freq: Once | INTRAMUSCULAR | Status: AC
Start: 2015-03-19 — End: 2015-03-19
  Administered 2015-03-19: 4 mg via INTRAVENOUS
  Filled 2015-03-19: qty 2

## 2015-03-19 MED ORDER — SODIUM CHLORIDE 0.9 % IV BOLUS (SEPSIS)
1000.0000 mL | Freq: Once | INTRAVENOUS | Status: AC
  Administered 2015-03-19: 1000 mL via INTRAVENOUS

## 2015-03-19 MED ORDER — SODIUM CHLORIDE 0.9% FLUSH
3.0000 mL | Freq: Two times a day (BID) | INTRAVENOUS | Status: DC
  Administered 2015-03-20 (×2): 3 mL via INTRAVENOUS

## 2015-03-19 MED ORDER — SODIUM CHLORIDE 0.9 % IV SOLN
80.0000 mg | Freq: Once | INTRAVENOUS | Status: AC
  Administered 2015-03-19: 80 mg via INTRAVENOUS
  Filled 2015-03-19: qty 80

## 2015-03-19 MED ORDER — MORPHINE SULFATE (PF) 2 MG/ML IV SOLN
2.0000 mg | INTRAVENOUS | Status: DC | PRN
  Administered 2015-03-21: 2 mg via INTRAVENOUS
  Filled 2015-03-19: qty 1

## 2015-03-19 NOTE — ED Notes (Signed)
Dr. Tully at bedside.

## 2015-03-19 NOTE — ED Notes (Signed)
pts wife came to EMT stating that pt was vomiting blood. EMT checked on pt and then asked this RN to check on pt. Pt in bathroom vomiting red emesis. Pt is alert. Awaiting clean room for pt to be placed in.

## 2015-03-19 NOTE — ED Notes (Signed)
Reports vomiting that started this morning. States he noticed blood in it the last time.

## 2015-03-19 NOTE — ED Provider Notes (Addendum)
CSN: [id number]     Arrival date & time 03/19/15  1722 History   First MD Initiated Contact with Patient 03/19/15 1938     Chief Complaint  Patient presents with  . Emesis      HPI  [name] presents with a complaint of vomiting all day, and now vomiting blood.  He states he waking this morning feeling fairly well. By 11 AM he was nauseated and had several episodes of emesis at home. On the last episode at home he had some blood. He had 3 episodes while in route, or checking into the emergency room. These were frank blood. He does not have history of GI bleeding. He does have history of stroke. Found to be in atrial fibrillation on Loop monitor. Therefore is anticoagulated with Xarelto.  He does not feel abdominal pain. Is not had diarrhea. No fevers. No lightheadedness or dizziness. No chest pain. States his throat is sore from all the vomiting".  History of alcohol use. Denies any alcohol for more than one year. No history of alcohol-related liver disorders or known varices.  Past Medical History  Diagnosis Date  . Arthritis   . Hypertension   . Hyperlipidemia   . Cor triatriatum     a. identified on TEE at time of stroke  . Paroxysmal atrial fibrillation (HCC)     a. identified on LINQ 08/2014  . Stroke (HCC) 07/25/14    a. s/p MDT ILR implant   . Alcohol use with alcohol-induced mood disorder [facility])    Past Surgical History  Procedure Laterality Date  . Loop recorder implant  07/27/14    LOOP REVEAL LINQ DGL87 - [id number]  . Tee without cardioversion N/A 07/27/2014    Procedure: TRANSESOPHAGEAL ECHOCARDIOGRAM (TEE);  Surgeon: Chrystie Nose, MD;  Location: [facility] ENDOSCOPY;  Service: Cardiovascular;  Laterality: N/A;  . Ep implantable device N/A 07/27/2014    Procedure: Loop Recorder Insertion;  Surgeon: Marinus Maw, MD;  Location: [facility] INVASIVE CV LAB;  Service: Cardiovascular;  Laterality: N/A;  . Ep implantable device N/A 12/20/2014    Procedure: Loop Recorder Removal;   Surgeon: Marinus Maw, MD;  Location: [facility] INVASIVE CV LAB;  Service: Cardiovascular;  Laterality: N/A;   Family History  Problem Relation Age of Onset  . Cancer Father     leukemia  . Heart attack Brother   . Hypertension Brother    Social History  Substance Use Topics  . Smoking status: Current Every Day Smoker -- 0.50 packs/day for 40 years    Types: Cigarettes  . Smokeless tobacco: Never Used     Comment: cut back after stroke, but increased again due to depression  . Alcohol Use: 0.0 - 1.2 oz/week    0-2 Cans of beer per week     Comment: <12/week    Review of Systems  Constitutional: Negative for fever, chills, diaphoresis, appetite change and fatigue.  HENT: Negative for mouth sores, sore throat and trouble swallowing.   Eyes: Negative for visual disturbance.  Respiratory: Negative for cough, chest tightness, shortness of breath and wheezing.   Cardiovascular: Negative for chest pain.  Gastrointestinal: Positive for nausea and vomiting. Negative for abdominal pain, diarrhea and abdominal distention.       Hematemesis  Endocrine: Negative for polydipsia, polyphagia and polyuria.  Genitourinary: Negative for dysuria, frequency and hematuria.  Musculoskeletal: Negative for gait problem.  Skin: Negative for color change, pallor and rash.  Neurological: Negative for dizziness, syncope, light-headedness and headaches.  Hematological: Does not bruise/bleed easily.  Psychiatric/Behavioral: Negative for behavioral problems and confusion.      Allergies  Review of patient's allergies indicates no known allergies.  Home Medications   Prior to Admission medications   Medication Sig Start Date End Date Taking? Authorizing Provider  atorvastatin (LIPITOR) 80 MG tablet TAKE 1 TABLET BY MOUTH DAILY AT 6 PM 09/25/14  Yes Ethelda Chick, MD  BuPROPion HCl ER, XL, 450 MG TB24 Take 1 tablet by mouth daily.    Yes Historical Provider, MD  colchicine 0.6 MG tablet Take 1 tablet (0.6 mg  total) by mouth 2 (two) times daily as needed (gout). 01/11/15  Yes Chelle Jeffery, PA-C  gabapentin (NEURONTIN) 300 MG capsule Take 1 capsule (300 mg total) by mouth 3 (three) times daily. 12/19/14  Yes Erick Colace, MD  lisinopril (PRINIVIL,ZESTRIL) 5 MG tablet Take 1 tablet (5 mg total) by mouth daily. 10/17/14  Yes Chelle Jeffery, PA-C  rivaroxaban (XARELTO) 20 MG TABS tablet Take 1 tablet (20 mg total) by mouth daily with supper. 09/26/14  Yes Marinus Maw, MD  traMADol (ULTRAM) 50 MG tablet Take 1 tablet (50 mg total) by mouth 2 (two) times daily. 02/26/15  Yes Erick Colace, MD   BP 139/75 mmHg  Pulse 83  Temp(Src) 98.3 F (36.8 C) (Oral)  Resp 18  SpO2 94% Physical Exam  Constitutional: He is oriented to person, place, and time. He appears well-developed and well-nourished. No distress.  HENT:  Head: Normocephalic.  Eyes: Conjunctivae are normal. Pupils are equal, round, and reactive to light. No scleral icterus.  Neck: Normal range of motion. Neck supple. No thyromegaly present.  Cardiovascular: Normal rate and regular rhythm.  Exam reveals no gallop and no friction rub.   No murmur heard. Pulmonary/Chest: Effort normal and breath sounds normal. No respiratory distress. He has no wheezes. He has no rales.  Abdominal: Soft. Bowel sounds are normal. He exhibits no distension. There is no tenderness. There is no rebound.  Soft benign abdomen.  Musculoskeletal: Normal range of motion.  Neurological: He is alert and oriented to person, place, and time.  Skin: Skin is warm and dry. No rash noted.  Psychiatric: He has a normal mood and affect. His behavior is normal.    ED Course  Procedures (including critical care time) Labs Review Labs Reviewed  COMPREHENSIVE METABOLIC PANEL - Abnormal; Notable for the following:    Chloride 99 (*)    Glucose, Bld 117 (*)    Creatinine, Ser 1.27 (*)    All other components within normal limits  CBC - Abnormal; Notable for the  following:    WBC 13.8 (*)    All other components within normal limits  LIPASE, BLOOD  URINALYSIS, ROUTINE W REFLEX MICROSCOPIC (NOT AT [facility])    Imaging Review No results found. I have personally reviewed and evaluated these images and lab results as part of my medical decision-making.   EKG Interpretation None      MDM   Final diagnoses:  Hematemesis, presence of nausea not specified    Patient remained stable here. First hemoglobin is normal.  Leukocytosis but normal hemoglobin. Normal platelet count. Creatinine slightly high 1.27. Will discuss disposition with hospitalist.    [name] Porter, MD 03/19/15 [id number]  [name] Porter, MD 03/19/15 2145

## 2015-03-19 NOTE — H&P (Addendum)
[facility] History and Physical  Rawlin Reaume ZOX:[id number] DOB: 01-Oct-1955 DOA: 03/19/2015  Referring physician: ED PCP: JEFFERY,CHELLE, PA-C   Chief Complaint: Vomiting blood  HPI:  Mr. Tavano is a 60 year old male with a past medical history significant for HTN, HLD, CVA with residual left-sided weakness, paroxysmal atrial fibrillation on Xarelto; who presents with complaints of vomiting blood. Patient notes symptoms started acutely this morning around 9:30 AM following taking his morning medications. Vomitus was initially only stomach contents. Over the course of the day he reported 6 episodes of vomiting. He report only a mild amount of blood with the third episode, and subsequently fell through the sixthepisodes were significantly bright red with clots blood. Patient notes trying to take a nap earlier in the day to see his symptoms improved. He reported trying to eat some chicken noodle soup vomiting this up as well. Patient denies any recent sick contacts to his knowledge, fever, chills, chest pain. He reports being recently taken off of several medications for his depression and questions if this could be the cause for his symptoms. He reports being on these medications for at least 7 days now. He denies any recent alcohol use and states last drink he had was back in December 2016. He continues to smoke about half pack cigarettes per day on average.   Upon arrival to the emergency department patient was seen have normal vitals and hemoglobin was 16.7, WBC 13.8, creatinine 1.27, BUN 14. Chest x-ray showed no acute abnormality.    Review of Systems  Constitutional: Negative for chills, weight loss and malaise/fatigue.  HENT: Positive for sore throat. Negative for hearing loss and tinnitus.   Eyes: Negative for photophobia and pain.  Respiratory: Negative for shortness of breath.   Cardiovascular: Negative for chest pain and palpitations.  Gastrointestinal: Positive for nausea and  vomiting. Negative for abdominal pain, diarrhea and blood in stool.       Hematemesis  Genitourinary: Negative for urgency and frequency.  Musculoskeletal: Negative for myalgias and falls.  Skin: Negative for itching and rash.  Neurological: Negative for focal weakness and seizures.  Endo/Heme/Allergies: Negative for environmental allergies. Bruises/bleeds easily.  Psychiatric/Behavioral: Negative for suicidal ideas and substance abuse.      Past Medical History  Diagnosis Date  . Arthritis   . Hypertension   . Hyperlipidemia   . Cor triatriatum     a. identified on TEE at time of stroke  . Paroxysmal atrial fibrillation (HCC)     a. identified on LINQ 08/2014  . Stroke (HCC) 07/25/14    a. s/p MDT ILR implant   . Alcohol use with alcohol-induced mood disorder [facility])      Past Surgical History  Procedure Laterality Date  . Loop recorder implant  07/27/14    LOOP REVEAL LINQ [id number] - [id number]  . Tee without cardioversion N/A 07/27/2014    Procedure: TRANSESOPHAGEAL ECHOCARDIOGRAM (TEE);  Surgeon: Chrystie Nose, MD;  Location: [facility] ENDOSCOPY;  Service: Cardiovascular;  Laterality: N/A;  . Ep implantable device N/A 07/27/2014    Procedure: Loop Recorder Insertion;  Surgeon: Marinus Maw, MD;  Location: [facility] INVASIVE CV LAB;  Service: Cardiovascular;  Laterality: N/A;  . Ep implantable device N/A 12/20/2014    Procedure: Loop Recorder Removal;  Surgeon: Marinus Maw, MD;  Location: [facility] INVASIVE CV LAB;  Service: Cardiovascular;  Laterality: N/A;      Social History:  reports that he has been smoking Cigarettes.  He has a 20 pack-year smoking history. He  has never used smokeless tobacco. He reports that he drinks alcohol. He reports that he does not use illicit drugs. Where does patient live--home and with whom if at home?  family Can patient participate in ADLs? Yes  No Known Allergies  Family History  Problem Relation Age of Onset  . Cancer Father     leukemia  . Heart attack  Brother   . Hypertension Brother         Prior to Admission medications   Medication Sig Start Date End Date Taking? Authorizing Provider  atorvastatin (LIPITOR) 80 MG tablet TAKE 1 TABLET BY MOUTH DAILY AT 6 PM 09/25/14  Yes Ethelda Chick, MD  BuPROPion HCl ER, XL, 450 MG TB24 Take 1 tablet by mouth daily.    Yes Historical Provider, MD  colchicine 0.6 MG tablet Take 1 tablet (0.6 mg total) by mouth 2 (two) times daily as needed (gout). 01/11/15  Yes Chelle Jeffery, PA-C  gabapentin (NEURONTIN) 300 MG capsule Take 1 capsule (300 mg total) by mouth 3 (three) times daily. 12/19/14  Yes Erick Colace, MD  lisinopril (PRINIVIL,ZESTRIL) 5 MG tablet Take 1 tablet (5 mg total) by mouth daily. 10/17/14  Yes Chelle Jeffery, PA-C  rivaroxaban (XARELTO) 20 MG TABS tablet Take 1 tablet (20 mg total) by mouth daily with supper. 09/26/14  Yes Marinus Maw, MD  traMADol (ULTRAM) 50 MG tablet Take 1 tablet (50 mg total) by mouth 2 (two) times daily. 02/26/15  Yes Erick Colace, MD     Physical Exam: Filed Vitals:   03/19/15 1804 03/19/15 2035  BP: 127/115 139/75  Pulse: 101 83  Temp: 98.3 F (36.8 C)   TempSrc: Oral   Resp: 20 18  SpO2: 96% 94%     Constitutional: Vital signs reviewed. Patient is a well-developed and well-nourished in no acute distress and cooperative with exam. Alert and oriented x3.  Head: Normocephalic and atraumatic  Ear: TM normal bilaterally  Mouth: no erythema or exudates, MMM  Eyes: PERRL, EOMI, conjunctivae normal, No scleral icterus.  Neck: Supple, Trachea midline normal ROM, No JVD, mass, thyromegaly, or carotid bruit present.  Cardiovascular: RRR, S1 normal, S2 normal, no MRG, pulses symmetric and intact bilaterally  Pulmonary/Chest: CTAB, no wheezes, rales, or rhonchi  Abdominal: Soft. Non-tender, non-distended, bowel sounds are normal, no masses, organomegaly, or guarding present.  GU: no CVA tenderness Musculoskeletal: No joint deformities, erythema,  or stiffness, ROM full and no nontender Ext: no edema and no cyanosis, pulses palpable bilaterally (DP and PT)  Hematology: no cervical, inginal, or axillary adenopathy.  Neurological: A&O x3, Strenght is normal and symmetric bilaterally, cranial nerve II-XII are grossly intact, no focal motor deficit, sensory intact to light touch bilaterally.  Skin: Warm, dry and intact. No subcutaneous emphysema present on palpation  Psychiatric: Normal mood and affect. speech and behavior is normal. Judgment and thought content normal. Cognition and memory are normal.      Data Review   Micro Results No results found for this or any previous visit (from the past 240 hour(s)).  Radiology Reports No results found.   CBC  Recent Labs Lab 03/19/15 1847  WBC 13.8*  HGB 16.7  HCT 48.3  PLT 360  MCV 89.4  MCH 30.9  MCHC 34.6  RDW 13.5    Chemistries   Recent Labs Lab 03/19/15 1847  NA 139  K 4.4  CL 99*  CO2 26  GLUCOSE 117*  BUN 14  CREATININE 1.27*  CALCIUM 10.2  AST 27  ALT 29  ALKPHOS 76  BILITOT 0.7   ------------------------------------------------------------------------------------------------------------------ CrCl cannot be calculated (Unknown ideal weight.). ------------------------------------------------------------------------------------------------------------------ No results for input(s): HGBA1C in the last 72 hours. ------------------------------------------------------------------------------------------------------------------ No results for input(s): CHOL, HDL, LDLCALC, TRIG, CHOLHDL, LDLDIRECT in the last 72 hours. ------------------------------------------------------------------------------------------------------------------ No results for input(s): TSH, T4TOTAL, T3FREE, THYROIDAB in the last 72 hours.  Invalid input(s): FREET3 ------------------------------------------------------------------------------------------------------------------ No  results for input(s): VITAMINB12, FOLATE, FERRITIN, TIBC, IRON, RETICCTPCT in the last 72 hours.  Coagulation profile No results for input(s): INR, PROTIME in the last 168 hours.  No results for input(s): DDIMER in the last 72 hours.  Cardiac Enzymes No results for input(s): CKMB, TROPONINI, MYOGLOBIN in the last 168 hours.  Invalid input(s): CK ------------------------------------------------------------------------------------------------------------------ Invalid input(s): POCBNP   CBG: No results for input(s): GLUCAP in the last 168 hours.     EKG: Independently reviewed.  sinus rhythm   Assessment/Plan    Hematemesis: Acute. Patient with 6 episodes of vomiting with the last 3 of those being of bright red blood. Patient has a history of alcohol use. Suspect that this could be a Mallory-Weiss tear. Hemoglobin and vitals appears to be stable at this time. Question possibility of a viral gastroenteritis versus recent discontinuation of antidepressant medications - Admit to a telemetry bed - Nothing by mouth  - Check PA and lateral chest x-ray now - Type and screen for blood - H&H every 6 hours 2 - Zofran and Compazine as needed for nausea/vomiting - Consult GI in a.m.   Leukocytosis: WBC count elevated at 13.9. Based on patient history this could be viral in nature. - Follow-up CBC in a.m.  Paroxysmal atrial fibrillation on Xarelto chadsvasc score of 3. Currently patient in follow sinus rhythm - held Xarelto for now  -Follow-up telemetry   Acute kidney injury: Baseline creatinine appears to be within normal limits last 0.74. Upon admission into the emergency department creatinine elevated at 1.27. Suspect this could be prerenal in nature secondary to recent nausea or vomiting. - IV fluids of normal saline and 100 mL per hour overnight - Recheck BMP in a.m.   Tobacco abuse patient reports smoking half pack cigarettes per day on average. - Provided with a nicotine  patch  History of alcohol use: Patient denies any recent use of tobacco since December of 2016.  Hyperlipidemia: Last lipid panel was obtained on 02/26/2015 cholesterol was 333, LDL 48, HDL 35 , triglycerides 248. - Continue atorvastatin  Code Status:   full Family Communication: bedside Disposition Plan: admit   Total time spent 55 minutes.Greater than 50% of this time was spent in counseling, explanation of diagnosis, planning of further management, and coordination of care  ARISTON GRANDISON [facility] Pager [phone]  If 7PM-7AM, please contact night-coverage [email or web address] Password [facility] 03/19/2015, 9:48 PM

## 2015-03-20 ENCOUNTER — Encounter (HOSPITAL_COMMUNITY): Payer: Self-pay | Admitting: Physician Assistant

## 2015-03-20 DIAGNOSIS — K920 Hematemesis: Secondary | ICD-10-CM | POA: Diagnosis not present

## 2015-03-20 DIAGNOSIS — N179 Acute kidney failure, unspecified: Secondary | ICD-10-CM

## 2015-03-20 DIAGNOSIS — I480 Paroxysmal atrial fibrillation: Secondary | ICD-10-CM | POA: Diagnosis not present

## 2015-03-20 DIAGNOSIS — R110 Nausea: Secondary | ICD-10-CM | POA: Diagnosis not present

## 2015-03-20 DIAGNOSIS — D72829 Elevated white blood cell count, unspecified: Secondary | ICD-10-CM | POA: Diagnosis present

## 2015-03-20 HISTORY — DX: Acute kidney failure, unspecified: N17.9

## 2015-03-20 LAB — BASIC METABOLIC PANEL
ANION GAP: 8 (ref 5–15)
BUN: 11 mg/dL (ref 6–20)
CHLORIDE: 103 mmol/L (ref 101–111)
CO2: 26 mmol/L (ref 22–32)
Calcium: 9.3 mg/dL (ref 8.9–10.3)
Creatinine, Ser: 1.01 mg/dL (ref 0.61–1.24)
GFR calc non Af Amer: >60 mL/min (ref >60)
Glucose, Bld: 91 mg/dL (ref 65–99)
Potassium: 4.2 mmol/L (ref 3.5–5.1)
Sodium: 137 mmol/L (ref 135–145)

## 2015-03-20 LAB — ABO/RH: ABO/RH(D): A NEG

## 2015-03-20 LAB — URINALYSIS, ROUTINE W REFLEX MICROSCOPIC
Bilirubin Urine: NEGATIVE
Glucose, UA: NEGATIVE mg/dL
HGB URINE DIPSTICK: NEGATIVE
Ketones, ur: NEGATIVE mg/dL
LEUKOCYTES UA: NEGATIVE
NITRITE: NEGATIVE
PROTEIN: NEGATIVE mg/dL
SPECIFIC GRAVITY, URINE: 1.011 (ref 1.005–1.030)
pH: 7 (ref 5.0–8.0)

## 2015-03-20 LAB — PREPARE RBC (CROSSMATCH)

## 2015-03-20 LAB — HEMOGLOBIN AND HEMATOCRIT, BLOOD
HCT: 42.9 % (ref 39.0–52.0)
HEMATOCRIT: 45.4 % (ref 39.0–52.0)
Hemoglobin: 14.7 g/dL (ref 13.0–17.0)
Hemoglobin: 15.6 g/dL (ref 13.0–17.0)

## 2015-03-20 MED ORDER — NICOTINE 21 MG/24HR TD PT24
21.0000 mg | MEDICATED_PATCH | Freq: Every day | TRANSDERMAL | Status: DC
  Administered 2015-03-20 – 2015-03-21 (×3): 21 mg via TRANSDERMAL
  Filled 2015-03-20 (×3): qty 1

## 2015-03-20 NOTE — Consult Note (Addendum)
Reason for Consult: Vomited blood. Referring Physician: THP.  [name] is an 60 y.o. male.  HPI: [name] is a 60 year old white male with a past medical history significant for HTN, hyperlipidemia, CVA with residual left-sided weakness in July 2016, paroxysmal atrial fibrillation on Xarelto; who presents with complaints of vomiting blood that started yesterday in the morning after breakfast.. Patient notes symptoms started acutely yesterday morning around 9:30 AM after he took his morning medications. He reports having vomited bilious contents several times followed by several bouts of hematemesis. He report only a small amount of blood after the third episode of vomiting; thereafter he vomited several blood clots. He denies any recent alcohol use and states last drink he had was back in December 2016. He suffers from constipation but denies having any melena or hematochezia. He has a fairly good appetite and his weight has been stable..  Past Medical History  Diagnosis Date  . Arthritis   . Hypertension   . Hyperlipidemia   . 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 identified on TEE at time of stroke  . Paroxysmal atrial fibrillation ([place])     a. identified on LINQ 08/2014  . Stroke ([place]) 07/25/14    a. s/p MDT ILR implant.  on xarelto  . Alcohol use with alcohol-induced mood disorder ([place])     quit almost all ETOH in 07/2014, previously drank 18 to 20 beers daily.   . Colon polyps 2011    Hyperplastic and 1 tubular adenoma.  Dr Collene Mares  . Gout    Past Surgical History  Procedure Laterality Date  . Loop recorder implant  07/27/14    LOOP REVEAL LINQ [id number] - [id number]  . Tee without cardioversion N/A 07/27/2014    Procedure: TRANSESOPHAGEAL ECHOCARDIOGRAM (TEE);  Surgeon: Pixie Casino, MD;  Location: [place];  Service: Cardiovascular;  Laterality: N/A;  . Ep implantable device N/A 07/27/2014    Procedure: Loop Recorder Insertion;  Surgeon: Evans Lance, MD;  Location: [place] CV LAB;   Service: Cardiovascular;  Laterality: N/A;  . Ep implantable device N/A 12/20/2014    Procedure: Loop Recorder Removal;  Surgeon: Evans Lance, MD;  Location: [place] CV LAB;  Service: Cardiovascular;  Laterality: N/A;  . Colonoscopy  2011    Dr Collene Mares.    Family History  Problem Relation Age of Onset  . Cancer Father     leukemia  . Heart attack Brother   . Hypertension Brother    Social History:  reports that he has been smoking cigarettes.  He has a 20 pack-year smoking history. He has never used smokeless tobacco. He reports that he drinks alcohol socially. He reports that he does not use illicit drugs.  Allergies: No Known Allergies  Medications: I have reviewed the patient's current medications.  Results for orders placed or performed during the hospital encounter of 03/19/15 (from the past 48 hour(s))  Lipase, blood     Status: None   Collection Time: 03/19/15  6:47 PM  Result Value Ref Range   Lipase 28 11 - 51 U/L  Comprehensive metabolic panel     Status: Abnormal   Collection Time: 03/19/15  6:47 PM  Result Value Ref Range   Sodium 139 135 - 145 mmol/L   Potassium 4.4 3.5 - 5.1 mmol/L   Chloride 99 (L) 101 - 111 mmol/L   CO2 26 22 - 32 mmol/L   Glucose, Bld 117 (H) 65 - 99 mg/dL   BUN 14  6 - 20 mg/dL   Creatinine, Ser 1.27 (H) 0.61 - 1.24 mg/dL   Calcium 10.2 8.9 - 10.3 mg/dL   Total Protein 7.9 6.5 - 8.1 g/dL   Albumin 4.2 3.5 - 5.0 g/dL   AST 27 15 - 41 U/L   ALT 29 17 - 63 U/L   Alkaline Phosphatase 76 38 - 126 U/L   Total Bilirubin 0.7 0.3 - 1.2 mg/dL   GFR calc non Af Amer >60 >60 mL/min   GFR calc Af Amer >60 >60 mL/min    Comment: (NOTE) The eGFR has been calculated using the CKD EPI equation. This calculation has not been validated in all clinical situations. eGFR's persistently <60 mL/min signify possible Chronic Kidney Disease.    Anion gap 14 5 - 15  CBC     Status: Abnormal   Collection Time: 03/19/15  6:47 PM  Result Value Ref Range    WBC 13.8 (H) 4.0 - 10.5 K/uL   RBC 5.40 4.22 - 5.81 MIL/uL   Hemoglobin 16.7 13.0 - 17.0 g/dL   HCT 48.3 39.0 - 52.0 %   MCV 89.4 78.0 - 100.0 fL   MCH 30.9 26.0 - 34.0 pg   MCHC 34.6 30.0 - 36.0 g/dL   RDW 13.5 11.5 - 15.5 %   Platelets 360 150 - 400 K/uL  Hemoglobin and hematocrit, blood     Status: None   Collection Time: 03/19/15 11:43 PM  Result Value Ref Range   Hemoglobin 15.6 13.0 - 17.0 g/dL   HCT 45.4 39.0 - 52.0 %  Prepare RBC     Status: None   Collection Time: 03/20/15  2:16 AM  Result Value Ref Range   Order Confirmation ORDER PROCESSED BY BLOOD BANK   Type and screen     Status: None (Preliminary result)   Collection Time: 03/20/15  2:34 AM  Result Value Ref Range   ABO/RH(D) A NEG    Antibody Screen NEG    Sample Expiration 03/23/2015    Unit Number [id number]    Blood Component Type RED CELLS,LR    Unit division 00    Status of Unit ALLOCATED    Transfusion Status OK TO TRANSFUSE    Crossmatch Result Compatible    Unit Number [id number]    Blood Component Type RED CELLS,LR    Unit division 00    Status of Unit ALLOCATED    Transfusion Status OK TO TRANSFUSE    Crossmatch Result Compatible    Unit Number [id number]    Blood Component Type RBC LR PHER1    Unit division 00    Status of Unit ALLOCATED    Transfusion Status OK TO TRANSFUSE    Crossmatch Result Compatible   ABO/Rh     Status: None   Collection Time: 03/20/15  2:34 AM  Result Value Ref Range   ABO/RH(D) A NEG   Urinalysis, Routine w reflex microscopic (not at [facility])     Status: None   Collection Time: 03/20/15  5:27 AM  Result Value Ref Range   Color, Urine YELLOW YELLOW   APPearance CLEAR CLEAR   Specific Gravity, Urine 1.011 1.005 - 1.030   pH 7.0 5.0 - 8.0   Glucose, UA NEGATIVE NEGATIVE mg/dL   Hgb urine dipstick NEGATIVE NEGATIVE   Bilirubin Urine NEGATIVE NEGATIVE   Ketones, ur NEGATIVE NEGATIVE mg/dL   Protein, ur NEGATIVE NEGATIVE mg/dL   Nitrite NEGATIVE NEGATIVE    Leukocytes, UA NEGATIVE NEGATIVE    Comment:  MICROSCOPIC NOT DONE ON URINES WITH NEGATIVE PROTEIN, BLOOD, LEUKOCYTES, NITRITE, OR GLUCOSE <1000 mg/dL.  Hemoglobin and hematocrit, blood     Status: None   Collection Time: 03/20/15  6:16 AM  Result Value Ref Range   Hemoglobin 14.7 13.0 - 17.0 g/dL   HCT 42.9 39.0 - 86.7 %  Basic metabolic panel     Status: None   Collection Time: 03/20/15  6:16 AM  Result Value Ref Range   Sodium 137 135 - 145 mmol/L   Potassium 4.2 3.5 - 5.1 mmol/L   Chloride 103 101 - 111 mmol/L   CO2 26 22 - 32 mmol/L   Glucose, Bld 91 65 - 99 mg/dL   BUN 11 6 - 20 mg/dL   Creatinine, Ser 1.01 0.61 - 1.24 mg/dL   Calcium 9.3 8.9 - 10.3 mg/dL   GFR calc non Af Amer >60 >60 mL/min   GFR calc Af Amer >60 >60 mL/min    Comment: (NOTE) The eGFR has been calculated using the CKD EPI equation. This calculation has not been validated in all clinical situations. eGFR's persistently <60 mL/min signify possible Chronic Kidney Disease.    Anion gap 8 5 - 15   Dg Chest 2 View  03/19/2015  CLINICAL DATA:  Hematemesis and this afternoon. EXAM: CHEST  2 VIEW COMPARISON:  None. FINDINGS: Interstitial coarsening which is likely bronchitic in this smoker. There is no edema, consolidation, effusion, or pneumothorax. Normal heart size and aortic contours. IMPRESSION: No acute finding. Bronchitic markings. Electronically Signed   By: Monte Fantasia M.D.   On: 03/19/2015 22:45   Review of Systems  Constitutional: Positive for malaise/fatigue. Negative for fever, chills, weight loss and diaphoresis.  HENT: Negative.   Eyes: Negative.   Respiratory: Negative.   Cardiovascular: Negative.   Gastrointestinal: Positive for heartburn, nausea, vomiting, abdominal pain and constipation. Negative for diarrhea, blood in stool and melena.  Genitourinary: Negative.   Skin: Negative.   Neurological: Positive for focal weakness and weakness.  Psychiatric/Behavioral: Positive for substance  abuse. The patient is nervous/anxious.    Blood pressure 146/79, pulse 65, temperature 98.5 F (36.9 C), temperature source Oral, resp. rate 19, height _0  (1.676 m), weight 75.751 kg (167 lb), SpO2 98 %. Physical Exam  Constitutional: He is oriented to person, place, and time. He appears well-developed and well-nourished.  HENT:  Head: Normocephalic and atraumatic.  Eyes: Conjunctivae and EOM are normal. Pupils are equal, round, and reactive to light.  Neck: Normal range of motion. Neck supple.  Cardiovascular: An irregularly irregular rhythm present.  Respiratory: Effort normal and breath sounds normal.  GI: Soft. Bowel sounds are normal.  Neurological: He is alert and oriented to person, place, and time.  Skin: Skin is warm and dry.  Psychiatric: He has a normal mood and affect. His behavior is normal. Judgment and thought content normal.   Assessment/Plan: 1) Hematemesis on Xarelto after several bouts of emesis-probably a Mallory Weiss tear. Will plan to do an EGD tomorrow. Xarelto on hold now. 2) Constipation-use Colace as needed.  3) History of colonic polyps.  4) HTN/Hyperlipidemia. 5)  History of atrial fibrillation on Xarelto.  6) S/P MCA stroke in 08/2014. 7) History of OSA. 8) Tobacco abuse.   Jwan Hornbaker 03/20/2015, 5:47 PM

## 2015-03-20 NOTE — Progress Notes (Signed)
Nutrition Brief Note  Patient identified on the Malnutrition Screening Tool (MST) Report.  Wt Readings from Last 15 Encounters:  03/19/15 167 lb (75.751 kg)  02/26/15 174 lb (78.926 kg)  01/22/15 181 lb (82.101 kg)  12/20/14 187 lb (84.823 kg)  12/18/14 178 lb 3.2 oz (80.831 kg)  12/10/14 177 lb 12.8 oz (80.65 kg)  10/30/14 180 lb (81.647 kg)  10/29/14 182 lb (82.555 kg)  10/03/14 182 lb 3.2 oz (82.645 kg)  09/10/14 184 lb 6.4 oz (83.643 kg)  09/06/14 183 lb 3.2 oz (83.099 kg)  09/03/14 182 lb (82.555 kg)  08/08/14 174 lb (78.926 kg)  07/29/14 169 lb (76.658 kg)  07/25/14 169 lb 12.1 oz (77.001 kg)    Body mass index is 26.97 kg/(m^2). Patient meets criteria for Overweight based on current BMI.   Current diet order is NPO, however, he is hungry.  Labs and medications reviewed.   No nutrition interventions warranted at this time. If nutrition issues arise, please consult RD.   Maureen Chatters, RD, LDN Pager #: [phone] After-Hours Pager #: [phone]

## 2015-03-20 NOTE — Progress Notes (Signed)
Family Medicine Teaching Service Daily Progress Note Intern Pager: [phone]  Patient name: [name] Medical record number: [id number] Date of birth: 09-Jun-1955 Age: 60 y.o. Gender: male  Primary Care Provider: JEFFERY,CHELLE, PA-C Consultants: Gastroenterology Code Status: Full  Pt Overview and Major Events to Date:  1/31: [facility] admitted [facility] for Hematemesis 2/1: Care transferred to [facility] Medicine Teaching Service  Assessment and Plan: [name] is a 60yo male who presented with hematemesis. PMH includes HTN, HLD, CVA with residual left sided weakness, atrial fibrillation.   # Hematemesis: 6 episodes of vomiting with last 3 including blood. Has resolved since hospitalization. Hemoglobin 16.7 at admission. History of alcohol use. Suspected to be secondary to Mallory-Weiss Tear. - Hemoglobin 14.7 - NPO pending GI consult - GI consulted. Appreciate recommendations. - Follow CBCs - Protonix - Zofran PRN  # Atrial Fibrillation: - Holding home Xarelto in setting of GI bleed - Monitor HR. Stable at 65-76.  # AKI: Baseline creatinine 0.74. Elevated to 1.27 at admission - Creatinine improved to 1.01 today - Continue NS@100 . Consider addition of D5 if remains NPO.  FEN/GI: NPO. NS@100 . PPx: SCD (Note Acute GI Bleed)  Disposition: Discharge home pending GI consult  Subjective:  Continues to denies any episodes of emesis during hospitalization. Last bowel movement yesterday, without blood. Denies abdominal pain. Anxious for diet or discharge.  Objective: Temp:  [97.8 F (36.6 C)-98.5 F (36.9 C)] 98.5 F (36.9 C) (02/01 1253) Pulse Rate:  [65-101] 65 (02/01 1253) Resp:  [18-23] 19 (02/01 1253) BP: (113-146)/(73-115) 146/79 mmHg (02/01 1253) SpO2:  [94 %-100 %] 98 % (02/01 1253) Weight:  [167 lb (75.751 kg)] 167 lb (75.751 kg) (01/31 2335) Physical Exam: General: 60yo male resting comfortably in no apparent distress Cardiovascular: S1 and S2 noted,  regular rhythm Respiratory: Clear to auscultation bilaterally. No increased work of breathing. Abdomen: Bowel sounds noted. No tenderness or masses to palpation. Nondistended. Extremities: No edema noted  Laboratory:  Recent Labs Lab 03/19/15 1847 03/19/15 2343 03/20/15 0616  WBC 13.8*  --   --   HGB 16.7 15.6 14.7  HCT 48.3 45.4 42.9  PLT 360  --   --     Recent Labs Lab 03/19/15 1847 03/20/15 0616  NA 139 137  K 4.4 4.2  CL 99* 103  CO2 26 26  BUN 14 11  CREATININE 1.27* 1.01  CALCIUM 10.2 9.3  PROT 7.9  --   BILITOT 0.7  --   ALKPHOS 76  --   ALT 29  --   AST 27  --   GLUCOSE 117* [address] [place], DO 03/20/2015, 4:00 PM PGY-2, [facility] Family Medicine FPTS Intern pager: [phone], text pages welcome

## 2015-03-21 ENCOUNTER — Encounter (HOSPITAL_COMMUNITY)
Admission: EM | Disposition: A | Discharge status: Home / Self Care | Payer: Self-pay | Source: Home / Self Care | Attending: Emergency Medicine

## 2015-03-21 ENCOUNTER — Encounter (HOSPITAL_COMMUNITY): Payer: Self-pay

## 2015-03-21 ENCOUNTER — Other Ambulatory Visit: Payer: Self-pay

## 2015-03-21 DIAGNOSIS — K920 Hematemesis: Secondary | ICD-10-CM | POA: Diagnosis not present

## 2015-03-21 DIAGNOSIS — R110 Nausea: Secondary | ICD-10-CM | POA: Diagnosis not present

## 2015-03-21 HISTORY — PX: ESOPHAGOGASTRODUODENOSCOPY: SHX5428

## 2015-03-21 LAB — CBC
HEMATOCRIT: 43.1 % (ref 39.0–52.0)
HEMOGLOBIN: 14.6 g/dL (ref 13.0–17.0)
MCH: 31.1 pg (ref 26.0–34.0)
MCHC: 33.9 g/dL (ref 30.0–36.0)
MCV: 91.7 fL (ref 78.0–100.0)
Platelets: 257 10*3/uL (ref 150–400)
RBC: 4.7 MIL/uL (ref 4.22–5.81)
RDW: 13.6 % (ref 11.5–15.5)
WBC: 7 10*3/uL (ref 4.0–10.5)

## 2015-03-21 LAB — HEPATITIS C ANTIBODY

## 2015-03-21 LAB — HEPATITIS B CORE ANTIBODY, TOTAL: Hep B Core Total Ab: NEGATIVE

## 2015-03-21 LAB — HEPATITIS B SURFACE ANTIGEN: HEP B S AG: NEGATIVE

## 2015-03-21 LAB — HEPATITIS B SURFACE ANTIBODY, QUANTITATIVE: Hepatitis B-Post: <3.1 m[IU]/mL — ABNORMAL LOW (ref >9.9)

## 2015-03-21 SURGERY — EGD (ESOPHAGOGASTRODUODENOSCOPY)
Anesthesia: Moderate Sedation

## 2015-03-21 MED ORDER — FENTANYL CITRATE (PF) 100 MCG/2ML IJ SOLN
INTRAMUSCULAR | Status: DC | PRN
  Administered 2015-03-21 (×2): 25 ug via INTRAVENOUS

## 2015-03-21 MED ORDER — FENTANYL CITRATE (PF) 100 MCG/2ML IJ SOLN
INTRAMUSCULAR | Status: AC
  Filled 2015-03-21: qty 2

## 2015-03-21 MED ORDER — MIDAZOLAM HCL 5 MG/ML IJ SOLN
INTRAMUSCULAR | Status: AC
  Filled 2015-03-21: qty 2

## 2015-03-21 MED ORDER — SODIUM CHLORIDE 0.9 % IV SOLN: INTRAVENOUS | Status: DC

## 2015-03-21 MED ORDER — MIDAZOLAM HCL 10 MG/2ML IJ SOLN
INTRAMUSCULAR | Status: DC | PRN
  Administered 2015-03-21: 1 mg via INTRAVENOUS
  Administered 2015-03-21 (×2): 2 mg via INTRAVENOUS

## 2015-03-21 MED ORDER — BUTAMBEN-TETRACAINE-BENZOCAINE 2-2-14 % EX AERO
INHALATION_SPRAY | CUTANEOUS | Status: DC | PRN
  Administered 2015-03-21: 2 via TOPICAL

## 2015-03-21 MED ORDER — DIPHENHYDRAMINE HCL 50 MG/ML IJ SOLN
INTRAMUSCULAR | Status: AC
  Filled 2015-03-21: qty 1

## 2015-03-21 MED ORDER — COLCHICINE 0.6 MG PO TABS
0.6000 mg | ORAL_TABLET | Freq: Two times a day (BID) | ORAL | Status: DC | PRN
  Administered 2015-03-21: 0.6 mg via ORAL
  Filled 2015-03-21: qty 1

## 2015-03-21 NOTE — Interval H&P Note (Signed)
History and Physical Interval Note:  03/21/2015 1:21 PM  [name]  has presented today for surgery, with the diagnosis of melena  The various methods of treatment have been discussed with the patient and family. After consideration of risks, benefits and other options for treatment, the patient has consented to  Procedure(s): ESOPHAGOGASTRODUODENOSCOPY (EGD) (N/A) as a surgical intervention .  The patient's history has been reviewed, patient examined, no change in status, stable for surgery.  I have reviewed the patient's chart and labs.  Questions were answered to the patient's satisfaction.     Tamica Covell D

## 2015-03-21 NOTE — Op Note (Signed)
[facility] [address] [place], [address]   ENDOSCOPY PROCEDURE REPORT  PATIENT: [name], [name]  MR#: [id number] BIRTHDATE: 02-Sep-1955 , 59  yrs. old GENDER: male ENDOSCOPIST:Meda Dudzinski Elnoria Howard, MD REFERRED BY: PROCEDURE DATE:  04-18-2015 PROCEDURE:   EGD, diagnostic ASA CLASS:    Class II INDICATIONS: Hematemesis MEDICATION: Fentanyl 50 mcg IV and Versed 5 mg IV TOPICAL ANESTHETIC:   none  DESCRIPTION OF PROCEDURE:   After the risks and benefits of the procedure were explained, informed consent was obtained.  The endoscope  endoscope was introduced through the mouth  and advanced to the second portion of the duodenum .  The instrument was slowly withdrawn as the mucosa was fully examined. Estimated blood loss is zero unless otherwise noted in this procedure report.   FINDINGS: The esophagus and gastroesophageal junction were completely normal in appearance.  The stomach was entered and closely examined.The antrum, angularis, and lesser curvature were well visualized, including a retroflexed view of the cardia and fundus.  The stomach wall was normally distensable.  There is subjective evidence of portal hypertensive gastropathy.  No evidence of esophagueal or fundic varices.  The scope passed easily through the pylorus into the duodenum.          The scope was then withdrawn from the patient and the procedure completed.  COMPLICATIONS: There were no immediate complications.  ENDOSCOPIC IMPRESSION: 1) Normal EGD - ? portal HTN gastropathy.  RECOMMENDATIONS: 1) No further GI work up at this time. 2) Follow up with Dr. Loreta Ave in 2-4 weeks.  _______________________________ eSignedJeani Hawking, MD 2015/04/18 1:49 PM    cc: CPT CODES: ICD CODES:

## 2015-03-21 NOTE — Telephone Encounter (Signed)
Duplicate

## 2015-03-21 NOTE — H&P (View-Only) (Signed)
Reason for Consult: Vomited blood. Referring Physician: THP.  [name] is an 60 y.o. male.  HPI: [name] is a 60 year old white male with a past medical history significant for HTN, hyperlipidemia, CVA with residual left-sided weakness in July 2016, paroxysmal atrial fibrillation on Xarelto; who presents with complaints of vomiting blood that started yesterday in the morning after breakfast.. Patient notes symptoms started acutely yesterday morning around 9:30 AM after he took his morning medications. He reports having vomited bilious contents several times followed by several bouts of hematemesis. He report only a small amount of blood after the third episode of vomiting; thereafter he vomited several blood clots. He denies any recent alcohol use and states last drink he had was back in December 2016. He suffers from constipation but denies having any melena or hematochezia. He has a fairly good appetite and his weight has been stable..  Past Medical History  Diagnosis Date  . Arthritis   . Hypertension   . Hyperlipidemia   . 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 identified on TEE at time of stroke  . Paroxysmal atrial fibrillation ([place])     a. identified on LINQ 08/2014  . Stroke ([place]) 07/25/14    a. s/p MDT ILR implant.  on xarelto  . Alcohol use with alcohol-induced mood disorder ([place])     quit almost all ETOH in 07/2014, previously drank 18 to 20 beers daily.   . Colon polyps 2011    Hyperplastic and 1 tubular adenoma.  Dr Collene Mares  . Gout    Past Surgical History  Procedure Laterality Date  . Loop recorder implant  07/27/14    LOOP REVEAL LINQ NAT55 - [id number]  . Tee without cardioversion N/A 07/27/2014    Procedure: TRANSESOPHAGEAL ECHOCARDIOGRAM (TEE);  Surgeon: Pixie Casino, MD;  Location: [facility];  Service: Cardiovascular;  Laterality: N/A;  . Ep implantable device N/A 07/27/2014    Procedure: Loop Recorder Insertion;  Surgeon: Evans Lance, MD;  Location: [facility];   Service: Cardiovascular;  Laterality: N/A;  . Ep implantable device N/A 12/20/2014    Procedure: Loop Recorder Removal;  Surgeon: Evans Lance, MD;  Location: [facility];  Service: Cardiovascular;  Laterality: N/A;  . Colonoscopy  2011    Dr Collene Mares.    Family History  Problem Relation Age of Onset  . Cancer Father     leukemia  . Heart attack Brother   . Hypertension Brother    Social History:  reports that he has been smoking cigarettes.  He has a 20 pack-year smoking history. He has never used smokeless tobacco. He reports that he drinks alcohol socially. He reports that he does not use illicit drugs.  Allergies: No Known Allergies  Medications: I have reviewed the patient's current medications.  Results for orders placed or performed during the hospital encounter of 03/19/15 (from the past 48 hour(s))  Lipase, blood     Status: None   Collection Time: 03/19/15  6:47 PM  Result Value Ref Range   Lipase 28 11 - 51 U/L  Comprehensive metabolic panel     Status: Abnormal   Collection Time: 03/19/15  6:47 PM  Result Value Ref Range   Sodium 139 135 - 145 mmol/L   Potassium 4.4 3.5 - 5.1 mmol/L   Chloride 99 (L) 101 - 111 mmol/L   CO2 26 22 - 32 mmol/L   Glucose, Bld 117 (H) 65 - 99 mg/dL   BUN 14  6 - 20 mg/dL   Creatinine, Ser 1.27 (H) 0.61 - 1.24 mg/dL   Calcium 10.2 8.9 - 10.3 mg/dL   Total Protein 7.9 6.5 - 8.1 g/dL   Albumin 4.2 3.5 - 5.0 g/dL   AST 27 15 - 41 U/L   ALT 29 17 - 63 U/L   Alkaline Phosphatase 76 38 - 126 U/L   Total Bilirubin 0.7 0.3 - 1.2 mg/dL   GFR calc non Af Amer >60 >60 mL/min   GFR calc Af Amer >60 >60 mL/min    Comment: (NOTE) The eGFR has been calculated using the CKD EPI equation. This calculation has not been validated in all clinical situations. eGFR's persistently <60 mL/min signify possible Chronic Kidney Disease.    Anion gap 14 5 - 15  CBC     Status: Abnormal   Collection Time: 03/19/15  6:47 PM  Result Value Ref Range    WBC 13.8 (H) 4.0 - 10.5 K/uL   RBC 5.40 4.22 - 5.81 MIL/uL   Hemoglobin 16.7 13.0 - 17.0 g/dL   HCT 48.3 39.0 - 52.0 %   MCV 89.4 78.0 - 100.0 fL   MCH 30.9 26.0 - 34.0 pg   MCHC 34.6 30.0 - 36.0 g/dL   RDW 13.5 11.5 - 15.5 %   Platelets 360 150 - 400 K/uL  Hemoglobin and hematocrit, blood     Status: None   Collection Time: 03/19/15 11:43 PM  Result Value Ref Range   Hemoglobin 15.6 13.0 - 17.0 g/dL   HCT 45.4 39.0 - 52.0 %  Prepare RBC     Status: None   Collection Time: 03/20/15  2:16 AM  Result Value Ref Range   Order Confirmation ORDER PROCESSED BY BLOOD BANK   Type and screen     Status: None (Preliminary result)   Collection Time: 03/20/15  2:34 AM  Result Value Ref Range   ABO/RH(D) A NEG    Antibody Screen NEG    Sample Expiration 03/23/2015    Unit Number [id number]    Blood Component Type RED CELLS,LR    Unit division 00    Status of Unit ALLOCATED    Transfusion Status OK TO TRANSFUSE    Crossmatch Result Compatible    Unit Number [id number]    Blood Component Type RED CELLS,LR    Unit division 00    Status of Unit ALLOCATED    Transfusion Status OK TO TRANSFUSE    Crossmatch Result Compatible    Unit Number [id number]    Blood Component Type RBC LR PHER1    Unit division 00    Status of Unit ALLOCATED    Transfusion Status OK TO TRANSFUSE    Crossmatch Result Compatible   ABO/Rh     Status: None   Collection Time: 03/20/15  2:34 AM  Result Value Ref Range   ABO/RH(D) A NEG   Urinalysis, Routine w reflex microscopic (not at [facility])     Status: None   Collection Time: 03/20/15  5:27 AM  Result Value Ref Range   Color, Urine YELLOW YELLOW   APPearance CLEAR CLEAR   Specific Gravity, Urine 1.011 1.005 - 1.030   pH 7.0 5.0 - 8.0   Glucose, UA NEGATIVE NEGATIVE mg/dL   Hgb urine dipstick NEGATIVE NEGATIVE   Bilirubin Urine NEGATIVE NEGATIVE   Ketones, ur NEGATIVE NEGATIVE mg/dL   Protein, ur NEGATIVE NEGATIVE mg/dL   Nitrite NEGATIVE NEGATIVE    Leukocytes, UA NEGATIVE NEGATIVE    Comment:  MICROSCOPIC NOT DONE ON URINES WITH NEGATIVE PROTEIN, BLOOD, LEUKOCYTES, NITRITE, OR GLUCOSE <1000 mg/dL.  Hemoglobin and hematocrit, blood     Status: None   Collection Time: 03/20/15  6:16 AM  Result Value Ref Range   Hemoglobin 14.7 13.0 - 17.0 g/dL   HCT 42.9 39.0 - 86.7 %  Basic metabolic panel     Status: None   Collection Time: 03/20/15  6:16 AM  Result Value Ref Range   Sodium 137 135 - 145 mmol/L   Potassium 4.2 3.5 - 5.1 mmol/L   Chloride 103 101 - 111 mmol/L   CO2 26 22 - 32 mmol/L   Glucose, Bld 91 65 - 99 mg/dL   BUN 11 6 - 20 mg/dL   Creatinine, Ser 1.01 0.61 - 1.24 mg/dL   Calcium 9.3 8.9 - 10.3 mg/dL   GFR calc non Af Amer >60 >60 mL/min   GFR calc Af Amer >60 >60 mL/min    Comment: (NOTE) The eGFR has been calculated using the CKD EPI equation. This calculation has not been validated in all clinical situations. eGFR's persistently <60 mL/min signify possible Chronic Kidney Disease.    Anion gap 8 5 - 15   Dg Chest 2 View  03/19/2015  CLINICAL DATA:  Hematemesis and this afternoon. EXAM: CHEST  2 VIEW COMPARISON:  None. FINDINGS: Interstitial coarsening which is likely bronchitic in this smoker. There is no edema, consolidation, effusion, or pneumothorax. Normal heart size and aortic contours. IMPRESSION: No acute finding. Bronchitic markings. Electronically Signed   By: Monte Fantasia M.D.   On: 03/19/2015 22:45   Review of Systems  Constitutional: Positive for malaise/fatigue. Negative for fever, chills, weight loss and diaphoresis.  HENT: Negative.   Eyes: Negative.   Respiratory: Negative.   Cardiovascular: Negative.   Gastrointestinal: Positive for heartburn, nausea, vomiting, abdominal pain and constipation. Negative for diarrhea, blood in stool and melena.  Genitourinary: Negative.   Skin: Negative.   Neurological: Positive for focal weakness and weakness.  Psychiatric/Behavioral: Positive for substance  abuse. The patient is nervous/anxious.    Blood pressure 146/79, pulse 65, temperature 98.5 F (36.9 C), temperature source Oral, resp. rate 19, height _0  (1.676 m), weight 75.751 kg (167 lb), SpO2 98 %. Physical Exam  Constitutional: He is oriented to person, place, and time. He appears well-developed and well-nourished.  HENT:  Head: Normocephalic and atraumatic.  Eyes: Conjunctivae and EOM are normal. Pupils are equal, round, and reactive to light.  Neck: Normal range of motion. Neck supple.  Cardiovascular: An irregularly irregular rhythm present.  Respiratory: Effort normal and breath sounds normal.  GI: Soft. Bowel sounds are normal.  Neurological: He is alert and oriented to person, place, and time.  Skin: Skin is warm and dry.  Psychiatric: He has a normal mood and affect. His behavior is normal. Judgment and thought content normal.   Assessment/Plan: 1) Hematemesis on Xarelto after several bouts of emesis-probably a Mallory Weiss tear. Will plan to do an EGD tomorrow. Xarelto on hold now. 2) Constipation-use Colace as needed.  3) History of colonic polyps.  4) HTN/Hyperlipidemia. 5)  History of atrial fibrillation on Xarelto.  6) S/P MCA stroke in 08/2014. 7) History of OSA. 8) Tobacco abuse.   Katena Petitjean 03/20/2015, 5:47 PM

## 2015-03-21 NOTE — Discharge Instructions (Signed)
You were admitted for hematemesis (vomiting blood). This was most likely caused by small tears in your esophagus [name] tears) that occurred during vomiting episodes before you first noticed the blood. We performed an EGD, which did not show anything concerning.   Please continue to take all your medications as you were prior to admission.

## 2015-03-21 NOTE — Telephone Encounter (Signed)
colchicine 0.6 MG tablet   Refill   [facility] [place] [place] - [address]   [phone]

## 2015-03-21 NOTE — Telephone Encounter (Signed)
Chelle, I only see that you have Rxd this once for pt on 01/11/15 phone message, and no refills put on it. Do you want to give RFs?

## 2015-03-21 NOTE — Discharge Summary (Signed)
Family Medicine Teaching [facility] Discharge Summary  Patient name: [name] Medical record number: [id number] Date of birth: 06/26/1955 Age: 60 y.o. Gender: male Date of Admission: 03/19/2015  Date of Discharge: 03/21/2015 Admitting Physician: Clydie Braun, MD  Primary Care Provider: JEFFERY,CHELLE, PA-C Consultants: GI  Indication for Hospitalization: hematemesis  Discharge Diagnoses/Problem List:  Patient Active Problem List   Diagnosis Date Noted  . PAF (paroxysmal atrial fibrillation) (HCC) 03/20/2015  . AKI (acute kidney injury) (HCC) 03/20/2015  . Leukocytosis 03/20/2015  . Hematemesis 03/19/2015  . Depression due to stroke (HCC) 02/26/2015  . Cerebral infarction due to thrombosis of right anterior cerebral artery (HCC) 10/29/2014  . Hypersomnia with sleep apnea 10/29/2014  . Embolic stroke involving right middle cerebral artery (HCC) 10/29/2014  . Nicotine use disorder 10/29/2014  . Left anterior shoulder pain 10/23/2014  . Alterations of sensations following CVA (cerebrovascular accident) 09/10/2014  . Atrial fibrillation (HCC) 09/06/2014  . Left spastic hemiparesis (HCC) 08/07/2014  . Left-sided neglect 08/07/2014  . Right middle cerebral artery stroke (HCC) 08/01/2014  . Tobacco dependence   . Carotid artery occlusion with infarction (HCC)   . History of ETOH abuse   . Hyperlipidemia   . Essential hypertension, benign 10/11/2013     Disposition: home  Discharge Condition: improved  Discharge Exam:  General: resting comfortably bed in NAD Cardiovascular: RRR, no murmurs appreciated Respiratory: Clear to auscultation bilaterally. No increased work of breathing. Abdomen: Hypoactive bowel sounds. Non-tender, non-distended, soft.  Extremities: No edema noted. Slight TTP of R great toe, no erythema or swelling.  Neuro: A&Ox3, no focal deficits  Brief Hospital Course:  [name] is a 60 yo M with PMH of HTN, HLD, CVA with residual L-sided weakness,  and a.fib on Xarelto, presenting with hematemesis.   Patient was admitted after six episodes of emesis, the last three of which contained bright red clots of blood. He has a history of EtOH abuse, but reported that his last drink was almost two months ago. On presentation, VS were stable, and Hgb was 16.7. CXR showed no abnormalities. Mallory-Weiss tear was thought to be most likely diagnosis, and he was subsequently admitted for further work-up.   After admission, patient had no further episodes of emesis. GI was consulted, who recommended EGD, which showed questionable portal HTN gastropathy but no other abnormalities. GI felt no further work-up was warranted inpatient, and patient could follow-up with Dr. Loreta Ave in 2-4 weeks. As such, he was deemed stable for discharge home with outpatient GI follow-up.    Issues for Follow Up:  1. Patient has not received Hep B vaccine. Recommend providing this at follow-up appointment.  2. Follow-up with Dr. Loreta Ave (GI) in 2-4 weeks.  Significant Procedures: EGD (2/2)  Significant Labs and Imaging:   Recent Labs Lab 03/19/15 1847 03/19/15 2343 03/20/15 0616 03/21/15 0410  WBC 13.8*  --   --  7.0  HGB 16.7 15.6 14.7 14.6  HCT 48.3 45.4 42.9 43.1  PLT 360  --   --  257    Recent Labs Lab 03/19/15 1847 03/20/15 0616  NA 139 137  K 4.4 4.2  CL 99* 103  CO2 26 26  GLUCOSE 117* 91  BUN 14 11  CREATININE 1.27* 1.01  CALCIUM 10.2 9.3  ALKPHOS 76  --   AST 27  --   ALT 29  --   ALBUMIN 4.2  --     EGD - Normal EGD; questionable portal HTN gastropathy. Recommendations: no further GI  workup at this time. Follow-up with Dr. Loreta Ave in 2-4 weeks.   Results/Tests Pending at Time of Discharge: none  Discharge Medications:    Medication List    STOP taking these medications        colchicine 0.6 MG tablet      TAKE these medications        atorvastatin 80 MG tablet  Commonly known as:  LIPITOR  TAKE 1 TABLET BY MOUTH DAILY AT 6 PM      BuPROPion HCl ER (XL) 450 MG Tb24  Take 1 tablet by mouth daily.     gabapentin 300 MG capsule  Commonly known as:  NEURONTIN  Take 1 capsule (300 mg total) by mouth 3 (three) times daily.     lisinopril 5 MG tablet  Commonly known as:  PRINIVIL,ZESTRIL  Take 1 tablet (5 mg total) by mouth daily.     rivaroxaban 20 MG Tabs tablet  Commonly known as:  XARELTO  Take 1 tablet (20 mg total) by mouth daily with supper.     traMADol 50 MG tablet  Commonly known as:  ULTRAM  Take 1 tablet (50 mg total) by mouth 2 (two) times daily.        Discharge Instructions: Please refer to Patient Instructions section of EMR for full details.  Patient was counseled important signs and symptoms that should prompt return to medical care, changes in medications, dietary instructions, activity restrictions, and follow up appointments.   Follow-Up Appointments: Follow-up Information    Schedule an appointment as soon as possible for a visit with JEFFERY,CHELLE, PA-C.   Specialty:  Family Medicine   Why:  For hospital follow-up   Contact information:   [address] [place] [address] [phone]       Follow up with MANN,JYOTHI, MD. Schedule an appointment as soon as possible for a visit in 2 weeks.   Specialty:  Gastroenterology   Why:  For hospital follow-up   Contact information:   [address], [place] [address] [phone]       Marquette Saa, MD 03/22/2015, 9:37 AM PGY-1, [facility] Family Medicine

## 2015-03-21 NOTE — Progress Notes (Signed)
Family Medicine Teaching Service Daily Progress Note Intern Pager: [phone]  Patient name: [name] Medical record number: [id number] Date of birth: May 15, 1955 Age: 59 y.o. Gender: male  Primary Care Provider: JEFFERY,CHELLE, PA-C Consultants: Gastroenterology Code Status: Full  Pt Overview and Major Events to Date:  1/31: Patient admitted to [facility] for hematemesis 2/1: Care transferred to [facility] Medicine Teaching Service  Assessment and Plan: Mr. Dettmer is a 60yo male who presented with hematemesis beginning 1/31. PMH includes HTN, HLD, CVA with residual left-sided weakness, and atrial fibrillation.   1. Hematemesis: 6 episodes of vomiting with last 3 including blood. Hemoglobin 16.7 at admission. History of alcohol use (reports last drink 01/2015). Suspected to be secondary to Mallory-Weiss tear. Neg Hep B & C. Hemoglobin stable at 14.6 (2/2). Per GI, EGD today. Still no vomiting since admission. Denies abdominal pain today.  - NPO for EGD - GI consulted. Appreciate recommendations. - Daily CBCs - Protonix - Zofran PRN  2. Atrial Fibrillation: HR stable at 68-72 overnight.  - Holding home Xarelto in setting of GI bleed - Monitor HR  3. AKI: Baseline creatinine 0.74. Elevated to 1.27 at admission. Creatinine improved to 1.01 (2/1). - Continue NS@100 .   FEN/GI: NPO. NS@100 . PPx: SCDs (Note Acute GI bleed)  Disposition: Discharge upon medical improvement   Subjective:  Patient seen by GI yesterday, who recommended EGD. Agree that likely diagnosis is [employer] tear. Patient denies any additional episodes of emesis since admission.  Endorses R toe pain starting last night, consistent with that he has experienced in prior gout flares. Last flare two years ago.   Objective: Temp:  [98 F (36.7 C)-98.5 F (36.9 C)] 98 F (36.7 C) (02/02 0506) Pulse Rate:  [65-72] 68 (02/02 0506) Resp:  [18-19] 18 (02/02 0506) BP: (108-150)/(73-79) 150/79 mmHg (02/02  0506) SpO2:  [98 %-100 %] 99 % (02/02 0506) Physical Exam: General: resting comfortably bed in NAD Cardiovascular: RRR, no murmurs appreciated Respiratory: Clear to auscultation bilaterally. No increased work of breathing. Abdomen: Hypoactive bowel sounds. Non-tender, non-distended, soft.  Extremities: No edema noted. Slight TTP of R great toe, no erythema or swelling.   Laboratory:  Recent Labs Lab 03/19/15 1847 03/19/15 2343 03/20/15 0616 03/21/15 0410  WBC 13.8*  --   --  7.0  HGB 16.7 15.6 14.7 14.6  HCT 48.3 45.4 42.9 43.1  PLT 360  --   --  257    Recent Labs Lab 03/19/15 1847 03/20/15 0616  NA 139 137  K 4.4 4.2  CL 99* 103  CO2 26 26  BUN 14 11  CREATININE 1.27* 1.01  CALCIUM 10.2 9.3  PROT 7.9  --   BILITOT 0.7  --   ALKPHOS 76  --   ALT 29  --   AST 27  --   GLUCOSE 117* 91   Abigail Shelbie Hutching, MD 03/21/2015, 9:04 AM PGY-1, [facility] FPTS Intern pager: [phone], text pages welcome

## 2015-03-22 ENCOUNTER — Encounter (HOSPITAL_COMMUNITY): Payer: Self-pay | Admitting: Gastroenterology

## 2015-03-22 LAB — TYPE AND SCREEN
ABO/RH(D): A NEG
ANTIBODY SCREEN: NEGATIVE
UNIT DIVISION: 0
Unit division: 0
Unit division: 0

## 2015-03-25 MED ORDER — COLCHICINE 0.6 MG PO TABS: 0.6000 mg | ORAL_TABLET | Freq: Two times a day (BID) | ORAL | Status: DC | PRN

## 2015-03-25 NOTE — Telephone Encounter (Signed)
Meds ordered this encounter  Medications  . colchicine 0.6 MG tablet    Sig: Take 1 tablet (0.6 mg total) by mouth 2 (two) times daily as needed (gout).    Dispense:  20 tablet    Refill:  4

## 2015-03-26 ENCOUNTER — Encounter: Payer: BLUE CROSS/BLUE SHIELD | Attending: Physical Medicine & Rehabilitation

## 2015-03-26 ENCOUNTER — Encounter: Payer: Self-pay | Admitting: Physical Medicine & Rehabilitation

## 2015-03-26 ENCOUNTER — Ambulatory Visit (HOSPITAL_BASED_OUTPATIENT_CLINIC_OR_DEPARTMENT_OTHER): Payer: BLUE CROSS/BLUE SHIELD | Admitting: Physical Medicine & Rehabilitation

## 2015-03-26 VITALS — BP 104/67 | HR 78 | Resp 14

## 2015-03-26 DIAGNOSIS — I10 Essential (primary) hypertension: Secondary | ICD-10-CM | POA: Diagnosis not present

## 2015-03-26 DIAGNOSIS — E785 Hyperlipidemia, unspecified: Secondary | ICD-10-CM | POA: Diagnosis not present

## 2015-03-26 DIAGNOSIS — M1990 Unspecified osteoarthritis, unspecified site: Secondary | ICD-10-CM | POA: Diagnosis not present

## 2015-03-26 DIAGNOSIS — M25512 Pain in left shoulder: Secondary | ICD-10-CM | POA: Diagnosis not present

## 2015-03-26 DIAGNOSIS — M129 Arthropathy, unspecified: Secondary | ICD-10-CM | POA: Diagnosis not present

## 2015-03-26 DIAGNOSIS — M19019 Primary osteoarthritis, unspecified shoulder: Secondary | ICD-10-CM

## 2015-03-26 DIAGNOSIS — I69354 Hemiplegia and hemiparesis following cerebral infarction affecting left non-dominant side: Secondary | ICD-10-CM | POA: Diagnosis not present

## 2015-03-26 DIAGNOSIS — F17200 Nicotine dependence, unspecified, uncomplicated: Secondary | ICD-10-CM | POA: Diagnosis not present

## 2015-03-26 NOTE — Progress Notes (Signed)
Acromioclavicular joint injection under ultrasound guidance  Indication is for anterior shoulder pain with imaging studies demonstrating acromioclavicular joint arthropathy Pain is only partially responsive to medication management and other conservative care. Pain interferes with ADLs and sleep  The patient was placed in a seated position the acromioclavicular joint was scanned in the long axis view, areas marked prepped with Betadine, sterile technique was utilized. Under direct long axis view 1% lidocaine was infiltrated to the skin and subcutaneous tissues. A 25-gauge 1.5 inch needle reached the acromioclavicular joint space. Then a solution containing 1 mL of lidocaine 1% and 0.5 ML of Celestone 6 mg per mL was injected. Patient tolerated procedure well. Images were saved. Postprocedure instructions given 

## 2015-03-26 NOTE — Patient Instructions (Signed)
We performed a left acromioclavicular joint injection. If this is not helpful the next step would be to do a nerve block left suprascapular under ultrasound guidance.

## 2015-03-28 ENCOUNTER — Ambulatory Visit: Payer: BLUE CROSS/BLUE SHIELD | Admitting: Nurse Practitioner

## 2015-04-02 ENCOUNTER — Ambulatory Visit: Payer: BLUE CROSS/BLUE SHIELD | Admitting: Neurology

## 2015-04-02 ENCOUNTER — Inpatient Hospital Stay: Payer: BLUE CROSS/BLUE SHIELD | Admitting: Physician Assistant

## 2015-04-15 ENCOUNTER — Ambulatory Visit: Payer: BLUE CROSS/BLUE SHIELD | Admitting: Nurse Practitioner

## 2015-04-16 ENCOUNTER — Encounter: Payer: Self-pay | Admitting: Nurse Practitioner

## 2015-04-16 ENCOUNTER — Ambulatory Visit: Payer: BLUE CROSS/BLUE SHIELD | Admitting: Physician Assistant

## 2015-04-23 ENCOUNTER — Ambulatory Visit (HOSPITAL_BASED_OUTPATIENT_CLINIC_OR_DEPARTMENT_OTHER): Payer: BLUE CROSS/BLUE SHIELD | Admitting: Physical Medicine & Rehabilitation

## 2015-04-23 ENCOUNTER — Encounter: Payer: Self-pay | Admitting: Physical Medicine & Rehabilitation

## 2015-04-23 ENCOUNTER — Encounter: Payer: BLUE CROSS/BLUE SHIELD | Attending: Physical Medicine & Rehabilitation

## 2015-04-23 VITALS — BP 99/71 | HR 82 | Resp 14

## 2015-04-23 DIAGNOSIS — G8929 Other chronic pain: Secondary | ICD-10-CM

## 2015-04-23 DIAGNOSIS — M1990 Unspecified osteoarthritis, unspecified site: Secondary | ICD-10-CM | POA: Diagnosis not present

## 2015-04-23 DIAGNOSIS — I69354 Hemiplegia and hemiparesis following cerebral infarction affecting left non-dominant side: Secondary | ICD-10-CM | POA: Insufficient documentation

## 2015-04-23 DIAGNOSIS — F17200 Nicotine dependence, unspecified, uncomplicated: Secondary | ICD-10-CM | POA: Diagnosis not present

## 2015-04-23 DIAGNOSIS — I10 Essential (primary) hypertension: Secondary | ICD-10-CM | POA: Insufficient documentation

## 2015-04-23 DIAGNOSIS — E785 Hyperlipidemia, unspecified: Secondary | ICD-10-CM | POA: Diagnosis not present

## 2015-04-23 DIAGNOSIS — M25512 Pain in left shoulder: Secondary | ICD-10-CM | POA: Diagnosis not present

## 2015-04-23 NOTE — Patient Instructions (Signed)
A suprascapular nerve block was performed today to help relieve shoulder pain.  It is hard to predict the duration of improvement.

## 2015-04-23 NOTE — Progress Notes (Signed)
Subjective:    Patient ID: [name], male    DOB: 07-25-1955, 60 y.o.   MRN: [id number]  HPI  Pain Inventory Average Pain 9 Pain Right Now 9 My pain is sharp, burning and tingling  In the last 24 hours, has pain interfered with the following? General activity 5 Relation with others 6 Enjoyment of life 8 What TIME of day is your pain at its worst? evening, night Sleep (in general) Fair  Pain is worse with: sitting Pain improves with: NA Relief from Meds: 4  Mobility walk without assistance how many minutes can you walk? 30 ability to climb steps?  yes do you drive?  no  Function disabled: date disabled 07/26/14 I need assistance with the following:  dressing, meal prep, household duties and shopping  Neuro/Psych depression anxiety loss of taste or smell  Prior Studies Any changes since last visit?  no  Physicians involved in your care Any changes since last visit?  no   Family History  Problem Relation Age of Onset  . Cancer Father     leukemia  . Heart attack Brother   . Hypertension Brother    Social History   Social History  . Marital Status: Married    Spouse Name: Marylu LundJanet  . Number of Children: 3  . 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 Smoking status: Current Every Day Smoker -- 0.50 packs/day for 40 years    Types: Cigarettes  . Smokeless tobacco: Never Used     Comment: cut back after stroke, but increased again due to depression  . Alcohol Use: 0.0 - 1.2 oz/week    0-2 Cans of beer per week     Comment: <12/week  . Drug Use: No  . Sexual Activity:    Partners: Female   Other Topics Concern  . None   Social History Narrative   Lives at home with wife, Marylu LundJanet   Caffeine use - tea 4-5 glasses a day   Past Surgical History  Procedure Laterality Date  . Loop recorder implant  07/27/14    LOOP REVEAL LINQ [id number] - [id number]  . Tee without  cardioversion N/A 07/27/2014    Procedure: TRANSESOPHAGEAL ECHOCARDIOGRAM (TEE);  Surgeon: Chrystie NoseKenneth C Hilty, MD;  [facility];  Service: Cardiovascular;  Laterality: N/A;  . Ep implantable device N/A 07/27/2014    Procedure: Loop Recorder Insertion;  Surgeon: Marinus MawGregg W Taylor, MD;  [facility];  Service: Cardiovascular;  Laterality: N/A;  . Ep implantable device N/A 12/20/2014    Procedure: Loop Recorder Removal;  Surgeon: Marinus MawGregg W Taylor, MD;  [facility];  Service: Cardiovascular;  Laterality: N/A;  . Colonoscopy  2011    Dr Loreta AveMann.   . Esophagogastroduodenoscopy N/A 03/21/2015    Procedure: ESOPHAGOGASTRODUODENOSCOPY (EGD);  Surgeon: Jeani HawkingPatrick Hung, MD;  [facility];  Service: Endoscopy;  Laterality: N/A;   Past Medical History  Diagnosis Date  . Arthritis   . Hypertension   . Hyperlipidemia   . 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 identified on TEE at time of stroke  . Paroxysmal atrial fibrillation (HCC)     a. identified on LINQ 08/2014  . Stroke (HCC) 07/25/14    a. s/p MDT ILR implant.  on xarelto  . Alcohol use with alcohol-induced mood disorder (HCC)     quit almost all  ETOH in 07/2014, previously drank 18 to 20 beers daily.   . Colon polyps 2011    Hyperplastic and 1 tubular adenoma.  Dr Loreta Ave  . Gout    There were no vitals taken for this visit.  Opioid Risk Score:   Fall Risk Score:  `1  Depression screen PHQ 2/9  Depression screen [facility] 2/9 02/26/2015 02/26/2015 01/22/2015 12/18/2014 10/30/2014 10/29/2014 09/10/2014  Decreased Interest 1 1 0 Down, Depressed, Hopeless 1 1 0 PHQ - 2 Score 2 2 0 Altered sleeping - 3 0 Tired, decreased energy - Change in appetite - 3 - - 0 1 0  Feeling bad or failure about yourself  - 0 0 1  Trouble concentrating - 0 0 0 1 1 0  Moving slowly or fidgety/restless - 1 0 0 0 0 1  Suicidal thoughts - 0 0 0 0 0 0  PHQ-9 Score - Difficult  doing work/chores - Somewhat difficult - - - Not difficult at all -     Review of Systems  Constitutional:       Loss of taste or smell   Psychiatric/Behavioral: Positive for dysphoric mood. The patient is nervous/anxious.   All other systems reviewed and are negative.      Objective:   Physical Exam        Assessment & Plan:  Indication chronic shoulder pain that is not responsive to medication management and other conservative care  Left Supra scapular nerve block - Left shoulder Pain is severe and interferes with activities, only partial response to intra artic injections  Informed consent was obtained after describing risks and benefits of the procedure including bleeding bruising and infection. he elects to proceed and has given written consent. Patient placed in a seated position medial to lateral approach utilized. Linear transducer placed in oblique coronal plane. Suprascapular notch identified. Needle inserted in plane medial to lateral. Once target was reached with needle tip, a solution containing one ML 6 ML per cc betamethasone and 4 mL 1% Lidocaine were injected. Patient tolerated procedure well. Post procedure instructions given

## 2015-05-17 ENCOUNTER — Other Ambulatory Visit: Payer: Self-pay

## 2015-05-17 MED ORDER — BUPROPION HCL ER (XL) 450 MG PO TB24: 1.0000 | ORAL_TABLET | Freq: Every day | ORAL | Status: DC

## 2015-05-21 ENCOUNTER — Other Ambulatory Visit: Payer: Self-pay | Admitting: *Deleted

## 2015-05-21 ENCOUNTER — Ambulatory Visit (HOSPITAL_BASED_OUTPATIENT_CLINIC_OR_DEPARTMENT_OTHER): Payer: BLUE CROSS/BLUE SHIELD | Admitting: Physical Medicine & Rehabilitation

## 2015-05-21 ENCOUNTER — Encounter: Payer: Self-pay | Admitting: Physical Medicine & Rehabilitation

## 2015-05-21 ENCOUNTER — Encounter: Payer: BLUE CROSS/BLUE SHIELD | Attending: Physical Medicine & Rehabilitation

## 2015-05-21 VITALS — BP 114/71 | HR 73 | Resp 14

## 2015-05-21 DIAGNOSIS — E785 Hyperlipidemia, unspecified: Secondary | ICD-10-CM | POA: Insufficient documentation

## 2015-05-21 DIAGNOSIS — G8114 Spastic hemiplegia affecting left nondominant side: Secondary | ICD-10-CM

## 2015-05-21 DIAGNOSIS — M1990 Unspecified osteoarthritis, unspecified site: Secondary | ICD-10-CM | POA: Diagnosis not present

## 2015-05-21 DIAGNOSIS — M25512 Pain in left shoulder: Secondary | ICD-10-CM | POA: Diagnosis not present

## 2015-05-21 DIAGNOSIS — M129 Arthropathy, unspecified: Secondary | ICD-10-CM | POA: Diagnosis not present

## 2015-05-21 DIAGNOSIS — I10 Essential (primary) hypertension: Secondary | ICD-10-CM | POA: Diagnosis not present

## 2015-05-21 DIAGNOSIS — F17200 Nicotine dependence, unspecified, uncomplicated: Secondary | ICD-10-CM | POA: Diagnosis not present

## 2015-05-21 DIAGNOSIS — I69354 Hemiplegia and hemiparesis following cerebral infarction affecting left non-dominant side: Secondary | ICD-10-CM | POA: Diagnosis present

## 2015-05-21 DIAGNOSIS — M19019 Primary osteoarthritis, unspecified shoulder: Secondary | ICD-10-CM

## 2015-05-21 MED ORDER — TRAMADOL HCL 50 MG PO TABS: 50.0000 mg | ORAL_TABLET | Freq: Two times a day (BID) | ORAL | Status: DC

## 2015-05-21 MED ORDER — GABAPENTIN 300 MG PO CAPS: 300.0000 mg | ORAL_CAPSULE | Freq: Three times a day (TID) | ORAL | Status: DC

## 2015-05-21 NOTE — Patient Instructions (Signed)
Next time we plan to freeze the nerve to the left shoulder joint

## 2015-05-21 NOTE — Progress Notes (Signed)
Subjective:    Patient ID: [name], male    DOB: 10-25-1955, 60 y.o.   MRN: [id number] Indication chronic shoulder pain that is not responsive to medication management and other conservative care  Left Supra scapular nerve block - Left shoulder Pain is severe and interferes with activities, only partial response to intra artic injections HPI Worked as a Midwifebutcher  Pain Inventory Average Pain 8 Pain Right Now 7 My pain is sharp, stabbing, tingling and aching  In the last 24 hours, has pain interfered with the following? General activity 7 Relation with others 8 Enjoyment of life 8 What TIME of day is your pain at its worst? night Sleep (in general) Poor  Pain is worse with: sitting Pain improves with: rest and injections Relief from Meds: 5  Mobility walk without assistance how many minutes can you walk? 30 ability to climb steps?  yes do you drive?  no  Function not employed: date last employed .  Neuro/Psych No problems in this area  Prior Studies Any changes since last visit?  no  Physicians involved in your care Any changes since last visit?  no   Family History  Problem Relation Age of Onset  . Cancer Father     leukemia  . Heart attack Brother   . Hypertension Brother    Social History   Social History  . Marital Status: Married    Spouse Name: Marylu LundJanet  . Number of Children: 3  . 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 Smoking status: Current Every Day Smoker -- 0.50 packs/day for 40 years    Types: Cigarettes  . Smokeless tobacco: Never Used     Comment: cut back after stroke, but increased again due to depression  . Alcohol Use: 0.0 - 1.2 oz/week    0-2 Cans of beer per week     Comment: <12/week  . Drug Use: No  . Sexual Activity:    Partners: Female   Other Topics Concern  . None   Social History Narrative   Lives at home with wife, Marylu LundJanet   Caffeine use - tea 4-5 glasses a day   Past Surgical History  Procedure Laterality Date  . Loop recorder implant  07/27/14    LOOP REVEAL LINQ [id number] - [id number]  . Tee without cardioversion N/A 07/27/2014    Procedure: TRANSESOPHAGEAL ECHOCARDIOGRAM (TEE);  Surgeon: Chrystie NoseKenneth C Hilty, MD;  [facility];  Service: Cardiovascular;  Laterality: N/A;  . Ep implantable device N/A 07/27/2014    Procedure: Loop Recorder Insertion;  Surgeon: Marinus MawGregg W Taylor, MD;  [facility];  Service: Cardiovascular;  Laterality: N/A;  . Ep implantable device N/A 12/20/2014    Procedure: Loop Recorder Removal;  Surgeon: Marinus MawGregg W Taylor, MD;  [facility];  Service: Cardiovascular;  Laterality: N/A;  . Colonoscopy  2011    Dr Loreta AveMann.   . Esophagogastroduodenoscopy N/A 03/21/2015    Procedure: ESOPHAGOGASTRODUODENOSCOPY (EGD);  Surgeon: Jeani HawkingPatrick Hung, MD;  [facility];  Service: Endoscopy;  Laterality: N/A;   Past Medical History  Diagnosis Date  . Arthritis   . Hypertension   . Hyperlipidemia   . 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 identified on TEE at time of stroke  . Paroxysmal atrial fibrillation (HCC)     a. identified on LINQ 08/2014  . Stroke [facility](HCC)  07/25/14    a. s/p MDT ILR implant.  on xarelto  . Alcohol use with alcohol-induced mood disorder (HCC)     quit almost all ETOH in 07/2014, previously drank 18 to 20 beers daily.   . Colon polyps 2011    Hyperplastic and 1 tubular adenoma.  Dr Loreta Ave  . Gout    BP 114/71 mmHg  Pulse 73  Resp 14  SpO2 98%  Opioid Risk Score:   Fall Risk Score:  `1  Depression screen PHQ 2/9  Depression screen [facility] 2/9 02/26/2015 02/26/2015 01/22/2015 12/18/2014 10/30/2014 10/29/2014 09/10/2014  Decreased Interest 1 1 0 Down, Depressed, Hopeless 1 1 0 PHQ - 2 Score 2 2 0 Altered sleeping - 3 0 Tired, decreased energy - Change in appetite - 3 - - 0 1 0  Feeling bad or failure about yourself   - 0 0 1  Trouble concentrating - 0 0 0 1 1 0  Moving slowly or fidgety/restless - 1 0 0 0 0 1  Suicidal thoughts - 0 0 0 0 0 0  PHQ-9 Score - Difficult doing work/chores - Somewhat difficult - - - Not difficult at all -    2 Review of Systems  All other systems reviewed and are negative.      Objective:   Physical Exam  Constitutional: He appears well-developed and well-nourished.  HENT:  Head: Normocephalic and atraumatic.  Eyes: Conjunctivae are normal. Pupils are equal, round, and reactive to light.  Neurological: He is alert.  Psychiatric: His affect is blunt. His speech is delayed.  Nursing note and vitals reviewed.   Mild subluxation left shoulder. He has 4 minus strength in the deltoid, bicep, tricep, grip Sensation is absent in the left upper extremity. Visual Fields are intact confrontation testing  Positive impingement sign left shoulder      Assessment & Plan:  1. Hemiplegic shoulder pain left side he's had fairly good improvement of his left upper extremity strength however he has sensory deficits which make his functional usage very poor.  In addition he has shoulder pain which is multifactorial, some of this is dysesthetic some of this is related to adhesive capsulitis. He has had some relief with suprascapular nerve block. Therefore I recommended suprascapular cryoablation

## 2015-06-03 ENCOUNTER — Other Ambulatory Visit: Payer: Self-pay | Admitting: Physician Assistant

## 2015-06-06 DIAGNOSIS — Z0271 Encounter for disability determination: Secondary | ICD-10-CM

## 2015-06-13 ENCOUNTER — Ambulatory Visit (HOSPITAL_BASED_OUTPATIENT_CLINIC_OR_DEPARTMENT_OTHER): Payer: BLUE CROSS/BLUE SHIELD | Admitting: Physical Medicine & Rehabilitation

## 2015-06-13 ENCOUNTER — Encounter: Payer: Self-pay | Admitting: Physical Medicine & Rehabilitation

## 2015-06-13 VITALS — BP 119/69 | HR 78

## 2015-06-13 DIAGNOSIS — M25512 Pain in left shoulder: Secondary | ICD-10-CM | POA: Diagnosis not present

## 2015-06-13 DIAGNOSIS — G8929 Other chronic pain: Secondary | ICD-10-CM | POA: Insufficient documentation

## 2015-06-13 DIAGNOSIS — I69354 Hemiplegia and hemiparesis following cerebral infarction affecting left non-dominant side: Secondary | ICD-10-CM | POA: Diagnosis not present

## 2015-06-13 DIAGNOSIS — G5692 Unspecified mononeuropathy of left upper limb: Secondary | ICD-10-CM

## 2015-06-13 DIAGNOSIS — M792 Neuralgia and neuritis, unspecified: Secondary | ICD-10-CM | POA: Insufficient documentation

## 2015-06-13 NOTE — Progress Notes (Signed)
Suprascapular nerve Cryoablation under ultrasound guidance Indication shoulder pain which is not relieved by analgesic medications or other conservative care. Pain is severe and interferes with functional activities. Informed consent was obtained after describing risks and benefits of the procedure with patient these include bleeding bruising and infection, the patient elected to proceed and has given written consent. Patient placed in a seated position with ipsilateral hand on opposite shoulder. Linear transducer placed along the spine of the scapula and moved superiorly into the suprascapular fossa, Area marked and prepped with Betadine 25-gauge 1.5 inch needle inserted, 2 cc of 1% lidocaine infiltrated Suprascapular notch identified. Doppler used to identify blood vessels. 18-gauge 1.5 inch needle was inserted targeting the suprascapular notch.Needle tip was proximally 1 cm proximal to suprascapular notch   5 mL 0.25% bupivacaine  Injected Cryoablation using 8 cm probe inserted through 18-gauge Needle  And into the suprascapular notch  Patient tolerated procedure well. Post procedure instructions given Return to clinic one month

## 2015-06-13 NOTE — Patient Instructions (Signed)
Cryoablation procedure today to the left shoulder goal is to achieve reduction in shoulder pain that would last 2-3 months.

## 2015-06-18 ENCOUNTER — Ambulatory Visit: Payer: BLUE CROSS/BLUE SHIELD | Admitting: Physician Assistant

## 2015-07-18 ENCOUNTER — Ambulatory Visit (HOSPITAL_BASED_OUTPATIENT_CLINIC_OR_DEPARTMENT_OTHER): Payer: BLUE CROSS/BLUE SHIELD | Admitting: Physical Medicine & Rehabilitation

## 2015-07-18 ENCOUNTER — Encounter: Payer: BLUE CROSS/BLUE SHIELD | Attending: Physical Medicine & Rehabilitation

## 2015-07-18 ENCOUNTER — Encounter: Payer: Self-pay | Admitting: Physical Medicine & Rehabilitation

## 2015-07-18 VITALS — BP 88/64 | HR 63 | Resp 14

## 2015-07-18 DIAGNOSIS — F17200 Nicotine dependence, unspecified, uncomplicated: Secondary | ICD-10-CM | POA: Diagnosis not present

## 2015-07-18 DIAGNOSIS — IMO0002 Reserved for concepts with insufficient information to code with codable children: Secondary | ICD-10-CM

## 2015-07-18 DIAGNOSIS — G5692 Unspecified mononeuropathy of left upper limb: Secondary | ICD-10-CM | POA: Diagnosis not present

## 2015-07-18 DIAGNOSIS — G8114 Spastic hemiplegia affecting left nondominant side: Secondary | ICD-10-CM

## 2015-07-18 DIAGNOSIS — I639 Cerebral infarction, unspecified: Secondary | ICD-10-CM

## 2015-07-18 DIAGNOSIS — E785 Hyperlipidemia, unspecified: Secondary | ICD-10-CM | POA: Diagnosis not present

## 2015-07-18 DIAGNOSIS — I952 Hypotension due to drugs: Secondary | ICD-10-CM | POA: Diagnosis not present

## 2015-07-18 DIAGNOSIS — R42 Dizziness and giddiness: Secondary | ICD-10-CM

## 2015-07-18 DIAGNOSIS — M792 Neuralgia and neuritis, unspecified: Secondary | ICD-10-CM

## 2015-07-18 DIAGNOSIS — I10 Essential (primary) hypertension: Secondary | ICD-10-CM | POA: Insufficient documentation

## 2015-07-18 DIAGNOSIS — M1990 Unspecified osteoarthritis, unspecified site: Secondary | ICD-10-CM | POA: Diagnosis not present

## 2015-07-18 DIAGNOSIS — I69354 Hemiplegia and hemiparesis following cerebral infarction affecting left non-dominant side: Secondary | ICD-10-CM | POA: Insufficient documentation

## 2015-07-18 DIAGNOSIS — F0631 Mood disorder due to known physiological condition with depressive features: Secondary | ICD-10-CM

## 2015-07-18 NOTE — Progress Notes (Signed)
Subjective:    Patient ID: [name], male    DOB: 1955/03/19, 60 y.o.   MRN: [id number]  HPI Cryoablation of the left suprascapular nerve 06/13/2015 was helpful in relieving his left shoulder pain. His chief complaint today is dizziness.  Dizziness is mainly with standing. He has had no falls. He has called his primary care provider but has not received a call back yet. We reviewed his blood pressure taken today.Systolic blood pressure was 88. Patient continues on lisinopril 5 mg a day but is not on any other antihypertensive medications. His by mouth intake has been fair. He drinks  Iced tea but has not been eating well. He has his ups and downs emotionally. He is on Wellbutrin Pain Inventory Average Pain 8 Pain Right Now 9 My pain is tingling and aching  In the last 24 hours, has pain interfered with the following? General activity 3 Relation with others 4 Enjoyment of life 2 What TIME of day is your pain at its worst? evening Sleep (in general) NA  Pain is worse with: sitting Pain improves with: rest Relief from Meds: 5  Mobility walk without assistance how many minutes can you walk? 30 ability to climb steps?  yes do you drive?  no Do you have any goals in this area?  no  Function disabled: date disabled 02/16/16 I need assistance with the following:  dressing  Neuro/Psych No problems in this area  Prior Studies Any changes since last visit?  no  Physicians involved in your care Any changes since last visit?  no   Family History  Problem Relation Age of Onset  . Cancer Father     leukemia  . Heart attack Brother   . Hypertension Brother    Social History   Social History  . Marital Status: Married    Spouse Name: Marylu LundJanet  . Number of Children: 3  . 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 Smoking status: Current Every Day Smoker -- 0.50 packs/day for 40 years    Types: Cigarettes  . Smokeless tobacco: Never Used     Comment: cut back after stroke, but increased again due to depression  . Alcohol Use: 0.0 - 1.2 oz/week    0-2 Cans of beer per week     Comment: <12/week  . Drug Use: No  . Sexual Activity:    Partners: Female   Other Topics Concern  . None   Social History Narrative   Lives at home with wife, Marylu LundJanet   Caffeine use - tea 4-5 glasses a day   Past Surgical History  Procedure Laterality Date  . Loop recorder implant  07/27/14    LOOP REVEAL LINQ [id number] - [id number]  . Tee without cardioversion N/A 07/27/2014    Procedure: TRANSESOPHAGEAL ECHOCARDIOGRAM (TEE);  Surgeon: Chrystie NoseKenneth C Hilty, MD;  [facility];  Service: Cardiovascular;  Laterality: N/A;  . Ep implantable device N/A 07/27/2014    Procedure: Loop Recorder Insertion;  Surgeon: Marinus MawGregg W Taylor, MD;  [facility];  Service: Cardiovascular;  Laterality: N/A;  . Ep implantable device N/A 12/20/2014    Procedure: Loop Recorder Removal;  Surgeon: Marinus MawGregg W Taylor, MD;  [facility];  Service: Cardiovascular;  Laterality: N/A;  . Colonoscopy  2011    Dr Loreta AveMann.   . Esophagogastroduodenoscopy N/A 03/21/2015  Procedure: ESOPHAGOGASTRODUODENOSCOPY (EGD);  Surgeon: Jeani Hawking, MD;  Location[facility]OSCOPY;  Service: Endoscopy;  Laterality: N/A;   Past Medical History  Diagnosis Date  . Arthritis   . Hypertension   . Hyperlipidemia   . 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 identified on TEE at time of stroke  . Paroxysmal atrial fibrillation (HCC)     a. identified on LINQ 08/2014  . Stroke (HCC) 07/25/14    a. s/p MDT ILR implant.  on xarelto  . Alcohol use with alcohol-induced mood disorder (HCC)     quit almost all ETOH in 07/2014, previously drank 18 to 20 beers daily.   . Colon polyps 2011    Hyperplastic and 1 tubular adenoma.  Dr Loreta Ave  . Gout    BP 88/64 mmHg  Pulse 63  Resp 14  SpO2 98%  Opioid Risk Score:   Fall Risk Score:   `1  Depression screen PHQ 2/9  Depression screen [facility] 2/9 06/13/2015 02/26/2015 02/26/2015 01/22/2015 12/18/2014 10/30/2014 10/29/2014  Decreased Interest 0 Down, Depressed, Hopeless 0 PHQ - 2 Score 0 Altered sleeping - - 3 0 Tired, decreased energy - - Change in appetite - - 3 - - 0 1  Feeling bad or failure about yourself  - - 0 0  Trouble concentrating - - 0 0 0 1 1  Moving slowly or fidgety/restless - - 1 0 0 0 0  Suicidal thoughts - - 0 0 0 0 0  PHQ-9 Score - - Difficult doing work/chores - - Somewhat difficult - - - Not difficult at all     Review of Systems  All other systems reviewed and are negative.      Objective:   Physical Exam  Constitutional: He appears well-developed and well-nourished.  HENT:  Head: Normocephalic and atraumatic.  Eyes: Conjunctivae are normal. Pupils are equal, round, and reactive to light.  Neck: Normal range of motion.  Neurological: He is alert. He displays no atrophy and no tremor. A sensory deficit is present. Coordination abnormal.  Decreased coordination left hand, fine motor  Psychiatric: His speech is normal. Thought content normal. His affect is blunt. He is slowed.  Nursing note and vitals reviewed.  Flex and abd to 90 degrees Patient has mild pain with internal and external rotation of the shoulder. He is able to touch behind his head as well as behind his back. His motor strength is 4 minus at the left biceps triceps finger flexors and extensors 3 minus at the deltoid limited by range of motion. Right-sided strength is 5/5 in the deltoid, biceps, triceps, grip Sensation is absent to light touch in the left upper extremity intact in the right upper extremity Evidence of left neglect. Ambulates without assistive device no evidence of toe drag or knee instability     Assessment & Plan:  1. Right MCA infarct with residual left upper extremity weakness as well  as sensory loss and left neglect. 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 Also do not recommend driving due to visual perceptual deficits. He is basically independent with his self-care and mobility without device. We discussed how much improvement he sat over time.  2. Left posterior shoulder pain improved after suprascapular cryoablation. We discussed that  is difficult to predict the duration of relief. I would expect approximately 2-3 months. We'll see the patient back in 3 months. If he has recurrence of pain he can call us.  3. Dizziness I think this is blood pressure related his neuro exam has not changed. I told him to hold his lisinopril and increase his fluid intake. He will follow-up with his primary M.D. On this.  4. Mood swings he does have post stroke depression. He is on Wellbutrin prescribed by his primary care provider. He will follow-up and see if any adjustments need to be made.

## 2015-07-18 NOTE — Patient Instructions (Signed)
Generic Shoulder Exercises  EXERCISES   RANGE OF MOTION (ROM) AND STRETCHING EXERCISES  These exercises may help you when beginning to rehabilitate your injury. Your symptoms may resolve with or without further involvement from your physician, physical therapist or athletic trainer. While completing these exercises, remember:   · Restoring tissue flexibility helps normal motion to return to the joints. This allows healthier, less painful movement and activity.  · An effective stretch should be held for at least 30 seconds.  · A stretch should never be painful. You should only feel a gentle lengthening or release in the stretched tissue.  ROM - Pendulum  · Bend at the waist so that your right / left arm falls away from your body. Support yourself with your opposite hand on a solid surface, such as a table or a countertop.  · Your right / left arm should be perpendicular to the ground. If it is not perpendicular, you need to lean over farther. Relax the muscles in your right / left arm and shoulder as much as possible.  · Gently sway your hips and trunk so they move your right / left arm without any use of your right / left shoulder muscles.  · Progress your movements so that your right / left arm moves side to side, then forward and backward, and finally, both clockwise and counterclockwise.  · Complete __________ repetitions in each direction. Many people use this exercise to relieve discomfort in their shoulder as well as to gain range of motion.  Repeat __________ times. Complete this exercise __________ times per day.  STRETCH - Flexion, Standing  · Stand with good posture. With an underhand grip on your right / left hand and an overhand grip on the opposite hand, grasp a broomstick or cane so that your hands are a little more than shoulder-width apart.  · Keeping your right / left elbow straight and shoulder muscles relaxed, push the stick with your opposite hand to raise your right / left arm in front of your  body and then overhead. Raise your arm until you feel a stretch in your right / left shoulder, but before you have increased shoulder pain.  · Try to avoid shrugging your right / left shoulder as your arm rises by keeping your shoulder blade tucked down and toward your mid-back spine. Hold __________ seconds.  · Slowly return to the starting position.  Repeat __________ times. Complete this exercise __________ times per day.  STRETCH - Internal Rotation  · Place your right / left hand behind your back, palm-up.  · Throw a towel or belt over your opposite shoulder. Grasp the towel/belt with your right / left hand.  · While keeping an upright posture, gently pull up on the towel/belt until you feel a stretch in the front of your right / left shoulder.  · Avoid shrugging your right / left shoulder as your arm rises by keeping your shoulder blade tucked down and toward your mid-back spine.  · Hold __________. Release the stretch by lowering your opposite hand.  Repeat __________ times. Complete this exercise __________ times per day.  STRETCH - External Rotation and Abduction  · Stagger your stance through a doorframe. It does not matter which foot is forward.  · As instructed by your physician, physical therapist or athletic trainer, place your hands:    And forearms above your head and on the door frame.    And forearms at head-height and on the door frame.      At elbow-height and on the door frame.  · Keeping your head and chest upright and your stomach muscles tight to prevent over-extending your low-back, slowly shift your weight onto your front foot until you feel a stretch across your chest and/or in the front of your shoulders.  · Hold __________ seconds. Shift your weight to your back foot to release the stretch.  Repeat __________ times. Complete this stretch __________ times per day.   STRENGTHENING EXERCISES   These exercises may help you when beginning to rehabilitate your injury. They may resolve your  symptoms with or without further involvement from your physician, physical therapist or athletic trainer. While completing these exercises, remember:   · Muscles can gain both the endurance and the strength needed for everyday activities through controlled exercises.  · Complete these exercises as instructed by your physician, physical therapist or athletic trainer. Progress the resistance and repetitions only as guided.  · You may experience muscle soreness or fatigue, but the pain or discomfort you are trying to eliminate should never worsen during these exercises. If this pain does worsen, stop and make certain you are following the directions exactly. If the pain is still present after adjustments, discontinue the exercise until you can discuss the trouble with your clinician.  · If advised by your physician, during your recovery, avoid activity or exercises which involve actions that place your right / left hand or elbow above your head or behind your back or head. These positions stress the tissues which are trying to heal.  STRENGTH - Scapular Depression and Adduction  · With good posture, sit on a firm chair. Supported your arms in front of you with pillows, arm rests or a table top. Have your elbows in line with the sides of your body.  · Gently draw your shoulder blades down and toward your mid-back spine. Gradually increase the tension without tensing the muscles along the top of your shoulders and the back of your neck.  · Hold for __________ seconds. Slowly release the tension and relax your muscles completely before completing the next repetition.  · After you have practiced this exercise, remove the arm support and complete it in standing as well as sitting.  Repeat __________ times. Complete this exercise __________ times per day.   STRENGTH - External Rotators  · Secure a rubber exercise band/tubing to a fixed object so that it is at the same height as your right / left elbow when you are standing  or sitting on a firm surface.  · Stand or sit so that the secured exercise band/tubing is at your side that is not injured.  · Bend your elbow 90 degrees. Place a folded towel or small pillow under your right / left arm so that your elbow is a few inches away from your side.  · Keeping the tension on the exercise band/tubing, pull it away from your body, as if pivoting on your elbow. Be sure to keep your body steady so that the movement is only coming from your shoulder rotating.  · Hold __________ seconds. Release the tension in a controlled manner as you return to the starting position.  Repeat __________ times. Complete this exercise __________ times per day.   STRENGTH - Supraspinatus  · Stand or sit with good posture. Grasp a __________ weight or an exercise band/tubing so that your hand is "thumbs-up," like when you shake hands.  · Slowly lift your right / left hand from your thigh into the air,   your mid-back spine.  Hold for __________ seconds. Control the descent of your hand as you slowly return to your starting position. Repeat __________ times. Complete this exercise __________ times per day.  STRENGTH - Shoulder Extensors  Secure a rubber exercise band/tubing so that it is at the height of your shoulders when you are either standing or sitting on a firm arm-less chair.  With a thumbs-up grip, grasp an end of the band/tubing in each hand. Straighten your elbows and lift your hands straight in front of you at shoulder height. Step back away from the secured end of band/tubing until it becomes tense.  Squeezing your shoulder blades together, pull your hands down  to the sides of your thighs. Do not allow your hands to go behind you.  Hold for __________ seconds. Slowly ease the tension on the band/tubing as you reverse the directions and return to the starting position. Repeat __________ times. Complete this exercise __________ times per day.  STRENGTH - Scapular Retractors  Secure a rubber exercise band/tubing so that it is at the height of your shoulders when you are either standing or sitting on a firm arm-less chair.  With a palm-down grip, grasp an end of the band/tubing in each hand. Straighten your elbows and lift your hands straight in front of you at shoulder height. Step back away from the secured end of band/tubing until it becomes tense.  Squeezing your shoulder blades together, draw your elbows back as you bend them. Keep your upper arm lifted away from your body throughout the exercise.  Hold __________ seconds. Slowly ease the tension on the band/tubing as you reverse the directions and return to the starting position. Repeat __________ times. Complete this exercise __________ times per day. STRENGTH - Scapular Depressors  Find a sturdy chair without wheels, such as a from a dining room table.  Keeping your feet on the floor, lift your bottom from the seat and lock your elbows.  Keeping your elbows straight, allow gravity to pull your body weight down. Your shoulders will rise toward your ears.  Raise your body against gravity by drawing your shoulder blades down your back, shortening the distance between your shoulders and ears. Although your feet should always maintain contact with the floor, your feet should progressively support less body weight as you get stronger.  Hold __________ seconds. In a controlled and slow manner, lower your body weight to begin the next repetition. Repeat __________ times. Complete this exercise __________ times per day.    This information is not intended to replace advice given to you by your health  care provider. Make sure you discuss any questions you have with your health care provider.   Document Released: 12/17/2004 Document Revised: 02/23/2014 Document Reviewed: 05/17/2008 Elsevier Interactive Patient Education 2016 [employer]   Hold lisinopril until you talk to your M.D.

## 2015-07-27 ENCOUNTER — Other Ambulatory Visit: Payer: Self-pay | Admitting: Internal Medicine

## 2015-08-06 ENCOUNTER — Encounter: Payer: Self-pay | Admitting: Physician Assistant

## 2015-08-06 ENCOUNTER — Ambulatory Visit (INDEPENDENT_AMBULATORY_CARE_PROVIDER_SITE_OTHER): Payer: BLUE CROSS/BLUE SHIELD | Admitting: Physician Assistant

## 2015-08-06 VITALS — BP 120/80 | HR 71 | Temp 98.5°F | Resp 16 | Ht 66.0 in | Wt 162.6 lb

## 2015-08-06 DIAGNOSIS — M109 Gout, unspecified: Secondary | ICD-10-CM | POA: Diagnosis not present

## 2015-08-06 DIAGNOSIS — I480 Paroxysmal atrial fibrillation: Secondary | ICD-10-CM | POA: Diagnosis not present

## 2015-08-06 DIAGNOSIS — E785 Hyperlipidemia, unspecified: Secondary | ICD-10-CM

## 2015-08-06 DIAGNOSIS — F1010 Alcohol abuse, uncomplicated: Secondary | ICD-10-CM

## 2015-08-06 DIAGNOSIS — I10 Essential (primary) hypertension: Secondary | ICD-10-CM

## 2015-08-06 DIAGNOSIS — F1011 Alcohol abuse, in remission: Secondary | ICD-10-CM

## 2015-08-06 DIAGNOSIS — F0631 Mood disorder due to known physiological condition with depressive features: Secondary | ICD-10-CM

## 2015-08-06 DIAGNOSIS — F17200 Nicotine dependence, unspecified, uncomplicated: Secondary | ICD-10-CM

## 2015-08-06 DIAGNOSIS — I639 Cerebral infarction, unspecified: Secondary | ICD-10-CM | POA: Diagnosis not present

## 2015-08-06 DIAGNOSIS — IMO0002 Reserved for concepts with insufficient information to code with codable children: Secondary | ICD-10-CM

## 2015-08-06 LAB — CBC WITH DIFFERENTIAL/PLATELET
BASOS PCT: 1 %
Basophils Absolute: 74 cells/uL (ref 0–200)
EOS PCT: 2 %
Eosinophils Absolute: 148 cells/uL (ref 15–500)
HEMATOCRIT: 48.5 % (ref 38.5–50.0)
HEMOGLOBIN: 16.7 g/dL (ref 13.2–17.1)
LYMPHS ABS: 2368 {cells}/uL (ref 850–3900)
LYMPHS PCT: 32 %
MCH: 31.5 pg (ref 27.0–33.0)
MCHC: 34.4 g/dL (ref 32.0–36.0)
MCV: 91.5 fL (ref 80.0–100.0)
MONO ABS: 666 {cells}/uL (ref 200–950)
MPV: 10.2 fL (ref 7.5–12.5)
Monocytes Relative: 9 %
Neutro Abs: 4144 cells/uL (ref 1500–7800)
Neutrophils Relative %: 56 %
Platelets: 287 10*3/uL (ref 140–400)
RBC: 5.3 MIL/uL (ref 4.20–5.80)
RDW: 13.9 % (ref 11.0–15.0)
WBC: 7.4 10*3/uL (ref 3.8–10.8)

## 2015-08-06 LAB — TSH: TSH: 0.89 mIU/L (ref 0.40–4.50)

## 2015-08-06 LAB — COMPREHENSIVE METABOLIC PANEL
ALK PHOS: 99 U/L (ref 40–115)
ALT: 21 U/L (ref 9–46)
AST: 18 U/L (ref 10–35)
Albumin: 4.1 g/dL (ref 3.6–5.1)
BILIRUBIN TOTAL: 0.6 mg/dL (ref 0.2–1.2)
BUN: 10 mg/dL (ref 7–25)
CALCIUM: 9.6 mg/dL (ref 8.6–10.3)
CO2: 27 mmol/L (ref 20–31)
CREATININE: 0.85 mg/dL (ref 0.70–1.33)
Chloride: 104 mmol/L (ref 98–110)
GLUCOSE: 99 mg/dL (ref 65–99)
Potassium: 4.2 mmol/L (ref 3.5–5.3)
SODIUM: 138 mmol/L (ref 135–146)
Total Protein: 7.4 g/dL (ref 6.1–8.1)

## 2015-08-06 LAB — LIPID PANEL
Cholesterol: 144 mg/dL (ref 125–200)
HDL: 33 mg/dL — ABNORMAL LOW (ref >=40)
LDL CALC: 84 mg/dL (ref <130)
TRIGLYCERIDES: 135 mg/dL (ref <150)
Total CHOL/HDL Ratio: 4.4 Ratio (ref <=5.0)
VLDL: 27 mg/dL (ref <30)

## 2015-08-06 LAB — URIC ACID: URIC ACID, SERUM: 5.8 mg/dL (ref 4.0–8.0)

## 2015-08-06 NOTE — Patient Instructions (Addendum)
Please call [facility] to schedule a follow-up. The neurologist is the one who can clear you to drive.  Continue your medications as they are. If your blood pressure gets low again, let me know.  Keep working on quitting smoking and drinking alcohol, to reduce your risk for having another stroke.    IF you received an x-ray today, you will receive an invoice from [facility]. Please contact [facility] at [phone] with questions or concerns regarding your invoice.   IF you received labwork today, you will receive an invoice from [facility]. Please contact [facility] at [phone] with questions or concerns regarding your invoice.   Our billing staff will not be able to assist you with questions regarding bills from these companies.  You will be contacted with the lab results as soon as they are available. The fastest way to get your results is to activate your My Chart account. Instructions are located on the last page of this paperwork. If you have not heard from us regarding the results in 2 weeks, please contact this office.

## 2015-08-06 NOTE — Progress Notes (Signed)
Patient ID: [name], male    DOB: Aug 13, 1955, 60 y.o.   MRN: [id number]  PCP: Areli Jowett, PA-C  Subjective:   Chief Complaint  Patient presents with  . Follow-up  . Hypertension  . pt would like to discuss lab results from visit  . Medication Refill    Colchicine 0.6 mg  . pt would like to discuss a daily med for    for Gout    HPI Presents for evaluation of HTN, hyperlipidemia and gout.  He did not receive my letter with the results of his last labs, and did not call to obtain them. His lipids had gone WAY up and I wondered if he had stopped statin therapy. He reports that he is, and was, very consistent with all his medications, except today. He spent last night at his daughter's house and did not have his meds to take this morning.  At his last visit with Dr. Wynn Banker, his BP was 88/60. He was advised to follow-up with me. Home readings have been 120's/60's. He had been feeling dizzy, but not since.  For the past week he's had less appetite than usual, and had 1 episode of vomiting several days ago. When he does feel hungry, he still just doesn't feel like eating. BM yesterday, but states "I haven't had a good one in a while." No abdominal pain, melena or hematochezia. He believes he's lost weight, and reports fatigue, cold intolerance and increased thirst.   No CP, SOB, HA, urinary changes.  Depressed mood continues. He's bored, has trouble sleeping. Wants clearance to drive. Thinks he'd feel less depressed if he could drive, feel more independent and get out of the house.  Continues to work on cutting back on tobacco and alcohol. Down to 2-3 cigarettes plus an e-cig each day. Down to <6 beers/week.  Dietary changes include eliminating fast foods, less fried foods, but continued salt.  Review of Systems As above.    Patient Active Problem List   Diagnosis Date Noted  . Chronic left shoulder pain 06/13/2015  . Neuropathic pain of shoulder 06/13/2015  .  Acromioclavicular joint arthritis 03/26/2015  . PAF (paroxysmal atrial fibrillation) (HCC) 03/20/2015  . AKI (acute kidney injury) (HCC) 03/20/2015  . Leukocytosis 03/20/2015  . Hematemesis 03/19/2015  . Depression due to stroke (HCC) 02/26/2015  . Cerebral infarction due to thrombosis of right anterior cerebral artery (HCC) 10/29/2014  . Hypersomnia with sleep apnea 10/29/2014  . Embolic stroke involving right middle cerebral artery (HCC) 10/29/2014  . Nicotine use disorder 10/29/2014  . Left anterior shoulder pain 10/23/2014  . Alterations of sensations following CVA (cerebrovascular accident) 09/10/2014  . Atrial fibrillation (HCC) 09/06/2014  . Left spastic hemiparesis (HCC) 08/07/2014  . Left-sided neglect 08/07/2014  . Right middle cerebral artery stroke (HCC) 08/01/2014  . Tobacco dependence   . Carotid artery occlusion with infarction (HCC)   . History of ETOH abuse   . Hyperlipidemia   . Essential hypertension, benign 10/11/2013     Prior to Admission medications   Medication Sig Start Date End Date Taking? Authorizing Provider  atorvastatin (LIPITOR) 80 MG tablet TAKE 1 TABLET BY MOUTH DAILY AT 6 PM 09/25/14  Yes Ethelda Chick, MD  BuPROPion HCl ER, XL, 450 MG TB24 Take 1 tablet by mouth daily. 05/17/15  Yes Ethelda Chick, MD  colchicine 0.6 MG tablet Take 1 tablet (0.6 mg total) by mouth 2 (two) times daily as needed (gout). 03/25/15  Yes Porfirio Oar, PA-C  gabapentin (NEURONTIN) 300 MG capsule Take 1 capsule (300 mg total) by mouth 3 (three) times daily. 05/21/15  Yes Erick ColaceAndrew E Kirsteins, MD  lisinopril (PRINIVIL,ZESTRIL) 5 MG tablet Take 1 tablet (5 mg total) by mouth daily. 10/17/14  Yes Erin Uecker, PA-C  rivaroxaban (XARELTO) 20 MG TABS tablet Take 1 tablet (20 mg total) by mouth daily with supper. Please call and schedule an appointment with Dr Ladona Ridgelaylor 07/29/15  Yes Marinus MawGregg W Taylor, MD  traMADol (ULTRAM) 50 MG tablet Take 1 tablet (50 mg total) by mouth 2 (two) times  daily. 05/21/15  Yes Erick ColaceAndrew E Kirsteins, MD     No Known Allergies     Objective:  Physical Exam  Constitutional: He is oriented to person, place, and time. He appears well-developed and well-nourished. He is active and cooperative. No distress.  BP 120/80 mmHg  Pulse 71  Temp(Src) 98.5 F (36.9 C) (Oral)  Resp 16  Ht 5\' 6"  (1.676 m)  Wt 162 lb 9.6 oz (73.755 kg)  BMI 26.26 kg/m2  SpO2 98%  HENT:  Head: Normocephalic and atraumatic.  Right Ear: Hearing normal.  Left Ear: Hearing normal.  Eyes: Conjunctivae are normal. No scleral icterus.  Neck: Normal range of motion. Neck supple. No thyromegaly present.  Cardiovascular: Normal rate and normal heart sounds.  An irregularly irregular rhythm present.  Pulses:      Radial pulses are 2+ on the right side, and 2+ on the left side.  Pulmonary/Chest: Effort normal and breath sounds normal.  Lymphadenopathy:       Head (right side): No tonsillar, no preauricular, no posterior auricular and no occipital adenopathy present.       Head (left side): No tonsillar, no preauricular, no posterior auricular and no occipital adenopathy present.    He has no cervical adenopathy.       Right: No supraclavicular adenopathy present.       Left: No supraclavicular adenopathy present.  Neurological: He is alert and oriented to person, place, and time. No sensory deficit.  Somewhat neglectful of the LEFT side. Turns to the LEFT when I approach him from the RIGHT.  Skin: Skin is warm, dry and intact. No rash noted. No cyanosis or erythema. Nails show no clubbing.  Psychiatric: His speech is normal and behavior is normal. His mood appears not anxious. His affect is not angry, not blunt, not labile and not inappropriate. He exhibits a depressed mood.           Assessment & Plan:   1. Hyperlipidemia Await lab results. Aggressively work to lower cholesterol. - Comprehensive metabolic panel - Lipid panel  2. Tobacco dependence Encouraged his  efforts for tobacco cessation.  3. Essential hypertension, benign Controlled. Continue to monitor at home. He will let me know if he has recurrent hypotensive readings. - CBC with Differential/Platelet - Comprehensive metabolic panel - TSH - POCT urinalysis dipstick - POCT Microscopic Urinalysis ([facility])  4. Depression due to stroke [facility](HCC) Encouraged to discuss driving privileges with his neurologist, though I do not think he can safely operate a motor vehicle at this time. Encourage considering other activities he enjoys and enrolling friends and family in doing them with him, providing transportation, etc.  5. History of ETOH abuse Congratulated on the considerable reduction in alcohol use.  6. Paroxysmal atrial fibrillation (HCC) Rate controlled and anticoagulated.  7. Gout, unspecified cause, unspecified chronicity, unspecified site Not currently having symptoms. Uric acid level today. If elevated will start Allopurinol. - Uric Acid  Fara Chute, PA-C Physician Assistant-Certified [facility]

## 2015-08-06 NOTE — Progress Notes (Signed)
Subjective:    Patient ID: [name], male    DOB: 1955-04-12, 60 y.o.   MRN: [id number]  Chief Complaint  Patient presents with  . Follow-up  . Hypertension  . pt would like to discuss lab results from visit  . Medication Refill    Colchicine 0.6 mg  . pt would like to discuss a daily med for    for Gout    HPI  Patient is 60 yo male who presents today for hypertension management.    He monitors it at home and has been getting readings of 120-122/65-67.  Describes experencing a few episodes of dizziness, went to see Dr. Kendrick Fries and had a BP of 88/60.  Dr. Kendrick Fries told patient to talk to PCP about welbutron, as it may be contributing to low BP.  Complains of anorexia for the last week, and 1 episode of vomiting a few days ago.  States he is just not hungry, and when he gets hungry he just doesn't want to eat.  Admits to constipation, had a BM yesterday but states "I havent had a good one for a while."  Denies abdominal pain, nausea, vomiting and diarrhea.  Compliant with meds most of the time, except this morning because he stayed with his daughter and left them at home.  Diet - Stopped eating fast food in the last few months and has tried to avoids fried foods, but he uses salt on everything. Drinks <6 beers/week, which is decreased from last years 18 beers/day  Current daily smoker of 2-3 cigerettes/day and also using e-cig.  He describes chronic shoulder pain, which has been evaluated previously, US showed arthritis in rotor cuff area.  Also sufferers from occasional gout flares, interested in preventative treatment to decrease acute episodes.  Patient describes depressed mood most days, exhaustion, boredom, anhedonia, and insomnia.   Anxious to know when he start driving again.  Believes his depression and anxiety would be decreased if he was able to  Admits to recent weight loss, fatigue, cold intolerence, occasional dizziness, and increased thirst.  Denies SOB,  dyspnea, change in urination, headache and no recent illness  Review of Systems All others negative except those listed in HPI.  Patient Active Problem List   Diagnosis Date Noted  . Chronic left shoulder pain 06/13/2015  . Neuropathic pain of shoulder 06/13/2015  . Acromioclavicular joint arthritis 03/26/2015  . PAF (paroxysmal atrial fibrillation) (HCC) 03/20/2015  . AKI (acute kidney injury) (HCC) 03/20/2015  . Leukocytosis 03/20/2015  . Hematemesis 03/19/2015  . Depression due to stroke (HCC) 02/26/2015  . Cerebral infarction due to thrombosis of right anterior cerebral artery (HCC) 10/29/2014  . Hypersomnia with sleep apnea 10/29/2014  . Embolic stroke involving right middle cerebral artery (HCC) 10/29/2014  . Nicotine use disorder 10/29/2014  . Left anterior shoulder pain 10/23/2014  . Alterations of sensations following CVA (cerebrovascular accident) 09/10/2014  . Atrial fibrillation (HCC) 09/06/2014  . Left spastic hemiparesis (HCC) 08/07/2014  . Left-sided neglect 08/07/2014  . Right middle cerebral artery stroke (HCC) 08/01/2014  . Tobacco dependence   . Carotid artery occlusion with infarction (HCC)   . History of ETOH abuse   . Hyperlipidemia   . Essential hypertension, benign 10/11/2013   Current Outpatient Prescriptions on File Prior to Visit  Medication Sig Dispense Refill  . atorvastatin (LIPITOR) 80 MG tablet TAKE 1 TABLET BY MOUTH DAILY AT 6 PM 90 tablet 3  . BuPROPion HCl ER, XL, 450 MG TB24 Take 1 tablet  by mouth daily. 90 tablet 2  . colchicine 0.6 MG tablet Take 1 tablet (0.6 mg total) by mouth 2 (two) times daily as needed (gout). 20 tablet 4  . gabapentin (NEURONTIN) 300 MG capsule Take 1 capsule (300 mg total) by mouth 3 (three) times daily. 270 capsule 1  . lisinopril (PRINIVIL,ZESTRIL) 5 MG tablet Take 1 tablet (5 mg total) by mouth daily. 90 tablet 3  . rivaroxaban (XARELTO) 20 MG TABS tablet Take 1 tablet (20 mg total) by mouth daily with supper.  Please call and schedule an appointment with Dr Ladona Ridgelaylor 90 tablet 0  . traMADol (ULTRAM) 50 MG tablet Take 1 tablet (50 mg total) by mouth 2 (two) times daily. 60 tablet 1   No current facility-administered medications on file prior to visit.   No Known Allergies     Objective: BP 120/80 mmHg  Pulse 71  Temp(Src) 98.5 F (36.9 C) (Oral)  Resp 16  Ht 5\' 6"  (1.676 m)  Wt 162 lb 9.6 oz (73.755 kg)  BMI 26.26 kg/m2  SpO2 98%    Physical Exam  Constitutional: He is oriented to person, place, and time. He appears well-developed and well-nourished.  Neck: Normal range of motion. Neck supple. No thyroid mass and no thyromegaly present.  Cardiovascular: Normal rate, normal heart sounds and intact distal pulses.  An irregularly irregular rhythm present. Exam reveals no gallop.   No murmur heard. Pulmonary/Chest: Effort normal and breath sounds normal.  Musculoskeletal: Normal range of motion.  Lymphadenopathy:    He has no cervical adenopathy.  Neurological: He is alert and oriented to person, place, and time.  Psychiatric: His behavior is normal. Judgment and thought content normal. He exhibits a depressed mood.       Assessment & Plan:  1. Hyperlipidemia Lab results pending, will adjust management if indicated. - Comprehensive metabolic panel - Lipid panel  2. Tobacco dependence Discussed tobacco cessation, patient not ready at this time.  Informed him when he is ready to contact us so we can help.  3. Essential hypertension, benign Stable.  Labs pending, will adjust medication if indicated.  Patient instructed to contact office if his BP gets low again and we will make adjustments. - CBC with Differential/Platelet - Comprehensive metabolic panel - TSH - POCT urinalysis dipstick - POCT Microscopic Urinalysis ([facility])  4. Depression due to stroke (HCC) Stable, instructed if depression worsens or suicidal thoughts develop, call the office or go to the emergency department.   [facility], they will be the ones to clear him to drive.  5. History of ETOH abuse Encouraged to continue decreasing his intake of alcohol.    6. Paroxysmal atrial fibrillation (HCC) Stable, continue current treatment.  7. Gout, unspecified cause, unspecified chronicity, unspecified site Stable, labs pending.  Will consider prophylactic treatment with allopurinol if indicated. - Uric Acid   Patient told he will be contacted with lab results and adjustment to management plan if necessary.  Return in 4 months for follow up of hypertension.   Cailee Blanke P. Luvern Mischke, PA-S

## 2015-08-08 ENCOUNTER — Telehealth: Payer: Self-pay | Admitting: Physical Medicine & Rehabilitation

## 2015-08-08 NOTE — Telephone Encounter (Signed)
Disability is needing current note, next appointment and return to work status faxed to [phone].  Please make note of claim #[id number].  Any questions please call [phone].

## 2015-08-09 ENCOUNTER — Encounter: Payer: Self-pay | Admitting: Physician Assistant

## 2015-08-09 NOTE — Telephone Encounter (Signed)
Please send note. Thank you

## 2015-09-11 ENCOUNTER — Other Ambulatory Visit: Payer: Self-pay | Admitting: Family Medicine

## 2015-09-11 DIAGNOSIS — E782 Mixed hyperlipidemia: Secondary | ICD-10-CM

## 2015-10-07 ENCOUNTER — Other Ambulatory Visit: Payer: Self-pay | Admitting: Nurse Practitioner

## 2015-10-07 ENCOUNTER — Telehealth: Payer: Self-pay | Admitting: Physical Medicine & Rehabilitation

## 2015-10-07 ENCOUNTER — Other Ambulatory Visit: Payer: Self-pay | Admitting: Internal Medicine

## 2015-10-07 MED ORDER — RIVAROXABAN 20 MG PO TABS: 20.0000 mg | ORAL_TABLET | Freq: Every day | ORAL | 0 refills | Status: DC

## 2015-10-07 NOTE — Telephone Encounter (Signed)
Patients wife called wanting a refill on Xeralto medication, I explained to her that Dr. Lewayne BuntingGregg Taylor prescribes that for him.  She will call his office to get that.

## 2015-10-17 ENCOUNTER — Encounter: Payer: BLUE CROSS/BLUE SHIELD | Admitting: Internal Medicine

## 2015-10-17 ENCOUNTER — Telehealth: Payer: Self-pay | Admitting: Internal Medicine

## 2015-10-17 NOTE — Telephone Encounter (Signed)
New message       Pt wife calling to get samples.     Patient calling the office for samples of medication:   1.  What medication and dosage are you requesting samples for? xarelto 20 mg   2.  Are you currently out of this medication? Yes

## 2015-10-17 NOTE — Telephone Encounter (Signed)
This medication has been refilled 10/07/15 by Dr Ladona Ridgelaylor.  Future refills will need to be managed by his primary care MD

## 2015-10-17 NOTE — Telephone Encounter (Signed)
Please advise 

## 2015-10-17 NOTE — Telephone Encounter (Addendum)
Xarelto was prescribed for afib found on LINQ recorder that was implanted for cryptogenic stroke.  Patient has had LINQ explanted and is not seeing Dr Ladona Ridgelaylor anymore.  He will need to get the medication from Neuro or whomever he is following for his CVA.  Please see 01/03/15 note

## 2015-10-18 ENCOUNTER — Ambulatory Visit (HOSPITAL_BASED_OUTPATIENT_CLINIC_OR_DEPARTMENT_OTHER): Payer: PRIVATE HEALTH INSURANCE | Admitting: Physical Medicine & Rehabilitation

## 2015-10-18 ENCOUNTER — Encounter: Payer: PRIVATE HEALTH INSURANCE | Attending: Physical Medicine & Rehabilitation

## 2015-10-18 ENCOUNTER — Encounter: Payer: Self-pay | Admitting: Physical Medicine & Rehabilitation

## 2015-10-18 VITALS — BP 137/80 | HR 60 | Resp 14

## 2015-10-18 DIAGNOSIS — M1990 Unspecified osteoarthritis, unspecified site: Secondary | ICD-10-CM | POA: Insufficient documentation

## 2015-10-18 DIAGNOSIS — I69398 Other sequelae of cerebral infarction: Secondary | ICD-10-CM

## 2015-10-18 DIAGNOSIS — I69354 Hemiplegia and hemiparesis following cerebral infarction affecting left non-dominant side: Secondary | ICD-10-CM | POA: Insufficient documentation

## 2015-10-18 DIAGNOSIS — E785 Hyperlipidemia, unspecified: Secondary | ICD-10-CM | POA: Diagnosis not present

## 2015-10-18 DIAGNOSIS — R208 Other disturbances of skin sensation: Secondary | ICD-10-CM | POA: Diagnosis not present

## 2015-10-18 DIAGNOSIS — G8114 Spastic hemiplegia affecting left nondominant side: Secondary | ICD-10-CM | POA: Diagnosis not present

## 2015-10-18 DIAGNOSIS — F17200 Nicotine dependence, unspecified, uncomplicated: Secondary | ICD-10-CM | POA: Insufficient documentation

## 2015-10-18 DIAGNOSIS — I10 Essential (primary) hypertension: Secondary | ICD-10-CM | POA: Insufficient documentation

## 2015-10-18 DIAGNOSIS — R414 Neurologic neglect syndrome: Secondary | ICD-10-CM

## 2015-10-18 DIAGNOSIS — R209 Unspecified disturbances of skin sensation: Secondary | ICD-10-CM

## 2015-10-18 DIAGNOSIS — I69898 Other sequelae of other cerebrovascular disease: Secondary | ICD-10-CM | POA: Diagnosis not present

## 2015-10-18 NOTE — Telephone Encounter (Signed)
LEAVING 2 BOTTLES OF XARELTO 20 MG AT FRONT DESK ALONG WITH PA PAPERS.

## 2015-10-18 NOTE — Progress Notes (Signed)
Subjective:    Patient ID: [name], male    DOB: 1956-02-16, 60 y.o.   MRN: [id number]  HPI Left shoulder pain. Still doing well. He is approximately 4 months status post cryoablation of the left suprascapular nerve.  Still smokes about a half-pack per day, drinks less than one beer per day  No falls, has follow-up appointments with cardiology, Loop recorder removed, primary care follow-up next month  Pain Inventory Average Pain 10 Pain Right Now 8 My pain is sharp  In the last 24 hours, has pain interfered with the following? General activity 5 Relation with others 4 Enjoyment of life 5 What TIME of day is your pain at its worst? daytime Sleep (in general) Good  Pain is worse with: unsure Pain improves with: medication and injections Relief from Meds: ?  Mobility walk without assistance how many minutes can you walk? 30 ability to climb steps?  yes do you drive?  no Do you have any goals in this area?  yes  Function I need assistance with the following:  dressing and bathing Do you have any goals in this area?  yes  Neuro/Psych depression  Prior Studies Any changes since last visit?  no  Physicians involved in your care Any changes since last visit?  no   Family History  Problem Relation Age of Onset  . Cancer Father     leukemia  . Heart attack Brother   . Hypertension Brother    Social History   Social History  . Marital status: Married    Spouse name: [name]  . Number of children: 3  . Years of education: 9   Occupational History  . former meat cutter     disabled due to CVA   Social History Main Topics  . Smoking status: Current Every Day Smoker    Packs/day: 0.50    Years: 40.00    Types: Cigarettes  . Smokeless tobacco: Never Used     Comment: cut back after stroke, but increased again due to depression  . Alcohol use 0.0 - 1.2 oz/week     Comment: <12/week  . Drug use:     Types: Marijuana     Comment: trying to help his  appetite  . Sexual activity: Yes    Partners: Female   Other Topics Concern  . None   Social History Narrative   Lives at home with wife, [name]   Caffeine use - tea 4-5 glasses a day   Past Surgical History:  Procedure Laterality Date  . COLONOSCOPY  2011   Dr Loreta Ave.   . EP IMPLANTABLE DEVICE N/A 07/27/2014   Procedure: Loop Recorder Insertion;  Surgeon: Marinus Maw, MD;  Location: [facility] INVASIVE CV LAB;  Service: Cardiovascular;  Laterality: N/A;  . EP IMPLANTABLE DEVICE N/A 12/20/2014   Procedure: Loop Recorder Removal;  Surgeon: Marinus Maw, MD;  Location: [facility] INVASIVE CV LAB;  Service: Cardiovascular;  Laterality: N/A;  . ESOPHAGOGASTRODUODENOSCOPY N/A 03/21/2015   Procedure: ESOPHAGOGASTRODUODENOSCOPY (EGD);  Surgeon: Jeani Hawking, MD;  Location: [facility] ENDOSCOPY;  Service: Endoscopy;  Laterality: N/A;  . LOOP RECORDER IMPLANT  07/27/14   LOOP REVEAL LINQ UXL24 - [id number]  . TEE WITHOUT CARDIOVERSION N/A 07/27/2014   Procedure: TRANSESOPHAGEAL ECHOCARDIOGRAM (TEE);  Surgeon: Chrystie Nose, MD;  Location[facility] ENDOSCOPY;  Service: Cardiovascular;  Laterality: N/A;   Past Medical History:  Diagnosis Date  . Alcohol use with alcohol-induced mood disorder (HCC)    quit almost all ETOH  in 07/2014, previously drank 18 to 20 beers daily.   . Arthritis   . Colon polyps 2011   Hyperplastic and 1 tubular adenoma.  Dr Loreta AveMann  . 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 identified on TEE at time of stroke  . Gout   . Hyperlipidemia   . Hypertension   . Paroxysmal atrial fibrillation (HCC)    a. identified on LINQ 08/2014  . Stroke (HCC) 07/25/14   a. s/p MDT ILR implant.  on xarelto   BP 137/80 (BP Location: Right Arm, Patient Position: Sitting, Cuff Size: Normal)   Pulse 60   Resp 14   SpO2 95%   Opioid Risk Score:   Fall Risk Score:  `1  Depression screen PHQ 2/9  [facility] 08/06/2015 06/13/2015 02/26/2015 02/26/2015 01/22/2015 12/18/2014 10/30/2014  Decreased Interest 0 1 1 1  0 3 3    Down, Depressed, Hopeless 0 1 1 1  0 3 3  PHQ - 2 Score 0 2 2 2  0 6 6  Altered sleeping - - - 3 0 3 2  Tired, decreased energy - - - 3 2 3 2   Change in appetite - - - 3 - - 0  Feeling bad or failure about yourself  - - - 0 2 3 1   Trouble concentrating - - - 0 0 0 1  Moving slowly or fidgety/restless - - - 1 0 0 0  Suicidal thoughts - - - 0 0 0 0  PHQ-9 Score - - - 12 4 15 12   Difficult doing work/chores - - - Somewhat difficult - - -    Review of Systems  HENT: Negative.   Eyes: Negative.   Respiratory: Negative.   Cardiovascular: Negative.   Gastrointestinal: Negative.   Endocrine: Negative.   Genitourinary: Negative.   Musculoskeletal: Positive for arthralgias.  Skin: Negative.   Allergic/Immunologic: Negative.   Neurological: Negative.   Hematological: Negative.   Psychiatric/Behavioral: Positive for dysphoric mood.  All other systems reviewed and are negative.      Objective:   Physical Exam  Constitutional: He is oriented to person, place, and time. He appears well-developed and well-nourished.  HENT:  Head: Normocephalic and atraumatic.  Eyes: EOM are normal. Pupils are equal, round, and reactive to light.  Neurological: He is alert and oriented to person, place, and time.  Psychiatric: His affect is blunt. He expresses impulsivity.  Nursing note and vitals reviewed.  Motor strength is 4 minus in the left bicep, tricep, finger flexors and extensors 3 plus at the deltoid. He has no pain with shoulder range of motion actively. His sensation is absent to light touch in the left hand. Present sensation in the left lower limb to light touch Ambulates without assisted device Mild left inattention noted on confrontation testing        Assessment & Plan:  1. Right MCA distribution infarct with left hemiparesis and some residual left neglect and cognitive deficits. Overall, he's made good progress and is now modified independent with his self-care and mobility. I  still do not think he is  ready for driving. Stroke, onset June 2016.  2. Left neuropathic shoulder pain related to his stroke, improved after cryoablation of the left suprascapular nerve. In April 2017. Still doing well, may repeat if pain returns

## 2015-12-10 ENCOUNTER — Ambulatory Visit (INDEPENDENT_AMBULATORY_CARE_PROVIDER_SITE_OTHER): Payer: PRIVATE HEALTH INSURANCE

## 2015-12-10 ENCOUNTER — Ambulatory Visit (INDEPENDENT_AMBULATORY_CARE_PROVIDER_SITE_OTHER): Payer: PRIVATE HEALTH INSURANCE | Admitting: Physician Assistant

## 2015-12-10 ENCOUNTER — Telehealth: Payer: Self-pay | Admitting: Physician Assistant

## 2015-12-10 ENCOUNTER — Encounter: Payer: Self-pay | Admitting: Physician Assistant

## 2015-12-10 VITALS — BP 120/82 | HR 73 | Temp 97.5°F | Resp 18 | Ht 66.0 in | Wt 167.6 lb

## 2015-12-10 DIAGNOSIS — Z114 Encounter for screening for human immunodeficiency virus [HIV]: Secondary | ICD-10-CM | POA: Diagnosis not present

## 2015-12-10 DIAGNOSIS — Z23 Encounter for immunization: Secondary | ICD-10-CM

## 2015-12-10 DIAGNOSIS — M25552 Pain in left hip: Secondary | ICD-10-CM

## 2015-12-10 DIAGNOSIS — E785 Hyperlipidemia, unspecified: Secondary | ICD-10-CM

## 2015-12-10 DIAGNOSIS — Z125 Encounter for screening for malignant neoplasm of prostate: Secondary | ICD-10-CM | POA: Diagnosis not present

## 2015-12-10 DIAGNOSIS — R414 Neurologic neglect syndrome: Secondary | ICD-10-CM | POA: Diagnosis not present

## 2015-12-10 DIAGNOSIS — M25512 Pain in left shoulder: Secondary | ICD-10-CM

## 2015-12-10 DIAGNOSIS — F17200 Nicotine dependence, unspecified, uncomplicated: Secondary | ICD-10-CM

## 2015-12-10 DIAGNOSIS — D72829 Elevated white blood cell count, unspecified: Secondary | ICD-10-CM

## 2015-12-10 LAB — CBC WITH DIFFERENTIAL/PLATELET
Basophils Absolute: 86 cells/uL (ref 0–200)
Basophils Relative: 1 %
EOS PCT: 2 %
Eosinophils Absolute: 172 cells/uL (ref 15–500)
HCT: 49.1 % (ref 38.5–50.0)
HEMOGLOBIN: 17.2 g/dL — AB (ref 13.2–17.1)
LYMPHS ABS: 2924 {cells}/uL (ref 850–3900)
Lymphocytes Relative: 34 %
MCH: 31.9 pg (ref 27.0–33.0)
MCHC: 35 g/dL (ref 32.0–36.0)
MCV: 91.1 fL (ref 80.0–100.0)
MPV: 9.8 fL (ref 7.5–12.5)
Monocytes Absolute: 774 cells/uL (ref 200–950)
Monocytes Relative: 9 %
NEUTROS PCT: 54 %
Neutro Abs: 4644 cells/uL (ref 1500–7800)
Platelets: 323 10*3/uL (ref 140–400)
RBC: 5.39 MIL/uL (ref 4.20–5.80)
RDW: 13.9 % (ref 11.0–15.0)
WBC: 8.6 10*3/uL (ref 3.8–10.8)

## 2015-12-10 LAB — COMPREHENSIVE METABOLIC PANEL
ALBUMIN: 4.5 g/dL (ref 3.6–5.1)
ALT: 23 U/L (ref 9–46)
AST: 20 U/L (ref 10–35)
Alkaline Phosphatase: 80 U/L (ref 40–115)
BILIRUBIN TOTAL: 0.8 mg/dL (ref 0.2–1.2)
BUN: 12 mg/dL (ref 7–25)
CO2: 20 mmol/L (ref 20–31)
CREATININE: 0.78 mg/dL (ref 0.70–1.33)
Calcium: 10 mg/dL (ref 8.6–10.3)
Chloride: 103 mmol/L (ref 98–110)
GLUCOSE: 96 mg/dL (ref 65–99)
Potassium: 4.4 mmol/L (ref 3.5–5.3)
SODIUM: 136 mmol/L (ref 135–146)
Total Protein: 8.1 g/dL (ref 6.1–8.1)

## 2015-12-10 LAB — LIPID PANEL
Cholesterol: 225 mg/dL — ABNORMAL HIGH (ref 125–200)
HDL: 31 mg/dL — ABNORMAL LOW (ref >=40)
LDL CALC: 160 mg/dL — AB (ref <130)
Total CHOL/HDL Ratio: 7.3 Ratio — ABNORMAL HIGH (ref <=5.0)
Triglycerides: 171 mg/dL — ABNORMAL HIGH (ref <150)
VLDL: 34 mg/dL — ABNORMAL HIGH (ref <30)

## 2015-12-10 NOTE — Progress Notes (Signed)
Patient ID: [name], male    DOB: 03-11-1955, 60 y.o.   MRN: [id number]  PCP: [name] Oar, PA-C  Subjective:   Chief Complaint  Patient presents with  . Follow-up    HPI Presents for evaluation of hyperlipidemia and depression.  In general, he's doing well. His mood is better than it has been, but still not back to baseline since his stroke.  Since his last visit with me, his mother had a heart attack. Her care is at [facility] and his wife and brothers help transport her to appointments. Finances continue to be a problem, especially since he doesn't qualify for Medicare until next year. The family's medical costs have been very high this year.  He has pain in the LEFT shoulder that is much improved following treatment with Dr. Larna Daughters, but still bothers him a lot.  New symptom of LEFT hip pain. Unable to walk distances due to pain. Brother had pain walking (unclear if it was the hip or lower leg) that was discovered to be due to atherosclerotic disease.  Continues to desire to drive. Frustrated that he cannot do the activities that he used to like to do.  Eats a lot of candies, ice cream, cookies. Sweet tea. Otherwise, they eat a pretty heart healthy diet. Appetite is improved with cannabis. Has quit alcohol. Continues to smoke.  Review of Systems No CP, SOB, HA. Occasional dizziness with rapid position change. No nausea, vomiting or diarrhea. No urinary burning, urgency or frequency.    Patient Active Problem List   Diagnosis Date Noted  . Chronic left shoulder pain 06/13/2015  . Neuropathic pain of shoulder 06/13/2015  . Acromioclavicular joint arthritis 03/26/2015  . PAF (paroxysmal atrial fibrillation) (HCC) 03/20/2015  . AKI (acute kidney injury) (HCC) 03/20/2015  . Leukocytosis 03/20/2015  . Hematemesis 03/19/2015  . Depression due to stroke (HCC) 02/26/2015  . Cerebral infarction due to thrombosis of right anterior cerebral artery (HCC) 10/29/2014  .  Hypersomnia with sleep apnea 10/29/2014  . Embolic stroke involving right middle cerebral artery (HCC) 10/29/2014  . Nicotine use disorder 10/29/2014  . Left anterior shoulder pain 10/23/2014  . Alterations of sensations following CVA (cerebrovascular accident) 09/10/2014  . Atrial fibrillation (HCC) 09/06/2014  . Left spastic hemiparesis (HCC) 08/07/2014  . Left-sided neglect 08/07/2014  . Right middle cerebral artery stroke (HCC) 08/01/2014  . Tobacco dependence   . Carotid artery occlusion with infarction (HCC)   . History of ETOH abuse   . Hyperlipidemia   . Essential hypertension, benign 10/11/2013     Prior to Admission medications   Medication Sig Start Date End Date Taking? Authorizing Provider  atorvastatin (LIPITOR) 80 MG tablet TAKE 1 TABLET BY MOUTH  DAILY AT 6 PM 09/16/15  Yes Dlynn Ranes, PA-C  BuPROPion HCl ER, XL, 450 MG TB24 Take 1 tablet by mouth daily. 05/17/15  Yes Ethelda Chick, MD  colchicine 0.6 MG tablet Take 1 tablet (0.6 mg total) by mouth 2 (two) times daily as needed (gout). 03/25/15  Yes Jess Sulak, PA-C  gabapentin (NEURONTIN) 300 MG capsule Take 1 capsule (300 mg total) by mouth 3 (three) times daily. 05/21/15  Yes Erick Colace, MD  lisinopril (PRINIVIL,ZESTRIL) 5 MG tablet Take 1 tablet (5 mg total) by mouth daily. 10/17/14  Yes Ieisha Gao, PA-C  rivaroxaban (XARELTO) 20 MG TABS tablet Take 1 tablet (20 mg total) by mouth daily with supper. 10/07/15  Yes Marinus Maw, MD  traMADol (ULTRAM) 50 MG tablet  Take 1 tablet (50 mg total) by mouth 2 (two) times daily. 05/21/15  Yes Erick ColaceAndrew E Kirsteins, MD  XARELTO 20 MG TABS tablet TAKE 1 TABLET(20 MG) BY MOUTH DAILY WITH DINNER 10/07/15  Yes Amber Caryl BisK Seiler, NP     No Known Allergies     Objective:  Physical Exam  Constitutional: He is oriented to person, place, and time. He appears well-developed and well-nourished. He is active and cooperative. No distress.  BP 120/82   Pulse 73   Temp 97.5 F  (36.4 C) (Oral)   Resp 18   Ht 5\' 6"  (1.676 m)   Wt 167 lb 9.6 oz (76 kg)   SpO2 98%   BMI 27.05 kg/m   HENT:  Head: Normocephalic and atraumatic.  Right Ear: Hearing normal.  Left Ear: Hearing normal.  Eyes: Conjunctivae are normal. No scleral icterus.  Neck: Normal range of motion. Neck supple. No thyromegaly present.  Cardiovascular: Normal rate, regular rhythm and normal heart sounds.   Pulses:      Radial pulses are 2+ on the right side, and 2+ on the left side.  Pulmonary/Chest: Effort normal and breath sounds normal.  Musculoskeletal:       Left hip: Normal.       Left knee: Normal.  Lymphadenopathy:       Head (right side): No tonsillar, no preauricular, no posterior auricular and no occipital adenopathy present.       Head (left side): No tonsillar, no preauricular, no posterior auricular and no occipital adenopathy present.    He has no cervical adenopathy.       Right: No supraclavicular adenopathy present.       Left: No supraclavicular adenopathy present.  Neurological: He is alert and oriented to person, place, and time. No sensory deficit.  Neglects the LEFT side. When I approach him from the RIGHT, he turns away to the LEFT.  Skin: Skin is warm, dry and intact. No rash noted. No cyanosis or erythema. Nails show no clubbing.  Psychiatric: He has a normal mood and affect. His speech is normal and behavior is normal.           Assessment & Plan:   1. Hyperlipidemia, unspecified hyperlipidemia type Await labs. Adjust regimen as indicated by results. - Comprehensive metabolic panel - Lipid panel  2. Left-sided neglect S/p CVA.  3. Left anterior shoulder pain Continue follow-up with Dr. Larna DaughtersKirstens.  4. Leukocytosis, unspecified type Await lab results. - CBC with Differential/Platelet  5. Pain of left hip joint Await radiograph report. Consider treatments based on findings. - DG HIP UNILAT W OR W/O PELVIS 2-3 VIEWS LEFT; Future  6. Tobacco  dependence Encouraged smoking cessation.  7. Flu vaccine need - Flu Vaccine QUAD 36+ mos IM  8. Need for Tdap vaccination - Tdap vaccine greater than or equal to 7yo IM  9. Screening for prostate cancer - PSA  10. Screening for HIV (human immunodeficiency virus) - HIV antibody     Fernande Brashelle S. Sharna Gabrys, PA-C Physician Assistant-Certified [facility]

## 2015-12-10 NOTE — Telephone Encounter (Signed)
Please call Dr. Jolee EwingJyothi [name]'s office. This patient had a colonoscopy in 06/2009 that revealed a hyperplastic polyp. He does not recall any abnormality. When do they recommend follow-up?

## 2015-12-10 NOTE — Patient Instructions (Signed)
     IF you received an x-ray today, you will receive an invoice from [facility]. Please contact [facility] at [phone] with questions or concerns regarding your invoice.   IF you received labwork today, you will receive an invoice from [facility]. Please contact [facility] at [phone] with questions or concerns regarding your invoice.   Our billing staff will not be able to assist you with questions regarding bills from these companies.  You will be contacted with the lab results as soon as they are available. The fastest way to get your results is to activate your My Chart account. Instructions are located on the last page of this paperwork. If you have not heard from us regarding the results in 2 weeks, please contact this office.      

## 2015-12-11 LAB — HIV ANTIBODY (ROUTINE TESTING W REFLEX): HIV: NONREACTIVE

## 2015-12-25 ENCOUNTER — Telehealth: Payer: Self-pay

## 2015-12-25 NOTE — Telephone Encounter (Signed)
PATIENT STATES HE SAW [name] A LITTLE OVER A WEEK AGO AND SHE DREW SOME LABS ON HIM. HE WOULD LIKE TO GET THE RESULTS. HE ESPECIALLY WOULD LIKE TO KNOW WHAT HIS CHOLESTEROL WAS. BEST PHONE [phone] (CELL)  PHARMACY CHOICE IS [facility] ON [address]. MBC

## 2015-12-28 NOTE — Telephone Encounter (Signed)
Patients lipids are elevated, will you review? Does not look like you have commented on the results yet

## 2015-12-30 NOTE — Telephone Encounter (Signed)
1. Please apologize to [name] on my behalf for the delay in getting his results to him.  2. The labs are all fine except the cholesterol, which has gone up a lot from 4 months ago, but not as high as it was 10 months ago. What's the difference? Has he changed his eating? Had he stopped the Lipitor? 

## 2016-01-01 NOTE — Telephone Encounter (Signed)
Left message voice mail of cell number to call back concerning cholesterol.

## 2016-01-02 ENCOUNTER — Encounter: Payer: Self-pay | Admitting: Radiology

## 2016-01-08 NOTE — Telephone Encounter (Signed)
[facility]. Advised I will send a letter if we don't hear back from him.

## 2016-01-15 NOTE — Telephone Encounter (Deleted)
1. Please apologize to [name] on my behalf for the delay in getting his results to him.  2. The labs are all fine except the cholesterol, which has gone up a lot from 4 months ago, but not as high as it was 10 months ago. What's the difference? Has he changed his eating? Had he stopped the Lipitor?

## 2016-01-15 NOTE — Telephone Encounter (Signed)
Sent unable to reach letter w/Chelle's message and copy of lab results.

## 2016-01-21 ENCOUNTER — Telehealth: Payer: Self-pay | Admitting: *Deleted

## 2016-01-21 NOTE — Telephone Encounter (Signed)
Patients wife requested at least one bottle of xarelto samples. I will place two bottles at the front desk. Patients wife very appreciative.

## 2016-01-28 ENCOUNTER — Telehealth: Payer: Self-pay | Admitting: *Deleted

## 2016-01-28 NOTE — Telephone Encounter (Signed)
Patient received unable to reach letter. We made several attempts to contact him with his lab results from his visit 11/2015. He thinks he's not been taking the statin. He will resume it and return for repeat labs.

## 2016-04-16 ENCOUNTER — Ambulatory Visit: Payer: PRIVATE HEALTH INSURANCE | Admitting: Physical Medicine & Rehabilitation

## 2016-05-21 ENCOUNTER — Encounter: Payer: Self-pay | Admitting: Internal Medicine

## 2016-06-08 ENCOUNTER — Encounter: Payer: Self-pay | Admitting: Internal Medicine

## 2016-06-08 ENCOUNTER — Ambulatory Visit (INDEPENDENT_AMBULATORY_CARE_PROVIDER_SITE_OTHER): Payer: Self-pay | Admitting: Internal Medicine

## 2016-06-08 VITALS — BP 124/70 | HR 62 | Ht 67.0 in | Wt 174.1 lb

## 2016-06-08 DIAGNOSIS — I480 Paroxysmal atrial fibrillation: Secondary | ICD-10-CM

## 2016-06-08 DIAGNOSIS — I10 Essential (primary) hypertension: Secondary | ICD-10-CM

## 2016-06-08 LAB — LIPID PANEL
CHOLESTEROL TOTAL: 235 mg/dL — AB (ref 100–199)
Chol/HDL Ratio: 6.2 ratio — ABNORMAL HIGH (ref 0.0–5.0)
HDL: 38 mg/dL — ABNORMAL LOW (ref >39)
LDL Calculated: 169 mg/dL — ABNORMAL HIGH (ref 0–99)
Triglycerides: 141 mg/dL (ref 0–149)
VLDL Cholesterol Cal: 28 mg/dL (ref 5–40)

## 2016-06-08 NOTE — Progress Notes (Signed)
HPI [name] returns today for followup. He is a pleasant 61 yo man with a stroke who underwent insertion of an ILR almost 2 years ago.He was found to have atrial fib. He has been placed on Xarelto. He has no additional complaints. No syncope. He still has a left HP, worse in the arm than the leg. He has had difficulty with Xarelto due to the cost. He is also concerned about his cholesterol.  No Known Allergies   Current Outpatient Prescriptions  Medication Sig Dispense Refill  . atorvastatin (LIPITOR) 80 MG tablet TAKE 1 TABLET BY MOUTH  DAILY AT 6 PM 90 tablet 1  . BuPROPion HCl ER, XL, 450 MG TB24 Take 1 tablet by mouth daily. 90 tablet 2  . colchicine 0.6 MG tablet Take 1 tablet (0.6 mg total) by mouth 2 (two) times daily as needed (gout). 20 tablet 4  . gabapentin (NEURONTIN) 300 MG capsule Take 1 capsule (300 mg total) by mouth 3 (three) times daily. 270 capsule 1  . lisinopril (PRINIVIL,ZESTRIL) 5 MG tablet Take 1 tablet (5 mg total) by mouth daily. 90 tablet 3  . rivaroxaban (XARELTO) 20 MG TABS tablet Take 1 tablet (20 mg total) by mouth daily with supper. 30 tablet 0  . traMADol (ULTRAM) 50 MG tablet Take 1 tablet (50 mg total) by mouth 2 (two) times daily. 60 tablet 1  . XARELTO 20 MG TABS tablet TAKE 1 TABLET(20 MG) BY MOUTH DAILY WITH DINNER 30 tablet 1   No current facility-administered medications for this visit.      Past Medical History:  Diagnosis Date  . Alcohol use with alcohol-induced mood disorder (HCC)    quit almost all ETOH in 07/2014, previously drank 18 to 20 beers daily.   . Arthritis   . Colon polyps 2011   Hyperplastic and 1 tubular adenoma.  Dr Loreta Ave  . 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 identified on TEE at time of stroke  . Gout   . Hyperlipidemia   . Hypertension   . Paroxysmal atrial fibrillation (HCC)    a. identified on LINQ 08/2014  . Stroke (HCC) 07/25/14   a. s/p MDT ILR implant.  on xarelto    ROS:   All systems reviewed and negative  except as noted in the HPI.   Past Surgical History:  Procedure Laterality Date  . COLONOSCOPY  2011   Dr Loreta Ave.   . EP IMPLANTABLE DEVICE N/A 07/27/2014   Procedure: Loop Recorder Insertion;  Surgeon: Marinus Maw, MD;  Location: [facility];  Service: Cardiovascular;  Laterality: N/A;  . EP IMPLANTABLE DEVICE N/A 12/20/2014   Procedure: Loop Recorder Removal;  Surgeon: Marinus Maw, MD;  Location: [facility];  Service: Cardiovascular;  Laterality: N/A;  . ESOPHAGOGASTRODUODENOSCOPY N/A 03/21/2015   Procedure: ESOPHAGOGASTRODUODENOSCOPY (EGD);  Surgeon: Jeani Hawking, MD;  Location[facility] ENDOSCOPY;  Service: Endoscopy;  Laterality: N/A;  . LOOP RECORDER IMPLANT  07/27/14   LOOP REVEAL LINQ ZOX09 - [id number]  . TEE WITHOUT CARDIOVERSION N/A 07/27/2014   Procedure: TRANSESOPHAGEAL ECHOCARDIOGRAM (TEE);  Surgeon: Chrystie Nose, MD;  Location: [facility] ENDOSCOPY;  Service: Cardiovascular;  Laterality: N/A;     Family History  Problem Relation Age of Onset  . Cancer Father     leukemia  . Heart attack Mother   . Heart disease Brother   . Heart attack Brother 25    CABG, stenting  . Hypertension Brother   .  Alcohol abuse Brother      Social History   Social History  . Marital status: Married    Spouse name: Marylu Lund  . Number of children: 3  . Years of education: 9   Occupational History  . former meat cutter     disabled due to CVA   Social History Main Topics  . Smoking status: Current Every Day Smoker    Packs/day: 0.50    Years: 40.00    Types: Cigarettes  . Smokeless tobacco: Never Used     Comment: cut back after stroke, but increased again due to depression  . Alcohol use 0.0 - 1.2 oz/week     Comment: <12/week  . Drug use: Yes    Types: Marijuana     Comment: trying to help his appetite  . Sexual activity: Yes    Partners: Female   Other Topics Concern  . Not on file   Social History Narrative   Lives at home with wife, Marylu Lund   Caffeine use - tea  4-5 glasses a day     BP 124/70   Pulse 62   Ht  (1.702 m)   Wt 174 lb 2 oz (79 kg)   SpO2 98%   BMI 27.27 kg/m   Physical Exam:  Well appearing middle aged man, NAD HEENT: Unremarkable Neck:  6 cm JVD, no thyromegally Lymphatics:  No adenopathy Back:  No CVA tenderness Lungs:  Clear with no wheezes HEART:  Regular rate rhythm, no murmurs, no rubs, no clicks Abd:  soft, positive bowel sounds, no organomegally, no rebound, no guarding Ext:  2 plus pulses, no edema, no cyanosis, no clubbing Skin:  No rashes no nodules Neuro:  CN II through XII intact, motor grossly intact except for left hemiparesis and minimal expressive aphasia.  ECG - NSR with normal axis and intervals.   Assess/Plan: 1. PAF - he is maintaining NSR very nicely. He will continue his current meds. He does not have palpitations. 2. Coags - we discussed switching to warfarin due to the cost of xarelto. He wants to stay with xarelto for now.  3. Stroke - he has a residual left arm weakness. He will continue his current meds. 4. HTN - his blood pressure is reasonably well controlled. Will follow.  Leonia Reeves.D.

## 2016-06-08 NOTE — Patient Instructions (Signed)
Medication Instructions:  Your physician recommends that you continue on your current medications as directed. Please refer to the Current Medication list given to you today.   Labwork: Lipid profile today  Testing/Procedures: None   Follow-Up: Your physician wants you to follow-up in: 6 months with Dr Ladona Ridgel. ( October 2018). You will receive a reminder letter in the mail two months in advance. If you don't receive a letter, please call our office to schedule the follow-up appointment.        If you need a refill on your cardiac medications before your next appointment, please call your pharmacy.

## 2016-06-09 NOTE — Addendum Note (Signed)
Addended by: Etheleen Mayhew C on: 06/09/2016 01:23 PM   Modules accepted: Orders

## 2016-08-03 ENCOUNTER — Telehealth: Payer: Self-pay

## 2016-08-03 DIAGNOSIS — E785 Hyperlipidemia, unspecified: Secondary | ICD-10-CM

## 2016-08-03 DIAGNOSIS — Z79899 Other long term (current) drug therapy: Secondary | ICD-10-CM

## 2016-08-03 NOTE — Telephone Encounter (Signed)
Received incoming call from pt's wife, [place]et ([place] Informed recommendations from Dr. Ladona Ridgelaylor r/t recent lipid panel results. Note from Dr. Ladona Ridgelaylor - ask patient to confirm he is taking his statin as prescribed, and ask him to reduce ETOH, reduce fat intake, and exercise if able. Repeat fasting lipids in 6 months.   Marylu LundJanet explained pt is taking stain every evening, as prescribed, pt is not drinking alcohol that often, pt is trying to avoid greasy foods, pt and wife are playing golf. Informed pt needs fasting lipid panel (nothing to eat after midnight, only water or black coffee to drink) in 6 months. Pt would like to come on 01/25/17. Marylu LundJanet verbalized understanding.

## 2017-01-25 ENCOUNTER — Other Ambulatory Visit: Payer: PRIVATE HEALTH INSURANCE

## 2017-01-29 ENCOUNTER — Other Ambulatory Visit: Payer: PRIVATE HEALTH INSURANCE

## 2017-04-23 ENCOUNTER — Telehealth: Payer: Self-pay | Admitting: Physical Medicine & Rehabilitation

## 2017-04-23 NOTE — Telephone Encounter (Signed)
Wife called and wants to have rescheduled with [facility]-- advised need primary care referral since over 1 year since last visit.  She will contact dr Lin Givensjeffries

## 2017-05-12 ENCOUNTER — Ambulatory Visit: Payer: Medicare Other

## 2017-05-12 ENCOUNTER — Encounter: Payer: Self-pay | Admitting: Physician Assistant

## 2017-05-12 ENCOUNTER — Telehealth: Payer: Self-pay | Admitting: Internal Medicine

## 2017-05-12 ENCOUNTER — Other Ambulatory Visit: Payer: Self-pay

## 2017-05-12 ENCOUNTER — Ambulatory Visit (INDEPENDENT_AMBULATORY_CARE_PROVIDER_SITE_OTHER): Payer: Medicare Other

## 2017-05-12 ENCOUNTER — Ambulatory Visit (INDEPENDENT_AMBULATORY_CARE_PROVIDER_SITE_OTHER): Payer: Medicare Other | Admitting: Physician Assistant

## 2017-05-12 VITALS — BP 122/64 | HR 87 | Temp 97.8°F | Resp 16 | Ht 67.0 in | Wt 160.4 lb

## 2017-05-12 DIAGNOSIS — M25512 Pain in left shoulder: Secondary | ICD-10-CM

## 2017-05-12 DIAGNOSIS — E785 Hyperlipidemia, unspecified: Secondary | ICD-10-CM | POA: Diagnosis not present

## 2017-05-12 DIAGNOSIS — D72829 Elevated white blood cell count, unspecified: Secondary | ICD-10-CM | POA: Diagnosis not present

## 2017-05-12 DIAGNOSIS — Z0000 Encounter for general adult medical examination without abnormal findings: Secondary | ICD-10-CM | POA: Diagnosis not present

## 2017-05-12 DIAGNOSIS — I480 Paroxysmal atrial fibrillation: Secondary | ICD-10-CM

## 2017-05-12 DIAGNOSIS — I10 Essential (primary) hypertension: Secondary | ICD-10-CM

## 2017-05-12 DIAGNOSIS — Z23 Encounter for immunization: Secondary | ICD-10-CM | POA: Diagnosis not present

## 2017-05-12 DIAGNOSIS — Z125 Encounter for screening for malignant neoplasm of prostate: Secondary | ICD-10-CM | POA: Diagnosis not present

## 2017-05-12 DIAGNOSIS — G8929 Other chronic pain: Secondary | ICD-10-CM

## 2017-05-12 DIAGNOSIS — F17200 Nicotine dependence, unspecified, uncomplicated: Secondary | ICD-10-CM

## 2017-05-12 DIAGNOSIS — M1A9XX0 Chronic gout, unspecified, without tophus (tophi): Secondary | ICD-10-CM

## 2017-05-12 DIAGNOSIS — R414 Neurologic neglect syndrome: Secondary | ICD-10-CM

## 2017-05-12 DIAGNOSIS — I69398 Other sequelae of cerebral infarction: Secondary | ICD-10-CM | POA: Diagnosis not present

## 2017-05-12 DIAGNOSIS — F0631 Mood disorder due to known physiological condition with depressive features: Secondary | ICD-10-CM

## 2017-05-12 DIAGNOSIS — R739 Hyperglycemia, unspecified: Secondary | ICD-10-CM

## 2017-05-12 DIAGNOSIS — I69359 Hemiplegia and hemiparesis following cerebral infarction affecting unspecified side: Secondary | ICD-10-CM | POA: Diagnosis not present

## 2017-05-12 DIAGNOSIS — R351 Nocturia: Secondary | ICD-10-CM

## 2017-05-12 DIAGNOSIS — M109 Gout, unspecified: Secondary | ICD-10-CM | POA: Insufficient documentation

## 2017-05-12 DIAGNOSIS — E782 Mixed hyperlipidemia: Secondary | ICD-10-CM

## 2017-05-12 MED ORDER — LISINOPRIL 5 MG PO TABS: 5.0000 mg | ORAL_TABLET | Freq: Every day | ORAL | 3 refills | Status: DC

## 2017-05-12 MED ORDER — TRAMADOL HCL 50 MG PO TABS: 50.0000 mg | ORAL_TABLET | Freq: Two times a day (BID) | ORAL | 1 refills | Status: DC

## 2017-05-12 MED ORDER — ATORVASTATIN CALCIUM 80 MG PO TABS: ORAL_TABLET | ORAL | 3 refills | Status: DC

## 2017-05-12 MED ORDER — RIVAROXABAN 20 MG PO TABS
ORAL_TABLET | ORAL | 3 refills | Status: DC
Start: 2017-05-12 — End: 2021-05-19

## 2017-05-12 MED ORDER — GABAPENTIN 300 MG PO CAPS: 300.0000 mg | ORAL_CAPSULE | Freq: Three times a day (TID) | ORAL | 1 refills | Status: DC

## 2017-05-12 MED ORDER — VARENICLINE TARTRATE 1 MG PO TABS: 1.0000 mg | ORAL_TABLET | Freq: Two times a day (BID) | ORAL | 1 refills | Status: DC

## 2017-05-12 MED ORDER — TRAMADOL HCL 50 MG PO TABS: 50.0000 mg | ORAL_TABLET | Freq: Two times a day (BID) | ORAL | 0 refills | Status: DC | PRN

## 2017-05-12 MED ORDER — BUPROPION HCL ER (XL) 150 MG PO TB24: 150.0000 mg | ORAL_TABLET | Freq: Every day | ORAL | 1 refills | Status: DC

## 2017-05-12 MED ORDER — COLCHICINE 0.6 MG PO TABS: 0.6000 mg | ORAL_TABLET | Freq: Two times a day (BID) | ORAL | 1 refills | Status: DC | PRN

## 2017-05-12 NOTE — Patient Instructions (Addendum)
1. Schedule follow-up with Dr. Ladona Ridgelaylor and Dr. Wynn BankerKirsteins 2. Take an aspirin daily until you get back on the Xarelto   IF you received an x-ray today, you will receive an invoice from [facility]. Please contact [facility] at [phone] with questions or concerns regarding your invoice.   IF you received labwork today, you will receive an invoice from [facility]. Please contact [facility] at [phone] with questions or concerns regarding your invoice.   Our billing staff will not be able to assist you with questions regarding bills from these companies.  You will be contacted with the lab results as soon as they are available. The fastest way to get your results is to activate your My Chart account. Instructions are located on the last page of this paperwork. If you have not heard from us regarding the results in 2 weeks, please contact this office.     CHANTIX Instructions: Select a QUIT date at least 7 days in the future. You should be taking the Chantix for at least 7 days before you stop smoking. Days 1-4: take 1/2 tablet (0.5 mg) each evening Days 5-8: take 1/2 tablet (0.5 mg) each morning and each evening Days 9-12: take 1/2 tablet (0.5 mg) each morning and 1 tablet (1 mg) each evening Days 13-on: Take 1 tablet (1 mg) each morning and each evening

## 2017-05-12 NOTE — Progress Notes (Signed)
Patient ID: [name] SpeckingJerry [name], male    DOB: Aug 30, 1955, 62 y.o.   MRN: [id number]  PCP: Porfirio OarJeffery, Kayline Sheer, PA-C  Chief Complaint  Patient presents with  . Annual Exam    Subjective:   Presents for [name] Wellness Visit. He is accompanied by his wife.  I last saw him 12/10/2015 for hyperlipidemia He had follow-up with cardiology 05/2016 for PSVT. He remained in NSR at that time and continued on Xarelto.  He lost his health insurance, and has just recently obtained Medicare, so is able to return. He has been out of medications for the past 6 months. Took some aspirin occasionally when he was out of the Xarelto.  Continues to have LEFT shoulder pain, ever since the stroke in 2016. Received several steroid injections with Dr. Wynn BankerKirsteins during rehabilitation from CVA.  Depression persists since stroke. 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 He believes that he can perform the job safely. He has started to drive a little.  Colorectal Cancer Screening: 07/09/2009 Prostate Cancer Screening: last tested 2015, 0.55 Bone Density Testing: not yet a candidate HIV Screening: 12/10/2015, very low risk STI Screening: very low risk Seasonal Influenza Vaccination: 05/12/17 Td/Tdap Vaccination: 12/10/15 Pneumococcal Vaccination: not yet a candidate Zoster Vaccination: not yet   Review of Systems  Constitutional: Negative.   HENT: Positive for trouble swallowing. Negative for congestion, dental problem, drooling, ear discharge, ear pain, facial swelling, hearing loss, mouth sores, nosebleeds, postnasal drip, rhinorrhea, sinus pressure, sinus pain, sneezing, sore throat, tinnitus and voice change.   Eyes: Negative.   Respiratory: Negative.   Cardiovascular: Negative.   Gastrointestinal: Negative.   Endocrine: Negative.   Genitourinary: Negative.   Musculoskeletal: Negative.   Skin: Negative.   Allergic/Immunologic: Negative.   Neurological: Positive for  weakness (and spasticity of the LEFT arm/hand).  Hematological: Negative.   Psychiatric/Behavioral: Positive for dysphoric mood. Negative for agitation, behavioral problems, confusion, decreased concentration, hallucinations, self-injury, sleep disturbance and suicidal ideas. The patient is not nervous/anxious and is not hyperactive.    Depression screen [facility] 2/9 05/12/2017 12/10/2015 08/06/2015 06/13/2015 02/26/2015  Decreased Interest 2 0 0 1 1  Down, Depressed, Hopeless 3 0 0 1 1  PHQ - 2 Score 5 0 0 2 2  Altered sleeping 0 - - - -  Tired, decreased energy 3 - - - -  Change in appetite 2 - - - -  Feeling bad or failure about yourself  3 - - - -  Trouble concentrating 0 - - - -  Moving slowly or fidgety/restless 1 - - - -  Suicidal thoughts 3 - - - -  PHQ-9 Score 17 - - - -  Difficult doing work/chores Very difficult - - - -      Patient Active Problem List   Diagnosis Date Noted  . Gout 05/12/2017  . Chronic left shoulder pain 06/13/2015  . Neuropathic pain of shoulder 06/13/2015  . Acromioclavicular joint arthritis 03/26/2015  . PAF (paroxysmal atrial fibrillation) (HCC) 03/20/2015  . Leukocytosis 03/20/2015  . Hematemesis 03/19/2015  . Depression due to old stroke 02/26/2015  . Hemiparesis and alteration of sensations as late effects of stroke (HCC) 10/29/2014  . Hypersomnia with sleep apnea 10/29/2014  . Nicotine use disorder 10/29/2014  . Left anterior shoulder pain 10/23/2014  . Atrial fibrillation (HCC) 09/06/2014  . Left spastic hemiparesis (HCC) 08/07/2014  . Left-sided neglect 08/07/2014  . History of stroke 08/01/2014  . Tobacco dependence   .  Carotid artery occlusion with infarction (HCC)   . History of ETOH abuse   . Hyperlipidemia   . Essential hypertension, benign 10/11/2013    Past Medical History:  Diagnosis Date  . AKI (acute kidney injury) (HCC) 03/20/2015  . Alcohol use with alcohol-induced mood disorder (HCC)    quit almost all ETOH in 07/2014,  previously drank 18 to 20 beers daily.   . Alterations of sensations following CVA (cerebrovascular accident) 09/10/2014  . Arthritis   . Colon polyps 2011   Hyperplastic and 1 tubular adenoma.  Dr [name]  . 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 identified on TEE at time of stroke  . Embolic stroke involving right middle cerebral artery (HCC) 10/29/2014  . Gout   . Hyperlipidemia   . Hypertension   . Paroxysmal atrial fibrillation (HCC)    a. identified on LINQ 08/2014  . Stroke (HCC) 07/25/14   a. s/p MDT ILR implant.  on xarelto     Prior to Admission medications   Medication Sig Start Date End Date Taking? Authorizing Provider  atorvastatin (LIPITOR) 80 MG tablet TAKE 1 TABLET BY MOUTH  DAILY AT 6 PM 09/16/15  Yes Walburga Hudman, PA-C  BuPROPion HCl ER, XL, 450 MG TB24 Take 1 tablet by mouth daily. 05/17/15  Yes Ethelda Chick, MD  colchicine 0.6 MG tablet Take 1 tablet (0.6 mg total) by mouth 2 (two) times daily as needed (gout). 03/25/15  Yes Jhonatan Lomeli, PA-C  gabapentin (NEURONTIN) 300 MG capsule Take 1 capsule (300 mg total) by mouth 3 (three) times daily. 05/21/15  Yes Kirsteins, Victorino Sparrow, MD  lisinopril (PRINIVIL,ZESTRIL) 5 MG tablet Take 1 tablet (5 mg total) by mouth daily. 10/17/14  Yes Melinda Gwinner, PA-C  traMADol (ULTRAM) 50 MG tablet Take 1 tablet (50 mg total) by mouth 2 (two) times daily. 05/21/15  Yes Kirsteins, Victorino Sparrow, MD  XARELTO 20 MG TABS tablet TAKE 1 TABLET(20 MG) BY MOUTH DAILY WITH DINNER 10/07/15  Yes Glory Buff, Roselyn Bering, NP  rivaroxaban (XARELTO) 20 MG TABS tablet Take 1 tablet (20 mg total) by mouth daily with supper. Patient not taking: Reported on 05/12/2017 10/07/15   Marinus Maw, MD    No Known Allergies  Past Surgical History:  Procedure Laterality Date  . COLONOSCOPY  2011   Dr [name].   . EP IMPLANTABLE DEVICE N/A 07/27/2014   Procedure: Loop Recorder Insertion;  Surgeon: Marinus Maw, MD;  Location: [facility] INVASIVE CV LAB;  Service: Cardiovascular;  Laterality:  N/A;  . EP IMPLANTABLE DEVICE N/A 12/20/2014   Procedure: Loop Recorder Removal;  Surgeon: Marinus Maw, MD;  Location: [facility] INVASIVE CV LAB;  Service: Cardiovascular;  Laterality: N/A;  . ESOPHAGOGASTRODUODENOSCOPY N/A 03/21/2015   Procedure: ESOPHAGOGASTRODUODENOSCOPY (EGD);  Surgeon: Jeani Hawking, MD;  Location: [facility] ENDOSCOPY;  Service: Endoscopy;  Laterality: N/A;  . LOOP RECORDER IMPLANT  07/27/14   LOOP REVEAL LINQ [id number] - [id number]  . TEE WITHOUT CARDIOVERSION N/A 07/27/2014   Procedure: TRANSESOPHAGEAL ECHOCARDIOGRAM (TEE);  Surgeon: Chrystie Nose, MD;  Location: [facility] ENDOSCOPY;  Service: Cardiovascular;  Laterality: N/A;    Family History  Problem Relation Age of Onset  . Cancer Father        leukemia  . Heart attack Mother   . Heart disease Brother   . Heart attack Brother 49       CABG, stenting  . Hypertension Brother   . Alcohol abuse Brother     Social History   Socioeconomic  History  . Marital status: Married    Spouse name: Marylu Lund  . Number of children: 3  . Years of education: 9  . Highest education level: Not on file  Occupational History  . Occupation: former Merchandiser, retail    Comment: disabled due to CVA  Social Needs  . Financial resource strain: Not on file  . Food insecurity:    Worry: Not on file    Inability: Not on file  . Transportation needs:    Medical: Not on file    Non-medical: Not on file  Tobacco Use  . Smoking status: Current Every Day Smoker    Packs/day: 0.50    Years: 40.00    Pack years: 20.00    Types: Cigarettes  . Smokeless tobacco: Never Used  . Tobacco comment: cut back after stroke, but increased again due to depression  Substance and Sexual Activity  . Alcohol use: Yes    Alcohol/week: 0.0 - 1.2 oz    Comment: <12/week  . Drug use: Yes    Types: Marijuana    Comment: trying to help his appetite  . Sexual activity: Yes    Partners: Female  Lifestyle  . Physical activity:    Days per week: 0 days    Minutes per session: 0  min  . Stress: Not on file  Relationships  . Social connections:    Talks on phone: Not on file    Gets together: Not on file    Attends religious service: Not on file    Active member of club or organization: Not on file    Attends meetings of clubs or organizations: Not on file    Relationship status: Not on file  Other Topics Concern  . Not on file  Social History Narrative   Lives at home with wife, Marylu Lund   Caffeine use - tea 4-5 glasses a day        Objective:  Physical Exam  Constitutional: He is oriented to person, place, and time. He appears well-developed and well-nourished. He is active and cooperative. No distress.  BP 122/64   Pulse 87   Temp 97.8 F (36.6 C)   Resp 16   Ht 5\' 7"  (1.702 m)   Wt 160 lb 6.4 oz (72.8 kg)   SpO2 98%   BMI 25.12 kg/m   HENT:  Head: Normocephalic and atraumatic.  Right Ear: Hearing normal.  Left Ear: Hearing normal.  Eyes: Conjunctivae are normal. No scleral icterus.  Neck: Normal range of motion. Neck supple. No thyromegaly present.  Cardiovascular: Normal rate, regular rhythm and normal heart sounds.  Pulses:      Radial pulses are 2+ on the right side, and 2+ on the left side.  Pulmonary/Chest: Effort normal and breath sounds normal.  Musculoskeletal:  LEFT shoulder and arm weakness and spasticity.  Lymphadenopathy:       Head (right side): No tonsillar, no preauricular, no posterior auricular and no occipital adenopathy present.       Head (left side): No tonsillar, no preauricular, no posterior auricular and no occipital adenopathy present.    He has no cervical adenopathy.       Right: No supraclavicular adenopathy present.       Left: No supraclavicular adenopathy present.  Neurological: He is alert and oriented to person, place, and time. No sensory deficit.  LEFT arms/shoulder/hand weakness and spasticity, with neglect.  Skin: Skin is warm, dry and intact. No rash noted. No cyanosis or erythema. Nails show  no  clubbing.  Psychiatric: He has a normal mood and affect. His speech is normal and behavior is normal.    Wt Readings from Last 3 Encounters:  05/12/17 160 lb 6.4 oz (72.8 kg)  06/08/16 174 lb 2 oz (79 kg)  12/10/15 167 lb 9.6 oz (76 kg)     Visual Acuity Screening   Right eye Left eye Both eyes  Without correction: 20/50 20/40 20/25   With correction:            Assessment & Plan:   Problem List Items Addressed This Visit    Essential hypertension, benign    Controlled. Resume lisinopril 5 mg daily.      Relevant Medications   rivaroxaban (XARELTO) 20 MG TABS tablet   atorvastatin (LIPITOR) 80 MG tablet   lisinopril (PRINIVIL,ZESTRIL) 5 MG tablet   Other Relevant Orders   CBC with Differential/Platelet (Completed)   Comprehensive metabolic panel (Completed)   TSH (Completed)   Urinalysis, dipstick only (Completed)   Hyperlipidemia    Resume atorvastatin.      Relevant Medications   rivaroxaban (XARELTO) 20 MG TABS tablet   atorvastatin (LIPITOR) 80 MG tablet   lisinopril (PRINIVIL,ZESTRIL) 5 MG tablet   Other Relevant Orders   Comprehensive metabolic panel (Completed)   Lipid panel (Completed)   Tobacco dependence    Smoking cessation encouraged.      Relevant Medications   varenicline (CHANTIX CONTINUING MONTH PAK) 1 MG tablet   Left-sided neglect    Due to stroke. Follow-up with rehab medicine.      Hemiparesis and alteration of sensations as late effects of stroke (HCC)    Resume gabapentin. COnsider addition of tizanidine.      Relevant Medications   gabapentin (NEURONTIN) 300 MG capsule   Depression due to old stroke    Resume bupropion XL 150 mg. Previously took 450 mg daily. May need ramp up.      Relevant Medications   buPROPion (WELLBUTRIN XL) 150 MG 24 hr tablet   PAF (paroxysmal atrial fibrillation) (HCC)    NSR today. Resume Xarelto. Take daily ASA until mail-order supply arrives. Follow-up with cardiology.      Relevant Medications    rivaroxaban (XARELTO) 20 MG TABS tablet   atorvastatin (LIPITOR) 80 MG tablet   lisinopril (PRINIVIL,ZESTRIL) 5 MG tablet   Leukocytosis    Update CBC.      Relevant Orders   CBC with Differential/Platelet (Completed)   Chronic left shoulder pain    Resume gabapentin and tramadol. Consider the addition of tizanidine. Await radiographs. May need orthopedics referral.      Relevant Medications   gabapentin (NEURONTIN) 300 MG capsule   buPROPion (WELLBUTRIN XL) 150 MG 24 hr tablet   traMADol (ULTRAM) 50 MG tablet   Other Relevant Orders   DG Shoulder Left (Completed)   Gout    Stable.      Relevant Medications   colchicine 0.6 MG tablet    Other Visit Diagnoses    Annual physical exam    -  Primary   Age appropriate health guidance provided   Need for influenza vaccination       Relevant Orders   Flu Vaccine QUAD 6+ mos PF IM (Fluarix Quad PF)   Screening for prostate cancer       Relevant Orders   PSA (Completed)   Nocturia       Relevant Orders   PSA (Completed)   Urine Microscopic   Urine Culture   Hemoglobin  A1c (Completed)   Urine Microscopic (Completed)   Urine Culture (Completed)       Return in about 1 month (around 06/09/2017) for re-evalaution of mood.   Fernande Bras, PA-C [facility]

## 2017-05-13 LAB — URINALYSIS, DIPSTICK ONLY
BILIRUBIN UA: NEGATIVE
Glucose, UA: NEGATIVE
Ketones, UA: NEGATIVE
Nitrite, UA: NEGATIVE
PH UA: 5.5 (ref 5.0–7.5)
Specific Gravity, UA: 1.027 (ref 1.005–1.030)
Urobilinogen, Ur: 1 mg/dL (ref 0.2–1.0)

## 2017-05-13 LAB — URINALYSIS, MICROSCOPIC ONLY
Casts: NONE SEEN /lpf
Epithelial Cells (non renal): NONE SEEN /hpf (ref 0–10)

## 2017-05-13 LAB — COMPREHENSIVE METABOLIC PANEL
ALT: 15 IU/L (ref 0–44)
AST: 19 IU/L (ref 0–40)
Albumin/Globulin Ratio: 1.3 (ref 1.2–2.2)
Albumin: 4.5 g/dL (ref 3.6–4.8)
Alkaline Phosphatase: 85 IU/L (ref 39–117)
BUN/Creatinine Ratio: 11 (ref 10–24)
BUN: 9 mg/dL (ref 8–27)
Bilirubin Total: 0.6 mg/dL (ref 0.0–1.2)
CO2: 19 mmol/L — ABNORMAL LOW (ref 20–29)
Calcium: 9.8 mg/dL (ref 8.6–10.2)
Chloride: 102 mmol/L (ref 96–106)
Creatinine, Ser: 0.8 mg/dL (ref 0.76–1.27)
GFR calc Af Amer: 111 mL/min/{1.73_m2} (ref >59)
GFR calc non Af Amer: 96 mL/min/{1.73_m2} (ref >59)
Globulin, Total: 3.4 g/dL (ref 1.5–4.5)
Glucose: 101 mg/dL — ABNORMAL HIGH (ref 65–99)
Potassium: 4.3 mmol/L (ref 3.5–5.2)
Sodium: 139 mmol/L (ref 134–144)
Total Protein: 7.9 g/dL (ref 6.0–8.5)

## 2017-05-13 LAB — CBC WITH DIFFERENTIAL/PLATELET
BASOS ABS: 0.1 10*3/uL (ref 0.0–0.2)
Basos: 1 %
EOS (ABSOLUTE): 0.2 10*3/uL (ref 0.0–0.4)
Eos: 3 %
Hematocrit: 47.4 % (ref 37.5–51.0)
Hemoglobin: 16.7 g/dL (ref 13.0–17.7)
IMMATURE GRANULOCYTES: 0 %
Immature Grans (Abs): 0 10*3/uL (ref 0.0–0.1)
Lymphocytes Absolute: 2.6 10*3/uL (ref 0.7–3.1)
Lymphs: 32 %
MCH: 31.7 pg (ref 26.6–33.0)
MCHC: 35.2 g/dL (ref 31.5–35.7)
MCV: 90 fL (ref 79–97)
MONOS ABS: 0.7 10*3/uL (ref 0.1–0.9)
Monocytes: 8 %
Neutrophils Absolute: 4.6 10*3/uL (ref 1.4–7.0)
Neutrophils: 56 %
PLATELETS: 345 10*3/uL (ref 150–379)
RBC: 5.27 x10E6/uL (ref 4.14–5.80)
RDW: 13.5 % (ref 12.3–15.4)
WBC: 8.1 10*3/uL (ref 3.4–10.8)

## 2017-05-13 LAB — URINE CULTURE: ORGANISM ID, BACTERIA: NO GROWTH

## 2017-05-13 LAB — LIPID PANEL
Chol/HDL Ratio: 9.7 ratio — ABNORMAL HIGH (ref 0.0–5.0)
Cholesterol, Total: 309 mg/dL — ABNORMAL HIGH (ref 100–199)
HDL: 32 mg/dL — ABNORMAL LOW (ref >39)
LDL Calculated: 248 mg/dL — ABNORMAL HIGH (ref 0–99)
Triglycerides: 145 mg/dL (ref 0–149)
VLDL Cholesterol Cal: 29 mg/dL (ref 5–40)

## 2017-05-13 LAB — HEMOGLOBIN A1C
ESTIMATED AVERAGE GLUCOSE: 114 mg/dL
HEMOGLOBIN A1C: 5.6 % (ref 4.8–5.6)

## 2017-05-13 LAB — TSH: TSH: 1.29 u[IU]/mL (ref 0.450–4.500)

## 2017-05-13 LAB — PSA: PROSTATE SPECIFIC AG, SERUM: 1.3 ng/mL (ref 0.0–4.0)

## 2017-05-15 ENCOUNTER — Encounter: Payer: Self-pay | Admitting: Physician Assistant

## 2017-05-15 NOTE — Assessment & Plan Note (Signed)
Stable

## 2017-05-15 NOTE — Assessment & Plan Note (Signed)
Controlled. Resume lisinopril 5 mg daily.

## 2017-05-15 NOTE — Assessment & Plan Note (Signed)
Resume atorvastatin. 

## 2017-05-15 NOTE — Assessment & Plan Note (Addendum)
NSR today. Resume Xarelto. Take daily ASA until mail-order supply arrives. Follow-up with cardiology.

## 2017-05-15 NOTE — Assessment & Plan Note (Signed)
Resume gabapentin. COnsider addition of tizanidine.

## 2017-05-15 NOTE — Assessment & Plan Note (Signed)
Resume gabapentin and tramadol. Consider the addition of tizanidine. Await radiographs. May need orthopedics referral.

## 2017-05-15 NOTE — Assessment & Plan Note (Signed)
Update CBC. 

## 2017-05-15 NOTE — Assessment & Plan Note (Signed)
Due to stroke. Follow-up with rehab medicine.

## 2017-05-15 NOTE — Assessment & Plan Note (Signed)
Resume bupropion XL 150 mg. Previously took 450 mg daily. May need ramp up.

## 2017-05-15 NOTE — Assessment & Plan Note (Signed)
Smoking cessation encouraged!

## 2017-05-21 ENCOUNTER — Other Ambulatory Visit: Payer: Self-pay | Admitting: Physician Assistant

## 2017-05-21 ENCOUNTER — Telehealth: Payer: Self-pay | Admitting: Physician Assistant

## 2017-05-21 DIAGNOSIS — R319 Hematuria, unspecified: Secondary | ICD-10-CM

## 2017-05-21 NOTE — Telephone Encounter (Signed)
Result note routed to lab pool. 

## 2017-05-21 NOTE — Telephone Encounter (Signed)
Please advise on labs.

## 2017-05-21 NOTE — Telephone Encounter (Signed)
Pt given shoulder x-ray results per notes of Porfirio OarChelle Jeffery, PA-Con 05/13/17, patient verbalized understanding and says he is agreeable to see a orthopedics specialist. Patient asked about lab results, advised they are not released by the provider and he will receive a call as soon as they are. Unable to document in result note due to result note not being routed to [facility].

## 2017-05-24 ENCOUNTER — Encounter: Payer: Self-pay | Admitting: Physician Assistant

## 2017-05-27 ENCOUNTER — Encounter: Payer: Self-pay | Admitting: *Deleted

## 2017-06-14 ENCOUNTER — Encounter: Payer: Self-pay | Admitting: Internal Medicine

## 2017-06-14 ENCOUNTER — Other Ambulatory Visit: Payer: Medicare (Managed Care) | Admitting: *Deleted

## 2017-06-14 ENCOUNTER — Ambulatory Visit (INDEPENDENT_AMBULATORY_CARE_PROVIDER_SITE_OTHER): Payer: Self-pay | Admitting: Internal Medicine

## 2017-06-14 VITALS — BP 112/68 | HR 52 | Ht 66.0 in | Wt 154.4 lb

## 2017-06-14 DIAGNOSIS — I10 Essential (primary) hypertension: Secondary | ICD-10-CM | POA: Diagnosis not present

## 2017-06-14 DIAGNOSIS — Z79899 Other long term (current) drug therapy: Secondary | ICD-10-CM

## 2017-06-14 DIAGNOSIS — I480 Paroxysmal atrial fibrillation: Secondary | ICD-10-CM

## 2017-06-14 DIAGNOSIS — E785 Hyperlipidemia, unspecified: Secondary | ICD-10-CM

## 2017-06-14 LAB — LIPID PANEL
Chol/HDL Ratio: 8.8 ratio — ABNORMAL HIGH (ref 0.0–5.0)
Cholesterol, Total: 254 mg/dL — ABNORMAL HIGH (ref 100–199)
HDL: 29 mg/dL — AB (ref >39)
LDL Calculated: 206 mg/dL — ABNORMAL HIGH (ref 0–99)
Triglycerides: 97 mg/dL (ref 0–149)
VLDL Cholesterol Cal: 19 mg/dL (ref 5–40)

## 2017-06-14 NOTE — Progress Notes (Signed)
HPI [name] returns today for evaluation of PAF, remote cryptogenic stroke, dyslipidemia, and HTN. He admits to some non-compliance. He is still smoking though he stopped drinking ETOH. The patient denies syncope. His ILR was removed over a year ago.  No Known Allergies   Current Outpatient Medications  Medication Sig Dispense Refill  . atorvastatin (LIPITOR) 80 MG tablet TAKE 1 TABLET BY MOUTH  DAILY AT 6 PM 90 tablet 3  . buPROPion (WELLBUTRIN XL) 150 MG 24 hr tablet Take 1 tablet (150 mg total) by mouth daily. 90 tablet 1  . colchicine 0.6 MG tablet Take 1 tablet (0.6 mg total) by mouth 2 (two) times daily as needed (gout). 20 tablet 1  . gabapentin (NEURONTIN) 300 MG capsule Take 1 capsule (300 mg total) by mouth 3 (three) times daily. 270 capsule 1  . lisinopril (PRINIVIL,ZESTRIL) 5 MG tablet Take 1 tablet (5 mg total) by mouth daily. 90 tablet 3  . rivaroxaban (XARELTO) 20 MG TABS tablet TAKE 1 TABLET(20 MG) BY MOUTH DAILY WITH DINNER 90 tablet 3  . traMADol (ULTRAM) 50 MG tablet Take 1 tablet (50 mg total) by mouth 2 (two) times daily as needed. 60 tablet 0  . varenicline (CHANTIX CONTINUING MONTH PAK) 1 MG tablet Take 1 tablet (1 mg total) by mouth 2 (two) times daily. 180 tablet 1   No current facility-administered medications for this visit.      Past Medical History:  Diagnosis Date  . AKI (acute kidney injury) (HCC) 03/20/2015  . Alcohol use with alcohol-induced mood disorder (HCC)    quit almost all ETOH in 07/2014, previously drank 18 to 20 beers daily.   . Alterations of sensations following CVA (cerebrovascular accident) 09/10/2014  . Arthritis   . Colon polyps 2011   Hyperplastic and 1 tubular adenoma.  Dr Loreta Ave  . 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 identified on TEE at time of stroke  . Embolic stroke involving right middle cerebral artery (HCC) 10/29/2014  . Gout   . Hyperlipidemia   . Hypertension   . Paroxysmal atrial fibrillation (HCC)    a. identified on LINQ  08/2014  . Stroke (HCC) 07/25/14   a. s/p MDT ILR implant.  on xarelto    ROS:   All systems reviewed and negative except as noted in the HPI.   Past Surgical History:  Procedure Laterality Date  . COLONOSCOPY  2011   Dr Loreta Ave.   . EP IMPLANTABLE DEVICE N/A 07/27/2014   Procedure: Loop Recorder Insertion;  Surgeon: Marinus Maw, MD;  Location: [facility];  Service: Cardiovascular;  Laterality: N/A;  . EP IMPLANTABLE DEVICE N/A 12/20/2014   Procedure: Loop Recorder Removal;  Surgeon: Marinus Maw, MD;  Location: [facility];  Service: Cardiovascular;  Laterality: N/A;  . ESOPHAGOGASTRODUODENOSCOPY N/A 03/21/2015   Procedure: ESOPHAGOGASTRODUODENOSCOPY (EGD);  Surgeon: Jeani Hawking, MD;  Location: [facility] ENDOSCOPY;  Service: Endoscopy;  Laterality: N/A;  . LOOP RECORDER IMPLANT  07/27/14   LOOP REVEAL LINQ ZOX09 - [id number]  . TEE WITHOUT CARDIOVERSION N/A 07/27/2014   Procedure: TRANSESOPHAGEAL ECHOCARDIOGRAM (TEE);  Surgeon: Chrystie Nose, MD;  Location: [facility] ENDOSCOPY;  Service: Cardiovascular;  Laterality: N/A;     Family History  Problem Relation Age of Onset  . Cancer Father        leukemia  . Heart attack Mother   . Heart disease Brother   . Heart attack Brother 61  CABG, stenting  . Hypertension Brother   . Alcohol abuse Brother      Social History   Socioeconomic History  . Marital status: Married    Spouse name: [name]  . Number of children: 3  . Years of education: 9  . Highest education level: Not on file  Occupational History  . Occupation: former Merchandiser, retail    Comment: disabled due to CVA  Social Needs  . Financial resource strain: Not on file  . Food insecurity:    Worry: Not on file    Inability: Not on file  . Transportation needs:    Medical: Not on file    Non-medical: Not on file  Tobacco Use  . Smoking status: Current Every Day Smoker    Packs/day: 0.50    Years: 40.00    Pack years: 20.00    Types: Cigarettes  . Smokeless  tobacco: Never Used  . Tobacco comment: cut back after stroke, but increased again due to depression  Substance and Sexual Activity  . Alcohol use: Yes    Alcohol/week: 0.0 - 1.2 oz    Comment: <12/week  . Drug use: Yes    Types: Marijuana    Comment: trying to help his appetite  . Sexual activity: Yes    Partners: Female  Lifestyle  . Physical activity:    Days per week: 0 days    Minutes per session: 0 min  . Stress: Not on file  Relationships  . Social connections:    Talks on phone: Not on file    Gets together: Not on file    Attends religious service: Not on file    Active member of club or organization: Not on file    Attends meetings of clubs or organizations: Not on file    Relationship status: Not on file  . Intimate partner violence:    Fear of current or ex partner: Not on file    Emotionally abused: Not on file    Physically abused: Not on file    Forced sexual activity: Not on file  Other Topics Concern  . Not on file  Social History Narrative   Lives at home with wife, [name]   Caffeine use - tea 4-5 glasses a day     BP 112/68   Pulse (!) 52   Ht  (1.676 m)   Wt 154 lb 6.4 oz (70 kg)   SpO2 99%   BMI 24.92 kg/m   Physical Exam:  Well appearing 62 yo man, NAD HEENT: Unremarkable Neck:  6 cm JVD, no thyromegally Lymphatics:  No adenopathy Back:  No CVA tenderness Lungs:  Clear with no wheezes HEART:  Regular rate rhythm, no murmurs, no rubs, no clicks Abd:  soft, positive bowel sounds, no organomegally, no rebound, no guarding Ext:  2 plus pulses, no edema, no cyanosis, no clubbing Skin:  No rashes no nodules Neuro:  CN II through XII intact, motor grossly intact  EKG - sinus brady with LAE and AS MI   Assess/Plan: 1. PAF - he has been asymptomatic. He will continue his xarelto. 2. Tobacco abuse - I have asked the patient to stop smoking. 3. HTN - his blood pressure is controlled. Continue 4. Dyslipidemia - his LDL is very high. He  admits to not taking his lipitor as directed. I will refer him to our lipid clinic. Repatha could be a consideration.  Leonia Reeves.D.

## 2017-06-14 NOTE — Patient Instructions (Addendum)
Medication Instructions:  Your physician recommends that you continue on your current medications as directed. Please refer to the Current Medication list given to you today.  Labwork: None ordered.  Testing/Procedures: None ordered.  Follow-Up:  Please schedule Pt to be seen by lipid clinic.   Your physician wants you to follow-up in: one year with Dr. Ladona Ridgel.   You will receive a reminder letter in the mail two months in advance. If you don't receive a letter, please call our office to schedule the follow-up appointment.   Any Other Special Instructions Will Be Listed Below (If Applicable).  Referral to lipid clinic  If you need a refill on your cardiac medications before your next appointment, please call your pharmacy.

## 2017-06-15 ENCOUNTER — Encounter: Payer: Self-pay | Admitting: Physician Assistant

## 2017-06-15 ENCOUNTER — Other Ambulatory Visit: Payer: Self-pay

## 2017-06-15 ENCOUNTER — Ambulatory Visit (INDEPENDENT_AMBULATORY_CARE_PROVIDER_SITE_OTHER): Payer: Medicare Other | Admitting: Physician Assistant

## 2017-06-15 VITALS — BP 114/68 | HR 55 | Temp 97.8°F | Resp 16 | Ht 66.0 in | Wt 153.0 lb

## 2017-06-15 DIAGNOSIS — F0631 Mood disorder due to known physiological condition with depressive features: Secondary | ICD-10-CM

## 2017-06-15 DIAGNOSIS — R414 Neurologic neglect syndrome: Secondary | ICD-10-CM

## 2017-06-15 DIAGNOSIS — G8114 Spastic hemiplegia affecting left nondominant side: Secondary | ICD-10-CM | POA: Diagnosis not present

## 2017-06-15 DIAGNOSIS — I69398 Other sequelae of cerebral infarction: Secondary | ICD-10-CM

## 2017-06-15 DIAGNOSIS — M25512 Pain in left shoulder: Secondary | ICD-10-CM | POA: Diagnosis not present

## 2017-06-15 DIAGNOSIS — F17200 Nicotine dependence, unspecified, uncomplicated: Secondary | ICD-10-CM

## 2017-06-15 DIAGNOSIS — Z8673 Personal history of transient ischemic attack (TIA), and cerebral infarction without residual deficits: Secondary | ICD-10-CM | POA: Diagnosis not present

## 2017-06-15 DIAGNOSIS — I10 Essential (primary) hypertension: Secondary | ICD-10-CM

## 2017-06-15 DIAGNOSIS — E7849 Other hyperlipidemia: Secondary | ICD-10-CM

## 2017-06-15 MED ORDER — SERTRALINE HCL 50 MG PO TABS: 50.0000 mg | ORAL_TABLET | Freq: Every day | ORAL | 3 refills | Status: DC

## 2017-06-15 NOTE — Patient Instructions (Addendum)
CHANTIX Instructions: Select a QUIT date at least 7 days in the future. You should be taking the Chantix for at least 7 days before you stop smoking. Days 1-4: take 1/2 tablet (0.5 mg) each evening Days 5-8: take 1/2 tablet (0.5 mg) each morning and each evening Days 9-12: take 1/2 tablet (0.5 mg) each morning and 1 tablet (1 mg) each evening Days 13-on: Take 1 tablet (1 mg) each morning and each evening   Sertraline: take 1/2 tablet daily for the first week, then one tablet daily.  Start working on your arm exercises. Do them EVERY DAY.  IF you received an x-ray today, you will receive an invoice from [facility]. Please contact [facility] at [phone] with questions or concerns regarding your invoice.   IF you received labwork today, you will receive an invoice from [place]. Please contact [facility] at [phone] with questions or concerns regarding your invoice.   Our billing staff will not be able to assist you with questions regarding bills from these companies.  You will be contacted with the lab results as soon as they are available. The fastest way to get your results is to activate your My Chart account. Instructions are located on the last page of this paperwork. If you have not heard from [place] regarding the results in 2 weeks, please contact this office.

## 2017-06-15 NOTE — Progress Notes (Signed)
Patient ID: [name], male    DOB: Oct 05, 1955, 62 y.o.   MRN: [id number]  PCP: Porfirio Oar, PA-C  Chief Complaint  Patient presents with  . Hyperlipidemia    one month follow up     Subjective:   Presents for evaluation of mood.  I saw him last month for AWV, not having seen him since 2017. He had lost health insurance for a time, but finally qualified for Medicare. In the interim, he ran out of and could not afford his medications. We resumed his medications, including bupropion XL for management of depression due to effects of CVA.  Mood is improved, but still very irritable.  He has had follow-up with cardiology this month, for paroxysmal AFib, lipids and HTN. Xarelto continued. BP was controlled. LDL is elevated, and he had been inconsistent with atorvastatin. He was referred to the lipid clinic, and Repatha may be an option for him. His appointment is later this week.  He was referred to urology for evaluation of hematuria. Not yet seen there.  Continues to smoke. Has not started Chantix. Doesn't really enjoy smoking anymore. Was able to give up EtOH easily, though notes he still drinks 6 beers over the weekend.  LEFT shoulder pain persists. Had steroid injections with Dr. Wynn Banker x 3 without benefit.. Radiographs here last month revealed degenerative changes with possible rotator cuff impingement.  Loves to walk. Wants to be able to walk a mile again.    Review of Systems  Constitutional: Negative for chills and fever.  Respiratory: Negative for cough and shortness of breath.   Cardiovascular: Negative for chest pain, palpitations and leg swelling.  Gastrointestinal: Negative for diarrhea, nausea and vomiting.  Endocrine: Negative for polydipsia.  Genitourinary: Negative for dysuria, frequency and urgency.  Musculoskeletal: Negative for myalgias.  Skin: Negative for rash.  Neurological: Positive for light-headedness (with rapid position changes).  Negative for dizziness and headaches.     Patient Active Problem List   Diagnosis Date Noted  . Gout 05/12/2017  . Chronic left shoulder pain 06/13/2015  . Neuropathic pain of shoulder 06/13/2015  . Acromioclavicular joint arthritis 03/26/2015  . PAF (paroxysmal atrial fibrillation) (HCC) 03/20/2015  . Leukocytosis 03/20/2015  . Hematemesis 03/19/2015  . Depression due to old stroke 02/26/2015  . Hemiparesis and alteration of sensations as late effects of stroke (HCC) 10/29/2014  . Hypersomnia with sleep apnea 10/29/2014  . Nicotine use disorder 10/29/2014  . Left anterior shoulder pain 10/23/2014  . Atrial fibrillation (HCC) 09/06/2014  . Left spastic hemiparesis (HCC) 08/07/2014  . Left-sided neglect 08/07/2014  . History of stroke 08/01/2014  . Tobacco dependence   . Carotid artery occlusion with infarction (HCC)   . History of ETOH abuse   . Hyperlipidemia   . Essential hypertension, benign 10/11/2013     Prior to Admission medications   Medication Sig Start Date End Date Taking? Authorizing Provider  atorvastatin (LIPITOR) 80 MG tablet TAKE 1 TABLET BY MOUTH  DAILY AT 6 PM 05/12/17  Yes Efraim Vanallen, PA-C  buPROPion (WELLBUTRIN XL) 150 MG 24 hr tablet Take 1 tablet (150 mg total) by mouth daily. 05/12/17  Yes Albertia Carvin, PA-C  colchicine 0.6 MG tablet Take 1 tablet (0.6 mg total) by mouth 2 (two) times daily as needed (gout). 05/12/17  Yes Jarl Sellitto, PA-C  gabapentin (NEURONTIN) 300 MG capsule Take 1 capsule (300 mg total) by mouth 3 (three) times daily. 05/12/17  Yes Malarie Tappen, PA-C  lisinopril (PRINIVIL,ZESTRIL) 5  MG tablet Take 1 tablet (5 mg total) by mouth daily. 05/12/17  Yes Shaquina Gillham, PA-C  rivaroxaban (XARELTO) 20 MG TABS tablet TAKE 1 TABLET(20 MG) BY MOUTH DAILY WITH DINNER 05/12/17  Yes Ilse Billman, PA-C  traMADol (ULTRAM) 50 MG tablet Take 1 tablet (50 mg total) by mouth 2 (two) times daily as needed. 05/12/17  Yes Lanique Gonzalo, PA-C    varenicline (CHANTIX CONTINUING MONTH PAK) 1 MG tablet Take 1 tablet (1 mg total) by mouth 2 (two) times daily. 05/12/17  Yes Jaylyn Booher, PA-C     No Known Allergies     Objective:  Physical Exam  Constitutional: He is oriented to person, place, and time. He appears well-developed and well-nourished. He is active and cooperative. No distress.  BP 114/68   Pulse (!) 55   Temp 97.8 F (36.6 C)   Resp 16   Ht  (1.676 m)   Wt 153 lb (69.4 kg)   SpO2 98%   BMI 24.69 kg/m   HENT:  Head: Normocephalic and atraumatic.  Right Ear: Hearing normal.  Left Ear: Hearing normal.  Eyes: Conjunctivae are normal. No scleral icterus.  Neck: Normal range of motion. Neck supple. No thyromegaly present.  Cardiovascular: Normal rate, regular rhythm and normal heart sounds.  Pulses:      Radial pulses are 2+ on the right side, and 2+ on the left side.  Pulmonary/Chest: Effort normal and breath sounds normal.  Lymphadenopathy:       Head (right side): No tonsillar, no preauricular, no posterior auricular and no occipital adenopathy present.       Head (left side): No tonsillar, no preauricular, no posterior auricular and no occipital adenopathy present.    He has no cervical adenopathy.       Right: No supraclavicular adenopathy present.       Left: No supraclavicular adenopathy present.  Neurological: He is alert and oriented to person, place, and time. No sensory deficit.  Neglects LEFT side.  Skin: Skin is warm, dry and intact. No rash noted. No cyanosis or erythema. Nails show no clubbing.  Psychiatric: His speech is normal and behavior is normal. Thought content normal. His mood appears not anxious. His affect is not angry, not blunt, not labile and not inappropriate. He is not actively hallucinating. He exhibits a depressed mood. He is attentive.       Assessment & Plan:   Problem List Items Addressed This Visit    Tobacco dependence    Smoking cessation encouraged.       Left-sided neglect   Left spastic hemiparesis (HCC)    Recommend that he do the HEP and follow-up with rehab as directed.      Left anterior shoulder pain   Relevant Orders   Ambulatory referral to Orthopedic Surgery   Hyperlipidemia    Per cardiology.      History of stroke   Essential hypertension, benign    Controlled.      Depression due to old stroke - Primary    Start sertraline.      Relevant Medications   sertraline (ZOLOFT) 50 MG tablet       Return in about 1 month (around 07/13/2017) for re-evalaution of mood.   Fernande Bras, PA-C [facility] at [facility]

## 2017-06-17 ENCOUNTER — Encounter: Payer: Self-pay | Admitting: Pharmacist

## 2017-06-17 ENCOUNTER — Ambulatory Visit (INDEPENDENT_AMBULATORY_CARE_PROVIDER_SITE_OTHER): Payer: Medicare Other | Admitting: Orthopaedic Surgery

## 2017-06-17 ENCOUNTER — Other Ambulatory Visit (INDEPENDENT_AMBULATORY_CARE_PROVIDER_SITE_OTHER): Payer: Self-pay | Admitting: Radiology

## 2017-06-17 ENCOUNTER — Ambulatory Visit (INDEPENDENT_AMBULATORY_CARE_PROVIDER_SITE_OTHER): Payer: Medicare (Managed Care) | Admitting: Pharmacist

## 2017-06-17 ENCOUNTER — Encounter (INDEPENDENT_AMBULATORY_CARE_PROVIDER_SITE_OTHER): Payer: Self-pay | Admitting: Orthopaedic Surgery

## 2017-06-17 VITALS — BP 109/65 | HR 64 | Resp 18 | Ht 66.0 in | Wt 153.0 lb

## 2017-06-17 DIAGNOSIS — G8929 Other chronic pain: Secondary | ICD-10-CM | POA: Diagnosis not present

## 2017-06-17 DIAGNOSIS — M25512 Pain in left shoulder: Secondary | ICD-10-CM

## 2017-06-17 DIAGNOSIS — E785 Hyperlipidemia, unspecified: Secondary | ICD-10-CM

## 2017-06-17 MED ORDER — LIDOCAINE HCL 1 % IJ SOLN
2.0000 mL | INTRAMUSCULAR | Status: AC | PRN
  Administered 2017-06-17: 2 mL

## 2017-06-17 MED ORDER — BUPIVACAINE HCL 0.5 % IJ SOLN
2.0000 mL | INTRAMUSCULAR | Status: AC | PRN
  Administered 2017-06-17: 2 mL via INTRA_ARTICULAR

## 2017-06-17 MED ORDER — METHYLPREDNISOLONE ACETATE 40 MG/ML IJ SUSP
80.0000 mg | INTRAMUSCULAR | Status: AC | PRN
Start: 2017-06-17 — End: 2017-06-17
  Administered 2017-06-17: 80 mg

## 2017-06-17 NOTE — Progress Notes (Addendum)
Patient ID: [name]                 DOB: [birth year]                    MRN: [id number]     HPI: [name] is a 62 y.o. male patient of Dr. Ladona Ridgel that presents today for lipid evaluation.  PMH includes PAF, remote cryptogenic stroke, dyslipidemia, and HTN. He admits to some non-compliance. He is still smoking though he stopped drinking ETOH. At his most recent visit with Dr. Ladona Ridgel noncompliance was discussed.   He presents today for discussion of cholesterol. He reports that on his last panel he has stopped atorvastatin completely due to cost. He states he never had any tolerability issues that he is aware of, but without insurance he was unable to afford any of his medications.   He asks about smoking weed with chantix. Previously quit cigarettes for 30 days (his longest quit attempt).   Risk Factors: HTN, cryptogenic stroke LDL Goal: <70  Current Medications: atorvastatin  daily  Intolerances: none  Diet: He eats meals prepared from home. He eats more chicken than anything. He does endorse fried chicken mostly. He has started eating cheerios. He does eat vegetables. He drinks iced tea mostly. He does endorse liking sweets.   Exercise: He does not really exercise. He is limited due hip pain.   Family History: father - cancer, mother - MI, Brother - heart disease, MI at 1, HTN, alcohol abuse  Social History: current smoker tries to smoke less than a 1/2 pack, no alcohol  Labs: 06/14/17: TC 254, TG 97, HDL 29, LDL 206 (noncompliant to atorvastatin)  Past Medical History:  Diagnosis Date  . AKI (acute kidney injury) (HCC) 03/20/2015  . Alcohol use with alcohol-induced mood disorder (HCC)    quit almost all ETOH in 07/2014, previously drank 18 to 20 beers daily.   . Alterations of sensations following CVA (cerebrovascular accident) 09/10/2014  . Arthritis   . Colon polyps 2011   Hyperplastic and 1 tubular adenoma.  Dr Loreta Ave  . 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 identified on TEE at  time of stroke  . Embolic stroke involving right middle cerebral artery (HCC) 10/29/2014  . Gout   . Hyperlipidemia   . Hypertension   . Paroxysmal atrial fibrillation (HCC)    a. identified on LINQ 08/2014  . Stroke (HCC) 07/25/14   a. s/p MDT ILR implant.  on xarelto    Current Outpatient Medications on File Prior to Visit  Medication Sig Dispense Refill  . atorvastatin (LIPITOR) 80 MG tablet TAKE 1 TABLET BY MOUTH  DAILY AT 6 PM 90 tablet 3  . buPROPion (WELLBUTRIN XL) 150 MG 24 hr tablet Take 1 tablet (150 mg total) by mouth daily. 90 tablet 1  . colchicine 0.6 MG tablet Take 1 tablet (0.6 mg total) by mouth 2 (two) times daily as needed (gout). 20 tablet 1  . gabapentin (NEURONTIN) 300 MG capsule Take 1 capsule (300 mg total) by mouth 3 (three) times daily. 270 capsule 1  . lisinopril (PRINIVIL,ZESTRIL) 5 MG tablet Take 1 tablet (5 mg total) by mouth daily. 90 tablet 3  . rivaroxaban (XARELTO) 20 MG TABS tablet TAKE 1 TABLET(20 MG) BY MOUTH DAILY WITH DINNER 90 tablet 3  . sertraline (ZOLOFT) 50 MG tablet Take 1 tablet (50 mg total) by mouth daily. 90 tablet 3  . traMADol (ULTRAM) 50 MG tablet Take 1 tablet (50  mg total) by mouth 2 (two) times daily as needed. 60 tablet 0  . varenicline (CHANTIX CONTINUING MONTH PAK) 1 MG tablet Take 1 tablet (1 mg total) by mouth 2 (two) times daily. 180 tablet 1   No current facility-administered medications on file prior to visit.     No Known Allergies  Assessment/Plan: Hyperlipidemia: LDL not goal likely due to noncompliance. Pt restarted his atorvastatin a few days ago. Encouraged compliance. Will recheck panel in 2 months to determine need for additional therapy including Zetia vs. PCSK9i therapy. Follow up in lipid clinic at that time.   Tobacco cessation: discussed chantix at length. Recommended against using Chantix in combination with smoking marijuana. Advised to contact primary doctor with concerns about mj use with chantix.    Thank  you,  Freddie Apley. Cleatis Polka, PharmD  [facility]  06/17/2017 9:00 AM

## 2017-06-17 NOTE — Progress Notes (Signed)
Office Visit Note   Patient: [name]           Date of Birth: [birth year]           MRN: [id number] Visit Date: 06/17/2017              Requested by: Porfirio Oar, PA-C [address] [place], [place] [address] PCP: Porfirio Oar, PA-C   Assessment & Plan: Visit Diagnoses:  1. Chronic left shoulder pain     Plan: Adhesive capsulitis left shoulder associated with mild arthritis.  Status post CVA affecting left side several years ago.  Intra-articular cortisone injection left shoulder.  Physical therapy for range of motion.  Office 6 weeks.  I spent over 45 minutes discussing x-rays, diagnosis and treatment options percent of the time was spent in counseling  Follow-Up Instructions: Return in about 6 weeks (around 07/29/2017).   Orders:  Orders Placed This Encounter  Procedures  . Large Joint Inj: L glenohumeral   No orders of the defined types were placed in this encounter.     Procedures: Large Joint Inj: L glenohumeral on 06/17/2017 10:53 AM Details: 25 G 1.5 in needle, posterior approach  Arthrogram: No  Medications: 2 mL lidocaine 1 %; 2 mL bupivacaine 0.5 %; 80 mg methylPREDNISolone acetate 40 MG/ML Procedure, treatment alternatives, risks and benefits explained, specific risks discussed. Consent was given by the patient. Immediately prior to procedure a time out was called to verify the correct patient, procedure, equipment, support staff and site/side marked as required. Patient was prepped and draped in the usual sterile fashion.       Clinical Data: No additional findings.   Subjective: Chief Complaint  Patient presents with  . Left Shoulder - Pain  . Left Hip - Pain  . New Patient (Initial Visit)    l shoulder pain for 3 yrs after stroke, no injury l hip pain prior to stroke but has gotten worse since the stroke  62 year old gentleman several years status post CVA affecting the left side.  He is having considerable pain in his left shoulder with  limited range of motion that affects his activities.  He has limited use of his left upper extremity related to loss of motor skills and some sensation.  He does have pain with overhead motion of the left shoulder.  No injury or trauma. X-rays were recently performed of his left shoulder on 05/12/2017 I reviewed these on the PACS system.  There is some disuse atrophy.  There is some mild degenerative changes with subchondral cysts and prominence of the greater tuberosity.  No obvious fracture.  Some degenerative changes at the acromioclavicular joint.  It appears the humeral head is centered about the glenoid.  HPI  Review of Systems  Constitutional: Negative for fatigue and fever.  HENT: Negative for ear pain.   Eyes: Negative for pain.  Respiratory: Negative for cough and shortness of breath.   Cardiovascular: Negative for leg swelling.  Gastrointestinal: Negative for constipation and diarrhea.  Genitourinary: Negative for difficulty urinating.  Musculoskeletal: Negative for back pain and neck pain.  Skin: Negative for rash.  Allergic/Immunologic: Negative for food allergies.  Neurological: Positive for weakness and numbness.  Hematological: Bruises/bleeds easily.  Psychiatric/Behavioral: Positive for sleep disturbance.     Objective: Vital Signs: BP 109/65 (BP Location: Right Arm, Patient Position: Sitting, Cuff Size: Normal)   Pulse 64   Resp 18   Ht  (1.676 m)   Wt 153 lb (69.4 kg)   BMI  24.69 kg/m   Physical Exam  Constitutional: He is oriented to person, place, and time. He appears well-developed and well-nourished.  HENT:  Mouth/Throat: Oropharynx is clear and moist.  Eyes: Pupils are equal, round, and reactive to light. EOM are normal.  Pulmonary/Chest: Effort normal.  Neurological: He is alert and oriented to person, place, and time. He exhibits abnormal muscle tone.  Skin: Skin is warm and dry.  Psychiatric: He has a normal mood and affect. His behavior is  normal.    Ortho Exam awake alert and oriented x3.  Comfortable sitting.  Stroke has affected predominantly his left upper extremity.  Had limited grip.  Poor subjective sensibility.  I was able to flex the shoulder up 115 degrees with a goniometer.  Abduct about 80 degrees.  At that point he was quite tight and uncomfortable.  No grating or crepitation.  Skin intact.  Mild discomfort along the anterior aspect of the shoulder with acromioclavicular joint in the subacromial region.  Biceps appears to be in its normal position.  No pain with range of motion of the cervical spine.  Increased tone related to his stroke.  I was able to fully extend the elbow.  Specialty Comments:  No specialty comments available.  Imaging: No results found.   PMFS History: Patient Active Problem List   Diagnosis Date Noted  . Gout 05/12/2017  . Chronic left shoulder pain 06/13/2015  . Neuropathic pain of shoulder 06/13/2015  . Acromioclavicular joint arthritis 03/26/2015  . PAF (paroxysmal atrial fibrillation) (HCC) 03/20/2015  . Leukocytosis 03/20/2015  . Hematemesis 03/19/2015  . Depression due to old stroke 02/26/2015  . Hemiparesis and alteration of sensations as late effects of stroke (HCC) 10/29/2014  . Hypersomnia with sleep apnea 10/29/2014  . Nicotine use disorder 10/29/2014  . Left anterior shoulder pain 10/23/2014  . Atrial fibrillation (HCC) 09/06/2014  . Left spastic hemiparesis (HCC) 08/07/2014  . Left-sided neglect 08/07/2014  . History of stroke 08/01/2014  . Tobacco dependence   . Carotid artery occlusion with infarction (HCC)   . History of ETOH abuse   . Hyperlipidemia   . Essential hypertension, benign 10/11/2013   Past Medical History:  Diagnosis Date  . AKI (acute kidney injury) (HCC) 03/20/2015  . Alcohol use with alcohol-induced mood disorder (HCC)    quit almost all ETOH in 07/2014, previously drank 18 to 20 beers daily.   . Alterations of sensations following CVA  (cerebrovascular accident) 09/10/2014  . Arthritis   . Colon polyps 2011   Hyperplastic and 1 tubular adenoma.  Dr Loreta Ave  . 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 identified on TEE at time of stroke  . Embolic stroke involving right middle cerebral artery (HCC) 10/29/2014  . Gout   . Hyperlipidemia   . Hypertension   . Paroxysmal atrial fibrillation (HCC)    a. identified on LINQ 08/2014  . Stroke (HCC) 07/25/14   a. s/p MDT ILR implant.  on xarelto    Family History  Problem Relation Age of Onset  . Cancer Father        leukemia  . Heart attack Mother   . Heart disease Brother   . Heart attack Brother 65       CABG, stenting  . Hypertension Brother   . Alcohol abuse Brother     Past Surgical History:  Procedure Laterality Date  . COLONOSCOPY  2011   Dr Loreta Ave.   . EP IMPLANTABLE DEVICE N/A 07/27/2014   Procedure: Loop Recorder Insertion;  Surgeon: Marinus Maw, MD;  [facility] INVASIVE CV LAB;  Service: Cardiovascular;  Laterality: N/A;  . EP IMPLANTABLE DEVICE N/A 12/20/2014   Procedure: Loop Recorder Removal;  Surgeon: Marinus Maw, MD;  Location: [facility] INVASIVE CV LAB;  Service: Cardiovascular;  Laterality: N/A;  . ESOPHAGOGASTRODUODENOSCOPY N/A 03/21/2015   Procedure: ESOPHAGOGASTRODUODENOSCOPY (EGD);  Surgeon: Jeani Hawking, MD;  Location: [facility] ENDOSCOPY;  Service: Endoscopy;  Laterality: N/A;  . LOOP RECORDER IMPLANT  07/27/14   LOOP REVEAL LINQ ZOX09 - [id number]  . TEE WITHOUT CARDIOVERSION N/A 07/27/2014   Procedure: TRANSESOPHAGEAL ECHOCARDIOGRAM (TEE);  Surgeon: Chrystie Nose, MD;  Location[facility] ENDOSCOPY;  Service: Cardiovascular;  Laterality: N/A;   Social History   Occupational History  . Occupation: former Merchandiser, retail    Comment: disabled due to CVA  Tobacco Use  . Smoking status: Current Every Day Smoker    Packs/day: 0.50    Years: 40.00    Pack years: 20.00    Types: Cigarettes  . Smokeless tobacco: Never Used  . Tobacco comment: cut back after stroke, but increased  again due to depression  Substance and Sexual Activity  . Alcohol use: Yes    Alcohol/week: 0.0 - 1.2 oz  . Drug use: Yes    Types: Marijuana    Comment: trying to help his appetite  . Sexual activity: Yes    Partners: Female

## 2017-06-17 NOTE — Patient Instructions (Signed)
CONTINUE atorvastatin  daily  We will recheck labs in 2 months to evaluate need for injection  Follow up visit the day after fasting labs   Cholesterol Cholesterol is a fat. Your body needs a small amount of cholesterol. Cholesterol (plaque) may build up in your blood vessels (arteries). That makes you more likely to have a heart attack or stroke. You cannot feel your cholesterol level. Having a blood test is the only way to find out if your level is high. Keep your test results. Work with your doctor to keep your cholesterol at a good level. What do the results mean?  Total cholesterol is how much cholesterol is in your blood.  LDL is bad cholesterol. This is the type that can build up. Try to have low LDL.  HDL is good cholesterol. It cleans your blood vessels and carries LDL away. Try to have high HDL.  Triglycerides are fat that the body can store or burn for energy. What are good levels of cholesterol?  Total cholesterol below 200.  LDL below 100 is good for people who have health risks. LDL below 70 is good for people who have very high risks.  HDL above 40 is good. It is best to have HDL of 60 or higher.  Triglycerides below 150. How can I lower my cholesterol? Diet Follow your diet program as told by your doctor.  Choose fish, white meat chicken, or [place] that is roasted or baked. Try not to eat red meat, fried foods, sausage, or lunch meats.  Eat lots of fresh fruits and vegetables.  Choose whole grains, beans, pasta, potatoes, and cereals.  Choose olive oil, corn oil, or canola oil. Only use small amounts.  Try not to eat butter, mayonnaise, shortening, or palm kernel oils.  Try not to eat foods with trans fats.  Choose low-fat or nonfat dairy foods. ? Drink skim or nonfat milk. ? Eat low-fat or nonfat yogurt and cheeses. ? Try not to drink whole milk or cream. ? Try not to eat ice cream, egg yolks, or full-fat cheeses.  Healthy desserts include angel  food cake, ginger snaps, animal crackers, hard candy, popsicles, and low-fat or nonfat frozen yogurt. Try not to eat pastries, cakes, pies, and cookies.  Exercise Follow your exercise program as told by your doctor.  Be more active. Try gardening, walking, and taking the stairs.  Ask your doctor about ways that you can be more active.  Medicine  Take over-the-counter and prescription medicines only as told by your doctor. This information is not intended to replace advice given to you by your health care provider. Make sure you discuss any questions you have with your health care provider. Document Released: 05/01/2008 Document Revised: 09/04/2015 Document Reviewed: 08/15/2015 Elsevier Interactive Patient Education  [employer].

## 2017-07-01 ENCOUNTER — Ambulatory Visit: Payer: Medicare (Managed Care)

## 2017-07-11 NOTE — Assessment & Plan Note (Signed)
Per cardiology 

## 2017-07-11 NOTE — Assessment & Plan Note (Signed)
-   Start sertraline 

## 2017-07-11 NOTE — Assessment & Plan Note (Signed)
Recommend that he do the HEP and follow-up with rehab as directed.

## 2017-07-11 NOTE — Assessment & Plan Note (Signed)
Controlled.  

## 2017-07-11 NOTE — Assessment & Plan Note (Signed)
Smoking cessation encouraged!

## 2017-07-12 ENCOUNTER — Encounter: Payer: Self-pay | Admitting: Family Medicine

## 2017-07-20 ENCOUNTER — Ambulatory Visit: Payer: Medicare Other | Attending: Orthopaedic Surgery | Admitting: Physical Therapy

## 2017-07-20 ENCOUNTER — Encounter: Payer: Self-pay | Admitting: Physical Therapy

## 2017-07-20 DIAGNOSIS — M6281 Muscle weakness (generalized): Secondary | ICD-10-CM | POA: Diagnosis not present

## 2017-07-20 DIAGNOSIS — M25612 Stiffness of left shoulder, not elsewhere classified: Secondary | ICD-10-CM | POA: Diagnosis not present

## 2017-07-20 DIAGNOSIS — M25512 Pain in left shoulder: Secondary | ICD-10-CM | POA: Insufficient documentation

## 2017-07-20 DIAGNOSIS — G8929 Other chronic pain: Secondary | ICD-10-CM

## 2017-07-20 NOTE — Patient Instructions (Addendum)
SHOULDER: External Rotation - Supine (Cane)   Hold cane with both hands. Rotate arm away from body. Keep elbow on floor and next to body. __10_ reps per set, __2_ sets per day, _5-7  days per week Add towel to keep elbow at side.  Copyright  VHI. All rights reserved.  Cane Horizontal - Supine   With straight arms holding cane above shoulders, bring cane out to right, center, out to left, and back to above head. Repeat _10__ times. Do _2  times per day.  Copyright  VHI. All rights reserved.  Cane Exercise: Flexion   Lie on back, holding cane above chest. Keeping arms as straight as possible, lower cane toward floor beyond head. Hold __10__ seconds. Repeat __10__ times. Do ___2_ sessions per day.  http://gt2.exer.us/91   Copyright  VHI. All rights reserved.  Closed Chain: Shoulder Abduction / Adduction - on Wall    One hand on wall, step to side and return. Stepping causes shoulder to abduct and adduct. Step __5_ times, each side, __2_ times per day.  http://ss.exer.us/266   Copyright  VHI. All rights reserved.   Closed Chain: Shoulder Flexion / Extension - on Wall    Hands on wall, step backward. Return. Stepping causes shoulder flexion and extension Do __5_ times, each foot, __2_ times per day.  http://ss.exer.us/264   Copyright  VHI. All rights reserved.

## 2017-07-20 NOTE — Therapy (Signed)
[facility] [address] [place], [place], [address] Phone: [phone]   Fax:  [phone]  Physical Therapy Evaluation  Patient Details  Name: [name] MRN: [id number] Date of Birth: Oct 31, 1955 Referring Provider: Dr. Norlene Campbell    Encounter Date: 07/20/2017  PT End of Session - 07/20/17 0827    Visit Number  1    Number of Visits  12    Date for PT Re-Evaluation  08/31/17    PT Start Time  0802    PT Stop Time  0851    PT Time Calculation (min)  49 min    Activity Tolerance  Patient tolerated treatment well    Behavior During Therapy  [facility] for tasks assessed/performed       Past Medical History:  Diagnosis Date  . AKI (acute kidney injury) (HCC) 03/20/2015  . Alcohol use with alcohol-induced mood disorder (HCC)    quit almost all ETOH in 07/2014, previously drank 18 to 20 beers daily.   . Alterations of sensations following CVA (cerebrovascular accident) 09/10/2014  . Arthritis   . Colon polyps 2011   Hyperplastic and 1 tubular adenoma.  Dr Loreta Ave  . 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 identified on TEE at time of stroke  . Embolic stroke involving right middle cerebral artery (HCC) 10/29/2014  . Gout   . Hyperlipidemia   . Hypertension   . Paroxysmal atrial fibrillation (HCC)    a. identified on LINQ 08/2014  . Stroke (HCC) 07/25/14   a. s/p MDT ILR implant.  on xarelto    Past Surgical History:  Procedure Laterality Date  . COLONOSCOPY  2011   Dr Loreta Ave.   . EP IMPLANTABLE DEVICE N/A 07/27/2014   Procedure: Loop Recorder Insertion;  Surgeon: Marinus Maw, MD;  Location: [facility] INVASIVE CV LAB;  Service: Cardiovascular;  Laterality: N/A;  . EP IMPLANTABLE DEVICE N/A 12/20/2014   Procedure: Loop Recorder Removal;  Surgeon: Marinus Maw, MD;  Location: [facility] INVASIVE CV LAB;  Service: Cardiovascular;  Laterality: N/A;  . ESOPHAGOGASTRODUODENOSCOPY N/A 03/21/2015   Procedure: ESOPHAGOGASTRODUODENOSCOPY (EGD);  Surgeon: Jeani Hawking, MD;  Location[facility] ENDOSCOPY;  Service: Endoscopy;  Laterality: N/A;  . LOOP RECORDER IMPLANT  07/27/14   LOOP REVEAL LINQ ONG29 - [id number]  . TEE WITHOUT CARDIOVERSION N/A 07/27/2014   Procedure: TRANSESOPHAGEAL ECHOCARDIOGRAM (TEE);  Surgeon: Chrystie Nose, MD;  Location: [facility] ENDOSCOPY;  Service: Cardiovascular;  Laterality: N/A;    There were no vitals filed for this visit.   Subjective Assessment - 07/20/17 0807    Subjective  Patient presents with L UE pain, stiffness and sensory changes which have been ongoing since his CVA in 2016.   He had many months of therapy at [facility].  He has difficulty holding objects, picking up items.  He has abnormal sensation in L UE, has to be careful with bumping into things.  L hip pain as well.   L shoulder pain limits his ability to reach up high into cabinets, back of head, get dressed.      Pertinent History  CVA 2016 with L sided weakness, depression, arthritis, kidney disease , L hip pain     Limitations  House hold activities;Lifting    How long can you stand comfortably?  limited by L hip     Diagnostic tests  Recent injection.  XR 04/2017 showed degenerative changes mild degenerative changes with subchondral cysts and prominence of the greater tuberosity    Patient Stated  Goals  Patient would like to get back to normal, play golf     Currently in Pain?  Yes    Pain Score  6     Pain Location  Shoulder    Pain Orientation  Left    Pain Descriptors / Indicators  Tingling;Sore    Pain Type  Chronic pain    Pain Radiating Towards  lower arm     Pain Onset  More than a month ago    Pain Frequency  Constant    Aggravating Factors   using it, reaching, lifting     Pain Relieving Factors  meds, cream     Effect of Pain on Daily Activities  he has to be careful when he goes out, sensitive to heat and cold due to CVA     Multiple Pain Sites  Yes    Pain Score  7    Pain Location  Hip    Pain Orientation  Left;Lateral    Pain Descriptors /  Indicators  Discomfort;Aching    Pain Type  Chronic pain    Pain Onset  More than a month ago    Pain Frequency  Intermittent    Aggravating Factors   standing, walking     Pain Relieving Factors  not sure          [facility] - 07/20/17 0001      Assessment   Medical Diagnosis  L UE frozen shoulder , arthritis     Referring Provider  Dr. Norlene CampbellPeter [name]     Onset Date/Surgical Date  -- chronic     Hand Dominance  Right      Precautions   Precautions  Other (comment)    Precaution Comments  lacks proprioception       Balance Screen   Has the patient fallen in the past 6 months  Yes    How many times?  2 L foot gives way     Has the patient had a decrease in activity level because of a fear of falling?   Yes    Is the patient reluctant to leave their home because of a fear of falling?   No      Home Environment   Living Environment  Private residence    Living Arrangements  Spouse/significant other    Type of Home  House    Home Access  Stairs to enter    Entrance Stairs-Number of Steps  4    Entrance Stairs-Rails  None    Home Layout  One level    Home Equipment  [place] - single point    Additional Comments  does not use       Prior Function   Level of Independence  Independent    Vocation  On disability    Vocation Requirements  was a Community education officermeat cutter       Cognition   Overall Cognitive Status  History of cognitive impairments - at baseline      Sensation   Light Touch  Impaired by gross assessment    Hot/Cold  Impaired by gross assessment    Proprioception  Impaired by gross assessment    Additional Comments  shoulder and LE       Coordination   Gross Motor Movements are Fluid and Coordinated  Not tested      Posture/Postural Control   Posture/Postural Control  Postural limitations    Postural Limitations  Rounded Shoulders;Forward head;Increased thoracic kyphosis;Flexed trunk  AROM   Overall AROM Comments  Rt shoulder abd 152 deg, all WFL but  limited end range     Left Shoulder Extension  40 Degrees    Left Shoulder Flexion  105 Degrees pain     Left Shoulder ABduction  95 Degrees pain     Left Shoulder Internal Rotation  70 Degrees FR to lumbar     Left Shoulder External Rotation  23 Degrees FR to side of L neck      PROM   Overall PROM Comments  External rot 32 deg , Internal rot. 80 deg       Strength   Right Shoulder Flexion  4+/5    Right Shoulder ABduction  4+/5    Left Shoulder Flexion  3+/5    Left Shoulder ABduction  3+/5    Left Shoulder Internal Rotation  5/5    Left Shoulder External Rotation  4-/5      Palpation   Palpation comment  min pain anterior shoulder with palpation , mildin deltoid                 Objective measurements completed on examination: See above findings.      [facility] Adult PT Treatment/Exercise - 07/20/17 0001      Shoulder Exercises: Supine   External Rotation  AAROM;Left;10 reps    Flexion  AAROM;Both;10 reps      Shoulder Exercises: Standing   Flexion  AAROM;Both;10 reps    ABduction  AAROM;Left;10 reps      Moist Heat Therapy   Number Minutes Moist Heat  10 Minutes    Moist Heat Location  Shoulder             PT Education - 07/20/17 1318    Education Details  PT/POC, HEP, prognosis, MHP vs ice     Person(s) Educated  Patient    Methods  Explanation;Verbal cues;Tactile cues;Demonstration;Handout    Comprehension  Verbalized understanding;Need further instruction          PT Long Term Goals - 07/20/17 1319      PT LONG TERM GOAL #1   Title  Pt wil be able to raise his arm to 120 deg for reaching with pain no more than 3/10     Time  6    Period  Weeks    Status  New    Target Date  08/31/17      PT LONG TERM GOAL #2   Title  Pt will be able to use L arm for ADLs, normal household activities and pain <3/10.     Time  6    Period  Weeks    Status  New    Target Date  08/31/17      PT LONG TERM GOAL #3   Title  Pt will be able to demo 4+/5  strength in L UE for maximal use during lifting, carrying items while golfing, fishing    Time  6    Period  Weeks    Status  New    Target Date  08/31/17      PT LONG TERM GOAL #4   Title  Pt will be I with HEP for posture, L UE strength and ROM    Time  6    Period  Weeks    Status  New    Target Date  08/31/17             Plan - 07/20/17 1329    Clinical  Impression Statement  Patient presents for low complexity eval of L shoulder pain, acute on chronic.  He had an injection with min relief.  He has limited ROM, strength and altered sensation due to late effects of CVA from 2016.  He may be able to gain some improvement in functional ROM and strength, allow him improved comfort at rest.      Clinical Presentation  Stable    Clinical Decision Making  Low    Rehab Potential  Good    PT Frequency  2x / week    PT Duration  6 weeks    PT Treatment/Interventions  ADLs/Self Care Home Management;Electrical Stimulation;Functional mobility training;Neuromuscular re-education;Taping;Therapeutic activities;Iontophoresis 4mg /ml Dexamethasone;Moist Heat;Cryotherapy;Ultrasound;Therapeutic exercise;Patient/family education;Manual techniques;Passive range of motion;Dry needling    PT Next Visit Plan  AAROM for L shoulder, strength to tolerance, pulley/UBE , MHP vs ice     PT Home Exercise Plan  wall walks, supine cane (needs help with grip)     Consulted and Agree with Plan of Care  Patient       Patient will benefit from skilled therapeutic intervention in order to improve the following deficits and impairments:  Decreased mobility, Difficulty walking, Hypomobility, Impaired sensation, Decreased range of motion, Decreased activity tolerance, Decreased strength, Increased fascial restricitons, Impaired flexibility, Postural dysfunction, Pain, Impaired UE functional use  Visit Diagnosis: Chronic left shoulder pain  Stiffness of left shoulder, not elsewhere classified  Muscle weakness  (generalized)     Problem List Patient Active Problem List   Diagnosis Date Noted  . Gout 05/12/2017  . Chronic left shoulder pain 06/13/2015  . Neuropathic pain of shoulder 06/13/2015  . Acromioclavicular joint arthritis 03/26/2015  . PAF (paroxysmal atrial fibrillation) (HCC) 03/20/2015  . Leukocytosis 03/20/2015  . Hematemesis 03/19/2015  . Depression due to old stroke 02/26/2015  . Hemiparesis and alteration of sensations as late effects of stroke (HCC) 10/29/2014  . Hypersomnia with sleep apnea 10/29/2014  . Nicotine use disorder 10/29/2014  . Left anterior shoulder pain 10/23/2014  . Atrial fibrillation (HCC) 09/06/2014  . Left spastic hemiparesis (HCC) 08/07/2014  . Left-sided neglect 08/07/2014  . History of stroke 08/01/2014  . Tobacco dependence   . Carotid artery occlusion with infarction (HCC)   . History of ETOH abuse   . Hyperlipidemia   . Essential hypertension, benign 10/11/2013    Salam Chesterfield 07/20/2017, 1:42 PM  [facility] [address] [place], [place], [address] Phone: [phone]   Fax:  [phone]  Name: [name] MRN: [id number] Date of Birth: Jul 13, 1955   Karie Mainland, PT 07/20/17 1:42 PM Phone: [phone] Fax: [phone]

## 2017-07-21 ENCOUNTER — Ambulatory Visit: Payer: Medicare Other | Admitting: Physician Assistant

## 2017-07-22 ENCOUNTER — Ambulatory Visit: Payer: Medicare Other | Admitting: Physical Therapy

## 2017-07-22 ENCOUNTER — Encounter: Payer: Self-pay | Admitting: Physical Therapy

## 2017-07-22 DIAGNOSIS — G8929 Other chronic pain: Secondary | ICD-10-CM

## 2017-07-22 DIAGNOSIS — M6281 Muscle weakness (generalized): Secondary | ICD-10-CM

## 2017-07-22 DIAGNOSIS — M25612 Stiffness of left shoulder, not elsewhere classified: Secondary | ICD-10-CM | POA: Diagnosis not present

## 2017-07-22 DIAGNOSIS — M25512 Pain in left shoulder: Secondary | ICD-10-CM | POA: Diagnosis not present

## 2017-07-22 NOTE — Therapy (Signed)
[facility] [address] [place], [place], [address] Phone: [phone]   Fax:  [phone]  Physical Therapy Treatment  Patient Details  Name: [name] SpeckingJerry [name] MRN: [id number] Date of Birth: 1956/02/08 Referring Provider: Dr. Norlene CampbellPeter Whitfield    Encounter Date: 07/22/2017  PT End of Session - 07/22/17 1434    Visit Number  2    Number of Visits  12    Date for PT Re-Evaluation  08/31/17    PT Start Time  1419    PT Stop Time  1505    PT Time Calculation (min)  46 min    Activity Tolerance  Patient tolerated treatment well    Behavior During Therapy  [facility] for tasks assessed/performed       Past Medical History:  Diagnosis Date  . AKI (acute kidney injury) (HCC) 03/20/2015  . Alcohol use with alcohol-induced mood disorder (HCC)    quit almost all ETOH in 07/2014, previously drank 18 to 20 beers daily.   . Alterations of sensations following CVA (cerebrovascular accident) 09/10/2014  . Arthritis   . Colon polyps 2011   Hyperplastic and 1 tubular adenoma.  Dr Loreta AveMann  . 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 identified on TEE at time of stroke  . Embolic stroke involving right middle cerebral artery (HCC) 10/29/2014  . Gout   . Hyperlipidemia   . Hypertension   . Paroxysmal atrial fibrillation (HCC)    a. identified on LINQ 08/2014  . Stroke (HCC) 07/25/14   a. s/p MDT ILR implant.  on xarelto    Past Surgical History:  Procedure Laterality Date  . COLONOSCOPY  2011   Dr Loreta AveMann.   . EP IMPLANTABLE DEVICE N/A 07/27/2014   Procedure: Loop Recorder Insertion;  Surgeon: Marinus MawGregg W Taylor, MD;  [facility];  Service: Cardiovascular;  Laterality: N/A;  . EP IMPLANTABLE DEVICE N/A 12/20/2014   Procedure: Loop Recorder Removal;  Surgeon: Marinus MawGregg W Taylor, MD;  [facility];  Service: Cardiovascular;  Laterality: N/A;  . ESOPHAGOGASTRODUODENOSCOPY N/A 03/21/2015   Procedure: ESOPHAGOGASTRODUODENOSCOPY (EGD);  Surgeon: Jeani HawkingPatrick  Hung, MD;  [facility];  Service: Endoscopy;  Laterality: N/A;  . LOOP RECORDER IMPLANT  07/27/14   LOOP REVEAL LINQ [id number] - [id number]  . TEE WITHOUT CARDIOVERSION N/A 07/27/2014   Procedure: TRANSESOPHAGEAL ECHOCARDIOGRAM (TEE);  Surgeon: Chrystie NoseKenneth C Hilty, MD;  [facility];  Service: Cardiovascular;  Laterality: N/A;    There were no vitals filed for this visit.  Subjective Assessment - 07/22/17 1422    Subjective  I believe its getting better, Lavenia Atlasve been working it.     Currently in Pain?  Yes    Pain Score  4          [facility] PT Assessment - 07/22/17 0001      Cognition   Behaviors  -- L side neglect          [facility] Adult PT Treatment/Exercise - 07/22/17 0001      Shoulder Exercises: Standing   Extension  Strengthening;Both;15 reps;Theraband    Theraband Level (Shoulder Extension)  Level 2 (Red)    Row  Strengthening;Both;12 reps;Theraband    Theraband Level (Shoulder Row)  Level 2 (Red)      Shoulder Exercises: Pulleys   Flexion  2 minutes    Scaption  2 minutes    Scaption Limitations  manual cues, help with grip       Shoulder Exercises: ROM/Strengthening   UBE (  Upper Arm Bike)  5 min L1     Other ROM/Strengthening Exercises  supine UE ranger x 20 each : flexion, chest press, ER/IR     Other ROM/Strengthening Exercises  horiz abd /add x 10       Moist Heat Therapy   Number Minutes Moist Heat  10 Minutes    Moist Heat Location  Shoulder      Manual Therapy   Manual Therapy  Joint mobilization;Scapular mobilization;Passive ROM    Joint Mobilization  Gr I-II inferior glides     Scapular Mobilization  restricted elevation, felt good to patient     Passive ROM  all planes to tolerance              PT Education - 07/22/17 1434    Education Details  ROM, pulleys     Person(s) Educated  Patient    Methods  Explanation    Comprehension  Verbalized understanding          PT Long Term Goals - 07/20/17 1319      PT LONG TERM GOAL #1    Title  Pt wil be able to raise his arm to 120 deg for reaching with pain no more than 3/10     Time  6    Period  Weeks    Status  New    Target Date  08/31/17      PT LONG TERM GOAL #2   Title  Pt will be able to use L arm for ADLs, normal household activities and pain <3/10.     Time  6    Period  Weeks    Status  New    Target Date  08/31/17      PT LONG TERM GOAL #3   Title  Pt will be able to demo 4+/5 strength in L UE for maximal use during lifting, carrying items while golfing, fishing    Time  6    Period  Weeks    Status  New    Target Date  08/31/17      PT LONG TERM GOAL #4   Title  Pt will be I with HEP for posture, L UE strength and ROM    Time  6    Period  Weeks    Status  New    Target Date  08/31/17            Plan - 07/22/17 1455    Clinical Impression Statement  Pt here for first session, noted decreased L attention to L UE and grip.  Works hard but difficulty relaxing.  MHP for soreness post session.     PT Treatment/Interventions  ADLs/Self Care Home Management;Electrical Stimulation;Functional mobility training;Neuromuscular re-education;Taping;Therapeutic activities;Iontophoresis 4mg /ml Dexamethasone;Moist Heat;Cryotherapy;Ultrasound;Therapeutic exercise;Patient/family education;Manual techniques;Passive range of motion;Dry needling    PT Next Visit Plan  AAROM for L shoulder, strength to tolerance, pulley/UBE , MHP vs ice     PT Home Exercise Plan  wall walks, supine cane (needs help with grip)     Consulted and Agree with Plan of Care  Patient       Patient will benefit from skilled therapeutic intervention in order to improve the following deficits and impairments:  Decreased mobility, Difficulty walking, Hypomobility, Impaired sensation, Decreased range of motion, Decreased activity tolerance, Decreased strength, Increased fascial restricitons, Impaired flexibility, Postural dysfunction, Pain, Impaired UE functional use  Visit  Diagnosis: Chronic left shoulder pain  Stiffness of left shoulder, not elsewhere classified  Muscle weakness (  generalized)     Problem List Patient Active Problem List   Diagnosis Date Noted  . Gout 05/12/2017  . Chronic left shoulder pain 06/13/2015  . Neuropathic pain of shoulder 06/13/2015  . Acromioclavicular joint arthritis 03/26/2015  . PAF (paroxysmal atrial fibrillation) (HCC) 03/20/2015  . Leukocytosis 03/20/2015  . Hematemesis 03/19/2015  . Depression due to old stroke 02/26/2015  . Hemiparesis and alteration of sensations as late effects of stroke (HCC) 10/29/2014  . Hypersomnia with sleep apnea 10/29/2014  . Nicotine use disorder 10/29/2014  . Left anterior shoulder pain 10/23/2014  . Atrial fibrillation (HCC) 09/06/2014  . Left spastic hemiparesis (HCC) 08/07/2014  . Left-sided neglect 08/07/2014  . History of stroke 08/01/2014  . Tobacco dependence   . Carotid artery occlusion with infarction (HCC)   . History of ETOH abuse   . Hyperlipidemia   . Essential hypertension, benign 10/11/2013    PAA,JENNIFER 07/22/2017, 2:59 PM  [facility] [address] [place], [place], [address] Phone: [phone]   Fax:  [phone]  Name: [name] MRN: [id number] Date of Birth: 1955/04/14  Karie Mainland, PT 07/22/17 2:59 PM Phone: [phone] Fax: [phone]

## 2017-07-23 ENCOUNTER — Other Ambulatory Visit: Payer: Self-pay

## 2017-07-23 ENCOUNTER — Ambulatory Visit (INDEPENDENT_AMBULATORY_CARE_PROVIDER_SITE_OTHER): Payer: Medicare Other | Admitting: Physician Assistant

## 2017-07-23 ENCOUNTER — Encounter: Payer: Self-pay | Admitting: Physician Assistant

## 2017-07-23 DIAGNOSIS — F0631 Mood disorder due to known physiological condition with depressive features: Secondary | ICD-10-CM | POA: Diagnosis not present

## 2017-07-23 DIAGNOSIS — F17200 Nicotine dependence, unspecified, uncomplicated: Secondary | ICD-10-CM

## 2017-07-23 DIAGNOSIS — I69398 Other sequelae of cerebral infarction: Secondary | ICD-10-CM | POA: Diagnosis not present

## 2017-07-23 MED ORDER — SERTRALINE HCL 100 MG PO TABS: 100.0000 mg | ORAL_TABLET | Freq: Every day | ORAL | 3 refills | Status: DC

## 2017-07-23 NOTE — Patient Instructions (Addendum)
INCREASE the sertraline from 50 mg to 100 mg. You can use up the 50 mg tablets you have by taking 2 of them together every day. I have ordered the 100 mg tablet.    IF you received an x-ray today, you will receive an invoice from [facility]. Please contact [facility] at [phone] with questions or concerns regarding your invoice.   IF you received labwork today, you will receive an invoice from [facility]. Please contact [facility] at [phone] with questions or concerns regarding your invoice.   Our billing staff will not be able to assist you with questions regarding bills from these companies.  You will be contacted with the lab results as soon as they are available. The fastest way to get your results is to activate your My Chart account. Instructions are located on the last page of this paperwork. If you have not heard from us regarding the results in 2 weeks, please contact this office.

## 2017-07-23 NOTE — Assessment & Plan Note (Signed)
Finally motivated to quit. Reminded to pick up the Chantix sent to the pharmacy.

## 2017-07-23 NOTE — Assessment & Plan Note (Signed)
Increase sertraline to 100 mg

## 2017-07-23 NOTE — Progress Notes (Signed)
Patient ID: [name], male    DOB: 1955-06-01, 62 y.o.   MRN: [id number]  PCP: Porfirio Oar, PA-C  Chief Complaint  Patient presents with  . Depression    screening done  . Anxiety    screening done    Subjective:   Presents for evaluation of anxiety and depression. He is accompanied by his wife.  He is tolerating the addition of sertraline without difficulty. Seems to be helping his mood, sopecifically, the depression.  Back in PT. Improving ability to use his LEFT side. Planning to see eye specialist soon.  "Always worrying. Because I don't know what's going to happen tomorrow. It's like walking on egg shells." Losing weight. Not eating right. Difficulty chewing and swallowing. No choking. Just no appetite.  Has not started Chantix yet. Really wants to quit this time.    Review of Systems   Depression screen [facility] 2/9 07/23/2017 06/15/2017 05/12/2017 12/10/2015 08/06/2015  Decreased Interest 1 0 2 0 0  Down, Depressed, Hopeless 1 0 3 0 0  PHQ - 2 Score 2 0 5 0 0  Altered sleeping 0 - 0 - -  Tired, decreased energy 1 - 3 - -  Change in appetite 2 - 2 - -  Feeling bad or failure about yourself  2 - 3 - -  Trouble concentrating 3 - 0 - -  Moving slowly or fidgety/restless 1 - 1 - -  Suicidal thoughts 1 - 3 - -  PHQ-9 Score 12 - 17 - -  Difficult doing work/chores Not difficult at all - Very difficult - -  Some recent data might be hidden    GAD 7 : Generalized Anxiety Score 07/23/2017  Nervous, Anxious, on Edge 3  Control/stop worrying 3  Worry too much - different things 2  Trouble relaxing 1  Restless 2  Easily annoyed or irritable 3  Afraid - awful might happen 3  Total GAD 7 Score 17  Anxiety Difficulty Very difficult      Patient Active Problem List   Diagnosis Date Noted  . Gout 05/12/2017  . Chronic left shoulder pain 06/13/2015  . Neuropathic pain of shoulder 06/13/2015  . Acromioclavicular joint arthritis 03/26/2015  . PAF (paroxysmal  atrial fibrillation) (HCC) 03/20/2015  . Leukocytosis 03/20/2015  . Hematemesis 03/19/2015  . Depression due to old stroke 02/26/2015  . Hemiparesis and alteration of sensations as late effects of stroke (HCC) 10/29/2014  . Hypersomnia with sleep apnea 10/29/2014  . Nicotine use disorder 10/29/2014  . Left anterior shoulder pain 10/23/2014  . Atrial fibrillation (HCC) 09/06/2014  . Left spastic hemiparesis (HCC) 08/07/2014  . Left-sided neglect 08/07/2014  . History of stroke 08/01/2014  . Tobacco dependence   . Carotid artery occlusion with infarction (HCC)   . History of ETOH abuse   . Hyperlipidemia   . Essential hypertension, benign 10/11/2013     Prior to Admission medications   Medication Sig Start Date End Date Taking? Authorizing Provider  atorvastatin (LIPITOR) 80 MG tablet TAKE 1 TABLET BY MOUTH  DAILY AT 6 PM 05/12/17  Yes Yosselin Zoeller, PA-C  buPROPion (WELLBUTRIN XL) 150 MG 24 hr tablet Take 1 tablet (150 mg total) by mouth daily. 05/12/17  Yes Armenta Erskin, PA-C  colchicine 0.6 MG tablet Take 1 tablet (0.6 mg total) by mouth 2 (two) times daily as needed (gout). 05/12/17  Yes Bertran Zeimet, PA-C  gabapentin (NEURONTIN) 300 MG capsule Take 1 capsule (300 mg total) by mouth 3 (  three) times daily. 05/12/17  Yes Jerrard Bradburn, PA-C  lisinopril (PRINIVIL,ZESTRIL) 5 MG tablet Take 1 tablet (5 mg total) by mouth daily. 05/12/17  Yes Bular Hickok, PA-C  rivaroxaban (XARELTO) 20 MG TABS tablet TAKE 1 TABLET(20 MG) BY MOUTH DAILY WITH DINNER 05/12/17  Yes Katianna Mcclenney, PA-C  traMADol (ULTRAM) 50 MG tablet Take 1 tablet (50 mg total) by mouth 2 (two) times daily as needed. 05/12/17  Yes Lenda Baratta, PA-C  sertraline (ZOLOFT) 50 MG tablet Take 1 tablet (50 mg total) by mouth daily. Patient not taking: Reported on 07/20/2017 06/15/17  Yes Saragrace Selke, PA-C  varenicline (CHANTIX CONTINUING MONTH PAK) 1 MG tablet Take 1 tablet (1 mg total) by mouth 2 (two) times  daily. Patient not taking: Reported on 06/17/2017 05/12/17   Porfirio OarJeffery, Irving Lubbers, PA-C     No Known Allergies     Objective:  Physical Exam  Constitutional: He is oriented to person, place, and time. He appears well-developed and well-nourished. He is active and cooperative. No distress.  BP 112/68 (BP Location: Right Arm, Patient Position: Sitting, Cuff Size: Normal)   Pulse 88   Temp 98 F (36.7 C) (Oral)   Resp 20   Ht 5' 6.93" (1.7 m)   Wt 144 lb 6.4 oz (65.5 kg)   SpO2 96%   BMI 22.66 kg/m   HENT:  Head: Normocephalic and atraumatic.  Right Ear: Hearing normal.  Left Ear: Hearing normal.  Eyes: Conjunctivae are normal. No scleral icterus.  Neck: Normal range of motion. Neck supple. No thyromegaly present.  Cardiovascular: Normal rate, regular rhythm and normal heart sounds.  Pulses:      Radial pulses are 2+ on the right side, and 2+ on the left side.  Pulmonary/Chest: Effort normal and breath sounds normal.  Lymphadenopathy:       Head (right side): No tonsillar, no preauricular, no posterior auricular and no occipital adenopathy present.       Head (left side): No tonsillar, no preauricular, no posterior auricular and no occipital adenopathy present.    He has no cervical adenopathy.       Right: No supraclavicular adenopathy present.       Left: No supraclavicular adenopathy present.  Neurological: He is alert and oriented to person, place, and time. No sensory deficit.  Neglects the LEFT side.  Skin: Skin is warm, dry and intact. No rash noted. No cyanosis or erythema. Nails show no clubbing.  Psychiatric: He has a normal mood and affect. His speech is normal and behavior is normal.    Wt Readings from Last 3 Encounters:  07/23/17 144 lb 6.4 oz (65.5 kg)  06/17/17 153 lb (69.4 kg)  06/15/17 153 lb (69.4 kg)       Assessment & Plan:   Problem List Items Addressed This Visit    Nicotine use disorder    Finally motivated to quit. Reminded to pick up the Chantix  sent to the pharmacy.      Depression due to old stroke    Increase sertraline to 100 mg.      Relevant Medications   sertraline (ZOLOFT) 100 MG tablet       Return in about 2 months (around 09/22/2017) for re-evaluation of mood, cholesterol, blood pressure.   Fernande Brashelle S. Deniz Hannan, PA-C Primary Care at [facility]

## 2017-07-26 ENCOUNTER — Ambulatory Visit: Payer: Medicare Other | Admitting: Physical Therapy

## 2017-07-26 ENCOUNTER — Encounter: Payer: Self-pay | Admitting: Physical Therapy

## 2017-07-26 DIAGNOSIS — G8929 Other chronic pain: Secondary | ICD-10-CM | POA: Diagnosis not present

## 2017-07-26 DIAGNOSIS — M25612 Stiffness of left shoulder, not elsewhere classified: Secondary | ICD-10-CM

## 2017-07-26 DIAGNOSIS — M25512 Pain in left shoulder: Principal | ICD-10-CM

## 2017-07-26 DIAGNOSIS — M6281 Muscle weakness (generalized): Secondary | ICD-10-CM

## 2017-07-26 NOTE — Therapy (Signed)
[facility] [address] [place], [place], [address] Phone: [phone]   Fax:  [phone]  Physical Therapy Treatment  Patient Details  Name: [name] MRN: [id number] Date of Birth: 11-23-1955 Referring Provider: Dr. Norlene CampbellPeter Whitfield    Encounter Date: 07/26/2017  PT End of Session - 07/26/17 1300    Visit Number  3    Number of Visits  12    Date for PT Re-Evaluation  08/31/17    PT Start Time  1147    PT Stop Time  1230    PT Time Calculation (min)  43 min    Activity Tolerance  Patient tolerated treatment well    Behavior During Therapy  [facility] for tasks assessed/performed       Past Medical History:  Diagnosis Date  . AKI (acute kidney injury) (HCC) 03/20/2015  . Alcohol use with alcohol-induced mood disorder (HCC)    quit almost all ETOH in 07/2014, previously drank 18 to 20 beers daily.   . Alterations of sensations following CVA (cerebrovascular accident) 09/10/2014  . Arthritis   . Colon polyps 2011   Hyperplastic and 1 tubular adenoma.  Dr Loreta AveMann  . 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 identified on TEE at time of stroke  . Embolic stroke involving right middle cerebral artery (HCC) 10/29/2014  . Gout   . Hyperlipidemia   . Hypertension   . Paroxysmal atrial fibrillation (HCC)    a. identified on LINQ 08/2014  . Stroke (HCC) 07/25/14   a. s/p MDT ILR implant.  on xarelto    Past Surgical History:  Procedure Laterality Date  . COLONOSCOPY  2011   Dr Loreta AveMann.   . EP IMPLANTABLE DEVICE N/A 07/27/2014   Procedure: Loop Recorder Insertion;  Surgeon: Marinus MawGregg W Taylor, MD;  [facility];  Service: Cardiovascular;  Laterality: N/A;  . EP IMPLANTABLE DEVICE N/A 12/20/2014   Procedure: Loop Recorder Removal;  Surgeon: Marinus MawGregg W Taylor, MD;  [facility];  Service: Cardiovascular;  Laterality: N/A;  . ESOPHAGOGASTRODUODENOSCOPY N/A 03/21/2015   Procedure: ESOPHAGOGASTRODUODENOSCOPY (EGD);  Surgeon: Jeani HawkingPatrick  Hung, MD;  [facility];  Service: Endoscopy;  Laterality: N/A;  . LOOP RECORDER IMPLANT  07/27/14   LOOP REVEAL LINQ [id number] - [id number]  . TEE WITHOUT CARDIOVERSION N/A 07/27/2014   Procedure: TRANSESOPHAGEAL ECHOCARDIOGRAM (TEE);  Surgeon: Chrystie NoseKenneth C Hilty, MD;  [facility];  Service: Cardiovascular;  Laterality: N/A;    There were no vitals filed for this visit.  Subjective Assessment - 07/26/17 1149    Subjective  shoulder Bruise from hitting the door.  Has been doing the HEP.  I worked it real hard one day and it was starting to feel better.     Pain Descriptors / Indicators  Sore;Throbbing    Pain Type  Chronic pain    Aggravating Factors   hurts at rest    Pain Score  8 up to 9/10    Pain Location  Hip    Pain Orientation  Left;Lateral    Pain Descriptors / Indicators  Numbness    Pain Type  Chronic pain    Pain Frequency  Intermittent    Aggravating Factors   standing walking     Pain Relieving Factors  sitting                       [facility] Adult PT Treatment/Exercise - 07/26/17 0001      Shoulder Exercises:  Supine   External Rotation  AAROM;Left;10 reps    Flexion  AAROM;Both;10 reps      Shoulder Exercises: Standing   Flexion  10 reps UE Ranger    ABduction  10 reps scaption UE ranger AA    Other Standing Exercises  Diagonals with rotation to decrease tone  AA      Shoulder Exercises: Pulleys   Flexion  2 minutes min assit    Scaption  2 minutes min assist      Shoulder Exercises: ROM/Strengthening   UBE (Upper Arm Bike)  3 minutes min assist grip    Other ROM/Strengthening Exercises  UE ranger  sitting  and supine. Constant supervision needed for technique ,  assist and safety      Manual Therapy   Manual Therapy  Joint mobilization;Scapular mobilization;Passive ROM    Joint Mobilization  Gr I-II inferior glides     Passive ROM  all planes to tolerance              PT Education - 07/26/17 1258    Education Details   Exercise form,  diagonals to decrease tone inh hand    Person(s) Educated  Patient    Methods  Explanation;Demonstration;Tactile cues;Verbal cues    Comprehension  Verbalized understanding;Returned demonstration          PT Long Term Goals - 07/20/17 1319      PT LONG TERM GOAL #1   Title  Pt wil be able to raise his arm to 120 deg for reaching with pain no more than 3/10     Time  6    Period  Weeks    Status  New    Target Date  08/31/17      PT LONG TERM GOAL #2   Title  Pt will be able to use L arm for ADLs, normal household activities and pain <3/10.     Time  6    Period  Weeks    Status  New    Target Date  08/31/17      PT LONG TERM GOAL #3   Title  Pt will be able to demo 4+/5 strength in L UE for maximal use during lifting, carrying items while golfing, fishing    Time  6    Period  Weeks    Status  New    Target Date  08/31/17      PT LONG TERM GOAL #4   Title  Pt will be I with HEP for posture, L UE strength and ROM    Time  6    Period  Weeks    Status  New    Target Date  08/31/17            Plan - 07/26/17 1300    Clinical Impression Statement  patient sometimes compliant with HEP.  today he neede mostly hands on assist for exercises  and cues to keep him on task.  No  pai at end of session.  See Flow sheer for ROM.    PT Next Visit Plan  AAROM for L shoulder, strength to tolerance, pulley/UBE , MHP vs ice     PT Home Exercise Plan  wall walks, supine cane (needs help with grip)     Consulted and Agree with Plan of Care  Patient       Patient will benefit from skilled therapeutic intervention in order to improve the following deficits and impairments:     Visit Diagnosis:  Chronic left shoulder pain  Stiffness of left shoulder, not elsewhere classified  Muscle weakness (generalized)     Problem List Patient Active Problem List   Diagnosis Date Noted  . Gout 05/12/2017  . Chronic left shoulder pain 06/13/2015  . Neuropathic pain of  shoulder 06/13/2015  . Acromioclavicular joint arthritis 03/26/2015  . PAF (paroxysmal atrial fibrillation) (HCC) 03/20/2015  . Leukocytosis 03/20/2015  . Hematemesis 03/19/2015  . Depression due to old stroke 02/26/2015  . Hemiparesis and alteration of sensations as late effects of stroke (HCC) 10/29/2014  . Hypersomnia with sleep apnea 10/29/2014  . Nicotine use disorder 10/29/2014  . Left anterior shoulder pain 10/23/2014  . Atrial fibrillation (HCC) 09/06/2014  . Left spastic hemiparesis (HCC) 08/07/2014  . Left-sided neglect 08/07/2014  . History of stroke 08/01/2014  . Tobacco dependence   . Carotid artery occlusion with infarction (HCC)   . History of ETOH abuse   . Hyperlipidemia   . Essential hypertension, benign 10/11/2013    Isatu Macinnes PTA 07/26/2017, 1:04 PM  [facility] [address] [place], [place], [address] Phone: [phone]   Fax:  [phone]  Name: [name] MRN: [id number] Date of Birth: [birth year]

## 2017-07-28 ENCOUNTER — Encounter: Payer: Self-pay | Admitting: Physical Therapy

## 2017-07-28 ENCOUNTER — Ambulatory Visit: Payer: Medicare Other | Admitting: Physical Therapy

## 2017-07-28 DIAGNOSIS — M25612 Stiffness of left shoulder, not elsewhere classified: Secondary | ICD-10-CM

## 2017-07-28 DIAGNOSIS — M25512 Pain in left shoulder: Secondary | ICD-10-CM | POA: Diagnosis not present

## 2017-07-28 DIAGNOSIS — G8929 Other chronic pain: Secondary | ICD-10-CM

## 2017-07-28 DIAGNOSIS — M6281 Muscle weakness (generalized): Secondary | ICD-10-CM | POA: Diagnosis not present

## 2017-07-28 NOTE — Therapy (Signed)
[facility] [address] [place], [place], [address] Phone: [phone]   Fax:  [phone]  Physical Therapy Treatment  Patient Details  Name: Governor SpeckingJerry Sane MRN: [id number] Date of Birth: 1955-09-12 Referring Provider: Dr. Norlene CampbellPeter Whitfield    Encounter Date: 07/28/2017  PT End of Session - 07/28/17 1722    Visit Number  4    Number of Visits  12    Date for PT Re-Evaluation  08/31/17    PT Start Time  1633    PT Stop Time  1714    PT Time Calculation (min)  41 min    Activity Tolerance  Patient tolerated treatment well    Behavior During Therapy  [facility] for tasks assessed/performed       Past Medical History:  Diagnosis Date  . AKI (acute kidney injury) (HCC) 03/20/2015  . Alcohol use with alcohol-induced mood disorder (HCC)    quit almost all ETOH in 07/2014, previously drank 18 to 20 beers daily.   . Alterations of sensations following CVA (cerebrovascular accident) 09/10/2014  . Arthritis   . Colon polyps 2011   Hyperplastic and 1 tubular adenoma.  Dr Loreta AveMann  . 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 identified on TEE at time of stroke  . Embolic stroke involving right middle cerebral artery (HCC) 10/29/2014  . Gout   . Hyperlipidemia   . Hypertension   . Paroxysmal atrial fibrillation (HCC)    a. identified on LINQ 08/2014  . Stroke (HCC) 07/25/14   a. s/p MDT ILR implant.  on xarelto    Past Surgical History:  Procedure Laterality Date  . COLONOSCOPY  2011   Dr Loreta AveMann.   . EP IMPLANTABLE DEVICE N/A 07/27/2014   Procedure: Loop Recorder Insertion;  Surgeon: Marinus MawGregg W Taylor, MD;  [facility];  Service: Cardiovascular;  Laterality: N/A;  . EP IMPLANTABLE DEVICE N/A 12/20/2014   Procedure: Loop Recorder Removal;  Surgeon: Marinus MawGregg W Taylor, MD;  [facility]: [facility];  Service: Cardiovascular;  Laterality: N/A;  . ESOPHAGOGASTRODUODENOSCOPY N/A 03/21/2015   Procedure: ESOPHAGOGASTRODUODENOSCOPY (EGD);  Surgeon: Jeani HawkingPatrick  Hung, MD;  [facility];  Service: Endoscopy;  Laterality: N/A;  . LOOP RECORDER IMPLANT  07/27/14   LOOP REVEAL LINQ [id number] - [id number]  . TEE WITHOUT CARDIOVERSION N/A 07/27/2014   Procedure: TRANSESOPHAGEAL ECHOCARDIOGRAM (TEE);  Surgeon: Chrystie NoseKenneth C Hilty, MD;  [facility];  Service: Cardiovascular;  Laterality: N/A;    There were no vitals filed for this visit.  Subjective Assessment - 07/28/17 1655    Subjective  Pain about the same         [facility] PT Assessment - 07/28/17 0001      PROM   Overall PROM Comments  120 shoulder flexion                   [facility] Adult PT Treatment/Exercise - 07/28/17 0001      Shoulder Exercises: Supine   External Rotation  AAROM;Left;10 reps    Flexion  AAROM;Both;10 reps      Shoulder Exercises: Standing   Flexion  10 reps UE Ranger    ABduction  10 reps scaption UE ranger AA      Shoulder Exercises: Pulleys   Flexion  2 minutes    Scaption  2 minutes SBA        Shoulder Exercises: ROM/Strengthening   Other ROM/Strengthening Exercises  UR Ranger AA close Supervision      Manual Therapy  Manual Therapy  Joint mobilization;Scapular mobilization;Passive ROM    Joint Mobilization  Gr I-II inferior glides     Passive ROM  all planes to tolerance              PT Education - 07/28/17 1722    Education Details  Exercise form    Person(s) Educated  Patient    Methods  Explanation;Demonstration;Tactile cues;Verbal cues    Comprehension  Verbalized understanding;Returned demonstration          PT Long Term Goals - 07/20/17 1319      PT LONG TERM GOAL #1   Title  Pt wil be able to raise his arm to 120 deg for reaching with pain no more than 3/10     Time  6    Period  Weeks    Status  New    Target Date  08/31/17      PT LONG TERM GOAL #2   Title  Pt will be able to use L arm for ADLs, normal household activities and pain <3/10.     Time  6    Period  Weeks    Status  New    Target Date   08/31/17      PT LONG TERM GOAL #3   Title  Pt will be able to demo 4+/5 strength in L UE for maximal use during lifting, carrying items while golfing, fishing    Time  6    Period  Weeks    Status  New    Target Date  08/31/17      PT LONG TERM GOAL #4   Title  Pt will be I with HEP for posture, L UE strength and ROM    Time  6    Period  Weeks    Status  New    Target Date  08/31/17            Plan - 07/28/17 1723    Clinical Impression Statement  ROM main focus . patiewnt able to remain on task.  PROM 120 flexion abd almost 90,  No pain during session.     PT Next Visit Plan  AAROM for L shoulder, strength to tolerance, pulley/UBE , MHP vs ice     PT Home Exercise Plan  wall walks, supine cane (needs help with grip)     Consulted and Agree with Plan of Care  Patient       Patient will benefit from skilled therapeutic intervention in order to improve the following deficits and impairments:     Visit Diagnosis: Chronic left shoulder pain  Stiffness of left shoulder, not elsewhere classified  Muscle weakness (generalized)     Problem List Patient Active Problem List   Diagnosis Date Noted  . Gout 05/12/2017  . Chronic left shoulder pain 06/13/2015  . Neuropathic pain of shoulder 06/13/2015  . Acromioclavicular joint arthritis 03/26/2015  . PAF (paroxysmal atrial fibrillation) (HCC) 03/20/2015  . Leukocytosis 03/20/2015  . Hematemesis 03/19/2015  . Depression due to old stroke 02/26/2015  . Hemiparesis and alteration of sensations as late effects of stroke (HCC) 10/29/2014  . Hypersomnia with sleep apnea 10/29/2014  . Nicotine use disorder 10/29/2014  . Left anterior shoulder pain 10/23/2014  . Atrial fibrillation (HCC) 09/06/2014  . Left spastic hemiparesis (HCC) 08/07/2014  . Left-sided neglect 08/07/2014  . History of stroke 08/01/2014  . Tobacco dependence   . Carotid artery occlusion with infarction (HCC)   . History of ETOH  abuse   .  Hyperlipidemia   . Essential hypertension, benign 10/11/2013    HARRIS,KAREN PTA 07/28/2017, 5:25 PM  [facility] [address] [place], [place], [address] Phone: [phone]   Fax:  [phone]  Name: Khaled Herda MRN: [id number] Date of Birth: 10-Aug-1955

## 2017-08-02 ENCOUNTER — Ambulatory Visit (INDEPENDENT_AMBULATORY_CARE_PROVIDER_SITE_OTHER): Payer: Self-pay

## 2017-08-02 ENCOUNTER — Ambulatory Visit (INDEPENDENT_AMBULATORY_CARE_PROVIDER_SITE_OTHER): Payer: Medicare Other | Admitting: Orthopaedic Surgery

## 2017-08-02 ENCOUNTER — Encounter (INDEPENDENT_AMBULATORY_CARE_PROVIDER_SITE_OTHER): Payer: Self-pay | Admitting: Orthopaedic Surgery

## 2017-08-02 ENCOUNTER — Other Ambulatory Visit (INDEPENDENT_AMBULATORY_CARE_PROVIDER_SITE_OTHER): Payer: Self-pay | Admitting: *Deleted

## 2017-08-02 VITALS — BP 110/58 | HR 64 | Resp 16 | Ht 66.0 in | Wt 145.0 lb

## 2017-08-02 DIAGNOSIS — M79605 Pain in left leg: Secondary | ICD-10-CM

## 2017-08-02 DIAGNOSIS — M4807 Spinal stenosis, lumbosacral region: Secondary | ICD-10-CM

## 2017-08-02 DIAGNOSIS — M7502 Adhesive capsulitis of left shoulder: Secondary | ICD-10-CM | POA: Diagnosis not present

## 2017-08-02 NOTE — Progress Notes (Signed)
Office Visit Note   Patient: [name] SpeckingJerry Layne           Date of Birth: [birth year]           MRN: [id number] Visit Date: 08/02/2017              Requested by: Porfirio OarJeffery, Chelle, PA-C No address on file PCP: Porfirio OarJeffery, Chelle, PA-C   Assessment & Plan: Visit Diagnoses:  1. Pain in left leg     Plan: [name] has been followed for the adhesive capsulitis of his left shoulder.  I believe this is a combination of a prior CVA on the left side associated with some mild degenerative changes.  He is been going to physical therapy and feels like he is "less stiff".  He also relates that he has had some long standing problem with his left lower extremity.  He believes some of this may be related to his stroke feels that he had some of the similar symptoms even prior to his CVA several years ago.  He relates having difficulty when he stands or walks for any distance and has to stop and rest.  Does have a history of tobacco abuse coronary artery disease.  Will obtain MRI scan lumbar spine.  If results negative for stenosis.  We will obtain vascular consult.  Office visit over 30 minutes  Follow-Up Instructions: No follow-ups on file.   Orders:  Orders Placed This Encounter  Procedures  . XR Pelvis 1-2 Views  . XR Lumbar Spine 2-3 Views   No orders of the defined types were placed in this encounter.     Procedures: No procedures performed   Clinical Data: No additional findings.   Subjective: Chief Complaint  Patient presents with  . Left Shoulder - Pain  . Shoulder Pain    Left shoulder pain x 3 years since stroke, no injury, no surgery to left shoulder, limited range of motion, numbness in left hand, weakness  [name] relates that he is much better in regards to his left shoulder in terms of the stiffness.  He had a cortisone injection about 6 weeks ago that also "help.  He is been going to physical therapy.  Has residual problems with his left leg and left upper extremity as a result of  his CVA several years ago.  Prior to his CVA he had some pain in his left leg that was somewhat "nondescript.  Even now he has difficulty when he stands or walks any distance with pain in his left leg.  When he rests he "feels better".  HPI  Review of Systems  Constitutional: Positive for activity change.  HENT: Negative for trouble swallowing.   Eyes: Negative for pain.  Respiratory: Negative for shortness of breath.   Cardiovascular: Negative for leg swelling.  Gastrointestinal: Negative for constipation.  Endocrine: Positive for cold intolerance.  Genitourinary: Negative for difficulty urinating.  Musculoskeletal: Negative for joint swelling.  Skin: Negative for rash.  Allergic/Immunologic: Negative for food allergies.  Neurological: Positive for weakness and numbness.  Psychiatric/Behavioral: Negative for sleep disturbance.     Objective: Vital Signs: BP (!) 110/58 (BP Location: Right Arm, Patient Position: Sitting, Cuff Size: Normal)   Pulse 64   Resp 16   Ht 5\' 6"  (1.676 m)   Wt 145 lb (65.8 kg)   BMI 23.40 kg/m   Physical Exam  Ortho Exam awake alert and oriented x3.  Comfortable sitting.  No obvious shortness of breath or chest pain.  Posturing  of his left leg.  Increased reflexes consistent with his prior stroke.  Does have some mild stiffness with range of motion of his left hip but no significant pain.  Straight leg raise is negative.  No percussible tenderness of his lumbar spine.  Both feet are cool.  Did not feel good pulses in his left foot.  Specialty Comments:  No specialty comments available.  Imaging: No results found.   PMFS History: Patient Active Problem List   Diagnosis Date Noted  . Gout 05/12/2017  . Chronic left shoulder pain 06/13/2015  . Neuropathic pain of shoulder 06/13/2015  . Acromioclavicular joint arthritis 03/26/2015  . PAF (paroxysmal atrial fibrillation) (HCC) 03/20/2015  . Leukocytosis 03/20/2015  . Hematemesis 03/19/2015  .  Depression due to old stroke 02/26/2015  . Hemiparesis and alteration of sensations as late effects of stroke (HCC) 10/29/2014  . Hypersomnia with sleep apnea 10/29/2014  . Nicotine use disorder 10/29/2014  . Left anterior shoulder pain 10/23/2014  . Atrial fibrillation (HCC) 09/06/2014  . Left spastic hemiparesis (HCC) 08/07/2014  . Left-sided neglect 08/07/2014  . History of stroke 08/01/2014  . Tobacco dependence   . Carotid artery occlusion with infarction (HCC)   . History of ETOH abuse   . Hyperlipidemia   . Essential hypertension, benign 10/11/2013   Past Medical History:  Diagnosis Date  . AKI (acute kidney injury) (HCC) 03/20/2015  . Alcohol use with alcohol-induced mood disorder (HCC)    quit almost all ETOH in 07/2014, previously drank 18 to 20 beers daily.   . Alterations of sensations following CVA (cerebrovascular accident) 09/10/2014  . Arthritis   . Colon polyps 2011   Hyperplastic and 1 tubular adenoma.  Dr Loreta Ave  . 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 identified on TEE at time of stroke  . Embolic stroke involving right middle cerebral artery (HCC) 10/29/2014  . Gout   . Hyperlipidemia   . Hypertension   . Paroxysmal atrial fibrillation (HCC)    a. identified on LINQ 08/2014  . Stroke (HCC) 07/25/14   a. s/p MDT ILR implant.  on xarelto    Family History  Problem Relation Age of Onset  . Cancer Father        leukemia  . Heart attack Mother   . Heart disease Brother   . Heart attack Brother 64       CABG, stenting  . Hypertension Brother   . Alcohol abuse Brother     Past Surgical History:  Procedure Laterality Date  . COLONOSCOPY  2011   Dr Loreta Ave.   . EP IMPLANTABLE DEVICE N/A 07/27/2014   Procedure: Loop Recorder Insertion;  Surgeon: Marinus Maw, MD;  [facility];  Service: Cardiovascular;  Laterality: N/A;  . EP IMPLANTABLE DEVICE N/A 12/20/2014   Procedure: Loop Recorder Removal;  Surgeon: Marinus Maw, MD;  [facility];   Service: Cardiovascular;  Laterality: N/A;  . ESOPHAGOGASTRODUODENOSCOPY N/A 03/21/2015   Procedure: ESOPHAGOGASTRODUODENOSCOPY (EGD);  Surgeon: Jeani Hawking, MD;  [facility]OSCOPY;  Service: Endoscopy;  Laterality: N/A;  . LOOP RECORDER IMPLANT  07/27/14   LOOP REVEAL LINQ [id number] - [id number]  . TEE WITHOUT CARDIOVERSION N/A 07/27/2014   Procedure: TRANSESOPHAGEAL ECHOCARDIOGRAM (TEE);  Surgeon: Chrystie Nose, MD;  [facility];  Service: Cardiovascular;  Laterality: N/A;   Social History   Occupational History  . Occupation: former Merchandiser, retail    Comment: disabled due to CVA  Tobacco Use  .  Smoking status: Current Every Day Smoker    Packs/day: 0.50    Years: 40.00    Pack years: 20.00    Types: Cigarettes  . Smokeless tobacco: Never Used  . Tobacco comment: cut back after stroke, but increased again due to depression  Substance and Sexual Activity  . Alcohol use: Not Currently  . Drug use: Yes    Types: Marijuana    Comment: trying to help his appetite  . Sexual activity: Yes    Partners: Female

## 2017-08-04 ENCOUNTER — Encounter: Payer: Self-pay | Admitting: Physical Therapy

## 2017-08-04 ENCOUNTER — Ambulatory Visit: Payer: Medicare Other | Admitting: Physical Therapy

## 2017-08-04 DIAGNOSIS — G8929 Other chronic pain: Secondary | ICD-10-CM | POA: Diagnosis not present

## 2017-08-04 DIAGNOSIS — M25612 Stiffness of left shoulder, not elsewhere classified: Secondary | ICD-10-CM | POA: Diagnosis not present

## 2017-08-04 DIAGNOSIS — M25512 Pain in left shoulder: Principal | ICD-10-CM

## 2017-08-04 DIAGNOSIS — M6281 Muscle weakness (generalized): Secondary | ICD-10-CM

## 2017-08-04 NOTE — Therapy (Signed)
[facility] [address] [place], [place], [address] Phone: [phone]   Fax:  [phone]  Physical Therapy Treatment  Patient Details  Name: [name] SpeckingJerry [name] MRN: [id number] Date of Birth: 08-07-1955 Referring Provider: Dr. Norlene CampbellPeter Whitfield    Encounter Date: 08/04/2017  PT End of Session - 08/04/17 0836    Visit Number  5    Number of Visits  12    Date for PT Re-Evaluation  08/31/17    PT Start Time  0730    PT Stop Time  0820    PT Time Calculation (min)  50 min    Activity Tolerance  Patient tolerated treatment well    Behavior During Therapy  [facility] for tasks assessed/performed       Past Medical History:  Diagnosis Date  . AKI (acute kidney injury) (HCC) 03/20/2015  . Alcohol use with alcohol-induced mood disorder (HCC)    quit almost all ETOH in 07/2014, previously drank 18 to 20 beers daily.   . Alterations of sensations following CVA (cerebrovascular accident) 09/10/2014  . Arthritis   . Colon polyps 2011   Hyperplastic and 1 tubular adenoma.  Dr Loreta AveMann  . 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 identified on TEE at time of stroke  . Embolic stroke involving right middle cerebral artery (HCC) 10/29/2014  . Gout   . Hyperlipidemia   . Hypertension   . Paroxysmal atrial fibrillation (HCC)    a. identified on LINQ 08/2014  . Stroke (HCC) 07/25/14   a. s/p MDT ILR implant.  on xarelto    Past Surgical History:  Procedure Laterality Date  . COLONOSCOPY  2011   Dr Loreta AveMann.   . EP IMPLANTABLE DEVICE N/A 07/27/2014   Procedure: Loop Recorder Insertion;  Surgeon: Marinus MawGregg W Taylor, MD;  [facility];  Service: Cardiovascular;  Laterality: N/A;  . EP IMPLANTABLE DEVICE N/A 12/20/2014   Procedure: Loop Recorder Removal;  Surgeon: Marinus MawGregg W Taylor, MD;  [facility];  Service: Cardiovascular;  Laterality: N/A;  . ESOPHAGOGASTRODUODENOSCOPY N/A 03/21/2015   Procedure: ESOPHAGOGASTRODUODENOSCOPY (EGD);  Surgeon: Jeani HawkingPatrick  Hung, MD;  [facility];  Service: Endoscopy;  Laterality: N/A;  . LOOP RECORDER IMPLANT  07/27/14   LOOP REVEAL LINQ [id number] - [id number]  . TEE WITHOUT CARDIOVERSION N/A 07/27/2014   Procedure: TRANSESOPHAGEAL ECHOCARDIOGRAM (TEE);  Surgeon: Chrystie NoseKenneth C Hilty, MD;  [facility];  Service: Cardiovascular;  Laterality: N/A;    There were no vitals filed for this visit.  Subjective Assessment - 08/04/17 0735    Subjective  Saw MD Monday he was plased with shoulder.  He has ordered an MRI for left hip.  he has been doing the exercises    Currently in Pain?  Yes    Pain Score  4     Pain Location  Shoulder    Pain Orientation  Left    Pain Descriptors / Indicators  Throbbing    Pain Frequency  Constant    Aggravating Factors   cold,  rain  throbs all the time    Pain Relieving Factors  Medicine cream,      Multiple Pain Sites  Yes    Pain Score  4    Pain Location  Hip    Pain Orientation  Left;Lateral    Pain Descriptors / Indicators  Pressure    Pain Type  Chronic pain    Pain Frequency  Constant    Aggravating Factors   standing  walking    Pain Relieving Factors  sitting    Effect of Pain on Daily Activities  limits standing         [facility] PT Assessment - 08/04/17 0001      PROM   Overall PROM Comments  130 shoulder flexion supine cane with 4 LB weight assist                   [facility] Adult PT Treatment/Exercise - 08/04/17 0001      Therapeutic Activites    Therapeutic Activities  Other Therapeutic Activities    Other Therapeutic Activities  simulate golf swing with dowel      Shoulder Exercises: Supine   Protraction  10 reps cane and 4 LBS,  chest press    Protraction Weight (lbs)  4    Horizontal ABduction  -- cane 4 LBS CGA 3 X ,  4 LBS  able to grip cane    Horizontal ABduction Weight (lbs)  4    External Rotation  10 reps min assist initially,  cane with 4 LB ankle weight    External Rotation Limitations  2 sets,  1  set AA with cane only,   stretch    Flexion  AROM    Other Supine Exercises  elbow flex/  extension 10 X 10  2 LBS      Shoulder Exercises: Seated   Diagonals  AROM    Diagonals Limitations  PNF patterns both to decrease arm tone min assist ,  both      Shoulder Exercises: Standing   Flexion  10 reps wall slide      Shoulder Exercises: Pulleys   Flexion  2 minutes    Scaption  2 minutes SBA        Shoulder Exercises: Stretch   Other Shoulder Stretches  seated trunk rotation starting head and working to hips 5 X mod cues initially to decrease tone    Other Shoulder Stretches  Lower trunk rotation with opposite head movement to decrease tone      Moist Heat Therapy   Moist Heat Location  Shoulder moving to different areas shoulder, concurrent with manual      Manual Therapy   Manual Therapy  Joint mobilization;Scapular mobilization;Passive ROM    Joint Mobilization  Gr I-II inferior glides     Passive ROM  all planes to tolerance  using rotation to decrease tone.             PT Education - 08/04/17 0831    Education Details  How to decrease tone.     Person(s) Educated  Patient    Methods  Explanation    Comprehension  Verbalized understanding          PT Long Term Goals - 08/04/17 0840      PT LONG TERM GOAL #1   Title  Pt wil be able to raise his arm to 120 deg for reaching with pain no more than 3/10     Baseline  113 prior to session    Time  6    Period  Weeks    Status  On-going      PT LONG TERM GOAL #2   Title  Pt will be able to use L arm for ADLs, normal household activities and pain <3/10.     Baseline  4/10 pain with ADL's.  He says he does not do much at home.    Time  6  Period  Weeks    Status  On-going      PT LONG TERM GOAL #3   Title  Pt will be able to demo 4+/5 strength in L UE for maximal use during lifting, carrying items while golfing, fishing    Baseline  Able to work with some light weights    Time  6    Period  Weeks    Status  On-going      PT  LONG TERM GOAL #4   Title  Pt will be I with HEP for posture, L UE strength and ROM    Baseline  says he is doing    Time  6    Period  Weeks    Status  On-going            Plan - 08/04/17 1610    Clinical Impression Statement  AROM flexion 114 at beginning of session.  AAROM 130+ in supine.  Strengthening and ROM focus today.  patient able to simulate golf swing. with noted improved grip and trunk rotation and arm movement ( and grip)through follow through phase of swing.     PT Next Visit Plan  AAROM for L shoulder, strength to tolerance, pulley/UBE , MHP vs ice ,   Update HEP,  Continue arm rotations    PT Home Exercise Plan  wall walks, supine cane ,  practice swinging golf club.    Consulted and Agree with Plan of Care  Patient       Patient will benefit from skilled therapeutic intervention in order to improve the following deficits and impairments:     Visit Diagnosis: Chronic left shoulder pain  Stiffness of left shoulder, not elsewhere classified  Muscle weakness (generalized)     Problem List Patient Active Problem List   Diagnosis Date Noted  . Adhesive capsulitis of left shoulder 08/02/2017  . Pain in left leg 08/02/2017  . Gout 05/12/2017  . Chronic left shoulder pain 06/13/2015  . Neuropathic pain of shoulder 06/13/2015  . Acromioclavicular joint arthritis 03/26/2015  . PAF (paroxysmal atrial fibrillation) (HCC) 03/20/2015  . Leukocytosis 03/20/2015  . Hematemesis 03/19/2015  . Depression due to old stroke 02/26/2015  . Hemiparesis and alteration of sensations as late effects of stroke (HCC) 10/29/2014  . Hypersomnia with sleep apnea 10/29/2014  . Nicotine use disorder 10/29/2014  . Left anterior shoulder pain 10/23/2014  . Atrial fibrillation (HCC) 09/06/2014  . Left spastic hemiparesis (HCC) 08/07/2014  . Left-sided neglect 08/07/2014  . History of stroke 08/01/2014  . Tobacco dependence   . Carotid artery occlusion with infarction (HCC)   .  History of ETOH abuse   . Hyperlipidemia   . Essential hypertension, benign 10/11/2013    [facility] PTA 08/04/2017, 8:43 AM  [facility] [address] [place], [place], [address] Phone: [phone]   Fax:  [phone]  Name: [name] MRN: [id number] Date of Birth: 02-Apr-1955

## 2017-08-06 NOTE — Telephone Encounter (Signed)
Disregard/sent in error

## 2017-08-09 ENCOUNTER — Encounter: Payer: Self-pay | Admitting: Physical Therapy

## 2017-08-09 ENCOUNTER — Ambulatory Visit: Payer: Medicare Other | Admitting: Physical Therapy

## 2017-08-09 DIAGNOSIS — M6281 Muscle weakness (generalized): Secondary | ICD-10-CM

## 2017-08-09 DIAGNOSIS — M25612 Stiffness of left shoulder, not elsewhere classified: Secondary | ICD-10-CM | POA: Diagnosis not present

## 2017-08-09 DIAGNOSIS — M25512 Pain in left shoulder: Secondary | ICD-10-CM | POA: Diagnosis not present

## 2017-08-09 DIAGNOSIS — G8929 Other chronic pain: Secondary | ICD-10-CM

## 2017-08-09 NOTE — Therapy (Signed)
[facility] Outpatient Rehabilitation [facility] [address] [place], [place], [address] Phone: [phone]   Fax:  [phone]  Physical Therapy Treatment  Patient Details  Name: [name] MRN: [id number] Date of Birth: [birth year] Referring Provider: Dr. Norlene Campbell    Encounter Date: 08/09/2017  PT End of Session - 08/09/17 0930    Visit Number  6    Number of Visits  12    Date for PT Re-Evaluation  08/31/17    PT Start Time  0845    PT Stop Time  0926    PT Time Calculation (min)  41 min    Activity Tolerance  Patient tolerated treatment well    Behavior During Therapy  [facility] for tasks assessed/performed       Past Medical History:  Diagnosis Date  . AKI (acute kidney injury) (HCC) 03/20/2015  . Alcohol use with alcohol-induced mood disorder (HCC)    quit almost all ETOH in 07/2014, previously drank 18 to 20 beers daily.   . Alterations of sensations following CVA (cerebrovascular accident) 09/10/2014  . Arthritis   . Colon polyps 2011   Hyperplastic and 1 tubular adenoma.  Dr Loreta Ave  . 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 identified on TEE at time of stroke  . Embolic stroke involving right middle cerebral artery (HCC) 10/29/2014  . Gout   . Hyperlipidemia   . Hypertension   . Paroxysmal atrial fibrillation (HCC)    a. identified on LINQ 08/2014  . Stroke (HCC) 07/25/14   a. s/p MDT ILR implant.  on xarelto    Past Surgical History:  Procedure Laterality Date  . COLONOSCOPY  2011   Dr Loreta Ave.   . EP IMPLANTABLE DEVICE N/A 07/27/2014   Procedure: Loop Recorder Insertion;  Surgeon: Marinus Maw, MD;  [facility]: [facility] INVASIVE CV LAB;  Service: Cardiovascular;  Laterality: N/A;  . EP IMPLANTABLE DEVICE N/A 12/20/2014   Procedure: Loop Recorder Removal;  Surgeon: Marinus Maw, MD;  Location: [facility] INVASIVE CV LAB;  Service: Cardiovascular;  Laterality: N/A;  . ESOPHAGOGASTRODUODENOSCOPY N/A 03/21/2015   Procedure: ESOPHAGOGASTRODUODENOSCOPY (EGD);  Surgeon: Jeani Hawking, MD;  Location: [facility] ENDOSCOPY;  Service: Endoscopy;  Laterality: N/A;  . LOOP RECORDER IMPLANT  07/27/14   LOOP REVEAL LINQ [id number] - [id number]  . TEE WITHOUT CARDIOVERSION N/A 07/27/2014   Procedure: TRANSESOPHAGEAL ECHOCARDIOGRAM (TEE);  Surgeon: Chrystie Nose, MD;  Location: [facility] ENDOSCOPY;  Service: Cardiovascular;  Laterality: N/A;    There were no vitals filed for this visit.  Subjective Assessment - 08/09/17 0849    Subjective  No MRI appointment for left hip.  He has been doing the exercises with the cane in the bed.    Currently in Pain?  No/denies    Pain Location  Shoulder    Pain Orientation  Left    Aggravating Factors   sitting poorly    Pain Relieving Factors  medicine,  cream    Pain Score  4    Pain Location  Hip    Pain Orientation  Left;Lateral    Pain Descriptors / Indicators  Pressure Feels Loike i am walking on a ball.    Pain Type  Chronic pain    Aggravating Factors   walking    Pain Relieving Factors  sitting    Effect of Pain on Daily Activities  limits activity                       [facility]  Adult PT Treatment/Exercise - 08/09/17 0001      Shoulder Exercises: Supine   Protraction  10 reps;AAROM    Horizontal ABduction  10 reps;AAROM    External Rotation  10 reps;AAROM    Internal Rotation  10 reps;AAROM    Diagonals  10 reps;PROM      Shoulder Exercises: Standing   Internal Rotation  10 reps sliding cane up behind back    Flexion  10 reps    Extension  10 reps cane cued initially      Shoulder Exercises: Pulleys   Flexion  2 minutes    Scaption  2 minutes SBA        Shoulder Exercises: Stretch   Internal Rotation Stretch  10 seconds 10 X mod cues  sleeper's stretch HEP    External Rotation Stretch  10 seconds anterior capsule stretch mos assist did not add to HEP  ,    Other Shoulder Stretches  seated trunk rotation starting head and working to hips 5 X mod cues initially to decrease tone posterior capsule stretch    Other  Shoulder Stretches  Lower trunk rotation with opposite head movement to decrease tone      Moist Heat Therapy   Moist Heat Location  Shoulder moving to different areas shoulder, concurrent with manual      Manual Therapy   Manual Therapy  Joint mobilization;Scapular mobilization;Passive ROM    Joint Mobilization  Gr I-II inferior glides     Passive ROM  all planes to tolerance  using rotation to decrease tone.             PT Education - 08/09/17 0907    Education Details  HEP,  shoulder anatomy    Person(s) Educated  Patient    Methods  Explanation;Tactile cues;Verbal cues;Handout    Comprehension  Verbalized understanding;Returned demonstration;Need further instruction          PT Long Term Goals - 08/09/17 1039      PT LONG TERM GOAL #1   Title  Pt wil be able to raise his arm to 120 deg for reaching with pain no more than 3/10     Baseline  120 AA    Time  6    Period  Weeks    Status  On-going      PT LONG TERM GOAL #2   Title  Pt will be able to use L arm for ADLs, normal household activities and pain <3/10.     Baseline  uses arm some    Time  6    Period  Weeks    Status  On-going      PT LONG TERM GOAL #3   Title  Pt will be able to demo 4+/5 strength in L UE for maximal use during lifting, carrying items while golfing, fishing    Time  6    Period  Weeks    Status  Unable to assess      PT LONG TERM GOAL #4   Title  Pt will be I with HEP for posture, L UE strength and ROM    Baseline  says he is doing    Time  6    Period  Weeks    Status  On-going            Plan - 08/09/17 0930    Clinical Impression Statement  120 shoulder flexion supine with cane .  Pain joint 3/10 post session.  patient declined the  need for modalities. Able to progress HEP.    PT Next Visit Plan  AAROM for L shoulder, strength to tolerance, pulley/UBE , MHP vs ice ,   Update HEP,  Continue arm rotations    PT Home Exercise Plan  wall walks, supine cane ,  practice  swinging golf club.    Consulted and Agree with Plan of Care  Patient       Patient will benefit from skilled therapeutic intervention in order to improve the following deficits and impairments:     Visit Diagnosis: Chronic left shoulder pain  Stiffness of left shoulder, not elsewhere classified  Muscle weakness (generalized)     Problem List Patient Active Problem List   Diagnosis Date Noted  . Adhesive capsulitis of left shoulder 08/02/2017  . Pain in left leg 08/02/2017  . Gout 05/12/2017  . Chronic left shoulder pain 06/13/2015  . Neuropathic pain of shoulder 06/13/2015  . Acromioclavicular joint arthritis 03/26/2015  . PAF (paroxysmal atrial fibrillation) (HCC) 03/20/2015  . Leukocytosis 03/20/2015  . Hematemesis 03/19/2015  . Depression due to old stroke 02/26/2015  . Hemiparesis and alteration of sensations as late effects of stroke (HCC) 10/29/2014  . Hypersomnia with sleep apnea 10/29/2014  . Nicotine use disorder 10/29/2014  . Left anterior shoulder pain 10/23/2014  . Atrial fibrillation (HCC) 09/06/2014  . Left spastic hemiparesis (HCC) 08/07/2014  . Left-sided neglect 08/07/2014  . History of stroke 08/01/2014  . Tobacco dependence   . Carotid artery occlusion with infarction (HCC)   . History of ETOH abuse   . Hyperlipidemia   . Essential hypertension, benign 10/11/2013    [name] PTA 08/09/2017, 1:27 PM  [facility] [address] [place], [place], [address] Phone: [phone]   Fax:  [phone]  Name: [name] MRN: [id number] Date of Birth: 10-08-1955

## 2017-08-09 NOTE — Patient Instructions (Signed)
Posterior Capsule Sleeper Stretch, Side-Lying    Lie on side, pillow under head, neck in neutral, underside arm in 90-90 of shoulder and elbow flexion with scapula fixed to table. Use other hand to press back of underside arm forward and downward. Keep elbow angle. Hold __15 to 30_ seconds.  Repeat ___ times per session. Do _1-2__ sessions per day.   Copyright  VHI. All rights reserved.

## 2017-08-12 ENCOUNTER — Ambulatory Visit
Admission: RE | Admit: 2017-08-12 | Discharge: 2017-08-12 | Disposition: A | Discharge status: Home / Self Care | Payer: Medicare (Managed Care) | Source: Ambulatory Visit | Attending: Orthopaedic Surgery | Admitting: Orthopaedic Surgery

## 2017-08-12 DIAGNOSIS — M4807 Spinal stenosis, lumbosacral region: Secondary | ICD-10-CM

## 2017-08-13 ENCOUNTER — Ambulatory Visit: Payer: Medicare Other | Admitting: Physical Therapy

## 2017-08-13 ENCOUNTER — Encounter: Payer: Self-pay | Admitting: Physical Therapy

## 2017-08-13 DIAGNOSIS — M25612 Stiffness of left shoulder, not elsewhere classified: Secondary | ICD-10-CM | POA: Diagnosis not present

## 2017-08-13 DIAGNOSIS — M6281 Muscle weakness (generalized): Secondary | ICD-10-CM

## 2017-08-13 DIAGNOSIS — M25512 Pain in left shoulder: Secondary | ICD-10-CM | POA: Diagnosis not present

## 2017-08-13 DIAGNOSIS — G8929 Other chronic pain: Secondary | ICD-10-CM

## 2017-08-13 NOTE — Patient Instructions (Signed)
SHOULDER: Extension (Band)    Start with arm slightly forward. Holding band, pull backward, past hip, keeping elbow straight. Do not swing arm. Hold __5_ seconds. Use red ________ band. __10-20-_ reps per set, ___1 sets per day, _7__ days per week  Copyright  VHI. All rights reserved.    Low Row: Standing    Face anchor, feet shoulder width apart. Palms up, pull arms back, squeezing shoulder blades together. Repeat _10-20_ times per set. Do _1_ sets per session. Do _7_ sessions per week. Anchor Height: Waist  http://tub.exer.us/65   Copyright  VHI. All rights reserved.    Wall Push-Up: Double Arm    Stand __1_ feet from wall with both hands on wall. Perform a push-up. Repeat __10_ times per set. Rest __15_ seconds after set. Do __3_ sets per session.  http://plyo.exer.us/182   Copyright  VHI. All rights reserved.

## 2017-08-13 NOTE — Therapy (Signed)
[facility] Outpatient Rehabilitation [facility] [address] [place], [place], [address] Phone: [phone]   Fax:  [phone]  Physical Therapy Treatment  Patient Details  Name: [name] MRN: [id number] Date of Birth: [birth year] Referring Provider: Dr. Norlene Campbell    Encounter Date: 08/13/2017  PT End of Session - 08/13/17 0906    Visit Number  7    Number of Visits  12    Date for PT Re-Evaluation  08/31/17    PT Start Time  0845    PT Stop Time  0938    PT Time Calculation (min)  53 min    Activity Tolerance  Patient tolerated treatment well    Behavior During Therapy  [facility] for tasks assessed/performed       Past Medical History:  Diagnosis Date  . AKI (acute kidney injury) (HCC) 03/20/2015  . Alcohol use with alcohol-induced mood disorder (HCC)    quit almost all ETOH in 07/2014, previously drank 18 to 20 beers daily.   . Alterations of sensations following CVA (cerebrovascular accident) 09/10/2014  . Arthritis   . Colon polyps 2011   Hyperplastic and 1 tubular adenoma.  Dr Loreta Ave  . 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 identified on TEE at time of stroke  . Embolic stroke involving right middle cerebral artery (HCC) 10/29/2014  . Gout   . Hyperlipidemia   . Hypertension   . Paroxysmal atrial fibrillation (HCC)    a. identified on LINQ 08/2014  . Stroke (HCC) 07/25/14   a. s/p MDT ILR implant.  on xarelto    Past Surgical History:  Procedure Laterality Date  . COLONOSCOPY  2011   Dr Loreta Ave.   . EP IMPLANTABLE DEVICE N/A 07/27/2014   Procedure: Loop Recorder Insertion;  Surgeon: Marinus Maw, MD;  Location: [facility] INVASIVE CV LAB;  Service: Cardiovascular;  Laterality: N/A;  . EP IMPLANTABLE DEVICE N/A 12/20/2014   Procedure: Loop Recorder Removal;  Surgeon: Marinus Maw, MD;  Location: [facility] INVASIVE CV LAB;  Service: Cardiovascular;  Laterality: N/A;  . ESOPHAGOGASTRODUODENOSCOPY N/A 03/21/2015   Procedure: ESOPHAGOGASTRODUODENOSCOPY (EGD);  Surgeon: Jeani Hawking, MD;  Location: [facility] ENDOSCOPY;  Service: Endoscopy;  Laterality: N/A;  . LOOP RECORDER IMPLANT  07/27/14   LOOP REVEAL LINQ [id number] - [id number]  . loop recorder removed 2017    . TEE WITHOUT CARDIOVERSION N/A 07/27/2014   Procedure: TRANSESOPHAGEAL ECHOCARDIOGRAM (TEE);  Surgeon: Chrystie Nose, MD;  Location: [facility] ENDOSCOPY;  Service: Cardiovascular;  Laterality: N/A;    There were no vitals filed for this visit.  Subjective Assessment - 08/13/17 0849    Subjective  Had MRI in L spine yesterday.  Patient reports L shoulder improvements functionally.  He is playing golf today.      Currently in Pain?  Yes    Pain Score  5     Pain Location  Shoulder    Pain Orientation  Left    Pain Descriptors / Indicators  Aching    Pain Type  Chronic pain    Pain Onset  More than a month ago    Pain Frequency  Constant    Pain Score  6    Pain Location  Hip    Pain Orientation  Left;Lateral    Pain Descriptors / Indicators  Aching    Pain Type  Chronic pain    Pain Onset  More than a month ago    Pain Frequency  Constant    Aggravating Factors  walking, standing     Pain Relieving Factors  sitting          [facility] PT Assessment - 08/13/17 0001      AROM   Left Shoulder Flexion  110 Degrees    Left Shoulder ABduction  100 Degrees      Strength   Left Shoulder Flexion  3+/5    Left Shoulder ABduction  3+/5                   [facility] Adult PT Treatment/Exercise - 08/13/17 0001      Self-Care   Self-Care  Heat/Ice Application;Other Self-Care Comments    Other Self-Care Comments   HEP, strength vs stretch       Shoulder Exercises: Supine   Horizontal ABduction  AAROM;Both;15 reps    External Rotation  10 reps;AAROM    Flexion  AROM;Both;15 reps    Other Supine Exercises  supine yellow T band     Other Supine Exercises  supine cane/UE Ranger       Shoulder Exercises: Standing   Extension  Strengthening;Both;20 reps    Theraband Level (Shoulder Extension)  Level 2 (Red)     Row  Strengthening;Both;20 reps;Theraband    Theraband Level (Shoulder Row)  Level 2 (Red)      Shoulder Exercises: Pulleys   Flexion  3 minutes    Other Pulley Exercises  needs A to maintain grip       Shoulder Exercises: ROM/Strengthening   UBE (Upper Arm Bike)  4 min L arm focus , no tension     Wall Pushups  10 reps x 3      Moist Heat Therapy   Number Minutes Moist Heat  10 Minutes    Moist Heat Location  Shoulder      Manual Therapy   Joint Mobilization  Gr I-II inferior glides     Passive ROM  all planes to tolerance  using rotation to decrease tone.             PT Education - 08/13/17 0906    Education Details  full ROM and focus with exercises     Person(s) Educated  Patient    Methods  Explanation    Comprehension  Verbalized understanding          PT Long Term Goals - 08/13/17 0907      PT LONG TERM GOAL #1   Title  Pt wil be able to raise his arm to 120 deg for reaching with pain no more than 3/10     Baseline  120 AA and with pain     Status  On-going      PT LONG TERM GOAL #2   Title  Pt will be able to use L arm for ADLs, normal household activities and pain <3/10.     Baseline  uses arm , pain can be >3/10     Status  On-going      PT LONG TERM GOAL #3   Title  Pt will be able to demo 4+/5 strength in L UE for maximal use during lifting, carrying items while golfing, fishing    Baseline  3+/5    Status  On-going      PT LONG TERM GOAL #4   Title  Pt will be I with HEP for posture, L UE strength and ROM    Status  On-going            Plan - 08/13/17 [id number]  Clinical Impression Statement  Patient is progressing functionally but continues to lack full ROM in L UE.  He needs assistance for grip and cues to complete HEP, exercises during session.  He was given strength HEP today.  Sees MD MOnday, may request PT for L hip pain.     PT Treatment/Interventions  ADLs/Self Care Home Management;Electrical Stimulation;Functional mobility  training;Neuromuscular re-education;Taping;Therapeutic activities;Iontophoresis 4mg /ml Dexamethasone;Moist Heat;Cryotherapy;Ultrasound;Therapeutic exercise;Patient/family education;Manual techniques;Passive range of motion;Dry needling    PT Next Visit Plan  check strength HEP, AROM to tolerance, what did MD say? ,manual     PT Home Exercise Plan  wall walks, supine cane ,  practice swinging golf club., ext/row and wall push ups red band     Consulted and Agree with Plan of Care  Patient       Patient will benefit from skilled therapeutic intervention in order to improve the following deficits and impairments:  Decreased mobility, Difficulty walking, Hypomobility, Impaired sensation, Decreased range of motion, Decreased activity tolerance, Decreased strength, Increased fascial restricitons, Impaired flexibility, Postural dysfunction, Pain, Impaired UE functional use  Visit Diagnosis: Chronic left shoulder pain  Stiffness of left shoulder, not elsewhere classified  Muscle weakness (generalized)     Problem List Patient Active Problem List   Diagnosis Date Noted  . Adhesive capsulitis of left shoulder 08/02/2017  . Pain in left leg 08/02/2017  . Gout 05/12/2017  . Chronic left shoulder pain 06/13/2015  . Neuropathic pain of shoulder 06/13/2015  . Acromioclavicular joint arthritis 03/26/2015  . PAF (paroxysmal atrial fibrillation) (HCC) 03/20/2015  . Leukocytosis 03/20/2015  . Hematemesis 03/19/2015  . Depression due to old stroke 02/26/2015  . Hemiparesis and alteration of sensations as late effects of stroke (HCC) 10/29/2014  . Hypersomnia with sleep apnea 10/29/2014  . Nicotine use disorder 10/29/2014  . Left anterior shoulder pain 10/23/2014  . Atrial fibrillation (HCC) 09/06/2014  . Left spastic hemiparesis (HCC) 08/07/2014  . Left-sided neglect 08/07/2014  . History of stroke 08/01/2014  . Tobacco dependence   . Carotid artery occlusion with infarction (HCC)   . History  of ETOH abuse   . Hyperlipidemia   . Essential hypertension, benign 10/11/2013    PAA,JENNIFER 08/13/2017, 9:30 AM  [facility] [address] [place], [place], [address] Phone: [phone]   Fax:  [phone]  Name: [name] MRN: [id number] Date of Birth: 1955/09/23  Karie Mainland, PT 08/13/17 9:30 AM Phone: [phone] Fax: [phone]

## 2017-08-17 ENCOUNTER — Ambulatory Visit: Payer: Medicare Other | Attending: Orthopaedic Surgery | Admitting: Physical Therapy

## 2017-08-17 ENCOUNTER — Encounter: Payer: Self-pay | Admitting: Physical Therapy

## 2017-08-17 DIAGNOSIS — M25512 Pain in left shoulder: Secondary | ICD-10-CM | POA: Insufficient documentation

## 2017-08-17 DIAGNOSIS — M6281 Muscle weakness (generalized): Secondary | ICD-10-CM | POA: Insufficient documentation

## 2017-08-17 DIAGNOSIS — G8929 Other chronic pain: Secondary | ICD-10-CM | POA: Insufficient documentation

## 2017-08-17 DIAGNOSIS — M25612 Stiffness of left shoulder, not elsewhere classified: Secondary | ICD-10-CM | POA: Insufficient documentation

## 2017-08-17 NOTE — Therapy (Signed)
[facility] [address] [place], [place], [address] Phone: [phone]   Fax:  [phone]  Physical Therapy Treatment  Patient Details  Name: [name] MRN: [id number] Date of Birth: 1955/07/07 Referring Provider: Dr. Norlene CampbellPeter Whitfield    Encounter Date: 08/17/2017  PT End of Session - 08/17/17 0916    Visit Number  8    Number of Visits  12    Date for PT Re-Evaluation  08/31/17    PT Start Time  0757    PT Stop Time  0835    PT Time Calculation (min)  38 min    Activity Tolerance  Patient tolerated treatment well    Behavior During Therapy  [facility] for tasks assessed/performed       Past Medical History:  Diagnosis Date  . AKI (acute kidney injury) (HCC) 03/20/2015  . Alcohol use with alcohol-induced mood disorder (HCC)    quit almost all ETOH in 07/2014, previously drank 18 to 20 beers daily.   . Alterations of sensations following CVA (cerebrovascular accident) 09/10/2014  . Arthritis   . Colon polyps 2011   Hyperplastic and 1 tubular adenoma.  Dr Loreta AveMann  . 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 identified on TEE at time of stroke  . Embolic stroke involving right middle cerebral artery (HCC) 10/29/2014  . Gout   . Hyperlipidemia   . Hypertension   . Paroxysmal atrial fibrillation (HCC)    a. identified on LINQ 08/2014  . Stroke (HCC) 07/25/14   a. s/p MDT ILR implant.  on xarelto    Past Surgical History:  Procedure Laterality Date  . COLONOSCOPY  2011   Dr Loreta AveMann.   . EP IMPLANTABLE DEVICE N/A 07/27/2014   Procedure: Loop Recorder Insertion;  Surgeon: Marinus MawGregg W Taylor, MD;  [facility]: [facility];  Service: Cardiovascular;  Laterality: N/A;  . EP IMPLANTABLE DEVICE N/A 12/20/2014   Procedure: Loop Recorder Removal;  Surgeon: Marinus MawGregg W Taylor, MD;  Location: [facility];  Service: Cardiovascular;  Laterality: N/A;  . ESOPHAGOGASTRODUODENOSCOPY N/A 03/21/2015   Procedure: ESOPHAGOGASTRODUODENOSCOPY (EGD);  Surgeon: Jeani HawkingPatrick  Hung, MD;  [facility];  Service: Endoscopy;  Laterality: N/A;  . LOOP RECORDER IMPLANT  07/27/14   LOOP REVEAL LINQ [id number]  . loop recorder removed 2017    . TEE WITHOUT CARDIOVERSION N/A 07/27/2014   Procedure: TRANSESOPHAGEAL ECHOCARDIOGRAM (TEE);  Surgeon: Chrystie NoseKenneth C Hilty, MD;  [facility];  Service: Cardiovascular;  Laterality: N/A;    There were no vitals filed for this visit.  Subjective Assessment - 08/17/17 0807    Subjective  I  was able to play golf last week. I did the stretches with the rubber straps  and stretching with hands together.  Had hip MRI,  no results yet.  I ws a little off balance and hit arm on door last night.  I did not know i had a bruise (2 3/4 inch long on left arm).    Currently in Pain?  Yes    Pain Score  4     Pain Location  Shoulder    Pain Orientation  Left    Pain Descriptors / Indicators  Aching    Pain Type  Chronic pain    Pain Radiating Towards  Also has hand numbness cramp from CVA    Pain Frequency  Constant    Aggravating Factors   sitting poorly,  old CVA    Pain Relieving Factors  meds ,  cream  stretches    Pain Score  4    Pain Location  Hip    Pain Orientation  Left;Lateral    Pain Descriptors / Indicators  Throbbing    Pain Type  Chronic pain    Pain Frequency  Constant    Aggravating Factors   walking,   standing    Pain Relieving Factors  sitting    Effect of Pain on Daily Activities  limits activity         [facility] PT Assessment - 08/17/17 0001      Strength   Left Shoulder Flexion  4/5 Within available range    Left Shoulder ABduction  4/5 within available range                   [facility] Adult PT Treatment/Exercise - 08/17/17 0001      Shoulder Exercises: Supine   Horizontal ABduction  Both;10 reps yellow band    External Rotation  10 reps;AAROM    Flexion  AROM;Both;15 reps;Left narrow grip,  yellow    Other Supine Exercises  supine yellow T band  external rotation    Other  Supine Exercises  supine cane/UE Ranger       Shoulder Exercises: Seated   Other Seated Exercises  PNF diagonals AA  to decrease tone and hand cramp      Shoulder Exercises: Sidelying   Other Sidelying Exercises  bookopener mod cues to decrease tone UE/ trunk      Shoulder Exercises: Standing   Protraction  10 reps red band,  cued initially    ABduction  5 reps;AAROM cane    Extension  AAROM;10 reps cane    Row  Strengthening    Theraband Level (Shoulder Row)  Level 2 (Red) cued initially      Shoulder Exercises: Pulleys   Flexion  2 minutes needed assist to hold handle today      Shoulder Exercises: Stretch   Other Shoulder Stretches  door way stretch 5 X 5 seconds for ER      Manual Therapy   Joint Mobilization  Gr I-II inferior glides     Passive ROM  all planes to tolerance  using rotation to decrease tone.             PT Education - 08/17/17 0915    Education Details  Exercise form/ technique    Methods  Explanation;Verbal cues;Tactile cues    Comprehension  Verbalized understanding;Returned demonstration;Tactile cues required          PT Long Term Goals - 08/17/17 0919      PT LONG TERM GOAL #1   Title  Pt wil be able to raise his arm to 120 deg for reaching with pain no more than 3/10     Time  6    Period  Weeks    Status  Unable to assess      PT LONG TERM GOAL #2   Title  Pt will be able to use L arm for ADLs, normal household activities and pain <3/10.     Baseline  uses arm , pain can be >3/10     Time  6    Period  Weeks    Status  On-going      PT LONG TERM GOAL #3   Title  Pt will be able to demo 4+/5 strength in L UE for maximal use during lifting, carrying items while golfing, fishing    Baseline  4/4 (  improved from 3+/5)    Time  6    Period  Weeks    Status  On-going      PT LONG TERM GOAL #4   Title  Pt will be I with HEP for posture, L UE strength and ROM    Baseline  compliant    Time  6    Period  Weeks    Status  On-going             Plan - 08/17/17 1914    Clinical Impression Statement  Strength improving left shoulder to 4/5 , with available range.  ROM focus today.  Tone present  today which increased the need of assist with exercise.  Patient declined the need of modalities for pain today.    PT Next Visit Plan  check HEP, AROM to tolerance, what did MD say? ,manual     PT Home Exercise Plan  wall walks, supine cane ,  practice swinging golf club., ext/row and wall push ups red band     Recommended Other Services  Discuss with MD and pharmacist the effects of new depression medication.    Consulted and Agree with Plan of Care  Patient       Patient will benefit from skilled therapeutic intervention in order to improve the following deficits and impairments:     Visit Diagnosis: Chronic left shoulder pain  Stiffness of left shoulder, not elsewhere classified  Muscle weakness (generalized)     Problem List Patient Active Problem List   Diagnosis Date Noted  . Adhesive capsulitis of left shoulder 08/02/2017  . Pain in left leg 08/02/2017  . Gout 05/12/2017  . Chronic left shoulder pain 06/13/2015  . Neuropathic pain of shoulder 06/13/2015  . Acromioclavicular joint arthritis 03/26/2015  . PAF (paroxysmal atrial fibrillation) (HCC) 03/20/2015  . Leukocytosis 03/20/2015  . Hematemesis 03/19/2015  . Depression due to old stroke 02/26/2015  . Hemiparesis and alteration of sensations as late effects of stroke (HCC) 10/29/2014  . Hypersomnia with sleep apnea 10/29/2014  . Nicotine use disorder 10/29/2014  . Left anterior shoulder pain 10/23/2014  . Atrial fibrillation (HCC) 09/06/2014  . Left spastic hemiparesis (HCC) 08/07/2014  . Left-sided neglect 08/07/2014  . History of stroke 08/01/2014  . Tobacco dependence   . Carotid artery occlusion with infarction (HCC)   . History of ETOH abuse   . Hyperlipidemia   . Essential hypertension, benign 10/11/2013    HARRIS,KAREN PTA 08/17/2017,  9:22 AM  [facility] [address] [place], [place], [address] Phone: [phone]   Fax:  [phone]  Name: [name] MRN: [id number] Date of Birth: Dec 09, 1955

## 2017-08-20 ENCOUNTER — Encounter: Payer: Self-pay | Admitting: Physical Therapy

## 2017-08-20 ENCOUNTER — Ambulatory Visit: Payer: Medicare Other | Admitting: Physical Therapy

## 2017-08-20 DIAGNOSIS — M6281 Muscle weakness (generalized): Secondary | ICD-10-CM | POA: Diagnosis not present

## 2017-08-20 DIAGNOSIS — G8929 Other chronic pain: Secondary | ICD-10-CM

## 2017-08-20 DIAGNOSIS — M25512 Pain in left shoulder: Secondary | ICD-10-CM | POA: Diagnosis not present

## 2017-08-20 DIAGNOSIS — M25612 Stiffness of left shoulder, not elsewhere classified: Secondary | ICD-10-CM

## 2017-08-20 NOTE — Therapy (Addendum)
[place], [place], [address] Phone: [phone]   Fax:  [phone]  Physical Therapy Treatment/Discharge  Patient Details  Name: [name] MRN: [id number] Date of Birth: 08-Jun-1955 Referring Provider: Dr. Joni Fears    Encounter Date: 08/20/2017  PT End of Session - 08/20/17 0850    Visit Number  9    Number of Visits  12    Date for PT Re-Evaluation  08/31/17    PT Start Time  0845    PT Stop Time  0935    PT Time Calculation (min)  50 min    Activity Tolerance  Patient tolerated treatment well    Behavior During Therapy  [facility] for tasks assessed/performed       Past Medical History:  Diagnosis Date  . AKI (acute kidney injury) ([place]) 03/20/2015  . Alcohol use with alcohol-induced mood disorder ([place])    quit almost all ETOH in 07/2014, previously drank 18 to 20 beers daily.   . Alterations of sensations following CVA (cerebrovascular accident) 09/10/2014  . Arthritis   . Colon polyps 2011   Hyperplastic and 1 tubular adenoma.  Dr Collene Mares  . 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 identified on TEE at time of stroke  . Embolic stroke involving right middle cerebral artery ([place]) 10/29/2014  . Gout   . Hyperlipidemia   . Hypertension   . Paroxysmal atrial fibrillation ([place])    a. identified on LINQ 08/2014  . Stroke ([place]) 07/25/14   a. s/p MDT ILR implant.  on xarelto    Past Surgical History:  Procedure Laterality Date  . COLONOSCOPY  2011   Dr Collene Mares.   . EP IMPLANTABLE DEVICE N/A 07/27/2014   Procedure: Loop Recorder Insertion;  Surgeon: Evans Lance, MD;  Location: [place] CV LAB;  Service: Cardiovascular;  Laterality: N/A;  . EP IMPLANTABLE DEVICE N/A 12/20/2014   Procedure: Loop Recorder Removal;  Surgeon: Evans Lance, MD;  Location: [place] CV LAB;  Service: Cardiovascular;  Laterality: N/A;  . ESOPHAGOGASTRODUODENOSCOPY N/A 03/21/2015   Procedure: ESOPHAGOGASTRODUODENOSCOPY (EGD);  Surgeon:  Carol Ada, MD;  Location: [facility] ENDOSCOPY;  Service: Endoscopy;  Laterality: N/A;  . LOOP RECORDER IMPLANT  07/27/14   LOOP REVEAL LINQ [id number] - [id number]  . loop recorder removed 2017    . TEE WITHOUT CARDIOVERSION N/A 07/27/2014   Procedure: TRANSESOPHAGEAL ECHOCARDIOGRAM (TEE);  Surgeon: Pixie Casino, MD;  Location: [facility] ENDOSCOPY;  Service: Cardiovascular;  Laterality: N/A;    There were no vitals filed for this visit.  Subjective Assessment - 08/20/17 0848    Subjective  My wife was surprised how good I can move my arm.      Currently in Pain?  Yes    Pain Score  4     Pain Location  Shoulder    Pain Orientation  Left    Pain Descriptors / Indicators  Aching    Pain Type  Chronic pain    Pain Onset  More than a month ago    Pain Frequency  Intermittent         [facility] PT Assessment - 08/20/17 0001      AROM   Overall AROM Comments  measured ER/IE in supine     Left Shoulder Flexion  122 Degrees    Left Shoulder ABduction  100 Degrees compensation trunk and scap     Left Shoulder Internal Rotation  60 Degrees    Left Shoulder External Rotation  30 Degrees         [facility] Adult PT Treatment/Exercise - 08/20/17 0001      Shoulder Exercises: Supine   Horizontal ABduction  Strengthening;Both;10 reps    External Rotation  Strengthening;Both;10 reps    Other Supine Exercises  supine UE ranger chest press and overhead press x 15 each       Shoulder Exercises: Standing   Horizontal ABduction  Strengthening;Both;10 reps    External Rotation  Strengthening;Both;10 reps    Extension  Strengthening;Both;10 reps    Row  Strengthening;Both;10 reps    Theraband Level (Shoulder Row)  Level 2 (Red)    Other Standing Exercises  bicep curl x 10 yellow band       Shoulder Exercises: Pulleys   Flexion  3 minutes      Shoulder Exercises: ROM/Strengthening   UBE (Upper Arm Bike)  4 min L/R arm focus , no tension     Wall Pushups  10 reps    Other ROM/Strengthening Exercises  isometris  ball press ext and chest press with ball x 10     Other ROM/Strengthening Exercises  standing ball AAROM flexion, cross body reaching cues to look at L hand       Moist Heat Therapy   Number Minutes Moist Heat  8 Minutes    Moist Heat Location  Shoulder      Manual Therapy   Joint Mobilization  Gr I-II inferior glides     Passive ROM  all planes to tolerance  using rotation to decrease tone.             PT Education - 08/20/17 0930    Education Details  new HEP, band for appropriate tension, POC     Person(s) Educated  Patient    Methods  Explanation;Demonstration;Handout    Comprehension  Verbalized understanding;Returned demonstration          PT Long Term Goals - 08/20/17 0914      PT LONG TERM GOAL #1   Title  Pt wil be able to raise his arm to 120 deg for reaching with pain no more than 3/10     Baseline  120 AROM and with pain minimal     Status  Achieved      PT LONG TERM GOAL #2   Title  Pt will be able to use L arm for ADLs, normal household activities and pain <3/10.     Status  Achieved      PT LONG TERM GOAL #3   Title  Pt will be able to demo 4+/5 strength in L UE for maximal use during lifting, carrying items while golfing, fishing    Status  On-going      PT LONG TERM GOAL #4   Title  Pt will be I with HEP for posture, L UE strength and ROM    Status  On-going            Plan - 08/20/17 0912    Clinical Impression Statement  Patient improving functionally but has trouble attending to L UE during exercises.  Needs reminders to look at his hand, maintain grip.         Patient will benefit from skilled therapeutic intervention in order to improve the following deficits and impairments:     Visit Diagnosis: Chronic left shoulder pain  Stiffness of left shoulder, not elsewhere classified  Muscle weakness (generalized)     Problem List Patient Active Problem List   Diagnosis  Date Noted  . Adhesive capsulitis of left shoulder  08/02/2017  . Pain in left leg 08/02/2017  . Gout 05/12/2017  . Chronic left shoulder pain 06/13/2015  . Neuropathic pain of shoulder 06/13/2015  . Acromioclavicular joint arthritis 03/26/2015  . PAF (paroxysmal atrial fibrillation) ([place]) 03/20/2015  . Leukocytosis 03/20/2015  . Hematemesis 03/19/2015  . Depression due to old stroke 02/26/2015  . Hemiparesis and alteration of sensations as late effects of stroke ([place]) 10/29/2014  . Hypersomnia with sleep apnea 10/29/2014  . Nicotine use disorder 10/29/2014  . Left anterior shoulder pain 10/23/2014  . Atrial fibrillation ([place]) 09/06/2014  . Left spastic hemiparesis ([place]) 08/07/2014  . Left-sided neglect 08/07/2014  . History of stroke 08/01/2014  . Tobacco dependence   . Carotid artery occlusion with infarction ([place])   . History of ETOH abuse   . Hyperlipidemia   . Essential hypertension, benign 10/11/2013    Braxtyn Bojarski 08/20/2017, 9:37 AM  [facility] [address] [place], [place], [address] Phone: [phone]   Fax:  [phone]  Name: [name] MRN: [id number] Date of Birth: 07/07/1955  Raeford Razor, PT 08/20/17 9:37 AM Phone: [phone] Fax: [phone]   PHYSICAL THERAPY DISCHARGE SUMMARY  Visits from Start of Care: 9  Current functional level related to goals / functional outcomes: See above    Remaining deficits: Unknown   Education / Equipment: Posture, HEP, RICE  Plan: Patient agrees to discharge.  Patient goals were partially met. Patient is being discharged due to not returning since the last visit.  ?????    Raeford Razor, PT 12/01/17 1:47 PM Phone: [phone] Fax: [phone]

## 2017-08-20 NOTE — Patient Instructions (Signed)
Basic Biceps Curl - Tubing    Sit holding tubing, palms facing up. Bend elbows, keeping them near ribs. Anchor in front and low. Do __2_ sets of __10_ repetitions. Advanced: Do alternating arms.  Copyright  VHI. All rights reserved.    External Rotation (Passive)    With elbow bent and forearm on table, palm down, bend forward at waist until a stretch is felt. Hold __30__ seconds. Repeat __5__ times. Do __2__ sessions per day.  Copyright  VHI. All rights reserved.   Resisted External Rotation: in Neutral - Bilateral    Sit or stand, tubing in both hands, elbows at sides, bent to 90, forearms forward. Pinch shoulder blades together and rotate forearms out. Keep elbows at sides. Repeat ___10_ times per set. Do __1-2__ sets per session. Do __2__ sessions per day.  http://orth.exer.us/966   Copyright  VHI. All rights reserved.

## 2017-08-23 ENCOUNTER — Other Ambulatory Visit: Payer: Self-pay

## 2017-08-23 ENCOUNTER — Encounter (INDEPENDENT_AMBULATORY_CARE_PROVIDER_SITE_OTHER): Payer: Self-pay | Admitting: Orthopaedic Surgery

## 2017-08-23 ENCOUNTER — Ambulatory Visit (INDEPENDENT_AMBULATORY_CARE_PROVIDER_SITE_OTHER): Payer: Medicare Other | Admitting: Orthopaedic Surgery

## 2017-08-23 ENCOUNTER — Other Ambulatory Visit: Payer: Medicare (Managed Care) | Admitting: *Deleted

## 2017-08-23 VITALS — BP 120/67 | HR 61 | Ht 66.0 in | Wt 145.0 lb

## 2017-08-23 DIAGNOSIS — E785 Hyperlipidemia, unspecified: Secondary | ICD-10-CM | POA: Diagnosis not present

## 2017-08-23 DIAGNOSIS — G8929 Other chronic pain: Secondary | ICD-10-CM | POA: Diagnosis not present

## 2017-08-23 DIAGNOSIS — M5442 Lumbago with sciatica, left side: Secondary | ICD-10-CM | POA: Diagnosis not present

## 2017-08-23 LAB — LIPID PANEL
CHOLESTEROL TOTAL: 136 mg/dL (ref 100–199)
Chol/HDL Ratio: 4.1 ratio (ref 0.0–5.0)
HDL: 33 mg/dL — AB (ref >39)
LDL Calculated: 84 mg/dL (ref 0–99)
TRIGLYCERIDES: 97 mg/dL (ref 0–149)
VLDL Cholesterol Cal: 19 mg/dL (ref 5–40)

## 2017-08-23 LAB — HEPATIC FUNCTION PANEL
ALBUMIN: 3.8 g/dL (ref 3.6–4.8)
ALK PHOS: 106 IU/L (ref 39–117)
ALT: 16 IU/L (ref 0–44)
AST: 14 IU/L (ref 0–40)
Bilirubin Total: 0.3 mg/dL (ref 0.0–1.2)
Bilirubin, Direct: 0.09 mg/dL (ref 0.00–0.40)
Total Protein: 6.6 g/dL (ref 6.0–8.5)

## 2017-08-23 NOTE — Progress Notes (Signed)
Office Visit Note   Patient: [name]           Date of Birth: 07-Jan-1956           MRN: [id number] Visit Date: 08/23/2017              Requested by: Porfirio Oar, PA-C No address on file PCP: Porfirio Oar, PA-C   Assessment & Plan: Visit Diagnoses:  1. Chronic left-sided low back pain with left-sided sciatica     Plan: Epidural steroid protruding disc at L4-5 on the left.  Long discussion with Mr. and Mrs. Medlen regarding MRI scan findings of protruding disc at the above level.  Think some of the discomfort in his left leg is related to his prior stroke especially clawing of his toes.  However he does have pain in the L4 nerve distribution.  From a diagnostic and therapeutic standpoint I think the epidural steroid injection will be very helpful  Follow-Up Instructions: Return in about 1 month (around 09/23/2017).   Orders:  No orders of the defined types were placed in this encounter.  No orders of the defined types were placed in this encounter.     Procedures: No procedures performed   Clinical Data: No additional findings.   Subjective: Chief Complaint  Patient presents with  . Follow-up    2 WK F/U MRI L SPINE REVIEW  [name] is accompanied by his wife and here for follow-up evaluation his back and left lower extremity pain.  He had an MRI scan that demonstrates a left extraforaminal disc protrusion at L4-5 contacting the exiting left L4 nerve root he also had some mild disc bulging at L2-3 and L3-4 without significant stenosis or nerve impingement.  Mild multilevel facet hypertrophy most notable at L4-5 on the left and L5-S1 on the right.  Most symptoms on the right.  Has prior problem with right-sided CVA affecting his left side.  He does have some clawing with weightbearing of his left toes  HPI  Review of Systems  Constitutional: Negative for fatigue and fever.  HENT: Negative for ear pain.   Eyes: Negative for pain.  Respiratory: Negative for cough  and shortness of breath.   Cardiovascular: Negative for leg swelling.  Gastrointestinal: Negative for constipation and diarrhea.  Genitourinary: Negative for difficulty urinating.  Musculoskeletal: Negative for back pain and neck pain.  Skin: Negative for rash.  Allergic/Immunologic: Negative for food allergies.  Neurological: Positive for weakness. Negative for numbness.  Psychiatric/Behavioral: Negative for sleep disturbance.     Objective: Vital Signs: BP 120/67 (BP Location: Left Arm, Patient Position: Sitting, Cuff Size: Normal)   Pulse 61   Ht 5\' 6"  (1.676 m)   Wt 145 lb (65.8 kg)   BMI 23.40 kg/m   Physical Exam  Constitutional: He is oriented to person, place, and time. He appears well-developed and well-nourished.  HENT:  Mouth/Throat: Oropharynx is clear and moist.  Eyes: Pupils are equal, round, and reactive to light. EOM are normal.  Pulmonary/Chest: Effort normal.  Neurological: He is alert and oriented to person, place, and time.  Skin: Skin is warm and dry.  Psychiatric: He has a normal mood and affect. His behavior is normal.    Ortho Exam awake alert and oriented x3.  Comfortable sitting.  Straight leg raise negative bilaterally.  Painless range of motion both hips.  No percussible tenderness of the lumbar spine.  No numbness in his right or left thigh.  Subjectively he does have some discomfort in  the distribution of the L4 left nerve  Specialty Comments:  No specialty comments available.  Imaging: No results found.   PMFS History: Patient Active Problem List   Diagnosis Date Noted  . Adhesive capsulitis of left shoulder 08/02/2017  . Pain in left leg 08/02/2017  . Gout 05/12/2017  . Chronic left shoulder pain 06/13/2015  . Neuropathic pain of shoulder 06/13/2015  . Acromioclavicular joint arthritis 03/26/2015  . PAF (paroxysmal atrial fibrillation) (HCC) 03/20/2015  . Leukocytosis 03/20/2015  . Hematemesis 03/19/2015  . Depression due to old  stroke 02/26/2015  . Hemiparesis and alteration of sensations as late effects of stroke (HCC) 10/29/2014  . Hypersomnia with sleep apnea 10/29/2014  . Nicotine use disorder 10/29/2014  . Left anterior shoulder pain 10/23/2014  . Atrial fibrillation (HCC) 09/06/2014  . Left spastic hemiparesis (HCC) 08/07/2014  . Left-sided neglect 08/07/2014  . History of stroke 08/01/2014  . Tobacco dependence   . Carotid artery occlusion with infarction (HCC)   . History of ETOH abuse   . Hyperlipidemia   . Essential hypertension, benign 10/11/2013   Past Medical History:  Diagnosis Date  . AKI (acute kidney injury) (HCC) 03/20/2015  . Alcohol use with alcohol-induced mood disorder (HCC)    quit almost all ETOH in 07/2014, previously drank 18 to 20 beers daily.   . Alterations of sensations following CVA (cerebrovascular accident) 09/10/2014  . Arthritis   . Colon polyps 2011   Hyperplastic and 1 tubular adenoma.  Dr [name]  . 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 identified on TEE at time of stroke  . Embolic stroke involving right middle cerebral artery (HCC) 10/29/2014  . Gout   . Hyperlipidemia   . Hypertension   . Paroxysmal atrial fibrillation (HCC)    a. identified on LINQ 08/2014  . Stroke (HCC) 07/25/14   a. s/p MDT ILR implant.  on xarelto    Family History  Problem Relation Age of Onset  . Cancer Father        leukemia  . Heart attack Mother   . Heart disease Brother   . Heart attack Brother [id number]       CABG, stenting  . Hypertension Brother   . Alcohol abuse Brother     Past Surgical History:  Procedure Laterality Date  . COLONOSCOPY  2011   Dr [name].   . EP IMPLANTABLE DEVICE N/A 07/27/2014   Procedure: Loop Recorder Insertion;  Surgeon: Marinus MawGregg W Taylor, MD;  [facility];  Service: Cardiovascular;  Laterality: N/A;  . EP IMPLANTABLE DEVICE N/A 12/20/2014   Procedure: Loop Recorder Removal;  Surgeon: Marinus MawGregg W Taylor, MD;  [facility]: [facility];  Service: Cardiovascular;   Laterality: N/A;  . ESOPHAGOGASTRODUODENOSCOPY N/A 03/21/2015   Procedure: ESOPHAGOGASTRODUODENOSCOPY (EGD);  Surgeon: Jeani HawkingPatrick Hung, MD;  [facility];  Service: Endoscopy;  Laterality: N/A;  . LOOP RECORDER IMPLANT  07/27/14   LOOP REVEAL LINQ [id number] - [id number]  . loop recorder removed 2017    . TEE WITHOUT CARDIOVERSION N/A 07/27/2014   Procedure: TRANSESOPHAGEAL ECHOCARDIOGRAM (TEE);  Surgeon: Chrystie NoseKenneth C Hilty, MD;  [facility];  Service: Cardiovascular;  Laterality: N/A;   Social History   Occupational History  . Occupation: former Merchandiser, retailmeat cutter    Comment: disabled due to CVA  Tobacco Use  . Smoking status: Current Every Day Smoker    Packs/day: 0.50    Years: 40.00    Pack years: 20.00    Types: Cigarettes  .  Smokeless tobacco: Never Used  . Tobacco comment: cut back after stroke, but increased again due to depression  Substance and Sexual Activity  . Alcohol use: Not Currently  . Drug use: Yes    Types: Marijuana    Comment: trying to help his appetite  . Sexual activity: Yes    Partners: Female

## 2017-08-24 ENCOUNTER — Ambulatory Visit: Payer: Self-pay

## 2017-08-24 NOTE — Progress Notes (Deleted)
Patient ID: [name]                 DOB: 1955-08-08                    MRN: [id number]     HPI: [name] is a 62 y.o. male patient of Dr. Ladona Ridgel that presents today for lipid follow up.  PMH includes PAF, remote cryptogenic stroke, dyslipidemia, and HTN. He admits to some non-compliance. He is still smoking though he stopped drinking ETOH. At his last visit with lipid clinic, compliance and tobacco cessation was discussed at length.   He presents today for follow up discussion of cholesterol.  Previously quit cigarettes for 30 days (his longest quit attempt).   Risk Factors: HTN, cryptogenic stroke LDL Goal: <70  Current Medications: atorvastatin 80mg  daily  Intolerances: none  Diet: He eats meals prepared from home. He eats more chicken than anything. He does endorse fried chicken mostly. He has started eating cheerios. He does eat vegetables. He drinks iced tea mostly. He does endorse liking sweets.   Exercise: He does not really exercise. He is limited due hip pain.   Family History: father - cancer, mother - MI, Brother - heart disease, MI at 58, HTN, alcohol abuse  Social History: current smoker tries to smoke less than a 1/2 pack, no alcohol  Labs: 08/23/17:  TC 136, TG 97, HDL 33, LDL 84 LFT WNL (atorvastatin 80mg  daily) 06/14/17: TC 254, TG 97, HDL 29, LDL 206 (noncompliant to atorvastatin)  Past Medical History:  Diagnosis Date  . AKI (acute kidney injury) (HCC) 03/20/2015  . Alcohol use with alcohol-induced mood disorder (HCC)    quit almost all ETOH in 07/2014, previously drank 18 to 20 beers daily.   . Alterations of sensations following CVA (cerebrovascular accident) 09/10/2014  . Arthritis   . Colon polyps 2011   Hyperplastic and 1 tubular adenoma.  Dr Loreta Ave  . 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 identified on TEE at time of stroke  . Embolic stroke involving right middle cerebral artery (HCC) 10/29/2014  . Gout   . Hyperlipidemia   . Hypertension   . Paroxysmal atrial  fibrillation (HCC)    a. identified on LINQ 08/2014  . Stroke (HCC) 07/25/14   a. s/p MDT ILR implant.  on xarelto    Current Outpatient Medications on File Prior to Visit  Medication Sig Dispense Refill  . atorvastatin (LIPITOR) 80 MG tablet TAKE 1 TABLET BY MOUTH  DAILY AT 6 PM 90 tablet 3  . buPROPion (WELLBUTRIN XL) 150 MG 24 hr tablet Take 1 tablet (150 mg total) by mouth daily. 90 tablet 1  . colchicine 0.6 MG tablet Take 1 tablet (0.6 mg total) by mouth 2 (two) times daily as needed (gout). 20 tablet 1  . gabapentin (NEURONTIN) 300 MG capsule Take 1 capsule (300 mg total) by mouth 3 (three) times daily. 270 capsule 1  . lisinopril (PRINIVIL,ZESTRIL) 5 MG tablet Take 1 tablet (5 mg total) by mouth daily. 90 tablet 3  . rivaroxaban (XARELTO) 20 MG TABS tablet TAKE 1 TABLET(20 MG) BY MOUTH DAILY WITH DINNER 90 tablet 3  . sertraline (ZOLOFT) 100 MG tablet Take 1 tablet (100 mg total) by mouth daily. 90 tablet 3  . traMADol (ULTRAM) 50 MG tablet Take 1 tablet (50 mg total) by mouth 2 (two) times daily as needed. 60 tablet 0  . varenicline (CHANTIX CONTINUING MONTH PAK) 1 MG tablet Take 1 tablet (  1 mg total) by mouth 2 (two) times daily. 180 tablet 1   No current facility-administered medications on file prior to visit.     No Known Allergies  Assessment/Plan: Hyperlipidemia: LDL not goal. Add ezetimibe 10mg  daily   Tobacco cessation: discussed chantix at length. Recommended against using Chantix in combination with smoking marijuana. Advised to contact primary doctor with concerns about mj use with chantix.    Thank you,  Freddie ApleyKelley M. Cleatis PolkaAuten, PharmD  [facility]  08/24/2017 8:51 AM

## 2017-08-25 ENCOUNTER — Telehealth: Payer: Self-pay | Admitting: Pharmacist

## 2017-08-25 NOTE — Telephone Encounter (Signed)
Notes recorded by Levin BaconAuten, Keone Kamer M, RPH on 08/25/2017 at 9:59 AM EDT Pt missed lipid appt for 08/24/17. Significant improvement in LDL since restarting atorvastatin 80mg . LDL still not quite at goal. Will plan to add ezetimibe 10mg  daily. Will call to advise.   LMOM.

## 2017-08-26 ENCOUNTER — Telehealth: Payer: Self-pay

## 2017-08-26 NOTE — Telephone Encounter (Signed)
Spoke with pt wife. Rescheduled appt to come in office to discuss lipid results.

## 2017-08-26 NOTE — Telephone Encounter (Signed)
Copied from CRM [id number]. Topic: General - Other >> Aug 25, 2017  5:49 PM Trula SladeWalter, Linda F wrote: Reason for CRM:   Patient needs a letter stating that it is okay for the patient to go off of his rivaroxaban (XARELTO) 20 MG TABS tablet medication for a couple of days for an injection for a pinched nerve.  [facility].  Chelle's pt - sent to Dr. Creta LevinStallings

## 2017-08-27 ENCOUNTER — Encounter

## 2017-08-28 NOTE — Telephone Encounter (Signed)
Let the patient know that he should make an appointment with a provider. This could be a same day appointment. We need to review his chart. It looks like he was on Xarelto due to a stroke. Based on the office discussion a determination can be made and put in a letter.

## 2017-08-29 NOTE — Telephone Encounter (Signed)
LMOVM for pt per Dr. Creta LevinStallings' message

## 2017-08-30 ENCOUNTER — Ambulatory Visit (INDEPENDENT_AMBULATORY_CARE_PROVIDER_SITE_OTHER): Payer: Medicare Other | Admitting: Family Medicine

## 2017-08-30 ENCOUNTER — Encounter: Payer: Self-pay | Admitting: Family Medicine

## 2017-08-30 ENCOUNTER — Other Ambulatory Visit: Payer: Self-pay

## 2017-08-30 VITALS — BP 128/72 | HR 93 | Temp 98.5°F | Resp 16 | Ht 66.34 in | Wt 143.0 lb

## 2017-08-30 DIAGNOSIS — I69359 Hemiplegia and hemiparesis following cerebral infarction affecting unspecified side: Secondary | ICD-10-CM | POA: Diagnosis not present

## 2017-08-30 DIAGNOSIS — Z8673 Personal history of transient ischemic attack (TIA), and cerebral infarction without residual deficits: Secondary | ICD-10-CM | POA: Diagnosis not present

## 2017-08-30 DIAGNOSIS — M5442 Lumbago with sciatica, left side: Secondary | ICD-10-CM

## 2017-08-30 DIAGNOSIS — I69398 Other sequelae of cerebral infarction: Secondary | ICD-10-CM

## 2017-08-30 DIAGNOSIS — I480 Paroxysmal atrial fibrillation: Secondary | ICD-10-CM

## 2017-08-30 DIAGNOSIS — G8929 Other chronic pain: Secondary | ICD-10-CM

## 2017-08-30 NOTE — Progress Notes (Signed)
Subjective:    Patient ID: [name], male    DOB: 08/24/1955, 62 y.o.   MRN: [id number]  08/30/2017  Medication Management (pt states he was told he needed a stop the xarelto for an injection and needed to be seen )    HPI This 62 y.o. male presents for evaluation of anticoagulation therapy.  Patient suffered stroke in 2016.  Loop recorder was placed by cardiology and revealed paroxysmal atrial fibrillation.  Patient has been maintained on Xarelto therapy ever since.  Patient continues to suffer with left-sided weakness since CVA.  Undergoing treatment by Dr. Cleophas Dunker for chronic left sided lower back pain with left sided sciatica; underwent MRI lumbar spine that revealed protruding disc at L4-5 on left.  Recommending epidural steroid injection for treatment.    S/p CVA embolic stroke involving Right middle cerebral artery in 10/29/2014.   Known atrial fibrillation maintained on Xarelto.  ILR removed one year ago. Not sure when procedure to be scheduled.  Unable to walk long; onset since stroke.  Feels like walking on a ball on L hip.   BP Readings from Last 3 Encounters:  08/30/17 128/72  08/23/17 120/67  08/02/17 (!) 110/58   Wt Readings from Last 3 Encounters:  08/30/17 143 lb (64.9 kg)  08/23/17 145 lb (65.8 kg)  08/02/17 145 lb (65.8 kg)   Immunization History  Administered Date(s) Administered  . Influenza,inj,Quad PF,6+ Mos 01/05/2014, 10/30/2014, 12/10/2015, 05/17/2017  . Tdap 12/10/2015    Review of Systems  Constitutional: Negative for activity change, appetite change, chills, diaphoresis, fatigue and fever.  Respiratory: Negative for cough and shortness of breath.   Cardiovascular: Negative for chest pain, palpitations and leg swelling.  Gastrointestinal: Negative for abdominal pain, diarrhea, nausea and vomiting.  Endocrine: Negative for cold intolerance, heat intolerance, polydipsia, polyphagia and polyuria.  Musculoskeletal: Positive for arthralgias, back  pain and gait problem.  Skin: Negative for color change, rash and wound.  Neurological: Positive for weakness and numbness. Negative for dizziness, tremors, seizures, syncope, facial asymmetry, speech difficulty, light-headedness and headaches.  Hematological: Negative for adenopathy. Does not bruise/bleed easily.  Psychiatric/Behavioral: Negative for dysphoric mood and sleep disturbance. The patient is not nervous/anxious.     Past Medical History:  Diagnosis Date  . AKI (acute kidney injury) (HCC) 03/20/2015  . Alcohol use with alcohol-induced mood disorder (HCC)    quit almost all ETOH in 07/2014, previously drank 18 to 20 beers daily.   . Alterations of sensations following CVA (cerebrovascular accident) 09/10/2014  . Arthritis   . Colon polyps 2011   Hyperplastic and 1 tubular adenoma.  Dr Loreta Ave  . 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 identified on TEE at time of stroke  . Embolic stroke involving right middle cerebral artery (HCC) 10/29/2014  . Gout   . Hyperlipidemia   . Hypertension   . Paroxysmal atrial fibrillation (HCC)    a. identified on LINQ 08/2014  . Stroke (HCC) 07/25/14   a. s/p MDT ILR implant.  on xarelto   Past Surgical History:  Procedure Laterality Date  . COLONOSCOPY  2011   Dr Loreta Ave.   . EP IMPLANTABLE DEVICE N/A 07/27/2014   Procedure: Loop Recorder Insertion;  Surgeon: Marinus Maw, MD;  [facility];  Service: Cardiovascular;  Laterality: N/A;  . EP IMPLANTABLE DEVICE N/A 12/20/2014   Procedure: Loop Recorder Removal;  Surgeon: Marinus Maw, MD;  [facility]: [facility];  Service: Cardiovascular;  Laterality: N/A;  . ESOPHAGOGASTRODUODENOSCOPY  N/A 03/21/2015   Procedure: ESOPHAGOGASTRODUODENOSCOPY (EGD);  Surgeon: Jeani HawkingPatrick Hung, MD;  [facility];  Service: Endoscopy;  Laterality: N/A;  . LOOP RECORDER IMPLANT  07/27/14   LOOP REVEAL LINQ [id number] - [id number]  . loop recorder removed 2017    . TEE WITHOUT CARDIOVERSION N/A 07/27/2014    Procedure: TRANSESOPHAGEAL ECHOCARDIOGRAM (TEE);  Surgeon: Chrystie NoseKenneth C Hilty, MD;  [facility];  Service: Cardiovascular;  Laterality: N/A;   No Known Allergies Current Outpatient Medications on File Prior to Visit  Medication Sig Dispense Refill  . atorvastatin (LIPITOR) 80 MG tablet TAKE 1 TABLET BY MOUTH  DAILY AT 6 PM 90 tablet 3  . buPROPion (WELLBUTRIN XL) 150 MG 24 hr tablet Take 1 tablet (150 mg total) by mouth daily. 90 tablet 1  . colchicine 0.6 MG tablet Take 1 tablet (0.6 mg total) by mouth 2 (two) times daily as needed (gout). 20 tablet 1  . gabapentin (NEURONTIN) 300 MG capsule Take 1 capsule (300 mg total) by mouth 3 (three) times daily. 270 capsule 1  . lisinopril (PRINIVIL,ZESTRIL) 5 MG tablet Take 1 tablet (5 mg total) by mouth daily. 90 tablet 3  . rivaroxaban (XARELTO) 20 MG TABS tablet TAKE 1 TABLET(20 MG) BY MOUTH DAILY WITH DINNER 90 tablet 3  . sertraline (ZOLOFT) 100 MG tablet Take 1 tablet (100 mg total) by mouth daily. 90 tablet 3  . traMADol (ULTRAM) 50 MG tablet Take 1 tablet (50 mg total) by mouth 2 (two) times daily as needed. 60 tablet 0  . varenicline (CHANTIX CONTINUING MONTH PAK) 1 MG tablet Take 1 tablet (1 mg total) by mouth 2 (two) times daily. 180 tablet 1   No current facility-administered medications on file prior to visit.    Social History   Socioeconomic History  . Marital status: Married    Spouse name: Marylu LundJanet  . Number of children: 3  . Years of education: 9  . Highest education level: Not on file  Occupational History  . Occupation: former Merchandiser, retailmeat cutter    Comment: disabled due to CVA  Social Needs  . Financial resource strain: Not on file  . Food insecurity:    Worry: Not on file    Inability: Not on file  . Transportation needs:    Medical: Not on file    Non-medical: Not on file  Tobacco Use  . Smoking status: Current Every Day Smoker    Packs/day: 0.50    Years: 40.00    Pack years: 20.00    Types: Cigarettes  .  Smokeless tobacco: Never Used  . Tobacco comment: cut back after stroke, but increased again due to depression  Substance and Sexual Activity  . Alcohol use: Not Currently  . Drug use: Yes    Types: Marijuana    Comment: trying to help his appetite  . Sexual activity: Yes    Partners: Female  Lifestyle  . Physical activity:    Days per week: 0 days    Minutes per session: 0 min  . Stress: Not on file  Relationships  . Social connections:    Talks on phone: Not on file    Gets together: Not on file    Attends religious service: Not on file    Active member of club or organization: Not on file    Attends meetings of clubs or organizations: Not on file    Relationship status: Not on file  . Intimate partner violence:    Fear of current or  ex partner: Not on file    Emotionally abused: Not on file    Physically abused: Not on file    Forced sexual activity: Not on file  Other Topics Concern  . Not on file  Social History Narrative   Lives at home with wife, Marylu Lund   Caffeine use - tea 4-5 glasses a day   Family History  Problem Relation Age of Onset  . Cancer Father        leukemia  . Heart attack Mother   . Heart disease Brother   . Heart attack Brother 37       CABG, stenting  . Hypertension Brother   . Alcohol abuse Brother        Objective:    BP 128/72   Pulse 93   Temp 98.5 F (36.9 C) (Oral)   Resp 16   Ht 5' 6.34" (1.685 m)   Wt 143 lb (64.9 kg)   SpO2 96%   BMI 22.85 kg/m  Physical Exam  Constitutional: He is oriented to person, place, and time. He appears well-developed and well-nourished. No distress.  HENT:  Head: Normocephalic and atraumatic.  Right Ear: External ear normal.  Left Ear: External ear normal.  Nose: Nose normal.  Mouth/Throat: Oropharynx is clear and moist.  Eyes: Pupils are equal, round, and reactive to light. Conjunctivae and EOM are normal.  Neck: Normal range of motion. Neck supple. Carotid bruit is not present. No  thyromegaly present.  Cardiovascular: Normal rate, regular rhythm, normal heart sounds and intact distal pulses. Exam reveals no gallop and no friction rub.  No murmur heard. Pulmonary/Chest: Effort normal and breath sounds normal. He has no wheezes. He has no rales.  Lymphadenopathy:    He has no cervical adenopathy.  Neurological: He is alert and oriented to person, place, and time. A sensory deficit is present. No cranial nerve deficit. He exhibits abnormal muscle tone.  Skin: Skin is warm and dry. No rash noted. He is not diaphoretic.  Psychiatric: He has a normal mood and affect. His behavior is normal.  Nursing note and vitals reviewed.  No results found. Depression screen [facility] 2/9 08/30/2017 07/23/2017 06/15/2017 05/12/2017 12/10/2015  Decreased Interest 0 1 0 2 0  Down, Depressed, Hopeless 0 1 0 3 0  PHQ - 2 Score 0 2 0 5 0  Altered sleeping - 0 - 0 -  Tired, decreased energy - 1 - 3 -  Change in appetite - 2 - 2 -  Feeling bad or failure about yourself  - 2 - 3 -  Trouble concentrating - 3 - 0 -  Moving slowly or fidgety/restless - 1 - 1 -  Suicidal thoughts - 1 - 3 -  PHQ-9 Score - 12 - 17 -  Difficult doing work/chores - Not difficult at all - Very difficult -  Some recent data might be hidden   Fall Risk  08/30/2017 07/23/2017 06/15/2017 06/15/2017 05/12/2017  Falls in the past year? No No No No No  Risk for fall due to : - - - - -        Assessment & Plan:   1. Chronic left-sided low back pain with left-sided sciatica   2. PAF (paroxysmal atrial fibrillation) (HCC)   3. History of stroke   4. Hemiparesis and alteration of sensations as late effects of stroke (HCC)     Chronic left-sided lower back pain with sciatica with history of paroxysmal atrial fibrillation maintained on Xarelto therapy in a  patient with a history of CVA with left hemiparesis: Orthopedic surgeon is recommending epidural steroid injection for bulging disc per MRI.  Current guidelines recommend holding  Xarelto for 72 hours prior to epidural injection and restarting Xarelto 12-24 hours after injection; restart may need to be delayed if epidural injection is traumatic/bloody in nature.  Patient advised to hold Xarelto for 72 hours prior to injection and will restart it 72 hours after injection.   No orders of the defined types were placed in this encounter.  No orders of the defined types were placed in this encounter.   No follow-ups on file.   Captola Teschner Paulita Fujita, M.D. Primary Care at [facility] previously [facility] [address] [place], [place]  [address] [phone] phone [phone] fax

## 2017-08-30 NOTE — Progress Notes (Deleted)
Subjective:    Patient ID: [name], male    DOB: November 01, 1955, 62 y.o.   MRN: [id number]  08/30/2017  Medication Management (pt states he was told he needed a stop the xarelto for an injection and needed to be seen )    HPI This 62 y.o. male presents for evaluation of ***. BP Readings from Last 3 Encounters:  08/23/17 120/67  08/02/17 (!) 110/58  07/23/17 112/68   Wt Readings from Last 3 Encounters:  08/23/17 145 lb (65.8 kg)  08/02/17 145 lb (65.8 kg)  07/23/17 144 lb 6.4 oz (65.5 kg)   Immunization History  Administered Date(s) Administered  . Influenza,inj,Quad PF,6+ Mos 01/05/2014, 10/30/2014, 12/10/2015, 05/17/2017  . Tdap 12/10/2015    Review of Systems  Past Medical History:  Diagnosis Date  . AKI (acute kidney injury) (HCC) 03/20/2015  . Alcohol use with alcohol-induced mood disorder (HCC)    quit almost all ETOH in 07/2014, previously drank 18 to 20 beers daily.   . Alterations of sensations following CVA (cerebrovascular accident) 09/10/2014  . Arthritis   . Colon polyps 2011   Hyperplastic and 1 tubular adenoma.  Dr Loreta Ave  . 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 identified on TEE at time of stroke  . Embolic stroke involving right middle cerebral artery (HCC) 10/29/2014  . Gout   . Hyperlipidemia   . Hypertension   . Paroxysmal atrial fibrillation (HCC)    a. identified on LINQ 08/2014  . Stroke (HCC) 07/25/14   a. s/p MDT ILR implant.  on xarelto   Past Surgical History:  Procedure Laterality Date  . COLONOSCOPY  2011   Dr Loreta Ave.   . EP IMPLANTABLE DEVICE N/A 07/27/2014   Procedure: Loop Recorder Insertion;  Surgeon: Marinus Maw, MD;  [facility];  Service: Cardiovascular;  Laterality: N/A;  . EP IMPLANTABLE DEVICE N/A 12/20/2014   Procedure: Loop Recorder Removal;  Surgeon: Marinus Maw, MD;  [facility];  Service: Cardiovascular;  Laterality: N/A;  . ESOPHAGOGASTRODUODENOSCOPY N/A 03/21/2015   Procedure:  ESOPHAGOGASTRODUODENOSCOPY (EGD);  Surgeon: Jeani Hawking, MD;  [facility];  Service: Endoscopy;  Laterality: N/A;  . LOOP RECORDER IMPLANT  07/27/14   LOOP REVEAL LINQ [id number]  . loop recorder removed 2017    . TEE WITHOUT CARDIOVERSION N/A 07/27/2014   Procedure: TRANSESOPHAGEAL ECHOCARDIOGRAM (TEE);  Surgeon: Chrystie Nose, MD;  Location: [facility] ENDOSCOPY;  Service: Cardiovascular;  Laterality: N/A;   No Known Allergies Current Outpatient Medications on File Prior to Visit  Medication Sig Dispense Refill  . atorvastatin (LIPITOR) 80 MG tablet TAKE 1 TABLET BY MOUTH  DAILY AT 6 PM 90 tablet 3  . buPROPion (WELLBUTRIN XL) 150 MG 24 hr tablet Take 1 tablet (150 mg total) by mouth daily. 90 tablet 1  . colchicine 0.6 MG tablet Take 1 tablet (0.6 mg total) by mouth 2 (two) times daily as needed (gout). 20 tablet 1  . gabapentin (NEURONTIN) 300 MG capsule Take 1 capsule (300 mg total) by mouth 3 (three) times daily. 270 capsule 1  . lisinopril (PRINIVIL,ZESTRIL) 5 MG tablet Take 1 tablet (5 mg total) by mouth daily. 90 tablet 3  . rivaroxaban (XARELTO) 20 MG TABS tablet TAKE 1 TABLET(20 MG) BY MOUTH DAILY WITH DINNER 90 tablet 3  . sertraline (ZOLOFT) 100 MG tablet Take 1 tablet (100 mg total) by mouth daily. 90 tablet 3  . traMADol (ULTRAM) 50 MG tablet Take 1 tablet (  50 mg total) by mouth 2 (two) times daily as needed. 60 tablet 0  . varenicline (CHANTIX CONTINUING MONTH PAK) 1 MG tablet Take 1 tablet (1 mg total) by mouth 2 (two) times daily. 180 tablet 1   No current facility-administered medications on file prior to visit.    Social History   Socioeconomic History  . Marital status: Married    Spouse name: Marylu Lund  . Number of children: 3  . Years of education: 9  . Highest education level: Not on file  Occupational History  . Occupation: former Merchandiser, retail    Comment: disabled due to CVA  Social Needs  . Financial resource strain: Not on file  . Food insecurity:      Worry: Not on file    Inability: Not on file  . Transportation needs:    Medical: Not on file    Non-medical: Not on file  Tobacco Use  . Smoking status: Current Every Day Smoker    Packs/day: 0.50    Years: 40.00    Pack years: 20.00    Types: Cigarettes  . Smokeless tobacco: Never Used  . Tobacco comment: cut back after stroke, but increased again due to depression  Substance and Sexual Activity  . Alcohol use: Not Currently  . Drug use: Yes    Types: Marijuana    Comment: trying to help his appetite  . Sexual activity: Yes    Partners: Female  Lifestyle  . Physical activity:    Days per week: 0 days    Minutes per session: 0 min  . Stress: Not on file  Relationships  . Social connections:    Talks on phone: Not on file    Gets together: Not on file    Attends religious service: Not on file    Active member of club or organization: Not on file    Attends meetings of clubs or organizations: Not on file    Relationship status: Not on file  . Intimate partner violence:    Fear of current or ex partner: Not on file    Emotionally abused: Not on file    Physically abused: Not on file    Forced sexual activity: Not on file  Other Topics Concern  . Not on file  Social History Narrative   Lives at home with wife, Marylu Lund   Caffeine use - tea 4-5 glasses a day   Family History  Problem Relation Age of Onset  . Cancer Father        leukemia  . Heart attack Mother   . Heart disease Brother   . Heart attack Brother 93       CABG, stenting  . Hypertension Brother   . Alcohol abuse Brother        Objective:    There were no vitals taken for this visit. Physical Exam No results found. Depression screen [facility] 2/9 08/30/2017 07/23/2017 06/15/2017 05/12/2017 12/10/2015  Decreased Interest 0 1 0 2 0  Down, Depressed, Hopeless 0 1 0 3 0  PHQ - 2 Score 0 2 0 5 0  Altered sleeping - 0 - 0 -  Tired, decreased energy - 1 - 3 -  Change in appetite - 2 - 2 -  Feeling bad or  failure about yourself  - 2 - 3 -  Trouble concentrating - 3 - 0 -  Moving slowly or fidgety/restless - 1 - 1 -  Suicidal thoughts - 1 - 3 -  PHQ-9 Score -  12 - 17 -  Difficult doing work/chores - Not difficult at all - Very difficult -  Some recent data might be hidden   Fall Risk  08/30/2017 07/23/2017 06/15/2017 06/15/2017 05/12/2017  Falls in the past year? No No No No No  Risk for fall due to : - - - - -        Assessment & Plan:  No diagnosis found.  ***  No orders of the defined types were placed in this encounter.  No orders of the defined types were placed in this encounter.   No follow-ups on file.   Mida Cory Paulita FujitaMartin Bowlby, M.D. Primary Care at [facility]  [facility] [address] [place], [place]  [id number] [phone] phone [phone] fax

## 2017-08-30 NOTE — Patient Instructions (Addendum)
  HOLD XARELTO FOR 3 DAYS PRIOR TO EPIDURAL INJECTION AND RESTART 3 DAYS AFTER EPIDURAL INJECTION.   IF you received an x-ray today, you will receive an invoice from [facility]. Please contact [facility] at [phone] with questions or concerns regarding your invoice.   IF you received labwork today, you will receive an invoice from [facility]. Please contact [facility] at [phone] with questions or concerns regarding your invoice.   Our billing staff will not be able to assist you with questions regarding bills from these companies.  You will be contacted with the lab results as soon as they are available. The fastest way to get your results is to activate your My Chart account. Instructions are located on the last page of this paperwork. If you have not heard from us regarding the results in 2 weeks, please contact this office.

## 2017-09-06 ENCOUNTER — Ambulatory Visit: Payer: Medicare Other | Admitting: Physical Therapy

## 2017-09-08 ENCOUNTER — Ambulatory Visit: Payer: Medicare Other | Admitting: Physical Therapy

## 2017-09-09 ENCOUNTER — Other Ambulatory Visit (INDEPENDENT_AMBULATORY_CARE_PROVIDER_SITE_OTHER): Payer: Self-pay | Admitting: Orthopaedic Surgery

## 2017-09-09 ENCOUNTER — Ambulatory Visit
Admission: RE | Admit: 2017-09-09 | Discharge: 2017-09-09 | Disposition: A | Discharge status: Home / Self Care | Payer: Medicare Other | Source: Ambulatory Visit | Attending: Orthopaedic Surgery | Admitting: Orthopaedic Surgery

## 2017-09-09 DIAGNOSIS — M5442 Lumbago with sciatica, left side: Principal | ICD-10-CM

## 2017-09-09 DIAGNOSIS — M5126 Other intervertebral disc displacement, lumbar region: Secondary | ICD-10-CM | POA: Diagnosis not present

## 2017-09-09 DIAGNOSIS — G8929 Other chronic pain: Secondary | ICD-10-CM

## 2017-09-09 MED ORDER — IOPAMIDOL (ISOVUE-M 200) INJECTION 41%: 1.0000 mL | Freq: Once | INTRAMUSCULAR | Status: DC

## 2017-09-09 MED ORDER — METHYLPREDNISOLONE ACETATE 40 MG/ML INJ SUSP (RADIOLOG: 120.0000 mg | Freq: Once | INTRAMUSCULAR | Status: DC

## 2017-09-09 NOTE — Discharge Instructions (Signed)

## 2017-09-13 ENCOUNTER — Telehealth: Payer: Self-pay | Admitting: Physical Therapy

## 2017-09-13 ENCOUNTER — Encounter: Payer: Self-pay | Admitting: Physical Therapy

## 2017-09-13 ENCOUNTER — Ambulatory Visit: Payer: Medicare Other | Admitting: Physical Therapy

## 2017-09-13 NOTE — Telephone Encounter (Signed)
Called patient regarding his missed appt this AM. Left message.  Has 1 more appt Wed. 7/31 and asked him to call us if he will not need that appt.   Karie MainlandJennifer Paa, PT 09/13/17 9:47 AM Phone: [phone] Fax: [phone]

## 2017-09-14 ENCOUNTER — Ambulatory Visit (INDEPENDENT_AMBULATORY_CARE_PROVIDER_SITE_OTHER): Payer: Medicare Other | Admitting: Pharmacist

## 2017-09-14 ENCOUNTER — Encounter: Payer: Self-pay | Admitting: Pharmacist

## 2017-09-14 DIAGNOSIS — E785 Hyperlipidemia, unspecified: Secondary | ICD-10-CM | POA: Diagnosis not present

## 2017-09-14 MED ORDER — EZETIMIBE 10 MG PO TABS: 10.0000 mg | ORAL_TABLET | Freq: Every day | ORAL | 3 refills | Status: DC

## 2017-09-14 NOTE — Patient Instructions (Addendum)
START ezetimibe (Zetia) 10mg  once daily. CONTINUE all other medications as prescribed.   Recheck cholesterol in 2-3 months - FASTING LABS  Cholesterol Cholesterol is a fat. Your body needs a small amount of cholesterol. Cholesterol (plaque) may build up in your blood vessels (arteries). That makes you more likely to have a heart attack or stroke. You cannot feel your cholesterol level. Having a blood test is the only way to find out if your level is high. Keep your test results. Work with your doctor to keep your cholesterol at a good level. What do the results mean?  Total cholesterol is how much cholesterol is in your blood.  LDL is bad cholesterol. This is the type that can build up. Try to have low LDL.  HDL is good cholesterol. It cleans your blood vessels and carries LDL away. Try to have high HDL.  Triglycerides are fat that the body can store or burn for energy. What are good levels of cholesterol?  Total cholesterol below 200.  LDL below 100 is good for people who have health risks. LDL below 70 is good for people who have very high risks.  HDL above 40 is good. It is best to have HDL of 60 or higher.  Triglycerides below 150. How can I lower my cholesterol? Diet Follow your diet program as told by your doctor.  Choose fish, white meat chicken, or [place] that is roasted or baked. Try not to eat red meat, fried foods, sausage, or lunch meats.  Eat lots of fresh fruits and vegetables.  Choose whole grains, beans, pasta, potatoes, and cereals.  Choose olive oil, corn oil, or canola oil. Only use small amounts.  Try not to eat butter, mayonnaise, shortening, or palm kernel oils.  Try not to eat foods with trans fats.  Choose low-fat or nonfat dairy foods. ? Drink skim or nonfat milk. ? Eat low-fat or nonfat yogurt and cheeses. ? Try not to drink whole milk or cream. ? Try not to eat ice cream, egg yolks, or full-fat cheeses.  Healthy desserts include angel food  cake, ginger snaps, animal crackers, hard candy, popsicles, and low-fat or nonfat frozen yogurt. Try not to eat pastries, cakes, pies, and cookies.  Exercise Follow your exercise program as told by your doctor.  Be more active. Try gardening, walking, and taking the stairs.  Ask your doctor about ways that you can be more active.  Medicine  Take over-the-counter and prescription medicines only as told by your doctor. This information is not intended to replace advice given to you by your health care provider. Make sure you discuss any questions you have with your health care provider. Document Released: 05/01/2008 Document Revised: 09/04/2015 Document Reviewed: 08/15/2015 Elsevier Interactive Patient Education  [employer]2018 Elsevier Inc.

## 2017-09-14 NOTE — Progress Notes (Signed)
Patient ID: [name]                 DOB: 07-May-1955                    MRN: [id number]     HPI: [name] is a 62 y.o. male patient of [name]. Ladona [name] that presents today for lipid follow up.  PMH includes PAF, remote cryptogenic stroke, dyslipidemia, and HTN. He admits to some non-compliance. He is still smoking though he stopped drinking ETOH. At his last visit with lipid clinic, compliance and tobacco cessation was discussed at length.   He presents today for follow up discussion of cholesterol. He had a follow up lipid panel about 1 month ago. He reports today that he has cut back on smoking, but has started using THC vaping rather to cut back.   Previously quit cigarettes for 30 days (his longest quit attempt).   Risk Factors: HTN, cryptogenic stroke LDL Goal: <70  Current Medications: atorvastatin 80mg  daily  Intolerances: none  Diet: He eats meals prepared from home. He eats more chicken than anything. He does endorse fried chicken mostly. He has started eating cheerios. He does eat vegetables. He drinks iced tea mostly. He does endorse liking sweets.   Exercise: He does not really exercise. He is limited due hip pain.   Family History: father - cancer, mother - MI, Brother - heart disease, MI at 5155, HTN, alcohol abuse  Social History: current smoker tries to smoke less than a 1/2 pack, no alcohol  Labs: 08/23/17:  TC 136, TG 97, HDL 33, LDL 84 LFT WNL (atorvastatin 80mg  daily) 06/14/17: TC 254, TG 97, HDL 29, LDL 206 (noncompliant to atorvastatin)  Past Medical History:  Diagnosis Date  . AKI (acute kidney injury) (HCC) 03/20/2015  . Alcohol use with alcohol-induced mood disorder (HCC)    quit almost all ETOH in 07/2014, previously drank 18 to 20 beers daily.   . Alterations of sensations following CVA (cerebrovascular accident) 09/10/2014  . Arthritis   . Colon polyps 2011   Hyperplastic and 1 tubular adenoma.  [name]  . 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 identified on TEE at time  of stroke  . Embolic stroke involving right middle cerebral artery (HCC) 10/29/2014  . Gout   . Hyperlipidemia   . Hypertension   . Paroxysmal atrial fibrillation (HCC)    a. identified on LINQ 08/2014  . Stroke (HCC) 07/25/14   a. s/p MDT ILR implant.  on xarelto    Current Outpatient Medications on File Prior to Visit  Medication Sig Dispense Refill  . atorvastatin (LIPITOR) 80 MG tablet TAKE 1 TABLET BY MOUTH  DAILY AT 6 PM 90 tablet 3  . buPROPion (WELLBUTRIN XL) 150 MG 24 hr tablet Take 1 tablet (150 mg total) by mouth daily. 90 tablet 1  . colchicine 0.6 MG tablet Take 1 tablet (0.6 mg total) by mouth 2 (two) times daily as needed (gout). 20 tablet 1  . gabapentin (NEURONTIN) 300 MG capsule Take 1 capsule (300 mg total) by mouth 3 (three) times daily. 270 capsule 1  . lisinopril (PRINIVIL,ZESTRIL) 5 MG tablet Take 1 tablet (5 mg total) by mouth daily. 90 tablet 3  . rivaroxaban (XARELTO) 20 MG TABS tablet TAKE 1 TABLET(20 MG) BY MOUTH DAILY WITH DINNER 90 tablet 3  . sertraline (ZOLOFT) 100 MG tablet Take 1 tablet (100 mg total) by mouth daily. 90 tablet 3  . traMADol (ULTRAM) 50  MG tablet Take 1 tablet (50 mg total) by mouth 2 (two) times daily as needed. 60 tablet 0  . varenicline (CHANTIX CONTINUING MONTH PAK) 1 MG tablet Take 1 tablet (1 mg total) by mouth 2 (two) times daily. 180 tablet 1   No current facility-administered medications on file prior to visit.     No Known Allergies  Assessment/Plan: Hyperlipidemia: LDL not goal, but significantly improved on atorvastatin 80mg  daily. Congratulated him on improvement and adherence. Will add ezetimibe 10mg  daily. Recheck cholesterol in 2-3 months. Will call wife with results [name]).   Tobacco cessation: Has cut back on cigarettes. He was unable to afford Chantix. Advised he continue to cut back on both cigarettes and THC products. He will continue to work on this.   Thank you,  [name]. [name], PharmD  [facility]  09/14/2017 6:53 AM

## 2017-09-15 ENCOUNTER — Ambulatory Visit: Payer: Medicare Other | Admitting: Physical Therapy

## 2017-09-24 ENCOUNTER — Ambulatory Visit (INDEPENDENT_AMBULATORY_CARE_PROVIDER_SITE_OTHER): Payer: Medicare Other | Admitting: Orthopaedic Surgery

## 2017-09-24 ENCOUNTER — Encounter (INDEPENDENT_AMBULATORY_CARE_PROVIDER_SITE_OTHER): Payer: Self-pay | Admitting: Orthopaedic Surgery

## 2017-09-24 VITALS — BP 125/80 | HR 71 | Ht 66.0 in | Wt 150.0 lb

## 2017-09-24 DIAGNOSIS — M5442 Lumbago with sciatica, left side: Secondary | ICD-10-CM | POA: Diagnosis not present

## 2017-09-24 DIAGNOSIS — G8929 Other chronic pain: Secondary | ICD-10-CM

## 2017-09-24 NOTE — Progress Notes (Signed)
Office Visit Note   Patient: [name]           Date of Birth: [birth year]           MRN: [id number] Visit Date: 09/24/2017              Requested by: Porfirio Oar, PA-C [address] [place], [place] [address] PCP: Porfirio Oar, PA-C   Assessment & Plan: Visit Diagnoses:  1. Chronic left-sided low back pain with left-sided sciatica     Plan: Several weeks status post epidural steroid injection with significant relief of his back and left lower extremity pain related to the L4-5 disc protrusion.  Having some recurrent pain.  I think it may be worthwhile to repeat the epidural steroid injection.  He has been on Xarelto and stop it for several days before the injection without a problem to do so again with a second injection.  Return in 1 month.  I have discussed other options including surgical excision of the disc.  Mr. Schmieder like to proceed his conservatively as possible  Follow-Up Instructions: Return in about 1 month (around 10/25/2017).   Orders:  No orders of the defined types were placed in this encounter.  No orders of the defined types were placed in this encounter.     Procedures: No procedures performed   Clinical Data: No additional findings.   Subjective: Chief Complaint  Patient presents with  . Follow-up    HAD INJECTION IN BACK 2 WEEKS AGO BACK STILL HAS SOME PAIN THAT IS COMING BACK  Mr. Rodino relates that he had definite improvement from the epidural steroid injection several weeks ago.  He had significantly less back and left lower extremity pain.  MRI scan revealed protruding disc at L4-5 to the left.  No problems discontinuing the Xarelto for several days prior to the injection.  He does relate having some mild recurrence of his pain  HPI  Review of Systems  Constitutional: Negative for fatigue and fever.  HENT: Negative for ear pain.   Eyes: Negative for pain.  Respiratory: Negative for cough and shortness of breath.     Gastrointestinal: Negative for constipation and diarrhea.  Genitourinary: Negative for difficulty urinating.  Musculoskeletal: Positive for back pain. Negative for neck pain.  Skin: Negative for rash.  Allergic/Immunologic: Negative for food allergies.  Neurological: Positive for weakness. Negative for numbness.  Hematological: Bruises/bleeds easily.  Psychiatric/Behavioral: Negative for sleep disturbance.     Objective: Vital Signs: BP 125/80 (BP Location: Right Arm, Patient Position: Sitting, Cuff Size: Normal)   Pulse 71   Ht 5\' 6"  (1.676 m)   Wt 150 lb (68 kg)   BMI 24.21 kg/m   Physical Exam  Constitutional: He is oriented to person, place, and time. He appears well-developed and well-nourished.  Pulmonary/Chest: Effort normal.  Abdominal: Soft.  Neurological: He is oriented to person, place, and time. He displays abnormal reflex. A sensory deficit is present. He exhibits abnormal muscle tone.    Ortho Exam awake alert and oriented x3.  Comfortable sitting.  Had prior stroke affecting his left upper and lower extremity with increased deep tendon reflexes and some spasticity.  Maintains the left wrist and flexion.  Walks with a awkward gait related to his stroke.  Straight leg raise is negative.  No percussible to US of the lumbar spine. Specialty Comments:  No specialty comments available.  Imaging: No results found.   PMFS History: Patient Active Problem List   Diagnosis Date  Noted  . Adhesive capsulitis of left shoulder 08/02/2017  . Pain in left leg 08/02/2017  . Gout 05/12/2017  . Chronic left shoulder pain 06/13/2015  . Neuropathic pain of shoulder 06/13/2015  . Acromioclavicular joint arthritis 03/26/2015  . PAF (paroxysmal atrial fibrillation) (HCC) 03/20/2015  . Leukocytosis 03/20/2015  . Hematemesis 03/19/2015  . Depression due to old stroke 02/26/2015  . Hemiparesis and alteration of sensations as late effects of stroke (HCC) 10/29/2014  . Hypersomnia  with sleep apnea 10/29/2014  . Nicotine use disorder 10/29/2014  . Left anterior shoulder pain 10/23/2014  . Atrial fibrillation (HCC) 09/06/2014  . Left spastic hemiparesis (HCC) 08/07/2014  . Left-sided neglect 08/07/2014  . History of stroke 08/01/2014  . Tobacco dependence   . Carotid artery occlusion with infarction (HCC)   . History of ETOH abuse   . Hyperlipidemia   . Essential hypertension, benign 10/11/2013   Past Medical History:  Diagnosis Date  . AKI (acute kidney injury) (HCC) 03/20/2015  . Alcohol use with alcohol-induced mood disorder (HCC)    quit almost all ETOH in 07/2014, previously drank 18 to 20 beers daily.   . Alterations of sensations following CVA (cerebrovascular accident) 09/10/2014  . Arthritis   . Colon polyps 2011   Hyperplastic and 1 tubular adenoma.  Dr Loreta AveMann  . 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 identified on TEE at time of stroke  . Embolic stroke involving right middle cerebral artery (HCC) 10/29/2014  . Gout   . Hyperlipidemia   . Hypertension   . Paroxysmal atrial fibrillation (HCC)    a. identified on LINQ 08/2014  . Stroke (HCC) 07/25/14   a. s/p MDT ILR implant.  on xarelto    Family History  Problem Relation Age of Onset  . Cancer Father        leukemia  . Heart attack Mother   . Heart disease Brother   . Heart attack Brother 1355       CABG, stenting  . Hypertension Brother   . Alcohol abuse Brother     Past Surgical History:  Procedure Laterality Date  . COLONOSCOPY  2011   Dr Loreta AveMann.   . EP IMPLANTABLE DEVICE N/A 07/27/2014   Procedure: Loop Recorder Insertion;  Surgeon: Marinus MawGregg W Taylor, MD;  Location: [facility];  Service: Cardiovascular;  Laterality: N/A;  . EP IMPLANTABLE DEVICE N/A 12/20/2014   Procedure: Loop Recorder Removal;  Surgeon: Marinus MawGregg W Taylor, MD;  Location: [facility]VAS[facility] CV LAB;  Service: Cardiovascular;  Laterality: N/A;  . ESOPHAGOGASTRODUODENOSCOPY N/A 03/21/2015   Procedure: ESOPHAGOGASTRODUODENOSCOPY (EGD);  Surgeon:  Jeani HawkingPatrick Hung, MD;  [facility];  Service: Endoscopy;  Laterality: N/A;  . LOOP RECORDER IMPLANT  07/27/14   LOOP REVEAL LINQ [id number] - [id number]  . loop recorder removed 2017    . TEE WITHOUT CARDIOVERSION N/A 07/27/2014   Procedure: TRANSESOPHAGEAL ECHOCARDIOGRAM (TEE);  Surgeon: Chrystie NoseKenneth C Hilty, MD;  [facility];  Service: Cardiovascular;  Laterality: N/A;   Social History   Occupational History  . Occupation: former Merchandiser, retailmeat cutter    Comment: disabled due to CVA  Tobacco Use  . Smoking status: Current Every Day Smoker    Packs/day: 0.50    Years: 40.00    Pack years: 20.00    Types: Cigarettes  . Smokeless tobacco: Never Used  . Tobacco comment: cut back after stroke, but increased again due to depression  Substance and Sexual Activity  . Alcohol use: Not Currently  . Drug  use: Yes    Types: Marijuana    Comment: trying to help his appetite  . Sexual activity: Yes    Partners: Female

## 2017-10-14 ENCOUNTER — Telehealth (INDEPENDENT_AMBULATORY_CARE_PROVIDER_SITE_OTHER): Payer: Self-pay | Admitting: Orthopaedic Surgery

## 2017-10-14 NOTE — Telephone Encounter (Signed)
Order in, patient scheduled 10/27/17 at 2:00 pm

## 2017-10-14 NOTE — Telephone Encounter (Signed)
[facility]. Please call patient with any questions.

## 2017-10-20 DIAGNOSIS — E785 Hyperlipidemia, unspecified: Secondary | ICD-10-CM | POA: Diagnosis not present

## 2017-10-20 DIAGNOSIS — G8114 Spastic hemiplegia affecting left nondominant side: Secondary | ICD-10-CM | POA: Diagnosis not present

## 2017-10-20 DIAGNOSIS — I10 Essential (primary) hypertension: Secondary | ICD-10-CM | POA: Diagnosis not present

## 2017-10-20 DIAGNOSIS — I69398 Other sequelae of cerebral infarction: Secondary | ICD-10-CM | POA: Diagnosis not present

## 2017-10-20 DIAGNOSIS — R414 Neurologic neglect syndrome: Secondary | ICD-10-CM | POA: Diagnosis not present

## 2017-10-20 DIAGNOSIS — I480 Paroxysmal atrial fibrillation: Secondary | ICD-10-CM | POA: Diagnosis not present

## 2017-10-20 DIAGNOSIS — Z23 Encounter for immunization: Secondary | ICD-10-CM | POA: Diagnosis not present

## 2017-10-25 ENCOUNTER — Ambulatory Visit (INDEPENDENT_AMBULATORY_CARE_PROVIDER_SITE_OTHER): Payer: Medicare Other | Admitting: Orthopaedic Surgery

## 2017-10-27 ENCOUNTER — Other Ambulatory Visit (INDEPENDENT_AMBULATORY_CARE_PROVIDER_SITE_OTHER): Payer: Self-pay | Admitting: Orthopaedic Surgery

## 2017-10-27 ENCOUNTER — Ambulatory Visit
Admission: RE | Admit: 2017-10-27 | Discharge: 2017-10-27 | Disposition: A | Discharge status: Home / Self Care | Payer: Medicare Other | Source: Ambulatory Visit | Attending: Orthopaedic Surgery | Admitting: Orthopaedic Surgery

## 2017-10-27 DIAGNOSIS — G8929 Other chronic pain: Secondary | ICD-10-CM

## 2017-10-27 DIAGNOSIS — M5442 Lumbago with sciatica, left side: Principal | ICD-10-CM

## 2017-10-27 DIAGNOSIS — M47817 Spondylosis without myelopathy or radiculopathy, lumbosacral region: Secondary | ICD-10-CM | POA: Diagnosis not present

## 2017-10-27 MED ORDER — METHYLPREDNISOLONE ACETATE 40 MG/ML INJ SUSP (RADIOLOG
120.0000 mg | Freq: Once | INTRAMUSCULAR | Status: AC
  Administered 2017-10-27: 120 mg via EPIDURAL

## 2017-10-27 MED ORDER — IOPAMIDOL (ISOVUE-M 200) INJECTION 41%
1.0000 mL | Freq: Once | INTRAMUSCULAR | Status: AC
  Administered 2017-10-27: 1 mL via EPIDURAL

## 2017-10-27 NOTE — Discharge Instructions (Signed)

## 2017-10-29 ENCOUNTER — Ambulatory Visit (INDEPENDENT_AMBULATORY_CARE_PROVIDER_SITE_OTHER): Payer: Medicare Other | Admitting: Orthopaedic Surgery

## 2017-11-11 ENCOUNTER — Ambulatory Visit (INDEPENDENT_AMBULATORY_CARE_PROVIDER_SITE_OTHER): Payer: Medicare Other | Admitting: Orthopaedic Surgery

## 2017-11-15 ENCOUNTER — Other Ambulatory Visit: Payer: Medicare Other

## 2017-11-16 DIAGNOSIS — H25813 Combined forms of age-related cataract, bilateral: Secondary | ICD-10-CM | POA: Diagnosis not present

## 2017-11-16 DIAGNOSIS — H47011 Ischemic optic neuropathy, right eye: Secondary | ICD-10-CM | POA: Diagnosis not present

## 2017-11-22 ENCOUNTER — Ambulatory Visit (INDEPENDENT_AMBULATORY_CARE_PROVIDER_SITE_OTHER): Payer: Medicare Other | Admitting: Orthopaedic Surgery

## 2017-11-22 ENCOUNTER — Encounter (INDEPENDENT_AMBULATORY_CARE_PROVIDER_SITE_OTHER): Payer: Self-pay | Admitting: Orthopaedic Surgery

## 2017-11-22 VITALS — BP 117/72 | HR 59 | Ht 66.0 in | Wt 150.0 lb

## 2017-11-22 DIAGNOSIS — M5442 Lumbago with sciatica, left side: Secondary | ICD-10-CM

## 2017-11-22 DIAGNOSIS — M79605 Pain in left leg: Secondary | ICD-10-CM

## 2017-11-22 DIAGNOSIS — G8929 Other chronic pain: Secondary | ICD-10-CM | POA: Diagnosis not present

## 2017-11-22 NOTE — Progress Notes (Signed)
Office Visit Note   Patient: [name]           Date of Birth: [birth year]           MRN: [id number] Visit Date: 11/22/2017              Requested by: Porfirio Oar, PA [address] [place], [place] [address] PCP: Porfirio Oar, PA   Assessment & Plan: Visit Diagnoses:  1. Pain in left leg   2. Chronic left-sided low back pain with left-sided sciatica     Plan: [name] has had several epidural steroid injections with excellent relief of his left-sided low back and left leg pain.  The problem is that the pain recurs.  Prior MRI scan reveals a left extraforaminal disc protrusion at L4-5 that I believe is causing his pain.  He has had a prior CVA with some residual issues on the left.  His point I think it is worth having Dr. Ophelia Charter evaluate him for possible surgery long discussion with [name] to proceed.  Office visit over 30 minutes predominant counseling  Follow-Up Instructions: Return appt with Dr Ophelia Charter.   Orders:  No orders of the defined types were placed in this encounter.  No orders of the defined types were placed in this encounter.     Procedures: No procedures performed   Clinical Data: No additional findings.   Subjective: Chief Complaint  Patient presents with  . Follow-up    1 mo f/u still not better but the shot didnt work in the left hip  MRI scan revealed an extraforaminal disc protrusion at L4-5.  He has had 2 epidural steroid injections both of which have provided significant relief of his pain on a temporary basis.  He continues to have left-sided low back pain buttock pain and left hip discomfort.  He does have some residual problems related to his stroke in the left upper extremity and left lower extremity.  He walks without ambulatory aid.  He can distinguish the difference between the residual stroke problems and the discomfort in his back and left leg  HPI  Review of Systems  Constitutional: Negative for fatigue and fever.  HENT:  Negative for ear pain.   Eyes: Negative for pain.  Respiratory: Negative for cough and shortness of breath.   Cardiovascular: Negative for leg swelling.  Gastrointestinal: Negative for constipation and diarrhea.  Genitourinary: Negative for difficulty urinating.  Musculoskeletal: Negative for back pain and neck pain.  Skin: Negative for rash.  Allergic/Immunologic: Negative for food allergies.  Neurological: Positive for weakness. Negative for numbness.  Hematological: Does not bruise/bleed easily.  Psychiatric/Behavioral: Negative for sleep disturbance.     Objective: Vital Signs: BP 117/72 (BP Location: Right Arm, Patient Position: Sitting, Cuff Size: Normal)   Pulse (!) 59   Ht 5\' 6"  (1.676 m)   Wt 150 lb (68 kg)   BMI 24.21 kg/m   Physical Exam  Constitutional: He is oriented to person, place, and time. He appears well-developed and well-nourished.  HENT:  Mouth/Throat: Oropharynx is clear and moist.  Eyes: Pupils are equal, round, and reactive to light. EOM are normal.  Pulmonary/Chest: Effort normal.  Neurological: He is alert and oriented to person, place, and time.  Skin: Skin is warm and dry.  Psychiatric: He has a normal mood and affect. His behavior is normal.    Ortho Exam some posturing left upper extremity related to his stroke.  Increased reflexes left lower extremity compared to the  right.  Does not use any ambulatory aid.  Straight leg raise negative.  Mild percussible tenderness lower lumbar spine more to the left  Specialty Comments:  No specialty comments available.  Imaging: No results found.   PMFS History: Patient Active Problem List   Diagnosis Date Noted  . Chronic left-sided low back pain with left-sided sciatica 11/22/2017  . Adhesive capsulitis of left shoulder 08/02/2017  . Pain in left leg 08/02/2017  . Gout 05/12/2017  . Chronic left shoulder pain 06/13/2015  . Neuropathic pain of shoulder 06/13/2015  . Acromioclavicular joint  arthritis 03/26/2015  . PAF (paroxysmal atrial fibrillation) (HCC) 03/20/2015  . Leukocytosis 03/20/2015  . Hematemesis 03/19/2015  . Depression due to old stroke 02/26/2015  . Hemiparesis and alteration of sensations as late effects of stroke (HCC) 10/29/2014  . Hypersomnia with sleep apnea 10/29/2014  . Nicotine use disorder 10/29/2014  . Left anterior shoulder pain 10/23/2014  . Atrial fibrillation (HCC) 09/06/2014  . Left spastic hemiparesis (HCC) 08/07/2014  . Left-sided neglect 08/07/2014  . History of stroke 08/01/2014  . Tobacco dependence   . Carotid artery occlusion with infarction (HCC)   . History of ETOH abuse   . Hyperlipidemia   . Essential hypertension, benign 10/11/2013   Past Medical History:  Diagnosis Date  . AKI (acute kidney injury) (HCC) 03/20/2015  . Alcohol use with alcohol-induced mood disorder (HCC)    quit almost all ETOH in 07/2014, previously drank 18 to 20 beers daily.   . Alterations of sensations following CVA (cerebrovascular accident) 09/10/2014  . Arthritis   . Colon polyps 2011   Hyperplastic and 1 tubular adenoma.  Dr Loreta Ave  . 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 identified on TEE at time of stroke  . Embolic stroke involving right middle cerebral artery (HCC) 10/29/2014  . Gout   . Hyperlipidemia   . Hypertension   . Paroxysmal atrial fibrillation (HCC)    a. identified on LINQ 08/2014  . Stroke (HCC) 07/25/14   a. s/p MDT ILR implant.  on xarelto    Family History  Problem Relation Age of Onset  . Cancer Father        leukemia  . Heart attack Mother   . Heart disease Brother   . Heart attack Brother 5       CABG, stenting  . Hypertension Brother   . Alcohol abuse Brother     Past Surgical History:  Procedure Laterality Date  . COLONOSCOPY  2011   Dr Loreta Ave.   . EP IMPLANTABLE DEVICE N/A 07/27/2014   Procedure: Loop Recorder Insertion;  Surgeon: Marinus Maw, MD;  Location: [facility];  Service: Cardiovascular;  Laterality: N/A;  .  EP IMPLANTABLE DEVICE N/A 12/20/2014   Procedure: Loop Recorder Removal;  Surgeon: Marinus Maw, MD;  Location: [facility];  Service: Cardiovascular;  Laterality: N/A;  . ESOPHAGOGASTRODUODENOSCOPY N/A 03/21/2015   Procedure: ESOPHAGOGASTRODUODENOSCOPY (EGD);  Surgeon: Jeani Hawking, MD;  Location[facility] ENDOSCOPY;  Service: Endoscopy;  Laterality: N/A;  . LOOP RECORDER IMPLANT  07/27/14   LOOP REVEAL LINQ [id number]  . loop recorder removed 2017    . TEE WITHOUT CARDIOVERSION N/A 07/27/2014   Procedure: TRANSESOPHAGEAL ECHOCARDIOGRAM (TEE);  Surgeon: Chrystie Nose, MD;  Location: [facility] ENDOSCOPY;  Service: Cardiovascular;  Laterality: N/A;   Social History   Occupational History  . Occupation: former Merchandiser, retail    Comment: disabled due to CVA  Tobacco Use  . Smoking  status: Current Every Day Smoker    Packs/day: 0.50    Years: 40.00    Pack years: 20.00    Types: Cigarettes  . Smokeless tobacco: Never Used  . Tobacco comment: cut back after stroke, but increased again due to depression  Substance and Sexual Activity  . Alcohol use: Not Currently  . Drug use: Yes    Types: Marijuana    Comment: trying to help his appetite  . Sexual activity: Yes    Partners: Female

## 2017-12-06 ENCOUNTER — Telehealth: Payer: Self-pay | Admitting: Pharmacist

## 2017-12-06 NOTE — Telephone Encounter (Signed)
LMOM to follow up as missed appt for lipid/hepatic panel on atorvastatin and ezetimibe.

## 2017-12-10 ENCOUNTER — Encounter (INDEPENDENT_AMBULATORY_CARE_PROVIDER_SITE_OTHER): Payer: Self-pay | Admitting: Orthopaedic Surgery

## 2017-12-10 ENCOUNTER — Ambulatory Visit (INDEPENDENT_AMBULATORY_CARE_PROVIDER_SITE_OTHER): Payer: Medicare Other | Admitting: Orthopaedic Surgery

## 2017-12-10 DIAGNOSIS — M5126 Other intervertebral disc displacement, lumbar region: Secondary | ICD-10-CM

## 2017-12-10 NOTE — Progress Notes (Signed)
Office Visit Note   Patient: [name]           Date of Birth: 1955-08-07           MRN: [id number] Visit Date: 12/10/2017              Requested by: Porfirio Oar, PA [address] [place], [place] [address] PCP: Porfirio Oar, PA   Assessment & Plan: Visit Diagnoses:  1. Protrusion of lumbar intervertebral disc           Far lateral left L4-5 contacting the nerve root without displacement.     Plan: I discussed with patient his wife they both need to quit smoking start walking together for exercise.  Currently does not have any nerve root tension signs and no significant nerve root displacement.  If he develops progressive weakness and he would be a good operative candidate but at present I would not recommend surgical intervention .  I can check him back again in 6 months if he develops increased symptoms he will call.  Follow-Up Instructions: Return in about 6 months (around 06/11/2018).   Orders:  No orders of the defined types were placed in this encounter.  No orders of the defined types were placed in this encounter.     Procedures: No procedures performed   Clinical Data: No additional findings.   Subjective: Chief Complaint  Patient presents with  . Lower Back - Pain    HPI 62 year old male here for surgical consideration for a left L4-5 extraforaminal disc protrusion.  Patient had a right MCA infarct in June 2016 with some residual left body symptoms.  Since his CVAs had some clawing of his left toes.  Epidural injection x2 gave him a few weeks of relief of his left leg symptoms.  He is taking Neurontin.  His wife is with him today.  MRI scan from 08/12/2017 is available for review.  Review of Systems positive for atrial fib chronic smoking history of gout, CVA.   Objective: Vital Signs: BP 114/62   Pulse 69   Ht 5\' 6"  (1.676 m)   Wt 150 lb (68 kg)   BMI 24.21 kg/m   Physical Exam  Constitutional: He is oriented to person, place, and  time. He appears well-developed and well-nourished.  HENT:  Head: Normocephalic and atraumatic.  Eyes: Pupils are equal, round, and reactive to light. EOM are normal.  Neck: No tracheal deviation present. No thyromegaly present.  Cardiovascular: Normal rate.  Pulmonary/Chest: Effort normal. He has no wheezes.  Abdominal: Soft. Bowel sounds are normal.  Neurological: He is alert and oriented to person, place, and time.  Skin: Skin is warm and dry. Capillary refill takes less than 2 seconds.  Psychiatric: He has a normal mood and affect. His behavior is normal. Judgment and thought content normal.    Ortho Exam patient has some decreased sensation left hand some difficulty with coordination has difficulty getting fingers all the way flexion of the palm in a relaxed position and keeps his arm extended and in slight internal rotation.  Ambulates with the toes and o'clock position.  Negative straight leg raising to 90 degrees negative popliteal compression test no sciatic notch tenderness.  Some pain with pressure over the L4-5 interspace and some mild back and leg pain with percussion.  Anterior tib and EHL take good resistive testing.  Specialty Comments:  No specialty comments available.  ImagingCLINICAL DATA:  Initial evaluation for low back pain with left hip pain  and difficulty walking for 3 years.  EXAM: MRI LUMBAR SPINE WITHOUT CONTRAST  TECHNIQUE: Multiplanar, multisequence MR imaging of the lumbar spine was performed. No intravenous contrast was administered.  COMPARISON:  Prior radiograph from 08/02/2017  FINDINGS: Segmentation: Normal segmentation. Lowest well-formed disc labeled the L5-S1 level.  Alignment:  Dextroscoliosis.  Trace retrolisthesis of L2 on L3.  Vertebrae: Vertebral body heights maintained without evidence for acute or chronic fracture. Bone marrow signal intensity within normal limits. Few scattered hemangiomas noted, most prominent of which  located in the L3 and L4 vertebral bodies. No worrisome osseous lesions.  Conus medullaris and cauda equina: Conus extends to the L1 level. Conus and cauda equina appear normal.  Paraspinal and other soft tissues: Paraspinous soft tissues within normal limits. Atherosclerotic change noted within the intra-abdominal aorta without aneurysm. Few scattered subcentimeter cyst noted within the kidneys bilaterally. Visualized visceral structures otherwise unremarkable.  Disc levels:  L1-2:  Unremarkable.  L2-3: Disc desiccation with mild annular disc bulge. Disc bulging slightly asymmetric to the right without focal disc herniation. No stenosis or impingement.  L3-4: Disc desiccation with mild disc bulge. No significant stenosis. No impingement.  L4-5: Diffuse disc bulge with disc desiccation. Superimposed left extraforaminal disc protrusion with annular fissure, contacting the exiting left L4 nerve root as it courses out of the left neural foramen (series 6, image 35). Mild facet and ligament flavum hypertrophy, greater on the left. Resultant mild left lateral recess narrowing without significant spinal stenosis. Mild bilateral L4 foraminal narrowing.  L5-S1: Normal interspace. Mild to moderate right-sided facet hypertrophy. No significant spinal stenosis. Mild right L5 foraminal narrowing.  IMPRESSION: 1. Left extraforaminal disc protrusion at L4-5, contacting the exiting left L4 nerve root as it courses out of the left neural foramen. Finding could result in left sided symptoms. 2. Additional mild disc bulging at L2-3 and L3-4 without significant stenosis or neural impingement. 3. Mild multilevel facet hypertrophy, most notable at L4-5 on the left and L5-S1 on the right.   Electronically Signed   By: Rise Mu M.D.   On: 08/12/2017 21:19     PMFS History: Patient Active Problem List   Diagnosis Date Noted  . Protrusion of lumbar intervertebral  disc 12/10/2017  . Chronic left-sided low back pain with left-sided sciatica 11/22/2017  . Adhesive capsulitis of left shoulder 08/02/2017  . Pain in left leg 08/02/2017  . Gout 05/12/2017  . Chronic left shoulder pain 06/13/2015  . Neuropathic pain of shoulder 06/13/2015  . Acromioclavicular joint arthritis 03/26/2015  . PAF (paroxysmal atrial fibrillation) (HCC) 03/20/2015  . Leukocytosis 03/20/2015  . Hematemesis 03/19/2015  . Depression due to old stroke 02/26/2015  . Hemiparesis and alteration of sensations as late effects of stroke (HCC) 10/29/2014  . Hypersomnia with sleep apnea 10/29/2014  . Nicotine use disorder 10/29/2014  . Left anterior shoulder pain 10/23/2014  . Atrial fibrillation (HCC) 09/06/2014  . Left spastic hemiparesis (HCC) 08/07/2014  . Left-sided neglect 08/07/2014  . History of stroke 08/01/2014  . Tobacco dependence   . Carotid artery occlusion with infarction (HCC)   . History of ETOH abuse   . Hyperlipidemia   . Essential hypertension, benign 10/11/2013   Past Medical History:  Diagnosis Date  . AKI (acute kidney injury) (HCC) 03/20/2015  . Alcohol use with alcohol-induced mood disorder (HCC)    quit almost all ETOH in 07/2014, previously drank 18 to 20 beers daily.   . Alterations of sensations following CVA (cerebrovascular accident) 09/10/2014  . Arthritis   .  Colon polyps 2011   Hyperplastic and 1 tubular adenoma.  Dr Loreta Ave  . 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 identified on TEE at time of stroke  . Embolic stroke involving right middle cerebral artery (HCC) 10/29/2014  . Gout   . Hyperlipidemia   . Hypertension   . Paroxysmal atrial fibrillation (HCC)    a. identified on LINQ 08/2014  . Stroke (HCC) 07/25/14   a. s/p MDT ILR implant.  on xarelto    Family History  Problem Relation Age of Onset  . Cancer Father        leukemia  . Heart attack Mother   . Heart disease Brother   . Heart attack Brother 29       CABG, stenting  . Hypertension Brother    . Alcohol abuse Brother     Past Surgical History:  Procedure Laterality Date  . COLONOSCOPY  2011   Dr Loreta Ave.   . EP IMPLANTABLE DEVICE N/A 07/27/2014   Procedure: Loop Recorder Insertion;  Surgeon: Marinus Maw, MD;  Location: [facility];  Service: Cardiovascular;  Laterality: N/A;  . EP IMPLANTABLE DEVICE N/A 12/20/2014   Procedure: Loop Recorder Removal;  Surgeon: Marinus Maw, MD;  Location: [facility];  Service: Cardiovascular;  Laterality: N/A;  . ESOPHAGOGASTRODUODENOSCOPY N/A 03/21/2015   Procedure: ESOPHAGOGASTRODUODENOSCOPY (EGD);  Surgeon: Jeani Hawking, MD;  Location[facility] ENDOSCOPY;  Service: Endoscopy;  Laterality: N/A;  . LOOP RECORDER IMPLANT  07/27/14   LOOP REVEAL LINQ [id number] - [id number]  . loop recorder removed 2017    . TEE WITHOUT CARDIOVERSION N/A 07/27/2014   Procedure: TRANSESOPHAGEAL ECHOCARDIOGRAM (TEE);  Surgeon: Chrystie Nose, MD;  [facility] ENDOSCOPY;  Service: Cardiovascular;  Laterality: N/A;   Social History   Occupational History  . Occupation: former Merchandiser, retail    Comment: disabled due to CVA  Tobacco Use  . Smoking status: Current Every Day Smoker    Packs/day: 0.50    Years: 40.00    Pack years: 20.00    Types: Cigarettes  . Smokeless tobacco: Never Used  . Tobacco comment: cut back after stroke, but increased again due to depression  Substance and Sexual Activity  . Alcohol use: Not Currently  . Drug use: Yes    Types: Marijuana    Comment: trying to help his appetite  . Sexual activity: Yes    Partners: Female

## 2017-12-13 NOTE — Telephone Encounter (Signed)
Spoke with Marylu Lund and rescheduled missed lab appt. She will bring him for fasting labs on Wednesday.

## 2017-12-15 ENCOUNTER — Other Ambulatory Visit: Payer: Medicare Other | Admitting: *Deleted

## 2017-12-15 DIAGNOSIS — E785 Hyperlipidemia, unspecified: Secondary | ICD-10-CM | POA: Diagnosis not present

## 2017-12-15 LAB — LIPID PANEL
CHOLESTEROL TOTAL: 137 mg/dL (ref 100–199)
Chol/HDL Ratio: 4.2 ratio (ref 0.0–5.0)
HDL: 33 mg/dL — AB (ref >39)
LDL CALC: 84 mg/dL (ref 0–99)
TRIGLYCERIDES: 101 mg/dL (ref 0–149)
VLDL Cholesterol Cal: 20 mg/dL (ref 5–40)

## 2017-12-15 LAB — HEPATIC FUNCTION PANEL
ALBUMIN: 4.2 g/dL (ref 3.6–4.8)
ALK PHOS: 80 IU/L (ref 39–117)
ALT: 20 IU/L (ref 0–44)
AST: 17 IU/L (ref 0–40)
Bilirubin Total: 0.5 mg/dL (ref 0.0–1.2)
Bilirubin, Direct: 0.11 mg/dL (ref 0.00–0.40)
Total Protein: 7.2 g/dL (ref 6.0–8.5)

## 2018-01-19 DIAGNOSIS — Z0000 Encounter for general adult medical examination without abnormal findings: Secondary | ICD-10-CM | POA: Diagnosis not present

## 2018-01-19 DIAGNOSIS — Z23 Encounter for immunization: Secondary | ICD-10-CM | POA: Diagnosis not present

## 2018-01-19 DIAGNOSIS — I517 Cardiomegaly: Secondary | ICD-10-CM | POA: Diagnosis not present

## 2018-01-19 DIAGNOSIS — M5126 Other intervertebral disc displacement, lumbar region: Secondary | ICD-10-CM | POA: Diagnosis not present

## 2018-01-19 DIAGNOSIS — I69398 Other sequelae of cerebral infarction: Secondary | ICD-10-CM | POA: Diagnosis not present

## 2018-01-19 DIAGNOSIS — M7502 Adhesive capsulitis of left shoulder: Secondary | ICD-10-CM | POA: Diagnosis not present

## 2018-01-19 DIAGNOSIS — R001 Bradycardia, unspecified: Secondary | ICD-10-CM | POA: Diagnosis not present

## 2018-04-20 DIAGNOSIS — E785 Hyperlipidemia, unspecified: Secondary | ICD-10-CM | POA: Diagnosis not present

## 2018-04-20 DIAGNOSIS — I10 Essential (primary) hypertension: Secondary | ICD-10-CM | POA: Diagnosis not present

## 2018-04-20 DIAGNOSIS — I63232 Cerebral infarction due to unspecified occlusion or stenosis of left carotid arteries: Secondary | ICD-10-CM | POA: Diagnosis not present

## 2018-04-20 DIAGNOSIS — I69398 Other sequelae of cerebral infarction: Secondary | ICD-10-CM | POA: Diagnosis not present

## 2018-04-20 DIAGNOSIS — I480 Paroxysmal atrial fibrillation: Secondary | ICD-10-CM | POA: Diagnosis not present

## 2018-04-20 DIAGNOSIS — G8114 Spastic hemiplegia affecting left nondominant side: Secondary | ICD-10-CM | POA: Diagnosis not present

## 2018-05-01 DIAGNOSIS — I951 Orthostatic hypotension: Secondary | ICD-10-CM | POA: Diagnosis not present

## 2018-05-01 DIAGNOSIS — I959 Hypotension, unspecified: Secondary | ICD-10-CM | POA: Diagnosis not present

## 2018-05-01 DIAGNOSIS — R4020 Unspecified coma: Secondary | ICD-10-CM | POA: Diagnosis not present

## 2018-05-01 DIAGNOSIS — R55 Syncope and collapse: Secondary | ICD-10-CM | POA: Diagnosis not present

## 2018-05-01 DIAGNOSIS — I10 Essential (primary) hypertension: Secondary | ICD-10-CM | POA: Diagnosis not present

## 2018-05-01 DIAGNOSIS — Z7902 Long term (current) use of antithrombotics/antiplatelets: Secondary | ICD-10-CM | POA: Diagnosis not present

## 2018-06-10 ENCOUNTER — Ambulatory Visit (INDEPENDENT_AMBULATORY_CARE_PROVIDER_SITE_OTHER): Payer: Medicare Other | Admitting: Orthopaedic Surgery

## 2018-06-10 ENCOUNTER — Encounter (INDEPENDENT_AMBULATORY_CARE_PROVIDER_SITE_OTHER): Payer: Self-pay | Admitting: Orthopaedic Surgery

## 2018-06-10 ENCOUNTER — Other Ambulatory Visit: Payer: Self-pay

## 2018-06-10 VITALS — Ht 65.0 in | Wt 155.0 lb

## 2018-06-10 DIAGNOSIS — M5126 Other intervertebral disc displacement, lumbar region: Secondary | ICD-10-CM

## 2018-06-10 NOTE — Progress Notes (Signed)
Office Visit Note   Patient: [name]           Date of Birth: Feb 11, 1956           MRN: [id number] Visit Date: 06/10/2018              Requested by: Porfirio Oar, PA [address] [place], [place] [address] PCP: Porfirio Oar, PA   Assessment & Plan: Visit Diagnoses:  1. Protrusion of lumbar intervertebral disc     Plan: Patient has L4-5 extraforaminal disc protrusion which is not causing significant compression and is not displacing the nerve root.  He does have some facet arthropathy on the left at L4-5 and also on the right at L5-S1 but currently did not have any right-sided symptoms.  We discussed walking program for smoking cessation.  If he had more significant nerve displacement or had nerve tension signs we discussed that he might be a surgical candidate.  He will continue to work on walking try to stop smoking and I will plan on rechecking her back in 6 months.  He gets increased symptoms he can let us know.  Follow-Up Instructions: Return in about 6 months (around 12/10/2018).   Orders:  No orders of the defined types were placed in this encounter.  No orders of the defined types were placed in this encounter.     Procedures: No procedures performed   Clinical Data: No additional findings.   Subjective: Chief Complaint  Patient presents with   Lower Back - Pain, Follow-up    HPI 63 year old male returns with ongoing problems with small far lateral disc protrusion on the left at L4-5.  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  He has had a total of 3 epidural injections he states it only lasted for a day or so and then he had recurrence of symptoms.  MRI scan June 2019 showed left extraforaminal disc protrusion small just contacting the nerve root without displacement.  No significant findings at other levels.  He did have some facet hypertrophy most notable at L4-5 more on the left than right.His pain is primarily  posteriorly in the hip.  Patient states he is here to discuss possible surgery.  He is on Xarelto 20 mg due to his previous CVA.  Review of Systems is systems positive for he is a capsulitis left shoulder ,left L4-5 extraforaminal disc protrusion.  Positive hypertension hyperlipidemia carotid artery occlusion with infarct.  History of alcohol abuse continues to smoke we discussed smoking sensation.  Previous CVA.  Atrial fib and hyperlipidemia.   Objective: Vital Signs: Ht  (1.651 m)    Wt 155 lb (70.3 kg)    BMI 25.79 kg/m   Physical Exam Constitutional:      Appearance: He is well-developed.  HENT:     Head: Normocephalic and atraumatic.  Eyes:     Pupils: Pupils are equal, round, and reactive to light.  Neck:     Thyroid: No thyromegaly.     Trachea: No tracheal deviation.  Cardiovascular:     Rate and Rhythm: Normal rate.  Pulmonary:     Effort: Pulmonary effort is normal.     Breath sounds: No wheezing.  Abdominal:     General: Bowel sounds are normal.     Palpations: Abdomen is soft.  Skin:    General: Skin is warm and dry.     Capillary Refill: Capillary refill takes less than 2 seconds.  Neurological:  Mental Status: He is alert and oriented to person, place, and time.  Psychiatric:        Behavior: Behavior normal.        Thought Content: Thought content normal.        Judgment: Judgment normal.     Ortho Exam patient has intact knee and ankle jerk.  Anterior tib gastrocsoleus is strong right and left.  No pain with hip range of motion internal and external rotation negative straight leg raising 90 degrees right and left.  Specialty Comments:  No specialty comments available.  Imaging: No results found.   PMFS History: Patient Active Problem List   Diagnosis Date Noted   Protrusion of lumbar intervertebral disc 12/10/2017   Chronic left-sided low back pain with left-sided sciatica 11/22/2017   Adhesive capsulitis of left shoulder 08/02/2017    Pain in left leg 08/02/2017   Gout 05/12/2017   Chronic left shoulder pain 06/13/2015   Neuropathic pain of shoulder 06/13/2015   Acromioclavicular joint arthritis 03/26/2015   PAF (paroxysmal atrial fibrillation) (HCC) 03/20/2015   Leukocytosis 03/20/2015   Hematemesis 03/19/2015   Depression due to old stroke 02/26/2015   Hemiparesis and alteration of sensations as late effects of stroke (HCC) 10/29/2014   Hypersomnia with sleep apnea 10/29/2014   Nicotine use disorder 10/29/2014   Left anterior shoulder pain 10/23/2014   Atrial fibrillation (HCC) 09/06/2014   Left spastic hemiparesis (HCC) 08/07/2014   Left-sided neglect 08/07/2014   History of stroke 08/01/2014   Tobacco dependence    Carotid artery occlusion with infarction (HCC)    History of ETOH abuse    Hyperlipidemia    Essential hypertension, benign 10/11/2013   Past Medical History:  Diagnosis Date   AKI (acute kidney injury) (HCC) 03/20/2015   Alcohol use with alcohol-induced mood disorder (HCC)    quit almost all ETOH in 07/2014, previously drank 18 to 20 beers daily.    Alterations of sensations following CVA (cerebrovascular accident) 09/10/2014   Arthritis    Colon polyps 2011   Hyperplastic and 1 tubular adenoma.  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 identified on TEE at time of stroke   Embolic stroke involving right middle cerebral artery (HCC) 10/29/2014   Gout    Hyperlipidemia    Hypertension    Paroxysmal atrial fibrillation (HCC)    a. identified on LINQ 08/2014   Stroke (HCC) 07/25/14   a. s/p MDT ILR implant.  on xarelto    Family History  Problem Relation Age of Onset   Cancer Father        leukemia   Heart attack Mother    Heart disease Brother    Heart attack Brother [phone]       CABG, stenting   Hypertension Brother    Alcohol abuse Brother     Past Surgical History:  Procedure Laterality Date   COLONOSCOPY  2011   Dr Loreta AveMann.    EP IMPLANTABLE DEVICE  N/A 07/27/2014   Procedure: Loop Recorder Insertion;  Surgeon: Marinus MawGregg W Taylor, MD;  Location: [facility];  Service: Cardiovascular;  Laterality: N/A;   EP IMPLANTABLE DEVICE N/A 12/20/2014   Procedure: Loop Recorder Removal;  Surgeon: Marinus MawGregg W Taylor, MD;  Location: [facility];  Service: Cardiovascular;  Laterality: N/A;   ESOPHAGOGASTRODUODENOSCOPY N/A 03/21/2015   Procedure: ESOPHAGOGASTRODUODENOSCOPY (EGD);  Surgeon: Jeani HawkingPatrick Hung, MD;  [facility];  Service: Endoscopy;  Laterality: N/A;   LOOP RECORDER IMPLANT  07/27/14  LOOP REVEAL LINQ [id number] - [id number]   loop recorder removed 2017     TEE WITHOUT CARDIOVERSION N/A 07/27/2014   Procedure: TRANSESOPHAGEAL ECHOCARDIOGRAM (TEE);  Surgeon: Chrystie Nose, MD;  Location: [facility] ENDOSCOPY;  Service: Cardiovascular;  Laterality: N/A;   Social History   Occupational History   Occupation: former Merchandiser, retail    Comment: disabled due to CVA  Tobacco Use   Smoking status: Current Every Day Smoker    Packs/day: 0.50    Years: 40.00    Pack years: 20.00    Types: Cigarettes   Smokeless tobacco: Never Used   Tobacco comment: cut back after stroke, but increased again due to depression  Substance and Sexual Activity   Alcohol use: Not Currently   Drug use: Yes    Types: Marijuana    Comment: trying to help his appetite   Sexual activity: Yes    Partners: Female

## 2018-06-16 ENCOUNTER — Telehealth: Payer: Self-pay | Admitting: Internal Medicine

## 2018-06-16 NOTE — Telephone Encounter (Signed)
New message   [facility] for pt to call back and schedule doxemity visit with Dr. Josie Dixon due recall. Will offer May 5th, need to speak with pt when returns call.

## 2018-10-12 ENCOUNTER — Other Ambulatory Visit: Payer: Self-pay

## 2018-10-12 NOTE — Patient Outreach (Signed)
[facility]) Care Management  10/12/2018  [name] [birth year] [id number]   Medication Adherence call to [name] HIPPA Compliant Voice message left with a call back number. [name] is showing past due on Atorvastatin 80 mg under [name].   [facility] Direct Dial [phone]  Fax [phone] [email or web address]

## 2018-10-25 ENCOUNTER — Other Ambulatory Visit: Payer: Self-pay

## 2018-10-25 NOTE — Patient Outreach (Signed)
[facility]) Care Management  10/25/2018  [name] 1956/01/07 [id number]   Medication Adherence call to [name] HIPPA Compliant Voice message left with a call back number. [name] is showing past due on Atorvastatin 80 mg and Lisinopril 5 mg under [facility].   [facility] Direct Dial [phone]  Fax [phone] [name]

## 2018-12-13 ENCOUNTER — Ambulatory Visit: Payer: Self-pay | Admitting: Orthopaedic Surgery

## 2018-12-14 ENCOUNTER — Ambulatory Visit: Payer: Medicare Other | Admitting: Surgery

## 2019-06-01 ENCOUNTER — Encounter: Payer: Self-pay | Admitting: *Deleted

## 2019-11-02 ENCOUNTER — Emergency Department (HOSPITAL_COMMUNITY): Payer: Medicare Other

## 2019-11-02 ENCOUNTER — Emergency Department (HOSPITAL_COMMUNITY)
Admission: EM | Admit: 2019-11-02 | Discharge: 2019-11-02 | Disposition: A | Discharge status: Home / Self Care | Payer: Medicare Other | Attending: Emergency Medicine | Admitting: Emergency Medicine

## 2019-11-02 ENCOUNTER — Encounter (HOSPITAL_COMMUNITY): Payer: Self-pay | Admitting: Emergency Medicine

## 2019-11-02 DIAGNOSIS — W19XXXA Unspecified fall, initial encounter: Secondary | ICD-10-CM

## 2019-11-02 DIAGNOSIS — M5030 Other cervical disc degeneration, unspecified cervical region: Secondary | ICD-10-CM | POA: Diagnosis not present

## 2019-11-02 DIAGNOSIS — Z79899 Other long term (current) drug therapy: Secondary | ICD-10-CM | POA: Diagnosis not present

## 2019-11-02 DIAGNOSIS — S3993XA Unspecified injury of pelvis, initial encounter: Secondary | ICD-10-CM | POA: Diagnosis not present

## 2019-11-02 DIAGNOSIS — S199XXA Unspecified injury of neck, initial encounter: Secondary | ICD-10-CM | POA: Diagnosis not present

## 2019-11-02 DIAGNOSIS — R55 Syncope and collapse: Secondary | ICD-10-CM | POA: Diagnosis present

## 2019-11-02 DIAGNOSIS — R6889 Other general symptoms and signs: Secondary | ICD-10-CM | POA: Diagnosis not present

## 2019-11-02 DIAGNOSIS — G9389 Other specified disorders of brain: Secondary | ICD-10-CM | POA: Diagnosis not present

## 2019-11-02 DIAGNOSIS — R531 Weakness: Secondary | ICD-10-CM | POA: Diagnosis not present

## 2019-11-02 DIAGNOSIS — Z23 Encounter for immunization: Secondary | ICD-10-CM | POA: Insufficient documentation

## 2019-11-02 DIAGNOSIS — F10120 Alcohol abuse with intoxication, uncomplicated: Secondary | ICD-10-CM | POA: Diagnosis not present

## 2019-11-02 DIAGNOSIS — I10 Essential (primary) hypertension: Secondary | ICD-10-CM | POA: Diagnosis not present

## 2019-11-02 DIAGNOSIS — S6992XA Unspecified injury of left wrist, hand and finger(s), initial encounter: Secondary | ICD-10-CM | POA: Diagnosis not present

## 2019-11-02 DIAGNOSIS — I6782 Cerebral ischemia: Secondary | ICD-10-CM | POA: Diagnosis not present

## 2019-11-02 DIAGNOSIS — J90 Pleural effusion, not elsewhere classified: Secondary | ICD-10-CM | POA: Diagnosis not present

## 2019-11-02 DIAGNOSIS — Z743 Need for continuous supervision: Secondary | ICD-10-CM | POA: Diagnosis not present

## 2019-11-02 DIAGNOSIS — F10920 Alcohol use, unspecified with intoxication, uncomplicated: Secondary | ICD-10-CM

## 2019-11-02 DIAGNOSIS — Y906 Blood alcohol level of 120-199 mg/100 ml: Secondary | ICD-10-CM | POA: Diagnosis not present

## 2019-11-02 DIAGNOSIS — S0990XA Unspecified injury of head, initial encounter: Secondary | ICD-10-CM | POA: Diagnosis not present

## 2019-11-02 LAB — COMPREHENSIVE METABOLIC PANEL
ALT: 17 U/L (ref 0–44)
AST: 22 U/L (ref 15–41)
Albumin: 3.8 g/dL (ref 3.5–5.0)
Alkaline Phosphatase: 85 U/L (ref 38–126)
Anion gap: 13 (ref 5–15)
BUN: 6 mg/dL — ABNORMAL LOW (ref 8–23)
CO2: 19 mmol/L — ABNORMAL LOW (ref 22–32)
Calcium: 9.2 mg/dL (ref 8.9–10.3)
Chloride: 105 mmol/L (ref 98–111)
Creatinine, Ser: 0.95 mg/dL (ref 0.61–1.24)
GFR calc Af Amer: >60 mL/min (ref >60)
GFR calc non Af Amer: >60 mL/min (ref >60)
Glucose, Bld: 88 mg/dL (ref 70–99)
Potassium: 3.3 mmol/L — ABNORMAL LOW (ref 3.5–5.1)
Sodium: 137 mmol/L (ref 135–145)
Total Bilirubin: 0.7 mg/dL (ref 0.3–1.2)
Total Protein: 7.7 g/dL (ref 6.5–8.1)

## 2019-11-02 LAB — PROTIME-INR
INR: 1.1 (ref 0.8–1.2)
Prothrombin Time: 13.9 seconds (ref 11.4–15.2)

## 2019-11-02 LAB — CBC
HCT: 46.1 % (ref 39.0–52.0)
Hemoglobin: 14.8 g/dL (ref 13.0–17.0)
MCH: 30.1 pg (ref 26.0–34.0)
MCHC: 32.1 g/dL (ref 30.0–36.0)
MCV: 93.7 fL (ref 80.0–100.0)
Platelets: 260 10*3/uL (ref 150–400)
RBC: 4.92 MIL/uL (ref 4.22–5.81)
RDW: 13.7 % (ref 11.5–15.5)
WBC: 7.4 10*3/uL (ref 4.0–10.5)
nRBC: 0 % (ref 0.0–0.2)

## 2019-11-02 LAB — I-STAT CHEM 8, ED
BUN: 5 mg/dL — ABNORMAL LOW (ref 8–23)
Calcium, Ion: 1.11 mmol/L — ABNORMAL LOW (ref 1.15–1.40)
Chloride: 104 mmol/L (ref 98–111)
Creatinine, Ser: 1.3 mg/dL — ABNORMAL HIGH (ref 0.61–1.24)
Glucose, Bld: 85 mg/dL (ref 70–99)
HCT: 46 % (ref 39.0–52.0)
Hemoglobin: 15.6 g/dL (ref 13.0–17.0)
Potassium: 3.3 mmol/L — ABNORMAL LOW (ref 3.5–5.1)
Sodium: 140 mmol/L (ref 135–145)
TCO2: 22 mmol/L (ref 22–32)

## 2019-11-02 LAB — SAMPLE TO BLOOD BANK

## 2019-11-02 LAB — ETHANOL: Alcohol, Ethyl (B): 182 mg/dL — ABNORMAL HIGH (ref <10)

## 2019-11-02 LAB — LACTIC ACID, PLASMA: Lactic Acid, Venous: 2.7 mmol/L (ref 0.5–1.9)

## 2019-11-02 MED ORDER — TETANUS-DIPHTH-ACELL PERTUSSIS 5-2.5-18.5 LF-MCG/0.5 IM SUSP
0.5000 mL | Freq: Once | INTRAMUSCULAR | Status: AC
  Administered 2019-11-02: 0.5 mL via INTRAMUSCULAR
  Filled 2019-11-02: qty 0.5

## 2019-11-02 NOTE — ED Triage Notes (Signed)
Patient from home with [facility] after falling approximately two feet onto ground from his porch. Patient states he does not remember falling. History of stroke, baseline left sided weakness. Patient has slurred speech, patient reports his speech sounds normal to him. C-collar in place from EMS. Skin tears to bilateral forearms, bleeding controlled. Patient alert and oriented at this time.

## 2019-11-02 NOTE — Discharge Instructions (Addendum)
You have been evaluated corneal emergency department myself Dr. Christiane Ha Reading as well as attending physician Dr. Gerhard Munch.  You have been evaluated for a fall and loss of consciousnes (passing out) with a very reassuring work-up and no injuries identified.  We do suggest that she follow-up with your primary care doctor in a few days, and it might be advantageous to discuss whether or not you need to continue on Xarelto.  We asked the return for any new or different weakness or numbness or severe headache at any time.

## 2019-11-02 NOTE — ED Provider Notes (Signed)
[facility] EMERGENCY DEPARTMENT Provider Note   CSN: [id number] Arrival date & time: 11/02/19  1853     History Chief Complaint  Patient presents with  . Loss of Consciousness    [name] is a 64 y.o. male past medical history of alcohol abuse, right MCA stroke with left-sided deficits, hypertension, hyperlipidemia, paroxysmal A. fib on Xarelto presenting to the ED today after presumed fall from the porch.  Patient states that he is not sure when or how he fell, did lose consciousness.  Denies complaints other than some pain associated with multiple skin tears.  Does endorse drinking about half of 1/5 of liquor today.  The history is provided by the patient and the EMS personnel.  Loss of Consciousness Episode history:  Single Most recent episode:  Today Progression:  Resolved Chronicity:  New Witnessed: no   Ineffective treatments:  None tried Associated symptoms: no chest pain, no confusion, no dizziness, no fever, no headaches, no palpitations, no shortness of breath and no vomiting   Risk factors: no congenital heart disease, no coronary artery disease and no seizures        Past Medical History:  Diagnosis Date  . AKI (acute kidney injury) (HCC) 03/20/2015  . Alcohol use with alcohol-induced mood disorder (HCC)    quit almost all ETOH in 07/2014, previously drank 18 to 20 beers daily.   . Alterations of sensations following CVA (cerebrovascular accident) 09/10/2014  . Arthritis   . Colon polyps 2011   Hyperplastic and 1 tubular adenoma.  Dr Loreta Ave  . 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 identified on TEE at time of stroke  . Embolic stroke involving right middle cerebral artery (HCC) 10/29/2014  . Gout   . Hyperlipidemia   . Hypertension   . Paroxysmal atrial fibrillation (HCC)    a. identified on LINQ 08/2014  . Stroke (HCC) 07/25/14   a. s/p MDT ILR implant.  on xarelto    Patient Active Problem List   Diagnosis Date Noted  . Protrusion of lumbar  intervertebral disc 12/10/2017  . Chronic left-sided low back pain with left-sided sciatica 11/22/2017  . Adhesive capsulitis of left shoulder 08/02/2017  . Pain in left leg 08/02/2017  . Gout 05/12/2017  . Chronic left shoulder pain 06/13/2015  . Neuropathic pain of shoulder 06/13/2015  . Acromioclavicular joint arthritis 03/26/2015  . PAF (paroxysmal atrial fibrillation) (HCC) 03/20/2015  . Leukocytosis 03/20/2015  . Hematemesis 03/19/2015  . Depression due to old stroke 02/26/2015  . Hemiparesis and alteration of sensations as late effects of stroke (HCC) 10/29/2014  . Hypersomnia with sleep apnea 10/29/2014  . Nicotine use disorder 10/29/2014  . Left anterior shoulder pain 10/23/2014  . Atrial fibrillation (HCC) 09/06/2014  . Left spastic hemiparesis (HCC) 08/07/2014  . Left-sided neglect 08/07/2014  . History of stroke 08/01/2014  . Tobacco dependence   . Carotid artery occlusion with infarction (HCC)   . History of ETOH abuse   . Hyperlipidemia   . Essential hypertension, benign 10/11/2013    Past Surgical History:  Procedure Laterality Date  . COLONOSCOPY  2011   Dr Loreta Ave.   . EP IMPLANTABLE DEVICE N/A 07/27/2014   Procedure: Loop Recorder Insertion;  Surgeon: Marinus Maw, MD;  Location: [facility] INVASIVE CV LAB;  Service: Cardiovascular;  Laterality: N/A;  . EP IMPLANTABLE DEVICE N/A 12/20/2014   Procedure: Loop Recorder Removal;  Surgeon: Marinus Maw, MD;  Location: [facility] INVASIVE CV LAB;  Service: Cardiovascular;  Laterality: N/A;  .  ESOPHAGOGASTRODUODENOSCOPY N/A 03/21/2015   Procedure: ESOPHAGOGASTRODUODENOSCOPY (EGD);  Surgeon: Jeani Hawking, MD;  Location: [facility] ENDOSCOPY;  Service: Endoscopy;  Laterality: N/A;  . LOOP RECORDER IMPLANT  07/27/14   LOOP REVEAL LINQ [id number] - [id number]  . loop recorder removed 2017    . TEE WITHOUT CARDIOVERSION N/A 07/27/2014   Procedure: TRANSESOPHAGEAL ECHOCARDIOGRAM (TEE);  Surgeon: Chrystie Nose, MD;  Location: [facility] ENDOSCOPY;  Service:  Cardiovascular;  Laterality: N/A;       Family History  Problem Relation Age of Onset  . Cancer Father        leukemia  . Heart attack Mother   . Heart disease Brother   . Heart attack Brother 44       CABG, stenting  . Hypertension Brother   . Alcohol abuse Brother     Social History   Tobacco Use  . Smoking status: Current Every Day Smoker    Packs/day: 0.50    Years: 40.00    Pack years: 20.00    Types: Cigarettes  . Smokeless tobacco: Never Used  . Tobacco comment: cut back after stroke, but increased again due to depression  Vaping Use  . Vaping Use: Never used  Substance Use Topics  . Alcohol use: Not Currently  . Drug use: Yes    Types: Marijuana    Comment: trying to help his appetite    Home Medications Prior to Admission medications   Medication Sig Start Date End Date Taking? Authorizing Provider  atorvastatin (LIPITOR) 80 MG tablet TAKE 1 TABLET BY MOUTH  DAILY AT 6 PM 05/12/17   Porfirio Oar, PA  buPROPion (WELLBUTRIN XL) 150 MG 24 hr tablet Take 1 tablet (150 mg total) by mouth daily. 05/12/17   Porfirio Oar, PA  colchicine 0.6 MG tablet Take 1 tablet (0.6 mg total) by mouth 2 (two) times daily as needed (gout). Patient not taking: Reported on 06/10/2018 05/12/17   Porfirio Oar, PA  ezetimibe (ZETIA) 10 MG tablet Take 1 tablet (10 mg total) by mouth daily. 09/14/17 12/13/17  Marinus Maw, MD  ezetimibe (ZETIA) 10 MG tablet  05/24/18   [provider]  gabapentin (NEURONTIN) 300 MG capsule Take 1 capsule (300 mg total) by mouth 3 (three) times daily. 05/12/17   Porfirio Oar, PA  lisinopril (PRINIVIL,ZESTRIL) 5 MG tablet Take 1 tablet (5 mg total) by mouth daily. 05/12/17   Porfirio Oar, PA  rivaroxaban (XARELTO) 20 MG TABS tablet TAKE 1 TABLET(20 MG) BY MOUTH DAILY WITH DINNER 05/12/17   Porfirio Oar, PA  sertraline (ZOLOFT) 100 MG tablet Take 1 tablet (100 mg total) by mouth daily. 07/23/17   Porfirio Oar, PA  traMADol (ULTRAM) 50 MG  tablet Take 1 tablet (50 mg total) by mouth 2 (two) times daily as needed. Patient not taking: Reported on 06/10/2018 05/12/17   Porfirio Oar, PA    Allergies    Patient has no known allergies.  Review of Systems   Review of Systems  Constitutional: Negative for chills and fever.  HENT: Negative for facial swelling and voice change.   Eyes: Negative for redness and visual disturbance.  Respiratory: Negative for cough and shortness of breath.   Cardiovascular: Positive for syncope. Negative for chest pain and palpitations.  Gastrointestinal: Negative for abdominal pain and vomiting.  Genitourinary: Negative for difficulty urinating and dysuria.  Musculoskeletal: Negative for gait problem and joint swelling.       Positive left wrist pain  Skin: Positive for wound. Negative for rash.  Neurological: Negative for dizziness and headaches.  Psychiatric/Behavioral: Negative for confusion and suicidal ideas.    Physical Exam Updated Vital Signs BP 104/78   Pulse 66   Temp 97.9 F (36.6 C) (Oral)   Resp 19   SpO2 98%   Physical Exam Constitutional:      General: He is not in acute distress. HENT:     Head: Normocephalic and atraumatic.     Mouth/Throat:     Mouth: Mucous membranes are moist.     Pharynx: Oropharynx is clear.  Eyes:     General: No scleral icterus.    Pupils: Pupils are equal, round, and reactive to light.  Cardiovascular:     Rate and Rhythm: Normal rate and regular rhythm.     Pulses: Normal pulses.  Pulmonary:     Effort: Pulmonary effort is normal. No respiratory distress.  Abdominal:     General: There is no distension.     Tenderness: There is no abdominal tenderness.  Musculoskeletal:        General: No tenderness or deformity.     Cervical back: Normal range of motion and neck supple.     Comments: Weakness of the left upper extremity, left lower extremity, left facial droop appreciated.  Sensation diminished to the left hemibody as well.    Neurological:     General: No focal deficit present.     Mental Status: He is alert and oriented to person, place, and time.  Psychiatric:        Mood and Affect: Mood normal.        Behavior: Behavior normal.     ED Results / Procedures / Treatments   Labs (all labs ordered are listed, but only abnormal results are displayed) Labs Reviewed  COMPREHENSIVE METABOLIC PANEL - Abnormal; Notable for the following components:      Result Value   Potassium 3.3 (*)    CO2 19 (*)    BUN 6 (*)    All other components within normal limits  ETHANOL - Abnormal; Notable for the following components:   Alcohol, Ethyl (B) 182 (*)    All other components within normal limits  LACTIC ACID, PLASMA - Abnormal; Notable for the following components:   Lactic Acid, Venous 2.7 (*)    All other components within normal limits  I-STAT CHEM 8, ED - Abnormal; Notable for the following components:   Potassium 3.3 (*)    BUN 5 (*)    Creatinine, Ser 1.30 (*)    Calcium, Ion 1.11 (*)    All other components within normal limits  CBC  PROTIME-INR  URINALYSIS, ROUTINE W REFLEX MICROSCOPIC  SAMPLE TO BLOOD BANK    EKG None  Radiology DG Wrist Complete Left  Result Date: 11/02/2019 CLINICAL DATA:  64 year old male status post fall from porch. EXAM: LEFT WRIST - COMPLETE 3+ VIEW COMPARISON:  None. FINDINGS: Bone mineralization is within normal limits for age. Intact distal radius and ulna. Carpal bone alignment and joint spaces within normal limits. No carpal fracture identified. Metacarpals appear to be intact. No discrete soft tissue injury. IMPRESSION: No acute fracture or dislocation identified about the left wrist. Electronically Signed   By: Odessa FlemingH  Hall M.D.   On: 11/02/2019 19:57   CT HEAD WO CONTRAST  Result Date: 11/02/2019 CLINICAL DATA:  64 year old male status post trauma with pain. History of right ICA occlusion in 2016. EXAM: CT HEAD WITHOUT CONTRAST TECHNIQUE: Contiguous axial images were  obtained from the base of  the skull through the vertex without intravenous contrast. COMPARISON:  Brain MRI and head CT 07/29/2014 and earlier. FINDINGS: Brain: Chronic moderate size right MCA infarct with encephalomalacia and ex vacuo enlargement of the right lateral ventricle. This appears to be the sequelae of 2016 ischemia, although progressed from studies at that time. No superimposed midline shift, ventriculomegaly, mass effect, evidence of mass lesion, intracranial hemorrhage or acute cortically based infarction. There is mild bilateral cerebral white matter hypodensity outside of the chronic ischemia. Posterior fossa mega cisterna magna versus small arachnoid cyst is stable and appears inconsequential. Vascular: Calcified atherosclerosis at the skull base. No suspicious intracranial vascular hyperdensity. Skull: No acute osseous abnormality identified. Sinuses/Orbits: Subtotal opacification of the posterior left ethmoid and left sphenoid sinuses with some associated low-density fluid. This appears inflammatory. Mild mucosal thickening also in the left maxillary sinus. Tympanic cavities and mastoids are well pneumatized. Other: No acute orbit or scalp soft tissue injury identified. IMPRESSION: 1. No acute traumatic injury identified. 2. Chronic right MCA infarct.  No acute intracranial abnormality. 3. Left paranasal sinus inflammation. Electronically Signed   By: Odessa Fleming M.D.   On: 11/02/2019 20:11   CT CERVICAL SPINE WO CONTRAST  Result Date: 11/02/2019 CLINICAL DATA:  64 year old male status post trauma with pain. EXAM: CT CERVICAL SPINE WITHOUT CONTRAST TECHNIQUE: Multidetector CT imaging of the cervical spine was performed without intravenous contrast. Multiplanar CT image reconstructions were also generated. COMPARISON:  Head CT today reported separately. CTA neck 07/26/2014. FINDINGS: Alignment: Improved cervical lordosis since 2016. Cervicothoracic junction alignment is within normal limits.  Bilateral posterior element alignment is within normal limits. Skull base and vertebrae: Visualized skull base is intact. No atlanto-occipital dissociation. No acute osseous abnormality identified. Soft tissues and spinal canal: No prevertebral fluid or swelling. No visible canal hematoma. Calcified bilateral cervical carotid atherosclerosis. Disc levels: Intermittent cervical facet arthropathy. Lower cervical endplate degeneration. No significant cervical spinal stenosis suspected. Upper chest: Visible upper thoracic levels appear intact. There is chronic pulmonary apical paraseptal emphysema and/or scarring. IMPRESSION: 1. No acute traumatic injury identified in the cervical spine. 2. Generally mild for age cervical spine degeneration. Electronically Signed   By: Odessa Fleming M.D.   On: 11/02/2019 20:14   DG Pelvis Portable  Result Date: 11/02/2019 CLINICAL DATA:  64 year old male status post fall from porch. Soft tissue injury. EXAM: PORTABLE PELVIS 1-2 VIEWS COMPARISON:  Pelvis 08/02/2017. FINDINGS: AP view at 1929 hours. Mildly oblique today. Femoral heads remain normally located. Bone mineralization is within normal limits for age. Pelvis appears stable and intact. SI joints and symphysis appear stable. Grossly intact proximal femurs. Calcified iliofemoral artery atherosclerosis. Negative visible lower abdominal and pelvic visceral contours. IMPRESSION: 1. No acute fracture or dislocation identified about the pelvis. 2. Calcified vascular disease. Electronically Signed   By: Odessa Fleming M.D.   On: 11/02/2019 19:52   DG Chest Port 1 View  Result Date: 11/02/2019 CLINICAL DATA:  64 year old male status post fall from front porch. EXAM: PORTABLE CHEST 1 VIEW COMPARISON:  Chest radiographs 03/19/2015. FINDINGS: Portable AP supine view at 1927 hours. Lung volumes and mediastinal contours remain normal. Visualized tracheal air column is within normal limits. Allowing for portable technique the lungs are clear. No  pneumothorax or pleural effusion evident on this supine view. No acute osseous abnormality identified. IMPRESSION: No acute cardiopulmonary abnormality or acute traumatic injury identified. Electronically Signed   By: Odessa Fleming M.D.   On: 11/02/2019 19:50    Procedures Procedures (including critical care  time)  Medications Ordered in ED Medications  Tdap (BOOSTRIX) injection 0.5 mL (0.5 mLs Intramuscular Given 11/02/19 2055)    ED Course  I have reviewed the triage vital signs and the nursing notes.  Pertinent labs & imaging results that were available during my care of the patient were reviewed by me and considered in my medical decision making (see chart for details).    MDM Rules/Calculators/A&P                          Possible differential diagnoses I considered included:  ICH, skull fracture, concussion, C-spine injury, thoracic trauma, abdominal trauma, spinal injury, fracture, neurovascular injury, laceration  Workup/Thought Process:  Patient presents following presumed fall and syncope in the setting of heavy alcohol use and chronic anticoagulation.  Primary survey performed with findings above  Secondary survey performed with findings above  Vitals, labs, EKGs, and imaging were reviewed by myself, and were significant for: Vitals:   11/02/19 2038 11/02/19 2045  BP: (!) 110/57 104/78  Pulse: 73 66  Resp:  19  Temp:    SpO2: 100% 98%    Trauma scans including CT head, CT C-spine, indicated, significant for: No clinically significant findings  Plain films of left wrist significant for: No evident injury  Trauma labs including CBC, CMP, urinalysis, lactic acid, ethanol level indicated. Findings include: Slight left acidosis, likely due to traumatic injury not infection, otherwise CBC and chemistries unremarkable  Ethanol level elevated at 182.  Patient would otherwise be relatively high risk for syncope however in the setting of consumption of a half of 1/5 of  alcohol, clear intoxication on arrival, feel the syncope is likely related to alcohol use and since patient is with reassuring vital signs here and reassuring traumatic work-up, feel that further syncope work-up unnecessary  Tdap administered.  At this time I feel patient is stable for discharge home, patient is now requesting to go home as well.  I spoke with patient's daughter who is his primary caretaker is able to arrange transport and take care of him while intoxicated.  Need for outpatient follow-up stressed as well as strict return precautions and she expressed understanding and agreement.  The plan for this patient was discussed with Dr. Jeraldine Loots who voiced agreement and who oversaw evaluation and treatment of this patient.   Final Clinical Impression(s) / ED Diagnoses Final diagnoses:  Fall  Alcoholic intoxication without complication (HCC)    Rx / DC Orders ED Discharge Orders    None     Labs, studies and imaging reviewed by myself and considered in medical decision making if ordered. Imaging interpreted by radiology. Pt was discussed with my attending, Dr. Jeraldine Loots  Electronically signed by:  Christiane Ha Redding9/17/202112:03 AM       Loree Fee, MD 11/03/19 Ivor Reining    Gerhard Munch, MD 11/08/19 1715

## 2019-11-02 NOTE — ED Notes (Signed)
Date and time results received: 11/02/19 8:40 PM  (use smartphrase ".now" to insert current time)  Test: critical care  Critical Value: 2.7  Name of Provider Notified: [place] MD   Orders Received? Or Actions Taken?:

## 2019-12-27 DIAGNOSIS — Z23 Encounter for immunization: Secondary | ICD-10-CM | POA: Diagnosis not present

## 2019-12-27 DIAGNOSIS — E785 Hyperlipidemia, unspecified: Secondary | ICD-10-CM | POA: Diagnosis not present

## 2019-12-27 DIAGNOSIS — R414 Neurologic neglect syndrome: Secondary | ICD-10-CM | POA: Diagnosis not present

## 2019-12-27 DIAGNOSIS — G8114 Spastic hemiplegia affecting left nondominant side: Secondary | ICD-10-CM | POA: Diagnosis not present

## 2019-12-27 DIAGNOSIS — I10 Essential (primary) hypertension: Secondary | ICD-10-CM | POA: Diagnosis not present

## 2021-05-12 ENCOUNTER — Emergency Department (HOSPITAL_COMMUNITY): Payer: Medicare Other

## 2021-05-12 ENCOUNTER — Inpatient Hospital Stay (HOSPITAL_COMMUNITY): Payer: Medicare Other

## 2021-05-12 ENCOUNTER — Inpatient Hospital Stay (HOSPITAL_COMMUNITY)
Admission: EM | Admit: 2021-05-12 | Discharge: 2021-05-19 | DRG: 208 | Disposition: A | Discharge status: Home / Self Care | Payer: Medicare Other | Attending: Internal Medicine | Admitting: Internal Medicine

## 2021-05-12 ENCOUNTER — Other Ambulatory Visit: Payer: Self-pay

## 2021-05-12 ENCOUNTER — Encounter (HOSPITAL_COMMUNITY): Payer: Self-pay | Admitting: Emergency Medicine

## 2021-05-12 DIAGNOSIS — M109 Gout, unspecified: Secondary | ICD-10-CM | POA: Diagnosis present

## 2021-05-12 DIAGNOSIS — Z20822 Contact with and (suspected) exposure to covid-19: Secondary | ICD-10-CM | POA: Diagnosis present

## 2021-05-12 DIAGNOSIS — J690 Pneumonitis due to inhalation of food and vomit: Secondary | ICD-10-CM | POA: Diagnosis present

## 2021-05-12 DIAGNOSIS — I4892 Unspecified atrial flutter: Secondary | ICD-10-CM | POA: Diagnosis present

## 2021-05-12 DIAGNOSIS — E785 Hyperlipidemia, unspecified: Secondary | ICD-10-CM | POA: Diagnosis present

## 2021-05-12 DIAGNOSIS — I472 Ventricular tachycardia, unspecified: Secondary | ICD-10-CM | POA: Diagnosis not present

## 2021-05-12 DIAGNOSIS — J810 Acute pulmonary edema: Principal | ICD-10-CM | POA: Diagnosis present

## 2021-05-12 DIAGNOSIS — R000 Tachycardia, unspecified: Secondary | ICD-10-CM | POA: Diagnosis not present

## 2021-05-12 DIAGNOSIS — Z79899 Other long term (current) drug therapy: Secondary | ICD-10-CM

## 2021-05-12 DIAGNOSIS — E440 Moderate protein-calorie malnutrition: Secondary | ICD-10-CM | POA: Insufficient documentation

## 2021-05-12 DIAGNOSIS — E876 Hypokalemia: Secondary | ICD-10-CM | POA: Diagnosis present

## 2021-05-12 DIAGNOSIS — I161 Hypertensive emergency: Secondary | ICD-10-CM | POA: Diagnosis present

## 2021-05-12 DIAGNOSIS — I421 Obstructive hypertrophic cardiomyopathy: Secondary | ICD-10-CM

## 2021-05-12 DIAGNOSIS — R0603 Acute respiratory distress: Principal | ICD-10-CM

## 2021-05-12 DIAGNOSIS — I248 Other forms of acute ischemic heart disease: Secondary | ICD-10-CM | POA: Diagnosis present

## 2021-05-12 DIAGNOSIS — I480 Paroxysmal atrial fibrillation: Secondary | ICD-10-CM | POA: Diagnosis present

## 2021-05-12 DIAGNOSIS — T360X5A Adverse effect of penicillins, initial encounter: Secondary | ICD-10-CM | POA: Diagnosis present

## 2021-05-12 DIAGNOSIS — Z806 Family history of leukemia: Secondary | ICD-10-CM

## 2021-05-12 DIAGNOSIS — F17210 Nicotine dependence, cigarettes, uncomplicated: Secondary | ICD-10-CM | POA: Diagnosis present

## 2021-05-12 DIAGNOSIS — J9602 Acute respiratory failure with hypercapnia: Secondary | ICD-10-CM | POA: Diagnosis present

## 2021-05-12 DIAGNOSIS — J9601 Acute respiratory failure with hypoxia: Secondary | ICD-10-CM | POA: Diagnosis present

## 2021-05-12 DIAGNOSIS — R001 Bradycardia, unspecified: Secondary | ICD-10-CM | POA: Diagnosis not present

## 2021-05-12 DIAGNOSIS — M1990 Unspecified osteoarthritis, unspecified site: Secondary | ICD-10-CM | POA: Diagnosis present

## 2021-05-12 DIAGNOSIS — K521 Toxic gastroenteritis and colitis: Secondary | ICD-10-CM | POA: Diagnosis present

## 2021-05-12 DIAGNOSIS — E8721 Acute metabolic acidosis: Secondary | ICD-10-CM | POA: Diagnosis present

## 2021-05-12 DIAGNOSIS — N179 Acute kidney failure, unspecified: Secondary | ICD-10-CM | POA: Diagnosis present

## 2021-05-12 DIAGNOSIS — Z86010 Personal history of colonic polyps: Secondary | ICD-10-CM

## 2021-05-12 DIAGNOSIS — Z6825 Body mass index (BMI) 25.0-25.9, adult: Secondary | ICD-10-CM | POA: Diagnosis not present

## 2021-05-12 DIAGNOSIS — Z7901 Long term (current) use of anticoagulants: Secondary | ICD-10-CM

## 2021-05-12 DIAGNOSIS — Z8673 Personal history of transient ischemic attack (TIA), and cerebral infarction without residual deficits: Secondary | ICD-10-CM

## 2021-05-12 DIAGNOSIS — I2510 Atherosclerotic heart disease of native coronary artery without angina pectoris: Secondary | ICD-10-CM | POA: Diagnosis present

## 2021-05-12 DIAGNOSIS — R778 Other specified abnormalities of plasma proteins: Secondary | ICD-10-CM | POA: Diagnosis not present

## 2021-05-12 DIAGNOSIS — Z8249 Family history of ischemic heart disease and other diseases of the circulatory system: Secondary | ICD-10-CM

## 2021-05-12 DIAGNOSIS — I4891 Unspecified atrial fibrillation: Secondary | ICD-10-CM | POA: Diagnosis not present

## 2021-05-12 LAB — I-STAT CHEM 8, ED
BUN: 18 mg/dL (ref 8–23)
Calcium, Ion: 1.21 mmol/L (ref 1.15–1.40)
Chloride: 104 mmol/L (ref 98–111)
Creatinine, Ser: 1.1 mg/dL (ref 0.61–1.24)
Glucose, Bld: 301 mg/dL — ABNORMAL HIGH (ref 70–99)
HCT: 51 % (ref 39.0–52.0)
Hemoglobin: 17.3 g/dL — ABNORMAL HIGH (ref 13.0–17.0)
Potassium: 3.1 mmol/L — ABNORMAL LOW (ref 3.5–5.1)
Sodium: 139 mmol/L (ref 135–145)
TCO2: 25 mmol/L (ref 22–32)

## 2021-05-12 LAB — I-STAT ARTERIAL BLOOD GAS, ED
Acid-base deficit: 5 mmol/L — ABNORMAL HIGH (ref 0.0–2.0)
Acid-base deficit: 2 mmol/L (ref 0.0–2.0)
Bicarbonate: 25.6 mmol/L (ref 20.0–28.0)
Bicarbonate: 25.2 mmol/L (ref 20.0–28.0)
Calcium, Ion: 1.28 mmol/L (ref 1.15–1.40)
Calcium, Ion: 1.24 mmol/L (ref 1.15–1.40)
HCT: 49 % (ref 39.0–52.0)
HCT: 50 % (ref 39.0–52.0)
Hemoglobin: 16.7 g/dL (ref 13.0–17.0)
Hemoglobin: 17 g/dL (ref 13.0–17.0)
O2 Saturation: 100 %
O2 Saturation: 97 %
Patient temperature: 98.6
Patient temperature: 98.6
Potassium: 3.2 mmol/L — ABNORMAL LOW (ref 3.5–5.1)
Potassium: 3.1 mmol/L — ABNORMAL LOW (ref 3.5–5.1)
Sodium: 135 mmol/L (ref 135–145)
Sodium: 139 mmol/L (ref 135–145)
TCO2: 28 mmol/L (ref 22–32)
TCO2: 27 mmol/L (ref 22–32)
pCO2 arterial: 70 mmHg (ref 32–48)
pCO2 arterial: 52.3 mmHg — ABNORMAL HIGH (ref 32–48)
pH, Arterial: 7.171 — CL (ref 7.35–7.45)
pH, Arterial: 7.29 — ABNORMAL LOW (ref 7.35–7.45)
pO2, Arterial: 336 mmHg — ABNORMAL HIGH (ref 83–108)
pO2, Arterial: 103 mmHg (ref 83–108)

## 2021-05-12 LAB — CBC WITH DIFFERENTIAL/PLATELET
Abs Immature Granulocytes: 0 10*3/uL (ref 0.00–0.07)
Basophils Absolute: 0.3 10*3/uL — ABNORMAL HIGH (ref 0.0–0.1)
Basophils Relative: 2 %
Eosinophils Absolute: 0.3 10*3/uL (ref 0.0–0.5)
Eosinophils Relative: 2 %
HCT: 51.8 % (ref 39.0–52.0)
Hemoglobin: 16.7 g/dL (ref 13.0–17.0)
Lymphocytes Relative: 44 %
Lymphs Abs: 7.6 10*3/uL — ABNORMAL HIGH (ref 0.7–4.0)
MCH: 31.9 pg (ref 26.0–34.0)
MCHC: 32.2 g/dL (ref 30.0–36.0)
MCV: 98.9 fL (ref 80.0–100.0)
Monocytes Absolute: 2.1 10*3/uL — ABNORMAL HIGH (ref 0.1–1.0)
Monocytes Relative: 12 %
Neutro Abs: 6.9 10*3/uL (ref 1.7–7.7)
Neutrophils Relative %: 40 %
Platelets: 312 10*3/uL (ref 150–400)
RBC: 5.24 MIL/uL (ref 4.22–5.81)
RDW: 12.7 % (ref 11.5–15.5)
WBC: 17.2 10*3/uL — ABNORMAL HIGH (ref 4.0–10.5)
nRBC: 0 % (ref 0.0–0.2)
nRBC: 0 /100 WBC

## 2021-05-12 LAB — ECHOCARDIOGRAM COMPLETE
AV Mean grad: 2 mmHg
AV Peak grad: 3.5 mmHg
Ao pk vel: 0.93 m/s
Area-P 1/2: 3.08 cm2
Height: 65 in
MV M vel: 4.55 m/s
MV Peak grad: 82.8 mmHg
Weight: 2479.73 oz

## 2021-05-12 LAB — URINALYSIS, ROUTINE W REFLEX MICROSCOPIC
Bilirubin Urine: NEGATIVE
Glucose, UA: NEGATIVE mg/dL
Ketones, ur: NEGATIVE mg/dL
Leukocytes,Ua: NEGATIVE
Nitrite: NEGATIVE
Protein, ur: 100 mg/dL — AB
Specific Gravity, Urine: 1.012 (ref 1.005–1.030)
pH: 6 (ref 5.0–8.0)

## 2021-05-12 LAB — COMPREHENSIVE METABOLIC PANEL
ALT: 10 U/L (ref 0–44)
AST: 26 U/L (ref 15–41)
Albumin: 3.2 g/dL — ABNORMAL LOW (ref 3.5–5.0)
Alkaline Phosphatase: 78 U/L (ref 38–126)
Anion gap: 17 — ABNORMAL HIGH (ref 5–15)
BUN: 16 mg/dL (ref 8–23)
CO2: 16 mmol/L — ABNORMAL LOW (ref 22–32)
Calcium: 9.1 mg/dL (ref 8.9–10.3)
Chloride: 104 mmol/L (ref 98–111)
Creatinine, Ser: 1.39 mg/dL — ABNORMAL HIGH (ref 0.61–1.24)
GFR, Estimated: 56 mL/min — ABNORMAL LOW (ref >60)
Glucose, Bld: 298 mg/dL — ABNORMAL HIGH (ref 70–99)
Potassium: 2.7 mmol/L — CL (ref 3.5–5.1)
Sodium: 137 mmol/L (ref 135–145)
Total Bilirubin: 0.6 mg/dL (ref 0.3–1.2)
Total Protein: 6.8 g/dL (ref 6.5–8.1)

## 2021-05-12 LAB — TROPONIN I (HIGH SENSITIVITY)
Troponin I (High Sensitivity): 17 ng/L (ref <18)
Troponin I (High Sensitivity): 873 ng/L (ref <18)
Troponin I (High Sensitivity): 861 ng/L (ref <18)
Troponin I (High Sensitivity): 731 ng/L (ref <18)

## 2021-05-12 LAB — LACTIC ACID, PLASMA
Lactic Acid, Venous: 7.7 mmol/L (ref 0.5–1.9)
Lactic Acid, Venous: 3.2 mmol/L (ref 0.5–1.9)

## 2021-05-12 LAB — BASIC METABOLIC PANEL
Anion gap: 7 (ref 5–15)
BUN: 18 mg/dL (ref 8–23)
CO2: 20 mmol/L — ABNORMAL LOW (ref 22–32)
Calcium: 8.8 mg/dL — ABNORMAL LOW (ref 8.9–10.3)
Chloride: 110 mmol/L (ref 98–111)
Creatinine, Ser: 1.3 mg/dL — ABNORMAL HIGH (ref 0.61–1.24)
GFR, Estimated: >60 mL/min (ref >60)
Glucose, Bld: 141 mg/dL — ABNORMAL HIGH (ref 70–99)
Potassium: 5.2 mmol/L — ABNORMAL HIGH (ref 3.5–5.1)
Sodium: 137 mmol/L (ref 135–145)

## 2021-05-12 LAB — I-STAT VENOUS BLOOD GAS, ED
Acid-base deficit: 8 mmol/L — ABNORMAL HIGH (ref 0.0–2.0)
Bicarbonate: 22.6 mmol/L (ref 20.0–28.0)
Calcium, Ion: 1.19 mmol/L (ref 1.15–1.40)
HCT: 49 % (ref 39.0–52.0)
Hemoglobin: 16.7 g/dL (ref 13.0–17.0)
O2 Saturation: 100 %
Potassium: 3.1 mmol/L — ABNORMAL LOW (ref 3.5–5.1)
Sodium: 138 mmol/L (ref 135–145)
TCO2: 25 mmol/L (ref 22–32)
pCO2, Ven: 66.8 mmHg — ABNORMAL HIGH (ref 44–60)
pH, Ven: 7.137 — CL (ref 7.25–7.43)
pO2, Ven: 229 mmHg — ABNORMAL HIGH (ref 32–45)

## 2021-05-12 LAB — PROTIME-INR
INR: 1 (ref 0.8–1.2)
Prothrombin Time: 12.6 seconds (ref 11.4–15.2)

## 2021-05-12 LAB — RESP PANEL BY RT-PCR (FLU A&B, COVID) ARPGX2
Influenza A by PCR: NEGATIVE
Influenza B by PCR: NEGATIVE
SARS Coronavirus 2 by RT PCR: NEGATIVE

## 2021-05-12 LAB — GLUCOSE, CAPILLARY
Glucose-Capillary: 129 mg/dL — ABNORMAL HIGH (ref 70–99)
Glucose-Capillary: 134 mg/dL — ABNORMAL HIGH (ref 70–99)
Glucose-Capillary: 136 mg/dL — ABNORMAL HIGH (ref 70–99)
Glucose-Capillary: 145 mg/dL — ABNORMAL HIGH (ref 70–99)
Glucose-Capillary: 150 mg/dL — ABNORMAL HIGH (ref 70–99)
Glucose-Capillary: 99 mg/dL (ref 70–99)

## 2021-05-12 LAB — APTT
aPTT: 32 seconds (ref 24–36)
aPTT: 52 seconds — ABNORMAL HIGH (ref 24–36)

## 2021-05-12 LAB — BRAIN NATRIURETIC PEPTIDE: B Natriuretic Peptide: 742.2 pg/mL — ABNORMAL HIGH (ref 0.0–100.0)

## 2021-05-12 LAB — CBC
HCT: 49.1 % (ref 39.0–52.0)
Hemoglobin: 16.8 g/dL (ref 13.0–17.0)
MCH: 31.7 pg (ref 26.0–34.0)
MCHC: 34.2 g/dL (ref 30.0–36.0)
MCV: 92.6 fL (ref 80.0–100.0)
Platelets: 333 10*3/uL (ref 150–400)
RBC: 5.3 MIL/uL (ref 4.22–5.81)
RDW: 12.7 % (ref 11.5–15.5)
WBC: 18.1 10*3/uL — ABNORMAL HIGH (ref 4.0–10.5)
nRBC: 0 % (ref 0.0–0.2)

## 2021-05-12 LAB — ETHANOL: Alcohol, Ethyl (B): <10 mg/dL (ref <10)

## 2021-05-12 LAB — PROCALCITONIN: Procalcitonin: 0.73 ng/mL

## 2021-05-12 LAB — MRSA NEXT GEN BY PCR, NASAL: MRSA by PCR Next Gen: NOT DETECTED

## 2021-05-12 LAB — CREATININE, SERUM
Creatinine, Ser: 1.43 mg/dL — ABNORMAL HIGH (ref 0.61–1.24)
GFR, Estimated: 54 mL/min — ABNORMAL LOW (ref >60)

## 2021-05-12 LAB — HIV ANTIBODY (ROUTINE TESTING W REFLEX): HIV Screen 4th Generation wRfx: NONREACTIVE

## 2021-05-12 LAB — MAGNESIUM: Magnesium: 1.9 mg/dL (ref 1.7–2.4)

## 2021-05-12 LAB — HEMOGLOBIN A1C
Hgb A1c MFr Bld: 5.4 % (ref 4.8–5.6)
Mean Plasma Glucose: 108.28 mg/dL

## 2021-05-12 LAB — HEPARIN LEVEL (UNFRACTIONATED): Heparin Unfractionated: 0.41 IU/mL (ref 0.30–0.70)

## 2021-05-12 LAB — PHOSPHORUS: Phosphorus: 3.4 mg/dL (ref 2.5–4.6)

## 2021-05-12 MED ORDER — FENTANYL 2500MCG IN NS 250ML (10MCG/ML) PREMIX INFUSION
25.0000 ug/h | INTRAVENOUS | Status: DC
  Administered 2021-05-12: 25 ug/h via INTRAVENOUS
  Filled 2021-05-12: qty 250

## 2021-05-12 MED ORDER — POLYETHYLENE GLYCOL 3350 17 G PO PACK: 17.0000 g | PACK | Freq: Every day | ORAL | Status: DC | PRN

## 2021-05-12 MED ORDER — PROPOFOL 1000 MG/100ML IV EMUL
0.0000 ug/kg/min | INTRAVENOUS | Status: DC
  Administered 2021-05-12: 5 ug/kg/min via INTRAVENOUS
  Filled 2021-05-12: qty 100

## 2021-05-12 MED ORDER — PERFLUTREN LIPID MICROSPHERE
1.0000 mL | INTRAVENOUS | Status: AC | PRN
  Administered 2021-05-12: 2 mL via INTRAVENOUS
  Filled 2021-05-12: qty 10

## 2021-05-12 MED ORDER — NOREPINEPHRINE 4 MG/250ML-% IV SOLN
2.0000 ug/min | INTRAVENOUS | Status: DC
  Administered 2021-05-12: 2 ug/min via INTRAVENOUS
  Filled 2021-05-12: qty 250

## 2021-05-12 MED ORDER — ONDANSETRON HCL 4 MG/2ML IJ SOLN
4.0000 mg | Freq: Four times a day (QID) | INTRAMUSCULAR | Status: DC | PRN
  Administered 2021-05-15 – 2021-05-17 (×3): 4 mg via INTRAVENOUS
  Filled 2021-05-12 (×3): qty 2

## 2021-05-12 MED ORDER — ORAL CARE MOUTH RINSE
15.0000 mL | OROMUCOSAL | Status: DC
  Administered 2021-05-12 – 2021-05-15 (×24): 15 mL via OROMUCOSAL

## 2021-05-12 MED ORDER — POTASSIUM CHLORIDE 20 MEQ PO PACK
40.0000 meq | PACK | Freq: Once | ORAL | Status: AC
  Administered 2021-05-12: 40 meq
  Filled 2021-05-12: qty 2

## 2021-05-12 MED ORDER — ACETAMINOPHEN 325 MG PO TABS: 650.0000 mg | ORAL_TABLET | Freq: Four times a day (QID) | ORAL | Status: DC | PRN

## 2021-05-12 MED ORDER — ENOXAPARIN SODIUM 40 MG/0.4ML IJ SOSY
40.0000 mg | PREFILLED_SYRINGE | INTRAMUSCULAR | Status: DC
  Administered 2021-05-12: 40 mg via SUBCUTANEOUS
  Filled 2021-05-12: qty 0.4

## 2021-05-12 MED ORDER — MAGNESIUM SULFATE 2 GM/50ML IV SOLN
INTRAVENOUS | Status: AC
  Administered 2021-05-12: 2 g
  Filled 2021-05-12: qty 50

## 2021-05-12 MED ORDER — SODIUM CHLORIDE 0.9 % IV SOLN
250.0000 mL | INTRAVENOUS | Status: DC
  Administered 2021-05-12 – 2021-05-15 (×3): 250 mL via INTRAVENOUS

## 2021-05-12 MED ORDER — SODIUM CHLORIDE 0.9 % IV SOLN
2.0000 g | Freq: Once | INTRAVENOUS | Status: AC
  Administered 2021-05-12: 2 g via INTRAVENOUS
  Filled 2021-05-12: qty 20

## 2021-05-12 MED ORDER — PANTOPRAZOLE SODIUM 40 MG IV SOLR
40.0000 mg | Freq: Every day | INTRAVENOUS | Status: DC
  Administered 2021-05-12 – 2021-05-13 (×2): 40 mg via INTRAVENOUS
  Filled 2021-05-12 (×2): qty 10

## 2021-05-12 MED ORDER — INSULIN ASPART 100 UNIT/ML IJ SOLN
0.0000 [IU] | INTRAMUSCULAR | Status: DC
  Administered 2021-05-12 – 2021-05-13 (×6): 2 [IU] via SUBCUTANEOUS
  Administered 2021-05-13: 3 [IU] via SUBCUTANEOUS
  Administered 2021-05-14 – 2021-05-15 (×4): 2 [IU] via SUBCUTANEOUS

## 2021-05-12 MED ORDER — FUROSEMIDE 10 MG/ML IJ SOLN
40.0000 mg | Freq: Once | INTRAMUSCULAR | Status: AC
  Administered 2021-05-12: 40 mg via INTRAVENOUS
  Filled 2021-05-12: qty 4

## 2021-05-12 MED ORDER — FENTANYL BOLUS VIA INFUSION
25.0000 ug | INTRAVENOUS | Status: DC | PRN
  Filled 2021-05-12: qty 25

## 2021-05-12 MED ORDER — POTASSIUM CHLORIDE 10 MEQ/100ML IV SOLN
10.0000 meq | INTRAVENOUS | Status: AC
  Administered 2021-05-12 (×2): 10 meq via INTRAVENOUS
  Filled 2021-05-12: qty 100

## 2021-05-12 MED ORDER — POTASSIUM CHLORIDE 10 MEQ/100ML IV SOLN
10.0000 meq | INTRAVENOUS | Status: AC
  Administered 2021-05-12 (×2): 10 meq via INTRAVENOUS
  Filled 2021-05-12 (×3): qty 100

## 2021-05-12 MED ORDER — HEPARIN (PORCINE) 25000 UT/250ML-% IV SOLN
1350.0000 [IU]/h | INTRAVENOUS | Status: DC
  Administered 2021-05-12 – 2021-05-13 (×2): 850 [IU]/h via INTRAVENOUS
  Administered 2021-05-15: 1250 [IU]/h via INTRAVENOUS
  Filled 2021-05-12 (×3): qty 250

## 2021-05-12 MED ORDER — FENTANYL CITRATE PF 50 MCG/ML IJ SOSY
50.0000 ug | PREFILLED_SYRINGE | Freq: Once | INTRAMUSCULAR | Status: AC
  Administered 2021-05-12: 50 ug via INTRAVENOUS

## 2021-05-12 MED ORDER — CHLORHEXIDINE GLUCONATE CLOTH 2 % EX PADS
6.0000 | MEDICATED_PAD | Freq: Every day | CUTANEOUS | Status: DC
  Administered 2021-05-12 – 2021-05-16 (×5): 6 via TOPICAL

## 2021-05-12 MED ORDER — KETAMINE HCL 50 MG/5ML IJ SOSY: 70.0000 mg | PREFILLED_SYRINGE | Freq: Once | INTRAMUSCULAR | Status: AC

## 2021-05-12 MED ORDER — FENTANYL BOLUS VIA INFUSION
50.0000 ug | INTRAVENOUS | Status: DC | PRN
  Filled 2021-05-12: qty 100

## 2021-05-12 MED ORDER — CHLORHEXIDINE GLUCONATE 0.12% ORAL RINSE (MEDLINE KIT)
15.0000 mL | Freq: Two times a day (BID) | OROMUCOSAL | Status: DC
  Administered 2021-05-12 – 2021-05-15 (×7): 15 mL via OROMUCOSAL

## 2021-05-12 MED ORDER — LABETALOL HCL 5 MG/ML IV SOLN: 10.0000 mg | INTRAVENOUS | Status: DC | PRN

## 2021-05-12 MED ORDER — DEXMEDETOMIDINE HCL IN NACL 400 MCG/100ML IV SOLN
0.4000 ug/kg/h | INTRAVENOUS | Status: DC
  Administered 2021-05-12 – 2021-05-13 (×2): 0.4 ug/kg/h via INTRAVENOUS
  Filled 2021-05-12: qty 200

## 2021-05-12 MED ORDER — KETAMINE HCL 50 MG/5ML IJ SOSY
PREFILLED_SYRINGE | INTRAMUSCULAR | Status: AC
  Administered 2021-05-12: 70 mg via INTRAVENOUS
  Filled 2021-05-12: qty 5

## 2021-05-12 MED ORDER — LACTATED RINGERS IV BOLUS
500.0000 mL | Freq: Once | INTRAVENOUS | Status: AC
  Administered 2021-05-12: 500 mL via INTRAVENOUS

## 2021-05-12 MED ORDER — DOCUSATE SODIUM 50 MG/5ML PO LIQD: 100.0000 mg | Freq: Two times a day (BID) | ORAL | Status: DC | PRN

## 2021-05-12 MED ORDER — ROCURONIUM BROMIDE 10 MG/ML (PF) SYRINGE
100.0000 mg | PREFILLED_SYRINGE | Freq: Once | INTRAVENOUS | Status: AC
  Administered 2021-05-12: 100 mg via INTRAVENOUS

## 2021-05-12 MED ORDER — SODIUM CHLORIDE 0.9 % IV SOLN
500.0000 mg | Freq: Once | INTRAVENOUS | Status: AC
  Administered 2021-05-12: 500 mg via INTRAVENOUS
  Filled 2021-05-12: qty 5

## 2021-05-12 MED ORDER — VITAL AF 1.2 CAL PO LIQD
1000.0000 mL | ORAL | Status: DC
  Administered 2021-05-12 – 2021-05-15 (×3): 1000 mL

## 2021-05-12 NOTE — ED Notes (Signed)
Warm blankets applied

## 2021-05-12 NOTE — Progress Notes (Signed)
Initial Nutrition Assessment ? ?DOCUMENTATION CODES:  ? ?Non-severe (moderate) malnutrition in context of chronic illness ? ?INTERVENTION:  ? ?Initiate tube feeds via OG tube: ?- Start Vital AF 1.2 @ 45 ml/hr and advance by 10 ml q 6 hours to goal rate of 65 (1560 ml/day) ? ?Tube feeding regimen at goal rate provides 1872 kcal, 117 grams of protein, and 1265 ml of H2O.  ? ?NUTRITION DIAGNOSIS:  ? ?Moderate Malnutrition related to chronic illness (CVA) as evidenced by mild fat depletion, mild muscle depletion. ? ?GOAL:  ? ?Patient will meet greater than or equal to 90% of their needs ? ?MONITOR:  ? ?Vent status, Labs, Weight trends, TF tolerance ? ?REASON FOR ASSESSMENT:  ? ?Ventilator, Consult ?Enteral/tube feeding initiation and management ? ?ASSESSMENT:  ? ?66 year old male who presented to the ED on 3/17 with respiratory distress. Pt required intubation in the ED. PMH of R MCA CVA in 2016, R occluded ICA, atrial fibrillation. ? ?Discussed pt with RN and during ICU rounds. Pt admitted with acute pulmonary edema. ? ?Consult received for tube feeding initiation and management. Pt with OG tube tip and side port below GE junction per x-ray. Consult received for tube feeding initiation and management. ? ?Unable to obtain diet and weight history at this time. Reviewed weight history in chart. Weight appears to have trended up over the last 3.5 years. However, weight on admission appears to be copied over from previous encounter. Recommend obtaining measured admission weight. ? ?Patient is currently intubated on ventilator support ?MV: 13.6 L/min ?Temp (24hrs), Avg:97 ?F (36.1 ?C), Min:96.8 ?F (36 ?C), Max:97.7 ?F (36.5 ?C) ? ?Drips: ?Precedex ?Fentanyl ? ?Medications reviewed and include: SSI q 4 hours, IV protonix, IV KCl 10 mEq x 4 runs ? ?Labs reviewed: potassium 3.1, WBC 17.2 ?CBG's: 129-150 ? ?NUTRITION - FOCUSED PHYSICAL EXAM: ? ?Flowsheet Row Most Recent Value  ?Orbital Region Mild depletion  ?Upper Arm Region  Mild depletion  ?Thoracic and Lumbar Region Mild depletion  ?Buccal Region Unable to assess  ?Temple Region Mild depletion  ?Clavicle Bone Region Mild depletion  ?Clavicle and Acromion Bone Region Moderate depletion  ?Scapular Bone Region Unable to assess  ?Dorsal Hand Mild depletion  ?Patellar Region Mild depletion  ?Anterior Thigh Region Moderate depletion  ?Posterior Calf Region Mild depletion  ?Edema (RD Assessment) None  ?Hair Reviewed  ?Eyes Unable to assess  ?Mouth Unable to assess  ?Skin Reviewed  ?Nails Reviewed  ? ?  ? ? ?Diet Order:   ?Diet Order   ? ?       ?  Diet NPO time specified  Diet effective now       ?  ? ?  ?  ? ?  ? ? ?EDUCATION NEEDS:  ? ?No education needs have been identified at this time ? ?Skin:  Skin Assessment: Reviewed RN Assessment ? ?Last BM:  no documented BM ? ?Height:  ? ?Ht Readings from Last 1 Encounters:  ?05/12/21 5\' 5"  (1.651 m)  ? ? ?Weight:  ? ?Wt Readings from Last 1 Encounters:  ?05/12/21 70.3 kg  ? ? ?BMI:  Body mass index is 25.79 kg/m?. ? ?Estimated Nutritional Needs:  ? ?Kcal:  1800-2000 ? ?Protein:  100-120 grams ? ?Fluid:  1.8-2.0 L ? ? ? ?Gustavus Bryant, MS, RD, LDN ?Inpatient Clinical Dietitian ?Please see AMiON for contact information. ? ?

## 2021-05-12 NOTE — ED Triage Notes (Addendum)
Pt BIB EMS from home with c/o respiratory distress. Fire had O2 30% on RA. EMS tried to place pt on cpap, pt could not tolerate. Pt states he has no respiratory and cardiac hx. Pt on no O2 upon arrival. Pt placed on 15lpm NRB and 10lpm [place], O2 increased 98%.  ?

## 2021-05-12 NOTE — ED Notes (Signed)
Date and time results received: 05/12/21 0612 ?(use smartphrase ".now" to insert current time) ? ?Test: lactic acid ?Critical Value: 7.7 ? ?Name of Provider Notified: floyd md ? ?Orders Received? Or Actions Taken?:  n/a ?

## 2021-05-12 NOTE — Progress Notes (Signed)
ANTICOAGULATION CONSULT NOTE ? ?Pharmacy Consult for heparin ?Indication: chest pain/ACS ? ?Heparin Dosing Weight: 70.3 kg ? ?Labs: ?Recent Labs  ?  05/12/21 ?[id number] 05/12/21 ?[id number] 05/12/21 ?[id number] 05/12/21 ?0701 05/12/21 ?1155  ?HGB 16.7 17.3*  16.7 16.7 17.0 16.8  ?HCT 51.8 51.0  49.0 49.0 50.0 49.1  ?PLT 312  --   --   --  333  ?APTT 32  --   --   --   --   ?LABPROT 12.6  --   --   --   --   ?INR 1.0  --   --   --   --   ?CREATININE 1.39* 1.10  --   --  1.43*  ? ? ?Assessment: ?63 yom with hx of afib presenting with respiratory distress and pulmonary edema, found to have elevated troponin, increasing 17>>861. Pharmacy consulted to dose heparin for ACS. Xarelto listed on patient's PTA med profile, but Epic has no documentation of this having been filled in the last 6 months. INR 1.0, CBC wnl. No bleed issues reported. Patient previously on lovenox 40mg  daily for VTE prophylaxis this admit - last dose at 1148 today. ? ?Goal of Therapy:  ?Heparin level 0.3-0.7 units/ml ?Monitor platelets by anticoagulation protocol: Yes ?  ?Plan:  ?D/c Lovenox prophylaxis ?No heparin bolus with recent Lovenox dose and unclear if patient taking Xarelto PTA ?Start heparin at 850 units/hr ?Check 6hr heparin level/aPTT - d/c aPTT monitoring if correlating with heparin level ?Monitor daily CBC, s/sx bleeding ? ? ?Arturo Morton, PharmD, BCPS ?Clinical Pharmacist ?05/12/2021 3:08 PM ?

## 2021-05-12 NOTE — ED Notes (Signed)
Date and time results received: 05/12/21 0326 ?(use smartphrase ".now" to insert current time) ? ?Test: potassium ?Critical Value: 2.7 ? ?Name of Provider Notified: floyd md ? ?Orders Received? Or Actions Taken?:  n/a ?

## 2021-05-12 NOTE — ED Provider Notes (Signed)
?[place] ?Provider Note ? ? ?CSN: [id number] ?Arrival date & time: 05/12/21  [id number] ? ?  ? ?History ? ?Chief Complaint  ?Patient presents with  ? Respiratory Distress  ? ? ?[name] is a 66 y.o. male. ? ?66 yo M with a chief complaint of shortness of breath.  Per EMS this was sudden onset.  Patient unable to provide much history due to shortness of breath.  Found to be 30% on room air that was after being placed on a nonrebreather.  He was unable to tolerate CPAP and was brought emergently to a room. ? ? ? ?  ? ?Home Medications ?Prior to Admission medications   ?Medication Sig Start Date End Date Taking? Authorizing Provider  ?atorvastatin (LIPITOR) 80 MG tablet TAKE 1 TABLET BY MOUTH  DAILY AT 6 PM 05/12/17   Harrison Mons, PA  ?buPROPion (WELLBUTRIN XL) 150 MG 24 hr tablet Take 1 tablet (150 mg total) by mouth daily. 05/12/17   Harrison Mons, PA  ?colchicine 0.6 MG tablet Take 1 tablet (0.6 mg total) by mouth 2 (two) times daily as needed (gout). ?Patient not taking: Reported on 06/10/2018 05/12/17   Harrison Mons, PA  ?ezetimibe (ZETIA) 10 MG tablet Take 1 tablet (10 mg total) by mouth daily. 09/14/17 12/13/17  Evans Lance, MD  ?ezetimibe (ZETIA) 10 MG tablet  05/24/18   [provider]  ?gabapentin (NEURONTIN) 300 MG capsule Take 1 capsule (300 mg total) by mouth 3 (three) times daily. 05/12/17   Harrison Mons, PA  ?lisinopril (PRINIVIL,ZESTRIL) 5 MG tablet Take 1 tablet (5 mg total) by mouth daily. 05/12/17   Harrison Mons, PA  ?rivaroxaban (XARELTO) 20 MG TABS tablet TAKE 1 TABLET(20 MG) BY MOUTH DAILY WITH DINNER 05/12/17   Harrison Mons, PA  ?sertraline (ZOLOFT) 100 MG tablet Take 1 tablet (100 mg total) by mouth daily. 07/23/17   Harrison Mons, PA  ?traMADol (ULTRAM) 50 MG tablet Take 1 tablet (50 mg total) by mouth 2 (two) times daily as needed. ?Patient not taking: Reported on 06/10/2018 05/12/17   Harrison Mons, [place]  ?   ? ?Allergies    ?Patient  has no known allergies.   ? ?Review of Systems   ?Review of Systems ? ?Physical Exam ?Updated Vital Signs ?BP (!) 164/99   Pulse (!) 101   Temp (!) 97.1 ?F (36.2 ?C)   Resp 18   Ht 5\' 5"  (1.651 m)   Wt 70.3 kg   SpO2 100%   BMI 25.79 kg/m?  ?Physical Exam ?Vitals and nursing note reviewed.  ?Constitutional:   ?   Appearance: He is well-developed. He is ill-appearing.  ?   Comments: Ashen and gray skin.  Agitated.  ?HENT:  ?   Head: Normocephalic and atraumatic.  ?Eyes:  ?   Pupils: Pupils are equal, round, and reactive to light.  ?Neck:  ?   Vascular: No JVD.  ?Cardiovascular:  ?   Rate and Rhythm: Normal rate and regular rhythm.  ?   Heart sounds: No murmur heard. ?  No friction rub. No gallop.  ?Pulmonary:  ?   Effort: No respiratory distress.  ?   Breath sounds: No wheezing.  ?Abdominal:  ?   General: There is no distension.  ?   Tenderness: There is no abdominal tenderness. There is no guarding or rebound.  ?Musculoskeletal:     ?   General: Normal range of motion.  ?   Cervical back: Normal range of  motion and neck supple.  ?Skin: ?   Coloration: Skin is not pale.  ?   Findings: No rash.  ?Neurological:  ?   Mental Status: He is alert and oriented to person, place, and time.  ?Psychiatric:     ?   Behavior: Behavior normal.  ? ? ?ED Results / Procedures / Treatments   ?Labs ?(all labs ordered are listed, but only abnormal results are displayed) ?Labs Reviewed  ?CBC WITH DIFFERENTIAL/PLATELET - Abnormal; Notable for the following components:  ?    Result Value  ? WBC 17.2 (*)   ? Lymphs Abs 7.6 (*)   ? Monocytes Absolute 2.1 (*)   ? Basophils Absolute 0.3 (*)   ? All other components within normal limits  ?I-STAT CHEM 8, ED - Abnormal; Notable for the following components:  ? Potassium 3.1 (*)   ? Glucose, Bld 301 (*)   ? Hemoglobin 17.3 (*)   ? All other components within normal limits  ?I-STAT VENOUS BLOOD GAS, ED - Abnormal; Notable for the following components:  ? pH, Ven 7.137 (*)   ? pCO2, Ven 66.8  (*)   ? pO2, Ven 229 (*)   ? Acid-base deficit 8.0 (*)   ? Potassium 3.1 (*)   ? All other components within normal limits  ?I-STAT ARTERIAL BLOOD GAS, ED - Abnormal; Notable for the following components:  ? pH, Arterial 7.171 (*)   ? pCO2 arterial 70.0 (*)   ? pO2, Arterial 336 (*)   ? Acid-base deficit 5.0 (*)   ? Potassium 3.2 (*)   ? All other components within normal limits  ?CULTURE, BLOOD (ROUTINE X 2)  ?CULTURE, BLOOD (ROUTINE X 2)  ?URINE CULTURE  ?RESP PANEL BY RT-PCR (FLU A&B, COVID) ARPGX2  ?PROTIME-INR  ?APTT  ?ETHANOL  ?LACTIC ACID, PLASMA  ?LACTIC ACID, PLASMA  ?COMPREHENSIVE METABOLIC PANEL  ?URINALYSIS, ROUTINE W REFLEX MICROSCOPIC  ?BRAIN NATRIURETIC PEPTIDE  ?TROPONIN I (HIGH SENSITIVITY)  ? ? ?EKG ?EKG Interpretation ? ?Date/Time:  Monday May 12 2021 05:27:17 EDT ?Ventricular Rate:  127 ?PR Interval:  200 ?QRS Duration: 99 ?QT Interval:  291 ?QTC Calculation: 423 ?R Axis:   88 ?Text Interpretation: Sinus tachycardia Ventricular tachycardia, unsustained Aberrant conduction of SV complex(es) LAE, consider biatrial enlargement Borderline right axis deviation Anteroseptal infarct, old Repol abnrm suggests ischemia, diffuse leads No significant change since last tracing Confirmed by Deno Etienne [phone]) on 05/12/2021 5:29:59 AM ? ?Radiology ?DG Chest Port 1 View ? ?Result Date: 05/12/2021 ?CLINICAL DATA:  Possible sepsis. EXAM: PORTABLE CHEST 1 VIEW COMPARISON:  11/02/2019 FINDINGS: 0532 hours. Endotracheal tube tip is 4.9 cm above the base of the carina. The NG tube passes into the stomach with proximal side port below the GE junction. Diffuse hazy bilateral airspace disease noted without substantial pleural effusion. Cardiopericardial silhouette is at upper limits of normal for size. Telemetry leads overlie the chest. IMPRESSION: 1. Endotracheal tube tip is 4.9 cm above the base of the carina. 2. Diffuse hazy bilateral airspace disease. Imaging features suggest pulmonary edema although diffuse  infection could have this appearance. Electronically Signed   By: Misty Stanley M.D.   On: 05/12/2021 05:49   ? ?Procedures ?Procedure Name: Intubation ?Date/Time: 05/12/2021 5:38 AM ?Performed by: Deno Etienne, DO ?Pre-anesthesia Checklist: Patient identified, Patient being monitored, Emergency Drugs available, Timeout performed and Suction available ?Oxygen Delivery Method: Non-rebreather mask ?Preoxygenation: Pre-oxygenation with 100% oxygen ?Induction Type: Rapid sequence ?Ventilation: Mask ventilation without difficulty ?Laryngoscope Size: Glidescope ?Grade View: Grade  I ?Tube size: 8.0 mm ?Number of attempts: 1 ?Airway Equipment and Method: Video-laryngoscopy ?Placement Confirmation: ETT inserted through vocal cords under direct vision, CO2 detector and Breath sounds checked- equal and bilateral ?Secured at: 22 cm ?Tube secured with: ETT holder ?Dental Injury: Teeth and Oropharynx as per pre-operative assessment  ?Difficulty Due To: Difficulty was anticipated ?Future Recommendations: Recommend- induction with short-acting agent, and alternative techniques readily available ? ? ?  ? ? ?Medications Ordered in ED ?Medications  ?fentaNYL 2541mcg in NS 216mL (25mcg/ml) infusion-PREMIX (50 mcg/hr Intravenous Rate/Dose Change 05/12/21 0559)  ?fentaNYL (SUBLIMAZE) bolus via infusion 25 mcg (has no administration in time range)  ?propofol (DIPRIVAN) 1000 MG/100ML infusion (15 mcg/kg/min ? 70.3 kg Intravenous Rate/Dose Change 05/12/21 0547)  ?cefTRIAXone (ROCEPHIN) 2 g in sodium chloride 0.9 % 100 mL IVPB (2 g Intravenous New Bag/Given 05/12/21 0611)  ?azithromycin (ZITHROMAX) 500 mg in sodium chloride 0.9 % 250 mL IVPB (has no administration in time range)  ?ketamine 50 mg in normal saline 5 mL (10 mg/mL) syringe (70 mg Intravenous Given 05/12/21 0514)  ?rocuronium bromide 10 mg/mL (PF) syringe (100 mg Intravenous Given 05/12/21 0514)  ?fentaNYL (SUBLIMAZE) injection 50 mcg (50 mcg Intravenous Given 05/12/21 0533)  ?furosemide  (LASIX) injection 40 mg (40 mg Intravenous Given 05/12/21 0548)  ?magnesium sulfate 2 GM/50ML IVPB (0 g  Stopped 05/12/21 0537)  ? ? ?ED Course/ Medical Decision Making/ A&P ?  ?                        ?[facility]

## 2021-05-12 NOTE — ED Notes (Signed)
RN entering room pt had pulled ventilator tubing out. RT was walking by and replaced tubing. Primary RN and secondary RN assisted with calming and sedating pt.  ? ?Pt was able to open eyes and follow commands.  ? ?RN reassured pt he was safe and in the hospital. Pt nodded head as yes he understands.  ?

## 2021-05-12 NOTE — ED Notes (Signed)
70mg  ketamine at 0514 ? ?100mg  roc at 0514 ? ?8ett in at 0516 ? ?23 at teeth ?

## 2021-05-12 NOTE — Progress Notes (Signed)
? ?  Echocardiogram ?2D Echocardiogram has been performed. ? ?[name] ?05/12/2021, 11:25 AM ?

## 2021-05-12 NOTE — H&P (Signed)
? ?NAME:  [name], MRN:  [id number], DOB:  1956/02/06, LOS: 0 ?ADMISSION DATE:  05/12/2021, CONSULTATION DATE:  05/12/21 ?REFERRING MD:  EDP, CHIEF COMPLAINT:  SOB  ? ?History of Present Illness:  ?66 yo with pmh R MCA cva in 2016, R occluded ICA, h/o afib  presented in resp distress via ems. All history obtained from edp and chart review as pt is intubated and sedated. Pt was noted to have called ems 2/2 resp distress, sudden onset. Upon arrival emergency found oxygen saturations to be in 30's on RA. Was attempted on cpap but did not tolerate this and arrived to hospital on no supplemental oxygen. He was immediately placed on NRB with increase in sats to 98%.  Pt is tachycardic and hypertensive. Cxr revealed bilateral airspace disease suspicious for acute pulm edema.  ? ?Pertinent  Medical History  ?As above ? ?Significant Hospital Events: ?Including procedures, antibiotic start and stop dates in addition to other pertinent events   ?3/27: intubated and admitted to [facility] ? ?Interim History / Subjective:  ?As above ? ?Objective   ?Blood pressure (!) 178/114, pulse (!) 127, temperature (!) 96.9 ?F (36.1 ?C), resp. rate 20, height 5\' 5"  (1.651 m), weight 70.3 kg, SpO2 100 %. ?   ?Vent Mode: PRVC ?FiO2 (%):  [100 %] 100 % ?Set Rate:  [20 bmp] 20 bmp ?Vt Set:  [500 mL] 500 mL ?PEEP:  [10 cmH20] 10 cmH20 ?Plateau Pressure:  [26 cmH20] 26 cmH20  ?No intake or output data in the 24 hours ending 05/12/21 0606 ?Filed Weights  ? 05/12/21 0521  ?Weight: 70.3 kg  ? ? ?Examination: ?General: sedated and intubated ?HENT: NCAT, EOMI, PERRLA, MMMP ?Lungs: CTA ?Cardiovascular: rrr ?Abdomen: soft nt,nd,bs+ ?Extremities: no c/c/e, poor hygiene ?Neuro: sedated and unresponsive just given paralytic for ett ?GU: deferred ? ?Resolved Hospital Problem list   ? ? ?Assessment & Plan:  ?Acute hypoxic/hypercarbic resp failure:  ?-titrate vent ?-vap bundle ?-diurese as able ?-abx empiric but more likely pulm edema rather than acute  infection ?-can send sputum cx, low threshold to d/c abx.  ?-echo ?Acute pulm edema:  ?-diurese  ?-echo ? ?H/o afib:  ?-noted ?-monitor on tele ? ?HTN emergency:  ?-will attempt prn but suspect his pulm edema is 2/2 this ?-may req gtt for bp control ? ?H/o R MCA cva:  ?-? Deficits. RN reports pt was previously flailing "everything in bed" ?-back in 2016 ? ?Best Practice (right click and "Reselect all SmartList Selections" daily)  ? ?Diet/type: NPO ?DVT prophylaxis: LMWH ?GI prophylaxis: PPI ?Lines: N/A ?Foley:  N/A ?Code Status:  full code ?Last date of multidisciplinary goals of care discussion [pending contact with familyu. ] ? ?Labs   ?CBC: ?Recent Labs  ?Lab 05/12/21 ?[id number] 05/12/21 ?[id number]  ?WBC 17.2*  --   ?NEUTROABS 6.9  --   ?HGB 16.7 17.3*  16.7  ?HCT 51.8 51.0  49.0  ?MCV 98.9  --   ?PLT 312  --   ? ? ?Basic Metabolic Panel: ?Recent Labs  ?Lab 05/12/21 ?[id number]  ?NA 139  138  ?K 3.1*  3.1*  ?CL 104  ?GLUCOSE 301*  ?BUN 18  ?CREATININE 1.10  ? ?GFR: ?Estimated Creatinine Clearance: 58.2 mL/min (by C-G formula based on SCr of 1.1 mg/dL). ?Recent Labs  ?Lab 05/12/21 ?0527  ?WBC 17.2*  ? ? ?Liver Function Tests: ?No results for input(s): AST, ALT, ALKPHOS, BILITOT, PROT, ALBUMIN in the last 168 hours. ?No results for input(s): LIPASE, AMYLASE in the last 168  hours. ?No results for input(s): AMMONIA in the last 168 hours. ? ?ABG ?   ?Component Value Date/Time  ? HCO3 22.6 05/12/2021 0549  ? TCO2 25 05/12/2021 0549  ? TCO2 25 05/12/2021 0549  ? ACIDBASEDEF 8.0 (H) 05/12/2021 0549  ? O2SAT 100 05/12/2021 0549  ?  ? ?Coagulation Profile: ?Recent Labs  ?Lab 05/12/21 ?3094  ?INR 1.0  ? ? ?Cardiac Enzymes: ?No results for input(s): CKTOTAL, CKMB, CKMBINDEX, TROPONINI in the last 168 hours. ? ?HbA1C: ?Hgb A1c MFr Bld  ?Date/Time Value Ref Range Status  ?05/12/2017 11:33 AM 5.6 4.8 - 5.6 % Final  ?  Comment:  ?           Prediabetes: 5.7 - 6.4 ?         Diabetes: >6.4 ?         Glycemic control for adults with diabetes:  <7.0 ?  ?10/30/2014 09:26 AM 6.0 (H) <5.7 % Final  ?  Comment:  ?                                                                         ?According to the ADA Clinical Practice Recommendations for 2011, when ?HbA1c is used as a screening test: ?  ?  >=6.5%   Diagnostic of Diabetes Mellitus ?           (if abnormal result is confirmed) ?  ?5.7-6.4%   Increased risk of developing Diabetes Mellitus ?  ?References:Diagnosis and Classification of Diabetes Mellitus,Diabetes ?MHWK,0881,10(RPRXY 1):S62-S69 and Standards of Medical Care in         ?Diabetes - 2011,Diabetes Care,2011,34 (Suppl 1):S11-S61. ?  ?  ? ? ?CBG: ?No results for input(s): GLUCAP in the last 168 hours. ? ?Review of Systems:   ?Unobtainable 2/2 intubated status ? 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  ? ?Surgical History:  ? ?Past Surgical History:  ?Procedure Laterality Date  ? COLONOSCOPY  2011  ? Dr Loreta Ave.   ? EP IMPLANTABLE DEVICE N/A 07/27/2014  ? Procedure: Loop Recorder Insertion;  Surgeon: Marinus Maw, MD;  Location: [facility] INVASIVE CV LAB;  Service: Cardiovascular;  Laterality: N/A;  ? EP IMPLANTABLE DEVICE N/A 12/20/2014  ? Procedure: Loop Recorder Removal;  Surgeon: Marinus Maw, MD;  Location: [facility] INVASIVE CV LAB;  Service: Cardiovascular;  Laterality: N/A;  ? ESOPHAGOGASTRODUODENOSCOPY N/A 03/21/2015  ? Procedure: ESOPHAGOGASTRODUODENOSCOPY (EGD);  Surgeon: Jeani Hawking, MD;  Location: [facility] ENDOSCOPY;  Service: Endoscopy;  Laterality: N/A;  ? LOOP RECORDER IMPLANT  07/27/14  ? LOOP REVEAL LINQ [id number] - [id number]  ? loop  recorder removed 2017    ? TEE WITHOUT CARDIOVERSION N/A 07/27/2014  ? Procedure: TRANSESOPHAGEAL ECHOCARDIOGRAM (TEE);  Surgeon: Chrystie Nose, MD;  Location: [facility] ENDOSCOPY;  Service: Cardiovascular;  Laterality: N/A;  ?  ? ?Social History:  ? reports that he has been smoking cigarettes. He has a 20.00 pack-year smoking history. He  has never used smokeless tobacco. He reports that he does not currently use alcohol. He reports current drug use. Drug: Marijuana.  ? ?Family History:  ?His family history includes Alcohol abuse in his brother; Cancer in his father; Heart attack in his mother; Heart attack (age of onset: 49) in his brother; Heart disease in his brother; Hypertension in his brother.  ? ?Allergies ?No Known Allergies  ? ?Home Medications  ?Prior to Admission medications   ?Medication Sig Start Date End Date Taking? Authorizing Provider  ?atorvastatin (LIPITOR) 80 MG tablet TAKE 1 TABLET BY MOUTH  DAILY AT 6 PM 05/12/17   Harrison Mons, PA  ?buPROPion (WELLBUTRIN XL) 150 MG 24 hr tablet Take 1 tablet (150 mg total) by mouth daily. 05/12/17   Harrison Mons, PA  ?colchicine 0.6 MG tablet Take 1 tablet (0.6 mg total) by mouth 2 (two) times daily as needed (gout). ?Patient not taking: Reported on 06/10/2018 05/12/17   Harrison Mons, PA  ?ezetimibe (ZETIA) 10 MG tablet Take 1 tablet (10 mg total) by mouth daily. 09/14/17 12/13/17  Evans Lance, MD  ?ezetimibe (ZETIA) 10 MG tablet  05/24/18   [provider]  ?gabapentin (NEURONTIN) 300 MG capsule Take 1 capsule (300 mg total) by mouth 3 (three) times daily. 05/12/17   Harrison Mons, PA  ?lisinopril (PRINIVIL,ZESTRIL) 5 MG tablet Take 1 tablet (5 mg total) by mouth daily. 05/12/17   Harrison Mons, PA  ?rivaroxaban (XARELTO) 20 MG TABS tablet TAKE 1 TABLET(20 MG) BY MOUTH DAILY WITH DINNER 05/12/17   Harrison Mons, PA  ?sertraline (ZOLOFT) 100 MG tablet Take 1 tablet (100 mg total) by mouth daily. 07/23/17   Harrison Mons, PA  ?traMADol (ULTRAM) 50  MG tablet Take 1 tablet (50 mg total) by mouth 2 (two) times daily as needed. ?Patient not taking: Reported on 06/10/2018 05/12/17   Harrison Mons, [place]  ?  ? ?Critical care time: 16 ?  ? ? ? ?  ?

## 2021-05-12 NOTE — Progress Notes (Signed)
Patient transported to 3M12 from ED without complications. RN at bedside. °

## 2021-05-13 ENCOUNTER — Inpatient Hospital Stay (HOSPITAL_COMMUNITY): Payer: Medicare Other

## 2021-05-13 ENCOUNTER — Other Ambulatory Visit (HOSPITAL_COMMUNITY): Payer: Self-pay

## 2021-05-13 DIAGNOSIS — J9602 Acute respiratory failure with hypercapnia: Secondary | ICD-10-CM

## 2021-05-13 DIAGNOSIS — E440 Moderate protein-calorie malnutrition: Secondary | ICD-10-CM

## 2021-05-13 DIAGNOSIS — J810 Acute pulmonary edema: Secondary | ICD-10-CM

## 2021-05-13 DIAGNOSIS — J9601 Acute respiratory failure with hypoxia: Secondary | ICD-10-CM

## 2021-05-13 DIAGNOSIS — R778 Other specified abnormalities of plasma proteins: Secondary | ICD-10-CM

## 2021-05-13 DIAGNOSIS — I472 Ventricular tachycardia, unspecified: Secondary | ICD-10-CM

## 2021-05-13 LAB — STREP PNEUMONIAE URINARY ANTIGEN: Strep Pneumo Urinary Antigen: NEGATIVE

## 2021-05-13 LAB — CBC
HCT: 41.4 % (ref 39.0–52.0)
Hemoglobin: 14 g/dL (ref 13.0–17.0)
MCH: 31.3 pg (ref 26.0–34.0)
MCHC: 33.8 g/dL (ref 30.0–36.0)
MCV: 92.4 fL (ref 80.0–100.0)
Platelets: 245 10*3/uL (ref 150–400)
RBC: 4.48 MIL/uL (ref 4.22–5.81)
RDW: 13 % (ref 11.5–15.5)
WBC: 11.6 10*3/uL — ABNORMAL HIGH (ref 4.0–10.5)
nRBC: 0 % (ref 0.0–0.2)

## 2021-05-13 LAB — BASIC METABOLIC PANEL
Anion gap: 9 (ref 5–15)
Anion gap: 11 (ref 5–15)
BUN: 22 mg/dL (ref 8–23)
BUN: 24 mg/dL — ABNORMAL HIGH (ref 8–23)
CO2: 21 mmol/L — ABNORMAL LOW (ref 22–32)
CO2: 19 mmol/L — ABNORMAL LOW (ref 22–32)
Calcium: 8.7 mg/dL — ABNORMAL LOW (ref 8.9–10.3)
Calcium: 8.8 mg/dL — ABNORMAL LOW (ref 8.9–10.3)
Chloride: 109 mmol/L (ref 98–111)
Chloride: 107 mmol/L (ref 98–111)
Creatinine, Ser: 1.24 mg/dL (ref 0.61–1.24)
Creatinine, Ser: 1.21 mg/dL (ref 0.61–1.24)
GFR, Estimated: >60 mL/min (ref >60)
GFR, Estimated: >60 mL/min (ref >60)
Glucose, Bld: 147 mg/dL — ABNORMAL HIGH (ref 70–99)
Glucose, Bld: 201 mg/dL — ABNORMAL HIGH (ref 70–99)
Potassium: 3.7 mmol/L (ref 3.5–5.1)
Potassium: 3.6 mmol/L (ref 3.5–5.1)
Sodium: 139 mmol/L (ref 135–145)
Sodium: 137 mmol/L (ref 135–145)

## 2021-05-13 LAB — GLUCOSE, CAPILLARY
Glucose-Capillary: 103 mg/dL — ABNORMAL HIGH (ref 70–99)
Glucose-Capillary: 126 mg/dL — ABNORMAL HIGH (ref 70–99)
Glucose-Capillary: 126 mg/dL — ABNORMAL HIGH (ref 70–99)
Glucose-Capillary: 148 mg/dL — ABNORMAL HIGH (ref 70–99)
Glucose-Capillary: 149 mg/dL — ABNORMAL HIGH (ref 70–99)
Glucose-Capillary: 200 mg/dL — ABNORMAL HIGH (ref 70–99)

## 2021-05-13 LAB — MAGNESIUM
Magnesium: 1.8 mg/dL (ref 1.7–2.4)
Magnesium: 2 mg/dL (ref 1.7–2.4)

## 2021-05-13 LAB — TRIGLYCERIDES: Triglycerides: 128 mg/dL (ref <150)

## 2021-05-13 LAB — URINE CULTURE: Culture: NO GROWTH

## 2021-05-13 LAB — POCT I-STAT 7, (LYTES, BLD GAS, ICA,H+H)
Acid-base deficit: 5 mmol/L — ABNORMAL HIGH (ref 0.0–2.0)
Bicarbonate: 24 mmol/L (ref 20.0–28.0)
Calcium, Ion: 1.28 mmol/L (ref 1.15–1.40)
HCT: 49 % (ref 39.0–52.0)
Hemoglobin: 16.7 g/dL (ref 13.0–17.0)
O2 Saturation: 96 %
Patient temperature: 98.1
Potassium: 3.9 mmol/L (ref 3.5–5.1)
Sodium: 137 mmol/L (ref 135–145)
TCO2: 26 mmol/L (ref 22–32)
pCO2 arterial: 56.4 mmHg — ABNORMAL HIGH (ref 32–48)
pH, Arterial: 7.235 — ABNORMAL LOW (ref 7.35–7.45)
pO2, Arterial: 96 mmHg (ref 83–108)

## 2021-05-13 LAB — PHOSPHORUS
Phosphorus: 2 mg/dL — ABNORMAL LOW (ref 2.5–4.6)
Phosphorus: 5.3 mg/dL — ABNORMAL HIGH (ref 2.5–4.6)

## 2021-05-13 LAB — TROPONIN I (HIGH SENSITIVITY)
Troponin I (High Sensitivity): 254 ng/L (ref <18)
Troponin I (High Sensitivity): 991 ng/L (ref <18)

## 2021-05-13 LAB — HEPARIN LEVEL (UNFRACTIONATED): Heparin Unfractionated: 0.35 IU/mL (ref 0.30–0.70)

## 2021-05-13 LAB — PROCALCITONIN: Procalcitonin: 0.67 ng/mL

## 2021-05-13 MED ORDER — ETOMIDATE 2 MG/ML IV SOLN
20.0000 mg | Freq: Once | INTRAVENOUS | Status: AC
  Administered 2021-05-13: 20 mg via INTRAVENOUS

## 2021-05-13 MED ORDER — DEXMEDETOMIDINE HCL IN NACL 400 MCG/100ML IV SOLN: 0.4000 ug/kg/h | INTRAVENOUS | Status: DC

## 2021-05-13 MED ORDER — AMIODARONE HCL IN DEXTROSE 360-4.14 MG/200ML-% IV SOLN
30.0000 mg/h | INTRAVENOUS | Status: DC
  Administered 2021-05-13 – 2021-05-16 (×6): 30 mg/h via INTRAVENOUS
  Filled 2021-05-13 (×6): qty 200

## 2021-05-13 MED ORDER — MAGNESIUM SULFATE 2 GM/50ML IV SOLN
2.0000 g | Freq: Once | INTRAVENOUS | Status: AC
  Administered 2021-05-13: 2 g via INTRAVENOUS

## 2021-05-13 MED ORDER — ROCURONIUM BROMIDE 10 MG/ML (PF) SYRINGE
PREFILLED_SYRINGE | INTRAVENOUS | Status: AC
  Administered 2021-05-13: 100 mg
  Filled 2021-05-13: qty 10

## 2021-05-13 MED ORDER — AMIODARONE LOAD VIA INFUSION
150.0000 mg | Freq: Once | INTRAVENOUS | Status: AC
  Administered 2021-05-13: 150 mg via INTRAVENOUS
  Filled 2021-05-13: qty 83.34

## 2021-05-13 MED ORDER — ETOMIDATE 2 MG/ML IV SOLN
INTRAVENOUS | Status: AC
  Filled 2021-05-13: qty 20

## 2021-05-13 MED ORDER — MIDAZOLAM HCL 2 MG/2ML IJ SOLN
INTRAMUSCULAR | Status: AC
  Administered 2021-05-13: 2 mg
  Filled 2021-05-13: qty 2

## 2021-05-13 MED ORDER — MIDAZOLAM HCL 2 MG/2ML IJ SOLN
INTRAMUSCULAR | Status: AC
  Filled 2021-05-13: qty 2

## 2021-05-13 MED ORDER — DOCUSATE SODIUM 50 MG/5ML PO LIQD
100.0000 mg | Freq: Two times a day (BID) | ORAL | Status: DC
  Administered 2021-05-13 – 2021-05-15 (×4): 100 mg
  Filled 2021-05-13 (×4): qty 10

## 2021-05-13 MED ORDER — SODIUM CHLORIDE 0.9 % IV SOLN
3.0000 g | Freq: Four times a day (QID) | INTRAVENOUS | Status: AC
  Administered 2021-05-13 – 2021-05-18 (×20): 3 g via INTRAVENOUS
  Filled 2021-05-13 (×19): qty 8

## 2021-05-13 MED ORDER — DEXMEDETOMIDINE HCL IN NACL 400 MCG/100ML IV SOLN
INTRAVENOUS | Status: AC
  Filled 2021-05-13: qty 100

## 2021-05-13 MED ORDER — NITROGLYCERIN IN D5W 200-5 MCG/ML-% IV SOLN
0.0000 ug/min | INTRAVENOUS | Status: DC
  Administered 2021-05-13: 5 ug/min via INTRAVENOUS
  Filled 2021-05-13: qty 250

## 2021-05-13 MED ORDER — ROCURONIUM BROMIDE 10 MG/ML (PF) SYRINGE: 100.0000 mg | PREFILLED_SYRINGE | Freq: Once | INTRAVENOUS | Status: DC

## 2021-05-13 MED ORDER — FENTANYL CITRATE (PF) 100 MCG/2ML IJ SOLN: 50.0000 ug | INTRAMUSCULAR | Status: DC | PRN

## 2021-05-13 MED ORDER — MIDAZOLAM HCL 2 MG/2ML IJ SOLN: 2.0000 mg | Freq: Once | INTRAMUSCULAR | Status: DC

## 2021-05-13 MED ORDER — MAGNESIUM SULFATE 2 GM/50ML IV SOLN
2.0000 g | Freq: Once | INTRAVENOUS | Status: AC
  Filled 2021-05-13: qty 50

## 2021-05-13 MED ORDER — SODIUM BICARBONATE 8.4 % IV SOLN: 100.0000 meq | Freq: Once | INTRAVENOUS | Status: AC

## 2021-05-13 MED ORDER — MIDAZOLAM HCL 2 MG/2ML IJ SOLN
2.0000 mg | Freq: Once | INTRAMUSCULAR | Status: AC
  Administered 2021-05-13: 2 mg via INTRAVENOUS

## 2021-05-13 MED ORDER — FENTANYL BOLUS VIA INFUSION
25.0000 ug | INTRAVENOUS | Status: DC | PRN
  Administered 2021-05-14: 100 ug via INTRAVENOUS
  Filled 2021-05-13: qty 100

## 2021-05-13 MED ORDER — SODIUM BICARBONATE 8.4 % IV SOLN
INTRAVENOUS | Status: AC
  Administered 2021-05-13: 100 meq via INTRAVENOUS
  Filled 2021-05-13: qty 100

## 2021-05-13 MED ORDER — NOREPINEPHRINE 4 MG/250ML-% IV SOLN
2.0000 ug/min | INTRAVENOUS | Status: DC
  Administered 2021-05-13: 10 ug/min via INTRAVENOUS
  Administered 2021-05-13: 2 ug/min via INTRAVENOUS
  Administered 2021-05-13: 15 ug/min via INTRAVENOUS
  Administered 2021-05-14: 2 ug/min via INTRAVENOUS
  Filled 2021-05-13 (×3): qty 250

## 2021-05-13 MED ORDER — FUROSEMIDE 10 MG/ML IJ SOLN
40.0000 mg | Freq: Once | INTRAMUSCULAR | Status: AC
Start: 2021-05-13 — End: 2021-05-13
  Administered 2021-05-13: 40 mg via INTRAVENOUS
  Filled 2021-05-13: qty 4

## 2021-05-13 MED ORDER — AMIODARONE IV BOLUS ONLY 150 MG/100ML
150.0000 mg | Freq: Once | INTRAVENOUS | Status: AC
  Administered 2021-05-13: 150 mg via INTRAVENOUS

## 2021-05-13 MED ORDER — POTASSIUM PHOSPHATES 15 MMOLE/5ML IV SOLN
15.0000 mmol | Freq: Once | INTRAVENOUS | Status: DC
  Filled 2021-05-13: qty 5

## 2021-05-13 MED ORDER — POLYETHYLENE GLYCOL 3350 17 G PO PACK
17.0000 g | PACK | Freq: Every day | ORAL | Status: DC
  Administered 2021-05-14 – 2021-05-15 (×2): 17 g
  Filled 2021-05-13 (×2): qty 1

## 2021-05-13 MED ORDER — FENTANYL CITRATE (PF) 100 MCG/2ML IJ SOLN
50.0000 ug | INTRAMUSCULAR | Status: DC | PRN
  Administered 2021-05-13: 50 ug via INTRAVENOUS
  Filled 2021-05-13: qty 2

## 2021-05-13 MED ORDER — POTASSIUM CHLORIDE 10 MEQ/100ML IV SOLN
10.0000 meq | INTRAVENOUS | Status: AC
  Administered 2021-05-13: 10 meq via INTRAVENOUS
  Filled 2021-05-13: qty 100

## 2021-05-13 MED ORDER — AMIODARONE HCL IN DEXTROSE 360-4.14 MG/200ML-% IV SOLN
60.0000 mg/h | INTRAVENOUS | Status: DC
  Administered 2021-05-13: 60 mg/h via INTRAVENOUS
  Filled 2021-05-13: qty 200

## 2021-05-13 MED ORDER — FENTANYL CITRATE (PF) 100 MCG/2ML IJ SOLN
INTRAMUSCULAR | Status: AC
  Filled 2021-05-13: qty 2

## 2021-05-13 MED ORDER — AMIODARONE HCL IN DEXTROSE 360-4.14 MG/200ML-% IV SOLN
INTRAVENOUS | Status: AC
  Filled 2021-05-13: qty 200

## 2021-05-13 MED ORDER — FENTANYL CITRATE (PF) 100 MCG/2ML IJ SOLN: 25.0000 ug | Freq: Once | INTRAMUSCULAR | Status: DC

## 2021-05-13 MED ORDER — FENTANYL 2500MCG IN NS 250ML (10MCG/ML) PREMIX INFUSION
25.0000 ug/h | INTRAVENOUS | Status: DC
  Administered 2021-05-13: 25 ug/h via INTRAVENOUS
  Administered 2021-05-14 (×2): 200 ug/h via INTRAVENOUS
  Filled 2021-05-13 (×4): qty 250

## 2021-05-13 MED ORDER — DEXMEDETOMIDINE HCL IN NACL 400 MCG/100ML IV SOLN
0.0000 ug/kg/h | INTRAVENOUS | Status: DC
  Administered 2021-05-13: 0.6 ug/kg/h via INTRAVENOUS

## 2021-05-13 MED ORDER — KETAMINE HCL 50 MG/5ML IJ SOSY
PREFILLED_SYRINGE | INTRAMUSCULAR | Status: AC
  Filled 2021-05-13: qty 5

## 2021-05-13 MED ORDER — SODIUM CHLORIDE 0.9 % IV SOLN: 250.0000 mL | INTRAVENOUS | Status: DC

## 2021-05-13 NOTE — Procedures (Signed)
Arterial Catheter Insertion Procedure Note ? ?[name]  ?[id number]  ?02/29/1955 ? ?Date:05/13/21  ?Time:6:53 PM  ? ? ?Provider Performing: Jacky Kindle  ? ? ?Procedure: Insertion of Arterial Line [phone]) with US guidance [id number])  ? ?Indication(s) ?Blood pressure monitoring and/or need for frequent ABGs ? ?Consent ?Risks of the procedure as well as the alternatives and risks of each were explained to the patient and/or caregiver.  Consent for the procedure was obtained and is signed in the bedside chart ? ?Anesthesia ?None ? ? ?Time Out ?Verified patient identification, verified procedure, site/side was marked, verified correct patient position, special equipment/implants available, medications/allergies/relevant history reviewed, required imaging and test results available. ? ? ?Sterile Technique ?Maximal sterile technique including full sterile barrier drape, hand hygiene, sterile gown, sterile gloves, mask, hair covering, sterile ultrasound probe cover (if used). ? ? ?Procedure Description ?Area of catheter insertion was cleaned with chlorhexidine and draped in sterile fashion. With real-time ultrasound guidance an arterial catheter was placed into the left  Axillary  artery.  Appropriate arterial tracings confirmed on monitor.   ? ? ?Complications/Tolerance ?None; patient tolerated the procedure well. ? ? ?EBL ?Minimal ? ? ?Specimen(s) ?None ? ?

## 2021-05-13 NOTE — Procedures (Signed)
Extubation Procedure Note ? ?Patient Details:   ?Name: [name] ?DOB: 1955-11-02 ?MRN: [id number] ?  ?Airway Documentation:  ?  ?Vent end date: 05/13/21 Vent end time: 0905  ? ?Evaluation ? O2 sats: stable throughout ?Complications: No apparent complications ?Patient did tolerate procedure well. ?Bilateral Breath Sounds: Clear, Diminished ?  ?Yes ? ?RT extubated patient to 3L [place] per MD order with RN at bedside. Positive cuff leak noted. Patient tolerated well. No stridor noted at this time. RT will continue to monitor. ? ?Fabiola Backer ?05/13/2021, 9:07 AM ? ?

## 2021-05-13 NOTE — Procedures (Signed)
Central Venous Catheter Insertion Procedure Note ? ?[name]  ?[id number]  ?1955/09/12 ? ?Date:05/13/21  ?Time:6:53 PM  ? ?Provider Performing:Kimblery Diop  ? ?Procedure: Insertion of Non-tunneled Central Venous Catheter(36556) with US guidance ([id number])  ? ?Indication(s) ?Medication administration ? ?Consent ?Risks of the procedure as well as the alternatives and risks of each were explained to the patient and/or caregiver.  Consent for the procedure was obtained and is signed in the bedside chart ? ?Anesthesia ?Topical only with 1% lidocaine  ? ?Timeout ?Verified patient identification, verified procedure, site/side was marked, verified correct patient position, special equipment/implants available, medications/allergies/relevant history reviewed, required imaging and test results available. ? ?Sterile Technique ?Maximal sterile technique including full sterile barrier drape, hand hygiene, sterile gown, sterile gloves, mask, hair covering, sterile ultrasound probe cover (if used). ? ?Procedure Description ?Area of catheter insertion was cleaned with chlorhexidine and draped in sterile fashion.  With real-time ultrasound guidance a central venous catheter was placed into the left subclavian vein. Nonpulsatile blood flow and easy flushing noted in all ports.  The catheter was sutured in place and sterile dressing applied. ? ?Complications/Tolerance ?None; patient tolerated the procedure well. ?Chest X-ray is ordered to verify placement for internal jugular or subclavian cannulation.   Chest x-ray is not ordered for femoral cannulation. ? ?EBL ?Minimal ? ?Specimen(s) ?None ? ?

## 2021-05-13 NOTE — Progress Notes (Signed)
Pharmacy Electrolyte Replacement ? ?Recent Labs: ? ?Recent Labs  ?  05/13/21 ?0736  ?K 3.7  ?MG 1.8  ?PHOS 2.0*  ?CREATININE 1.24  ? ? ?Low Critical Values (K </= 2.5, Phos </= 1, Mg </= 1) Present: ?None ? ?MD Contacted: n/a - no critical values noted ? ?Plan: KPhos IV x 1 ?Mag sulfate 2g IV x 1 ?KCl IV x 2 doses ?Recheck levels with AM labs ? ? ?Leia Alf, PharmD, BCPS ?Please check AMION for all [facility] contact numbers ?Clinical Pharmacist ?05/13/2021 11:21 AM ? ?

## 2021-05-13 NOTE — Consult Note (Addendum)
? ?The patient has been seen in conjunction with Vin Bhagat, PAC. All aspects of care have been considered and discussed. The patient has been personally interviewed, examined, and all clinical data has been reviewed. ? ?Narrow complex tachycardia with development of WCT treated with urgent cardioversion using 150 Joules x 1 resulting in sinus tachycardia. ?Was admitted with respiratory distress one day ago. Now intubated and sedated. ?Post conversion ECG shows sinus tach without acute ST abnormality. ?Plan to cycle cardiac markers and ECG's. ?With upward trending troponin I, will need ischemic evaluation when better from pulmonary standpoint. ?Potassium repletion and maintain > 3.8 while on IV/oral amio. ?Will follow with you. ?If recurrent sustained WCT please get an ECG or save rhythm strips. ? ? ? ?Cardiology Consultation:  ? ?Patient ID: [name] ?MRN: [id number]; DOB: Sep 05, 1955 ? ?Admit date: 05/12/2021 ?Date of Consult: 05/13/2021 ? ?PCP:  Harrison Mons, PA ?  ?[facility] Providers ?Cardiologist:  None  ?Electrophysiologist:  Cristopher Peru, MD  { ? ?Patient Profile:  ? ?[name] is a 66 y.o. male with a hx of paroxysmal atrial fibrillation, remote cryptogenic stroke, hyperlipidemia, hypertension, tobacco smoking who is being seen 05/13/2021 for the evaluation of ventricular tachycardia at the request of Dr. Tacy Learn. ? ?Patient with history of cryptogenic stroke.  Placed on implantable loop recorder with finding of atrial fibrillation.  Placed on Xarelto for anticoagulation.  Last seen by Dr. Lovena Le in 2019. ? ?History of Present Illness:  ? ?Mr. Sedita brought by EMS yesterday with acute respiratory distress.  Did not tolerated CPAP and placed on 15 L nonrebreather.  Intubated in the emergency room.  Admitted for acute hypoxic respiratory failure in setting of flash pulmonary edema due to hypertensive urgency.  Treated with broad-spectrum antibiotic.  Placed on heparin for elevated troponin  [id number].  Patient extubated this morning.  He was doing well and had sudden onset of ventricular tachycardia this afternoon around 1417.  No reported prodromal symptoms.  No arrest.  Given 1 shock with conversion.  Placed on IV amiodarone with bolus.  No pause.  He aspirated leading to reintubation.  Cardiology is asked for further evaluation with suspected primary cardiac event. ? ?During my evaluation patient had recurrent tachycardia with heart rate of 160s.  EKG read as sinus tachycardia but looks like atrial flutter.  Rebolus with amiodarone with improved rate to 90s. ? ?Echo 05/12/2021 ? 1. Extremely limited; essentially no parasternal views and limited views  ?otherwise; LV function appears to be normal using definity; suggest  ?cardiac MRI to better assess.  ? 2. Left ventricular ejection fraction, by estimation, is 60 to 65%. The  ?left ventricle has normal function. Left ventricular endocardial border  ?not optimally defined to evaluate regional wall motion. Left ventricular  ?diastolic function could not be  ?evaluated.  ? 3. Right ventricular systolic function is normal. The right ventricular  ?size is normal.  ? 4. The mitral valve was not well visualized. No evidence of mitral valve  ?regurgitation. No evidence of mitral stenosis.  ? 5. The aortic valve was not well visualized. Aortic valve regurgitation  ?Not well visualized.  ? 6. Pulmonic valve regurgitation Not well visualized.  ? 7. The inferior vena cava is normal in size with greater than 50%  ?respiratory variability, suggesting right atrial pressure of 3 mmHg.  ? ?Comparison(s): No prior Echocardiogram.  ? ?Past Medical History:  ?Diagnosis Date  ? AKI (acute kidney injury) ([place]) 03/20/2015  ? Alcohol use with alcohol-induced mood disorder ([place])   ?  quit almost all ETOH in 07/2014, previously drank 18 to 20 beers daily.   ? Alterations of sensations following CVA (cerebrovascular accident) 09/10/2014  ? Arthritis   ? Colon polyps 2011  ?  Hyperplastic and 1 tubular adenoma.  Dr Collene Mares  ? 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 a. identified on TEE at time of stroke  ? Embolic stroke involving right middle cerebral artery ([place]) 10/29/2014  ? Gout   ? Hyperlipidemia   ? Hypertension   ? Paroxysmal atrial fibrillation ([facility])   ? a. identified on LINQ 08/2014  ? Stroke [facility]) 07/25/14  ? a. s/p MDT ILR implant.  on xarelto  ? ? ?Past Surgical History:  ?Procedure Laterality Date  ? COLONOSCOPY  2011  ? Dr Collene Mares.   ? EP IMPLANTABLE DEVICE N/A 07/27/2014  ? Procedure: Loop Recorder Insertion;  Surgeon: Evans Lance, MD;  Location: [place] [facility];  Service: Cardiovascular;  Laterality: N/A;  ? EP IMPLANTABLE DEVICE N/A 12/20/2014  ? Procedure: Loop Recorder Removal;  Surgeon: Evans Lance, MD;  Location: [place] [facility];  Service: Cardiovascular;  Laterality: N/A;  ? ESOPHAGOGASTRODUODENOSCOPY N/A 03/21/2015  ? Procedure: ESOPHAGOGASTRODUODENOSCOPY (EGD);  Surgeon: Carol Ada, MD;  Location: [facility];  Service: Endoscopy;  Laterality: N/A;  ? LOOP RECORDER IMPLANT  07/27/14  ? LOOP REVEAL LINQ [id number] - [id number]  ? loop recorder removed 2017    ? TEE WITHOUT CARDIOVERSION N/A 07/27/2014  ? Procedure: TRANSESOPHAGEAL ECHOCARDIOGRAM (TEE);  Surgeon: Pixie Casino, MD;  Location: [place];  Service: Cardiovascular;  Laterality: N/A;  ?  ? ?Inpatient Medications: ?Scheduled Meds: ? chlorhexidine gluconate (MEDLINE KIT)  15 mL Mouth Rinse BID  ? Chlorhexidine Gluconate Cloth  6 each Topical Daily  ? docusate  100 mg Per Tube BID  ? etomidate      ? fentaNYL      ? furosemide  40 mg Intravenous Once  ? insulin aspart  0-15 Units Subcutaneous Q4H  ? ketamine HCl      ? mouth rinse  15 mL Mouth Rinse 10 times per day  ? midazolam      ? midazolam      ? midazolam  2 mg Intravenous Once  ? pantoprazole (PROTONIX) IV  40 mg Intravenous QHS  ? polyethylene glycol  17 g Per Tube Daily  ? rocuronium bromide  100 mg Intravenous Once  ? ?Continuous Infusions: ? sodium chloride  10 mL/hr at 05/13/21 0700  ? amiodarone 60 mg/hr (05/13/21 1437)  ? Followed by  ? amiodarone    ? amiodarone    ? ampicillin-sulbactam (UNASYN) IV    ? dexmedetomidine (PRECEDEX) IV infusion 0.6 mcg/kg/hr (05/13/21 1437)  ? feeding supplement (VITAL AF 1.2 CAL) Stopped (05/13/21 1001)  ? heparin 850 Units/hr (05/13/21 0700)  ? magnesium sulfate bolus IVPB 2 g (05/13/21 1445)  ? nitroGLYCERIN    ? potassium PHOSPHATE IVPB (in mmol)    ? ?PRN Meds: ?acetaminophen, docusate, fentaNYL (SUBLIMAZE) injection, fentaNYL (SUBLIMAZE) injection, labetalol, ondansetron (ZOFRAN) IV, polyethylene glycol ? ?Allergies:   No Known Allergies ? ?Social History:   ?Social History  ? ?Socioeconomic History  ? Marital status: Widowed  ?  Spouse name: Marcie Bal  ? Number of children: 3  ? Years of education: 15  ? Highest education level: Not on file  ?Occupational History  ? Occupation: former Aeronautical engineer  ?  Comment: disabled due to CVA  ?Tobacco Use  ? Smoking status: Every Day  ?  Packs/day: 0.50  ?  Years: 40.00  ?  Pack years: 20.00  ?  Types: Cigarettes  ? Smokeless tobacco: Never  ? Tobacco comments:  ?  cut back after stroke, but increased again due to depression  ?Vaping Use  ? Vaping Use: Never used  ?Substance and Sexual Activity  ? Alcohol use: Not Currently  ? Drug use: Yes  ?  Types: Marijuana  ?  Comment: trying to help his appetite  ? Sexual activity: Yes  ?  Partners: Female  ?Other Topics Concern  ? Not on file  ?Social History Narrative  ? Lives at home with wife, Marcie Bal  ? Caffeine use - tea 4-5 glasses a day  ? ?Social Determinants of Health  ? ?Financial Resource Strain: Not on file  ?Food Insecurity: Not on file  ?Transportation Needs: Not on file  ?Physical Activity: Not on file  ?Stress: Not on file  ?Social Connections: Not on file  ?Intimate Partner Violence: Not on file  ?  ?Family History:   ?Family History  ?Problem Relation Age of Onset  ? Cancer Father   ?     leukemia  ? Heart attack Mother   ? Heart disease  Brother   ? Heart attack Brother 71  ?     CABG, stenting  ? Hypertension Brother   ? Alcohol abuse Brother   ?  ? ?ROS:  ?Please see the history of present illness.  ? ?All other ROS reviewed and negative.    ? ?Ph

## 2021-05-13 NOTE — Evaluation (Signed)
Occupational Therapy Evaluation ?Patient Details ?Name: [name] ?MRN: [id number] ?DOB: [birth year] ?Today's Date: 05/13/2021 ? ? ?History of Present Illness Pt adm 3/27 with acute hypoxic respiratory failure and acute pulmonary edema. Intubated in ED and extubated 3/28.  PMH - afib, htn, rt CVA  ? ?Clinical Impression ?  ?PTA, pt lives with family, typically Independent with ADLs, mobility and driving. Pt presents now with deficits in cardiopulmonary tolerance (new supplemental O2 needs), safety awareness and endurance. Pt restless and impulsive (requests nicotine patch) but appears fairly close to physical baseline. Overall, pt requires Supervision for LB ADLs and min guard for dynamic mobility without AD. Educated on energy conservation strategies and breathing techniques. Anticipate no OT needs at DC but will continue to follow acutely.  ? ?HR briefly up to 160 and 145bpm, inconsistent with activity but sustained ?HR low 100s at rest. ?SpO2 98% on 4 LO2 ?   ? ?Recommendations for follow up therapy are one component of a multi-disciplinary discharge planning process, led by the attending physician.  Recommendations may be updated based on patient status, additional functional criteria and insurance authorization.  ? ?Follow Up Recommendations ? No OT follow up  ?  ?Assistance Recommended at Discharge Intermittent Supervision/Assistance  ?Patient can return home with the following Assistance with cooking/housework;Assist for transportation;Help with stairs or ramp for entrance ? ?  ?Functional Status Assessment ? Patient has had a recent decline in their functional status and demonstrates the ability to make significant improvements in function in a reasonable and predictable amount of time.  ?Equipment Recommendations ? None recommended by OT  ?  ?Recommendations for Other Services   ? ? ?  ?Precautions / Restrictions Precautions ?Precautions: Fall;Other (comment) ?Precaution Comments: monitor O2 (does not wear at  baseline); HR ?Restrictions ?Weight Bearing Restrictions: No  ? ?  ? ?Mobility Bed Mobility ?  ?  ?  ?  ?  ?  ?  ?General bed mobility comments: up in chair ?  ? ?Transfers ?Overall transfer level: Needs assistance ?Equipment used: None ?Transfers: Sit to/from Stand ?Sit to Stand: Supervision ?  ?  ?Step pivot transfers: Supervision ?  ?  ?General transfer comment: supervision for safety and line management ?  ? ?  ?Balance Overall balance assessment: Mild deficits observed, not formally tested ?  ?  ?  ?  ?  ?  ?  ?  ?  ?  ?  ?  ?  ?  ?  ?  ?  ?  ?   ? ?ADL either performed or assessed with clinical judgement  ? ?ADL Overall ADL's : Needs assistance/impaired ?Eating/Feeding: Independent ?  ?Grooming: Supervision/safety;Standing ?  ?Upper Body Bathing: Independent ?  ?Lower Body Bathing: Supervison/ safety;Sit to/from stand ?  ?Upper Body Dressing : Independent ?  ?Lower Body Dressing: Supervision/safety ?Lower Body Dressing Details (indicate cue type and reason): able to easily reach feet, standing without UE support ?Toilet Transfer: Min guard;Stand-pivot ?  ?Toileting- Clothing Manipulation and Hygiene: Supervision/safety;Sit to/from stand ?  ?  ?  ?Functional mobility during ADLs: Min guard ?General ADL Comments: restless and impulsive but overall close to baseline though new O2 requirements noted.  ? ? ? ?Vision Ability to See in Adequate Light: 0 Adequate ?Patient Visual Report: No change from baseline ?Vision Assessment?: No apparent visual deficits  ?   ?Perception   ?  ?Praxis   ?  ? ?Pertinent Vitals/Pain Pain Assessment ?Pain Assessment: No/denies pain  ? ? ? ?Hand Dominance Right ?  ?  Extremity/Trunk Assessment Upper Extremity Assessment ?Upper Extremity Assessment: LUE deficits/detail ?LUE Deficits / Details: hx of CVA with coordination deficits (ataxia with finger to nose test) and sensation impairments (says he has to look at what he is holding to avoid dropping it) ?LUE Sensation: decreased light  touch ?LUE Coordination: decreased fine motor;decreased gross motor ?  ?Lower Extremity Assessment ?Lower Extremity Assessment: Defer to PT evaluation ?  ?Cervical / Trunk Assessment ?Cervical / Trunk Assessment: Normal ?  ?Communication Communication ?Communication: No difficulties ?  ?Cognition Arousal/Alertness: Awake/alert ?Behavior During Therapy: Restless, Impulsive ?Overall Cognitive Status: No family/caregiver present to determine baseline cognitive functioning ?Area of Impairment: Safety/judgement, Attention ?  ?  ?  ?  ?  ?  ?  ?  ?  ?Current Attention Level: Selective ?  ?  ?Safety/Judgement: Decreased awareness of safety ?  ?  ?General Comments: restless, difficulty sitting still. pleasant, follows all directions ?  ?  ?General Comments  SpO2 98% on 4 L O2. HR briefly up to 160bpm, 145bpm with light activities, sustained >7 seconds but returned to low 100s afterwards. pt denied symptoms but may have felt his forehead was "sweaty" ? ?  ?Exercises   ?  ?Shoulder Instructions    ? ? ?Home Living Family/patient expects to be discharged to:: Private residence ?Living Arrangements: Children;Other relatives (daughter and her family) ?Available Help at Discharge: Family;Available PRN/intermittently (most of the time) ?Type of Home: House ?Home Access: Stairs to enter ?Entrance Stairs-Number of Steps: 2-3 ?Entrance Stairs-Rails: Right ?Home Layout: One level ?  ?  ?  ?  ?  ?  ?  ?Home Equipment: Conservation officer, nature (2 wheels) ?  ?  ?  ? ?  ?Prior Functioning/Environment Prior Level of Function : Independent/Modified Independent;Driving ?  ?  ?  ?  ?  ?  ?Mobility Comments: doesn't use assistive device ?ADLs Comments: Independent with ADLs, driving; does not do IADLs. endorses one recent fall off the porch that involved EtoH ?  ? ?  ?  ?OT Problem List: Impaired balance (sitting and/or standing);Decreased cognition;Cardiopulmonary status limiting activity ?  ?   ?OT Treatment/Interventions: Self-care/ADL  training;Therapeutic exercise;DME and/or AE instruction;Therapeutic activities;Energy conservation;Patient/family education  ?  ?OT Goals(Current goals can be found in the care plan section) Acute Rehab OT Goals ?Patient Stated Goal: go home, stop smoking ?OT Goal Formulation: With patient ?Time For Goal Achievement: 05/27/21 ?Potential to Achieve Goals: Good  ?OT Frequency: Min 2X/week ?  ? ?Co-evaluation   ?  ?  ?  ?  ? ?  ?AM-PAC OT "6 Clicks" Daily Activity     ?Outcome Measure Help from another person eating meals?: None ?Help from another person taking care of personal grooming?: A Little ?Help from another person toileting, which includes using toliet, bedpan, or urinal?: A Little ?Help from another person bathing (including washing, rinsing, drying)?: A Little ?Help from another person to put on and taking off regular upper body clothing?: None ?Help from another person to put on and taking off regular lower body clothing?: A Little ?6 Click Score: 20 ?  ?End of Session Equipment Utilized During Treatment: Oxygen ?Nurse Communication: Mobility status;Other (comment) (HR) ? ?Activity Tolerance: Patient tolerated treatment well ?Patient left: in chair;with call bell/phone within reach;with chair alarm set ? ?OT Visit Diagnosis: Unsteadiness on feet (R26.81)  ?              ?Time: [id number] ?OT Time Calculation (min): 23 min ?Charges:  OT General Charges ?$OT Visit: 1  Visit ?OT Evaluation ?$OT Eval Moderate Complexity: 1 Mod ? ?Almyra Free B, OTR/L ?Acute Rehab Services ?Office: [phone]  ? ?Layla Maw ?05/13/2021, 1:39 PM ?

## 2021-05-13 NOTE — Progress Notes (Signed)
ANTICOAGULATION CONSULT NOTE ?Pharmacy Consult for heparin ?Indication: chest pain/ACS ?Brief A/P: Heparin level within goal range Continue Heparin at current rate  ? ?Heparin Dosing Weight: 70.3 kg ? ?Labs: ?Recent Labs  ?  05/12/21 ?9798 05/12/21 ?9211 05/12/21 ?9417 05/12/21 ?0701 05/12/21 ?1155 05/12/21 ?1400 05/12/21 ?2224  ?HGB 16.7 17.3*  16.7 16.7 17.0 16.8  --   --   ?HCT 51.8 51.0  49.0 49.0 50.0 49.1  --   --   ?PLT 312  --   --   --  333  --   --   ?APTT 32  --   --   --   --   --  52*  ?LABPROT 12.6  --   --   --   --   --   --   ?INR 1.0  --   --   --   --   --   --   ?HEPARINUNFRC  --   --   --   --   --   --  0.41  ?CREATININE 1.39* 1.10  --   --  1.43* 1.30*  --   ? ? ? ?Assessment: ?66 y.o. male with ACS for heparin ? ?Goal of Therapy:  ?Heparin level 0.3-0.7 units/ml ?Monitor platelets by anticoagulation protocol: Yes ?  ?Plan:  ?Continue Heparin at current rate  ? ?Geannie Risen, PharmD, BCPS  ?05/13/2021 12:33 AM ?

## 2021-05-13 NOTE — TOC Benefit Eligibility Note (Addendum)
Patient Advocate Encounter ? ?Insurance verification completed.   ? ?The patient is currently admitted and upon discharge could be taking Eliquis 5 mg. ? ?The current 30 day co-pay is, $47.00.  ? ?The patient is currently admitted and upon discharge could be taking Xarelto 20 mg. ? ?The current 30 day co-pay is, $47.00.  ? ?The patient is insured through [employer] Medicare Part D  ? ? ? ?Rajiv Parlato Ostrovsky, CPhT ?Pharmacy Patient Advocate Specialist ?[facility] Patient Advocate Team ?Direct Number: [phone]  Fax: [phone] ? ? ? ? ? ?  ?

## 2021-05-13 NOTE — Evaluation (Signed)
Physical Therapy Evaluation ?Patient Details ?Name: [name] ?MRN: [id number] ?DOB: 10-26-1955 ?Today's Date: 05/13/2021 ? ?History of Present Illness ? Pt adm 3/27 with acute hypoxic respiratory failure and acute pulmonary edema. Intubated in ED and extubated 3/28.  PMH - afib, htn, rt CVA  ?Clinical Impression ? Pt presents to PT with slightly unsteady gait due to illness and inactivity. Expect pt will make good progress back to baseline with mobility. Will follow acutely but doubt pt will need PT after DC.  ?   ?   ? ?Recommendations for follow up therapy are one component of a multi-disciplinary discharge planning process, led by the attending physician.  Recommendations may be updated based on patient status, additional functional criteria and insurance authorization. ? ?Follow Up Recommendations No PT follow up ? ?  ?Assistance Recommended at Discharge Intermittent Supervision/Assistance  ?Patient can return home with the following ?   ? ?  ?Equipment Recommendations None recommended by PT  ?Recommendations for Other Services ?    ?  ?Functional Status Assessment Patient has had a recent decline in their functional status and demonstrates the ability to make significant improvements in function in a reasonable and predictable amount of time.  ? ?  ?Precautions / Restrictions Precautions ?Precautions: Fall  ? ?  ? ?Mobility ? Bed Mobility ?Overal bed mobility: Needs Assistance ?Bed Mobility: Supine to Sit ?  ?  ?Supine to sit: Supervision ?  ?  ?General bed mobility comments: Assist for line management and safety ?  ? ?Transfers ?Overall transfer level: Needs assistance ?Equipment used: None ?Transfers: Sit to/from Stand, Bed to chair/wheelchair/BSC ?Sit to Stand: Supervision ?  ?Step pivot transfers: Supervision ?  ?  ?  ?General transfer comment: supervision for safety and line management ?  ? ?Ambulation/Gait ?Ambulation/Gait assistance: Min guard ?Gait Distance (Feet): 150 Feet ?Assistive device: IV  Pole ?Gait Pattern/deviations: Step-through pattern, Decreased stride length ?Gait velocity: decr ?Gait velocity interpretation: 1.31 - 2.62 ft/sec, indicative of limited community ambulator ?  ?General Gait Details: Assist for safety and lines. No overt loss of balance ? ?Stairs ?  ?  ?  ?  ?  ? ?Wheelchair Mobility ?  ? ?Modified Rankin (Stroke Patients Only) ?  ? ?  ? ?Balance Overall balance assessment: Mild deficits observed, not formally tested ?  ?  ?  ?  ?  ?  ?  ?  ?  ?  ?  ?  ?  ?  ?  ?  ?  ?  ?   ? ? ? ?Pertinent Vitals/Pain Pain Assessment ?Pain Assessment: No/denies pain  ? ? ?Home Living Family/patient expects to be discharged to:: Private residence ?Living Arrangements: Children;Other relatives (daughter and her family) ?Available Help at Discharge: Family;Available PRN/intermittently (most of the time) ?Type of Home: House ?Home Access: Stairs to enter ?Entrance Stairs-Rails: Right ?Entrance Stairs-Number of Steps: 2-3 ?  ?Home Layout: One level ?Home Equipment: Conservation officer, nature (2 wheels) ?   ?  ?Prior Function Prior Level of Function : Independent/Modified Independent;Driving ?  ?  ?  ?  ?  ?  ?Mobility Comments: doesn't use assistive device ?  ?  ? ? ?Hand Dominance  ? Dominant Hand: Right ? ?  ?Extremity/Trunk Assessment  ? Upper Extremity Assessment ?Upper Extremity Assessment: Defer to OT evaluation ?  ? ?Lower Extremity Assessment ?Lower Extremity Assessment: Generalized weakness ?  ? ?   ?Communication  ? Communication: No difficulties  ?Cognition Arousal/Alertness: Awake/alert ?Behavior During Therapy: Restless, Impulsive ?Overall  Cognitive Status: No family/caregiver present to determine baseline cognitive functioning ?Area of Impairment: Safety/judgement ?  ?  ?  ?  ?  ?  ?  ?  ?  ?  ?  ?  ?Safety/Judgement: Decreased awareness of safety ?  ?  ?General Comments: likely baseline ?  ?  ? ?  ?General Comments General comments (skin integrity, edema, etc.): VSS on 5-6L O2 ? ?  ?Exercises     ? ?Assessment/Plan  ?  ?PT Assessment Patient needs continued PT services  ?PT Problem List Decreased mobility;Decreased strength;Decreased safety awareness ? ?   ?  ?PT Treatment Interventions DME instruction;Gait training;Stair training;Functional mobility training;Therapeutic activities;Therapeutic exercise;Patient/family education   ? ?PT Goals (Current goals can be found in the Care Plan section)  ?Acute Rehab PT Goals ?Patient Stated Goal: go home ?PT Goal Formulation: With patient ?Time For Goal Achievement: 05/20/21 ?Potential to Achieve Goals: Good ? ?  ?Frequency Min 3X/week ?  ? ? ?Co-evaluation   ?  ?  ?  ?  ? ? ?  ?AM-PAC PT "6 Clicks" Mobility  ?Outcome Measure Help needed turning from your back to your side while in a flat bed without using bedrails?: None ?Help needed moving from lying on your back to sitting on the side of a flat bed without using bedrails?: None ?Help needed moving to and from a bed to a chair (including a wheelchair)?: A Little ?Help needed standing up from a chair using your arms (e.g., wheelchair or bedside chair)?: A Little ?Help needed to walk in hospital room?: A Little ?Help needed climbing 3-5 steps with a railing? : A Little ?6 Click Score: 20 ? ?  ?End of Session Equipment Utilized During Treatment: Oxygen ?Activity Tolerance: Patient tolerated treatment well ?Patient left: in chair;with call bell/phone within reach;with chair alarm set ?Nurse Communication: Mobility status;Other (comment) (pt request for nicotine patch) ?PT Visit Diagnosis: Other abnormalities of gait and mobility (R26.89);Muscle weakness (generalized) (M62.81) ?  ? ?Time: [id number] ?PT Time Calculation (min) (ACUTE ONLY): 33 min ? ? ?Charges:   PT Evaluation ?$PT Eval Moderate Complexity: 1 Mod ?PT Treatments ?$Gait Training: 8-22 mins ?  ?   ? ? ?[facility] PT ?Acute Rehabilitation Services ?Pager [phone] ?Office [phone] ? ? ?Shary Decamp [facility] ?05/13/2021, 1:09 PM ? ?

## 2021-05-13 NOTE — TOC Benefit Eligibility Note (Deleted)
Patient Advocate Encounter ?  ?Received notification fthat prior authorization for Xarelto 20 mg is required. ?  ?PA submitted on 05/13/2021 ?Key [id number] ?Status is pending ?   ? ? ? ?Lyndel Safe, CPhT ?Pharmacy Patient Advocate Specialist ?[place] Patient Advocate Team ?Direct Number: [phone]  Fax: [phone]  ?

## 2021-05-13 NOTE — Procedures (Signed)
? ?   DIRECT CURRENT CARDIOVERSION ? ?NAME:  [name]   MRN: [id number] ?DOB:  1955/03/03   ADMIT DATE: 05/12/2021 ? ? ?INDICATIONS: Ventricular tachycardia ? ? ?PROCEDURE:  ? ?Informed consent was not obtained due to emergent nature of the procedure  ?Once an appropriate time out was taken, the patient had the defibrillator pads placed in the anterior and posterior position. The patient then underwent sedation with Versed 4 mg. Once an appropriate level of sedation was achieved, the patient received a single biphasic, synchronized 120J shock with prompt conversion to sinus rhythm. No apparent complications. ? ?   ?

## 2021-05-13 NOTE — Progress Notes (Signed)
I was called to come to bedside as patient went into sustained ventricular tachycardia, heart rate was ranging 180-200/min, he became agitated and restless trying to come out of bed and but with altered mental status.  Patient was given 2 mg of Versed without much improvement, he received 2 more milligram of Versed with that he calmed down, he remained in V. tach, he received IV Amio bolus, followed by amiodarone infusion but he remained in sustained V. tach.  Cardioversion was performed with 1.2 J with conversion to sinus rhythm, please see separate note.,  Postprocedure patient was noted to be hypoxic, emergent endotracheal intubation was performed, during the airway inspection he was noted to have large amount of secretions around vocal cords, suctioning was performed and he was successfully intubated.  Please see separate note for endotracheal intubation. ? ? ?Patient was given 40 mg of IV Lasix x1 ?Cardiology was consulted ?He will be started on IV Unasyn for probable aspiration pneumonia with acute hypoxic respiratory failure ?Continue full mechanical ventilatory support, continue IV amiodarone infusion ? ? ?Additional critical care time: 32 minutes ? ?Performed by: Cheri Fowler ?  ?Critical care time was exclusive of separately billable procedures and treating other patients. ?  ?Critical care was necessary to treat or prevent imminent or life-threatening deterioration. ?  ?Critical care was time spent personally by me on the following activities: development of treatment plan with patient and/or surrogate as well as nursing, discussions with consultants, evaluation of patient's response to treatment, examination of patient, obtaining history from patient or surrogate, ordering and performing treatments and interventions, ordering and review of laboratory studies, ordering and review of radiographic studies, pulse oximetry and re-evaluation of patient's condition. ?  ?Cheri Fowler MD ?[facility] ?See Amion for pager ?If no response to pager, please call [phone] until 7pm ?After 7pm, Please call E-link [phone] ? ?

## 2021-05-13 NOTE — Progress Notes (Signed)
? ?NAME:  [name], MRN:  [id number], DOB:  17-Nov-1955, LOS: 1 ?ADMISSION DATE:  05/12/2021, CONSULTATION DATE:  05/12/21 ?REFERRING MD:  EDP, CHIEF COMPLAINT:  SOB  ? ?History of Present Illness:  ?66 yo with pmh R MCA cva in 2016, R occluded ICA, h/o afib  presented in resp distress via ems. All history obtained from edp and chart review as pt is intubated and sedated. Pt was noted to have called ems 2/2 resp distress, sudden onset. Upon arrival emergency found oxygen saturations to be in 30's on RA. Was attempted on cpap but did not tolerate this and arrived to hospital on no supplemental oxygen. He was immediately placed on NRB with increase in sats to 98%.  Pt is tachycardic and hypertensive. Cxr revealed bilateral airspace disease suspicious for acute pulm edema.  ? ?Pertinent  Medical History  ?As above ? ?Significant Hospital Events: ?Including procedures, antibiotic start and stop dates in addition to other pertinent events   ?3/27: intubated and admitted to [facility] ?3/28 Seen alert and interactive on vent ? ?Interim History / Subjective:  ?Alert and interactive on vent, asking for water  ? ?Objective   ?Blood pressure (!) 88/60, pulse (!) 52, temperature 98.8 ?F (37.1 ?C), temperature source Bladder, resp. rate (!) 28, height 5\' 5"  (1.651 m), weight 75.2 kg, SpO2 97 %. ?   ?Vent Mode: PRVC ?FiO2 (%):  [50 %-100 %] 50 % ?Set Rate:  [28 bmp] 28 bmp ?Vt Set:  [500 mL] 500 mL ?PEEP:  [5 cmH20-12 cmH20] 5 cmH20 ?Plateau Pressure:  [18 cmH20-27 cmH20] 18 cmH20  ? ?Intake/Output Summary (Last 24 hours) at 05/13/2021 0715 ?Last data filed at 05/13/2021 0600 ?Gross per 24 hour  ?Intake 1397.72 ml  ?Output 1295 ml  ?Net 102.72 ml  ? ?Filed Weights  ? 05/12/21 0521 05/13/21 0344  ?Weight: 70.3 kg 75.2 kg  ? ? ?Examination: ?General: Acute on chronically ill appearing middle aged male lying in bed on mechanical ventilation, in NAD ?HEENT: [place]/AT, MM pink/moist, PERRL,  ?Neuro: Alert and oriented x3, non-focal ?CV: s1s2  regular rate and rhythm, no murmur, rubs, or gallops,  ?PULM:  Clear to ascultation, no increased work of breathing, tolerating vent  ?GI: soft, bowel sounds active in all 4 quadrants, non-tender, non-distended, tolerating TF ?Extremities: warm/dry, no edema  ?Skin: no rashes or lesions ? ?Resolved Hospital Problem list   ?HTN emergency ? ?Assessment & Plan:  ?Acute hypoxic/hypercarbic resp failure  ?-Secondary to flash pulmonary edema in the setting of hypertensive emergency  ?-ECHO 60-65% with no WMA ?P: ?Tolerating SBT well, plan for extubation today  ?Wean PEEP and FiO2 for sats greater than 90%. ?Head of bed elevated 30 degrees. ?Follow intermittent chest x-ray and ABG.   ?Ensure adequate pulmonary hygiene  ?Mobilize as able  ? ?Elevated troponin  ?-Peak at 873, likely secondary to demand ischemia  ?H/o afib  ?-Anticoagulated at baseline with   ?Hx of HTN/HLD ?-Home medications includes; Lipitor, Zetia, Lisinopril,  ?P: ?Continue heparin drip  ?Resume home antihypertensives when appropriate  ?Continuous tele ?Monitor volume status  ? ?H/o R MCA cva ?-? Deficits. RN reports pt was previously flailing "everything in bed" ?P: ?Maintain neuro protective measures ?Nutrition and bowel regiment  ?Aspirations precautions  ?PT/OT  ? ?Mild AKI  ?-Creatinine 3/27 1.30 with baseline 0.95-1.1 ?P: ?Follow renal function  ?Monitor urine output ?Trend Bmet ?Avoid nephrotoxins ?Ensure adequate renal perfusion  ? ?Best Practice (right click and "Reselect all SmartList Selections" daily)  ? ?  Diet/type: NPO ?DVT prophylaxis: LMWH ?GI prophylaxis: PPI ?Lines: N/A ?Foley:  N/A ?Code Status:  full code ?Last date of multidisciplinary goals of care discussion: Pending contact with family ? ?Critical care time: 38  ?Navarre Diana D. Harris, NP-C ?[facility] ?Personal contact information can be found on Amion  ?05/13/2021, 8:11 AM ? ? ? ? ?  ?

## 2021-05-13 NOTE — Progress Notes (Signed)
Critical care attending attestation note: ?I agree with the Advanced Practitioner's note, impression, and recommendations as outlined. I have taken an independent interval history, reviewed the chart and examined the patient. The following reflects my medical decision making and independent critical care time ? ? ?Synopsis of assessment and plan: ?66 year old male with prior right MCA stroke and paroxysmal A-fib here with acute hypoxic/hypercapnic respiratory failure in the setting of flash pulmonary edema caused by hypertensive emergency ? ?Patient is tolerating splinting breathing trial no overnight issues ?  ?Physical exam: ?General: Critically ill-appearing male, orally intubated ?HEENT: [place]/AT, eyes anicteric.  ETT and OGT in place ?Neuro: Awake, following commands, moving all 4 extremities ?Chest: Coarse breath sounds, no wheezes or rhonchi ?Heart: Regular rate and rhythm, no murmurs or gallops ?Abdomen: Soft, nontender, nondistended, bowel sounds present ?Skin: No rash ? ?Labs and images were reviewed ? ?Assessment and plan: ?Acute hypoxic/hypercapnic respiratory failure ?Flash pulmonary edema in the setting of hypertensive emergency ?Acute coronary syndrome versus demand cardiac ischemia ?Proximal A-fib on anticoagulation with Xarelto ?Hypokalemia, corrected ?Acute kidney injury ? ?Patient is tolerating splinting breathing trial, watch for respiratory distress ?Try to extubate him ?Blood pressure is well controlled, after diuretic therapy pulmonary edema has resolved ?Echocardiogram showed normal EF with no wall motion abnormalities ?Patient remained in sinus rhythm ?Currently on IV heparin infusion, his troponin elevations are likely due to demand cardiac ischemia from hypertensive emergency ?Closely monitor serum ? ?CRITICAL CARE ?Performed by: Cheri Fowler ? ? ?Total independent critical care time: 35 minutes ? ?Critical care time was exclusive of separately billable procedures and treating other  patients. ? ?Critical care was necessary to treat or prevent imminent or life-threatening deterioration. ? ? ?Critical care was time spent personally by me on the following activities: development of treatment plan with patient and/or surrogate as well as nursing, discussions with consultants, evaluation of patient's response to treatment, examination of patient, obtaining history from patient or surrogate, ordering and performing treatments and interventions, ordering and review of laboratory studies, ordering and review of radiographic studies, pulse oximetry, re-evaluation of patient's condition and participation in multidisciplinary rounds. ? ?Cheri Fowler MD ?[facility] ?See Amion for pager ?If no response to pager, please call [phone] until 7pm ?After 7pm, Please call E-link [phone] ? ?05/13/2021, 8:13 AM ? ?

## 2021-05-13 NOTE — Progress Notes (Signed)
Pharmacy Antibiotic Note ? ?[name] is a 66 y.o. male admitted on 05/12/2021 with respiratory distress and concerns for aspiration PNA. Pharmacy has been consulted for Unasyn dosing. ? ?Plan: ?Unasyn 3g IV q6h ?Follow cultures, Cr, LOT ? ? ?Height: 5\' 5"  (165.1 cm) ?Weight: 75.2 kg (165 lb 12.6 oz) ?IBW/kg (Calculated) : 61.5 ? ?Temp (24hrs), Avg:98.2 ?F (36.8 ?C), Min:97.2 ?F (36.2 ?C), Max:98.8 ?F (37.1 ?C) ? ?Recent Labs  ?Lab 05/12/21 ?05/14/21 05/12/21 ?05/14/21 05/12/21 ?1155 05/12/21 ?1400 05/13/21 ?0736  ?WBC 17.2*  --  18.1*  --  11.6*  ?CREATININE 1.39* 1.10 1.43* 1.30* 1.24  ?LATICACIDVEN 7.7*  --  3.2*  --   --   ?  ?Estimated Creatinine Clearance: 56.3 mL/min (by C-G formula based on SCr of 1.24 mg/dL).   ? ?No Known Allergies ? ? ? ?Thank you for allowing pharmacy to be a part of this patient?s care. ? ?05/15/21, PharmD, BCPS, BCCP ?Clinical Pharmacist ?[phone] ?Please check AMION for all [facility] Pharmacy numbers ?05/13/2021 ? ? ?

## 2021-05-13 NOTE — Procedures (Signed)
Intubation Procedure Note ? ?[name]  ?[id number]  ?1956/01/14 ? ?Date:05/13/21  ?Time:2:51 PM  ? ?Provider Performing:Kydan Shanholtzer  Cyndie Chime  ? ? ?Procedure: Intubation (31500) ? ?Indication(s) ?Respiratory Failure ? ?Consent ?Unable to obtain consent due to emergent nature of procedure. ? ? ?Anesthesia ?Etomidate ?Rocuronium  ? ? ?Time Out ?Verified patient identification, verified procedure, site/side was marked, verified correct patient position, special equipment/implants available, medications/allergies/relevant history reviewed, required imaging and test results available. ? ? ?Sterile Technique ?Usual hand hygeine, masks, and gloves were used ? ? ?Procedure Description ?Patient positioned in bed supine.  Sedation given as noted above.  Patient was intubated with endotracheal tube using Glidescope.  View was Grade 1 full glottis .  Number of attempts was 1.  Colorimetric CO2 detector was consistent with tracheal placement. ? ? ?Complications/Tolerance ?None; patient tolerated the procedure well. ?Chest X-ray is ordered to verify placement. ? ? ?EBL ?Minimal ? ? ?Specimen(s) ?None ? ?

## 2021-05-13 NOTE — Progress Notes (Signed)
RT obtained post intubation ABG and reported the results to Dr.Chand. ABG as follows: 7.23/56.4/96/24. RT increased RR from 24 to 28 per MD. RT will continue to monitor.  ?

## 2021-05-13 NOTE — Progress Notes (Signed)
ANTICOAGULATION CONSULT NOTE ? ?Pharmacy Consult for heparin ?Indication: chest pain/ACS, afib ? ?Heparin Dosing Weight: 70.3 kg ? ?Labs: ?Recent Labs  ?  05/12/21 ?[id number] 05/12/21 ?[id number] 05/12/21 ?0701 05/12/21 ?1155 05/12/21 ?1400 05/12/21 ?2224 05/13/21 ?0736  ?HGB 16.7   < > 17.0 16.8  --   --  14.0  ?HCT 51.8   < > 50.0 49.1  --   --  41.4  ?PLT 312  --   --  333  --   --  245  ?APTT 32  --   --   --   --  52*  --   ?LABPROT 12.6  --   --   --   --   --   --   ?INR 1.0  --   --   --   --   --   --   ?HEPARINUNFRC  --   --   --   --   --  0.41 0.35  ?CREATININE 1.39*   < >  --  1.43* 1.30*  --  1.24  ? < > = values in this interval not displayed.  ? ? ? ?Assessment: ?45 yom with hx of afib presenting with respiratory distress and pulmonary edema, found to have elevated troponin, increasing 17>>861. Pharmacy consulted to dose heparin for ACS and hx afib. Xarelto listed on patient's PTA med profile, but patient now extubated and reports he has not had his Xarelto in "at least 1 month." CBC wnl. No bleed issues reported. ? ?Goal of Therapy:  ?Heparin level 0.3-0.7 units/ml ?Monitor platelets by anticoagulation protocol: Yes ?  ?Plan:  ?Continue heparin at 850 units/hr ?Monitor daily heparin level/CBC, s/sx bleeding ?CCM planning to resume Xarelto soon ? ? ?Arturo Morton, PharmD, BCPS ?Clinical Pharmacist ?05/13/2021 11:03 AM ?

## 2021-05-14 DIAGNOSIS — J810 Acute pulmonary edema: Principal | ICD-10-CM

## 2021-05-14 LAB — CBC
HCT: 38.1 % — ABNORMAL LOW (ref 39.0–52.0)
Hemoglobin: 13.2 g/dL (ref 13.0–17.0)
MCH: 31.1 pg (ref 26.0–34.0)
MCHC: 34.6 g/dL (ref 30.0–36.0)
MCV: 89.9 fL (ref 80.0–100.0)
Platelets: 244 10*3/uL (ref 150–400)
RBC: 4.24 MIL/uL (ref 4.22–5.81)
RDW: 12.9 % (ref 11.5–15.5)
WBC: 13.1 10*3/uL — ABNORMAL HIGH (ref 4.0–10.5)
nRBC: 0 % (ref 0.0–0.2)

## 2021-05-14 LAB — POCT I-STAT 7, (LYTES, BLD GAS, ICA,H+H)
Acid-Base Excess: 3 mmol/L — ABNORMAL HIGH (ref 0.0–2.0)
Bicarbonate: 25.2 mmol/L (ref 20.0–28.0)
Calcium, Ion: 1.1 mmol/L — ABNORMAL LOW (ref 1.15–1.40)
HCT: 38 % — ABNORMAL LOW (ref 39.0–52.0)
Hemoglobin: 12.9 g/dL — ABNORMAL LOW (ref 13.0–17.0)
O2 Saturation: 100 %
Patient temperature: 98.5
Potassium: 3.5 mmol/L (ref 3.5–5.1)
Sodium: 139 mmol/L (ref 135–145)
TCO2: 26 mmol/L (ref 22–32)
pCO2 arterial: 31.3 mmHg — ABNORMAL LOW (ref 32–48)
pH, Arterial: 7.514 — ABNORMAL HIGH (ref 7.35–7.45)
pO2, Arterial: 299 mmHg — ABNORMAL HIGH (ref 83–108)

## 2021-05-14 LAB — GLUCOSE, CAPILLARY
Glucose-Capillary: 90 mg/dL (ref 70–99)
Glucose-Capillary: 106 mg/dL — ABNORMAL HIGH (ref 70–99)
Glucose-Capillary: 111 mg/dL — ABNORMAL HIGH (ref 70–99)
Glucose-Capillary: 112 mg/dL — ABNORMAL HIGH (ref 70–99)
Glucose-Capillary: 91 mg/dL (ref 70–99)
Glucose-Capillary: 133 mg/dL — ABNORMAL HIGH (ref 70–99)

## 2021-05-14 LAB — HEPARIN LEVEL (UNFRACTIONATED)
Heparin Unfractionated: 0.19 IU/mL — ABNORMAL LOW (ref 0.30–0.70)
Heparin Unfractionated: 0.23 IU/mL — ABNORMAL LOW (ref 0.30–0.70)

## 2021-05-14 LAB — BASIC METABOLIC PANEL
Anion gap: 7 (ref 5–15)
BUN: 19 mg/dL (ref 8–23)
CO2: 23 mmol/L (ref 22–32)
Calcium: 7.9 mg/dL — ABNORMAL LOW (ref 8.9–10.3)
Chloride: 105 mmol/L (ref 98–111)
Creatinine, Ser: 1.03 mg/dL (ref 0.61–1.24)
GFR, Estimated: >60 mL/min (ref >60)
Glucose, Bld: 101 mg/dL — ABNORMAL HIGH (ref 70–99)
Potassium: 3.3 mmol/L — ABNORMAL LOW (ref 3.5–5.1)
Sodium: 135 mmol/L (ref 135–145)

## 2021-05-14 LAB — PHOSPHORUS: Phosphorus: 2.1 mg/dL — ABNORMAL LOW (ref 2.5–4.6)

## 2021-05-14 LAB — TROPONIN I (HIGH SENSITIVITY)
Troponin I (High Sensitivity): 641 ng/L (ref <18)
Troponin I (High Sensitivity): 563 ng/L (ref <18)
Troponin I (High Sensitivity): 435 ng/L (ref <18)

## 2021-05-14 LAB — MAGNESIUM: Magnesium: 2.1 mg/dL (ref 1.7–2.4)

## 2021-05-14 LAB — LEGIONELLA PNEUMOPHILA SEROGP 1 UR AG: L. pneumophila Serogp 1 Ur Ag: NEGATIVE

## 2021-05-14 LAB — PROCALCITONIN: Procalcitonin: 0.53 ng/mL

## 2021-05-14 MED ORDER — POTASSIUM CHLORIDE 10 MEQ/50ML IV SOLN
10.0000 meq | INTRAVENOUS | Status: AC
  Administered 2021-05-14 (×2): 10 meq via INTRAVENOUS
  Filled 2021-05-14 (×2): qty 50

## 2021-05-14 MED ORDER — PANTOPRAZOLE 2 MG/ML SUSPENSION
40.0000 mg | Freq: Every day | ORAL | Status: DC
  Administered 2021-05-14 – 2021-05-15 (×2): 40 mg
  Filled 2021-05-14 (×2): qty 20

## 2021-05-14 MED ORDER — POTASSIUM PHOSPHATES 15 MMOLE/5ML IV SOLN
15.0000 mmol | Freq: Once | INTRAVENOUS | Status: AC
  Administered 2021-05-14: 15 mmol via INTRAVENOUS
  Filled 2021-05-14: qty 5

## 2021-05-14 MED ORDER — FUROSEMIDE 10 MG/ML IJ SOLN
40.0000 mg | Freq: Four times a day (QID) | INTRAMUSCULAR | Status: AC
  Administered 2021-05-14 (×2): 40 mg via INTRAVENOUS
  Filled 2021-05-14 (×2): qty 4

## 2021-05-14 MED ORDER — POTASSIUM CHLORIDE 20 MEQ PO PACK
20.0000 meq | PACK | Freq: Once | ORAL | Status: AC
  Administered 2021-05-14: 20 meq
  Filled 2021-05-14: qty 1

## 2021-05-14 MED ORDER — CALCIUM GLUCONATE-NACL 1-0.675 GM/50ML-% IV SOLN
1.0000 g | Freq: Once | INTRAVENOUS | Status: AC
  Administered 2021-05-14: 1000 mg via INTRAVENOUS
  Filled 2021-05-14: qty 50

## 2021-05-14 MED ORDER — SODIUM CHLORIDE 0.9 % IV SOLN: INTRAVENOUS | Status: DC

## 2021-05-14 NOTE — Progress Notes (Addendum)
RT note. ?Patient placed on SBT per Dr. Katrinka Blazing. Patient currently on 5/5 PS/CPAP ?Vt 694, RR 27 patient sat 95% at this time.  ?Will reassess in 20 minutes to extubate per Dr. Katrinka Blazing ? ?1205- Patient flipped back to full support, ^ HR ?

## 2021-05-14 NOTE — Progress Notes (Signed)
ANTICOAGULATION CONSULT NOTE ? ?Pharmacy Consult for heparin ?Indication: chest pain/ACS, afib ? ?Heparin Dosing Weight: 70.3 kg ? ?Labs: ?Recent Labs  ?  05/12/21 ?[id number] 05/12/21 ?[id number] 05/12/21 ?1155 05/12/21 ?1400 05/12/21 ?2224 05/13/21 ?0736 05/13/21 ?1452 05/13/21 ?1617 05/14/21 ?0411 05/14/21 ?[id number] 05/14/21 ?[id number] 05/14/21 ?1718  ?HGB 16.7   < > 16.8  --   --  14.0  --  16.7 12.9* 13.2  --   --   ?HCT 51.8   < > 49.1  --   --  41.4  --  49.0 38.0* 38.1*  --   --   ?PLT 312  --  333  --   --  245  --   --   --  244  --   --   ?APTT 32  --   --   --  52*  --   --   --   --   --   --   --   ?LABPROT 12.6  --   --   --   --   --   --   --   --   --   --   --   ?INR 1.0  --   --   --   --   --   --   --   --   --   --   --   ?HEPARINUNFRC  --   --   --    < > 0.41 0.35  --   --   --   --  0.19* 0.23*  ?CREATININE 1.39*   < > 1.43*   < >  --  1.24 1.21  --   --  1.03  --   --   ? < > = values in this interval not displayed.  ? ? ? ?Assessment: ?5 yom with hx of afib presenting with respiratory distress and pulmonary edema, found to have elevated troponin, trending up. Cardiology planning cath once extubated. Pharmacy consulted to dose heparin for ACS and hx afib. Xarelto listed on patient's PTA med profile, but patient reported when extubated he has not had his Xarelto in "at least 1 month." ? ?Heparin level low at 0.23 this PM  CBC wnl. Renal function improving. No bleeding or issues with infusion per discussion with RN. ? ?Goal of Therapy:  ?Heparin level 0.3-0.7 units/ml ?Monitor platelets by anticoagulation protocol: Yes ?  ?Plan:  ?Increase heparin to 1250 units/hr ?Heparin level in 6 hours ?Monitor daily heparin level/CBC, s/sx bleeding ?F/u Cardiology plans for cath once extubated, resume Xarelto post-cath as appropriate ? ? ?Adaisha Campise A. Levada Dy, PharmD, BCPS, FNKF ?Clinical Pharmacist ?[place] ?Please utilize Amion for appropriate phone number to reach the unit pharmacist ([place]) ? ?05/14/2021 6:24 PM ?

## 2021-05-14 NOTE — Progress Notes (Signed)
[facility] ADULT ICU REPLACEMENT PROTOCOL ? ? ?The patient does apply for the [facility] Adult ICU Electrolyte Replacment Protocol based on the criteria listed below:  ? ?1.Exclusion criteria: TCTS patients, ECMO patients, and Dialysis patients ?2. Is GFR >/= 30 ml/min? Yes.    ?Patient's GFR today is >60 ?3. Is SCr </= 2? Yes.   ?Patient's SCr is 1.03 mg/dL ?4. Did SCr increase >/= 0.5 in 24 hours? No. ?5.Pt's weight >40kg  Yes.   ?6. Abnormal electrolyte(s):  K 3.3, Phos 2.1  ?7. Electrolytes replaced per protocol ?8.  Call MD STAT for K+ </= 2.5, Phos </= 1, or Mag </= 1 ?Physician:  Lona Kettle ? ?Wilburt Finlay 05/14/2021 5:32 AM ? ?

## 2021-05-14 NOTE — Progress Notes (Signed)
? ? ?  Spoke with patient and mother at the bedside as patient remains on the vent but alert and oriented. ? ?Shared Decision Making/Informed Consent ?The risks [stroke (1 in 1000), death (1 in 31), kidney failure [usually temporary] (1 in 500), bleeding (1 in 200), allergic reaction [possibly serious] (1 in 200)], benefits (diagnostic support and management of coronary artery disease) and alternatives of a cardiac catheterization were discussed in detail with [name] and he is willing to proceed. ?  ? ?Orders placed, planned for cardiac cath today as schedule allows ? ?Signed, ?Reino Bellis, NP-C ?05/14/2021, 3:13 PM ?Pager: [phone] ? ? ?

## 2021-05-14 NOTE — Progress Notes (Addendum)
After rounding this morning, we felt it would be better to perform cardiac cath when the patient is more stable and off the ventilator. ?He presented with acute respiratory failure with a pattern of pulmonary edema and subsequently developed mildly elevated troponins.  This was then complicated by very rapid tachycardia that degenerated into WCT ("VT") and was treated by defibrillation by the critical care team yesterday.  No strips are available. ?Again today, attempted to wean the patient and extubate but he became very tachypneic and there was concern that he was redeveloping pulmonary edema. ?Echo demonstrates an EF of 60% without regional wall motion abnormality. ?After speaking with critical care, they want to be certain that critical coronary disease is not present before extubating the patient.  Without wall motion abnormality I doubt that he has severe chronic coronary disease but we should exclude left main/ostial LAD as a cause for flash pulmonary edema.  Patient will remain intubated until cath can be performed. ?Unable to cath until tomorrow. ? ?

## 2021-05-14 NOTE — Progress Notes (Addendum)
? ? ?The patient has been seen in conjunction with Reino Bellis, NP. All aspects of care have been considered and discussed. The patient has been personally interviewed, examined, and all clinical data has been reviewed. ? ?Agree with ote. Continue heparin, extubate, pland cardiac cath when stable post extubation. ?Arrhythmia may have been predominantly SVT with conversion to [facility] due to aberration or accessory pathway, r/o VT. ?May need EP opinion before dc amiodarone. ? ? ?Progress Note ? ?Patient Name: [name] ?Date of Encounter: 05/14/2021 ? ?[facility] Cardiologist: None  ? ?Subjective  ? ?Remains intubated, but alert and nods to questions ? ?Inpatient Medications  ?  ?Scheduled Meds: ? chlorhexidine gluconate (MEDLINE KIT)  15 mL Mouth Rinse BID  ? Chlorhexidine Gluconate Cloth  6 each Topical Daily  ? docusate  100 mg Per Tube BID  ? fentaNYL (SUBLIMAZE) injection  25 mcg Intravenous Once  ? furosemide  40 mg Intravenous Q6H  ? insulin aspart  0-15 Units Subcutaneous Q4H  ? mouth rinse  15 mL Mouth Rinse 10 times per day  ? midazolam  2 mg Intravenous Once  ? pantoprazole (PROTONIX) IV  40 mg Intravenous QHS  ? polyethylene glycol  17 g Per Tube Daily  ? rocuronium bromide  100 mg Intravenous Once  ? ?Continuous Infusions: ? sodium chloride 250 mL (05/14/21 0908)  ? sodium chloride    ? amiodarone 30 mg/hr (05/14/21 0616)  ? ampicillin-sulbactam (UNASYN) IV 3 g (05/14/21 0300)  ? calcium gluconate    ? dexmedetomidine (PRECEDEX) IV infusion Stopped (05/13/21 1757)  ? feeding supplement (VITAL AF 1.2 CAL) Stopped (05/13/21 1001)  ? fentaNYL infusion INTRAVENOUS 200 mcg/hr (05/14/21 9563)  ? heparin 850 Units/hr (05/14/21 0600)  ? nitroGLYCERIN 5 mcg/min (05/13/21 1700)  ? norepinephrine (LEVOPHED) Adult infusion 2 mcg/min (05/14/21 0600)  ? potassium PHOSPHATE IVPB (in mmol) 15 mmol (05/14/21 0651)  ? ?PRN Meds: ?acetaminophen, docusate, fentaNYL, labetalol, ondansetron (ZOFRAN) IV, polyethylene  glycol  ? ?Vital Signs  ?  ?Vitals:  ? 05/14/21 0700 05/14/21 0725 05/14/21 0728 05/14/21 0729  ?BP: (!) 95/55  (!) 95/55   ?Pulse: (!) 56  (!) 56   ?Resp: (!) 28  (!) 28   ?Temp:  98 ?F (36.7 ?C)    ?TempSrc:  Oral    ?SpO2: 97%  97% 97%  ?Weight:      ?Height:      ? ? ?Intake/Output Summary (Last 24 hours) at 05/14/2021 0954 ?Last data filed at 05/14/2021 0940 ?Gross per 24 hour  ?Intake 1681.5 ml  ?Output 1170 ml  ?Net 511.5 ml  ? ? ?  05/13/2021  ?  3:44 AM 05/12/2021  ?  5:21 AM 06/10/2018  ?  8:33 AM  ?Last 3 Weights  ?Weight (lbs) 165 lb 12.6 oz 154 lb 15.7 oz 155 lb  ?Weight (kg) 75.2 kg 70.3 kg 70.308 kg  ?   ? ?Telemetry  ?  ?SR-ST, intermittent episodes of narrow complex tachycardia possible atrial tach, PACs - Personally Reviewed ? ?ECG  ?  ?No new tracing this morning ? ?Physical Exam  ? ?GEN: Thin, older male, intubated but alert ?Neck: No JVD ?Cardiac: RRR, no murmurs, rubs, or gallops.  ?Respiratory: course breath sounds ?GI: Soft, nontender, non-distended  ?MS: No edema; No deformity. ?Neuro:  Nonfocal  ?Psych: Normal affect  ? ?Labs  ?  ?High Sensitivity Troponin:   ?Recent Labs  ?Lab 05/12/21 ?1815 05/13/21 ?1452 05/13/21 ?1609 05/13/21 ?1748 05/14/21 ?8756  ?TROPONINIHS 731*  254* 641* 991* 563*  ?   ?Chemistry ?Recent Labs  ?Lab 05/12/21 ?9326 05/12/21 ?7124 05/13/21 ?5809 05/13/21 ?1452 05/13/21 ?1617 05/14/21 ?0411 05/14/21 ?0440  ?NA 137   < > 139 137 137 139 135  ?K 2.7*   < > 3.7 3.6 3.9 3.5 3.3*  ?CL 104   < > 109 107  --   --  105  ?CO2 16*   < > 21* 19*  --   --  23  ?GLUCOSE 298*   < > 147* 201*  --   --  101*  ?BUN 16   < > 22 24*  --   --  19  ?CREATININE 1.39*   < > 1.24 1.21  --   --  1.03  ?CALCIUM 9.1   < > 8.7* 8.8*  --   --  7.9*  ?MG  --    < > 1.8 2.0  --   --  2.1  ?PROT 6.8  --   --   --   --   --   --   ?ALBUMIN 3.2*  --   --   --   --   --   --   ?AST 26  --   --   --   --   --   --   ?ALT 10  --   --   --   --   --   --   ?ALKPHOS 78  --   --   --   --   --   --   ?BILITOT 0.6   --   --   --   --   --   --   ?GFRNONAA 56*   < > >60 >60  --   --  >60  ?ANIONGAP 17*   < > 9 11  --   --  7  ? < > = values in this interval not displayed.  ?  ?Lipids  ?Recent Labs  ?Lab 05/13/21 ?0736  ?TRIG 128  ?  ?Hematology ?Recent Labs  ?Lab 05/12/21 ?1155 05/13/21 ?0736 05/13/21 ?1617 05/14/21 ?0411 05/14/21 ?0440  ?WBC 18.1* 11.6*  --   --  13.1*  ?RBC 5.30 4.48  --   --  4.24  ?HGB 16.8 14.0 16.7 12.9* 13.2  ?HCT 49.1 41.4 49.0 38.0* 38.1*  ?MCV 92.6 92.4  --   --  89.9  ?MCH 31.7 31.3  --   --  31.1  ?MCHC 34.2 33.8  --   --  34.6  ?RDW 12.7 13.0  --   --  12.9  ?PLT 333 245  --   --  244  ? ?Thyroid No results for input(s): TSH, FREET4 in the last 168 hours.  ?BNP ?Recent Labs  ?Lab 05/12/21 ?0527  ?BNP 742.2*  ?  ?DDimer No results for input(s): DDIMER in the last 168 hours.  ? ?Radiology  ?  ?DG CHEST PORT 1 VIEW ? ?Result Date: 05/13/2021 ?CLINICAL DATA:  Central line placement. EXAM: PORTABLE CHEST 1 VIEW COMPARISON:  May 13, 2021 (3:55 p.m.) FINDINGS: Since the prior study there is been interval placement of a left-sided venous catheter. Its distal tip sits within the superior vena cava, approximally 2.4 cm proximal to the junction of the superior vena cava and right atrium. Stable endotracheal tube and nasogastric tube positioning is noted. Stable diffuse bilateral airspace disease is seen. There is no evidence of a pleural effusion or pneumothorax. The heart size and mediastinal  contours are within normal limits. Degenerative changes seen throughout the thoracic spine. IMPRESSION: Interval left-sided venous catheter placement and positioning, as described above, without additional significant interval changes. Electronically Signed   By: Virgina Norfolk M.D.   On: 05/13/2021 19:42  ? ?DG Chest Port 1 View ? ?Result Date: 05/13/2021 ?CLINICAL DATA:  Encounter for intubation EXAM: PORTABLE CHEST 1 VIEW COMPARISON:  05/13/2021 FINDINGS: Endotracheal tube in good position. NG tube tip not  visualized off the lower edge of the image. Progression of symmetric and diffuse bilateral airspace disease most likely pulmonary edema. Bibasilar atelectasis. Lung bases not included IMPRESSION: Endotracheal tube in good position. Interval development of diffuse bilateral airspace disease consistent with pulmonary edema. Electronically Signed   By: Franchot Gallo M.D.   On: 05/13/2021 16:08  ? ?DG Chest Port 1 View ? ?Result Date: 05/13/2021 ?CLINICAL DATA:  Respiratory distress. EXAM: PORTABLE CHEST 1 VIEW COMPARISON:  May 12, 2021. FINDINGS: The heart size and mediastinal contours are within normal limits. Both lungs are clear. Endotracheal and nasogastric tubes are unchanged. The visualized skeletal structures are unremarkable. IMPRESSION: Stable support apparatus. No acute cardiopulmonary abnormality seen. Electronically Signed   By: Marijo Conception M.D.   On: 05/13/2021 07:37  ? ?DG CHEST PORT 1 VIEW ? ?Result Date: 05/12/2021 ?CLINICAL DATA:  Difficulty breathing EXAM: PORTABLE CHEST 1 VIEW COMPARISON:  Previous studies including the examination done earlier today FINDINGS: Transverse diameter of heart is slightly increased. Tip of endotracheal tube is proximally 5.9 cm above the carina. Enteric tube is noted traversing the esophagus. There is interval decrease in pulmonary vascular congestion and pulmonary edema. Still, there is residual prominence of interstitial and alveolar markings in the parahilar regions and lower lung fields consistent with residual pulmonary edema. Costophrenic angles are clear. There is no pneumothorax. IMPRESSION: Partial clearing of pulmonary edema. Electronically Signed   By: Elmer Picker M.D.   On: 05/12/2021 14:17  ? ?DG Abd Portable 1V ? ?Result Date: 05/13/2021 ?CLINICAL DATA:  Orogastric tube placement EXAM: PORTABLE ABDOMEN - 1 VIEW COMPARISON:  Portable exam 1556 hours bowel priors for comparison FINDINGS: Tip of orogastric tube projects over proximal to mid stomach.  External pacing leads present. BILATERAL pulmonary infiltrates. Nonobstructive bowel gas pattern. IMPRESSION: Tip of orogastric tube projects over proximal to mid stomach. Electronically Signed   By: Dionne Milo

## 2021-05-14 NOTE — Progress Notes (Signed)
? ?NAME:  [name], MRN:  [id number], DOB:  [birth year], LOS: 2 ?ADMISSION DATE:  05/12/2021, CONSULTATION DATE:  05/12/21 ?REFERRING MD:  EDP, CHIEF COMPLAINT:  SOB  ? ?History of Present Illness:  ?66 yo with pmh R MCA cva in 2016, R occluded ICA, h/o afib  presented in resp distress via ems. All history obtained from edp and chart review as pt is intubated and sedated. Pt was noted to have called ems 2/2 resp distress, sudden onset. Upon arrival emergency found oxygen saturations to be in 30's on RA. Was attempted on cpap but did not tolerate this and arrived to hospital on no supplemental oxygen. He was immediately placed on NRB with increase in sats to 98%.  Pt is tachycardic and hypertensive. Cxr revealed bilateral airspace disease suspicious for acute pulm edema.  ? ?Pertinent  Medical History  ?As above ? ?Significant Hospital Events: ?Including procedures, antibiotic start and stop dates in addition to other pertinent events   ?3/27: intubated and admitted to [facility] ?3/28 Seen alert and interactive on vent ?3/29 Interactive and able to follow commands on vent, no arrhythmias overnight  ? ?Interim History / Subjective:  ?No acute issues overnight  ?Alert on vent  ? ?Objective   ?Blood pressure (!) 95/55, pulse (!) 56, temperature 98.5 ?F (36.9 ?C), temperature source Oral, resp. rate (!) 28, height 5\' 5"  (1.651 m), weight 75.2 kg, SpO2 97 %. ?   ?Vent Mode: PRVC ?FiO2 (%):  [40 %-100 %] 40 % ?Set Rate:  [24 bmp-28 bmp] 28 bmp ?Vt Set:  [500 mL] 500 mL ?PEEP:  [5 cmH20] 5 cmH20 ?Pressure Support:  [10 cmH20] 10 cmH20 ?Plateau Pressure:  [23 cmH20-24 cmH20] 24 cmH20  ? ?Intake/Output Summary (Last 24 hours) at 05/14/2021 0718 ?Last data filed at 05/14/2021 0600 ?Gross per 24 hour  ?Intake 1620.33 ml  ?Output 860 ml  ?Net 760.33 ml  ? ? ?Filed Weights  ? 05/12/21 0521 05/13/21 0344  ?Weight: 70.3 kg 75.2 kg  ? ? ?Examination: ?General: Acute on chronically ill appearing middle aged male  on mechanical ventilation, in  NAD ?HEENT: ETT, MM pink/moist, PERRL,  ?Neuro: Alert and interactive on vent  ?CV: s1s2 regular rate and rhythm, no murmur, rubs, or gallops,  ?PULM:  Clear to ascultation, no increased work of breathing, no added breath sounds ?GI: soft, bowel sounds active in all 4 quadrants, non-tender, non-distended, tolerating TF ?Extremities: warm/dry, no edema  ?Skin: no rashes or lesions ? ?Resolved Hospital Problem list   ?HTN emergency ?Mild AKI ? ?Assessment & Plan:  ?Acute hypoxic/hypercarbic resp failure  ?-Secondary to flash pulmonary edema in the setting of hypertensive emergency  ?-ECHO 60-65% with no WMA ?-Unfortunately patient was reintubated afternoon of 3/28 after witnessed transition to V-tach (never arrested)  ?Concern for aspiration event  ?-During reintubation moderate amount of secretions seen near vocal cords  ?P: ?Continue ventilator support with lung protective strategies  ?Wean PEEP and FiO2 for sats greater than 90%. ?Head of bed elevated 30 degrees. ?Plateau pressures less than 30 cm H20.  ?Follow intermittent chest x-ray and ABG.   ?SAT/SBT as tolerated, mentation preclude extubation  ?Ensure adequate pulmonary hygiene  ?Follow cultures  ?VAP bundle in place  ?PAD protocol ?Continue empiric Unasyn  ? ?Narrow complex tachycardia with transition to wide complex tachycardia  ?-Sudden onset afternoon of 3/28 converted with emergent cardioversion x1 ?Elevated troponin  ?-Peak at 873, likely secondary to demand ischemia  ?H/o afib  ?-Anticoagulated at baseline with   ?  Hx of HTN/HLD ?-Home medications includes; Lipitor, Zetia, Lisinopril,  ?P: ?Cardiology following, appreciate assistance ?May need to consider ischemic evaluation  ?Continue to trend troponins ?Continuous telemetry  ?Continue IV amio ?Pressors for MAP goal > 65 ?Optimize electrolytes  ?Continue heparin drip  ? ?H/o R MCA cva ?-No deficits  ?P: ?Maintain neuro protective measures ?Nutrition and bowel regiment  ?Seizure precautions   ?Aspirations precautions  ? ?Hypokalemia  ?Hypophosphoremia  ?Hypocalcemia ?P:  ?Trend bmet  ?Supplement as needed ? ?Best Practice (right click and "Reselect all SmartList Selections" daily)  ? ?Diet/type: NPO ?DVT prophylaxis: LMWH ?GI prophylaxis: PPI ?Lines: N/A ?Foley:  N/A ?Code Status:  full code ?Last date of multidisciplinary goals of care discussion: Pending contact with family ? ?Critical care time: 59  ?Tahari Clabaugh D. Harris, NP-C ?[facility] ?Personal contact information can be found on Amion  ?05/14/2021, 7:18 AM ? ? ? ? ?  ?

## 2021-05-14 NOTE — Progress Notes (Signed)
ANTICOAGULATION CONSULT NOTE ? ?Pharmacy Consult for heparin ?Indication: chest pain/ACS, afib ? ?Heparin Dosing Weight: 70.3 kg ? ?Labs: ?Recent Labs  ?  05/12/21 ?[id number] 05/12/21 ?[id number] 05/12/21 ?1155 05/12/21 ?1400 05/12/21 ?2224 05/13/21 ?[id number] 05/13/21 ?1452 05/13/21 ?1617 05/14/21 ?0411 05/14/21 ?[id number] 05/14/21 ?[id number]  ?HGB 16.7   < > 16.8  --   --  14.0  --  16.7 12.9* 13.2  --   ?HCT 51.8   < > 49.1  --   --  41.4  --  49.0 38.0* 38.1*  --   ?PLT 312  --  333  --   --  245  --   --   --  244  --   ?APTT 32  --   --   --  52*  --   --   --   --   --   --   ?LABPROT 12.6  --   --   --   --   --   --   --   --   --   --   ?INR 1.0  --   --   --   --   --   --   --   --   --   --   ?HEPARINUNFRC  --   --   --   --  0.41 0.35  --   --   --   --  0.19*  ?CREATININE 1.39*   < > 1.43*   < >  --  1.24 1.21  --   --  1.03  --   ? < > = values in this interval not displayed.  ? ? ? ?Assessment: ?43 yom with hx of afib presenting with respiratory distress and pulmonary edema, found to have elevated troponin, trending up. Cardiology planning cath once extubated. Pharmacy consulted to dose heparin for ACS and hx afib. Xarelto listed on patient's PTA med profile, but patient reported when extubated he has not had his Xarelto in "at least 1 month." ? ?Heparin level low at 0.19 this AM after previously therapeutic. CBC wnl. Renal function improving. No bleeding or issues with infusion per discussion with RN. ? ?Goal of Therapy:  ?Heparin level 0.3-0.7 units/ml ?Monitor platelets by anticoagulation protocol: Yes ?  ?Plan:  ?Increase heparin to 1050 units/hr ?Monitor daily heparin level/CBC, s/sx bleeding ?F/u Cardiology plans for cath once extubated, resume Xarelto post-cath as appropriate ? ? ?Arturo Morton, PharmD, BCPS ?Clinical Pharmacist ?05/14/2021 10:40 AM ?

## 2021-05-15 ENCOUNTER — Encounter (HOSPITAL_COMMUNITY)
Admission: EM | Disposition: A | Discharge status: Home / Self Care | Payer: Self-pay | Source: Home / Self Care | Attending: Internal Medicine

## 2021-05-15 ENCOUNTER — Encounter (HOSPITAL_COMMUNITY): Payer: Self-pay | Admitting: Interventional Cardiology

## 2021-05-15 DIAGNOSIS — E440 Moderate protein-calorie malnutrition: Secondary | ICD-10-CM

## 2021-05-15 DIAGNOSIS — J9602 Acute respiratory failure with hypercapnia: Secondary | ICD-10-CM

## 2021-05-15 DIAGNOSIS — J9601 Acute respiratory failure with hypoxia: Secondary | ICD-10-CM | POA: Diagnosis not present

## 2021-05-15 DIAGNOSIS — J810 Acute pulmonary edema: Secondary | ICD-10-CM | POA: Diagnosis not present

## 2021-05-15 HISTORY — PX: LEFT HEART CATH AND CORONARY ANGIOGRAPHY: CATH118249

## 2021-05-15 LAB — GLUCOSE, CAPILLARY
Glucose-Capillary: 110 mg/dL — ABNORMAL HIGH (ref 70–99)
Glucose-Capillary: 118 mg/dL — ABNORMAL HIGH (ref 70–99)
Glucose-Capillary: 124 mg/dL — ABNORMAL HIGH (ref 70–99)
Glucose-Capillary: 163 mg/dL — ABNORMAL HIGH (ref 70–99)
Glucose-Capillary: 98 mg/dL (ref 70–99)

## 2021-05-15 LAB — BASIC METABOLIC PANEL
Anion gap: 9 (ref 5–15)
BUN: 15 mg/dL (ref 8–23)
CO2: 25 mmol/L (ref 22–32)
Calcium: 8.6 mg/dL — ABNORMAL LOW (ref 8.9–10.3)
Chloride: 103 mmol/L (ref 98–111)
Creatinine, Ser: 0.95 mg/dL (ref 0.61–1.24)
GFR, Estimated: >60 mL/min (ref >60)
Glucose, Bld: 138 mg/dL — ABNORMAL HIGH (ref 70–99)
Potassium: 3.4 mmol/L — ABNORMAL LOW (ref 3.5–5.1)
Sodium: 137 mmol/L (ref 135–145)

## 2021-05-15 LAB — MAGNESIUM: Magnesium: 1.8 mg/dL (ref 1.7–2.4)

## 2021-05-15 LAB — HEPARIN LEVEL (UNFRACTIONATED)
Heparin Unfractionated: 0.29 IU/mL — ABNORMAL LOW (ref 0.30–0.70)
Heparin Unfractionated: 0.33 IU/mL (ref 0.30–0.70)
Heparin Unfractionated: 0.43 IU/mL (ref 0.30–0.70)

## 2021-05-15 LAB — CBC
HCT: 39.2 % (ref 39.0–52.0)
Hemoglobin: 13.8 g/dL (ref 13.0–17.0)
MCH: 31.5 pg (ref 26.0–34.0)
MCHC: 35.2 g/dL (ref 30.0–36.0)
MCV: 89.5 fL (ref 80.0–100.0)
Platelets: 237 10*3/uL (ref 150–400)
RBC: 4.38 MIL/uL (ref 4.22–5.81)
RDW: 13 % (ref 11.5–15.5)
WBC: 10.9 10*3/uL — ABNORMAL HIGH (ref 4.0–10.5)
nRBC: 0 % (ref 0.0–0.2)

## 2021-05-15 LAB — PHOSPHORUS: Phosphorus: 2.5 mg/dL (ref 2.5–4.6)

## 2021-05-15 SURGERY — LEFT HEART CATH AND CORONARY ANGIOGRAPHY
Anesthesia: LOCAL

## 2021-05-15 MED ORDER — ACETAMINOPHEN 325 MG PO TABS: 650.0000 mg | ORAL_TABLET | ORAL | Status: DC | PRN

## 2021-05-15 MED ORDER — SODIUM CHLORIDE 0.9% FLUSH
3.0000 mL | Freq: Two times a day (BID) | INTRAVENOUS | Status: DC
  Administered 2021-05-16 – 2021-05-19 (×7): 3 mL via INTRAVENOUS

## 2021-05-15 MED ORDER — MIDAZOLAM HCL 2 MG/2ML IJ SOLN
INTRAMUSCULAR | Status: DC | PRN
  Administered 2021-05-15: 2 mg via INTRAVENOUS

## 2021-05-15 MED ORDER — HEPARIN SODIUM (PORCINE) 1000 UNIT/ML IJ SOLN
INTRAMUSCULAR | Status: AC
  Filled 2021-05-15: qty 10

## 2021-05-15 MED ORDER — VERAPAMIL HCL 2.5 MG/ML IV SOLN
INTRAVENOUS | Status: DC | PRN
  Administered 2021-05-15: 2 mg via INTRAVENOUS

## 2021-05-15 MED ORDER — LIDOCAINE HCL (PF) 1 % IJ SOLN
INTRAMUSCULAR | Status: DC | PRN
  Administered 2021-05-15: 2 mL

## 2021-05-15 MED ORDER — SODIUM CHLORIDE 0.9% FLUSH: 3.0000 mL | INTRAVENOUS | Status: DC | PRN

## 2021-05-15 MED ORDER — ONDANSETRON HCL 4 MG/2ML IJ SOLN: 4.0000 mg | Freq: Four times a day (QID) | INTRAMUSCULAR | Status: DC | PRN

## 2021-05-15 MED ORDER — ORAL CARE MOUTH RINSE
15.0000 mL | Freq: Two times a day (BID) | OROMUCOSAL | Status: DC
  Administered 2021-05-15 – 2021-05-18 (×7): 15 mL via OROMUCOSAL

## 2021-05-15 MED ORDER — HEPARIN (PORCINE) IN NACL 1000-0.9 UT/500ML-% IV SOLN
INTRAVENOUS | Status: DC | PRN
  Administered 2021-05-15 (×2): 500 mL

## 2021-05-15 MED ORDER — HEPARIN SODIUM (PORCINE) 1000 UNIT/ML IJ SOLN
INTRAMUSCULAR | Status: DC | PRN
  Administered 2021-05-15: 4000 [IU] via INTRAVENOUS

## 2021-05-15 MED ORDER — HEPARIN (PORCINE) 25000 UT/250ML-% IV SOLN
1450.0000 [IU]/h | INTRAVENOUS | Status: AC
  Administered 2021-05-16: 1450 [IU]/h via INTRAVENOUS
  Filled 2021-05-15: qty 250

## 2021-05-15 MED ORDER — POTASSIUM CHLORIDE 10 MEQ/50ML IV SOLN
10.0000 meq | INTRAVENOUS | Status: AC
  Administered 2021-05-15 (×4): 10 meq via INTRAVENOUS
  Filled 2021-05-15 (×4): qty 50

## 2021-05-15 MED ORDER — SODIUM CHLORIDE 0.9 % IV SOLN: 250.0000 mL | INTRAVENOUS | Status: DC | PRN

## 2021-05-15 MED ORDER — MAGNESIUM SULFATE 2 GM/50ML IV SOLN
2.0000 g | Freq: Once | INTRAVENOUS | Status: AC
  Administered 2021-05-15: 2 g via INTRAVENOUS
  Filled 2021-05-15: qty 50

## 2021-05-15 MED ORDER — LABETALOL HCL 5 MG/ML IV SOLN: 10.0000 mg | INTRAVENOUS | Status: AC | PRN

## 2021-05-15 MED ORDER — HEPARIN SODIUM (PORCINE) 5000 UNIT/ML IJ SOLN: 5000.0000 [IU] | Freq: Three times a day (TID) | INTRAMUSCULAR | Status: DC

## 2021-05-15 MED ORDER — IOHEXOL 350 MG/ML SOLN
INTRAVENOUS | Status: DC | PRN
  Administered 2021-05-15: 30 mL

## 2021-05-15 MED ORDER — HYDRALAZINE HCL 20 MG/ML IJ SOLN: 10.0000 mg | INTRAMUSCULAR | Status: AC | PRN

## 2021-05-15 MED ORDER — SODIUM CHLORIDE 0.9% FLUSH
3.0000 mL | Freq: Two times a day (BID) | INTRAVENOUS | Status: DC
  Administered 2021-05-15: 3 mL via INTRAVENOUS

## 2021-05-15 MED ORDER — FUROSEMIDE 10 MG/ML IJ SOLN
60.0000 mg | Freq: Once | INTRAMUSCULAR | Status: AC
  Administered 2021-05-15: 60 mg via INTRAVENOUS
  Filled 2021-05-15: qty 6

## 2021-05-15 MED ORDER — HEPARIN (PORCINE) IN NACL 1000-0.9 UT/500ML-% IV SOLN
INTRAVENOUS | Status: AC
Start: 2021-05-15 — End: ?
  Filled 2021-05-15: qty 1000

## 2021-05-15 MED ORDER — AMIODARONE LOAD VIA INFUSION
150.0000 mg | Freq: Once | INTRAVENOUS | Status: AC
  Administered 2021-05-15: 150 mg via INTRAVENOUS
  Filled 2021-05-15: qty 83.34

## 2021-05-15 MED ORDER — LIDOCAINE HCL (PF) 1 % IJ SOLN
INTRAMUSCULAR | Status: AC
  Filled 2021-05-15: qty 30

## 2021-05-15 MED ORDER — VERAPAMIL HCL 2.5 MG/ML IV SOLN
INTRAVENOUS | Status: DC | PRN
  Administered 2021-05-15: 10 mL via INTRA_ARTERIAL

## 2021-05-15 MED ORDER — METOPROLOL TARTRATE 5 MG/5ML IV SOLN
INTRAVENOUS | Status: AC
  Filled 2021-05-15: qty 5

## 2021-05-15 MED ORDER — ASPIRIN 81 MG PO CHEW
81.0000 mg | CHEWABLE_TABLET | Freq: Once | ORAL | Status: AC
  Administered 2021-05-15: 81 mg
  Filled 2021-05-15: qty 1

## 2021-05-15 MED ORDER — SODIUM CHLORIDE 0.9% FLUSH
3.0000 mL | INTRAVENOUS | Status: DC | PRN
  Administered 2021-05-17: 3 mL via INTRAVENOUS

## 2021-05-15 MED ORDER — MIDAZOLAM HCL 2 MG/2ML IJ SOLN
INTRAMUSCULAR | Status: AC
  Filled 2021-05-15: qty 2

## 2021-05-15 MED ORDER — METOPROLOL TARTRATE 5 MG/5ML IV SOLN
INTRAVENOUS | Status: DC | PRN
  Administered 2021-05-15 (×2): 2.5 mg via INTRAVENOUS

## 2021-05-15 MED ORDER — VERAPAMIL HCL 2.5 MG/ML IV SOLN
INTRAVENOUS | Status: AC
  Filled 2021-05-15: qty 2

## 2021-05-15 SURGICAL SUPPLY — 12 items
BAND ZEPHYR COMPRESS 30 LONG (HEMOSTASIS) ×1 IMPLANT
CATH 5FR JL3.5 JR4 ANG PIG MP (CATHETERS) ×1 IMPLANT
ELECT DEFIB PAD ADLT CADENCE (PAD) ×1 IMPLANT
GLIDESHEATH SLEND SS 6F .021 (SHEATH) ×1 IMPLANT
GUIDEWIRE INQWIRE 1.5J.035X260 (WIRE) IMPLANT
INQWIRE 1.5J .035X260CM (WIRE) ×2
KIT HEART LEFT (KITS) ×2 IMPLANT
MAT PREVALON FULL STRYKER (MISCELLANEOUS) ×1 IMPLANT
PACK CARDIAC CATHETERIZATION (CUSTOM PROCEDURE TRAY) ×2 IMPLANT
SHEATH PROBE COVER 6X72 (BAG) ×1 IMPLANT
TRANSDUCER W/STOPCOCK (MISCELLANEOUS) ×2 IMPLANT
TUBING CIL FLEX 10 FLL-RA (TUBING) ×2 IMPLANT

## 2021-05-15 NOTE — Progress Notes (Signed)
[facility] ADULT ICU REPLACEMENT PROTOCOL ? ? ?The patient does apply for the [facility] Adult ICU Electrolyte Replacment Protocol based on the criteria listed below:  ? ?1.Exclusion criteria: TCTS patients, ECMO patients, and Dialysis patients ?2. Is GFR >/= 30 ml/min? Yes.    ?Patient's GFR today is >60 ?3. Is SCr </= 2? Yes.   ?Patient's SCr is 0.95 mg/dL ?4. Did SCr increase >/= 0.5 in 24 hours? No. ?5.Pt's weight >40kg  Yes.   ?6. Abnormal electrolyte(s):   K 3.4, Mg 1.8  ?7. Electrolytes replaced per protocol ?8.  Call MD STAT for K+ </= 2.5, Phos </= 1, or Mag </= 1 ?Physician:  V. Yap ? ?[name] 05/15/2021 6:36 AM ? ?

## 2021-05-15 NOTE — Progress Notes (Signed)
Nutrition Follow-up ? ?DOCUMENTATION CODES:  ? ?Non-severe (moderate) malnutrition in context of chronic illness ? ?INTERVENTION:  ? ?- Ensure Enlive po TID, each supplement provides 350 kcal and 20 grams of protein ? ?- Liberalize diet to Regular ? ?- MVI with minerals daily ? ?NUTRITION DIAGNOSIS:  ? ?Moderate Malnutrition related to chronic illness (CVA) as evidenced by mild fat depletion, mild muscle depletion. ? ?Ongoing, being addressed via diet advancement and supplements ? ?GOAL:  ? ?Patient will meet greater than or equal to 90% of their needs ? ?Progressing ? ?MONITOR:  ? ?PO intake, Supplement acceptance, Labs, Weight trends ? ?REASON FOR ASSESSMENT:  ? ?Ventilator, Consult ?Enteral/tube feeding initiation and management ? ?ASSESSMENT:  ? ?66 year old male who presented to the ED on 3/17 with respiratory distress. Pt required intubation in the ED. PMH of R MCA CVA in 2016, R occluded ICA, atrial fibrillation. ? ?03/28 - extubated, s/p DCCV, required reintubation ?03/30 - s/p L cardiac cath, extubated, diet advanced to Heart Healthy ? ?Discussed pt with RN and during ICU rounds. Pt doing well today. Spoke with pt at bedside. He reports that he did not eat much of breakfast because he didn't like the taste. Given malnutrition and poor PO intake, will liberalize diet to Regular to provide more options. RD provided pt with an Ensure at time of visit; RN aware. Pt enjoys chocolate flavor. Will order TID. Will also order daily MVI with minerals. ? ?Admit weight: 70.3 kg ?Current weight: 71.3 kg ? ?Medications reviewed and include: SSI q 4 hours, protonix, amiodarone drip, IV abx, heparin drip ? ?Labs reviewed: sodium 133, WBC 13.4 ?CBG's: 93-163 x 24 hours ? ?UOP: 2835 ml x 24 hours ? ?Diet Order:   ?Diet Order   ? ?       ?  Diet regular Room service appropriate? Yes; Fluid consistency: Thin  Diet effective now       ?  ? ?  ?  ? ?  ? ? ?EDUCATION NEEDS:  ? ?Education needs have been addressed ? ?Skin:  Skin  Assessment: Reviewed RN Assessment ? ?Last BM:  05/12/21 ? ?Height:  ? ?Ht Readings from Last 1 Encounters:  ?05/14/21 5\' 5"  (1.651 m)  ? ? ?Weight:  ? ?Wt Readings from Last 1 Encounters:  ?05/16/21 71.3 kg  ? ? ?BMI:  Body mass index is 26.16 kg/m?. ? ?Estimated Nutritional Needs:  ? ?Kcal:  1800-2000 ? ?Protein:  100-120 grams ? ?Fluid:  1.8-2.0 L ? ? ? ?05/18/21, MS, RD, LDN ?Inpatient Clinical Dietitian ?Please see AMiON for contact information. ? ?

## 2021-05-15 NOTE — Progress Notes (Signed)
OT Cancellation Note ? ?Patient Details ?Name: [name] ?MRN: [id number] ?DOB: August 16, 1955 ? ? ?Cancelled Treatment:    Reason Eval/Treat Not Completed: Patient not medically ready Pt awaiting cardiac cath and RN request postpone therapy prior to procedure to let pt rest. OT will follow back tomorrow to assess appropriateness of treatment. ?  ?Layla Maw ?05/15/2021, 8:57 AM ?

## 2021-05-15 NOTE — Progress Notes (Signed)
No PT/OT f/u noted at this time. TOC will continue to follow with needs. ?

## 2021-05-15 NOTE — Progress Notes (Signed)
ANTICOAGULATION CONSULT NOTE ? ?Pharmacy Consult for heparin ?Indication: chest pain/ACS, afib ? ?Heparin Dosing Weight: 70.3 kg ? ?Labs: ?Recent Labs  ?  05/12/21 ?2224 05/13/21 ?0736 05/13/21 ?1452 05/13/21 ?1617 05/14/21 ?0411 05/14/21 ?0440 05/14/21 ?[id number] 05/15/21 ?0135 05/15/21 ?[id number] 05/15/21 ?0940  ?HGB  --  14.0  --    < > 12.9* 13.2  --   --  13.8  --   ?HCT  --  41.4  --    < > 38.0* 38.1*  --   --  39.2  --   ?PLT  --  245  --   --   --  244  --   --  237  --   ?APTT 52*  --   --   --   --   --   --   --   --   --   ?HEPARINUNFRC 0.41 0.35  --   --   --   --    < > 0.29* 0.33 0.43  ?CREATININE  --  1.24 1.21  --   --  1.03  --   --  0.95  --   ? < > = values in this interval not displayed.  ? ? ? ?Assessment: ?64 yom with hx of afib presenting with respiratory distress and pulmonary edema, found to have elevated troponin, trending up. Cardiology planning cath once extubated. Pharmacy consulted to dose heparin for ACS and hx afib. Xarelto listed on patient's PTA med profile, but patient reported when extubated he has not had his Xarelto in "at least 1 month." ? ?Heparin level therapeutic: 0.33  CBC wnl. Renal function improving. No bleeding or issues with infusion per discussion with RN. ? ?Goal of Therapy:  ?Heparin level 0.3-0.7 units/ml ?Monitor platelets by anticoagulation protocol: Yes ?  ?Plan:  ?Continue heparin at 1350 units/hr ?Monitor daily heparin level/CBC, s/sx bleeding ?F/u Cardiology plans after cath today ?F/u plans to resume Xarelto post-cath as appropriate ? ?Alanda Slim, PharmD, FCCM ?Clinical Pharmacist ?Please see AMION for all Pharmacists' Contact Phone Numbers ?05/15/2021, 11:10 AM  ? ?

## 2021-05-15 NOTE — Progress Notes (Signed)
PT Cancellation Note ? ?Patient Details ?Name: [name] ?MRN: [id number] ?DOB: November 19, 1955 ? ? ?Cancelled Treatment:    Reason Eval/Treat Not Completed: (P) Medical issues which prohibited therapy Pt awaiting cardiac cath and RN request postpone therapy prior to procedure to let pt rest. PT will follow back tomorrow to assess appropriateness of treatment. ? ?Sally Reimers B. Beverely Risen PT, DPT ?Acute Rehabilitation Services ?Pager [phone] ?Office [phone] ? ? ? ?[name] ?05/15/2021, 8:55 AM ? ? ?

## 2021-05-15 NOTE — Procedures (Signed)
Extubation Procedure Note ? ?Patient Details:   ?Name: [name] ?DOB: July 29, 1955 ?MRN: [id number] ?  ?Airway Documentation:  ?Airway 7.5 mm (Active)  ?Secured at (cm) 25 cm 05/15/21 1506  ?Measured From Lips 05/15/21 1506  ?Secured Location Right 05/15/21 1506  ?Secured By [employer] 05/15/21 1506  ?Tube Holder Repositioned Yes 05/15/21 1506  ?Prone position No 05/15/21 1201  ?Cuff Pressure (cm H2O) Clear OR 27-39 CmH2O 05/15/21 1201  ?Site Condition Dry 05/15/21 1506  ? ?Vent end date: 05/13/21 Vent end time: 0905  ? ?Evaluation ? O2 sats: stable throughout ?Complications: No apparent complications ?Patient did tolerate procedure well. ?Bilateral Breath Sounds: Clear, Diminished ?  ?Yes ?Patient extubated to 5 L [place]. No labored breathing noted at this time. No stridor noted. Clear BS ? ?Reatha Harps ?05/15/2021, 4:11 PM ? ?

## 2021-05-15 NOTE — H&P (View-Only) (Signed)
? ?Progress Note ? ?Patient Name: [name] ?Date of Encounter: 05/15/2021 ? ?[facility] Cardiologist: None Cristopher Peru MD ? ?Subjective  ? ?Remained intubated overnight. ? ?Attempt to wean from ventilator yesterday led to recurrent respiratory distress. ? ?After speaking with critical care, we have decided to proceed with cardiac catheterization with the patient intubated as it may be safer. ? ?Denies any chest pain during stay in hospital. ? ?Inpatient Medications  ?  ?Scheduled Meds: ? chlorhexidine gluconate (MEDLINE KIT)  15 mL Mouth Rinse BID  ? Chlorhexidine Gluconate Cloth  6 each Topical Daily  ? docusate  100 mg Per Tube BID  ? fentaNYL (SUBLIMAZE) injection  25 mcg Intravenous Once  ? insulin aspart  0-15 Units Subcutaneous Q4H  ? mouth rinse  15 mL Mouth Rinse 10 times per day  ? midazolam  2 mg Intravenous Once  ? pantoprazole sodium  40 mg Per Tube Daily  ? polyethylene glycol  17 g Per Tube Daily  ? rocuronium bromide  100 mg Intravenous Once  ? sodium chloride flush  3 mL Intravenous Q12H  ? ?Continuous Infusions: ? sodium chloride Stopped (05/15/21 0751)  ? sodium chloride    ? sodium chloride    ? sodium chloride    ? amiodarone 30 mg/hr (05/15/21 0800)  ? ampicillin-sulbactam (UNASYN) IV 3 g (05/15/21 0930)  ? feeding supplement (VITAL AF 1.2 CAL) 1,000 mL (05/15/21 0215)  ? fentaNYL infusion INTRAVENOUS 200 mcg/hr (05/15/21 0800)  ? heparin 1,350 Units/hr (05/15/21 0800)  ? nitroGLYCERIN 5 mcg/min (05/13/21 1700)  ? norepinephrine (LEVOPHED) Adult infusion Stopped (05/14/21 0934)  ? potassium chloride 10 mEq (05/15/21 0935)  ? potassium chloride    ? ?PRN Meds: ?sodium chloride, acetaminophen, docusate, fentaNYL, labetalol, ondansetron (ZOFRAN) IV, polyethylene glycol, sodium chloride flush  ? ?Vital Signs  ?  ?Vitals:  ? 05/15/21 0700 05/15/21 0715 05/15/21 0753 05/15/21 0800  ?BP:    (!) 133/97  ?Pulse: 71 87 87 (!) 101  ?Resp: (!) 28 (!) 28 (!) 28 (!) 28  ?Temp:   98.7 ?F (37.1 ?C)    ?TempSrc:   Oral   ?SpO2: 98% 100% 100% 99%  ?Weight:      ?Height:      ? ? ?Intake/Output Summary (Last 24 hours) at 05/15/2021 1000 ?Last data filed at 05/15/2021 0800 ?Gross per 24 hour  ?Intake 2570.9 ml  ?Output 2320 ml  ?Net 250.9 ml  ? ? ?  05/15/2021  ?  5:02 AM 05/13/2021  ?  3:44 AM 05/12/2021  ?  5:21 AM  ?Last 3 Weights  ?Weight (lbs) 169 lb 5 oz 165 lb 12.6 oz 154 lb 15.7 oz  ?Weight (kg) 76.8 kg 75.2 kg 70.3 kg  ?   ? ?Telemetry  ?  ?Sinus rhythm- Personally Reviewed ? ?ECG  ?  ?05/13/2021 demonstrates sinus tachycardia at 147 bpm with interventricular conduction delay of left bundle morphology.- Personally Reviewed ? ?Physical Exam  ?Intubated and but awake and appropriate. ?No rub or gallop. ? ?Labs  ?  ?High Sensitivity Troponin:   ?Recent Labs  ?Lab 05/13/21 ?1452 05/13/21 ?1609 05/13/21 ?1748 05/14/21 ?7494 05/14/21 ?1036  ?TROPONINIHS 254* 641* 991* 563* 435*  ?   ?Chemistry ?Recent Labs  ?Lab 05/12/21 ?4967 05/12/21 ?5916 05/13/21 ?1452 05/13/21 ?1617 05/14/21 ?0411 05/14/21 ?0440 05/15/21 ?3846  ?NA 137   < > 137   < > 139 135 137  ?K 2.7*   < > 3.6   < > 3.5 3.3*  3.4*  ?CL 104   < > 107  --   --  105 103  ?CO2 16*   < > 19*  --   --  23 25  ?GLUCOSE 298*   < > 201*  --   --  101* 138*  ?BUN 16   < > 24*  --   --  19 15  ?CREATININE 1.39*   < > 1.21  --   --  1.03 0.95  ?CALCIUM 9.1   < > 8.8*  --   --  7.9* 8.6*  ?MG  --    < > 2.0  --   --  2.1 1.8  ?PROT 6.8  --   --   --   --   --   --   ?ALBUMIN 3.2*  --   --   --   --   --   --   ?AST 26  --   --   --   --   --   --   ?ALT 10  --   --   --   --   --   --   ?ALKPHOS 78  --   --   --   --   --   --   ?BILITOT 0.6  --   --   --   --   --   --   ?GFRNONAA 56*   < > >60  --   --  >60 >60  ?ANIONGAP 17*   < > 11  --   --  7 9  ? < > = values in this interval not displayed.  ?  ?Lipids  ?Recent Labs  ?Lab 05/13/21 ?0736  ?TRIG 128  ?  ?Hematology ?Recent Labs  ?Lab 05/13/21 ?0736 05/13/21 ?1617 05/14/21 ?0411 05/14/21 ?0440 05/15/21 ?2376  ?WBC  11.6*  --   --  13.1* 10.9*  ?RBC 4.48  --   --  4.24 4.38  ?HGB 14.0   < > 12.9* 13.2 13.8  ?HCT 41.4   < > 38.0* 38.1* 39.2  ?MCV 92.4  --   --  89.9 89.5  ?MCH 31.3  --   --  31.1 31.5  ?MCHC 33.8  --   --  34.6 35.2  ?RDW 13.0  --   --  12.9 13.0  ?PLT 245  --   --  244 237  ? < > = values in this interval not displayed.  ? ?Thyroid No results for input(s): TSH, FREET4 in the last 168 hours.  ?BNP ?Recent Labs  ?Lab 05/12/21 ?0527  ?BNP 742.2*  ?  ?DDimer No results for input(s): DDIMER in the last 168 hours.  ? ?Radiology  ?  ?DG CHEST PORT 1 VIEW ? ?Result Date: 05/13/2021 ?CLINICAL DATA:  Central line placement. EXAM: PORTABLE CHEST 1 VIEW COMPARISON:  May 13, 2021 (3:55 p.m.) FINDINGS: Since the prior study there is been interval placement of a left-sided venous catheter. Its distal tip sits within the superior vena cava, approximally 2.4 cm proximal to the junction of the superior vena cava and right atrium. Stable endotracheal tube and nasogastric tube positioning is noted. Stable diffuse bilateral airspace disease is seen. There is no evidence of a pleural effusion or pneumothorax. The heart size and mediastinal contours are within normal limits. Degenerative changes seen throughout the thoracic spine. IMPRESSION: Interval left-sided venous catheter placement and positioning, as described above, without additional significant interval changes. Electronically Signed   By:  Virgina Norfolk M.D.   On: 05/13/2021 19:42  ? ?DG Chest Port 1 View ? ?Result Date: 05/13/2021 ?CLINICAL DATA:  Encounter for intubation EXAM: PORTABLE CHEST 1 VIEW COMPARISON:  05/13/2021 FINDINGS: Endotracheal tube in good position. NG tube tip not visualized off the lower edge of the image. Progression of symmetric and diffuse bilateral airspace disease most likely pulmonary edema. Bibasilar atelectasis. Lung bases not included IMPRESSION: Endotracheal tube in good position. Interval development of diffuse bilateral airspace disease  consistent with pulmonary edema. Electronically Signed   By: Franchot Gallo M.D.   On: 05/13/2021 16:08  ? ?DG Abd Portable 1V ? ?Result Date: 05/13/2021 ?CLINICAL DATA:  Orogastric tube placement EXAM: PORTABLE ABDOMEN - 1 VIEW COMPARISON:  Portable exam 1556 hours bowel priors for comparison FINDINGS: Tip of orogastric tube projects over proximal to mid stomach. External pacing leads present. BILATERAL pulmonary infiltrates. Nonobstructive bowel gas pattern. IMPRESSION: Tip of orogastric tube projects over proximal to mid stomach. Electronically Signed   By: Lavonia Dana M.D.   On: 05/13/2021 16:07   ? ?Cardiac Studies  ? ?Echocardiographic LV EF is 65%. ? ?Patient Profile  ?   ?66 y.o. male admitted with acute respiratory failure and what appeared to be pulmonary edema, with history of paroxysmal atrial fibrillation, cryptogenic stroke, tobacco use, hypertension, hyperlipidemia, who developed supraventricular tachycardia that converted to wide complex tachycardia requiring electrical conversion urgently.  During the arrhythmia the patient decompensated from a respiratory standpoint and had to be reintubated. ? ?Assessment & Plan  ?  ?Elevated troponin in setting of acute pulmonary edema: Coronary angiography will be done with measurement of LVEDP to determine if there is an ischemic origin for the patient's clinical presentation and status. ?Acute respiratory failure with pulmonary infiltrates: Clinical suggestion is acute heart failure.  Rule out aspiration. ?Wide-complex tachycardia, possible VT versus SVT with aberration.  Cardiac cath being done to exclude ischemic substrate.  Continue IV amiodarone for the time being. ? ?The patient was counseled to undergo left heart catheterization, coronary angiography, and possible percutaneous coronary intervention with stent implantation. Mother and brother also present. The procedural risks and benefits were discussed in detail. The risks discussed included death,  stroke, myocardial infarction, life-threatening bleeding, limb ischemia, kidney injury, allergy, and possible emergency cardiac surgery. The risk of these significant complications were estimated to occur less t

## 2021-05-15 NOTE — Progress Notes (Addendum)
ANTICOAGULATION CONSULT NOTE ? ?Pharmacy Consult for heparin ?Indication: chest pain/ACS, afib ? ?Heparin Dosing Weight: 70.3 kg ? ?Labs: ?Recent Labs  ?  05/12/21 ?[id number] 05/12/21 ?[id number] 05/12/21 ?1155 05/12/21 ?1400 05/12/21 ?2224 05/13/21 ?[id number] 05/13/21 ?1452 05/13/21 ?1617 05/14/21 ?0411 05/14/21 ?[id number] 05/14/21 ?[id number] 05/14/21 ?1718 05/15/21 ?0135  ?HGB 16.7   < > 16.8  --   --  14.0  --  16.7 12.9* 13.2  --   --   --   ?HCT 51.8   < > 49.1  --   --  41.4  --  49.0 38.0* 38.1*  --   --   --   ?PLT 312  --  333  --   --  245  --   --   --  244  --   --   --   ?APTT 32  --   --   --  52*  --   --   --   --   --   --   --   --   ?LABPROT 12.6  --   --   --   --   --   --   --   --   --   --   --   --   ?INR 1.0  --   --   --   --   --   --   --   --   --   --   --   --   ?HEPARINUNFRC  --   --   --    < > 0.41 0.35  --   --   --   --  0.19* 0.23* 0.29*  ?CREATININE 1.39*   < > 1.43*   < >  --  1.24 1.21  --   --  1.03  --   --   --   ? < > = values in this interval not displayed.  ? ? ? ?Assessment: ?51 yom with hx of afib presenting with respiratory distress and pulmonary edema, found to have elevated troponin, trending up. Cardiology planning cath once extubated. Pharmacy consulted to dose heparin for ACS and hx afib. Xarelto listed on patient's PTA med profile, but patient reported when extubated he has not had his Xarelto in "at least 1 month." ? ?Heparin level low overnight: 0.29  CBC wnl. Renal function improving. No bleeding or issues with infusion per discussion with RN. ? ?Goal of Therapy:  ?Heparin level 0.3-0.7 units/ml ?Monitor platelets by anticoagulation protocol: Yes ?  ?Plan:  ?Increase heparin to 1350 units/hr ?Heparin level in 6 hours ?Monitor daily heparin level/CBC, s/sx bleeding ?F/u Cardiology plans for cath once extubated, resume Xarelto post-cath as appropriate ? ? ?Georga Bora, PharmD ?Clinical Pharmacist ?05/15/2021 2:37 AM ?Please check AMION for all [facility] numbers  ?

## 2021-05-15 NOTE — TOC Progression Note (Signed)
Transition of Care (TOC) - Initial/Assessment Note  ? ? ?Patient Details  ?Name: [name] ?MRN: [id number] ?Date of Birth: February 01, 1956 ? ?Transition of Care (TOC) CM/SW Contact:    ?Catalina Pizza Shawny Borkowski, LCSWA ?Phone Number: ?05/15/2021, 12:37 PM ? ?Clinical Narrative:                 ?Patient on vent and will undergo LEFT HEART CATH AND CORONARY ANGIOGRAPHY (N/A) as a surgical intervention. ? ?Transition of Care Department [facility]) has reviewed patient and no TOC needs have been identified at this time. We will continue to monitor patient advancement through interdisciplinary progression rounds. If new patient transition needs arise, please place a TOC consult. ?  ? ?  ?  ? ? ?Patient Goals and CMS Choice ?  ?  ?  ? ?Expected Discharge Plan and Services ?  ?  ?  ?  ?  ?                ?  ?  ?  ?  ?  ?  ?  ?  ?  ?  ? ?Prior Living Arrangements/Services ?  ?  ?  ?       ?  ?  ?  ?  ? ?Activities of Daily Living ?  ?  ? ?Permission Sought/Granted ?  ?  ?   ?   ?   ?   ? ?Emotional Assessment ?  ?  ?  ?  ?  ?  ? ?Admission diagnosis:  Acute pulmonary edema (HCC) [J81.0] ?Respiratory distress [R06.03] ?Patient Active Problem List  ? Diagnosis Date Noted  ? Acute pulmonary edema (HCC) 05/12/2021  ? Malnutrition of moderate degree 05/12/2021  ? Protrusion of lumbar intervertebral disc 12/10/2017  ? Chronic left-sided low back pain with left-sided sciatica 11/22/2017  ? Adhesive capsulitis of left shoulder 08/02/2017  ? Pain in left leg 08/02/2017  ? Gout 05/12/2017  ? Chronic left shoulder pain 06/13/2015  ? Neuropathic pain of shoulder 06/13/2015  ? Acromioclavicular joint arthritis 03/26/2015  ? PAF (paroxysmal atrial fibrillation) (HCC) 03/20/2015  ? Leukocytosis 03/20/2015  ? Hematemesis 03/19/2015  ? 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 Hemiparesis and alteration of sensations as late effects of stroke (HCC) 10/29/2014  ? Hypersomnia with sleep apnea 10/29/2014  ? Nicotine use disorder 10/29/2014  ? Left anterior  shoulder pain 10/23/2014  ? Atrial fibrillation (HCC) 09/06/2014  ? Left spastic hemiparesis (HCC) 08/07/2014  ? Left-sided neglect 08/07/2014  ? History of stroke 08/01/2014  ? Tobacco dependence   ? Carotid artery occlusion with infarction [facility])   ? History of ETOH abuse   ? Hyperlipidemia   ? Essential hypertension, benign 10/11/2013  ? ?PCP:  Porfirio Oar, PA ?Pharmacy:   ?[facility] - [place], [place] - [address] ?[address] ?[address] ?[place] [address] ?Phone: [phone] Fax: [phone] ? ?[facility] ?[address] ?[place] [address] ?Phone: [phone] Fax: [phone] ? ? ? ? ?Social Determinants of Health (SDOH) Interventions ?  ? ?Readmission Risk Interventions ?   ? View : No data to display.  ?  ?  ?  ? ? ? ?

## 2021-05-15 NOTE — Progress Notes (Signed)
RT note. ?Patient transported to cath lab and back on vent without any complications noted. RT will continue to monitor. ?

## 2021-05-15 NOTE — Progress Notes (Signed)
? ?Progress Note ? ?Patient Name: [name] ?Date of Encounter: 05/15/2021 ? ?[facility] Cardiologist: None Cristopher Peru MD ? ?Subjective  ? ?Remained intubated overnight. ? ?Attempt to wean from ventilator yesterday led to recurrent respiratory distress. ? ?After speaking with critical care, we have decided to proceed with cardiac catheterization with the patient intubated as it may be safer. ? ?Denies any chest pain during stay in hospital. ? ?Inpatient Medications  ?  ?Scheduled Meds: ? chlorhexidine gluconate (MEDLINE KIT)  15 mL Mouth Rinse BID  ? Chlorhexidine Gluconate Cloth  6 each Topical Daily  ? docusate  100 mg Per Tube BID  ? fentaNYL (SUBLIMAZE) injection  25 mcg Intravenous Once  ? insulin aspart  0-15 Units Subcutaneous Q4H  ? mouth rinse  15 mL Mouth Rinse 10 times per day  ? midazolam  2 mg Intravenous Once  ? pantoprazole sodium  40 mg Per Tube Daily  ? polyethylene glycol  17 g Per Tube Daily  ? rocuronium bromide  100 mg Intravenous Once  ? sodium chloride flush  3 mL Intravenous Q12H  ? ?Continuous Infusions: ? sodium chloride Stopped (05/15/21 0751)  ? sodium chloride    ? sodium chloride    ? sodium chloride    ? amiodarone 30 mg/hr (05/15/21 0800)  ? ampicillin-sulbactam (UNASYN) IV 3 g (05/15/21 0930)  ? feeding supplement (VITAL AF 1.2 CAL) 1,000 mL (05/15/21 0215)  ? fentaNYL infusion INTRAVENOUS 200 mcg/hr (05/15/21 0800)  ? heparin 1,350 Units/hr (05/15/21 0800)  ? nitroGLYCERIN 5 mcg/min (05/13/21 1700)  ? norepinephrine (LEVOPHED) Adult infusion Stopped (05/14/21 0934)  ? potassium chloride 10 mEq (05/15/21 0935)  ? potassium chloride    ? ?PRN Meds: ?sodium chloride, acetaminophen, docusate, fentaNYL, labetalol, ondansetron (ZOFRAN) IV, polyethylene glycol, sodium chloride flush  ? ?Vital Signs  ?  ?Vitals:  ? 05/15/21 0700 05/15/21 0715 05/15/21 0753 05/15/21 0800  ?BP:    (!) 133/97  ?Pulse: 71 87 87 (!) 101  ?Resp: (!) 28 (!) 28 (!) 28 (!) 28  ?Temp:   98.7 ?F (37.1 ?C)    ?TempSrc:   Oral   ?SpO2: 98% 100% 100% 99%  ?Weight:      ?Height:      ? ? ?Intake/Output Summary (Last 24 hours) at 05/15/2021 1000 ?Last data filed at 05/15/2021 0800 ?Gross per 24 hour  ?Intake 2570.9 ml  ?Output 2320 ml  ?Net 250.9 ml  ? ? ?  05/15/2021  ?  5:02 AM 05/13/2021  ?  3:44 AM 05/12/2021  ?  5:21 AM  ?Last 3 Weights  ?Weight (lbs) 169 lb 5 oz 165 lb 12.6 oz 154 lb 15.7 oz  ?Weight (kg) 76.8 kg 75.2 kg 70.3 kg  ?   ? ?Telemetry  ?  ?Sinus rhythm- Personally Reviewed ? ?ECG  ?  ?05/13/2021 demonstrates sinus tachycardia at 147 bpm with interventricular conduction delay of left bundle morphology.- Personally Reviewed ? ?Physical Exam  ?Intubated and but awake and appropriate. ?No rub or gallop. ? ?Labs  ?  ?High Sensitivity Troponin:   ?Recent Labs  ?Lab 05/13/21 ?1452 05/13/21 ?1609 05/13/21 ?1748 05/14/21 ?7494 05/14/21 ?1036  ?TROPONINIHS 254* 641* 991* 563* 435*  ?   ?Chemistry ?Recent Labs  ?Lab 05/12/21 ?4967 05/12/21 ?5916 05/13/21 ?1452 05/13/21 ?1617 05/14/21 ?0411 05/14/21 ?0440 05/15/21 ?3846  ?NA 137   < > 137   < > 139 135 137  ?K 2.7*   < > 3.6   < > 3.5 3.3*  3.4*  ?CL 104   < > 107  --   --  105 103  ?CO2 16*   < > 19*  --   --  23 25  ?GLUCOSE 298*   < > 201*  --   --  101* 138*  ?BUN 16   < > 24*  --   --  19 15  ?CREATININE 1.39*   < > 1.21  --   --  1.03 0.95  ?CALCIUM 9.1   < > 8.8*  --   --  7.9* 8.6*  ?MG  --    < > 2.0  --   --  2.1 1.8  ?PROT 6.8  --   --   --   --   --   --   ?ALBUMIN 3.2*  --   --   --   --   --   --   ?AST 26  --   --   --   --   --   --   ?ALT 10  --   --   --   --   --   --   ?ALKPHOS 78  --   --   --   --   --   --   ?BILITOT 0.6  --   --   --   --   --   --   ?GFRNONAA 56*   < > >60  --   --  >60 >60  ?ANIONGAP 17*   < > 11  --   --  7 9  ? < > = values in this interval not displayed.  ?  ?Lipids  ?Recent Labs  ?Lab 05/13/21 ?0736  ?TRIG 128  ?  ?Hematology ?Recent Labs  ?Lab 05/13/21 ?0736 05/13/21 ?1617 05/14/21 ?0411 05/14/21 ?0440 05/15/21 ?2376  ?WBC  11.6*  --   --  13.1* 10.9*  ?RBC 4.48  --   --  4.24 4.38  ?HGB 14.0   < > 12.9* 13.2 13.8  ?HCT 41.4   < > 38.0* 38.1* 39.2  ?MCV 92.4  --   --  89.9 89.5  ?MCH 31.3  --   --  31.1 31.5  ?MCHC 33.8  --   --  34.6 35.2  ?RDW 13.0  --   --  12.9 13.0  ?PLT 245  --   --  244 237  ? < > = values in this interval not displayed.  ? ?Thyroid No results for input(s): TSH, FREET4 in the last 168 hours.  ?BNP ?Recent Labs  ?Lab 05/12/21 ?0527  ?BNP 742.2*  ?  ?DDimer No results for input(s): DDIMER in the last 168 hours.  ? ?Radiology  ?  ?DG CHEST PORT 1 VIEW ? ?Result Date: 05/13/2021 ?CLINICAL DATA:  Central line placement. EXAM: PORTABLE CHEST 1 VIEW COMPARISON:  May 13, 2021 (3:55 p.m.) FINDINGS: Since the prior study there is been interval placement of a left-sided venous catheter. Its distal tip sits within the superior vena cava, approximally 2.4 cm proximal to the junction of the superior vena cava and right atrium. Stable endotracheal tube and nasogastric tube positioning is noted. Stable diffuse bilateral airspace disease is seen. There is no evidence of a pleural effusion or pneumothorax. The heart size and mediastinal contours are within normal limits. Degenerative changes seen throughout the thoracic spine. IMPRESSION: Interval left-sided venous catheter placement and positioning, as described above, without additional significant interval changes. Electronically Signed   By:  Virgina Norfolk M.D.   On: 05/13/2021 19:42  ? ?DG Chest Port 1 View ? ?Result Date: 05/13/2021 ?CLINICAL DATA:  Encounter for intubation EXAM: PORTABLE CHEST 1 VIEW COMPARISON:  05/13/2021 FINDINGS: Endotracheal tube in good position. NG tube tip not visualized off the lower edge of the image. Progression of symmetric and diffuse bilateral airspace disease most likely pulmonary edema. Bibasilar atelectasis. Lung bases not included IMPRESSION: Endotracheal tube in good position. Interval development of diffuse bilateral airspace disease  consistent with pulmonary edema. Electronically Signed   By: Franchot Gallo M.D.   On: 05/13/2021 16:08  ? ?DG Abd Portable 1V ? ?Result Date: 05/13/2021 ?CLINICAL DATA:  Orogastric tube placement EXAM: PORTABLE ABDOMEN - 1 VIEW COMPARISON:  Portable exam 1556 hours bowel priors for comparison FINDINGS: Tip of orogastric tube projects over proximal to mid stomach. External pacing leads present. BILATERAL pulmonary infiltrates. Nonobstructive bowel gas pattern. IMPRESSION: Tip of orogastric tube projects over proximal to mid stomach. Electronically Signed   By: Lavonia Dana M.D.   On: 05/13/2021 16:07   ? ?Cardiac Studies  ? ?Echocardiographic LV EF is 65%. ? ?Patient Profile  ?   ?66 y.o. male admitted with acute respiratory failure and what appeared to be pulmonary edema, with history of paroxysmal atrial fibrillation, cryptogenic stroke, tobacco use, hypertension, hyperlipidemia, who developed supraventricular tachycardia that converted to wide complex tachycardia requiring electrical conversion urgently.  During the arrhythmia the patient decompensated from a respiratory standpoint and had to be reintubated. ? ?Assessment & Plan  ?  ?Elevated troponin in setting of acute pulmonary edema: Coronary angiography will be done with measurement of LVEDP to determine if there is an ischemic origin for the patient's clinical presentation and status. ?Acute respiratory failure with pulmonary infiltrates: Clinical suggestion is acute heart failure.  Rule out aspiration. ?Wide-complex tachycardia, possible VT versus SVT with aberration.  Cardiac cath being done to exclude ischemic substrate.  Continue IV amiodarone for the time being. ? ?The patient was counseled to undergo left heart catheterization, coronary angiography, and possible percutaneous coronary intervention with stent implantation. Mother and brother also present. The procedural risks and benefits were discussed in detail. The risks discussed included death,  stroke, myocardial infarction, life-threatening bleeding, limb ischemia, kidney injury, allergy, and possible emergency cardiac surgery. The risk of these significant complications were estimated to occur less t

## 2021-05-15 NOTE — Progress Notes (Signed)
? ?NAME:  [name], MRN:  [id number], DOB:  [birth year], LOS: 3 ?ADMISSION DATE:  05/12/2021, CONSULTATION DATE:  05/12/21 ?REFERRING MD:  EDP, CHIEF COMPLAINT:  SOB  ? ?History of Present Illness:  ?66 yo with pmh R MCA cva in 2016, R occluded ICA, h/o afib  presented in resp distress via ems. All history obtained from edp and chart review as pt is intubated and sedated. Pt was noted to have called ems 2/2 resp distress, sudden onset. Upon arrival emergency found oxygen saturations to be in 30's on RA. Was attempted on cpap but did not tolerate this and arrived to hospital on no supplemental oxygen. He was immediately placed on NRB with increase in sats to 98%.  Pt is tachycardic and hypertensive. Cxr revealed bilateral airspace disease suspicious for acute pulm edema.  ? ?Pertinent  Medical History  ?As above ? ?Significant Hospital Events: ?Including procedures, antibiotic start and stop dates in addition to other pertinent events   ?3/27: intubated and admitted to [facility] ?3/28 Seen alert and interactive on vent ?3/29 Interactive and able to follow commands on vent, no arrhythmias overnight  ?3/30 plan for LHC  ? ?Interim History / Subjective:  ?NAEO ? ?Tolerated brief SBT this morning. HR did incr to 90-110s transiently but returned to 60-80  ? ?Objective   ?Blood pressure (!) 133/97, pulse (!) 101, temperature 98.7 ?F (37.1 ?C), temperature source Oral, resp. rate (!) 28, height 5\' 5"  (1.651 m), weight 76.8 kg, SpO2 99 %. ?   ?Vent Mode: PRVC ?FiO2 (%):  [40 %] 40 % ?Set Rate:  [28 bmp] 28 bmp ?Vt Set:  [500 mL] 500 mL ?PEEP:  [5 cmH20-8 cmH20] 8 cmH20 ?Pressure Support:  [5 cmH20] 5 cmH20 ?Plateau Pressure:  [20 [id number] cmH20] 22 cmH20  ? ?Intake/Output Summary (Last 24 hours) at 05/15/2021 0849 ?Last data filed at 05/15/2021 0700 ?Gross per 24 hour  ?Intake 2644.01 ml  ?Output 2630 ml  ?Net 14.01 ml  ? ?Filed Weights  ? 05/12/21 0521 05/13/21 0344 05/15/21 0502  ?Weight: 70.3 kg 75.2 kg 76.8 kg  ? ? ?Examination:   ?General: Critically ill appearing older adult M intubated lightly sedated NAD ?HEENT: NCAT pink mm anicteric sclera ETT secure ?Neuro: Awake, following commands. PERRL  ?CV: irir s1s2 cap refill < 3 sec  ?PULM:  Even unlabored, mechanically ventilated. Basilar crackles  ?GI: normoactive x 4 soft ndnt  ?Extremities: no acute joint deformity no cyanosis or clubbing ?Skin: c/d/w  ? ?Resolved Hospital Problem list   ?HTN emergency ?Mild AKI ? ?Assessment & Plan:  ? ?Acute hypoxic respiratory failure requiring reintubation ?Suspected aspiration event  ?-flash pulm edema 2/2 HTN urgency  ?-Unfortunately patient was reintubated afternoon of 3/28 after witnessed transition to V-tach (never arrested)  ?P: ?-Cont MV as able  ?-unasyn ?-additional 60mg  Lasix 3/30  ?-WUA/SBT after LHC 3/30  ? ?Wide complex tachycardia / VT, sp DCCV ?Hx Afib  ?Hx HTN (HTN urgency this admission with Presenting SBP 190s, quickly corrected to SBP 80-90)  ?Hx HLD  ?P ?-going for [facility] 3/30  ?-hep gtt ?-amio gtt  ?-K > 4, Mag > 2  ? ?Hx R MCA CVA ?P: ?-supportive care  ? ?Hypokalemia ?Low-normal mag  ?P:  ?-trend lytes, replace as needed  ?-goal K > 4, Mag > 2  ? ?Best Practice (right click and "Reselect all SmartList Selections" daily)  ? ?Diet/type: NPO ?DVT prophylaxis: systemic heparin ?GI prophylaxis: PPI ?Lines: Central line and Arterial Line ?Foley:  Yes, and it is still needed ?Code Status:  full code ?Last date of multidisciplinary goals of care discussion: Pending contact with family ? ?CRITICAL CARE ?Performed by: Cristal Generous ? ? ?Total critical care time: 36 minutes ? ?Critical care time was exclusive of separately billable procedures and treating other patients. ?Critical care was necessary to treat or prevent imminent or life-threatening deterioration. ? ?Critical care was time spent personally by me on the following activities: development of treatment plan with patient and/or surrogate as well as nursing, discussions with  consultants, evaluation of patient's response to treatment, examination of patient, obtaining history from patient or surrogate, ordering and performing treatments and interventions, ordering and review of laboratory studies, ordering and review of radiographic studies, pulse oximetry and re-evaluation of patient's condition. ? ?Eliseo Gum MSN, AGACNP-BC ?[facility] ?Amion for pager  ?05/15/2021, 8:49 AM ? ? ? ? ? ?  ?

## 2021-05-15 NOTE — Progress Notes (Signed)
Seen in follow up this afternoon after heart cath. ?Have been progressively weaning sedation this afternoon. ? ?Has tolerated SBT for alst 30+ minutes.  ?Did go back into Afib when asked to cough.  ? ?Plan: ?-Extubate ?-Pulm hygiene ?-IS ?-Supplemental O2 as needed ?-Amio bolus, cont gtt  ? ? ? ?Tessie Fass MSN, AGACNP-BC ?[facility] ?Amion for pager  ?05/15/2021, 4:21 PM ? ?

## 2021-05-15 NOTE — Interval H&P Note (Signed)
Cath Lab Visit (complete for each Cath Lab visit) ? ?Clinical Evaluation Leading to the Procedure:  ? ?ACS: Yes.   ? ?Non-ACS:   ? ?Anginal Classification: CCS IV ? ?Anti-ischemic medical therapy: Minimal Therapy (1 class of medications) ? ?Non-Invasive Test Results: No non-invasive testing performed ? ?Prior CABG: No previous CABG ? ? ? ? ? ?History and Physical Interval Note: ? ?05/15/2021 ?11:14 AM ? ?[name]  has presented today for surgery, with the diagnosis of chest pain.  The various methods of treatment have been discussed with the patient and family. After consideration of risks, benefits and other options for treatment, the patient has consented to  Procedure(s): ?LEFT HEART CATH AND CORONARY ANGIOGRAPHY (N/A) as a surgical intervention.  The patient's history has been reviewed, patient examined, no change in status, stable for surgery.  I have reviewed the patient's chart and labs.  Questions were answered to the patient's satisfaction.   ? ? ?Lance Muss ? ? ?

## 2021-05-16 ENCOUNTER — Other Ambulatory Visit (HOSPITAL_COMMUNITY): Payer: Self-pay

## 2021-05-16 ENCOUNTER — Encounter (HOSPITAL_COMMUNITY): Payer: Self-pay | Admitting: Critical Care Medicine

## 2021-05-16 DIAGNOSIS — R0603 Acute respiratory distress: Principal | ICD-10-CM

## 2021-05-16 DIAGNOSIS — I4891 Unspecified atrial fibrillation: Secondary | ICD-10-CM

## 2021-05-16 DIAGNOSIS — I472 Ventricular tachycardia, unspecified: Secondary | ICD-10-CM

## 2021-05-16 DIAGNOSIS — E440 Moderate protein-calorie malnutrition: Secondary | ICD-10-CM | POA: Diagnosis not present

## 2021-05-16 DIAGNOSIS — I4892 Unspecified atrial flutter: Secondary | ICD-10-CM

## 2021-05-16 DIAGNOSIS — J9602 Acute respiratory failure with hypercapnia: Secondary | ICD-10-CM | POA: Diagnosis not present

## 2021-05-16 DIAGNOSIS — J9601 Acute respiratory failure with hypoxia: Secondary | ICD-10-CM | POA: Diagnosis not present

## 2021-05-16 DIAGNOSIS — R000 Tachycardia, unspecified: Secondary | ICD-10-CM

## 2021-05-16 DIAGNOSIS — J810 Acute pulmonary edema: Secondary | ICD-10-CM | POA: Diagnosis not present

## 2021-05-16 LAB — CBC
HCT: 39 % (ref 39.0–52.0)
Hemoglobin: 13.2 g/dL (ref 13.0–17.0)
MCH: 31.4 pg (ref 26.0–34.0)
MCHC: 33.8 g/dL (ref 30.0–36.0)
MCV: 92.6 fL (ref 80.0–100.0)
Platelets: 248 10*3/uL (ref 150–400)
RBC: 4.21 MIL/uL — ABNORMAL LOW (ref 4.22–5.81)
RDW: 13.2 % (ref 11.5–15.5)
WBC: 13.4 10*3/uL — ABNORMAL HIGH (ref 4.0–10.5)
nRBC: 0 % (ref 0.0–0.2)

## 2021-05-16 LAB — GLUCOSE, CAPILLARY
Glucose-Capillary: 93 mg/dL (ref 70–99)
Glucose-Capillary: 112 mg/dL — ABNORMAL HIGH (ref 70–99)
Glucose-Capillary: 108 mg/dL — ABNORMAL HIGH (ref 70–99)
Glucose-Capillary: 111 mg/dL — ABNORMAL HIGH (ref 70–99)

## 2021-05-16 LAB — HEPARIN LEVEL (UNFRACTIONATED): Heparin Unfractionated: 0.27 IU/mL — ABNORMAL LOW (ref 0.30–0.70)

## 2021-05-16 LAB — BASIC METABOLIC PANEL
Anion gap: 8 (ref 5–15)
BUN: 16 mg/dL (ref 8–23)
CO2: 24 mmol/L (ref 22–32)
Calcium: 8.9 mg/dL (ref 8.9–10.3)
Chloride: 101 mmol/L (ref 98–111)
Creatinine, Ser: 1 mg/dL (ref 0.61–1.24)
GFR, Estimated: >60 mL/min (ref >60)
Glucose, Bld: 106 mg/dL — ABNORMAL HIGH (ref 70–99)
Potassium: 4.3 mmol/L (ref 3.5–5.1)
Sodium: 133 mmol/L — ABNORMAL LOW (ref 135–145)

## 2021-05-16 LAB — RAPID URINE DRUG SCREEN, HOSP PERFORMED
Amphetamines: NOT DETECTED
Barbiturates: NOT DETECTED
Benzodiazepines: POSITIVE — AB
Cocaine: NOT DETECTED
Opiates: NOT DETECTED
Tetrahydrocannabinol: POSITIVE — AB

## 2021-05-16 LAB — MAGNESIUM: Magnesium: 1.9 mg/dL (ref 1.7–2.4)

## 2021-05-16 MED ORDER — MAGNESIUM SULFATE IN D5W 1-5 GM/100ML-% IV SOLN
1.0000 g | Freq: Once | INTRAVENOUS | Status: AC
  Administered 2021-05-16: 1 g via INTRAVENOUS
  Filled 2021-05-16 (×2): qty 100

## 2021-05-16 MED ORDER — AMIODARONE HCL 200 MG PO TABS
200.0000 mg | ORAL_TABLET | Freq: Two times a day (BID) | ORAL | Status: DC
  Administered 2021-05-16: 200 mg via ORAL
  Filled 2021-05-16: qty 1

## 2021-05-16 MED ORDER — METOPROLOL SUCCINATE ER 25 MG PO TB24: 25.0000 mg | ORAL_TABLET | Freq: Every day | ORAL | Status: DC

## 2021-05-16 MED ORDER — NICOTINE 21 MG/24HR TD PT24
21.0000 mg | MEDICATED_PATCH | Freq: Every day | TRANSDERMAL | Status: DC
  Administered 2021-05-16 – 2021-05-19 (×4): 21 mg via TRANSDERMAL
  Filled 2021-05-16 (×4): qty 1

## 2021-05-16 MED ORDER — AMIODARONE HCL 200 MG PO TABS: 200.0000 mg | ORAL_TABLET | Freq: Two times a day (BID) | ORAL | Status: DC

## 2021-05-16 MED ORDER — MAGNESIUM OXIDE -MG SUPPLEMENT 400 (240 MG) MG PO TABS: 400.0000 mg | ORAL_TABLET | Freq: Every day | ORAL | Status: DC

## 2021-05-16 MED ORDER — METOPROLOL TARTRATE 25 MG PO TABS
25.0000 mg | ORAL_TABLET | Freq: Three times a day (TID) | ORAL | Status: DC
  Administered 2021-05-16 – 2021-05-17 (×3): 25 mg via ORAL
  Filled 2021-05-16 (×3): qty 1

## 2021-05-16 MED ORDER — ADULT MULTIVITAMIN W/MINERALS CH
1.0000 | ORAL_TABLET | Freq: Every day | ORAL | Status: DC
  Administered 2021-05-16 – 2021-05-19 (×4): 1 via ORAL
  Filled 2021-05-16 (×4): qty 1

## 2021-05-16 MED ORDER — AMIODARONE HCL 200 MG PO TABS
400.0000 mg | ORAL_TABLET | Freq: Two times a day (BID) | ORAL | Status: DC
  Administered 2021-05-16 – 2021-05-18 (×4): 400 mg via ORAL
  Filled 2021-05-16 (×4): qty 2

## 2021-05-16 MED ORDER — INSULIN ASPART 100 UNIT/ML IJ SOLN: 0.0000 [IU] | Freq: Every day | INTRAMUSCULAR | Status: DC

## 2021-05-16 MED ORDER — RIVAROXABAN 20 MG PO TABS
20.0000 mg | ORAL_TABLET | Freq: Every day | ORAL | Status: DC
  Administered 2021-05-16 – 2021-05-18 (×3): 20 mg via ORAL
  Filled 2021-05-16 (×3): qty 1

## 2021-05-16 MED ORDER — ENSURE ENLIVE PO LIQD
237.0000 mL | Freq: Three times a day (TID) | ORAL | Status: DC
  Administered 2021-05-16: 237 mL via ORAL

## 2021-05-16 MED ORDER — AMIODARONE HCL 200 MG PO TABS: 200.0000 mg | ORAL_TABLET | Freq: Every day | ORAL | Status: DC

## 2021-05-16 MED ORDER — MAGNESIUM OXIDE -MG SUPPLEMENT 400 (240 MG) MG PO TABS
400.0000 mg | ORAL_TABLET | Freq: Every day | ORAL | Status: DC
  Administered 2021-05-17 – 2021-05-19 (×3): 400 mg via ORAL
  Filled 2021-05-16 (×3): qty 1

## 2021-05-16 MED ORDER — INSULIN ASPART 100 UNIT/ML IJ SOLN
0.0000 [IU] | Freq: Three times a day (TID) | INTRAMUSCULAR | Status: DC
  Administered 2021-05-17 – 2021-05-19 (×2): 2 [IU] via SUBCUTANEOUS

## 2021-05-16 MED ORDER — DOCUSATE SODIUM 50 MG/5ML PO LIQD
100.0000 mg | Freq: Two times a day (BID) | ORAL | Status: DC | PRN
  Filled 2021-05-16: qty 10

## 2021-05-16 NOTE — Progress Notes (Signed)
Foley catheter removed per urinary catheter removal protocol ?

## 2021-05-16 NOTE — Plan of Care (Signed)
  Problem: Clinical Measurements: Goal: Respiratory complications will improve Outcome: Progressing   

## 2021-05-16 NOTE — Plan of Care (Addendum)
  Problem: Clinical Measurements: Goal: Diagnostic test results will improve Outcome: Progressing Goal: Respiratory complications will improve Outcome: Progressing Goal: Cardiovascular complication will be avoided Outcome: Progressing   

## 2021-05-16 NOTE — Progress Notes (Signed)
ANTICOAGULATION CONSULT NOTE ? ?Pharmacy Consult for heparin ?Indication: chest pain/ACS, afib ? ?Heparin Dosing Weight: 70.3 kg ? ?Labs: ?Recent Labs  ?  05/14/21 ?0440 05/14/21 ?0836 05/15/21 ?7124 05/15/21 ?5809 05/16/21 ?0532  ?HGB 13.2  --  13.8  --  13.2  ?HCT 38.1*  --  39.2  --  39.0  ?PLT 244  --  237  --  248  ?HEPARINUNFRC  --    < > 0.33 0.43 0.27*  ?CREATININE 1.03  --  0.95  --  1.00  ? < > = values in this interval not displayed.  ? ? ? ?Assessment: ?58 yom with hx of afib presenting with respiratory distress and pulmonary edema, found to have elevated troponin, trending up. Cardiology planning cath once extubated. Pharmacy consulted to dose heparin for ACS and hx afib. Xarelto listed on patient's PTA med profile, but patient reported when extubated he has not had his Xarelto in "at least 1 month." ? ?Switch back to home Xarelto ? ?Goal of Therapy:  ?Heparin level 0.3-0.7 units/ml ?Monitor platelets by anticoagulation protocol: Yes ?  ?Plan:  ?D/c  heparin with first dose of Xarelto ?Start Xarelto 20 mg po daily ?Monitor CBC, s/sx bleeding ? ?Jeanella Cara, PharmD, FCCM ?Clinical Pharmacist ?Please see AMION for all Pharmacists' Contact Phone Numbers ?05/16/2021, 8:51 AM  ? ? ?

## 2021-05-16 NOTE — Evaluation (Addendum)
Physical Therapy Re-Evaluation and Treatment ?Patient Details ?Name: [name] ?MRN: [id number] ?DOB: May 12, 1955 ?Today's Date: 05/16/2021 ? ?History of Present Illness ? Pt adm 3/27 with acute hypoxic respiratory failure and acute pulmonary edema. Intubated in ED and extubated 3/28.  PMH - afib, htn, rt CVA  ?Clinical Impression ? Pt extubated yesterday and pt eager to get OOB. Pt continues to be impulsive and needs redirection for safety, standing quickly after getting to EoB, requiring multimodal cues to sit until lines and leads could be safely organized. Pt exacerbated historical weakness, and lack of coordination and resultant decrease in balance especially dynamically. Pt require min-modA for transfers and ambulation due to his deficits. Prior goals remain appropriate at this time. PT recommending HHPT and RW at discharge however pt reports he will refuse. PT will continue to follow him acutely.   ?   ? ?Recommendations for follow up therapy are one component of a multi-disciplinary discharge planning process, led by the attending physician.  Recommendations may be updated based on patient status, additional functional criteria and insurance authorization. ? ?Follow Up Recommendations Home health PT (however pt refuses) ? ?  ?Assistance Recommended at Discharge Intermittent Supervision/Assistance  ?Patient can return home with the following ? A little help with walking and/or transfers;A little help with bathing/dressing/bathroom;Assistance with cooking/housework;Assistance with feeding;Direct supervision/assist for medications management;Direct supervision/assist for financial management;Assist for transportation;Help with stairs or ramp for entrance ? ?  ?Equipment Recommendations Rolling walker (2 wheels) (recommended but pt refuses)  ?Recommendations for Other Services ?    ?  ?Functional Status Assessment Patient has had a recent decline in their functional status and demonstrates the ability to make  significant improvements in function in a reasonable and predictable amount of time.  ? ?  ?Precautions / Restrictions Precautions ?Precautions: Fall ?Precaution Comments: monitor O2 ?Restrictions ?Weight Bearing Restrictions: No  ? ?  ? ?Mobility ? Bed Mobility ?Overal bed mobility: Needs Assistance ?Bed Mobility: Supine to Sit ?  ?  ?Supine to sit: Supervision ?  ?  ?General bed mobility comments: Assist for line management and safety, moves impulsively and has to be told to sit back down for line management when he immediately stands up once he gets to EOB ?  ? ?Transfers ?Overall transfer level: Needs assistance ?Equipment used: None ?Transfers: Sit to/from Stand, Bed to chair/wheelchair/BSC ?Sit to Stand: Min assist, Mod assist ?  ?  ?  ?  ?  ?General transfer comment: min A for steadying after immediately standing after coming to EoB, modA for steadying when ready to stand due to dizziness which he reports as his baseline, min A for 3rd attempt with RW to steady ?  ? ?Ambulation/Gait ?Ambulation/Gait assistance: Min guard, Min assist ?Gait Distance (Feet): 150 Feet ?Assistive device: Rolling walker (2 wheels) ?Gait Pattern/deviations: Step-through pattern, Decreased stride length, Ataxic, Decreased weight shift to left ?Gait velocity: decr ?Gait velocity interpretation: <1.8 ft/sec, indicate of risk for recurrent falls ?  ?General Gait Details: min A for navigation around obstacles and making turns, min guard for straight line ambulation ? ?  ? ?  ? ?Balance Overall balance assessment: Needs assistance ?Sitting-balance support: Feet supported ?Sitting balance-Leahy Scale: Fair ?  ?  ?Standing balance support: During functional activity, Reliant on assistive device for balance, Bilateral upper extremity supported, Single extremity supported ?Standing balance-Leahy Scale: Poor ?Standing balance comment: requires at least single UE support for steadying ?  ?  ?  ?  ?  ?  ?  ?  ?  ?  ?  ?   ? ? ? ?  Pertinent  Vitals/Pain Pain Assessment ?Pain Assessment: No/denies pain  ? ? ?Home Living Family/patient expects to be discharged to:: Private residence ?Living Arrangements: Children;Other relatives (daughter and her family) ?Available Help at Discharge: Family;Available PRN/intermittently (most of the time) ?Type of Home: House ?Home Access: Stairs to enter ?Entrance Stairs-Rails: Right ?Entrance Stairs-Number of Steps: 2-3 ?  ?Home Layout: One level ?Home Equipment: Conservation officer, nature (2 wheels) ?   ?  ?Prior Function Prior Level of Function : Independent/Modified Independent;Driving ?  ?  ?  ?  ?  ?  ?Mobility Comments: doesn't use assistive device ?ADLs Comments: Independent with ADLs, driving; does not do IADLs. endorses one recent fall off the porch that involved EtoH ?  ? ? ?Hand Dominance  ? Dominant Hand: Right ? ?  ?Extremity/Trunk Assessment  ? Upper Extremity Assessment ?Upper Extremity Assessment: Defer to OT evaluation ?  ? ?Lower Extremity Assessment ?Lower Extremity Assessment: LLE deficits/detail ?LLE Deficits / Details: residual CVA weakness, decreased coordination, and decreased sensation ?LLE Sensation: decreased light touch;decreased proprioception ?LLE Coordination: decreased fine motor ?  ? ?Cervical / Trunk Assessment ?Cervical / Trunk Assessment: Normal  ?Communication  ? Communication: No difficulties  ?Cognition Arousal/Alertness: Awake/alert ?Behavior During Therapy: Restless, Impulsive ?Overall Cognitive Status: No family/caregiver present to determine baseline cognitive functioning ?Area of Impairment: Safety/judgement ?  ?  ?  ?  ?  ?  ?  ?  ?  ?  ?  ?  ?Safety/Judgement: Decreased awareness of safety ?  ?  ?General Comments: likely baseline ?  ?  ? ?  ?General Comments General comments (skin integrity, edema, etc.): [place] out of nares on entry SpO2 85%O2, replaced [place] on 5L and SpO2 98%O2, decreased to 3L O2 and pt able to maintain SpO2 >90%O2 with ambulation ? ?  ?   ? ?Assessment/Plan  ?  ?PT  Assessment Patient needs continued PT services  ?PT Problem List Decreased mobility;Decreased strength;Decreased safety awareness ? ?   ?  ?PT Treatment Interventions DME instruction;Gait training;Stair training;Functional mobility training;Therapeutic activities;Therapeutic exercise;Patient/family education   ? ?PT Goals (Current goals can be found in the Care Plan section)  ?Acute Rehab PT Goals ?Patient Stated Goal: go home ?PT Goal Formulation: With patient ?Time For Goal Achievement: 05/20/21 ?Potential to Achieve Goals: Good ? ?  ?Frequency Min 3X/week ?  ? ? ?   ?AM-PAC PT "6 Clicks" Mobility  ?Outcome Measure Help needed turning from your back to your side while in a flat bed without using bedrails?: None ?Help needed moving from lying on your back to sitting on the side of a flat bed without using bedrails?: None ?Help needed moving to and from a bed to a chair (including a wheelchair)?: A Little ?Help needed standing up from a chair using your arms (e.g., wheelchair or bedside chair)?: A Little ?Help needed to walk in hospital room?: A Little ?Help needed climbing 3-5 steps with a railing? : A Little ?6 Click Score: 20 ? ?  ?End of Session Equipment Utilized During Treatment: Oxygen ?Activity Tolerance: Patient tolerated treatment well ?Patient left: in chair;with call bell/phone within reach;with chair alarm set ?Nurse Communication: Mobility status ?PT Visit Diagnosis: Other abnormalities of gait and mobility (R26.89);Muscle weakness (generalized) (M62.81) ?  ? ?Time: [id number] ?PT Time Calculation (min) (ACUTE ONLY): 37 min ? ? ?Charges:   PT Evaluation ?$PT Re-evaluation: 1 Re-eval ?PT Treatments ?$Gait Training: 8-22 mins ?  ?   ? ? ?Omayra Tulloch B. Migdalia Dk PT, DPT ?Acute Rehabilitation Services ?  Pager [phone] ?Office [phone] ? ? ?[place] ?05/16/2021, 10:07 AM ? ?

## 2021-05-16 NOTE — Progress Notes (Signed)
ANTICOAGULATION CONSULT NOTE ? ?Pharmacy Consult for heparin ?Indication: chest pain/ACS, afib ? ?Heparin Dosing Weight: 70.3 kg ? ?Labs: ?Recent Labs  ?  05/13/21 ?1452 05/13/21 ?1617 05/14/21 ?0440 05/14/21 ?0836 05/15/21 ?[id number] 05/15/21 ?[id number] 05/16/21 ?0532  ?HGB  --    < > 13.2  --  13.8  --  13.2  ?HCT  --    < > 38.1*  --  39.2  --  39.0  ?PLT  --   --  244  --  237  --  248  ?HEPARINUNFRC  --   --   --    < > 0.33 0.43 0.27*  ?CREATININE 1.21  --  1.03  --  0.95  --   --   ? < > = values in this interval not displayed.  ? ? ? ?Assessment: ?16 yom with hx of afib presenting with respiratory distress and pulmonary edema, found to have elevated troponin, trending up. Cardiology planning cath once extubated. Pharmacy consulted to dose heparin for ACS and hx afib. Xarelto listed on patient's PTA med profile, but patient reported when extubated he has not had his Xarelto in "at least 1 month." ? ?Heparin level subtherapeutic: 0.27  CBC wnl. Renal function improving. No bleeding or issues with infusion per discussion with RN. ? ?Goal of Therapy:  ?Heparin level 0.3-0.7 units/ml ?Monitor platelets by anticoagulation protocol: Yes ?  ?Plan:  ?Increase heparin to 1450 units/hr ?Monitor daily heparin level/CBC, s/sx bleeding ?F/u Cardiology plans after cath today ?F/u plans to resume Xarelto post-cath as appropriate ? ?Georga Bora, PharmD ?Clinical Pharmacist ?05/16/2021 6:14 AM ?Please check AMION for all [place] numbers  ? ?

## 2021-05-16 NOTE — Evaluation (Signed)
Occupational Therapy Re-Evaluation ?Patient Details ?Name: [name] ?MRN: [id number] ?DOB: [birth year] ?Today's Date: 05/16/2021 ? ? ?History of Present Illness Pt adm 3/27 with acute hypoxic respiratory failure and acute pulmonary edema. Intubated in ED and extubated 3/28.  S/P LHC 3/30 and extubated again. PMH - afib, htn, rt CVA  ? ?Clinical Impression ?  ?Patient re-evaluated since extubation yesterday. He completes bed mobility with supervision, LB ADLs with min assist. Patient engaged in short blessed test with deficits in STM, sequencing, attention and problem solving- scoring 14/28 (significant impairment). Question baseline cognition, but pt reports driving and taking care of his own medications.  Limited session today due to fatigue, after EOB activities pt requests to lay back down to rest.  Will follow acutely with follow up [facility] services at dc.  ?   ? ?Recommendations for follow up therapy are one component of a multi-disciplinary discharge planning process, led by the attending physician.  Recommendations may be updated based on patient status, additional functional criteria and insurance authorization.  ? ?Follow Up Recommendations ? Home health OT  ?  ?Assistance Recommended at Discharge Intermittent Supervision/Assistance  ?Patient can return home with the following Assistance with cooking/housework;Assist for transportation;Help with stairs or ramp for entrance;Direct supervision/assist for medications management;Direct supervision/assist for financial management;A little help with walking and/or transfers;A little help with bathing/dressing/bathroom ? ?  ?Functional Status Assessment ? Patient has had a recent decline in their functional status and demonstrates the ability to make significant improvements in function in a reasonable and predictable amount of time.  ?Equipment Recommendations ? BSC/3in1  ?  ?Recommendations for Other Services   ? ? ?  ?Precautions / Restrictions  Precautions ?Precautions: Fall ?Precaution Comments: monitor O2 ?Restrictions ?Weight Bearing Restrictions: No  ? ?  ? ?Mobility Bed Mobility ?Overal bed mobility: Needs Assistance ?Bed Mobility: Sidelying to Sit ?  ?Sidelying to sit: Supervision ?  ?  ?  ?General bed mobility comments: for safety, no assist required ?  ? ?Transfers ?  ?  ?  ?  ?  ?  ?  ?  ?  ?  ?  ? ?  ?Balance Overall balance assessment: Needs assistance ?Sitting-balance support: Feet supported ?Sitting balance-Leahy Scale: Fair ?  ?  ?  ?  ?  ?  ?  ?  ?  ?  ?  ?  ?  ?  ?  ?  ?   ? ?ADL either performed or assessed with clinical judgement  ? ?ADL Overall ADL's : Needs assistance/impaired ?  ?  ?Grooming: Set up;Sitting ?  ?  ?  ?  ?  ?  ?  ?Lower Body Dressing: Minimal assistance;Sitting/lateral leans ?Lower Body Dressing Details (indicate cue type and reason): to manage socks ?  ?Toilet Transfer Details (indicate cue type and reason): pt declined, due to fatigue ?  ?  ?  ?  ?Functional mobility during ADLs: Supervision/safety (limited to EOB, pt requests to lay back down) ?General ADL Comments: on 3L O2 during session  ? ? ? ?Vision   ?Vision Assessment?: No apparent visual deficits  ?   ?Perception   ?  ?Praxis   ?  ? ?Pertinent Vitals/Pain Pain Assessment ?Pain Assessment: No/denies pain  ? ? ? ?Hand Dominance Right ?  ?Extremity/Trunk Assessment Upper Extremity Assessment ?Upper Extremity Assessment: LUE deficits/detail ?LUE Deficits / Details: hx of CVA with coordination deficits ?LUE Sensation: decreased light touch ?LUE Coordination: decreased fine motor;decreased gross motor ?  ?Lower Extremity Assessment ?Lower  Extremity Assessment: Defer to PT evaluation ?LLE Deficits / Details: residual CVA weakness, decreased coordination, and decreased sensation ?LLE Sensation: decreased light touch;decreased proprioception ?LLE Coordination: decreased fine motor ?  ?Cervical / Trunk Assessment ?Cervical / Trunk Assessment: Normal ?  ?Communication  Communication ?Communication: No difficulties ?  ?Cognition Arousal/Alertness: Awake/alert ?Behavior During Therapy: [facility] for tasks assessed/performed ?Overall Cognitive Status: No family/caregiver present to determine baseline cognitive functioning ?Area of Impairment: Memory, Safety/judgement, Awareness, Problem solving, Attention ?  ?  ?  ?  ?  ?  ?  ?  ?  ?Current Attention Level: Sustained ?Memory: Decreased short-term memory ?  ?Safety/Judgement: Decreased awareness of safety ?Awareness: Emergent ?Problem Solving: Slow processing, Difficulty sequencing, Requires verbal cues ?General Comments: Short blessed test with deficits seen in STM, attention, sequencing and problem sovling.  Scored 14/28 with signficant impairments.  Anticipate close to baseline, but continue to assess. ?  ?  ?General Comments  pt on 3L during session with VSS ? ?  ?Exercises   ?  ?Shoulder Instructions    ? ? ?Home Living Family/patient expects to be discharged to:: Private residence ?Living Arrangements: Children;Other relatives (daughter and her family) ?Available Help at Discharge: Family;Available PRN/intermittently (most of the time) ?Type of Home: House ?Home Access: Stairs to enter ?Entrance Stairs-Number of Steps: 2-3 ?Entrance Stairs-Rails: Right ?Home Layout: One level ?  ?  ?Bathroom Shower/Tub: Tub/shower unit ?  ?Bathroom Toilet: Standard ?  ?  ?Home Equipment: Conservation officer, nature (2 wheels) ?  ?  ?  ? ?  ?Prior Functioning/Environment Prior Level of Function : Independent/Modified Independent;Driving ?  ?  ?  ?  ?  ?  ?Mobility Comments: doesn't use assistive device ?ADLs Comments: independent ADLs, drives, does not complete IADLs. manages his own meds. ?  ? ?  ?  ?OT Problem List: Impaired balance (sitting and/or standing);Decreased cognition;Cardiopulmonary status limiting activity;Decreased activity tolerance;Decreased safety awareness ?  ?   ?OT Treatment/Interventions: Self-care/ADL training;Therapeutic exercise;DME and/or  AE instruction;Therapeutic activities;Energy conservation;Patient/family education;Cognitive remediation/compensation  ?  ?OT Goals(Current goals can be found in the care plan section) Acute Rehab OT Goals ?Patient Stated Goal: to get some rest ?OT Goal Formulation: With patient ?Time For Goal Achievement: 05/30/21 ?Potential to Achieve Goals: Good  ?OT Frequency: Min 2X/week ?  ? ?Co-evaluation   ?  ?  ?  ?  ? ?  ?AM-PAC OT "6 Clicks" Daily Activity     ?Outcome Measure Help from another person eating meals?: A Little ?Help from another person taking care of personal grooming?: A Little ?Help from another person toileting, which includes using toliet, bedpan, or urinal?: A Lot ?Help from another person bathing (including washing, rinsing, drying)?: A Little ?Help from another person to put on and taking off regular upper body clothing?: A Little ?Help from another person to put on and taking off regular lower body clothing?: A Little ?6 Click Score: 17 ?  ?End of Session Equipment Utilized During Treatment: Oxygen ?Nurse Communication: Mobility status ? ?Activity Tolerance: Patient limited by fatigue ?Patient left: in bed;with bed alarm set;with call bell/phone within reach ? ?OT Visit Diagnosis: Unsteadiness on feet (R26.81);Other symptoms and signs involving cognitive function  ?              ?Time: [id number] ?OT Time Calculation (min): 15 min ?Charges:  OT General Charges ?$OT Visit: 1 Visit ?OT Evaluation ?$OT Re-eval: 1 Re-eval ? ?Jolaine Artist, OT ?Acute Rehabilitation Services ?Pager [phone] ?Office [phone] ? ? ?Delight Stare ?05/16/2021,  11:59 AM ?

## 2021-05-16 NOTE — Progress Notes (Addendum)
? ?The patient has been seen in conjunction with Sharrell Ku, PA-C. All aspects of care have been considered and discussed. The patient has been personally interviewed, examined, and all clinical data has been reviewed. ? ?Agree with contents of this note.  We will ask electrophysiology to give an opinion concerning management of atrial fibs/flutter and 1 instance of wide-complex tachycardia that required urgent electrical conversion.  Concern for accessory pathway and/or VT substrate.  No acute ischemic/significant CAD identified a cath. ? ? ?Progress Note ? ?Patient Name: [name] ?Date of Encounter: 05/16/2021 ? ?Primary Cardiologist: Sinclair Grooms, MD ? ?Subjective  ? ?Overall feeling well, can occasionally feel palpitations - continues to go in and out of atrial flutter and NSR at this time. He feels sweaty when it comes on. He says that thinking happy thoughts can calm it down. Cath site without complication. ? ?He hasn't been taking his meds prior to admission due to cost. Reports THC use, denies illicit drug use otherwise.  ? ?Inpatient Medications  ?  ?Scheduled Meds: ? amiodarone  200 mg Oral BID  ? Followed by  ? [START ON 05/21/2021] amiodarone  200 mg Oral Daily  ? Chlorhexidine Gluconate Cloth  6 each Topical Daily  ? feeding supplement  237 mL Oral TID BM  ? insulin aspart  0-15 Units Subcutaneous Q4H  ? [START ON 05/17/2021] magnesium oxide  400 mg Oral Daily  ? mouth rinse  15 mL Mouth Rinse BID  ? multivitamin with minerals  1 tablet Oral Daily  ? nicotine  21 mg Transdermal Daily  ? rivaroxaban  20 mg Oral Q supper  ? sodium chloride flush  3 mL Intravenous Q12H  ? ?Continuous Infusions: ? sodium chloride Stopped (05/16/21 1010)  ? sodium chloride    ? sodium chloride    ? ampicillin-sulbactam (UNASYN) IV Stopped (05/16/21 0949)  ? magnesium sulfate bolus IVPB    ? ?PRN Meds: ?sodium chloride, acetaminophen, docusate, labetalol, ondansetron (ZOFRAN) IV, polyethylene glycol, sodium chloride flush   ? ?Vital Signs  ?  ?Vitals:  ? 05/16/21 0945 05/16/21 1000 05/16/21 1200 05/16/21 1212  ?BP:   107/71   ?Pulse:  68 77   ?Resp: (!) 26 (!) 29 (!) 28   ?Temp:    98.4 ?F (36.9 ?C)  ?TempSrc:    Oral  ?SpO2:  97% 95%   ?Weight:      ?Height:      ? ? ?Intake/Output Summary (Last 24 hours) at 05/16/2021 1244 ?Last data filed at 05/16/2021 1045 ?Gross per 24 hour  ?Intake 1990.41 ml  ?Output 1910 ml  ?Net 80.41 ml  ? ? ?  05/16/2021  ?  6:00 AM 05/15/2021  ?  5:02 AM 05/13/2021  ?  3:44 AM  ?Last 3 Weights  ?Weight (lbs) 157 lb 3 oz 169 lb 5 oz 165 lb 12.6 oz  ?Weight (kg) 71.3 kg 76.8 kg 75.2 kg  ?  ? ?Telemetry  ?  ?NSR, intermittent atrial fib/flutter - Personally Reviewed ? ?Physical Exam  ? ?GEN: No acute distress. Somewhat dissheveled appearing. ?HEENT: Normocephalic, atraumatic, sclera non-icteric. ?Neck: No JVD or bruits. ?Cardiac: RRR with intermittent runs of ectopy/A-flutter, no murmurs, rubs, or gallops.  ?Respiratory: Clear to auscultation bilaterally. Breathing is unlabored. ?GI: Soft, nontender, non-distended, BS +x 4. ?MS: no deformity. ?Extremities: No clubbing or cyanosis. No edema. Distal pedal pulses are 2+ and equal bilaterally. Right radial cath site without hematoma; good pulse. Mild ecchymosis noted. ?Neuro:  AAOx3.  Follows commands. ?Psych:  Responds to questions appropriately with a normal affect. ? ?Labs  ?  ?High Sensitivity Troponin:   ?Recent Labs  ?Lab 05/13/21 ?1452 05/13/21 ?1609 05/13/21 ?1748 05/14/21 ?[id number] 05/14/21 ?1036  ?TROPONINIHS 254* 641* 991* 563* 435*  ?   ? ?Cardiac EnzymesNo results for input(s): TROPONINI in the last 168 hours. No results for input(s): TROPIPOC in the last 168 hours.  ? ?Chemistry ?Recent Labs  ?Lab 05/12/21 ?[id number] 05/12/21 ?[id number] 05/14/21 ?0440 05/15/21 ?[id number] 05/16/21 ?0532  ?NA 137   < > 135 137 133*  ?K 2.7*   < > 3.3* 3.4* 4.3  ?CL 104   < > 105 103 101  ?CO2 16*   < > 23 25 24   ?GLUCOSE 298*   < > 101* 138* 106*  ?BUN 16   < > 19 15 16   ?CREATININE 1.39*   <  > 1.03 0.95 1.00  ?CALCIUM 9.1   < > 7.9* 8.6* 8.9  ?PROT 6.8  --   --   --   --   ?ALBUMIN 3.2*  --   --   --   --   ?AST 26  --   --   --   --   ?ALT 10  --   --   --   --   ?ALKPHOS 78  --   --   --   --   ?BILITOT 0.6  --   --   --   --   ?GFRNONAA 56*   < > >60 >60 >60  ?ANIONGAP 17*   < > 7 9 8   ? < > = values in this interval not displayed.  ?  ? ?Hematology ?Recent Labs  ?Lab 05/14/21 ?0440 05/15/21 ?[id number] 05/16/21 ?0532  ?WBC 13.1* 10.9* 13.4*  ?RBC 4.24 4.38 4.21*  ?HGB 13.2 13.8 13.2  ?HCT 38.1* 39.2 39.0  ?MCV 89.9 89.5 92.6  ?MCH 31.1 31.5 31.4  ?MCHC 34.6 35.2 33.8  ?RDW 12.9 13.0 13.2  ?PLT 244 237 248  ? ? ?BNP ?Recent Labs  ?Lab 05/12/21 ?0527  ?BNP 742.2*  ?  ? ?DDimer No results for input(s): DDIMER in the last 168 hours.  ? ?Radiology  ?  ?CARDIAC CATHETERIZATION ? ?Result Date: 05/15/2021 ?  LV end diastolic pressure is normal.   There is no aortic valve stenosis.   Mild, nonobstructive CAD. Continue preventive therapy and management of his acute illness. He had intermittent AFib with RVR during the procedure.  He was in NSR when he left the procedure.    ? ?Cardiac Studies  ? ?Cath 05/15/21 ?  LV end diastolic pressure is normal. ?  There is no aortic valve stenosis. ?  Mild, nonobstructive CAD. ?  ?Continue preventive therapy and management of his acute illness.  ?  ?He had intermittent AFib with RVR during the procedure.  He was in NSR when he left the procedure.   ? ?2D echo 05/12/21 ? ? 1. Extremely limited; essentially no parasternal views and limited views  ?otherwise; LV function appears to be normal using definity; suggest  ?cardiac MRI to better assess.  ? 2. Left ventricular ejection fraction, by estimation, is 60 to 65%. The  ?left ventricle has normal function. Left ventricular endocardial border  ?not optimally defined to evaluate regional wall motion. Left ventricular  ?diastolic function could not be  ?evaluated.  ? 3. Right ventricular systolic function is normal. The right  ventricular  ?size is normal.  ? 4. The  mitral valve was not well visualized. No evidence of mitral valve  ?regurgitation. No evidence of mitral stenosis.  ? 5. The aortic valve was not well visualized. Aortic valve regurgitation  ?Not well visualized.  ? 6. Pulmonic valve regurgitation Not well visualized.  ? 7. The inferior vena cava is normal in size with greater than 50%  ?respiratory variability, suggesting right atrial pressure of 3 mmHg.  ? ?Comparison(s): No prior Echocardiogram.  ? ?Patient Profile  ?   ?66 y.o. male with PAF, remote cryptogenic stroke, HLD, HTN, tobacco abuse. Last seen by cardiology in 2019. Presented with acute respiratory failure with hypoxia ultimately requiring intubation. Troponins elevated to a peak of 873. Has had intermittent atrial fib/flutter this admission and developed WCT treated with urgent cardioversion resulting in sinus tach. Was not taking previous rx meds prior to admission. ? ?Assessment & Plan  ?  ?1. Acute respiratory failure with hypoxia ?- felt multifactorial in setting of aspiration PNA, also suspected pulmonary edema driven by hypertensive urgency and AF RVR ?- extubated 3/30 ?- abx per PCCM ? ?2. Paroxysmal atrial fib/flutter, also with wide complex tachycardia ?- wide complex tachycardia felt possibly VT versus SVT with abberrancy ?- treated with urgent electrical cardioversion (concern for VT with hemodynamic compromise), IV amiodarone, transitioned to oral form by primary team today at 200mg  BID through 4/4 then 200mg  daily ?- still having paroxysms of A-flutter RVR at times with spontaneous reversion back to NSR ?- being transitioned back from heparin to Xarelto ?- will discuss strategy with MD - (from Dr. Tamala Julian) we will continue amiodarone for the time being.  We will get an electrophysiology consultation to help [place] determine if we should continue amiodarone or consider early ablation given the speed of the arrhythmia and the possibility of an accessory  pathway. ?- check thyroid with AM labs ?- K normal s/p repletion, continue to follow ?- Mg 1.9 - will give another 1g today and add MagOx 400mg  daily tomorrow ? ?3. Elevated troponin, likely due to demand ische

## 2021-05-16 NOTE — TOC Benefit Eligibility Note (Signed)
Patient Advocate Encounter  Insurance verification completed.    The patient is currently admitted and upon discharge could be taking Xarelto 20 mg.  The current 30 day co-pay is, $47.00.   The patient is insured through [employer] Medicare Part D    Oluwatoni Rotunno Werth, CPhT Pharmacy Patient Advocate Specialist [facility] Patient Advocate Team Direct Number: [phone]  Fax: [phone]        

## 2021-05-16 NOTE — Progress Notes (Signed)
? ?NAME:  Adnan Geck, MRN:  [id number], DOB:  [birth year], LOS: 4 ?ADMISSION DATE:  05/12/2021, CONSULTATION DATE:  05/12/21 ?REFERRING MD:  EDP, CHIEF COMPLAINT:  SOB  ? ?History of Present Illness:  ?66 yo with pmh R MCA cva in 2016, R occluded ICA, h/o afib  presented in resp distress via ems. All history obtained from edp and chart review as pt is intubated and sedated. Pt was noted to have called ems 2/2 resp distress, sudden onset. Upon arrival emergency found oxygen saturations to be in 30's on RA. Was attempted on cpap but did not tolerate this and arrived to hospital on no supplemental oxygen. He was immediately placed on NRB with increase in sats to 98%.  Pt is tachycardic and hypertensive. Cxr revealed bilateral airspace disease suspicious for acute pulm edema.  ? ?Pertinent  Medical History  ?As above ? ?Significant Hospital Events: ?Including procedures, antibiotic start and stop dates in addition to other pertinent events   ?3/27: intubated and admitted to [facility] ?3/28 Seen alert and interactive on vent ?3/29 Interactive and able to follow commands on vent, no arrhythmias overnight  ?3/30 plan for LHC. Extubated  ?3/31 transfer out of ICU  ? ?Interim History / Subjective:  ?Extubated yesterday ? ?NAEO ? ?Objective   ?Blood pressure 105/82, pulse 68, temperature 98.4 ?F (36.9 ?C), temperature source Oral, resp. rate (!) 29, height 5\' 5"  (1.651 m), weight 71.3 kg, SpO2 97 %. ?   ?Vent Mode: PSV;CPAP ?FiO2 (%):  [40 %] 40 % ?Set Rate:  [20 bmp] 20 bmp ?Vt Set:  [500 mL] 500 mL ?PEEP:  [5 cmH20-8 cmH20] 5 cmH20 ?Pressure Support:  [8 cmH20] 8 cmH20 ?Plateau Pressure:  [19 cmH20] 19 cmH20  ? ?Intake/Output Summary (Last 24 hours) at 05/16/2021 1021 ?Last data filed at 05/16/2021 1000 ?Gross per 24 hour  ?Intake 2187.7 ml  ?Output 1910 ml  ?Net 277.7 ml  ? ?Filed Weights  ? 05/13/21 0344 05/15/21 0502 05/16/21 0600  ?Weight: 75.2 kg 76.8 kg 71.3 kg  ? ? ?Examination:  ?General: Chronically ill older adult M NAD   ?HEENT: NCAT pink mm poor dentition  ?Neuro: AAOx4 following commands  ?CV: rr s1s2 cap refill brisk  ?PULM:  even unlabored. Diminished basilar sounds  ?GI: soft ndnt normoactive x4  ?Extremities: toenail fungus  ?Skin: c/d/w  ? ?Resolved Hospital Problem list   ?HTN emergency ?Mild AKI ? ?Assessment & Plan:  ? ?Acute respiratory failure with hypoxia due to Aspiration PNA  ?-flash pulm edema 2/2 HTN urgency  ?-Unfortunately patient was reintubated afternoon of 3/28 after witnessed transition to V-tach (never arrested). Extubated 3/30 ?P: ?-pulm hygiene, IS  ?-unasyn x 5d course  ? ?Wide complex tachycardia / VT, sp DCCV ?Hx Afib ?Hx HTN  ?Hx HLD ?Non-obstructive CAD ?(HTN urgency this admission with Presenting SBP 190s, quickly corrected to SBP 80-90)  ?P ?-transitioning amio gtt to PO ?-transitioning hep gtt to Xarelto  ?-K > 4, Mag > 2  ? ?Hx R MCA CVA  ?P: ?-supportive care  ? ?Hypokalemia, improved ?P:  ?-trend lytes, replace as needed  ?-goal K > 4, Mag > 2  ? ?Best Practice (right click and "Reselect all SmartList Selections" daily)  ? ?Diet/type: NPO ?DVT prophylaxis: systemic heparin -- transitioning to xarelto ?GI prophylaxis: N/A ?Lines: Central line and Arterial Line Orders to dc 3/31  ?Foley:  N/A ?Code Status:  full code ?Last date of multidisciplinary goals of care discussion:  3/31 pt updated   ? ?  Dispo: stable for transfer out of ICU 3/31 ? ?CCT: n/a ? ?Eliseo Gum MSN, AGACNP-BC ?[facility] ?Amion for pager ?05/16/2021, 10:21 AM ? ? ? ? ? ? ?  ?

## 2021-05-16 NOTE — Discharge Instructions (Signed)

## 2021-05-16 NOTE — Consult Note (Addendum)
? ?ELECTROPHYSIOLOGY CONSULT NOTE  ? ? ?Patient ID: [name] ?MRN: [id number], DOB/AGE: 65-Jan-1957 65 y.o. ? ?Admit date: 05/12/2021 ?Date of Consult: 05/16/2021 ? ?Primary Physician: Harrison Mons, PA ?Primary Cardiologist: Sinclair Grooms, MD  ?Electrophysiologist: Dr. Lovena Le ? ?Referring Provider: Dr. Tamala Julian ? ?Patient Profile: ?[name] is a 66 y.o. male with a history of PAF, remote cryptogenic stroke 2016 and ILR with PAF identified, HLD, HTN, and tobacco abuse who is being seen today for the evaluation of Atrial flutter RVR and WCT at the request of Dr. Daneen Schick. ? ?HPI:  ?[name] is a 66 y.o. male who presented to [facility] 05/12/2021 with acute respiratory distress. He failed CPAP and required intubation.  Admitted for acute hypoxic respiratory failure in setting of flash pulmonary edema due to HTN urgency. Treated with broad-spectrum ABX.  Placed on heparin for HS torp 17 -> 873 -> 861 (peak). No reported prodromal symptoms or arrest. Given shock x 1 with conversion and strated on IV amio with bolus.    ? ?Labs on admission included K 2.7, Cr 1.39, WBC 17.2, Hgb 16.7, BNP 742. Blood Cx no growth x 4 days thus far.  ? ?Echo 05/12/2021 showed LVEF 60-65%. ? ?On 3/28 he had recurrent RVR including several short runs (3-5 beats) of what appear to be polymorphic VT in the setting of acute illness and electrolyte imbalances (though K 3.6 and Mg 1.8 that day improved from admission) ? ?LHC 05/15/2021 showed no CAD.  ? ?He has continued to go in and out of flutter/fib with variable rates and EP asked to see for further recommendations regarding therapy including AAD and procedural candidacy.  ? ?Pt is currently lying in bed with NAD. He is doing OK. He states he has felt better and worse this admission. ? ?Past Medical History:  ?Diagnosis Date  ? AKI (acute kidney injury) ([place]) 03/20/2015  ? Alcohol use with alcohol-induced mood disorder ([place])   ? quit almost all ETOH in 07/2014, previously drank 18 to 20 beers  daily.   ? Alterations of sensations following CVA (cerebrovascular accident) 09/10/2014  ? Arthritis   ? Colon polyps 2011  ? Hyperplastic and 1 tubular adenoma.  Dr Collene Mares  ? 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 a. identified on TEE at time of stroke  ? Embolic stroke involving right middle cerebral artery ([place]) 10/29/2014  ? Gout   ? Hyperlipidemia   ? Hypertension   ? Paroxysmal atrial fibrillation (HCC)   ? a. identified on LINQ 08/2014  ? Stroke [facility]) 07/25/14  ? a. s/p MDT ILR implant.  on xarelto  ?  ? ?Surgical History:  ?Past Surgical History:  ?Procedure Laterality Date  ? COLONOSCOPY  2011  ? Dr Collene Mares.   ? EP IMPLANTABLE DEVICE N/A 07/27/2014  ? Procedure: Loop Recorder Insertion;  Surgeon: Evans Lance, MD;  Location: [place] [facility];  Service: Cardiovascular;  Laterality: N/A;  ? EP IMPLANTABLE DEVICE N/A 12/20/2014  ? Procedure: Loop Recorder Removal;  Surgeon: Evans Lance, MD;  Location: [place] [facility];  Service: Cardiovascular;  Laterality: N/A;  ? ESOPHAGOGASTRODUODENOSCOPY N/A 03/21/2015  ? Procedure: ESOPHAGOGASTRODUODENOSCOPY (EGD);  Surgeon: Carol Ada, MD;  Location: [facility] ENDOSCOPY;  Service: Endoscopy;  Laterality: N/A;  ? LEFT HEART CATH AND CORONARY ANGIOGRAPHY N/A 05/15/2021  ? Procedure: LEFT HEART CATH AND CORONARY ANGIOGRAPHY;  Surgeon: Jettie Booze, MD;  Location: [place] [facility];  Service: Cardiovascular;  Laterality: N/A;  ? LOOP RECORDER IMPLANT  07/27/14  ? LOOP REVEAL LINQ [id number]  ? loop recorder removed 2017    ? TEE WITHOUT CARDIOVERSION N/A 07/27/2014  ? Procedure: TRANSESOPHAGEAL ECHOCARDIOGRAM (TEE);  Surgeon: Pixie Casino, MD;  Location: [place];  Service: Cardiovascular;  Laterality: N/A;  ?  ? ?Medications Prior to Admission  ?Medication Sig Dispense Refill Last Dose  ? atorvastatin (LIPITOR) 80 MG tablet TAKE 1 TABLET BY MOUTH  DAILY AT 6 PM (Patient taking differently: Take 80 mg by mouth daily at 6 PM.) 90 tablet 3 Past Week  ? buPROPion (WELLBUTRIN XL)  150 MG 24 hr tablet Take 1 tablet (150 mg total) by mouth daily. (Patient not taking: Reported on 05/13/2021) 90 tablet 1 Not Taking  ? busPIRone (BUSPAR) 5 MG tablet Take 5 mg by mouth 3 (three) times daily. (Patient not taking: Reported on 05/13/2021)   Not Taking  ? colchicine 0.6 MG tablet Take 1 tablet (0.6 mg total) by mouth 2 (two) times daily as needed (gout). (Patient not taking: Reported on 06/10/2018) 20 tablet 1 Not Taking  ? ezetimibe (ZETIA) 10 MG tablet Take 1 tablet (10 mg total) by mouth daily. (Patient not taking: Reported on 05/13/2021) 90 tablet 3 Not Taking  ? gabapentin (NEURONTIN) 300 MG capsule Take 1 capsule (300 mg total) by mouth 3 (three) times daily. (Patient not taking: Reported on 05/13/2021) 270 capsule 1 Not Taking  ? lisinopril (PRINIVIL,ZESTRIL) 5 MG tablet Take 1 tablet (5 mg total) by mouth daily. (Patient not taking: Reported on 05/13/2021) 90 tablet 3 Not Taking  ? rivaroxaban (XARELTO) 20 MG TABS tablet TAKE 1 TABLET(20 MG) BY MOUTH DAILY WITH DINNER (Patient not taking: Reported on 05/13/2021) 90 tablet 3 Not Taking  ? sertraline (ZOLOFT) 100 MG tablet Take 1 tablet (100 mg total) by mouth daily. (Patient not taking: Reported on 05/13/2021) 90 tablet 3 Not Taking  ? ? ?Inpatient Medications:  ? amiodarone  200 mg Oral BID  ? Followed by  ? [START ON 05/21/2021] amiodarone  200 mg Oral Daily  ? Chlorhexidine Gluconate Cloth  6 each Topical Daily  ? feeding supplement  237 mL Oral TID BM  ? insulin aspart  0-15 Units Subcutaneous Q4H  ? [START ON 05/17/2021] magnesium oxide  400 mg Oral Daily  ? mouth rinse  15 mL Mouth Rinse BID  ? multivitamin with minerals  1 tablet Oral Daily  ? nicotine  21 mg Transdermal Daily  ? rivaroxaban  20 mg Oral Q supper  ? sodium chloride flush  3 mL Intravenous Q12H  ? ? ?Allergies: No Known Allergies ? ?Social History  ? ?Socioeconomic History  ? Marital status: Widowed  ?  Spouse name: Marcie Bal  ? Number of children: 3  ? Years of education: 33  ? Highest  education level: Not on file  ?Occupational History  ? Occupation: former Aeronautical engineer  ?  Comment: disabled due to CVA  ?Tobacco Use  ? Smoking status: Every Day  ?  Packs/day: 0.50  ?  Years: 40.00  ?  Pack years: 20.00  ?  Types: Cigarettes  ? Smokeless tobacco: Never  ? Tobacco comments:  ?  cut back after stroke, but increased again due to depression  ?Vaping Use  ? Vaping Use: Never used  ?Substance and Sexual Activity  ? Alcohol use: Not Currently  ? Drug use: Yes  ?  Types: Marijuana  ?  Comment: trying to help his appetite  ? Sexual activity: Yes  ?  Partners: Female  ?Other Topics Concern  ? Not on file  ?Social History Narrative  ? Lives at home with wife, Marcie Bal  ? Caffeine use - tea 4-5 glasses a day  ? ?Social Determinants of Health  ? ?Financial Resource Strain: Not on file  ?Food Insecurity: Not on file  ?Transportation Needs: Not on file  ?Physical Activity: Not on file  ?Stress: Not on file  ?Social Connections: Not on file  ?Intimate Partner Violence: Not on file  ?  ? ?Family History  ?Problem Relation Age of Onset  ? Cancer Father   ?     leukemia  ? Heart attack Mother   ? Heart disease Brother   ? Heart attack Brother 60  ?     CABG, stenting  ? Hypertension Brother   ? Alcohol abuse Brother   ?  ? ?Review of Systems: ?All other systems reviewed and are otherwise negative except as noted above. ? ?Physical Exam: ?Vitals:  ? 05/16/21 1000 05/16/21 1200 05/16/21 1212 05/16/21 1309  ?BP:  107/71  118/77  ?Pulse: 68 77  (!) 110  ?Resp: (!) 29 (!) 28  20  ?Temp:   98.4 ?F (36.9 ?C) 99.1 ?F (37.3 ?C)  ?TempSrc:   Oral Oral  ?SpO2: 97% 95%  100%  ?Weight:      ?Height:      ? ? ?GEN- The patient is well appearing, alert and oriented x 3 today.   ?HEENT: normocephalic, atraumatic; sclera clear, conjunctiva pink; hearing intact; oropharynx clear; neck supple ?Lungs- Clear to ausculation bilaterally, normal work of breathing.  No wheezes, rales, rhonchi ?Heart- Regular rate and rhythm, no murmurs, rubs or  gallops ?GI- soft, non-tender, non-distended, bowel sounds present ?Extremities- no clubbing, cyanosis, or edema; DP/PT/radial pulses 2+ bilaterally ?MS- no significant deformity or atrophy ?Skin- warm

## 2021-05-17 DIAGNOSIS — R000 Tachycardia, unspecified: Secondary | ICD-10-CM

## 2021-05-17 LAB — CULTURE, BLOOD (ROUTINE X 2)
Culture: NO GROWTH
Culture: NO GROWTH

## 2021-05-17 LAB — GLUCOSE, CAPILLARY
Glucose-Capillary: 106 mg/dL — ABNORMAL HIGH (ref 70–99)
Glucose-Capillary: 100 mg/dL — ABNORMAL HIGH (ref 70–99)
Glucose-Capillary: 138 mg/dL — ABNORMAL HIGH (ref 70–99)
Glucose-Capillary: 97 mg/dL (ref 70–99)
Glucose-Capillary: 116 mg/dL — ABNORMAL HIGH (ref 70–99)

## 2021-05-17 LAB — CBC
HCT: 42.9 % (ref 39.0–52.0)
Hemoglobin: 14.4 g/dL (ref 13.0–17.0)
MCH: 31.3 pg (ref 26.0–34.0)
MCHC: 33.6 g/dL (ref 30.0–36.0)
MCV: 93.3 fL (ref 80.0–100.0)
Platelets: 246 10*3/uL (ref 150–400)
RBC: 4.6 MIL/uL (ref 4.22–5.81)
RDW: 13 % (ref 11.5–15.5)
WBC: 11.8 10*3/uL — ABNORMAL HIGH (ref 4.0–10.5)
nRBC: 0 % (ref 0.0–0.2)

## 2021-05-17 LAB — LIPID PANEL
Cholesterol: 242 mg/dL — ABNORMAL HIGH (ref 0–200)
HDL: 43 mg/dL (ref >40)
LDL Cholesterol: 179 mg/dL — ABNORMAL HIGH (ref 0–99)
Total CHOL/HDL Ratio: 5.6 RATIO
Triglycerides: 101 mg/dL (ref <150)
VLDL: 20 mg/dL (ref 0–40)

## 2021-05-17 LAB — BASIC METABOLIC PANEL
Anion gap: 7 (ref 5–15)
BUN: 11 mg/dL (ref 8–23)
CO2: 26 mmol/L (ref 22–32)
Calcium: 9.4 mg/dL (ref 8.9–10.3)
Chloride: 104 mmol/L (ref 98–111)
Creatinine, Ser: 1.06 mg/dL (ref 0.61–1.24)
GFR, Estimated: >60 mL/min (ref >60)
Glucose, Bld: 100 mg/dL — ABNORMAL HIGH (ref 70–99)
Potassium: 4.4 mmol/L (ref 3.5–5.1)
Sodium: 137 mmol/L (ref 135–145)

## 2021-05-17 LAB — TSH: TSH: 4.002 u[IU]/mL (ref 0.350–4.500)

## 2021-05-17 LAB — MAGNESIUM: Magnesium: 2 mg/dL (ref 1.7–2.4)

## 2021-05-17 MED ORDER — METOPROLOL TARTRATE 50 MG PO TABS
50.0000 mg | ORAL_TABLET | Freq: Two times a day (BID) | ORAL | Status: DC
Start: 2021-05-17 — End: 2021-05-19
  Administered 2021-05-17 – 2021-05-19 (×4): 50 mg via ORAL
  Filled 2021-05-17 (×5): qty 1

## 2021-05-17 MED ORDER — ROSUVASTATIN CALCIUM 20 MG PO TABS
20.0000 mg | ORAL_TABLET | Freq: Every day | ORAL | Status: DC
Start: 2021-05-17 — End: 2021-05-19
  Administered 2021-05-17 – 2021-05-18 (×2): 20 mg via ORAL
  Filled 2021-05-17 (×2): qty 1

## 2021-05-17 MED ORDER — METOPROLOL TARTRATE 50 MG PO TABS: 50.0000 mg | ORAL_TABLET | Freq: Three times a day (TID) | ORAL | Status: DC

## 2021-05-17 NOTE — Progress Notes (Signed)
?PROGRESS NOTE ? ? ? [name]  [id number] DOB: [birth year] DOA: 05/12/2021 ?PCP: Harrison Mons, PA  ? ? ?Brief Narrative:  ?66 year old male with a history of PAF, hypertension, presented with respiratory failure secondary to flash pulmonary edema/pneumonia required mechanical ventilation.  Troponin peak up to 873.  Seen by cardiology and underwent cardiac cath which showed nonobstructive disease.  Hospital course complicated by development of wide-complex tachycardia, started on amiodarone.  He has since been extubated and heart rate has been stable.  Transferred to [facility] service on 4/1 ? ? ?Assessment & Plan: ?  ?Principal Problem: ?  Acute pulmonary edema ([place]) ?Active Problems: ?  Malnutrition of moderate degree ?  Wide-complex tachycardia ?  Respiratory distress ? ? ?Acute respiratory failure with hypoxia ?Secondary to aspiration pneumonia and possibly pulmonary edema ?Patient required mechanical ventilation from 3/27 to 3/30 ?Since extubation, his respiratory status has been stable ? ?Aspiration pneumonia ?He is on a course of Unasyn ?Continue pulmonary hygiene ? ?Hypertensive urgency with flash pulmonary edema ?Blood pressure currently stable ?Continue on metoprolol ? ?History of atrial fib ?Wide-complex tachycardia status post DCCV ?Cardiology following ?Treated with amiodarone ?Now on metoprolol ?Anticoagulated with Xarelto ?Underwent cardiac cath showed nonobstructive coronary disease ?Will need outpatient follow-up with EP and cardiology ? ?Elevated troponin ?Felt to be demand ischemia ? ? ?DVT prophylaxis: SCDs Start: 05/12/21 0642 ?rivaroxaban (XARELTO) tablet 20 mg  ?Code Status: Full code ?Family Communication: Discussed with patient ?Disposition Plan: Status is: Inpatient ?Remains inpatient appropriate because: Continued management of cardiac issues ? ? ? ? ?Consultants:  ?Cardiology ?Electrophysiology ?PCCM ? ?Procedures:  ?ETT 3/27> 3/30 ?Cardiac catheterization 3/30 ? ?Antimicrobials:   ?Unasyn 3/28>  ? ? ?Subjective: ?Feels better.  Shortness of breath improving. ? ?Objective: ?Vitals:  ? 05/17/21 0825 05/17/21 1139 05/17/21 1530 05/17/21 2011  ?BP:  128/82 118/62 113/73  ?Pulse:  65 66 68  ?Resp: 20 15 16 19   ?Temp:  97.8 ?F (36.6 ?C)  98 ?F (36.7 ?C)  ?TempSrc:  Oral  Oral  ?SpO2:  98% 95% 95%  ?Weight:      ?Height:      ? ?No intake or output data in the 24 hours ending 05/17/21 2150 ?Filed Weights  ? 05/15/21 0502 05/16/21 0600 05/17/21 0301  ?Weight: 76.8 kg 71.3 kg 70.8 kg  ? ? ?Examination: ? ?General exam: Appears calm and comfortable  ?Respiratory system: Clear to auscultation. Respiratory effort normal. ?Cardiovascular system: S1 & S2 heard, RRR. No JVD, murmurs, rubs, gallops or clicks. No pedal edema. ?Gastrointestinal system: Abdomen is nondistended, soft and nontender. No organomegaly or masses felt. Normal bowel sounds heard. ?Central nervous system: Alert and oriented. No focal neurological deficits. ?Extremities: Symmetric 5 x 5 power. ?Skin: No rashes, lesions or ulcers ?Psychiatry: Judgement and insight appear normal. Mood & affect appropriate.  ? ? ? ?Data Reviewed: I have personally reviewed following labs and imaging studies ? ?CBC: ?Recent Labs  ?Lab 05/12/21 ?[id number] 05/12/21 ?[id number] 05/13/21 ?[id number] 05/13/21 ?1617 05/14/21 ?0411 05/14/21 ?0440 05/15/21 ?[id number] 05/16/21 ?0532 05/17/21 ?0251  ?WBC 17.2*   < > 11.6*  --   --  13.1* 10.9* 13.4* 11.8*  ?NEUTROABS 6.9  --   --   --   --   --   --   --   --   ?HGB 16.7   < > 14.0   < > 12.9* 13.2 13.8 13.2 14.4  ?HCT 51.8   < > 41.4   < > 38.0* 38.1* 39.2 39.0 42.9  ?  MCV 98.9   < > 92.4  --   --  89.9 89.5 92.6 93.3  ?PLT 312   < > 245  --   --  244 237 248 246  ? < > = values in this interval not displayed.  ? ?Basic Metabolic Panel: ?Recent Labs  ?Lab 05/12/21 ?1653 05/13/21 ?0736 05/13/21 ?1452 05/13/21 ?1617 05/14/21 ?0411 05/14/21 ?0440 05/15/21 ?[id number] 05/16/21 ?0532 05/17/21 ?0251  ?NA  --  139 137   < > 139 135 137 133* 137  ?K   --  3.7 3.6   < > 3.5 3.3* 3.4* 4.3 4.4  ?CL  --  109 107  --   --  105 103 101 104  ?CO2  --  21* 19*  --   --  23 25 24 26   ?GLUCOSE  --  147* 201*  --   --  101* 138* 106* 100*  ?BUN  --  22 24*  --   --  19 15 16 11   ?CREATININE  --  1.24 1.21  --   --  1.03 0.95 1.00 1.06  ?CALCIUM  --  8.7* 8.8*  --   --  7.9* 8.6* 8.9 9.4  ?MG 1.9 1.8 2.0  --   --  2.1 1.8 1.9 2.0  ?PHOS 3.4 2.0* 5.3*  --   --  2.1* 2.5  --   --   ? < > = values in this interval not displayed.  ? ?GFR: ?Estimated Creatinine Clearance: 60.4 mL/min (by C-G formula based on SCr of 1.06 mg/dL). ?Liver Function Tests: ?Recent Labs  ?Lab 05/12/21 ?0527  ?AST 26  ?ALT 10  ?ALKPHOS 78  ?BILITOT 0.6  ?PROT 6.8  ?ALBUMIN 3.2*  ? ?No results for input(s): LIPASE, AMYLASE in the last 168 hours. ?No results for input(s): AMMONIA in the last 168 hours. ?Coagulation Profile: ?Recent Labs  ?Lab 05/12/21 ?0527  ?INR 1.0  ? ?Cardiac Enzymes: ?No results for input(s): CKTOTAL, CKMB, CKMBINDEX, TROPONINI in the last 168 hours. ?BNP (last 3 results) ?No results for input(s): PROBNP in the last 8760 hours. ?HbA1C: ?No results for input(s): HGBA1C in the last 72 hours. ?CBG: ?Recent Labs  ?Lab 05/16/21 ?2133 05/17/21 ?0739 05/17/21 ?1137 05/17/21 ?1623 05/17/21 ?2018  ?GLUCAP 106* 100* 138* 97 116*  ? ?Lipid Profile: ?Recent Labs  ?  05/17/21 ?0251  ?CHOL 242*  ?HDL 43  ?LDLCALC 179*  ?TRIG 101  ?CHOLHDL 5.6  ? ?Thyroid Function Tests: ?Recent Labs  ?  05/17/21 ?0251  ?TSH 4.002  ? ?Anemia Panel: ?No results for input(s): VITAMINB12, FOLATE, FERRITIN, TIBC, IRON, RETICCTPCT in the last 72 hours. ?Sepsis Labs: ?Recent Labs  ?Lab 05/12/21 ?[id number] 05/12/21 ?1155 05/13/21 ?0736 05/14/21 ?0440  ?PROCALCITON  --  0.73 0.67 0.53  ?LATICACIDVEN 7.7* 3.2*  --   --   ? ? ?Recent Results (from the past 240 hour(s))  ?Blood Culture (routine x 2)     Status: None  ? Collection Time: 05/12/21  5:21 AM  ? Specimen: BLOOD RIGHT ARM  ?Result Value Ref Range Status  ? Specimen  Description BLOOD RIGHT ARM  Final  ? Special Requests   Final  ?  BOTTLES DRAWN AEROBIC AND ANAEROBIC Blood Culture results may not be optimal due to an inadequate volume of blood received in culture bottles  ? Culture   Final  ?  NO GROWTH 5 DAYS ?Performed at [facility], [place] [address] [place], [place] [address] ?  ?  Report Status 05/17/2021 FINAL  Final  ?Resp Panel by RT-PCR (Flu A&B, Covid) Nasopharyngeal Swab     Status: None  ? Collection Time: 05/12/21  5:23 AM  ? Specimen: Nasopharyngeal Swab; Nasopharyngeal(NP) swabs in vial transport medium  ?Result Value Ref Range Status  ? SARS Coronavirus 2 by RT PCR NEGATIVE NEGATIVE Final  ?  Comment: (NOTE) ?SARS-CoV-2 target nucleic acids are NOT DETECTED. ? ?The SARS-CoV-2 RNA is generally detectable in upper respiratory ?specimens during the acute phase of infection. The lowest ?concentration of SARS-CoV-2 viral copies this assay can detect is ?138 copies/mL. A negative result does not preclude SARS-Cov-2 ?infection and should not be used as the sole basis for treatment or ?other patient management decisions. A negative result may occur with  ?improper specimen collection/handling, submission of specimen other ?than nasopharyngeal swab, presence of viral mutation(s) within the ?areas targeted by this assay, and inadequate number of viral ?copies(<138 copies/mL). A negative result must be combined with ?clinical observations, patient history, and epidemiological ?information. The expected result is Negative. ? ?Fact Sheet for Patients:  ?EntrepreneurPulse.com.au ? ?Fact Sheet for Healthcare Providers:  ?IncredibleEmployment.be ? ?This test is no t yet approved or cleared by the [place] FDA and  ?has been authorized for detection and/or diagnosis of SARS-CoV-2 by ?FDA under an Emergency Use Authorization (EUA). This EUA will remain  ?in effect (meaning this test can be used) for the duration of the ?COVID-19  declaration under Section 564(b)(1) of the Act, 21 ?U.S.C.section 360bbb-3(b)(1), unless the authorization is terminated  ?or revoked sooner.  ? ? ?  ? Influenza A by PCR NEGATIVE NEGATIVE Final  ? Influenza B by PCR NEGA

## 2021-05-17 NOTE — Progress Notes (Signed)
? ? ?Progress Note ? ?Patient Name: [name] ?Date of Encounter: 05/17/2021 ? ?Primary Cardiologist: Sinclair Grooms, MD ? ?Subjective  ? ?Overall feeling well, can occasionally feel palpitations when get gest scared. ? ?NO CP, SOB.  He is able to ambulate around the room without issue but is weak. ? ?Inpatient Medications  ?  ?Scheduled Meds: ? amiodarone  400 mg Oral BID  ? Followed by  ? [START ON 05/23/2021] amiodarone  200 mg Oral BID  ? feeding supplement  237 mL Oral TID BM  ? insulin aspart  0-15 Units Subcutaneous TID WC  ? insulin aspart  0-5 Units Subcutaneous QHS  ? magnesium oxide  400 mg Oral Daily  ? mouth rinse  15 mL Mouth Rinse BID  ? metoprolol tartrate  25 mg Oral Q8H  ? multivitamin with minerals  1 tablet Oral Daily  ? nicotine  21 mg Transdermal Daily  ? rivaroxaban  20 mg Oral Q supper  ? sodium chloride flush  3 mL Intravenous Q12H  ? ?Continuous Infusions: ? sodium chloride Stopped (05/16/21 1010)  ? sodium chloride    ? sodium chloride    ? ampicillin-sulbactam (UNASYN) IV 3 g (05/17/21 1115)  ? ?PRN Meds: ?sodium chloride, acetaminophen, docusate, labetalol, ondansetron (ZOFRAN) IV, polyethylene glycol, sodium chloride flush  ? ?Vital Signs  ?  ?Vitals:  ? 05/17/21 0301 05/17/21 [id number] 05/17/21 0641 05/17/21 1139  ?BP: (!) 135/96 95/71 108/87 128/82  ?Pulse: 69  63 65  ?Resp: 20 (!) 25  15  ?Temp: 98.1 ?F (36.7 ?C)   97.8 ?F (36.6 ?C)  ?TempSrc: Oral   Oral  ?SpO2: 98%   98%  ?Weight: 70.8 kg     ?Height:      ? ? ?Intake/Output Summary (Last 24 hours) at 05/17/2021 1225 ?Last data filed at 05/16/2021 2100 ?Gross per 24 hour  ?Intake 200 ml  ?Output 675 ml  ?Net -475 ml  ? ? ?  05/17/2021  ?  3:01 AM 05/16/2021  ?  6:00 AM 05/15/2021  ?  5:02 AM  ?Last 3 Weights  ?Weight (lbs) 156 lb 1.6 oz 157 lb 3 oz 169 lb 5 oz  ?Weight (kg) 70.806 kg 71.3 kg 76.8 kg  ?  ? ?Telemetry  ?  ?NSR, intermittent atrial fib/flutter - Personally Reviewed ? ?Physical Exam  ? ?GEN: No acute distress. Chronically ill  appearing ?HEENT: Normocephalic, atraumatic, sclera non-icteric. ?Neck: No JVD  ?Cardiac: RRR with intermittent runs of ectopy/A-flutter, no murmurs, rubs, or gallops.  ?Respiratory: Clear to auscultation bilaterally. Breathing is unlabored. ?GI: Soft, nontender, non-distended, ?MS: no deformity.  Moves all extremities ?Extremities: No clubbing or cyanosis. No edema. Distal pedal pulses are 2+ and equal bilaterally. Right radial cath site without hematoma; good pulse.  ?Neuro:  AAOx3. Follows commands. ?Psych:  Responds to questions appropriately with a normal affect. ? ?Labs  ?  ?High Sensitivity Troponin:   ?Recent Labs  ?Lab 05/13/21 ?1452 05/13/21 ?1609 05/13/21 ?1748 05/14/21 ?[id number] 05/14/21 ?1036  ?TROPONINIHS 254* 641* 991* 563* 435*  ?   ? ?Cardiac EnzymesNo results for input(s): TROPONINI in the last 168 hours. No results for input(s): TROPIPOC in the last 168 hours.  ? ?Chemistry ?Recent Labs  ?Lab 05/12/21 ?[id number] 05/12/21 ?[id number] 05/15/21 ?[id number] 05/16/21 ?0532 05/17/21 ?0251  ?NA 137   < > 137 133* 137  ?K 2.7*   < > 3.4* 4.3 4.4  ?CL 104   < > 103 101 104  ?CO2 16*   < >  25 24 26   ?GLUCOSE 298*   < > 138* 106* 100*  ?BUN 16   < > 15 16 11   ?CREATININE 1.39*   < > 0.95 1.00 1.06  ?CALCIUM 9.1   < > 8.6* 8.9 9.4  ?PROT 6.8  --   --   --   --   ?ALBUMIN 3.2*  --   --   --   --   ?AST 26  --   --   --   --   ?ALT 10  --   --   --   --   ?ALKPHOS 78  --   --   --   --   ?BILITOT 0.6  --   --   --   --   ?GFRNONAA 56*   < > >60 >60 >60  ?ANIONGAP 17*   < > 9 8 7   ? < > = values in this interval not displayed.  ?  ? ?Hematology ?Recent Labs  ?Lab 05/15/21 ?[id number] 05/16/21 ?0532 05/17/21 ?0251  ?WBC 10.9* 13.4* 11.8*  ?RBC 4.38 4.21* 4.60  ?HGB 13.8 13.2 14.4  ?HCT 39.2 39.0 42.9  ?MCV 89.5 92.6 93.3  ?MCH 31.5 31.4 31.3  ?MCHC 35.2 33.8 33.6  ?RDW 13.0 13.2 13.0  ?PLT 237 248 246  ? ? ?BNP ?Recent Labs  ?Lab 05/12/21 ?0527  ?BNP 742.2*  ?  ? ?DDimer No results for input(s): DDIMER in the last 168 hours.  ? ?Radiology   ?  ?No results found. ? ?Cardiac Studies  ? ?Cath 05/15/21 ?  LV end diastolic pressure is normal. ?  There is no aortic valve stenosis. ?  Mild, nonobstructive CAD. ?  ?Continue preventive therapy and management of his acute illness.  ?  ?He had intermittent AFib with RVR during the procedure.  He was in NSR when he left the procedure.   ? ?2D echo 05/12/21 ? ? 1. Extremely limited; essentially no parasternal views and limited views  ?otherwise; LV function appears to be normal using definity; suggest  ?cardiac MRI to better assess.  ? 2. Left ventricular ejection fraction, by estimation, is 60 to 65%. The  ?left ventricle has normal function. Left ventricular endocardial border  ?not optimally defined to evaluate regional wall motion. Left ventricular  ?diastolic function could not be  ?evaluated.  ? 3. Right ventricular systolic function is normal. The right ventricular  ?size is normal.  ? 4. The mitral valve was not well visualized. No evidence of mitral valve  ?regurgitation. No evidence of mitral stenosis.  ? 5. The aortic valve was not well visualized. Aortic valve regurgitation  ?Not well visualized.  ? 6. Pulmonic valve regurgitation Not well visualized.  ? 7. The inferior vena cava is normal in size with greater than 50%  ?respiratory variability, suggesting right atrial pressure of 3 mmHg.  ? ?Comparison(s): No prior Echocardiogram.  ? ?Patient Profile  ?   ?66 y.o. male with PAF, remote cryptogenic stroke, HLD, HTN, tobacco abuse. Last seen by cardiology in 2019. Presented with acute respiratory failure with hypoxia ultimately requiring intubation. Troponins elevated to a peak of 873. Has had intermittent atrial fib/flutter this admission and developed WCT treated with urgent cardioversion resulting in sinus tach. Was not taking previous rx meds prior to admission. ? ?Assessment & Plan  ?  ?1. Acute respiratory failure with hypoxia ?- felt multifactorial in setting of aspiration PNA, also suspected  pulmonary edema driven by hypertensive urgency and AF RVR ?-  CXR clear  ? ?2. Paroxysmal atrial fib/flutter, also with wide complex tachycardia ?- I am unclear why he was switch back to IV amiodarone early (see EP notes) ?- we will increase metoprolol to 50 mg PO BID and if still no improvement will transition to IV amiodarone for 1-2 days ? ?3. Elevated troponin, likely due to demand ischemia ?- cath 05/15/21 with mild nonobstructive CAD, unlikely to be ischemic pulmonary edema ?- no ASA given concomitant Xarelto ?- LDL 179 fasting, starting therapy ? ?4. Essential HTN ?- BP now running low-normal, not on any meds ? ?Transition of care team consult placed for med assistance 05/16/21. ? ?Discussed Marijuana Cessation and that CYP interactions can be associated with inappropriate metabolism of  ?- Anti-arrhythmic drugs ?- BB ?- discussed 3.3 X higher risk of stroke/vascular disease with marijuana use ?Margaretha Sheffield 07/24/20) ? ? ?For questions or updates, please contact [place] ?Please consult [email or web address] for contact info under Cardiology/STEMI. ? ?Signed, ?Werner Lean, MD ?05/17/2021, 12:25 PM   ? ?

## 2021-05-18 LAB — CBC
HCT: 39 % (ref 39.0–52.0)
Hemoglobin: 13.5 g/dL (ref 13.0–17.0)
MCH: 31.8 pg (ref 26.0–34.0)
MCHC: 34.6 g/dL (ref 30.0–36.0)
MCV: 92 fL (ref 80.0–100.0)
Platelets: 265 10*3/uL (ref 150–400)
RBC: 4.24 MIL/uL (ref 4.22–5.81)
RDW: 12.7 % (ref 11.5–15.5)
WBC: 12.1 10*3/uL — ABNORMAL HIGH (ref 4.0–10.5)
nRBC: 0 % (ref 0.0–0.2)

## 2021-05-18 LAB — GLUCOSE, CAPILLARY
Glucose-Capillary: 108 mg/dL — ABNORMAL HIGH (ref 70–99)
Glucose-Capillary: 93 mg/dL (ref 70–99)
Glucose-Capillary: 87 mg/dL (ref 70–99)
Glucose-Capillary: 113 mg/dL — ABNORMAL HIGH (ref 70–99)

## 2021-05-18 MED ORDER — AMIODARONE HCL IN DEXTROSE 360-4.14 MG/200ML-% IV SOLN: 60.0000 mg/h | INTRAVENOUS | Status: AC

## 2021-05-18 MED ORDER — LOPERAMIDE HCL 2 MG PO CAPS
2.0000 mg | ORAL_CAPSULE | Freq: Four times a day (QID) | ORAL | Status: DC | PRN
  Administered 2021-05-18: 2 mg via ORAL
  Filled 2021-05-18: qty 1

## 2021-05-18 MED ORDER — AMIODARONE HCL IN DEXTROSE 360-4.14 MG/200ML-% IV SOLN
30.0000 mg/h | INTRAVENOUS | Status: DC
  Administered 2021-05-18 (×2): 60 mg/h via INTRAVENOUS
  Filled 2021-05-18 (×2): qty 200

## 2021-05-18 NOTE — Progress Notes (Signed)
? ? ?Progress Note ? ?Patient Name: [name] ?Date of Encounter: 05/18/2021 ? ?Primary Cardiologist: Sinclair Grooms, MD ? ?Subjective  ? ?Off O2.  No CP, no Palpitations. No SOB.  Ready to leave. ?Still having intermittent AF 130s ? ?Inpatient Medications  ?  ?Scheduled Meds: ? feeding supplement  237 mL Oral TID BM  ? insulin aspart  0-15 Units Subcutaneous TID WC  ? insulin aspart  0-5 Units Subcutaneous QHS  ? magnesium oxide  400 mg Oral Daily  ? mouth rinse  15 mL Mouth Rinse BID  ? metoprolol tartrate  50 mg Oral BID  ? multivitamin with minerals  1 tablet Oral Daily  ? nicotine  21 mg Transdermal Daily  ? rivaroxaban  20 mg Oral Q supper  ? rosuvastatin  20 mg Oral QHS  ? sodium chloride flush  3 mL Intravenous Q12H  ? ?Continuous Infusions: ? sodium chloride Stopped (05/16/21 1010)  ? sodium chloride    ? sodium chloride    ? amiodarone    ? amiodarone    ? ampicillin-sulbactam (UNASYN) IV 3 g (05/18/21 1051)  ? ?PRN Meds: ?sodium chloride, acetaminophen, docusate, labetalol, ondansetron (ZOFRAN) IV, polyethylene glycol, sodium chloride flush  ? ?Vital Signs  ?  ?Vitals:  ? 05/17/21 1530 05/17/21 2011 05/17/21 2358 05/18/21 0523  ?BP: 118/62 113/73 121/61 101/76  ?Pulse: 66 68 62 79  ?Resp: 16 19 16  (!) 21  ?Temp:  98 ?F (36.7 ?C) 98.3 ?F (36.8 ?C) 98.2 ?F (36.8 ?C)  ?TempSrc:  Oral Oral Oral  ?SpO2: 95% 95% 100% 100%  ?Weight:    67.5 kg  ?Height:      ? ? ?Intake/Output Summary (Last 24 hours) at 05/18/2021 1055 ?Last data filed at 05/18/2021 0246 ?Gross per 24 hour  ?Intake --  ?Output 1200 ml  ?Net -1200 ml  ? ? ?  05/18/2021  ?  5:23 AM 05/17/2021  ?  3:01 AM 05/16/2021  ?  6:00 AM  ?Last 3 Weights  ?Weight (lbs) 148 lb 14.4 oz 156 lb 1.6 oz 157 lb 3 oz  ?Weight (kg) 67.541 kg 70.806 kg 71.3 kg  ?  ? ?Telemetry  ?  ?NSR, intermittent atrial fib/flutter, rates 130s-rare 140s - Personally Reviewed ? ?Physical Exam  ? ?GEN: No acute distress.  ?HEENT: Normocephalic, atraumatic, sclera non-icteric. ?Neck: No  JVD  ?Cardiac: RRR on exam today, no murmurs, rubs, or gallops.  ?Respiratory: Clear to auscultation bilaterally. Breathing is unlabored. ?GI: Soft, nontender, non-distended, ?MS: no deformity.  Moves all extremities ?Extremities: No clubbing or cyanosis. No edema. Distal pedal pulses are 2+ and equal bilaterally. Right radial cath site without hematoma; good pulse.  ?Neuro:  AAOx3. Follows commands.  Normal gait ?Psych:  Responds to questions appropriately with a normal affect. ? ?Labs  ?  ?High Sensitivity Troponin:   ?Recent Labs  ?Lab 05/13/21 ?1452 05/13/21 ?1609 05/13/21 ?1748 05/14/21 ?[id number] 05/14/21 ?1036  ?TROPONINIHS 254* 641* 991* 563* 435*  ?   ? ?Cardiac EnzymesNo results for input(s): TROPONINI in the last 168 hours. No results for input(s): TROPIPOC in the last 168 hours.  ? ?Chemistry ?Recent Labs  ?Lab 05/12/21 ?[id number] 05/12/21 ?[id number] 05/15/21 ?[id number] 05/16/21 ?0532 05/17/21 ?0251  ?NA 137   < > 137 133* 137  ?K 2.7*   < > 3.4* 4.3 4.4  ?CL 104   < > 103 101 104  ?CO2 16*   < > 25 24 26   ?GLUCOSE 298*   < >  138* 106* 100*  ?BUN 16   < > 15 16 11   ?CREATININE 1.39*   < > 0.95 1.00 1.06  ?CALCIUM 9.1   < > 8.6* 8.9 9.4  ?PROT 6.8  --   --   --   --   ?ALBUMIN 3.2*  --   --   --   --   ?AST 26  --   --   --   --   ?ALT 10  --   --   --   --   ?ALKPHOS 78  --   --   --   --   ?BILITOT 0.6  --   --   --   --   ?GFRNONAA 56*   < > >60 >60 >60  ?ANIONGAP 17*   < > 9 8 7   ? < > = values in this interval not displayed.  ?  ? ?Hematology ?Recent Labs  ?Lab 05/16/21 ?0532 05/17/21 ?0251 05/18/21 ?0236  ?WBC 13.4* 11.8* 12.1*  ?RBC 4.21* 4.60 4.24  ?HGB 13.2 14.4 13.5  ?HCT 39.0 42.9 39.0  ?MCV 92.6 93.3 92.0  ?MCH 31.4 31.3 31.8  ?MCHC 33.8 33.6 34.6  ?RDW 13.2 13.0 12.7  ?PLT 248 246 265  ? ? ?BNP ?Recent Labs  ?Lab 05/12/21 ?0527  ?BNP 742.2*  ?  ? ?DDimer No results for input(s): DDIMER in the last 168 hours.  ? ?Radiology  ?  ?No results found. ? ?Cardiac Studies  ? ?Cath 05/15/21 ?  LV end diastolic pressure is  normal. ?  There is no aortic valve stenosis. ?  Mild, nonobstructive CAD. ?  ?Continue preventive therapy and management of his acute illness.  ?  ?He had intermittent AFib with RVR during the procedure.  He was in NSR when he left the procedure.   ? ?2D echo 05/12/21 ? ? 1. Extremely limited; essentially no parasternal views and limited views  ?otherwise; LV function appears to be normal using definity; suggest  ?cardiac MRI to better assess.  ? 2. Left ventricular ejection fraction, by estimation, is 60 to 65%. The  ?left ventricle has normal function. Left ventricular endocardial border  ?not optimally defined to evaluate regional wall motion. Left ventricular  ?diastolic function could not be  ?evaluated.  ? 3. Right ventricular systolic function is normal. The right ventricular  ?size is normal.  ? 4. The mitral valve was not well visualized. No evidence of mitral valve  ?regurgitation. No evidence of mitral stenosis.  ? 5. The aortic valve was not well visualized. Aortic valve regurgitation  ?Not well visualized.  ? 6. Pulmonic valve regurgitation Not well visualized.  ? 7. The inferior vena cava is normal in size with greater than 50%  ?respiratory variability, suggesting right atrial pressure of 3 mmHg.  ? ?Comparison(s): No prior Echocardiogram.  ? ?Patient Profile  ?   ?66 y.o. male with PAF, remote cryptogenic stroke, HLD, HTN, tobacco abuse. Last seen by cardiology in 2019. Presented with acute respiratory failure with hypoxia ultimately requiring intubation. Troponins elevated to a peak of 873. Has had intermittent atrial fib/flutter this admission and developed WCT treated with urgent cardioversion resulting in sinus tach. Was not taking previous rx meds prior to admission. ? ?Assessment & Plan  ?  ?1. Acute respiratory failure with hypoxia ?- resolved on RA, aspiration was a component ? ?2. Paroxysmal atrial fib/flutter, also with wide complex tachycardia ?Asymptomatic but with RVR ?- will do one days  of IV amiodarone  as per EP recs before; if improve PO transition tomorrow and potential DC ?-tolerating higher metoprolol dose ? ?3. Elevated troponin, likely due to demand ischemia ?- cath 05/15/21 with mild nonobstructive CAD, unlikely to be ischemic pulmonary edema ?- no ASA given concomitant Xarelto ?- LDL 179 fasting, started therapy 05/17/21 ?- likely demand ? ? ?Transition of care team consult placed for med assistance 05/16/21. ? ? ? ?For questions or updates, please contact [place] ?Please consult [email or web address] for contact info under Cardiology/STEMI. ? ?Signed, ?Werner Lean, MD ?05/18/2021, 10:55 AM   ? ?

## 2021-05-18 NOTE — Progress Notes (Signed)
?PROGRESS NOTE ? ? ? [name]  [id number] DOB: 02-Dec-1955 DOA: 05/12/2021 ?PCP: Harrison Mons, PA  ? ? ?Brief Narrative:  ?66 year old male with a history of PAF, hypertension, presented with respiratory failure secondary to flash pulmonary edema/pneumonia required mechanical ventilation.  Troponin peak up to 873.  Seen by cardiology and underwent cardiac cath which showed nonobstructive disease.  Hospital course complicated by development of wide-complex tachycardia, started on amiodarone.  He has since been extubated and heart rate has been stable.  Transferred to [facility] service on 4/1 ? ? ?Assessment & Plan: ?  ?Principal Problem: ?  Acute pulmonary edema ([place]) ?Active Problems: ?  Malnutrition of moderate degree ?  Wide-complex tachycardia ?  Respiratory distress ? ? ?Acute respiratory failure with hypoxia ?Secondary to aspiration pneumonia and possibly pulmonary edema ?Patient required mechanical ventilation from 3/27 to 3/30 ?Since extubation, his respiratory status has been stable ? ?Aspiration pneumonia ?He is on a course of Unasyn ?Continue pulmonary hygiene ? ?Hypertensive urgency with flash pulmonary edema ?Blood pressure currently stable ?Continue on metoprolol ? ?History of atrial fib ?Wide-complex tachycardia status post DCCV ?Cardiology following ?Treated with amiodarone ?Now on metoprolol ?Anticoagulated with Xarelto ?Underwent cardiac cath showed nonobstructive coronary disease ?Will need outpatient follow-up with EP and cardiology ?Started on amiodarone infusion ? ?Elevated troponin ?Felt to be demand ischemia ? ?Diarrhea ?Reports 3 loose stools today, mostly when he is passing urine ?Suspect this is related to antibiotics ?No fever or abdominal pain ?Imodium as needed ? ? ?DVT prophylaxis: SCDs Start: 05/12/21 0642 ?rivaroxaban (XARELTO) tablet 20 mg  ?Code Status: Full code ?Family Communication: Discussed with patient ?Disposition Plan: Status is: Inpatient ?Remains inpatient appropriate  because: Continued management of cardiac issues ? ? ? ? ?Consultants:  ?Cardiology ?Electrophysiology ?PCCM ? ?Procedures:  ?ETT 3/27> 3/30 ?Cardiac catheterization 3/30 ? ?Antimicrobials:  ?Unasyn 3/28>  ? ? ?Subjective: ?Denies any complaints.  Feels well. ? ?Objective: ?Vitals:  ? 05/17/21 2358 05/18/21 0523 05/18/21 0809 05/18/21 1500  ?BP: 121/61 101/76    ?Pulse: 62 79 63   ?Resp: 16 (!) 21 18 19   ?Temp: 98.3 ?F (36.8 ?C) 98.2 ?F (36.8 ?C)    ?TempSrc: Oral Oral    ?SpO2: 100% 100% 98%   ?Weight:  67.5 kg    ?Height:      ? ? ?Intake/Output Summary (Last 24 hours) at 05/18/2021 1925 ?Last data filed at 05/18/2021 0246 ?Gross per 24 hour  ?Intake --  ?Output 1200 ml  ?Net -1200 ml  ? ?Filed Weights  ? 05/16/21 0600 05/17/21 0301 05/18/21 0523  ?Weight: 71.3 kg 70.8 kg 67.5 kg  ? ? ?Examination: ? ?General exam: Appears calm and comfortable  ?Respiratory system: Clear to auscultation. Respiratory effort normal. ?Cardiovascular system: S1 & S2 heard, RRR. No JVD, murmurs, rubs, gallops or clicks. No pedal edema. ?Gastrointestinal system: Abdomen is nondistended, soft and nontender. No organomegaly or masses felt. Normal bowel sounds heard. ?Central nervous system: Alert and oriented. No focal neurological deficits. ?Extremities: Symmetric 5 x 5 power. ?Skin: No rashes, lesions or ulcers ?Psychiatry: Judgement and insight appear normal. Mood & affect appropriate.  ? ? ? ?Data Reviewed: I have personally reviewed following labs and imaging studies ? ?CBC: ?Recent Labs  ?Lab 05/12/21 ?[id number] 05/12/21 ?[id number] 05/14/21 ?0440 05/15/21 ?[id number] 05/16/21 ?0532 05/17/21 ?0251 05/18/21 ?0236  ?WBC 17.2*   < > 13.1* 10.9* 13.4* 11.8* 12.1*  ?NEUTROABS 6.9  --   --   --   --   --   --   ?  HGB 16.7   < > 13.2 13.8 13.2 14.4 13.5  ?HCT 51.8   < > 38.1* 39.2 39.0 42.9 39.0  ?MCV 98.9   < > 89.9 89.5 92.6 93.3 92.0  ?PLT 312   < > 244 237 248 246 265  ? < > = values in this interval not displayed.  ? ?Basic Metabolic Panel: ?Recent Labs   ?Lab 05/12/21 ?1653 05/13/21 ?0736 05/13/21 ?1452 05/13/21 ?1617 05/14/21 ?0411 05/14/21 ?0440 05/15/21 ?[id number] 05/16/21 ?0532 05/17/21 ?0251  ?NA  --  139 137   < > 139 135 137 133* 137  ?K  --  3.7 3.6   < > 3.5 3.3* 3.4* 4.3 4.4  ?CL  --  109 107  --   --  105 103 101 104  ?CO2  --  21* 19*  --   --  23 25 24 26   ?GLUCOSE  --  147* 201*  --   --  101* 138* 106* 100*  ?BUN  --  22 24*  --   --  19 15 16 11   ?CREATININE  --  1.24 1.21  --   --  1.03 0.95 1.00 1.06  ?CALCIUM  --  8.7* 8.8*  --   --  7.9* 8.6* 8.9 9.4  ?MG 1.9 1.8 2.0  --   --  2.1 1.8 1.9 2.0  ?PHOS 3.4 2.0* 5.3*  --   --  2.1* 2.5  --   --   ? < > = values in this interval not displayed.  ? ?GFR: ?Estimated Creatinine Clearance: 60.4 mL/min (by C-G formula based on SCr of 1.06 mg/dL). ?Liver Function Tests: ?Recent Labs  ?Lab 05/12/21 ?0527  ?AST 26  ?ALT 10  ?ALKPHOS 78  ?BILITOT 0.6  ?PROT 6.8  ?ALBUMIN 3.2*  ? ?No results for input(s): LIPASE, AMYLASE in the last 168 hours. ?No results for input(s): AMMONIA in the last 168 hours. ?Coagulation Profile: ?Recent Labs  ?Lab 05/12/21 ?0527  ?INR 1.0  ? ?Cardiac Enzymes: ?No results for input(s): CKTOTAL, CKMB, CKMBINDEX, TROPONINI in the last 168 hours. ?BNP (last 3 results) ?No results for input(s): PROBNP in the last 8760 hours. ?HbA1C: ?No results for input(s): HGBA1C in the last 72 hours. ?CBG: ?Recent Labs  ?Lab 05/17/21 ?1623 05/17/21 ?2018 05/18/21 ?0804 05/18/21 ?1230 05/18/21 ?1546  ?GLUCAP 97 116* 108* 93 87  ? ?Lipid Profile: ?Recent Labs  ?  05/17/21 ?0251  ?CHOL 242*  ?HDL 43  ?LDLCALC 179*  ?TRIG 101  ?CHOLHDL 5.6  ? ?Thyroid Function Tests: ?Recent Labs  ?  05/17/21 ?0251  ?TSH 4.002  ? ?Anemia Panel: ?No results for input(s): VITAMINB12, FOLATE, FERRITIN, TIBC, IRON, RETICCTPCT in the last 72 hours. ?Sepsis Labs: ?Recent Labs  ?Lab 05/12/21 ?[id number] 05/12/21 ?1155 05/13/21 ?0736 05/14/21 ?0440  ?PROCALCITON  --  0.73 0.67 0.53  ?LATICACIDVEN 7.7* 3.2*  --   --   ? ? ?Recent Results (from  the past 240 hour(s))  ?Blood Culture (routine x 2)     Status: None  ? Collection Time: 05/12/21  5:21 AM  ? Specimen: BLOOD RIGHT ARM  ?Result Value Ref Range Status  ? Specimen Description BLOOD RIGHT ARM  Final  ? Special Requests   Final  ?  BOTTLES DRAWN AEROBIC AND ANAEROBIC Blood Culture results may not be optimal due to an inadequate volume of blood received in culture bottles  ? Culture   Final  ?  NO GROWTH 5 DAYS ?Performed at [facility]  [facility], [place] [address], [place], [place] [address] ?  ? Report Status 05/17/2021 FINAL  Final  ?Resp Panel by RT-PCR (Flu A&B, Covid) Nasopharyngeal Swab     Status: None  ? Collection Time: 05/12/21  5:23 AM  ? Specimen: Nasopharyngeal Swab; Nasopharyngeal(NP) swabs in vial transport medium  ?Result Value Ref Range Status  ? SARS Coronavirus 2 by RT PCR NEGATIVE NEGATIVE Final  ?  Comment: (NOTE) ?SARS-CoV-2 target nucleic acids are NOT DETECTED. ? ?The SARS-CoV-2 RNA is generally detectable in upper respiratory ?specimens during the acute phase of infection. The lowest ?concentration of SARS-CoV-2 viral copies this assay can detect is ?138 copies/mL. A negative result does not preclude SARS-Cov-2 ?infection and should not be used as the sole basis for treatment or ?other patient management decisions. A negative result may occur with  ?improper specimen collection/handling, submission of specimen other ?than nasopharyngeal swab, presence of viral mutation(s) within the ?areas targeted by this assay, and inadequate number of viral ?copies(<138 copies/mL). A negative result must be combined with ?clinical observations, patient history, and epidemiological ?information. The expected result is Negative. ? ?Fact Sheet for Patients:  ?EntrepreneurPulse.com.au ? ?Fact Sheet for Healthcare Providers:  ?IncredibleEmployment.be ? ?This test is no t yet approved or cleared by the [place] FDA and  ?has been authorized for detection and/or  diagnosis of SARS-CoV-2 by ?FDA under an Emergency Use Authorization (EUA). This EUA will remain  ?in effect (meaning this test can be used) for the duration of the ?COVID-19 declaration under Section 564(b

## 2021-05-19 ENCOUNTER — Other Ambulatory Visit (HOSPITAL_COMMUNITY): Payer: Self-pay

## 2021-05-19 LAB — BASIC METABOLIC PANEL
Anion gap: 7 (ref 5–15)
BUN: 15 mg/dL (ref 8–23)
CO2: 24 mmol/L (ref 22–32)
Calcium: 9.1 mg/dL (ref 8.9–10.3)
Chloride: 105 mmol/L (ref 98–111)
Creatinine, Ser: 1.03 mg/dL (ref 0.61–1.24)
GFR, Estimated: >60 mL/min (ref >60)
Glucose, Bld: 113 mg/dL — ABNORMAL HIGH (ref 70–99)
Potassium: 3.9 mmol/L (ref 3.5–5.1)
Sodium: 136 mmol/L (ref 135–145)

## 2021-05-19 LAB — CBC
HCT: 38.5 % — ABNORMAL LOW (ref 39.0–52.0)
Hemoglobin: 12.9 g/dL — ABNORMAL LOW (ref 13.0–17.0)
MCH: 30.9 pg (ref 26.0–34.0)
MCHC: 33.5 g/dL (ref 30.0–36.0)
MCV: 92.1 fL (ref 80.0–100.0)
Platelets: 277 10*3/uL (ref 150–400)
RBC: 4.18 MIL/uL — ABNORMAL LOW (ref 4.22–5.81)
RDW: 12.6 % (ref 11.5–15.5)
WBC: 10.2 10*3/uL (ref 4.0–10.5)
nRBC: 0 % (ref 0.0–0.2)

## 2021-05-19 LAB — MAGNESIUM: Magnesium: 1.9 mg/dL (ref 1.7–2.4)

## 2021-05-19 LAB — GLUCOSE, CAPILLARY: Glucose-Capillary: 149 mg/dL — ABNORMAL HIGH (ref 70–99)

## 2021-05-19 MED ORDER — AMIODARONE HCL 200 MG PO TABS
200.0000 mg | ORAL_TABLET | Freq: Every day | ORAL | 1 refills | Status: DC
  Filled 2021-05-19: qty 30, 30d supply, fill #0

## 2021-05-19 MED ORDER — METOPROLOL TARTRATE 50 MG PO TABS
50.0000 mg | ORAL_TABLET | Freq: Two times a day (BID) | ORAL | 0 refills | Status: DC
  Filled 2021-05-19: qty 60, 30d supply, fill #0

## 2021-05-19 MED ORDER — ROSUVASTATIN CALCIUM 20 MG PO TABS
20.0000 mg | ORAL_TABLET | Freq: Every day | ORAL | 1 refills | Status: DC
  Filled 2021-05-19: qty 30, 30d supply, fill #0

## 2021-05-19 MED ORDER — RIVAROXABAN 20 MG PO TABS
20.0000 mg | ORAL_TABLET | Freq: Every day | ORAL | 1 refills | Status: DC
  Filled 2021-05-19: qty 30, 30d supply, fill #0

## 2021-05-19 MED ORDER — AMIODARONE HCL 200 MG PO TABS: 200.0000 mg | ORAL_TABLET | Freq: Every day | ORAL | Status: DC

## 2021-05-19 NOTE — TOC Initial Note (Signed)
Transition of Care (TOC) - Initial/Assessment Note  ? ? ?Patient Details  ?Name: [name] ?MRN: [id number] ?Date of Birth: [birth year] ? ?Transition of Care (TOC) CM/SW Contact:    ?Graves-Bigelow, Ocie Cornfield, RN ?Phone Number: ?05/19/2021, 10:23 AM ? ?Clinical Narrative:  Case Manager spoke with patient this morning regarding home health therapy. Patient declined home health services. No further needs identified by Case manager at this time.                ? ? ?Expected Discharge Plan: Home/Self Care ?Barriers to Discharge: No Barriers Identified ? ? ?Patient Goals and CMS Choice ?Patient states their goals for this hospitalization and ongoing recovery are:: to return home ?  ?Choice offered to / list presented to : NA ? ?Expected Discharge Plan and Services ?Expected Discharge Plan: Home/Self Care ?In-house Referral: NA ?Discharge Planning Services: CM Consult ?Post Acute Care Choice: NA ?Living arrangements for the past 2 months: [place] ?                ?  ?DME Agency: NA ?  ?  ?  ?HH Arranged: Refused HH ?  ?  ?  ?  ? ?Prior Living Arrangements/Services ?Living arrangements for the past 2 months: [place] ?Lives with:: Self ?Patient language and need for interpreter reviewed:: Yes ?       ?Need for Family Participation in Patient Care: No (Comment) ?Care giver support system in place?: No (comment) ?  ?Criminal Activity/Legal Involvement Pertinent to Current Situation/Hospitalization: No - Comment as needed ? ?Activities of Daily Living ?Home Assistive Devices/Equipment: None ?ADL Screening (condition at time of admission) ?Patient's cognitive ability adequate to safely complete daily activities?: Yes ?Is the patient deaf or have difficulty hearing?: No ?Does the patient have difficulty seeing, even when wearing glasses/contacts?: No ?Does the patient have difficulty concentrating, remembering, or making decisions?: No ?Patient able to express need for assistance with ADLs?: Yes ?Does the  patient have difficulty dressing or bathing?: No ?Independently performs ADLs?: Yes (appropriate for developmental age) ?Does the patient have difficulty walking or climbing stairs?: Yes ?Weakness of Legs: Left ?Weakness of Arms/Hands: None ? ?Permission Sought/Granted ?Permission sought to share information with : Case Manager ?  ?  ?Emotional Assessment ?Appearance:: Appears stated age ?Attitude/Demeanor/Rapport: Angry ?Affect (typically observed): Frustrated ?Orientation: : Oriented to Situation, Oriented to  Time, Oriented to Place, Oriented to Self ?Alcohol / Substance Use: Not Applicable ?Psych Involvement: No (comment) ? ?Admission diagnosis:  Acute pulmonary edema ([place]) [J81.0] ?Respiratory distress [R06.03] ?Patient Active Problem List  ? Diagnosis Date Noted  ? Wide-complex tachycardia   ? Respiratory distress   ? Acute pulmonary edema ([place]) 05/12/2021  ? Malnutrition of moderate degree 05/12/2021  ? Protrusion of lumbar intervertebral disc 12/10/2017  ? Chronic left-sided low back pain with left-sided sciatica 11/22/2017  ? Adhesive capsulitis of left shoulder 08/02/2017  ? Pain in left leg 08/02/2017  ? Gout 05/12/2017  ? Chronic left shoulder pain 06/13/2015  ? Neuropathic pain of shoulder 06/13/2015  ? Acromioclavicular joint arthritis 03/26/2015  ? PAF (paroxysmal atrial fibrillation) ([place]) 03/20/2015  ? Leukocytosis 03/20/2015  ? Hematemesis 03/19/2015  ? 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 Hemiparesis and alteration of sensations as late effects of stroke ([place]) 10/29/2014  ? Hypersomnia with sleep apnea 10/29/2014  ? Nicotine use disorder 10/29/2014  ? Left anterior shoulder pain 10/23/2014  ? Atrial fibrillation ([place]) 09/06/2014  ? Left spastic hemiparesis ([place]) 08/07/2014  ? Left-sided  neglect 08/07/2014  ? History of stroke 08/01/2014  ? Tobacco dependence   ? Carotid artery occlusion with infarction [facility])   ? History of ETOH abuse   ? Hyperlipidemia   ? Essential hypertension, benign  10/11/2013  ? ?PCP:  Harrison Mons, PA ?Pharmacy:   ?[facility] ) - [place], [place] ?[address] ?[place] [address] ?Phone: [phone] Fax: [phone] ? ?[facility] ?[address] ?[place] [address] ?Phone: [phone] Fax: [phone] ? ? ?Readmission Risk Interventions ?   ? View : No data to display.  ?  ?  ?  ? ? ? ?

## 2021-05-19 NOTE — Care Management Important Message (Signed)
Important Message ? ?Patient Details  ?Name: [name] ?MRN: [id number] ?Date of Birth: 08-Nov-1955 ? ? ?Medicare Important Message Given:  Yes ? ? ? ? ?Renie Ora ?05/19/2021, 9:11 AM ?

## 2021-05-19 NOTE — Progress Notes (Signed)
? ? ?Progress Note ? ?Patient Name: [name] ?Date of Encounter: 05/19/2021 ? ?Primary Cardiologist: Sinclair Grooms, MD ? ?Subjective  ? ?Overnight has SR bradycardia on amiodarone IV and this was held. ?No CP, no palpitations. No SOB.  He is very eager to leave.  Asymptomatic on heart rates 55-60s, able to ambulate without issue. ? ?Inpatient Medications  ?  ?Scheduled Meds: ? [START ON 05/20/2021] amiodarone  200 mg Oral Daily  ? feeding supplement  237 mL Oral TID BM  ? insulin aspart  0-15 Units Subcutaneous TID WC  ? insulin aspart  0-5 Units Subcutaneous QHS  ? magnesium oxide  400 mg Oral Daily  ? metoprolol tartrate  50 mg Oral BID  ? multivitamin with minerals  1 tablet Oral Daily  ? nicotine  21 mg Transdermal Daily  ? rivaroxaban  20 mg Oral Q supper  ? rosuvastatin  20 mg Oral QHS  ? sodium chloride flush  3 mL Intravenous Q12H  ? ?Continuous Infusions: ? sodium chloride    ? ?PRN Meds: ?sodium chloride, acetaminophen, docusate, labetalol, loperamide, ondansetron (ZOFRAN) IV, polyethylene glycol, sodium chloride flush  ? ?Vital Signs  ?  ?Vitals:  ? 05/18/21 2150 05/19/21 0022 05/19/21 0520 05/19/21 0729  ?BP: (!) 113/57 107/67 132/68 100/62  ?Pulse: 61 64 (!) 57 66  ?Resp: 20 18 20 20   ?Temp: (!) 97.5 ?F (36.4 ?C) 98.4 ?F (36.9 ?C) 97.6 ?F (36.4 ?C) (!) 97.2 ?F (36.2 ?C)  ?TempSrc: Oral Oral Oral Oral  ?SpO2: 96% 93% 94%   ?Weight:   69 kg   ?Height:      ? ? ?Intake/Output Summary (Last 24 hours) at 05/19/2021 1033 ?Last data filed at 05/19/2021 0600 ?Gross per 24 hour  ?Intake 824.48 ml  ?Output --  ?Net 824.48 ml  ? ? ?  05/19/2021  ?  5:20 AM 05/18/2021  ?  5:23 AM 05/17/2021  ?  3:01 AM  ?Last 3 Weights  ?Weight (lbs) 152 lb 3.2 oz 148 lb 14.4 oz 156 lb 1.6 oz  ?Weight (kg) 69.037 kg 67.541 kg 70.806 kg  ?  ? ?Telemetry  ?  ?AF RVR 2000 (intermitteint-> SR to sinus bradycardia with no intermittent sx - Personally Reviewed ? ?Physical Exam  ? ?GEN: No acute distress.  ?HEENT: Normocephalic, atraumatic,  sclera non-icteric. ?Neck: No JVD  ?Cardiac: RRR on exam today, no murmurs, rubs, or gallops.  ?Respiratory: Clear to auscultation bilaterally. Breathing is unlabored. ?GI: Soft, nontender, non-distended, ?MS: no deformity.  Moves all extremities ?Extremities: No clubbing or cyanosis. No edema. Distal pedal pulses are 2+ and equal bilaterally. Right radial cath site without hematoma; good pulse.  ?Neuro:  AAOx3. Follows commands.  Normal gait ?Psych:  Anxious mood and affect ? ?Labs  ?  ?High Sensitivity Troponin:   ?Recent Labs  ?Lab 05/13/21 ?1452 05/13/21 ?1609 05/13/21 ?1748 05/14/21 ?[id number] 05/14/21 ?1036  ?TROPONINIHS 254* 641* 991* 563* 435*  ?   ? ?Cardiac EnzymesNo results for input(s): TROPONINI in the last 168 hours. No results for input(s): TROPIPOC in the last 168 hours.  ? ?Chemistry ?Recent Labs  ?Lab 05/16/21 ?0532 05/17/21 ?[id number] 05/19/21 ?0207  ?NA 133* 137 136  ?K 4.3 4.4 3.9  ?CL 101 104 105  ?CO2 24 26 24   ?GLUCOSE 106* 100* 113*  ?BUN 16 11 15   ?CREATININE 1.00 1.06 1.03  ?CALCIUM 8.9 9.4 9.1  ?GFRNONAA >60 >60 >60  ?ANIONGAP 8 7 7   ?  ? ?Hematology ?Recent  Labs  ?Lab 05/17/21 ?0251 05/18/21 ?0236 05/19/21 ?0207  ?WBC 11.8* 12.1* 10.2  ?RBC 4.60 4.24 4.18*  ?HGB 14.4 13.5 12.9*  ?HCT 42.9 39.0 38.5*  ?MCV 93.3 92.0 92.1  ?MCH 31.3 31.8 30.9  ?MCHC 33.6 34.6 33.5  ?RDW 13.0 12.7 12.6  ?PLT 246 265 277  ? ? ?BNP ?No results for input(s): BNP, PROBNP in the last 168 hours. ?  ? ?DDimer No results for input(s): DDIMER in the last 168 hours.  ? ?Radiology  ?  ?No results found. ? ?Cardiac Studies  ? ?Cath 05/15/21 ?  LV end diastolic pressure is normal. ?  There is no aortic valve stenosis. ?  Mild, nonobstructive CAD. ?  ?Continue preventive therapy and management of his acute illness.  ?  ?He had intermittent AFib with RVR during the procedure.  He was in NSR when he left the procedure.   ? ?2D echo 05/12/21 ? ? 1. Extremely limited; essentially no parasternal views and limited views  ?otherwise; LV  function appears to be normal using definity; suggest  ?cardiac MRI to better assess.  ? 2. Left ventricular ejection fraction, by estimation, is 60 to 65%. The  ?left ventricle has normal function. Left ventricular endocardial border  ?not optimally defined to evaluate regional wall motion. Left ventricular  ?diastolic function could not be  ?evaluated.  ? 3. Right ventricular systolic function is normal. The right ventricular  ?size is normal.  ? 4. The mitral valve was not well visualized. No evidence of mitral valve  ?regurgitation. No evidence of mitral stenosis.  ? 5. The aortic valve was not well visualized. Aortic valve regurgitation  ?Not well visualized.  ? 6. Pulmonic valve regurgitation Not well visualized.  ? 7. The inferior vena cava is normal in size with greater than 50%  ?respiratory variability, suggesting right atrial pressure of 3 mmHg.  ? ?Comparison(s): No prior Echocardiogram.  ? ?Patient Profile  ?   ?66 y.o. male with PAF, remote cryptogenic stroke, HLD, HTN, tobacco abuse. Last seen by cardiology in 2019. Presented with acute respiratory failure with hypoxia ultimately requiring intubation. Troponins elevated to a peak of 873. Has had intermittent atrial fib/flutter this admission and developed WCT treated with urgent cardioversion resulting in sinus tach. Was not taking previous rx meds prior to admission. ? ?Assessment & Plan  ?  ?1. Acute respiratory failure with hypoxia ?- resolved on RA, aspiration was a component ? ?2. Paroxysmal atrial fib/flutter, also with wide complex tachycardia ?- amiodarone 200 mg PO daily, Xarelto, and metoprolol 50 mg PO daily ? ?3. Elevated troponin, likely due to demand ischemia ?- cath 05/15/21 with mild nonobstructive CAD, unlikely to be ischemic pulmonary edema ?- LDL 179 fasting, started therapy 05/17/21 ? ?Transition of care team consult placed for med assistance 05/16/21. ? ?[facility] will sign off.   ?Medication Recommendations:  BB, Amiodarone, and  DOAC as above ?Other recommendations (labs, testing, etc):  Lipids, TSH and LFTs as outpatient ?Follow up as an outpatient:  Has 06/30/21 follow up with 06/09/21 PCP visit ? ? ? ?For questions or updates, please contact [facility] ?Please consult [email or web address] for contact info under Cardiology/STEMI. ? ?Signed, ?Werner Lean, MD ?05/19/2021, 10:33 AM   ? ?

## 2021-05-19 NOTE — Progress Notes (Signed)
Physical Therapy Treatment/ Discharge ?Patient Details ?Name: [name] ?MRN: [id number] ?DOB: 09-Feb-1956 ?Today's Date: 05/19/2021 ? ? ?History of Present Illness 66 yo male adm 3/27 with acute hypoxic respiratory failure and acute pulmonary edema. Intubated 3/27-3/28, 3/28-30.  S/P LHC 3/30. PMHx - Afib, HTN, Rt CVA ? ?  ?PT Comments  ? ? Pt pleasant and eager to return home. PT without SOB throughout session with HR 64-74 and steady gait. PT slightly impulsive but moving without LOB with weakness. Pt has met all goals and will sign off at this time.    ?Recommendations for follow up therapy are one component of a multi-disciplinary discharge planning process, led by the attending physician.  Recommendations may be updated based on patient status, additional functional criteria and insurance authorization. ? ?Follow Up Recommendations ? No PT follow up ?  ?  ?Assistance Recommended at Discharge PRN  ?Patient can return home with the following Assistance with cooking/housework;Assist for transportation ?  ?Equipment Recommendations ? None recommended by PT  ?  ?Recommendations for Other Services   ? ? ?  ?Precautions / Restrictions Precautions ?Precautions: Fall ?Precaution Comments: monitor O2, cardiac cath 3/30 ?Restrictions ?Weight Bearing Restrictions: No  ?  ? ?Mobility ? Bed Mobility ?Overal bed mobility: Modified Independent ?  ?  ?  ?  ?  ?  ?  ?  ? ?Transfers ?Overall transfer level: Modified independent ?  ?  ?  ?  ?  ?  ?  ?  ?  ?  ? ?Ambulation/Gait ?Ambulation/Gait assistance: Modified independent (Device/Increase time) ?Gait Distance (Feet): 400 Feet ?Assistive device: None ?Gait Pattern/deviations: Step-through pattern, Decreased stride length ?  ?Gait velocity interpretation: >4.37 ft/sec, indicative of normal walking speed ?  ?General Gait Details: pt with decreased arm swing due to LUE weakness baseline, no LOB with hall ambulation or stairs ? ? ?Stairs ?Stairs: Yes ?Stairs assistance: Modified  independent (Device/Increase time) ?Stair Management: Alternating pattern, Forwards, Step to pattern ?Number of Stairs: 11 ?General stair comments: pt able to ascend alternating with and without rail and descent with step to pattern also with and without rail ? ? ?Wheelchair Mobility ?  ? ?Modified Rankin (Stroke Patients Only) ?  ? ? ?  ?Balance Overall balance assessment: Mild deficits observed, not formally tested ?  ?  ?  ?  ?  ?  ?  ?  ?  ?  ?  ?  ?  ?  ?  ?  ?  ?  ?  ? ?  ?Cognition Arousal/Alertness: Awake/alert ?Behavior During Therapy: [facility] for tasks assessed/performed ?Overall Cognitive Status: Within Functional Limits for tasks assessed ?  ?  ?  ?  ?  ?  ?  ?  ?  ?  ?  ?  ?  ?  ?  ?  ?  ?  ?  ? ?  ?Exercises   ? ?  ?General Comments General comments (skin integrity, edema, etc.): pt reports daughter plans to assist with meds, educated pt/rn on recommendations to have daughter assist with driving as well. ?  ?  ? ?Pertinent Vitals/Pain Pain Assessment ?Pain Assessment: No/denies pain  ? ? ?Home Living   ?  ?  ?  ?  ?  ?  ?  ?  ?  ?   ?  ?Prior Function    ?  ?  ?   ? ?PT Goals (current goals can now be found in the care plan section) Progress towards PT goals:  Goals met/education completed, patient discharged from PT ? ?  ?Frequency ? ? ?   ? ? ? ?  ?PT Plan Discharge plan needs to be updated  ? ? ?Co-evaluation   ?  ?  ?  ?  ? ?  ?AM-PAC PT "6 Clicks" Mobility   ?Outcome Measure ? Help needed turning from your back to your side while in a flat bed without using bedrails?: None ?Help needed moving from lying on your back to sitting on the side of a flat bed without using bedrails?: None ?Help needed moving to and from a bed to a chair (including a wheelchair)?: None ?Help needed standing up from a chair using your arms (e.g., wheelchair or bedside chair)?: None ?Help needed to walk in hospital room?: None ?Help needed climbing 3-5 steps with a railing? : None ?6 Click Score: 24 ? ?  ?End of Session    ?Activity Tolerance: Patient tolerated treatment well ?Patient left: in bed;with call bell/phone within reach (EOB) ?Nurse Communication: Mobility status ?PT Visit Diagnosis: Other abnormalities of gait and mobility (R26.89) ?  ? ? ?Time: 9957-9009 ?PT Time Calculation (min) (ACUTE ONLY): 9 min ? ?Charges:  $Gait Training: 8-22 mins          ?          ? ?Antionetta Ator P, PT ?Acute Rehabilitation Services ?Pager: [phone] ?Office: [phone] ? ? ? ?Gurtej Noyola B Tanyiah Laurich ?05/19/2021, 11:50 AM ? ?

## 2021-05-19 NOTE — Discharge Summary (Signed)
Physician Discharge Summary  ?[name] [id number] DOB: 24-Nov-1955 DOA: 05/12/2021 ? ?PCP: Harrison Mons, PA ? ?Admit date: 05/12/2021 ?Discharge date: 05/19/2021 ? ?Admitted From: home ?Disposition:  home ? ?Recommendations for Outpatient Follow-up:  ?Follow up with PCP in 1-2 weeks ?Please obtain BMP/CBC in one week ?Follow up scheduled with cardiology ? ? ?Discharge Condition:stable ?CODE STATUS:full code ?Diet recommendation: heart healthy ? ?Brief/Interim Summary: ?66 year old male with a history of PAF, hypertension, presented with respiratory failure secondary to flash pulmonary edema/pneumonia required mechanical ventilation.  Troponin peak up to 873.  Seen by cardiology and underwent cardiac cath which showed nonobstructive disease.  Hospital course complicated by development of wide-complex tachycardia, started on amiodarone.  He has since been extubated and heart rate has been stable.  Transferred to [facility] service on 4/1 ? ?Discharge Diagnoses:  ?Principal Problem: ?  Acute pulmonary edema ([place]) ?Active Problems: ?  Malnutrition of moderate degree ?  Wide-complex tachycardia ?  Respiratory distress ? ?Acute respiratory failure with hypoxia ?Secondary to aspiration pneumonia and possibly pulmonary edema ?Patient required mechanical ventilation from 3/27 to 3/30 ?Since extubation, his respiratory status has been stable ?  ?Aspiration pneumonia ?Completed a course of unasyn ?Continue pulmonary hygiene ?  ?Hypertensive urgency with flash pulmonary edema ?Blood pressure currently stable ?Continue on metoprolol ?  ?History of atrial fib ?Wide-complex tachycardia status post DCCV ?Cardiology following ?Treated with amiodarone ?Now on metoprolol and oral amiodarone ?Anticoagulated with Xarelto ?Underwent cardiac cath showed nonobstructive coronary disease ?Will need outpatient follow-up with EP and cardiology ?HR has been stable ? ?  ?Elevated troponin ?Felt to be demand ischemia ?  ?Diarrhea ?Suspect this is  related to antibiotics ?No fever or abdominal pain ?Now resolved ? ?Discharge Instructions ? ?Discharge Instructions   ? ? Diet - low sodium heart healthy   Complete by: As directed ?  ? Increase activity slowly   Complete by: As directed ?  ? ?  ? ?Allergies as of 05/19/2021   ?No Known Allergies ?  ? ?  ?Medication List  ?  ? ?STOP taking these medications   ? ?atorvastatin 80 MG tablet ?Commonly known as: LIPITOR ?  ?buPROPion 150 MG 24 hr tablet ?Commonly known as: WELLBUTRIN XL ?  ?colchicine 0.6 MG tablet ?  ?ezetimibe 10 MG tablet ?Commonly known as: ZETIA ?  ?gabapentin 300 MG capsule ?Commonly known as: NEURONTIN ?  ?lisinopril 5 MG tablet ?Commonly known as: ZESTRIL ?  ?sertraline 100 MG tablet ?Commonly known as: ZOLOFT ?  ? ?  ? ?TAKE these medications   ? ?amiodarone 200 MG tablet ?Commonly known as: PACERONE ?Take 1 tablet (200 mg total) by mouth daily. ?Start taking on: May 20, 2021 ?  ?busPIRone 5 MG tablet ?Commonly known as: BUSPAR ?Take 5 mg by mouth 3 (three) times daily. ?  ?metoprolol tartrate 50 MG tablet ?Commonly known as: LOPRESSOR ?Take 1 tablet (50 mg total) by mouth 2 (two) times daily. ?  ?rosuvastatin 20 MG tablet ?Commonly known as: CRESTOR ?Take 1 tablet (20 mg total) by mouth at bedtime. ?  ?Xarelto 20 MG Tabs tablet ?Generic drug: rivaroxaban ?Take 1 tablet (20 mg total) by mouth daily with supper. ?What changed:  ?how much to take ?how to take this ?when to take this ?additional instructions ?  ? ?  ? ? Follow-up Information   ? ? Constance Haw, MD Follow up on 06/30/2021.   ?Specialty: Cardiology ?Contact information: ?[address] ?[address] ?[place] [address] ?[phone] ? ? ?  ?  ? ?  Harrison Mons, PA Follow up on 06/09/2021.   ?Specialty: Family Medicine ?Contact information: ?[address] ?[place] [address] ?[phone] ? ? ?  ?  ? ?  ?  ? ?  ? ?No Known Allergies ? ?Consultations: ?Cardiology ?EP ?PCCM ? ? ?Procedures/Studies: ?CARDIAC  CATHETERIZATION ? ?Result Date: 05/15/2021 ?  LV end diastolic pressure is normal.   There is no aortic valve stenosis.   Mild, nonobstructive CAD. Continue preventive therapy and management of his acute illness. He had intermittent AFib with RVR during the procedure.  He was in NSR when he left the procedure.   ? ?DG CHEST PORT 1 VIEW ? ?Result Date: 05/13/2021 ?CLINICAL DATA:  Central line placement. EXAM: PORTABLE CHEST 1 VIEW COMPARISON:  May 13, 2021 (3:55 p.m.) FINDINGS: Since the prior study there is been interval placement of a left-sided venous catheter. Its distal tip sits within the superior vena cava, approximally 2.4 cm proximal to the junction of the superior vena cava and right atrium. Stable endotracheal tube and nasogastric tube positioning is noted. Stable diffuse bilateral airspace disease is seen. There is no evidence of a pleural effusion or pneumothorax. The heart size and mediastinal contours are within normal limits. Degenerative changes seen throughout the thoracic spine. IMPRESSION: Interval left-sided venous catheter placement and positioning, as described above, without additional significant interval changes. Electronically Signed   By: Virgina Norfolk M.D.   On: 05/13/2021 19:42  ? ?DG Chest Port 1 View ? ?Result Date: 05/13/2021 ?CLINICAL DATA:  Encounter for intubation EXAM: PORTABLE CHEST 1 VIEW COMPARISON:  05/13/2021 FINDINGS: Endotracheal tube in good position. NG tube tip not visualized off the lower edge of the image. Progression of symmetric and diffuse bilateral airspace disease most likely pulmonary edema. Bibasilar atelectasis. Lung bases not included IMPRESSION: Endotracheal tube in good position. Interval development of diffuse bilateral airspace disease consistent with pulmonary edema. Electronically Signed   By: Franchot Gallo M.D.   On: 05/13/2021 16:08  ? ?DG Chest Port 1 View ? ?Result Date: 05/13/2021 ?CLINICAL DATA:  Respiratory distress. EXAM: PORTABLE CHEST 1 VIEW  COMPARISON:  May 12, 2021. FINDINGS: The heart size and mediastinal contours are within normal limits. Both lungs are clear. Endotracheal and nasogastric tubes are unchanged. The visualized skeletal structures are unremarkable. IMPRESSION: Stable support apparatus. No acute cardiopulmonary abnormality seen. Electronically Signed   By: Marijo Conception M.D.   On: 05/13/2021 07:37  ? ?DG CHEST PORT 1 VIEW ? ?Result Date: 05/12/2021 ?CLINICAL DATA:  Difficulty breathing EXAM: PORTABLE CHEST 1 VIEW COMPARISON:  Previous studies including the examination done earlier today FINDINGS: Transverse diameter of heart is slightly increased. Tip of endotracheal tube is proximally 5.9 cm above the carina. Enteric tube is noted traversing the esophagus. There is interval decrease in pulmonary vascular congestion and pulmonary edema. Still, there is residual prominence of interstitial and alveolar markings in the parahilar regions and lower lung fields consistent with residual pulmonary edema. Costophrenic angles are clear. There is no pneumothorax. IMPRESSION: Partial clearing of pulmonary edema. Electronically Signed   By: Elmer Picker M.D.   On: 05/12/2021 14:17  ? ?DG Chest Port 1 View ? ?Result Date: 05/12/2021 ?CLINICAL DATA:  Possible sepsis. EXAM: PORTABLE CHEST 1 VIEW COMPARISON:  11/02/2019 FINDINGS: 0532 hours. Endotracheal tube tip is 4.9 cm above the base of the carina. The NG tube passes into the stomach with proximal side port below the GE junction. Diffuse hazy bilateral airspace disease noted without substantial pleural  effusion. Cardiopericardial silhouette is at upper limits of normal for size. Telemetry leads overlie the chest. IMPRESSION: 1. Endotracheal tube tip is 4.9 cm above the base of the carina. 2. Diffuse hazy bilateral airspace disease. Imaging features suggest pulmonary edema although diffuse infection could have this appearance. Electronically Signed   By: Misty Stanley M.D.   On: 05/12/2021  05:49  ? ?DG Abd Portable 1V ? ?Result Date: 05/13/2021 ?CLINICAL DATA:  Orogastric tube placement EXAM: PORTABLE ABDOMEN - 1 VIEW COMPARISON:  Portable exam 1556 hours bowel priors for comparison FINDINGS: T

## 2021-05-19 NOTE — Progress Notes (Signed)
Occupational Therapy Treatment ?Patient Details ?Name: [name] ?MRN: [id number] ?DOB: [birth year] ?Today's Date: 05/19/2021 ? ? ?History of present illness 66 yo male adm 3/27 with acute hypoxic respiratory failure and acute pulmonary edema. Intubated 3/27-3/28, 3/28-30.  S/P LHC 3/30. PMHx - Afib, HTN, Rt CVA ?  ?OT comments ? Patient in room upon entry, dressed and eager for DC home.  Patient completing mobility around room with supervision.  Focus of session is pill box test. Pt failed pill box test, demonstrating poor planning, mental flexibility, suboptimal search strategies, concrete thinking, attention, problem solving, organization, and the inability to multitask. Pt had a total of 23 errors, where more than 3 errors is considered a fail. Discussed deficits with pt, who reports he plans to have his daughter assist with medication mgmt at home. Reviewed recommendations for no driving due to executive function deficits, and pt voiced understanding (spoke to RN to relay to daughter as well). Will follow acutely, continued to recommend [facility] services.   ? ?Recommendations for follow up therapy are one component of a multi-disciplinary discharge planning process, led by the attending physician.  Recommendations may be updated based on patient status, additional functional criteria and insurance authorization. ?   ?Follow Up Recommendations ? Home health OT  ?  ?Assistance Recommended at Discharge Intermittent Supervision/Assistance  ?Patient can return home with the following ? Assistance with cooking/housework;Assist for transportation;Help with stairs or ramp for entrance;Direct supervision/assist for medications management;Direct supervision/assist for financial management;A little help with walking and/or transfers;A little help with bathing/dressing/bathroom ?  ?Equipment Recommendations ? BSC/3in1  ?  ?Recommendations for Other Services   ? ?  ?Precautions / Restrictions Precautions ?Precautions:  Fall ?Precaution Comments: monitor O2, cardiac cath 3/30 ?Restrictions ?Weight Bearing Restrictions: No  ? ? ?  ? ?Mobility Bed Mobility ?  ?  ?  ?  ?  ?  ?  ?  ?  ? ?Transfers ?  ?  ?  ?  ?  ?  ?  ?  ?  ?  ?  ?  ?Balance   ?  ?  ?  ?  ?  ?  ?  ?  ?  ?  ?  ?  ?  ?  ?  ?  ?  ?  ?   ? ?ADL either performed or assessed with clinical judgement  ? ?ADL   ?  ?  ?  ?  ?  ?  ?  ?  ?  ?  ?  ?  ?  ?  ?  ?  ?  ?  ?Functional mobility during ADLs: Supervision/safety ?General ADL Comments: pt sitting in chair upon entry, mobility around room with supervision to EOB to engage in session.  Pt fully dressed upon entry. ?  ? ?Extremity/Trunk Assessment   ?  ?  ?  ?  ?  ? ?Vision   ?  ?  ?Perception   ?  ?Praxis   ?  ? ?Cognition Arousal/Alertness: Awake/alert ?Behavior During Therapy: [facility] for tasks assessed/performed ?Overall Cognitive Status: No family/caregiver present to determine baseline cognitive functioning ?Area of Impairment: Memory, Safety/judgement, Awareness, Problem solving, Attention ?  ?  ?  ?  ?  ?  ?  ?  ?  ?Current Attention Level: Sustained ?Memory: Decreased short-term memory ?  ?Safety/Judgement: Decreased awareness of safety ?Awareness: Emergent ?Problem Solving: Slow processing, Difficulty sequencing, Requires verbal cues ?General Comments: completed pill box test- failed, see below.  Discussed recommendations for assist  with meds, driving and all other IADLs. ?  ?General Comments: Assessed using the Pill Box Test. Pt failed the assessment, demonstrating poor planning, mental flexibility, suboptimal search strategies, concrete thinking and the inability to multitask. Pt had a total of 23 errors, where more than 3 errors is considered a fail.  ?Errors: ?One tablet 3x/day (yellow) - 14 errors (omission/misplacement) ?One tablet 2x/day with breakfast and dinner (green) - 7 errors (omission/misplacement) ?One tablet in the morning (Blue)- 5 errors (omission/misplacement) ?One tablet daily at bedtime (orange) - 0  errors(omission/misplacement) ?One tablet every other day (red) - 3 errors (omission/misplacement) ?  ?Number of misplaced movement errors (pills placed in incorrect compartment)- 16 ?Number of total errors (sum of omissions; misplacements) - 23 ?Total time to complete task (allowed 5 min) - 7 min ?  ?   ?Exercises   ? ?  ?Shoulder Instructions   ? ? ?  ?General Comments pt reports daughter plans to assist with meds, educated pt/rn on recommendations to have daughter assist with driving as well.  ? ? ?Pertinent Vitals/ Pain       Pain Assessment ?Pain Assessment: No/denies pain ? ?Home Living   ?  ?  ?  ?  ?  ?  ?  ?  ?  ?  ?  ?  ?  ?  ?  ?  ?  ?  ? ?  ?Prior Functioning/Environment    ?  ?  ?  ?   ? ?Frequency ? Min 2X/week  ? ? ? ? ?  ?Progress Toward Goals ? ?OT Goals(current goals can now be found in the care plan section) ? Progress towards OT goals: Progressing toward goals ? ?Acute Rehab OT Goals ?Patient Stated Goal: get home ?OT Goal Formulation: With patient ?Time For Goal Achievement: 05/30/21 ?Potential to Achieve Goals: Good  ?Plan Discharge plan remains appropriate;Frequency remains appropriate   ? ?Co-evaluation ? ? ?   ?  ?  ?  ?  ? ?  ?AM-PAC OT "6 Clicks" Daily Activity     ?Outcome Measure ? ? Help from another person eating meals?: A Little ?Help from another person taking care of personal grooming?: A Little ?Help from another person toileting, which includes using toliet, bedpan, or urinal?: A Little ?Help from another person bathing (including washing, rinsing, drying)?: A Little ?Help from another person to put on and taking off regular upper body clothing?: A Little ?Help from another person to put on and taking off regular lower body clothing?: A Little ?6 Click Score: 18 ? ?  ?End of Session   ? ?OT Visit Diagnosis: Unsteadiness on feet (R26.81);Other symptoms and signs involving cognitive function ?  ?Activity Tolerance Patient tolerated treatment well ?  ?Patient Left in bed;with call  bell/phone within reach ?  ?Nurse Communication Mobility status ?  ? ?   ? ?Time: [id number] ?OT Time Calculation (min): 24 min ? ?Charges: OT General Charges ?$OT Visit: 1 Visit ?OT Treatments ?$Self Care/Home Management : 23-37 mins ? ?Jolaine Artist, OT ?Acute Rehabilitation Services ?Pager [phone] ?Office [phone] ? ? ?Delight Stare ?05/19/2021, 11:40 AM ?

## 2021-05-25 ENCOUNTER — Encounter (HOSPITAL_COMMUNITY): Payer: Self-pay | Admitting: *Deleted

## 2021-05-25 ENCOUNTER — Other Ambulatory Visit: Payer: Self-pay

## 2021-05-25 ENCOUNTER — Inpatient Hospital Stay (HOSPITAL_COMMUNITY)
Admission: EM | Admit: 2021-05-25 | Discharge: 2021-05-28 | DRG: 280 | Disposition: A | Discharge status: Home / Self Care | Payer: Medicare Other | Attending: Family Medicine | Admitting: Family Medicine

## 2021-05-25 ENCOUNTER — Emergency Department (HOSPITAL_COMMUNITY): Payer: Medicare Other

## 2021-05-25 DIAGNOSIS — I214 Non-ST elevation (NSTEMI) myocardial infarction: Secondary | ICD-10-CM | POA: Diagnosis present

## 2021-05-25 DIAGNOSIS — Z79899 Other long term (current) drug therapy: Secondary | ICD-10-CM

## 2021-05-25 DIAGNOSIS — Z716 Tobacco abuse counseling: Secondary | ICD-10-CM

## 2021-05-25 DIAGNOSIS — Z8249 Family history of ischemic heart disease and other diseases of the circulatory system: Secondary | ICD-10-CM

## 2021-05-25 DIAGNOSIS — I440 Atrioventricular block, first degree: Secondary | ICD-10-CM | POA: Diagnosis not present

## 2021-05-25 DIAGNOSIS — M1990 Unspecified osteoarthritis, unspecified site: Secondary | ICD-10-CM | POA: Diagnosis present

## 2021-05-25 DIAGNOSIS — I110 Hypertensive heart disease with heart failure: Principal | ICD-10-CM | POA: Diagnosis present

## 2021-05-25 DIAGNOSIS — F419 Anxiety disorder, unspecified: Secondary | ICD-10-CM

## 2021-05-25 DIAGNOSIS — F17210 Nicotine dependence, cigarettes, uncomplicated: Secondary | ICD-10-CM | POA: Diagnosis present

## 2021-05-25 DIAGNOSIS — R001 Bradycardia, unspecified: Secondary | ICD-10-CM | POA: Diagnosis not present

## 2021-05-25 DIAGNOSIS — J810 Acute pulmonary edema: Secondary | ICD-10-CM | POA: Diagnosis not present

## 2021-05-25 DIAGNOSIS — R778 Other specified abnormalities of plasma proteins: Secondary | ICD-10-CM

## 2021-05-25 DIAGNOSIS — I2510 Atherosclerotic heart disease of native coronary artery without angina pectoris: Secondary | ICD-10-CM | POA: Diagnosis present

## 2021-05-25 DIAGNOSIS — Z8673 Personal history of transient ischemic attack (TIA), and cerebral infarction without residual deficits: Secondary | ICD-10-CM

## 2021-05-25 DIAGNOSIS — D72829 Elevated white blood cell count, unspecified: Secondary | ICD-10-CM | POA: Diagnosis present

## 2021-05-25 DIAGNOSIS — E785 Hyperlipidemia, unspecified: Secondary | ICD-10-CM

## 2021-05-25 DIAGNOSIS — M109 Gout, unspecified: Secondary | ICD-10-CM | POA: Diagnosis present

## 2021-05-25 DIAGNOSIS — I5033 Acute on chronic diastolic (congestive) heart failure: Secondary | ICD-10-CM | POA: Diagnosis present

## 2021-05-25 DIAGNOSIS — I160 Hypertensive urgency: Secondary | ICD-10-CM

## 2021-05-25 DIAGNOSIS — J9601 Acute respiratory failure with hypoxia: Secondary | ICD-10-CM

## 2021-05-25 DIAGNOSIS — I480 Paroxysmal atrial fibrillation: Secondary | ICD-10-CM | POA: Diagnosis present

## 2021-05-25 DIAGNOSIS — Z7901 Long term (current) use of anticoagulants: Secondary | ICD-10-CM

## 2021-05-25 DIAGNOSIS — N179 Acute kidney failure, unspecified: Secondary | ICD-10-CM | POA: Diagnosis present

## 2021-05-25 DIAGNOSIS — D75839 Thrombocytosis, unspecified: Secondary | ICD-10-CM | POA: Diagnosis present

## 2021-05-25 MED ORDER — FUROSEMIDE 10 MG/ML IJ SOLN
40.0000 mg | Freq: Once | INTRAMUSCULAR | Status: AC
  Administered 2021-05-26: 40 mg via INTRAMUSCULAR
  Filled 2021-05-25: qty 4

## 2021-05-25 MED ORDER — NITROGLYCERIN 2 % TD OINT
1.0000 [in_us] | TOPICAL_OINTMENT | Freq: Once | TRANSDERMAL | Status: DC
Start: 2021-05-26 — End: 2021-05-28
  Administered 2021-05-26: 1 [in_us] via TOPICAL
  Filled 2021-05-25: qty 1

## 2021-05-25 NOTE — ED Triage Notes (Signed)
Thed pt arrived by gems from home diff breathing for 2 hours  found on the porch of his house   for 30 minutes   af tonight  alert ?

## 2021-05-25 NOTE — ED Provider Notes (Addendum)
?[facility] ?Provider Note ? ? ?CSN: [id number] ?Arrival date & time: 05/25/21  2320 ? ?  ? ?History ? ?Chief Complaint  ?Patient presents with  ? Shortness of Breath  ? ? ?[name] is a 66 y.o. male. ? ?The history is provided by the patient and the EMS personnel.  ?Shortness of Breath ?He has a history of hypertension, lipidemia, paroxysmal atrial fibrillation anticoagulated on rivaroxaban, stroke comes in because of progressive shortness of breath which started this morning. He denies chest pain, heaviness, tightness, pressure. He denies cough. He denies fever or chills, but has had some sweats. He has been compliant with all medications. He had a recent hospitalization for pulmonary edema, and states that this feels similar. ?  ?Home Medications ?Prior to Admission medications   ?Medication Sig Start Date End Date Taking? Authorizing Provider  ?amiodarone (PACERONE) 200 MG tablet Take 1 tablet (200 mg total) by mouth daily. 05/20/21   Kathie Dike, MD  ?busPIRone (BUSPAR) 5 MG tablet Take 5 mg by mouth 3 (three) times daily. ?Patient not taking: Reported on 05/13/2021 01/01/21   [provider]  ?metoprolol tartrate (LOPRESSOR) 50 MG tablet Take 1 tablet (50 mg total) by mouth 2 (two) times daily. 05/19/21   Kathie Dike, MD  ?rivaroxaban (XARELTO) 20 MG TABS tablet Take 1 tablet (20 mg total) by mouth daily with supper. 05/19/21   Kathie Dike, MD  ?rosuvastatin (CRESTOR) 20 MG tablet Take 1 tablet (20 mg total) by mouth at bedtime. 05/19/21   Kathie Dike, MD  ?   ? ?Allergies    ?Patient has no known allergies.   ? ?Review of Systems   ?Review of Systems  ?Respiratory:  Positive for shortness of breath.   ?All other systems reviewed and are negative. ? ?Physical Exam ?Updated Vital Signs ?BP (!) 210/104   Pulse 86   Resp (!) 22   Ht 5\' 5"  (1.651 m)   Wt 69 kg   SpO2 100%   BMI 25.31 kg/m?  ?Physical Exam ?Vitals and nursing note reviewed.  ?66 year old  male, on BiPAP. Vital signs are significant for elevated respiratory rate and markedly elevated blood pressure. Oxygen saturation is 100%, which is normal. ?Head is normocephalic and atraumatic. PERRLA, EOMI. Oropharynx is clear. ?Neck is nontender and supple without adenopathy or JVD. ?Back is nontender and there is no CVA tenderness. ?Lungs have bibasilar rales with diffuse coarse expiratory rhonchi. ?Chest is nontender. ?Heart has regular rate and rhythm without murmur. ?Abdomen is soft, flat, nontender. ?Extremities have no cyanosis or edema, full range of motion is present. ?Skin is warm and dry without rash. ?Neurologic: Mental status is normal, cranial nerves are intact. ? ?ED Results / Procedures / Treatments   ?Labs ?(all labs ordered are listed, but only abnormal results are displayed) ?Labs Reviewed  ?CBC WITH DIFFERENTIAL/PLATELET - Abnormal; Notable for the following components:  ?    Result Value  ? WBC 19.2 (*)   ? Platelets 490 (*)   ? Neutro Abs 13.6 (*)   ? Abs Immature Granulocytes 0.29 (*)   ? All other components within normal limits  ?BASIC METABOLIC PANEL - Abnormal; Notable for the following components:  ? Glucose, Bld 256 (*)   ? Creatinine, Ser 1.67 (*)   ? GFR, Estimated 45 (*)   ? All other components within normal limits  ?TROPONIN I (HIGH SENSITIVITY) - Abnormal; Notable for the following components:  ? Troponin I (High Sensitivity) 26 (*)   ?  All other components within normal limits  ?BRAIN NATRIURETIC PEPTIDE  ?TROPONIN I (HIGH SENSITIVITY)  ? ? ?EKG ?EKG Interpretation ? ?Date/Time:  Sunday May 25 2021 23:22:38 EDT ?Ventricular Rate:  111 ?PR Interval:    ?QRS Duration: 108 ?QT Interval:  348 ?QTC Calculation: 473 ?R Axis:   77 ?Text Interpretation: Multifocal atrial tachycardia Probable anteroseptal infarct, recent Lateral leads are also involved When compared with ECG of 05/13/2021, HEART RATE has decreased Confirmed by Delora Fuel ([id number]) on 05/25/2021 11:42:16  PM ? ?Radiology ?DG Chest Portable 1 View ? ?Result Date: 05/25/2021 ?CLINICAL DATA:  Shortness of breath EXAM: PORTABLE CHEST 1 VIEW COMPARISON:  05/13/2021 FINDINGS: Cardiac shadow is stable. Increased vascular congestion is noted with interstitial edema consistent with CHF. Parenchymal edema is noted as well. No sizable effusion is seen. No focal confluent infiltrate is noted. IMPRESSION: Changes of CHF. Electronically Signed   By: Inez Catalina M.D.   On: 05/25/2021 23:42   ? ?Procedures ?Procedures  ?Cardiac monitor shows normal sinus rhythm, per my interpretation. ? ?Medications Ordered in ED ?Medications - No data to display ? ?ED Course/ Medical Decision Making/ A&P ?  ?                        ?Medical Decision Making ?Amount and/or Complexity of Data Reviewed ?Labs: ordered. ?Radiology: ordered. ? ?Risk ?Prescription drug management. ?Decision regarding hospitalization. ? ? ?Acute dyspnea suspicious for recurrent pulmonary edema. Doubt pneumonia, pneumothorax. Currently anticoagulated, pulmonary embolism is very unlikely. IV team has been consulted for difficult venous access, given IM furosemide, topical nitroglycerin.  Old records are reviewed confirming hospitalization for acute pulmonary edema 3/27-4/3.  Echocardiogram on 3/27 showed normal ejection fraction, indeterminate diastolic parameters.  Cardiac catheterization on 3/30 showed normal left ventricular end-diastolic pressure and mild, nonobstructive coronary artery disease.  ECG today shows what appears to be multifocal atrial tachycardia, not significantly changed from prior ECG other than rate is slower. ? ?12:26 AM ?IV access is obtained.  Patient is still very hypertensive and dyspneic.  Will switch nitroglycerin to intravenous and give additional furosemide. ? ?1:17 AM ?Blood pressure is controlled on IV nitroglycerin.  He has put out about 400 mL of urine and is breathing much easier.  Troponin is mildly elevated, possibly demand ischemia.  He  also was noted to have had a significantly elevated troponin at his last hospital admission and this could be a persistent elevation from that.  Labs also significant for elevation in creatinine compared with baseline, will need to follow closely as she is diuresed.  Thrombocytosis is noted, probably reactive. ? ?1:46 AM ?Patient continues to improve.  Blood pressure is now down will do 112 on small dose of nitroglycerin, will try to wean off of nitroglycerin.  Case has been discussed with Dr. Lake Bells of critical care service who agrees to see the patient in consult.  Case is discussed with Dr.McQuaid of critical care service who agrees to see the patient in consultation, but feels he would be appropriate for stepdown unit.  Case is discussed with Dr. Sidney Ace of [facility] hospitalists, who agrees to admit the patient. ? ?CRITICAL CARE ?Performed by: Delora Fuel ?Total critical care time: 120 minutes ?Critical care time was exclusive of separately billable procedures and treating other patients. ?Critical care was necessary to treat or prevent imminent or life-threatening deterioration. ?Critical care was time spent personally by me on the following activities: development of treatment plan with patient and/or surrogate as  well as nursing, discussions with consultants, evaluation of patient's response to treatment, examination of patient, obtaining history from patient or surrogate, ordering and performing treatments and interventions, ordering and review of laboratory studies, ordering and review of radiographic studies, pulse oximetry and re-evaluation of patient's condition. ? ?Final Clinical Impression(s) / ED Diagnoses ?Final diagnoses:  ?Acute pulmonary edema ([place])  ?Acute kidney injury (nontraumatic) (HCC)  ?Hypertensive urgency  ?Elevated troponin  ?Thrombocytosis  ? ? ?Rx / DC Orders ?ED Discharge Orders   ? ? None  ? ?  ? ? ?  ?Delora Fuel, MD ?[id number] ? ?  ?Delora Fuel, MD ?[id number] ? ?

## 2021-05-26 ENCOUNTER — Inpatient Hospital Stay (HOSPITAL_COMMUNITY): Payer: Medicare Other

## 2021-05-26 DIAGNOSIS — I5031 Acute diastolic (congestive) heart failure: Secondary | ICD-10-CM

## 2021-05-26 DIAGNOSIS — I160 Hypertensive urgency: Secondary | ICD-10-CM

## 2021-05-26 DIAGNOSIS — Z7901 Long term (current) use of anticoagulants: Secondary | ICD-10-CM | POA: Diagnosis not present

## 2021-05-26 DIAGNOSIS — M1990 Unspecified osteoarthritis, unspecified site: Secondary | ICD-10-CM | POA: Diagnosis present

## 2021-05-26 DIAGNOSIS — I2510 Atherosclerotic heart disease of native coronary artery without angina pectoris: Secondary | ICD-10-CM | POA: Diagnosis present

## 2021-05-26 DIAGNOSIS — J9601 Acute respiratory failure with hypoxia: Secondary | ICD-10-CM

## 2021-05-26 DIAGNOSIS — F419 Anxiety disorder, unspecified: Secondary | ICD-10-CM | POA: Diagnosis present

## 2021-05-26 DIAGNOSIS — I110 Hypertensive heart disease with heart failure: Secondary | ICD-10-CM | POA: Diagnosis present

## 2021-05-26 DIAGNOSIS — I5033 Acute on chronic diastolic (congestive) heart failure: Secondary | ICD-10-CM | POA: Diagnosis present

## 2021-05-26 DIAGNOSIS — R001 Bradycardia, unspecified: Secondary | ICD-10-CM | POA: Diagnosis not present

## 2021-05-26 DIAGNOSIS — N179 Acute kidney failure, unspecified: Secondary | ICD-10-CM

## 2021-05-26 DIAGNOSIS — J810 Acute pulmonary edema: Secondary | ICD-10-CM

## 2021-05-26 DIAGNOSIS — Z716 Tobacco abuse counseling: Secondary | ICD-10-CM | POA: Diagnosis not present

## 2021-05-26 DIAGNOSIS — M109 Gout, unspecified: Secondary | ICD-10-CM | POA: Diagnosis present

## 2021-05-26 DIAGNOSIS — D72829 Elevated white blood cell count, unspecified: Secondary | ICD-10-CM | POA: Diagnosis present

## 2021-05-26 DIAGNOSIS — I440 Atrioventricular block, first degree: Secondary | ICD-10-CM | POA: Diagnosis not present

## 2021-05-26 DIAGNOSIS — Z79899 Other long term (current) drug therapy: Secondary | ICD-10-CM | POA: Diagnosis not present

## 2021-05-26 DIAGNOSIS — I214 Non-ST elevation (NSTEMI) myocardial infarction: Secondary | ICD-10-CM | POA: Diagnosis present

## 2021-05-26 DIAGNOSIS — Z8249 Family history of ischemic heart disease and other diseases of the circulatory system: Secondary | ICD-10-CM | POA: Diagnosis not present

## 2021-05-26 DIAGNOSIS — I480 Paroxysmal atrial fibrillation: Secondary | ICD-10-CM | POA: Diagnosis present

## 2021-05-26 DIAGNOSIS — E785 Hyperlipidemia, unspecified: Secondary | ICD-10-CM | POA: Diagnosis present

## 2021-05-26 DIAGNOSIS — D75839 Thrombocytosis, unspecified: Secondary | ICD-10-CM | POA: Diagnosis present

## 2021-05-26 DIAGNOSIS — Z8673 Personal history of transient ischemic attack (TIA), and cerebral infarction without residual deficits: Secondary | ICD-10-CM | POA: Diagnosis not present

## 2021-05-26 DIAGNOSIS — F17210 Nicotine dependence, cigarettes, uncomplicated: Secondary | ICD-10-CM | POA: Diagnosis present

## 2021-05-26 LAB — RAPID URINE DRUG SCREEN, HOSP PERFORMED
Amphetamines: NOT DETECTED
Barbiturates: NOT DETECTED
Benzodiazepines: NOT DETECTED
Cocaine: NOT DETECTED
Opiates: NOT DETECTED
Tetrahydrocannabinol: POSITIVE — AB

## 2021-05-26 LAB — CBC
HCT: 43 % (ref 39.0–52.0)
Hemoglobin: 14.3 g/dL (ref 13.0–17.0)
MCH: 31.3 pg (ref 26.0–34.0)
MCHC: 33.3 g/dL (ref 30.0–36.0)
MCV: 94.1 fL (ref 80.0–100.0)
Platelets: 409 10*3/uL — ABNORMAL HIGH (ref 150–400)
RBC: 4.57 MIL/uL (ref 4.22–5.81)
RDW: 13.2 % (ref 11.5–15.5)
WBC: 22.1 10*3/uL — ABNORMAL HIGH (ref 4.0–10.5)
nRBC: 0 % (ref 0.0–0.2)

## 2021-05-26 LAB — CBC WITH DIFFERENTIAL/PLATELET
Abs Immature Granulocytes: 0.29 10*3/uL — ABNORMAL HIGH (ref 0.00–0.07)
Basophils Absolute: 0.1 10*3/uL (ref 0.0–0.1)
Basophils Relative: 1 %
Eosinophils Absolute: 0.4 10*3/uL (ref 0.0–0.5)
Eosinophils Relative: 2 %
HCT: 48.7 % (ref 39.0–52.0)
Hemoglobin: 15.9 g/dL (ref 13.0–17.0)
Immature Granulocytes: 2 %
Lymphocytes Relative: 21 %
Lymphs Abs: 3.9 10*3/uL (ref 0.7–4.0)
MCH: 31.4 pg (ref 26.0–34.0)
MCHC: 32.6 g/dL (ref 30.0–36.0)
MCV: 96.1 fL (ref 80.0–100.0)
Monocytes Absolute: 0.9 10*3/uL (ref 0.1–1.0)
Monocytes Relative: 5 %
Neutro Abs: 13.6 10*3/uL — ABNORMAL HIGH (ref 1.7–7.7)
Neutrophils Relative %: 69 %
Platelets: 490 10*3/uL — ABNORMAL HIGH (ref 150–400)
RBC: 5.07 MIL/uL (ref 4.22–5.81)
RDW: 13.2 % (ref 11.5–15.5)
WBC: 19.2 10*3/uL — ABNORMAL HIGH (ref 4.0–10.5)
nRBC: 0 % (ref 0.0–0.2)

## 2021-05-26 LAB — BASIC METABOLIC PANEL
Anion gap: 11 (ref 5–15)
Anion gap: 8 (ref 5–15)
BUN: 15 mg/dL (ref 8–23)
BUN: 17 mg/dL (ref 8–23)
CO2: 23 mmol/L (ref 22–32)
CO2: 26 mmol/L (ref 22–32)
Calcium: 8.9 mg/dL (ref 8.9–10.3)
Calcium: 8.9 mg/dL (ref 8.9–10.3)
Chloride: 103 mmol/L (ref 98–111)
Chloride: 104 mmol/L (ref 98–111)
Creatinine, Ser: 1.67 mg/dL — ABNORMAL HIGH (ref 0.61–1.24)
Creatinine, Ser: 1.53 mg/dL — ABNORMAL HIGH (ref 0.61–1.24)
GFR, Estimated: 45 mL/min — ABNORMAL LOW (ref >60)
GFR, Estimated: 50 mL/min — ABNORMAL LOW (ref >60)
Glucose, Bld: 256 mg/dL — ABNORMAL HIGH (ref 70–99)
Glucose, Bld: 110 mg/dL — ABNORMAL HIGH (ref 70–99)
Potassium: 4.2 mmol/L (ref 3.5–5.1)
Potassium: 4.5 mmol/L (ref 3.5–5.1)
Sodium: 137 mmol/L (ref 135–145)
Sodium: 138 mmol/L (ref 135–145)

## 2021-05-26 LAB — ECHOCARDIOGRAM LIMITED
Height: 65 in
S' Lateral: 3.2 cm
Weight: 2557.34 oz

## 2021-05-26 LAB — BRAIN NATRIURETIC PEPTIDE: B Natriuretic Peptide: 530.2 pg/mL — ABNORMAL HIGH (ref 0.0–100.0)

## 2021-05-26 LAB — TROPONIN I (HIGH SENSITIVITY)
Troponin I (High Sensitivity): 26 ng/L — ABNORMAL HIGH (ref <18)
Troponin I (High Sensitivity): 98 ng/L — ABNORMAL HIGH (ref <18)

## 2021-05-26 LAB — PROCALCITONIN: Procalcitonin: 0.47 ng/mL

## 2021-05-26 MED ORDER — METOPROLOL TARTRATE 50 MG PO TABS
50.0000 mg | ORAL_TABLET | Freq: Two times a day (BID) | ORAL | Status: DC
  Filled 2021-05-26: qty 1

## 2021-05-26 MED ORDER — RIVAROXABAN 20 MG PO TABS
20.0000 mg | ORAL_TABLET | Freq: Every day | ORAL | Status: DC
  Administered 2021-05-26 – 2021-05-27 (×2): 20 mg via ORAL
  Filled 2021-05-26 (×2): qty 1

## 2021-05-26 MED ORDER — FUROSEMIDE 10 MG/ML IJ SOLN: 20.0000 mg | Freq: Once | INTRAMUSCULAR | Status: DC

## 2021-05-26 MED ORDER — ONDANSETRON HCL 4 MG/2ML IJ SOLN
4.0000 mg | Freq: Four times a day (QID) | INTRAMUSCULAR | Status: DC | PRN
Start: 2021-05-26 — End: 2021-05-28

## 2021-05-26 MED ORDER — NITROGLYCERIN IN D5W 200-5 MCG/ML-% IV SOLN
0.0000 ug/min | INTRAVENOUS | Status: DC
  Administered 2021-05-26: 30 ug/min via INTRAVENOUS

## 2021-05-26 MED ORDER — TRAZODONE HCL 50 MG PO TABS
25.0000 mg | ORAL_TABLET | Freq: Every evening | ORAL | Status: DC | PRN
  Administered 2021-05-27: 25 mg via ORAL
  Filled 2021-05-26: qty 1

## 2021-05-26 MED ORDER — MAGNESIUM HYDROXIDE 400 MG/5ML PO SUSP: 30.0000 mL | Freq: Every day | ORAL | Status: DC | PRN

## 2021-05-26 MED ORDER — NITROGLYCERIN IN D5W 200-5 MCG/ML-% IV SOLN
INTRAVENOUS | Status: AC
  Administered 2021-05-26: 30 ug/min via INTRAVENOUS
  Filled 2021-05-26: qty 250

## 2021-05-26 MED ORDER — BUSPIRONE HCL 5 MG PO TABS
5.0000 mg | ORAL_TABLET | Freq: Three times a day (TID) | ORAL | Status: DC
  Administered 2021-05-26 – 2021-05-28 (×7): 5 mg via ORAL
  Filled 2021-05-26 (×7): qty 1

## 2021-05-26 MED ORDER — FUROSEMIDE 10 MG/ML IJ SOLN: 40.0000 mg | Freq: Every day | INTRAMUSCULAR | Status: DC

## 2021-05-26 MED ORDER — HYDRALAZINE HCL 20 MG/ML IJ SOLN: 10.0000 mg | Freq: Four times a day (QID) | INTRAMUSCULAR | Status: DC | PRN

## 2021-05-26 MED ORDER — METOPROLOL TARTRATE 50 MG PO TABS
50.0000 mg | ORAL_TABLET | Freq: Two times a day (BID) | ORAL | Status: DC
  Administered 2021-05-26 – 2021-05-27 (×3): 50 mg via ORAL
  Filled 2021-05-26 (×3): qty 1

## 2021-05-26 MED ORDER — FUROSEMIDE 10 MG/ML IJ SOLN
40.0000 mg | Freq: Every day | INTRAMUSCULAR | Status: DC
Start: 2021-05-27 — End: 2021-05-26

## 2021-05-26 MED ORDER — FUROSEMIDE 10 MG/ML IJ SOLN
60.0000 mg | Freq: Two times a day (BID) | INTRAMUSCULAR | Status: DC
  Administered 2021-05-26: 60 mg via INTRAVENOUS
  Filled 2021-05-26: qty 6

## 2021-05-26 MED ORDER — ACETAMINOPHEN 650 MG RE SUPP: 650.0000 mg | Freq: Four times a day (QID) | RECTAL | Status: DC | PRN

## 2021-05-26 MED ORDER — AMIODARONE HCL 200 MG PO TABS
200.0000 mg | ORAL_TABLET | Freq: Every day | ORAL | Status: DC
  Administered 2021-05-26 – 2021-05-28 (×3): 200 mg via ORAL
  Filled 2021-05-26 (×3): qty 1

## 2021-05-26 MED ORDER — FUROSEMIDE 10 MG/ML IJ SOLN: 40.0000 mg | Freq: Once | INTRAMUSCULAR | Status: DC

## 2021-05-26 MED ORDER — ROSUVASTATIN CALCIUM 20 MG PO TABS
20.0000 mg | ORAL_TABLET | Freq: Every day | ORAL | Status: DC
  Administered 2021-05-26 – 2021-05-27 (×2): 20 mg via ORAL
  Filled 2021-05-26 (×2): qty 1

## 2021-05-26 MED ORDER — ONDANSETRON HCL 4 MG PO TABS: 4.0000 mg | ORAL_TABLET | Freq: Four times a day (QID) | ORAL | Status: DC | PRN

## 2021-05-26 MED ORDER — ACETAMINOPHEN 325 MG PO TABS
650.0000 mg | ORAL_TABLET | Freq: Four times a day (QID) | ORAL | Status: DC | PRN
Start: 2021-05-26 — End: 2021-05-28

## 2021-05-26 NOTE — ED Notes (Signed)
The pt reports that his breathing is a little better ?

## 2021-05-26 NOTE — Progress Notes (Addendum)
? ?NAME:  [name], MRN:  [id number], DOB:  [birth year], LOS: 0 ?ADMISSION DATE:  05/25/2021, CONSULTATION DATE: April 10 ?REFERRING MD: Dr. Preston Fleeting, CHIEF COMPLAINT: Dyspnea ? ?History of Present Illness:  ?66 year old male current every day smoker with a 20 pack year smoking history, who was just discharged from this facility with flash pulmonary edema causing acute respiratory failure with hypoxemia during which time he had a left heart catheterization showing nonobstructive coronary disease presented to [facility] on April 10 complaining of the sudden onset of shortness of breath while asleep.  The patient says that he has not had leg swelling or shortness of breath at home prior to tonight.  He woke up in the [place] gasping for air.  EMS was called and brought him in and he was placed on BiPAP.  He has been given Lasix and nitro glycerin infusion.  By report he was having difficulty breathing for 2 hours prior to admission.  The patient says that he had felt a little feverish after EMS arrived but he has not had any cough or chills. ? ?On April 3 the patient was discharged from this hospital with a principal diagnosis of acute pulmonary edema, wide-complex tachycardia.  During his hospital visit he had an elevated troponin, had a cardiac catheterization which showed nonobstructive coronary disease.  He was started on amiodarone for wide-complex tachycardia.  Pulmonary and critical care medicine help manage him as he was intubated for approximately 3 days. ? ?The patient says that since he has been at home he has been taking all the medicines as prescribed on discharge. ? ?Pertinent  Medical History  ?Acute kidney injury ?Alcohol use disorder ?Status post stroke 2016, right MCA ?Colon polyps ?Gout ?Hypertension ?Hyperlipidemia ?Paroxysmal A-fib ? ?Significant Hospital Events: ?Including procedures, antibiotic start and stop dates in addition to other pertinent events   ?4/10 came to ER with dyspnea,  hypoxemia ?4/10 am>> Net negative 1600 cc's, remains on BiPAP this am  ? ?Interim History / Subjective:  ?Net negative 1600 cc's per flow sheet.  ?Troponin is climbed from 26 to 98, WBC is 22.1 (19.2,),  HGB 14.3(15.9) ?Pt is afebrile ?BNP 530, creatinine 1.67 ?No am CXR ?Sats are 100% on 50 %set rate of BIPAP 14 cm H2O/ EPAP 8 cm H2O ?BP 130/75, HR 48- 51, RR 19 ?Asleep, laying flat on his side, states he feels a little better. No new CXR  to review ?States breathing is a little bit better ? ?Objective   ?Blood pressure 130/75, pulse (!) 55, temperature 98.4 ?F (36.9 ?C), temperature source Axillary, resp. rate (!) 22, height 5\' 5"  (1.651 m), weight 72.5 kg, SpO2 100 %. ?   ?Vent Mode: BIPAP ?FiO2 (%):  [50 %-60 %] 50 % ?Set Rate:  [12 bmp] 12 bmp ?PEEP:  [8 cmH20] 8 cmH20  ? ?Intake/Output Summary (Last 24 hours) at 05/26/2021 0818 ?Last data filed at 05/26/2021 0356 ?Gross per 24 hour  ?Intake --  ?Output 1600 ml  ?Net -1600 ml  ? ?Filed Weights  ? 05/25/21 2324 05/26/21 0639  ?Weight: 69 kg 72.5 kg  ? ? ?Examination: ? ?General:  Asleep  in bed on BIPAP, laying flat ?HENT: NCAT OP clear ?PULM: Bilateral chest excursion, few crackles noted, rate 19 ?CV: Bradycardia, HR 48-51 per tele ?GI: BS+, soft, non tender, ND, Body mass index is 26.6 kg/m?.  ?MSK: normal bulk and tone ?Derm: 1+ edema noted hands, feet ?Neuro: Sleepy, but easy to arouse, follows commands and answers  questions appropriately  ? ? ? ?Resolved Hospital Problem list   ? ? ?Assessment & Plan:  ?Acute respiratory failure with hypoxemia due to acute pulmonary edema ?Acute decompensated diastolic heart failure ?Atrial fibrillation ?Hypertension ?Anasarca ?AKI ?Leukocytosis ? ?Discussion: ?Acute decompensated heart failure causing acute pulmonary edema  ?Improved with nitroglycerin , Lasix, and non invasive mechanical ventilation    ?Leukocytosis but no cough or fever or chills.   ?? Home medication compliance ?Plan: ?Admit to hospitalist ?Wean off  nitroglycerin ?Monitor urine output closely ?Try off BiPAP this am  ?CXR now and prn ?Titrate O2 to maintain O2 saturation greater than 88% ?Continue Lasix as ordered ?Monitor urine output closely ?Hold antibiotics for now ?Telemetry monitoring ?Would resume amiodarone, Xarelto, metoprolol, Crestor ?Consider transitioning to Nitropatch versus Imdur ?Cardiology consult in a.m. ?FU CXR prn ? ?Bradycardia  ?Plan ?Consider holding dose of amiodarone / Beta blocker for HR < 60  ?Consider dose adjustment ( decrease)  ?Cardiology consult ? ?Best Practice (right click and "Reselect all SmartList Selections" daily)  ? ?Diet/type: NPO ?DVT prophylaxis: DOAC ?GI prophylaxis: N/A ?Lines: N/A ?Foley:  Yes, and it is still needed ?Code Status:  full code ?Last date of multidisciplinary goals of care discussion [4/10] ? ?Labs   ?CBC: ?Recent Labs  ?Lab 05/26/21 ?0021 05/26/21 ?6045  ?WBC 19.2* 22.1*  ?NEUTROABS 13.6*  --   ?HGB 15.9 14.3  ?HCT 48.7 43.0  ?MCV 96.1 94.1  ?PLT 490* 409*  ? ? ?Basic Metabolic Panel: ?Recent Labs  ?Lab 05/26/21 ?0021 05/26/21 ?4098  ?NA 137 138  ?K 4.2 4.5  ?CL 103 104  ?CO2 23 26  ?GLUCOSE 256* 110*  ?BUN 15 17  ?CREATININE 1.67* 1.53*  ?CALCIUM 8.9 8.9  ? ?GFR: ?Estimated Creatinine Clearance: 41.9 mL/min (A) (by C-G formula based on SCr of 1.53 mg/dL (H)). ?Recent Labs  ?Lab 05/26/21 ?0021 05/26/21 ?1191  ?WBC 19.2* 22.1*  ? ? ?Liver Function Tests: ?No results for input(s): AST, ALT, ALKPHOS, BILITOT, PROT, ALBUMIN in the last 168 hours. ?No results for input(s): LIPASE, AMYLASE in the last 168 hours. ?No results for input(s): AMMONIA in the last 168 hours. ? ?ABG ?   ?Component Value Date/Time  ? PHART 7.514 (H) 05/14/2021 0411  ? PCO2ART 31.3 (L) 05/14/2021 0411  ? PO2ART 299 (H) 05/14/2021 0411  ? HCO3 25.2 05/14/2021 0411  ? TCO2 26 05/14/2021 0411  ? ACIDBASEDEF 5.0 (H) 05/13/2021 1617  ? O2SAT 100 05/14/2021 0411  ?  ? ?Coagulation Profile: ?No results for input(s): INR, PROTIME in the last  168 hours. ? ?Cardiac Enzymes: ?No results for input(s): CKTOTAL, CKMB, CKMBINDEX, TROPONINI in the last 168 hours. ? ?HbA1C: ?Hgb A1c MFr Bld  ?Date/Time Value Ref Range Status  ?05/12/2021 11:55 AM 5.4 4.8 - 5.6 % Final  ?  Comment:  ?  (NOTE) ?Pre diabetes:          5.7%-6.4% ? ?Diabetes:              >6.4% ? ?Glycemic control for   <7.0% ?adults with diabetes ?  ?05/12/2017 11:33 AM 5.6 4.8 - 5.6 % Final  ?  Comment:  ?           Prediabetes: 5.7 - 6.4 ?         Diabetes: >6.4 ?         Glycemic control for adults with diabetes: <7.0 ?  ? ? ?CBG: ?No results for input(s): GLUCAP in the last 168 hours. ? ? ?  Allergies ?No Known Allergies  ? ?Home Medications  ?Prior to Admission medications   ?Medication Sig Start Date End Date Taking? Authorizing Provider  ?amiodarone (PACERONE) 200 MG tablet Take 1 tablet (200 mg total) by mouth daily. 05/20/21   Erick BlinksMemon, Jehanzeb, MD  ?busPIRone (BUSPAR) 5 MG tablet Take 5 mg by mouth 3 (three) times daily. ?Patient not taking: Reported on 05/13/2021 01/01/21   [provider]  ?metoprolol tartrate (LOPRESSOR) 50 MG tablet Take 1 tablet (50 mg total) by mouth 2 (two) times daily. 05/19/21   Erick BlinksMemon, Jehanzeb, MD  ?rivaroxaban (XARELTO) 20 MG TABS tablet Take 1 tablet (20 mg total) by mouth daily with supper. 05/19/21   Erick BlinksMemon, Jehanzeb, MD  ?rosuvastatin (CRESTOR) 20 MG tablet Take 1 tablet (20 mg total) by mouth at bedtime. 05/19/21   Erick BlinksMemon, Jehanzeb, MD  ?  ? ?Critical care time:  20 minutes ?  ? ?Bevelyn NgoSarah F. Meklit Cotta, MSN, AGACNP-BC ?[facility] ?See Amion for personal pager ?PCCM on call pager [phone]  ?05/26/2021 ?8:31 AM ? ? ? ?

## 2021-05-26 NOTE — Progress Notes (Signed)
MD on call notified for pt's HR getting up 135. AM Metoprolol held for a HR of 47. HR ranged between 59-135 within in the last 30 minutes. Pt resting in bed comfortably in no acute distress. Will await ordered form MD.  ? ?[name] M ? ?

## 2021-05-26 NOTE — Consult Note (Addendum)
NAME:  [name], MRN:  [id number], DOB:  Jun 30, 1955, LOS: 0 ADMISSION DATE:  05/25/2021, CONSULTATION DATE: April 10 REFERRING MD: Dr. Preston Fleeting, CHIEF COMPLAINT: Dyspnea  History of Present Illness:  66 year old male who was just discharged from this facility with flash pulmonary edema causing acute respiratory failure with hypoxemia during which time he had a left heart catheterization showing nonobstructive coronary disease presented to [facility] on April 10 complaining of the sudden onset of shortness of breath while asleep.  The patient says that he has not had leg swelling or shortness of breath at home prior to tonight.  He woke up in the [place] gasping for air.  EMS was called and brought him in and he was placed on BiPAP.  He has been given Lasix and nitro glycerin infusion.  By report he was having difficulty breathing for 2 hours prior to admission.  The patient says that he had felt a little feverish after EMS arrived but he has not had any cough or chills.  On April 3 the patient was discharged from this hospital with a principal diagnosis of acute pulmonary edema, wide-complex tachycardia.  During his hospital visit he had an elevated troponin, had a cardiac catheterization which showed nonobstructive coronary disease.  He was started on amiodarone for wide-complex tachycardia.  Pulmonary and critical care medicine help manage him as he was intubated for approximately 3 days.  The patient says that since he has been at home he has been taking all the medicines as prescribed on discharge.  Pertinent  Medical History  Acute kidney injury Alcohol use disorder Status post stroke 2016, right MCA Colon polyps Gout Hypertension Hyperlipidemia Paroxysmal A-fib  Significant Hospital Events: Including procedures, antibiotic start and stop dates in addition to other pertinent events   4/10 came to ER with dyspnea, hypoxemia  Interim History / Subjective:  As above  Objective    Blood pressure 118/71, pulse 62, resp. rate (!) 32, height 5\' 5"  (1.651 m), weight 69 kg, SpO2 97 %.    Vent Mode: BIPAP FiO2 (%):  [60 %] 60 % Set Rate:  [12 bmp] 12 bmp PEEP:  [8 cmH20] 8 cmH20  No intake or output data in the 24 hours ending 05/26/21 0200 Filed Weights   05/25/21 2324  Weight: 69 kg    Examination:  General:  Resting in bed on BIPAP HENT: NCAT OP clear PULM: Coarse crackles R base B, normal effort CV: Tachycardic, regular, no mgr GI: BS+, soft, nontender MSK: normal bulk and tone Derm: edema noted hands, feet Neuro: awake, alert, oriented to situation MAEW  April 10 chest x-ray images independently reviewed showing bibasilar airspace disease, minimal pleural effusion  Resolved Hospital Problem list     Assessment & Plan:  Acute respiratory failure with hypoxemia due to acute pulmonary edema Acute decompensated diastolic heart failure Atrial fibrillation Hypertension Anasarca AKI Leukocytosis  Discussion: Acute decompensated heart failure causing acute pulmonary edema now seems to be improved with nitroglycerin and Lasix.  He says his respiratory status overall is better than it was about an hour after coming in.  He remains on noninvasive mechanical ventilation and is still somewhat tachypneic.  I think he would benefit from a few more hours of noninvasive mechanical ventilation though given all the diuresis he is having I think it is okay to go ahead and start weaning off of nitroglycerin while monitoring his blood pressure closely.  He has a leukocytosis but no cough or fever or chills.  He had a subjective fever at home.  Monitor carefully here but at this time would not use antibiotics.  I am worried he may not be compliant with his home medications.   Plan: Admit to hospitalist Wean off nitroglycerin Monitor urine output closely Maintain BiPAP for another 3 to 4 hours then stop Titrate O2 to maintain O2 saturation greater than 88% Continue  Lasix as ordered Monitor urine output closely As noted above I do not think he needs antibiotics right now Telemetry monitoring Would resume amiodarone, Xarelto, metoprolol, Crestor Consider transitioning to Nitropatch versus Imdur Likely needs cardiology consult in a.m. Check urine drug screen    Best Practice (right click and "Reselect all SmartList Selections" daily)   Diet/type: NPO DVT prophylaxis: DOAC GI prophylaxis: N/A Lines: N/A Foley:  Yes, and it is still needed Code Status:  full code Last date of multidisciplinary goals of care discussion [4/10]  Labs   CBC: Recent Labs  Lab 05/19/21 0207 05/26/21 0021  WBC 10.2 19.2*  NEUTROABS  --  13.6*  HGB 12.9* 15.9  HCT 38.5* 48.7  MCV 92.1 96.1  PLT 277 490*    Basic Metabolic Panel: Recent Labs  Lab 05/19/21 0207 05/26/21 0021  NA 136 137  K 3.9 4.2  CL 105 103  CO2 24 23  GLUCOSE 113* 256*  BUN 15 15  CREATININE 1.03 1.67*  CALCIUM 9.1 8.9  MG 1.9  --    GFR: Estimated Creatinine Clearance: 38.4 mL/min (A) (by C-G formula based on SCr of 1.67 mg/dL (H)). Recent Labs  Lab 05/19/21 0207 05/26/21 0021  WBC 10.2 19.2*    Liver Function Tests: No results for input(s): AST, ALT, ALKPHOS, BILITOT, PROT, ALBUMIN in the last 168 hours. No results for input(s): LIPASE, AMYLASE in the last 168 hours. No results for input(s): AMMONIA in the last 168 hours.  ABG    Component Value Date/Time   PHART 7.514 (H) 05/14/2021 0411   PCO2ART 31.3 (L) 05/14/2021 0411   PO2ART 299 (H) 05/14/2021 0411   HCO3 25.2 05/14/2021 0411   TCO2 26 05/14/2021 0411   ACIDBASEDEF 5.0 (H) 05/13/2021 1617   O2SAT 100 05/14/2021 0411     Coagulation Profile: No results for input(s): INR, PROTIME in the last 168 hours.  Cardiac Enzymes: No results for input(s): CKTOTAL, CKMB, CKMBINDEX, TROPONINI in the last 168 hours.  HbA1C: Hgb A1c MFr Bld  Date/Time Value Ref Range Status  05/12/2021 11:55 AM 5.4 4.8 - 5.6 %  Final    Comment:    (NOTE) Pre diabetes:          5.7%-6.4%  Diabetes:              >6.4%  Glycemic control for   <7.0% adults with diabetes   05/12/2017 11:33 AM 5.6 4.8 - 5.6 % Final    Comment:             Prediabetes: 5.7 - 6.4          Diabetes: >6.4          Glycemic control for adults with diabetes: <7.0     CBG: Recent Labs  Lab 05/19/21 0727  GLUCAP 149*    Review of Systems:   Cannot obtain due to BIPAP  Past Medical History:  He,  has a past medical history of AKI (acute kidney injury) (HCC) (03/20/2015), Alcohol use with alcohol-induced mood disorder (HCC), Alterations of sensations following CVA (cerebrovascular accident) (09/10/2014), Arthritis, Colon polyps (2011), 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   Surgical History:   Past Surgical History:  Procedure Laterality Date   COLONOSCOPY  2011   Dr Loreta Ave.    EP IMPLANTABLE DEVICE N/A 07/27/2014   Procedure: Loop Recorder Insertion;  Surgeon: Marinus Maw, MD;  Location: [facility] INVASIVE CV LAB;  Service: Cardiovascular;  Laterality: N/A;   EP IMPLANTABLE DEVICE N/A 12/20/2014   Procedure: Loop Recorder Removal;  Surgeon: Marinus Maw, MD;  Location: [facility] INVASIVE CV LAB;  Service: Cardiovascular;  Laterality: N/A;   ESOPHAGOGASTRODUODENOSCOPY N/A 03/21/2015   Procedure: ESOPHAGOGASTRODUODENOSCOPY (EGD);  Surgeon: Jeani Hawking, MD;  Location: [facility] ENDOSCOPY;  Service: Endoscopy;  Laterality: N/A;   LEFT HEART CATH AND CORONARY ANGIOGRAPHY N/A 05/15/2021   Procedure: LEFT HEART CATH AND CORONARY ANGIOGRAPHY;  Surgeon: Corky Crafts, MD;  Location: [facility] INVASIVE CV LAB;  Service: Cardiovascular;  Laterality: N/A;   LOOP RECORDER IMPLANT  07/27/14   LOOP REVEAL LINQ [id number] - [id number]   loop recorder removed 2017     TEE WITHOUT CARDIOVERSION N/A 07/27/2014    Procedure: TRANSESOPHAGEAL ECHOCARDIOGRAM (TEE);  Surgeon: Chrystie Nose, MD;  Location: [facility] ENDOSCOPY;  Service: Cardiovascular;  Laterality: N/A;     Social History:   reports that he has been smoking cigarettes. He has a 20.00 pack-year smoking history. He has never used smokeless tobacco. He reports that he does not currently use alcohol. He reports current drug use. Drug: Marijuana.   Family History:  His family history includes Alcohol abuse in his brother; Cancer in his father; Heart attack in his mother; Heart attack (age of onset: 59) in his brother; Heart disease in his brother; Hypertension in his brother.   Allergies No Known Allergies   Home Medications  Prior to Admission medications   Medication Sig Start Date End Date Taking? Authorizing Provider  amiodarone (PACERONE) 200 MG tablet Take 1 tablet (200 mg total) by mouth daily. 05/20/21   Erick Blinks, MD  busPIRone (BUSPAR) 5 MG tablet Take 5 mg by mouth 3 (three) times daily. Patient not taking: Reported on 05/13/2021 01/01/21   [provider]  metoprolol tartrate (LOPRESSOR) 50 MG tablet Take 1 tablet (50 mg total) by mouth 2 (two) times daily. 05/19/21   Erick Blinks, MD  rivaroxaban (XARELTO) 20 MG TABS tablet Take 1 tablet (20 mg total) by mouth daily with supper. 05/19/21   Erick Blinks, MD  rosuvastatin (CRESTOR) 20 MG tablet Take 1 tablet (20 mg total) by mouth at bedtime. 05/19/21   Erick Blinks, MD     Critical care time: 30 minutes    Heber Union, MD [place] [facility] Pager: [phone] Cell: [phone] After 7:00 pm call Elink  [phone]

## 2021-05-26 NOTE — Progress Notes (Addendum)
?PROGRESS NOTE ? ? ? [name]  [id number] DOB: 08-23-1955 DOA: 05/25/2021 ?PCP: Harrison Mons, PA  ? ? ?Brief Narrative:  ?[name] is a 66 y.o. male with past medical history of hypertension, dyslipidemia, paroxysmal atrial fibrillation, CVA and congestive heart failure with recent admission for acute pulmonary edema thought to be related to hypertensive urgency as well as aspiration pneumonia with subsequent acute respiratory failure requiring intubation and mechanical ventilation, from 3/30 till 05/19/21, to hospital this time with acute progressive shortness of breath without any chest pain.  In the emergency department patient was noted to have Bp of 165/126 and later 210/104 with respiratory to 21-22.  Patient was placed on BiPAP due to significant respiratory distress.  Labs showed hyperglycemia and elevated creatinine at 1.6 from 1.0 on 4 3.  BNP was elevated at 530.  CBC showed significant leukocytosis at 19.2.  EKG showed multifocal atrial tachycardia.  Chest x-ray showed pulmonary vascular congestion.  Patient was given Lasix in the ED including Nitropaste and nitro glycerin drip and was admitted to hospital.  PCCM was also consulted initially who recommended admission with noninvasive ventilation.  ? ?Assessment and Plan: ? ?* Acute pulmonary edema (HCC) ?Due to acute on chronic diastolic heart failure from hypertensive urgency.   2D echocardiogram on 05/12/2021 showed EF of 60 to 65%.  Currently on BiPAP.  Will wean as able.  PCCM also followed the patient continue to monitor intake and output charting Daily weights.  Was on nitro glycerin drip which has been discontinued.  We will transition to Nitropatch.  Negative balance of 1600 mL at this time.  We will add Lasix 40 mg IV daily for now.  Follow cardiology recommendations.  Check 2D echocardiogram. ? ?Acute hypoxic respiratory failure secondary to congestive heart failure exacerbation.  On BiPAP currently.  Continue to wean as able.  Continue  diuretics. ?  ?Hypertensive urgency ?Patient was initially put on IV nitroglycerin drip.  Continue as needed hydralazine, continue metoprolol 50 twice daily, will transition to Nitropaste. ? ?Anxiety ?Continue trazodone, BuSpar. ?  ?PAF bradycardia.  (paroxysmal atrial fibrillation) ([place]) ?Continue Lopressor and Xarelto with holding parameters.  Cardiology has been consulted. ? ?Elevated troponins.  No chest pain.  Could be secondary to respiratory distress pulmonary edema.  Cardiology has been consulted and patient did have a left heart catheterization with mild nonobstructive CAD  from 05/15/2021 ?  ?Dyslipidemia ?Continue Crestor from home ?   ? DVT prophylaxis:  ?rivaroxaban (XARELTO) tablet 20 mg  ? ?Code Status:   ?  Code Status: Full Code ? ?Disposition: Home ? ?Status is: Inpatient ? ?Remains inpatient appropriate because: Acute hypoxic respiratory failure, pulmonary edema, heart failure ? ? Family Communication: Spoke with the patient at bedside. ? ?Consultants:  ?PCCM ?Cardiology ? ?Procedures:  ?BiPAP ? ?Antimicrobials:  ?None ? ?Anti-infectives (From admission, onward)  ? ? None  ? ?  ? ?Subjective: ?Today, patient was seen and examined at bedside.  Patient denies any chest pain, dizziness, lightheadedness but has mild shortness of breath on BiPAP. ? ?Objective: ?Vitals:  ? 05/26/21 0748 05/26/21 0803 05/26/21 1130 05/26/21 1135  ?BP: 116/69 130/75  121/86  ?Pulse: (!) 50 (!) 55 (!) 54 73  ?Resp: 19 (!) 22 18 17   ?Temp:      ?TempSrc:    Oral  ?SpO2: 100% 100% 100% 97%  ?Weight:      ?Height:      ? ? ?Intake/Output Summary (Last 24 hours) at 05/26/2021 1150 ?Last data  filed at 05/26/2021 1036 ?Gross per 24 hour  ?Intake --  ?Output 3400 ml  ?Net -3400 ml  ? ?Filed Weights  ? 05/25/21 2324 05/26/21 0639  ?Weight: 69 kg 72.5 kg  ? ? ?Physical Examination: ? ?General:  Average built, in mild to moderate distress on BiPAP. ?HENT:   No scleral pallor or icterus noted. Oral mucosa is moist.  ?Chest:     Diminished breath sounds bilaterally.  Coarse breath sounds noted. ?CVS: S1 &S2 heard. No murmur.  Regular rate and rhythm. ?Abdomen: Soft, nontender, nondistended.  Bowel sounds are heard.   ?Extremities: No cyanosis, clubbing or edema peripheral pulses are palpable. ?Psych: Alert, awake and oriented, normal mood ?CNS:  No cranial nerve deficits.  Power equal in all extremities.   ?Skin: Warm and dry.  No rashes noted. ? ?Data Reviewed:  ? ?CBC: ?Recent Labs  ?Lab 05/26/21 ?0021 05/26/21 ?[id number]  ?WBC 19.2* 22.1*  ?NEUTROABS 13.6*  --   ?HGB 15.9 14.3  ?HCT 48.7 43.0  ?MCV 96.1 94.1  ?PLT 490* 409*  ? ? ?Basic Metabolic Panel: ?Recent Labs  ?Lab 05/26/21 ?0021 05/26/21 ?[id number]  ?NA 137 138  ?K 4.2 4.5  ?CL 103 104  ?CO2 23 26  ?GLUCOSE 256* 110*  ?BUN 15 17  ?CREATININE 1.67* 1.53*  ?CALCIUM 8.9 8.9  ? ? ?Liver Function Tests: ?No results for input(s): AST, ALT, ALKPHOS, BILITOT, PROT, ALBUMIN in the last 168 hours. ? ? ?Radiology Studies: ?DG CHEST PORT 1 VIEW ? ?Result Date: 05/26/2021 ?CLINICAL DATA:  Pulmonary edema. EXAM: PORTABLE CHEST 1 VIEW COMPARISON:  05/25/2021 FINDINGS: The heart size and mediastinal contours are within normal limits. There is decrease in pulmonary edema since the prior chest x-ray. Mild interstitial edema remains. No focal consolidation, pleural fluid or pneumothorax. The visualized skeletal structures are unremarkable. IMPRESSION: Decrease in pulmonary edema. Electronically Signed   By: Aletta Edouard M.D.   On: 05/26/2021 10:13  ? ?DG Chest Portable 1 View ? ?Result Date: 05/25/2021 ?CLINICAL DATA:  Shortness of breath EXAM: PORTABLE CHEST 1 VIEW COMPARISON:  05/13/2021 FINDINGS: Cardiac shadow is stable. Increased vascular congestion is noted with interstitial edema consistent with CHF. Parenchymal edema is noted as well. No sizable effusion is seen. No focal confluent infiltrate is noted. IMPRESSION: Changes of CHF. Electronically Signed   By: Inez Catalina M.D.   On: 05/25/2021 23:42    ? ? ? LOS: 0 days  ? ? ?Flora Lipps, MD ?[facility] ?Available via Epic secure chat 7am-7pm ?After these hours, please refer to coverage provider listed on [email or web address] ?05/26/2021, 11:50 AM  ? ? ?

## 2021-05-26 NOTE — ED Notes (Signed)
Nitro patch removed due to running nitro drip ?

## 2021-05-26 NOTE — Consult Note (Addendum)
?Cardiology Consultation:  ? ?Patient ID: [name] ?MRN: [id number]; DOB: April 27, 1955 ? ?Admit date: 05/25/2021 ?Date of Consult: 05/26/2021 ? ?PCP:  Harrison Mons, PA ?  ?[facility] Providers ?Cardiologist:  Sinclair Grooms, MD  ?Electrophysiologist:  Cristopher Peru, MD  { ? ? ?Patient Profile:  ? ?[name] is a 66 y.o. male with a hx of paroxysmal atrial fibrillation, remote cryptogenic stroke, hyperlipidemia, hypertension, tobacco smoking  who is being seen 05/26/2021 for the evaluation of pulmonary edema/CHF at the request of Dr. Louanne Belton. ? ?Patient with history of cryptogenic stroke.  Placed on implantable loop recorder with finding of atrial fibrillation.  Placed on Xarelto for anticoagulation.  Last seen by Dr. Lovena Le in 2019. ? ?Admitted 05/12/21 for acute hypoxic respiratory failure in setting of flash pulmonary edema due to HTN urgency and aspiration pneumonia. Treated with broad-spectrum ABX.  Placed on heparin for HS torp 17 -> 873 -> 861 (peak). No reported prodromal symptoms or arrest. Given shock x 1 with conversion and strated on IV amio with bolus.  On 3/28 he had recurrent RVR including several short runs (3-5 beats) of what appear to be polymorphic VT in the setting of acute illness and electrolyte imbalances (though K 3.6 and Mg 1.8 that day improved from admission).  LHC 05/15/2021 showed no CAD. He continued to go in and out of flutter/fib with variable rates. Seen by EP>> continued amiodarone and started BB.  Based on note, patient was in sinus rhythm at discharge 4/3.  She was discharged on amiodarone 200 mg daily, Xarelto 20 mg daily and metoprolol titrate 50 mg twice daily. ? ?History of Present Illness:  ? ?[name] reported doing well after discharge and being compliant with medication.  Yesterday evening he had sudden onset shortness of breath.  He thinks this was due to his eating.  EMS was called and found acute hypoxic respiratory failure.  He was hypertensive upon ER arrival at  165>>126 then 210/104.  He was placed on BiPAP with improved breathing.  Placed on nitroglycerin drip and given IV Lasix 40 mg x 1. He still on BiPAP.  Home metoprolol held due to intermittent heart rate in 40s.  Blood pressure improved to 130/75. Denies palpitations.  ? ?BNP 530 ?High-sensitivity troponin 26>>98 ?WBC 22.1 ? ? ?Past Medical History:  ?Diagnosis Date  ? AKI (acute kidney injury) ([place]) 03/20/2015  ? Alcohol use with alcohol-induced mood disorder ([place])   ? quit almost all ETOH in 07/2014, previously drank 18 to 20 beers daily.   ? Alterations of sensations following CVA (cerebrovascular accident) 09/10/2014  ? Arthritis   ? Colon polyps 2011  ? Hyperplastic and 1 tubular adenoma.  Dr Collene Mares  ? 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 a. identified on TEE at time of stroke  ? Embolic stroke involving right middle cerebral artery ([place]) 10/29/2014  ? Gout   ? Hyperlipidemia   ? Hypertension   ? Paroxysmal atrial fibrillation (HCC)   ? a. identified on LINQ 08/2014  ? Stroke [facility]) 07/25/14  ? a. s/p MDT ILR implant.  on xarelto  ? ? ?Past Surgical History:  ?Procedure Laterality Date  ? COLONOSCOPY  2011  ? Dr Collene Mares.   ? EP IMPLANTABLE DEVICE N/A 07/27/2014  ? Procedure: Loop Recorder Insertion;  Surgeon: Evans Lance, MD;  Location: [place] CV LAB;  Service: Cardiovascular;  Laterality: N/A;  ? EP IMPLANTABLE DEVICE N/A 12/20/2014  ? Procedure: Loop Recorder Removal;  Surgeon: Evans Lance, MD;  Location: [place] CV LAB;  Service: Cardiovascular;  Laterality: N/A;  ? ESOPHAGOGASTRODUODENOSCOPY N/A 03/21/2015  ? Procedure: ESOPHAGOGASTRODUODENOSCOPY (EGD);  Surgeon: Carol Ada, MD;  Location: [facility] ENDOSCOPY;  Service: Endoscopy;  Laterality: N/A;  ? LEFT HEART CATH AND CORONARY ANGIOGRAPHY N/A 05/15/2021  ? Procedure: LEFT HEART CATH AND CORONARY ANGIOGRAPHY;  Surgeon: Jettie Booze, MD;  Location: [place] CV LAB;  Service: Cardiovascular;  Laterality: N/A;  ? LOOP RECORDER IMPLANT  07/27/14  ? LOOP REVEAL LINQ  [id number]  ? loop recorder removed 2017    ? TEE WITHOUT CARDIOVERSION N/A 07/27/2014  ? Procedure: TRANSESOPHAGEAL ECHOCARDIOGRAM (TEE);  Surgeon: Pixie Casino, MD;  Location: [place];  Service: Cardiovascular;  Laterality: N/A;  ?  ? ?Home Medications:  ?Prior to Admission medications   ?Medication Sig Start Date End Date Taking? Authorizing Provider  ?amiodarone (PACERONE) 200 MG tablet Take 1 tablet (200 mg total) by mouth daily. 05/20/21   Kathie Dike, MD  ?busPIRone (BUSPAR) 5 MG tablet Take 5 mg by mouth 3 (three) times daily. ?Patient not taking: Reported on 05/13/2021 01/01/21   [provider]  ?metoprolol tartrate (LOPRESSOR) 50 MG tablet Take 1 tablet (50 mg total) by mouth 2 (two) times daily. 05/19/21   Kathie Dike, MD  ?rivaroxaban (XARELTO) 20 MG TABS tablet Take 1 tablet (20 mg total) by mouth daily with supper. 05/19/21   Kathie Dike, MD  ?rosuvastatin (CRESTOR) 20 MG tablet Take 1 tablet (20 mg total) by mouth at bedtime. 05/19/21   Kathie Dike, MD  ? ? ?Inpatient Medications: ?Scheduled Meds: ? amiodarone  200 mg Oral Daily  ? busPIRone  5 mg Oral TID  ? furosemide  20 mg Intravenous Once  ? furosemide  60 mg Intravenous BID  ? metoprolol tartrate  50 mg Oral BID  ? nitroGLYCERIN  1 inch Topical Once  ? rivaroxaban  20 mg Oral Q supper  ? rosuvastatin  20 mg Oral QHS  ? ?Continuous Infusions: ? nitroGLYCERIN Stopped (05/26/21 0200)  ? ?PRN Meds: ?acetaminophen **OR** acetaminophen, hydrALAZINE, magnesium hydroxide, ondansetron **OR** ondansetron (ZOFRAN) IV, traZODone ? ?Allergies:   No Known Allergies ? ?Social History:   ?Social History  ? ?Socioeconomic History  ? Marital status: Widowed  ?  Spouse name: Marcie Bal  ? Number of children: 3  ? Years of education: 42  ? Highest education level: Not on file  ?Occupational History  ? Occupation: former Aeronautical engineer  ?  Comment: disabled due to CVA  ?Tobacco Use  ? Smoking status: Every Day  ?  Packs/day: 0.50  ?  Years:  40.00  ?  Pack years: 20.00  ?  Types: Cigarettes  ? Smokeless tobacco: Never  ? Tobacco comments:  ?  cut back after stroke, but increased again due to depression  ?Vaping Use  ? Vaping Use: Never used  ?Substance and Sexual Activity  ? Alcohol use: Not Currently  ? Drug use: Yes  ?  Types: Marijuana  ?  Comment: trying to help his appetite  ? Sexual activity: Yes  ?  Partners: Female  ?Other Topics Concern  ? Not on file  ?Social History Narrative  ? Lives at home with wife, Marcie Bal  ? Caffeine use - tea 4-5 glasses a day  ? ?Social Determinants of Health  ? ?Financial Resource Strain: Not on file  ?Food Insecurity: Not on file  ?Transportation Needs: Not on file  ?Physical Activity: Not on file  ?Stress: Not on file  ?  Social Connections: Not on file  ?Intimate Partner Violence: Not on file  ?  ?Family History:   ? ?Family History  ?Problem Relation Age of Onset  ? Cancer Father   ?     leukemia  ? Heart attack Mother   ? Heart disease Brother   ? Heart attack Brother 44  ?     CABG, stenting  ? Hypertension Brother   ? Alcohol abuse Brother   ?  ? ?ROS:  ?Please see the history of present illness.  ?All other ROS reviewed and negative.    ? ?Physical Exam/Data:  ? ?Vitals:  ? 05/26/21 0615 05/26/21 [id number] 05/26/21 [id number] 05/26/21 0803  ?BP: 109/69 116/69 116/69 130/75  ?Pulse: (!) 50 (!) 58 (!) 50 (!) 55  ?Resp: 19 (!) 21 19 (!) 22  ?Temp:  98.4 ?F (36.9 ?C)    ?TempSrc:  Axillary    ?SpO2: 98% 98% 100% 100%  ?Weight:  72.5 kg    ?Height:  5\' 5"  (1.651 m)    ? ? ?Intake/Output Summary (Last 24 hours) at 05/26/2021 0838 ?Last data filed at 05/26/2021 0356 ?Gross per 24 hour  ?Intake --  ?Output 1600 ml  ?Net -1600 ml  ? ? ?  05/26/2021  ?  6:39 AM 05/25/2021  ? 11:24 PM 05/19/2021  ?  5:20 AM  ?Last 3 Weights  ?Weight (lbs) 159 lb 13.3 oz 152 lb 1.9 oz 152 lb 3.2 oz  ?Weight (kg) 72.5 kg 69 kg 69.037 kg  ?   ?Body mass index is 26.6 kg/m?.  ?General:  ill appearing male in NAD on BIPAP ?HEENT: normal ?Neck: no JVD ?Vascular:  No carotid bruits; Distal pulses 2+ bilaterally ?Cardiac:  normal S1, S2; RRR; no murmur  ?Lungs:  clear to auscultation bilaterally, no wheezing, rhonchi or rales  ?Abd: soft, nontender, no hepatomegaly  ?E

## 2021-05-26 NOTE — Assessment & Plan Note (Signed)
-   We will continue Lopressor and Xarelto. ?

## 2021-05-26 NOTE — ED Notes (Signed)
No urine yet 

## 2021-05-26 NOTE — Assessment & Plan Note (Signed)
-   We will continue statin therapy. 

## 2021-05-26 NOTE — ED Notes (Signed)
Urine output   1000 ?

## 2021-05-26 NOTE — Hospital Course (Addendum)
[name] is a 66 y.o. male with past medical history of hypertension, dyslipidemia, paroxysmal atrial fibrillation, CVA and congestive heart failure with recent admission for acute pulmonary edema thought to be related to hypertensive urgency as well as aspiration pneumonia with subsequent acute respiratory failure requiring intubation and mechanical ventilation, from 3/30 till 05/19/21, presented to the hospital this time with acute progressive shortness of breath without any chest pain.  In the emergency department, patient was noted to have BP of 165/126 and later 210/104 with respiratory to 21-22.  Patient was placed on BiPAP due to significant respiratory distress.  Labs showed hyperglycemia and elevated creatinine at 1.6 from 1.0 on 4 3.  BNP was elevated at 530.  CBC showed significant leukocytosis at 19.2.  EKG showed multifocal atrial tachycardia.  Chest x-ray showed pulmonary vascular congestion.  Patient was given Lasix in the ED including Nitropaste and nitro glycerin drip and was admitted to hospital.  PCCM was also consulted initially who recommended admission to medical service with noninvasive ventilation.  ? ?Assessment and Plan: ? ?* Acute pulmonary edema (HCC) ?Likely due to acute on chronic diastolic heart failure from hypertensive urgency.   Repeat chest x-rays showed improved findings.  2D echocardiogram on 05/12/2021 showed EF of 60 to 65%.  Currently on nasal cannula oxygen, was on BiPAP overnight.  Continue to monitor intake and output charting Daily weights.  Was on nitro drip initially which has been changed to nitro patch.  Negative balance of 5275 ml at this time.  Cardiology has seen the patient today and has been restarted on 40 Lasix twice daily. 2D echocardiogram from 05/26/2021 showed LV ejection fraction of 55 to 60%.  We will continue to wean oxygen as able.   ? ?Acute hypoxic respiratory failure secondary to congestive heart failure exacerbation.  Possibility of underlying COPD  from history of chronic smoking.  Was on BiPAP overnight.  On nasal cannula oxygen.  Continue IV diuretics.  Procalcitonin 0.48.  Leukocytosis has trended down significantly.  Patient denies any cough congestion sputum production fever or chills.  X-ray of the chest from today shows improvement.  We will add albuterol, incentive spirometry.  Pulmonary following.  Cardiology recommended CT of the chest.  We will follow pulmonary input on this. ? ?Hypertensive urgency ?Patient was initially put on IV nitroglycerin drip.  Metoprolol 50 twice daily, Lasix 40 twice daily, as needed hydralazine and nitroglycerin ointment.  Blood pressure has improved at this time. ? ?Anxiety ?Continue trazodone, BuSpar. ? ? Paroxysmal atrial fibrillation ?Continue Lopressor and Xarelto .  Cardiology on board. ? ?Elevated troponins.  No chest pain.  Could be secondary to  heart failure respiratory distress.  Did have left heart catheterization with mild nonobstructive CAD  from 05/15/2021 ?  ?Dyslipidemia ?Continue Crestor from home ? ?Chronic nicotine dependence.  Spoke about it.  He states that he is willing to quit.  We will put the patient on nicotine patch for now. ?  ?

## 2021-05-26 NOTE — ED Notes (Signed)
Order from dr Preston Fleeting wean off niytp drip ?

## 2021-05-26 NOTE — ED Notes (Signed)
Trhew pt has finally urinated yellow clear ?

## 2021-05-26 NOTE — Assessment & Plan Note (Signed)
-   This is clearly secondary to his acute CHF and pulmonary edema. ?- We will continue BiPAP as mentioned above and follow O2 protocol ?

## 2021-05-26 NOTE — Progress Notes (Signed)
TOC consulted for SNF placement. Will need PT/OT evals for insurance. TOC following. ?

## 2021-05-26 NOTE — Progress Notes (Signed)
Pt placed on BiPAP for bedtime. RT will cont to monitor as needed. 

## 2021-05-26 NOTE — Assessment & Plan Note (Addendum)
-   The patient was placed on IV nitroglycerin drip with significant improvement in his blood pressure. ?- This was tapered off. ?- We will continue his antihypertensives and monitor his BP. ?- We will place on as needed IV hydralazine as he became slightly bradycardic lately. ?

## 2021-05-26 NOTE — ED Notes (Signed)
The pt reports that he is breathing a little better ?

## 2021-05-26 NOTE — Assessment & Plan Note (Signed)
-   Continue BuSpar 

## 2021-05-26 NOTE — Plan of Care (Signed)
°  Problem: Education: °Goal: Ability to demonstrate management of disease process will improve °Outcome: Progressing °Goal: Ability to verbalize understanding of medication therapies will improve °Outcome: Progressing °Goal: Individualized Educational Video(s) °Outcome: Progressing °  °

## 2021-05-26 NOTE — Assessment & Plan Note (Signed)
-   This clearly secondary to acute on chronic diastolic CHF most likely due to hypertensive urgency. ?- The patient will be admitted to a stepdown unit bed. ?- We will continue on BiPAP for few hours given his persistent tachypnea. ?- He will be diuresed with IV Lasix. ?- We will follow serial troponins. ?- 2D echo on 05/12/2021 that revealed an EF of 60 to 65% and diastolic function could not be evaluated then. ?- Cardiology consult will be obtained. ?- I notified Salley Hews about the patient. ?

## 2021-05-26 NOTE — ED Notes (Signed)
Report sent to alex on 6e ?

## 2021-05-26 NOTE — ED Notes (Signed)
Nitro dripo stopped bp lower breathing better good urine output ?

## 2021-05-26 NOTE — H&P (Signed)
?  ?  ?[place] ? ? ?PATIENT NAME: [name]   ? ?MR#:  [id number] ? ?DATE OF BIRTH:  November 27, 1955 ? ?DATE OF ADMISSION:  05/25/2021 ? ?PRIMARY CARE PHYSICIAN: Harrison Mons, PA  ? ?Patient is coming from: Home ? ?REQUESTING/REFERRING PHYSICIAN: Delora Fuel, MD     ? ?CHIEF COMPLAINT:  ? ?Chief Complaint  ?Patient presents with  ? Shortness of Breath  ? ? ?HISTORY OF PRESENT ILLNESS:  ?[name] is a 66 y.o. male with medical history significant for essential hypertension, dyslipidemia, paroxysmal atrial fibrillation, CVA, as well as CHF with recent admission for acute pulmonary edema thought to be related to hypertensive urgency as well as aspiration pneumonia with subsequent acute respiratory failure requiring intubation and mechanical ventilation, from 3/30 till 4/3, who presented to the emergency room with acute onset of progressive dyspnea that started yesterday morning without significant cough or wheezing.  He denies any chest pain or palpitations.  No nausea or vomiting or abdominal pain. ? ?ED Course: Upon presenting to the emergency room BP was 165/126 and later 210/104 with respiratory to 21-22 that was later at 234, with otherwise normal vital signs.  Patient was satting at 97 to 100% on BiPAP that was placed given his significant respiratory distress with initial 60% FiO2..  Labs revealed blood glucose of 256 and creatinine 1.67 up from  1.03 on 4/3.  BNP was 530.2 and high-sensitivity troponin I 26.  CBC showed leukocytosis of 19.2 with neutrophilia as well as thrombocytosis ? ?EKG as reviewed by me : Multifocal atrial tachycardia with rate of 111 with Q waves anteroseptally. ?Imaging: Portable chest ray showed acute CHF findings. ? ?The patient was given 40 mg of IM Lasix and 20 mg of IV Lasix, 1 inch Nitropaste followed by IV nitroglycerin drip for his hypertensive urgency and that was later tapered off.  He will be admitted to a stepdown unit bed for further evaluation and management. ?PAST  MEDICAL HISTORY:  ? ?Past Medical History:  ?Diagnosis Date  ? AKI (acute kidney injury) ([place]) 03/20/2015  ? Alcohol use with alcohol-induced mood disorder ([place])   ? quit almost all ETOH in 07/2014, previously drank 18 to 20 beers daily.   ? Alterations of sensations following CVA (cerebrovascular accident) 09/10/2014  ? Arthritis   ? Colon polyps 2011  ? Hyperplastic and 1 tubular adenoma.  Dr Collene Mares  ? 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 a. identified on TEE at time of stroke  ? Embolic stroke involving right middle cerebral artery ([place]) 10/29/2014  ? Gout   ? Hyperlipidemia   ? Hypertension   ? Paroxysmal atrial fibrillation (HCC)   ? a. identified on LINQ 08/2014  ? Stroke [facility]) 07/25/14  ? a. s/p MDT ILR implant.  on xarelto  ? ? ?PAST SURGICAL HISTORY:  ? ?Past Surgical History:  ?Procedure Laterality Date  ? COLONOSCOPY  2011  ? Dr Collene Mares.   ? EP IMPLANTABLE DEVICE N/A 07/27/2014  ? Procedure: Loop Recorder Insertion;  Surgeon: Evans Lance, MD;  Location: [place] [facility];  Service: Cardiovascular;  Laterality: N/A;  ? EP IMPLANTABLE DEVICE N/A 12/20/2014  ? Procedure: Loop Recorder Removal;  Surgeon: Evans Lance, MD;  Location: [place] [facility];  Service: Cardiovascular;  Laterality: N/A;  ? ESOPHAGOGASTRODUODENOSCOPY N/A 03/21/2015  ? Procedure: ESOPHAGOGASTRODUODENOSCOPY (EGD);  Surgeon: Carol Ada, MD;  Location: [facility];  Service: Endoscopy;  Laterality: N/A;  ? LEFT HEART CATH AND CORONARY ANGIOGRAPHY N/A 05/15/2021  ?  Procedure: LEFT HEART CATH AND CORONARY ANGIOGRAPHY;  Surgeon: Jettie Booze, MD;  Location: [place] [facility];  Service: Cardiovascular;  Laterality: N/A;  ? LOOP RECORDER IMPLANT  07/27/14  ? LOOP REVEAL LINQ [id number]  ? loop recorder removed 2017    ? TEE WITHOUT CARDIOVERSION N/A 07/27/2014  ? Procedure: TRANSESOPHAGEAL ECHOCARDIOGRAM (TEE);  Surgeon: Pixie Casino, MD;  Location: [place];  Service: Cardiovascular;  Laterality: N/A;  ? ? ?SOCIAL HISTORY:  ? ?Social History   ? ?Tobacco Use  ? Smoking status: Every Day  ?  Packs/day: 0.50  ?  Years: 40.00  ?  Pack years: 20.00  ?  Types: Cigarettes  ? Smokeless tobacco: Never  ? Tobacco comments:  ?  cut back after stroke, but increased again due to depression  ?Substance Use Topics  ? Alcohol use: Not Currently  ? ? ?FAMILY HISTORY:  ? ?Family History  ?Problem Relation Age of Onset  ? Cancer Father   ?     leukemia  ? Heart attack Mother   ? Heart disease Brother   ? Heart attack Brother 19  ?     CABG, stenting  ? Hypertension Brother   ? Alcohol abuse Brother   ? ? ?DRUG ALLERGIES:  ?No Known Allergies ? ?REVIEW OF SYSTEMS:  ? ?ROS ?As per history of present illness. All pertinent systems were reviewed above. Constitutional, HEENT, cardiovascular, respiratory, GI, GU, musculoskeletal, neuro, psychiatric, endocrine, integumentary and hematologic systems were reviewed and are otherwise negative/unremarkable except for positive findings mentioned above in the HPI. ? ? ?MEDICATIONS AT HOME:  ? ?Prior to Admission medications   ?Medication Sig Start Date End Date Taking? Authorizing Provider  ?amiodarone (PACERONE) 200 MG tablet Take 1 tablet (200 mg total) by mouth daily. 05/20/21   Kathie Dike, MD  ?busPIRone (BUSPAR) 5 MG tablet Take 5 mg by mouth 3 (three) times daily. ?Patient not taking: Reported on 05/13/2021 01/01/21   [provider]  ?metoprolol tartrate (LOPRESSOR) 50 MG tablet Take 1 tablet (50 mg total) by mouth 2 (two) times daily. 05/19/21   Kathie Dike, MD  ?rivaroxaban (XARELTO) 20 MG TABS tablet Take 1 tablet (20 mg total) by mouth daily with supper. 05/19/21   Kathie Dike, MD  ?rosuvastatin (CRESTOR) 20 MG tablet Take 1 tablet (20 mg total) by mouth at bedtime. 05/19/21   Kathie Dike, MD  ? ?  ? ?VITAL SIGNS:  ?Blood pressure 114/76, pulse (!) 57, resp. rate (!) 28, height 5\' 5"  (1.651 m), weight 69 kg, SpO2 98 %. ? ?PHYSICAL EXAMINATION:  ?Physical Exam ? ?GENERAL:  66 y.o.-year-old Caucasian patient  semi-lying in the bed with moderate distress with conversational dyspnea, on BiPAP. ?EYES: Pupils equal, round, reactive to light and accommodation. No scleral icterus. Extraocular muscles intact.  ?HEENT: Head atraumatic, normocephalic. Oropharynx and nasopharynx clear.  ?NECK:  Supple, no jugular venous distention. No thyroid enlargement, no tenderness.  ?LUNGS: Diminished bibasilar breath sounds with bibasal rales.  No use of accessory muscles of respiration.  ?CARDIOVASCULAR: Regular rate and rhythm, S1, S2 normal. No murmurs, rubs, or gallops.  ?ABDOMEN: Soft, nondistended, nontender. Bowel sounds present. No organomegaly or mass.  ?EXTREMITIES: No pedal edema, cyanosis, or clubbing.  ?NEUROLOGIC: Cranial nerves II through XII are intact. Muscle strength 5/5 in all extremities. Sensation intact. Gait not checked.  ?PSYCHIATRIC: The patient is alert and oriented x 3.  Normal affect and good eye contact. ?SKIN: No obvious rash, lesion, or ulcer.  ? ?  LABORATORY PANEL:  ? ?CBC ?Recent Labs  ?Lab 05/26/21 ?0021  ?WBC 19.2*  ?HGB 15.9  ?HCT 48.7  ?PLT 490*  ? ?------------------------------------------------------------------------------------------------------------------ ? ?Chemistries  ?Recent Labs  ?Lab 05/26/21 ?0021  ?NA 137  ?K 4.2  ?CL 103  ?CO2 23  ?GLUCOSE 256*  ?BUN 15  ?CREATININE 1.67*  ?CALCIUM 8.9  ? ?------------------------------------------------------------------------------------------------------------------ ? ?Cardiac Enzymes ?No results for input(s): TROPONINI in the last 168 hours. ?------------------------------------------------------------------------------------------------------------------ ? ?RADIOLOGY:  ?DG Chest Portable 1 View ? ?Result Date: 05/25/2021 ?CLINICAL DATA:  Shortness of breath EXAM: PORTABLE CHEST 1 VIEW COMPARISON:  05/13/2021 FINDINGS: Cardiac shadow is stable. Increased vascular congestion is noted with interstitial edema consistent with CHF. Parenchymal edema is noted  as well. No sizable effusion is seen. No focal confluent infiltrate is noted. IMPRESSION: Changes of CHF. Electronically Signed   By: Inez Catalina M.D.   On: 05/25/2021 23:42   ? ? ? ?IMPRESSION AND PLAN:

## 2021-05-26 NOTE — ED Notes (Signed)
Breakfast order placed ?

## 2021-05-26 NOTE — Progress Notes (Signed)
Heart Failure Navigator Progress Note ? ?Following this hospitalization to assess for HV TOC readiness.  ? ?EF 60-65% ? ? ?Earnestine Leys, BSN, RN ?Heart Failure Nurse Navigator ?Secure Chat Only  ?

## 2021-05-27 ENCOUNTER — Inpatient Hospital Stay (HOSPITAL_COMMUNITY): Payer: Medicare Other

## 2021-05-27 ENCOUNTER — Encounter (HOSPITAL_COMMUNITY): Payer: Self-pay | Admitting: Family Medicine

## 2021-05-27 LAB — BASIC METABOLIC PANEL
Anion gap: 7 (ref 5–15)
BUN: 15 mg/dL (ref 8–23)
CO2: 31 mmol/L (ref 22–32)
Calcium: 9.3 mg/dL (ref 8.9–10.3)
Chloride: 101 mmol/L (ref 98–111)
Creatinine, Ser: 1.18 mg/dL (ref 0.61–1.24)
GFR, Estimated: >60 mL/min (ref >60)
Glucose, Bld: 105 mg/dL — ABNORMAL HIGH (ref 70–99)
Potassium: 3.8 mmol/L (ref 3.5–5.1)
Sodium: 139 mmol/L (ref 135–145)

## 2021-05-27 LAB — CBC
HCT: 44.7 % (ref 39.0–52.0)
Hemoglobin: 15 g/dL (ref 13.0–17.0)
MCH: 31.1 pg (ref 26.0–34.0)
MCHC: 33.6 g/dL (ref 30.0–36.0)
MCV: 92.7 fL (ref 80.0–100.0)
Platelets: 386 10*3/uL (ref 150–400)
RBC: 4.82 MIL/uL (ref 4.22–5.81)
RDW: 13.1 % (ref 11.5–15.5)
WBC: 11.7 10*3/uL — ABNORMAL HIGH (ref 4.0–10.5)
nRBC: 0 % (ref 0.0–0.2)

## 2021-05-27 LAB — PROCALCITONIN: Procalcitonin: 0.48 ng/mL

## 2021-05-27 LAB — MAGNESIUM: Magnesium: 1.9 mg/dL (ref 1.7–2.4)

## 2021-05-27 MED ORDER — CHLORHEXIDINE GLUCONATE 0.12 % MT SOLN
15.0000 mL | Freq: Two times a day (BID) | OROMUCOSAL | Status: DC
  Administered 2021-05-27 (×2): 15 mL via OROMUCOSAL
  Filled 2021-05-27 (×2): qty 15

## 2021-05-27 MED ORDER — ALBUTEROL SULFATE HFA 108 (90 BASE) MCG/ACT IN AERS: 2.0000 | INHALATION_SPRAY | RESPIRATORY_TRACT | Status: DC | PRN

## 2021-05-27 MED ORDER — FUROSEMIDE 10 MG/ML IJ SOLN
40.0000 mg | Freq: Two times a day (BID) | INTRAMUSCULAR | Status: DC
  Administered 2021-05-27 (×2): 40 mg via INTRAVENOUS
  Filled 2021-05-27 (×2): qty 4

## 2021-05-27 MED ORDER — POTASSIUM CHLORIDE CRYS ER 20 MEQ PO TBCR
40.0000 meq | EXTENDED_RELEASE_TABLET | Freq: Two times a day (BID) | ORAL | Status: DC
  Administered 2021-05-27 (×2): 40 meq via ORAL
  Filled 2021-05-27 (×3): qty 2

## 2021-05-27 MED ORDER — NICOTINE 21 MG/24HR TD PT24
21.0000 mg | MEDICATED_PATCH | Freq: Every day | TRANSDERMAL | Status: DC
  Administered 2021-05-27 – 2021-05-28 (×2): 21 mg via TRANSDERMAL
  Filled 2021-05-27 (×2): qty 1

## 2021-05-27 MED ORDER — ALBUTEROL SULFATE (2.5 MG/3ML) 0.083% IN NEBU
2.5000 mg | INHALATION_SOLUTION | RESPIRATORY_TRACT | Status: DC | PRN
Start: 2021-05-27 — End: 2021-05-28

## 2021-05-27 MED ORDER — ORAL CARE MOUTH RINSE
15.0000 mL | Freq: Two times a day (BID) | OROMUCOSAL | Status: DC
  Administered 2021-05-27: 15 mL via OROMUCOSAL

## 2021-05-27 NOTE — Progress Notes (Signed)
?Cardiology Progress Note  ?Patient ID: [name] ?MRN: [id number] ?DOB: Apr 18, 1955 ?Date of Encounter: 05/27/2021 ? ?Primary Cardiologist: Sinclair Grooms, MD ? ?Subjective  ? ?Chief Complaint: Shortness of breath ? ?HPI: Oxygen requirement going down with diuresis.  Chest x-ray appears to be improving on my review.  Kidney function has improved.  Still requiring 3 L nasal cannula. ? ?ROS:  ?All other ROS reviewed and negative. Pertinent positives noted in the HPI.    ? ?Inpatient Medications  ?Scheduled Meds: ? amiodarone  200 mg Oral Daily  ? busPIRone  5 mg Oral TID  ? chlorhexidine  15 mL Mouth Rinse BID  ? furosemide  40 mg Intravenous BID  ? mouth rinse  15 mL Mouth Rinse q12n4p  ? metoprolol tartrate  50 mg Oral BID  ? nitroGLYCERIN  1 inch Topical Once  ? potassium chloride  40 mEq Oral BID  ? rivaroxaban  20 mg Oral Q supper  ? rosuvastatin  20 mg Oral QHS  ? ?Continuous Infusions: ? ?PRN Meds: ?acetaminophen **OR** acetaminophen, hydrALAZINE, magnesium hydroxide, ondansetron **OR** ondansetron (ZOFRAN) IV, traZODone  ? ?Vital Signs  ? ?Vitals:  ? 05/26/21 2215 05/26/21 2330 05/27/21 0004 05/27/21 0437  ?BP: (!) 161/87  116/73 134/77  ?Pulse: 84 65 65 72  ?Resp:  16 15 16   ?Temp:   99 ?F (37.2 ?C) 98.5 ?F (36.9 ?C)  ?TempSrc:   Axillary Axillary  ?SpO2:  100% 98% 100%  ?Weight:    70.1 kg  ?Height:      ? ? ?Intake/Output Summary (Last 24 hours) at 05/27/2021 0646 ?Last data filed at 05/27/2021 0439 ?Gross per 24 hour  ?Intake --  ?Output 3375 ml  ?Net -3375 ml  ? ? ?  05/27/2021  ?  4:37 AM 05/26/2021  ?  6:39 AM 05/25/2021  ? 11:24 PM  ?Last 3 Weights  ?Weight (lbs) 154 lb 8.7 oz 159 lb 13.3 oz 152 lb 1.9 oz  ?Weight (kg) 70.1 kg 72.5 kg 69 kg  ?   ? ?Telemetry  ?Overnight telemetry shows sinus bradycardia heart rate 40-50 bpm with intermittent A-fib, which I personally reviewed.  ? ? ?Physical Exam  ? ?Vitals:  ? 05/26/21 2215 05/26/21 2330 05/27/21 0004 05/27/21 0437  ?BP: (!) 161/87  116/73 134/77   ?Pulse: 84 65 65 72  ?Resp:  16 15 16   ?Temp:   99 ?F (37.2 ?C) 98.5 ?F (36.9 ?C)  ?TempSrc:   Axillary Axillary  ?SpO2:  100% 98% 100%  ?Weight:    70.1 kg  ?Height:      ?  ?Intake/Output Summary (Last 24 hours) at 05/27/2021 0646 ?Last data filed at 05/27/2021 0439 ?Gross per 24 hour  ?Intake --  ?Output 3375 ml  ?Net -3375 ml  ?  ? ?  05/27/2021  ?  4:37 AM 05/26/2021  ?  6:39 AM 05/25/2021  ? 11:24 PM  ?Last 3 Weights  ?Weight (lbs) 154 lb 8.7 oz 159 lb 13.3 oz 152 lb 1.9 oz  ?Weight (kg) 70.1 kg 72.5 kg 69 kg  ?  Body mass index is 25.72 kg/m?.  ?General: Well nourished, well developed, in no acute distress ?Head: Atraumatic, normal size  ?Eyes: PEERLA, EOMI  ?Neck: Supple, no JVD ?Endocrine: No thryomegaly ?Cardiac: Normal S1, S2; RRR; no murmurs, rubs, or gallops ?Lungs: Clear to auscultation bilaterally, no wheezing, rhonchi or rales  ?Abd: Soft, nontender, no hepatomegaly  ?Ext: No edema, pulses 2+ ?Musculoskeletal: No deformities, BUE and BLE  strength normal and equal ?Skin: Warm and dry, no rashes   ?Neuro: Alert and oriented to person, place, time, and situation, CNII-XII grossly intact, no focal deficits  ?Psych: Normal mood and affect  ? ?Labs  ?High Sensitivity Troponin:   ?Recent Labs  ?Lab 05/13/21 ?1748 05/14/21 ?0836 05/14/21 ?1036 05/26/21 ?0021 05/26/21 ?[id number]  ?TROPONINIHS 991* 563* 435* 26* 98*  ?   ?Cardiac EnzymesNo results for input(s): TROPONINI in the last 168 hours. No results for input(s): TROPIPOC in the last 168 hours.  ?Chemistry ?Recent Labs  ?Lab 05/26/21 ?0021 05/26/21 ?[id number] 05/27/21 ?[id number]  ?NA 137 138 139  ?K 4.2 4.5 3.8  ?CL 103 104 101  ?CO2 23 26 31   ?GLUCOSE 256* 110* 105*  ?BUN 15 17 15   ?CREATININE 1.67* 1.53* 1.18  ?CALCIUM 8.9 8.9 9.3  ?GFRNONAA 45* 50* >60  ?ANIONGAP 11 8 7   ?  ?Hematology ?Recent Labs  ?Lab 05/26/21 ?0021 05/26/21 ?[id number] 05/27/21 ?[id number]  ?WBC 19.2* 22.1* 11.7*  ?RBC 5.07 4.57 4.82  ?HGB 15.9 14.3 15.0  ?HCT 48.7 43.0 44.7  ?MCV 96.1 94.1 92.7  ?MCH 31.4 31.3  31.1  ?MCHC 32.6 33.3 33.6  ?RDW 13.2 13.2 13.1  ?PLT 490* 409* 386  ? ?BNP ?Recent Labs  ?Lab 05/26/21 ?0021  ?BNP 530.2*  ?  ?DDimer No results for input(s): DDIMER in the last 168 hours.  ? ?Radiology  ?DG CHEST PORT 1 VIEW ? ?Result Date: 05/26/2021 ?CLINICAL DATA:  Pulmonary edema. EXAM: PORTABLE CHEST 1 VIEW COMPARISON:  05/25/2021 FINDINGS: The heart size and mediastinal contours are within normal limits. There is decrease in pulmonary edema since the prior chest x-ray. Mild interstitial edema remains. No focal consolidation, pleural fluid or pneumothorax. The visualized skeletal structures are unremarkable. IMPRESSION: Decrease in pulmonary edema. Electronically Signed   By: Aletta Edouard M.D.   On: 05/26/2021 10:13  ? ?DG Chest Portable 1 View ? ?Result Date: 05/25/2021 ?CLINICAL DATA:  Shortness of breath EXAM: PORTABLE CHEST 1 VIEW COMPARISON:  05/13/2021 FINDINGS: Cardiac shadow is stable. Increased vascular congestion is noted with interstitial edema consistent with CHF. Parenchymal edema is noted as well. No sizable effusion is seen. No focal confluent infiltrate is noted. IMPRESSION: Changes of CHF. Electronically Signed   By: Inez Catalina M.D.   On: 05/25/2021 23:42  ? ?ECHOCARDIOGRAM LIMITED ? ?Result Date: 05/26/2021 ?   ECHOCARDIOGRAM LIMITED REPORT   Patient Name:   [name] Date of Exam: 05/26/2021 Medical Rec #:  [id number]   Height:       65.0 in Accession #:    [id number]  Weight:       159.8 lb Date of Birth:  1955-10-15   BSA:          1.798 m? Patient Age:    66 years    BP:           121/86 mmHg Patient Gender: M           HR:           79 bpm. Exam Location:  Inpatient Procedure: Limited Echo Indications:    CHF  History:        Patient has prior history of Echocardiogram examinations, most                 recent 05/12/2021. Arrythmias:Atrial Fibrillation; Risk                 Factors:Hypertension.  Sonographer:    Jefferey Pica Referring Phys: [phone]  BHAVINKUMAR BHAGAT IMPRESSIONS  1.  Left ventricular ejection fraction, by estimation, is 55 to 60%. The left ventricle has normal function. There is moderate hypertrophy of the basal septal segment. The rest of the LV segments demonstrate mild concentric left ventricular hypertrophy.  2. Right ventricular systolic function is normal. The right ventricular size is normal.  3. Left atrial size was moderately dilated.  4. The mitral valve is abnormal.  5. The aortic valve is tricuspid. There is mild calcification of the aortic valve. There is moderate thickening of the aortic valve.  6. The inferior vena cava is normal in size with greater than 50% respiratory variability, suggesting right atrial pressure of 3 mmHg. Comparison(s): Compared to prior TTE on 04/2021, there is no significant change. FINDINGS  Left Ventricle: Left ventricular ejection fraction, by estimation, is 55 to 60%. The left ventricle has normal function. The left ventricular internal cavity size was normal in size. There is moderate hypertrophy of the basal septal segment. The rest of  the LV segments demonstrate mild concentric left ventricular hypertrophy. Right Ventricle: The right ventricular size is normal. Right ventricular systolic function is normal. Left Atrium: Left atrial size was moderately dilated. Right Atrium: Right atrial size was normal in size. Pericardium: There is no evidence of pericardial effusion. Mitral Valve: The mitral valve is abnormal. There is mild thickening of the mitral valve leaflet(s). There is mild calcification of the mitral valve leaflet(s). Tricuspid Valve: The tricuspid valve is normal in structure. Aortic Valve: The aortic valve is tricuspid. There is mild calcification of the aortic valve. There is moderate thickening of the aortic valve. Aorta: The aortic root and ascending aorta are structurally normal, with no evidence of dilitation. Venous: The inferior vena cava is normal in size with greater than 50% respiratory variability, suggesting  right atrial pressure of 3 mmHg. LEFT VENTRICLE PLAX 2D LVIDd:         4.50 cm LVIDs:         3.20 cm LV PW:         1.20 cm LV IVS:        1.30 cm  IVC IVC diam: 1.20 cm LEFT ATRIUM             Index        R

## 2021-05-27 NOTE — Progress Notes (Signed)
Heart Failure Nurse Navigator Progress Note ? ?Continuing to follow to assess for HV TOC readiness.  ? ?Pt resting in bed on room air-1LPM via [place]. Updated [facility]. Pt states he lives with adult daughter. Smokes 2.5-3PPD, thinking about quitting. Encouraged cessation. Pt would like nicotine patch and gum to aid. Pt does not drink alcohol since 2016. Pt smokes marijuana 2-3x/day. Pt is widowed since 04/2019.  ?Recently hospitalized for HTN urgency, asp PNA and respiratory distress requiring intubation 3/30-4/3.  ?States he has taken all his medication as prescribed since discharge--got meds via [facility]. Endorses dietary indiscretion over weekend holiday, mother made ham and beans and he over indulged. States he loves salt, educated on dietary modifications.  ? ?Will follow to determine HV TOC clinic f/u appt needs.  ? ?Ozella Rocks, MSN, RN ?Heart Failure Nurse Navigator ? ?

## 2021-05-27 NOTE — Plan of Care (Signed)
°  Problem: Coping: °Goal: Level of anxiety will decrease °Outcome: Progressing °  °

## 2021-05-27 NOTE — Progress Notes (Addendum)
?PROGRESS NOTE ? ? ? [name]  [id number] DOB: 28-Jan-1956 DOA: 05/25/2021 ?PCP: Harrison Mons, PA  ? ? ?Brief Narrative:  ?[name] is a 66 y.o. male with past medical history of hypertension, dyslipidemia, paroxysmal atrial fibrillation, CVA and congestive heart failure with recent admission for acute pulmonary edema thought to be related to hypertensive urgency as well as aspiration pneumonia with subsequent acute respiratory failure requiring intubation and mechanical ventilation, from 3/30 till 05/19/21, presented to the hospital this time with acute progressive shortness of breath without any chest pain.  In the emergency department, patient was noted to have BP of 165/126 and later 210/104 with respiratory to 21-22.  Patient was placed on BiPAP due to significant respiratory distress.  Labs showed hyperglycemia and elevated creatinine at 1.6 from 1.0 on 4 3.  BNP was elevated at 530.  CBC showed significant leukocytosis at 19.2.  EKG showed multifocal atrial tachycardia.  Chest x-ray showed pulmonary vascular congestion.  Patient was given Lasix in the ED including Nitropaste and nitro glycerin drip and was admitted to hospital.  PCCM was also consulted initially who recommended admission to medical service with noninvasive ventilation.  ? ?Assessment and Plan: ? ?* Acute pulmonary edema (HCC) ?Likely due to acute on chronic diastolic heart failure from hypertensive urgency.   Repeat chest x-rays showed improved findings.  2D echocardiogram on 05/12/2021 showed EF of 60 to 65%.  Currently on nasal cannula oxygen, was on BiPAP overnight.  Continue to monitor intake and output charting Daily weights.  Was on nitro drip initially which has been changed to nitro patch.  Negative balance of 5275 ml at this time.  Cardiology has seen the patient today and has been restarted on 40 Lasix twice daily. 2D echocardiogram from 05/26/2021 showed LV ejection fraction of 55 to 60%.  We will continue to wean oxygen as  able.   ? ?Acute hypoxic respiratory failure secondary to congestive heart failure exacerbation.  Possibility of underlying COPD from history of chronic smoking.  Was on BiPAP overnight.  On nasal cannula oxygen.  Continue IV diuretics.  Procalcitonin 0.48.  Leukocytosis has trended down significantly.  Patient denies any cough congestion sputum production fever or chills.  X-ray of the chest from today shows improvement.  We will add albuterol, incentive spirometry.  Pulmonary following.  Cardiology recommended CT of the chest.  We will follow pulmonary input on this. ? ?Hypertensive urgency ?Patient was initially put on IV nitroglycerin drip.  Metoprolol 50 twice daily, Lasix 40 twice daily, as needed hydralazine and nitroglycerin ointment.  Blood pressure has improved at this time. ? ?Anxiety ?Continue trazodone, BuSpar. ? ? Paroxysmal atrial fibrillation ?Continue Lopressor and Xarelto .  Cardiology on board. ? ?Elevated troponins.  No chest pain.  Could be secondary to  heart failure respiratory distress.  Did have left heart catheterization with mild nonobstructive CAD  from 05/15/2021 ?  ?Dyslipidemia ?Continue Crestor from home ? ?Chronic nicotine dependence.  Spoke about it.  He states that he is willing to quit.  We will put the patient on nicotine patch for now. ?   ? DVT prophylaxis:  ?rivaroxaban (XARELTO) tablet 20 mg  ? ?Code Status:   ?  Code Status: Full Code ? ?Disposition: Home ? ?Status is: Inpatient ? ?Remains inpatient appropriate because: Acute hypoxic respiratory failure, pulmonary edema, heart failure, required BiPAP ? ? Family Communication:  ?Spoke with the patient at bedside. ? ?Consultants:  ?PCCM ?Cardiology ? ?Procedures:  ?BiPAP ? ?Antimicrobials:  ?None ? ?  Anti-infectives (From admission, onward)  ? ? None  ? ?  ? ?Subjective: ?Today, patient was seen and examined at bedside.  Patient states that he feels little better with breathing this morning.  Denies any chest pain, dizziness,  fever, chills or obvious sputum production.  Patient does have history of chronic smoking and is trying to quit as outpatient.  Denies any history of COPD.  Patient was on BiPAP overnight.  Has been weaned to nasal cannula this morning. ? ?Objective: ?Vitals:  ? 05/26/21 2330 05/27/21 0004 05/27/21 0437 05/27/21 0911  ?BP:  116/73 134/77   ?Pulse: 65 65 72   ?Resp: 16 15 16    ?Temp:  99 ?F (37.2 ?C) 98.5 ?F (36.9 ?C)   ?TempSrc:  Axillary Axillary   ?SpO2: 100% 98% 100% 100%  ?Weight:   70.1 kg   ?Height:      ? ? ?Intake/Output Summary (Last 24 hours) at 05/27/2021 1247 ?Last data filed at 05/27/2021 1009 ?Gross per 24 hour  ?Intake 300 ml  ?Output 2175 ml  ?Net -1875 ml  ? ?Filed Weights  ? 05/25/21 2324 05/26/21 0639 05/27/21 0437  ?Weight: 69 kg 72.5 kg 70.1 kg  ? ? ?Physical Examination: ? ?General:  Average built, not in obvious distress, on nasal cannula oxygen ?HENT:   No scleral pallor or icterus noted. Oral mucosa is moist.  ?Chest:  .  Diminished breath sounds bilaterally.  Coarse breath sounds noted. ?CVS: S1 &S2 heard. No murmur.  Regular rate and rhythm. ?Abdomen: Soft, nontender, nondistended.  Bowel sounds are heard.   ?Extremities: No cyanosis, clubbing or edema.  Peripheral pulses are palpable. ?Psych: Alert, awake and oriented, normal mood ?CNS:  No cranial nerve deficits.  Power equal in all extremities.   ?Skin: Warm and dry.  No rashes noted. ? ?Data Reviewed:  ? ?CBC: ?Recent Labs  ?Lab 05/26/21 ?0021 05/26/21 ?[id number] 05/27/21 ?[id number]  ?WBC 19.2* 22.1* 11.7*  ?NEUTROABS 13.6*  --   --   ?HGB 15.9 14.3 15.0  ?HCT 48.7 43.0 44.7  ?MCV 96.1 94.1 92.7  ?PLT 490* 409* 386  ? ? ?Basic Metabolic Panel: ?Recent Labs  ?Lab 05/26/21 ?0021 05/26/21 ?[id number] 05/27/21 ?[id number]  ?NA 137 138 139  ?K 4.2 4.5 3.8  ?CL 103 104 101  ?CO2 23 26 31   ?GLUCOSE 256* 110* 105*  ?BUN 15 17 15   ?CREATININE 1.67* 1.53* 1.18  ?CALCIUM 8.9 8.9 9.3  ?MG  --   --  1.9  ? ? ?Liver Function Tests: ?No results for input(s): AST, ALT,  ALKPHOS, BILITOT, PROT, ALBUMIN in the last 168 hours. ? ? ?Radiology Studies: ?DG CHEST PORT 1 VIEW ? ?Result Date: 05/27/2021 ?CLINICAL DATA:  Provided history: Pulmonary edema. EXAM: PORTABLE CHEST 1 VIEW COMPARISON:  Prior chest radiographs 05/26/2021 and earlier FINDINGS: Heart size within normal limits. No appreciable significant residual pulmonary edema. A small portion of the left lateral costophrenic angle is excluded from the field of view. No appreciable pleural effusion or evidence of pneumothorax. No acute bony abnormality identified. Degenerative changes of the spine. IMPRESSION: No appreciable significant residual pulmonary edema. A small portion of the left lateral costophrenic angle is excluded from the field of view. Electronically Signed   By: Kellie Simmering D.O.   On: 05/27/2021 08:17  ? ?DG CHEST PORT 1 VIEW ? ?Result Date: 05/26/2021 ?CLINICAL DATA:  Pulmonary edema. EXAM: PORTABLE CHEST 1 VIEW COMPARISON:  05/25/2021 FINDINGS: The heart size and mediastinal contours are within normal limits. There  is decrease in pulmonary edema since the prior chest x-ray. Mild interstitial edema remains. No focal consolidation, pleural fluid or pneumothorax. The visualized skeletal structures are unremarkable. IMPRESSION: Decrease in pulmonary edema. Electronically Signed   By: Aletta Edouard M.D.   On: 05/26/2021 10:13  ? ?DG Chest Portable 1 View ? ?Result Date: 05/25/2021 ?CLINICAL DATA:  Shortness of breath EXAM: PORTABLE CHEST 1 VIEW COMPARISON:  05/13/2021 FINDINGS: Cardiac shadow is stable. Increased vascular congestion is noted with interstitial edema consistent with CHF. Parenchymal edema is noted as well. No sizable effusion is seen. No focal confluent infiltrate is noted. IMPRESSION: Changes of CHF. Electronically Signed   By: Inez Catalina M.D.   On: 05/25/2021 23:42  ? ?ECHOCARDIOGRAM LIMITED ? ?Result Date: 05/26/2021 ?   ECHOCARDIOGRAM LIMITED REPORT   Patient Name:   [name] Date of Exam:  05/26/2021 Medical Rec #:  [id number]   Height:       65.0 in Accession #:    [id number]  Weight:       159.8 lb Date of Birth:  1955/04/20   BSA:          1.798 m? Patient Age:    23 years    BP:           121/86 mmHg

## 2021-05-28 ENCOUNTER — Other Ambulatory Visit (HOSPITAL_COMMUNITY): Payer: Self-pay

## 2021-05-28 LAB — BASIC METABOLIC PANEL
Anion gap: 8 (ref 5–15)
BUN: 19 mg/dL (ref 8–23)
CO2: 27 mmol/L (ref 22–32)
Calcium: 9.5 mg/dL (ref 8.9–10.3)
Chloride: 102 mmol/L (ref 98–111)
Creatinine, Ser: 1.14 mg/dL (ref 0.61–1.24)
GFR, Estimated: >60 mL/min (ref >60)
Glucose, Bld: 109 mg/dL — ABNORMAL HIGH (ref 70–99)
Potassium: 4.4 mmol/L (ref 3.5–5.1)
Sodium: 137 mmol/L (ref 135–145)

## 2021-05-28 LAB — CBC
HCT: 46.3 % (ref 39.0–52.0)
Hemoglobin: 15.7 g/dL (ref 13.0–17.0)
MCH: 31.3 pg (ref 26.0–34.0)
MCHC: 33.9 g/dL (ref 30.0–36.0)
MCV: 92.2 fL (ref 80.0–100.0)
Platelets: 412 10*3/uL — ABNORMAL HIGH (ref 150–400)
RBC: 5.02 MIL/uL (ref 4.22–5.81)
RDW: 13.2 % (ref 11.5–15.5)
WBC: 11.9 10*3/uL — ABNORMAL HIGH (ref 4.0–10.5)
nRBC: 0 % (ref 0.0–0.2)

## 2021-05-28 LAB — MAGNESIUM: Magnesium: 1.8 mg/dL (ref 1.7–2.4)

## 2021-05-28 LAB — PHOSPHORUS: Phosphorus: 3.5 mg/dL (ref 2.5–4.6)

## 2021-05-28 MED ORDER — POTASSIUM CHLORIDE CRYS ER 20 MEQ PO TBCR
20.0000 meq | EXTENDED_RELEASE_TABLET | Freq: Every day | ORAL | 0 refills | Status: DC
  Filled 2021-05-28 (×2): qty 30, 30d supply, fill #0

## 2021-05-28 MED ORDER — FUROSEMIDE 40 MG PO TABS
40.0000 mg | ORAL_TABLET | Freq: Every day | ORAL | Status: DC
  Administered 2021-05-28: 40 mg via ORAL
  Filled 2021-05-28: qty 1

## 2021-05-28 MED ORDER — FUROSEMIDE 40 MG PO TABS
40.0000 mg | ORAL_TABLET | Freq: Every day | ORAL | 3 refills | Status: DC
  Filled 2021-05-28 (×2): qty 30, 30d supply, fill #0

## 2021-05-28 MED ORDER — METOPROLOL TARTRATE 12.5 MG HALF TABLET: 12.5000 mg | ORAL_TABLET | Freq: Two times a day (BID) | ORAL | Status: DC

## 2021-05-28 NOTE — Progress Notes (Signed)
Heart Failure Nurse Navigator Progress Note ? ?H&V TOC appt sch for 4/19 at 12 pm ? ?Meredith Staggers, RN, BSN, CHFN ?Heart Failure Navigator ?Heart & Vascular Care Navigation Team ? ?

## 2021-05-28 NOTE — Progress Notes (Signed)
?Cardiology Progress Note  ?Patient ID: [name] ?MRN: [id number] ?DOB: 09-05-1955 ?Date of Encounter: 05/28/2021 ? ?Primary Cardiologist: Sinclair Grooms, MD ? ?Subjective  ? ?Chief Complaint: None.  ? ?HPI: None.  Euvolemic.  Off oxygen. ? ?ROS:  ?All other ROS reviewed and negative. Pertinent positives noted in the HPI.    ? ?Inpatient Medications  ?Scheduled Meds: ? amiodarone  200 mg Oral Daily  ? busPIRone  5 mg Oral TID  ? chlorhexidine  15 mL Mouth Rinse BID  ? furosemide  40 mg Intravenous BID  ? mouth rinse  15 mL Mouth Rinse q12n4p  ? metoprolol tartrate  12.5 mg Oral BID  ? nicotine  21 mg Transdermal Daily  ? nitroGLYCERIN  1 inch Topical Once  ? potassium chloride  40 mEq Oral BID  ? rivaroxaban  20 mg Oral Q supper  ? rosuvastatin  20 mg Oral QHS  ? ?Continuous Infusions: ? ?PRN Meds: ?acetaminophen **OR** acetaminophen, albuterol, hydrALAZINE, magnesium hydroxide, ondansetron **OR** ondansetron (ZOFRAN) IV, traZODone  ? ?Vital Signs  ? ?Vitals:  ? 05/27/21 2359 05/28/21 0000 05/28/21 0007 05/28/21 0332  ?BP:   135/71 104/85  ?Pulse: (!) 50 (!) 47  (!) 51  ?Resp:    20  ?Temp:   98.1 ?F (36.7 ?C) 98.1 ?F (36.7 ?C)  ?TempSrc:   Oral Oral  ?SpO2: 95% 97%  96%  ?Weight:    69.8 kg  ?Height:      ? ? ?Intake/Output Summary (Last 24 hours) at 05/28/2021 0732 ?Last data filed at 05/28/2021 0335 ?Gross per 24 hour  ?Intake 675 ml  ?Output 2000 ml  ?Net -1325 ml  ? ? ?  05/28/2021  ?  3:32 AM 05/27/2021  ?  4:37 AM 05/26/2021  ?  6:39 AM  ?Last 3 Weights  ?Weight (lbs) 153 lb 14.1 oz 154 lb 8.7 oz 159 lb 13.3 oz  ?Weight (kg) 69.8 kg 70.1 kg 72.5 kg  ?   ? ?Telemetry  ?Overnight telemetry shows SB 40-50 bpm, which I personally reviewed.  ? ?Physical Exam  ? ?Vitals:  ? 05/27/21 2359 05/28/21 0000 05/28/21 0007 05/28/21 0332  ?BP:   135/71 104/85  ?Pulse: (!) 50 (!) 47  (!) 51  ?Resp:    20  ?Temp:   98.1 ?F (36.7 ?C) 98.1 ?F (36.7 ?C)  ?TempSrc:   Oral Oral  ?SpO2: 95% 97%  96%  ?Weight:    69.8 kg  ?Height:       ?  ?Intake/Output Summary (Last 24 hours) at 05/28/2021 0732 ?Last data filed at 05/28/2021 0335 ?Gross per 24 hour  ?Intake 675 ml  ?Output 2000 ml  ?Net -1325 ml  ?  ? ?  05/28/2021  ?  3:32 AM 05/27/2021  ?  4:37 AM 05/26/2021  ?  6:39 AM  ?Last 3 Weights  ?Weight (lbs) 153 lb 14.1 oz 154 lb 8.7 oz 159 lb 13.3 oz  ?Weight (kg) 69.8 kg 70.1 kg 72.5 kg  ?  Body mass index is 25.61 kg/m?.  ?General: Well nourished, well developed, in no acute distress ?Head: Atraumatic, normal size  ?Eyes: PEERLA, EOMI  ?Neck: Supple, no JVD ?Endocrine: No thryomegaly ?Cardiac: Normal S1, S2; RRR; no murmurs, rubs, or gallops ?Lungs: Clear to auscultation bilaterally, no wheezing, rhonchi or rales  ?Abd: Soft, nontender, no hepatomegaly  ?Ext: No edema, pulses 2+ ?Musculoskeletal: No deformities, BUE and BLE strength normal and equal ?Skin: Warm and dry, no rashes   ?Neuro:  Alert and oriented to person, place, time, and situation, CNII-XII grossly intact, no focal deficits  ?Psych: Normal mood and affect  ? ?Labs  ?High Sensitivity Troponin:   ?Recent Labs  ?Lab 05/13/21 ?1748 05/14/21 ?0836 05/14/21 ?1036 05/26/21 ?0021 05/26/21 ?[id number]  ?TROPONINIHS 991* 563* 435* 26* 98*  ?   ?Cardiac EnzymesNo results for input(s): TROPONINI in the last 168 hours. No results for input(s): TROPIPOC in the last 168 hours.  ?Chemistry ?Recent Labs  ?Lab 05/26/21 ?[id number] 05/27/21 ?[id number] 05/28/21 ?0319  ?NA 138 139 137  ?K 4.5 3.8 4.4  ?CL 104 101 102  ?CO2 26 31 27   ?GLUCOSE 110* 105* 109*  ?BUN 17 15 19   ?CREATININE 1.53* 1.18 1.14  ?CALCIUM 8.9 9.3 9.5  ?GFRNONAA 50* >60 >60  ?ANIONGAP 8 7 8   ?  ?Hematology ?Recent Labs  ?Lab 05/26/21 ?[id number] 05/27/21 ?0213 05/28/21 ?0319  ?WBC 22.1* 11.7* 11.9*  ?RBC 4.57 4.82 5.02  ?HGB 14.3 15.0 15.7  ?HCT 43.0 44.7 46.3  ?MCV 94.1 92.7 92.2  ?MCH 31.3 31.1 31.3  ?MCHC 33.3 33.6 33.9  ?RDW 13.2 13.1 13.2  ?PLT 409* 386 412*  ? ?BNP ?Recent Labs  ?Lab 05/26/21 ?0021  ?BNP 530.2*  ?  ?DDimer No results for input(s): DDIMER  in the last 168 hours.  ? ?Radiology  ?DG CHEST PORT 1 VIEW ? ?Result Date: 05/27/2021 ?CLINICAL DATA:  Provided history: Pulmonary edema. EXAM: PORTABLE CHEST 1 VIEW COMPARISON:  Prior chest radiographs 05/26/2021 and earlier FINDINGS: Heart size within normal limits. No appreciable significant residual pulmonary edema. A small portion of the left lateral costophrenic angle is excluded from the field of view. No appreciable pleural effusion or evidence of pneumothorax. No acute bony abnormality identified. Degenerative changes of the spine. IMPRESSION: No appreciable significant residual pulmonary edema. A small portion of the left lateral costophrenic angle is excluded from the field of view. Electronically Signed   By: Kellie Simmering D.O.   On: 05/27/2021 08:17  ? ?DG CHEST PORT 1 VIEW ? ?Result Date: 05/26/2021 ?CLINICAL DATA:  Pulmonary edema. EXAM: PORTABLE CHEST 1 VIEW COMPARISON:  05/25/2021 FINDINGS: The heart size and mediastinal contours are within normal limits. There is decrease in pulmonary edema since the prior chest x-ray. Mild interstitial edema remains. No focal consolidation, pleural fluid or pneumothorax. The visualized skeletal structures are unremarkable. IMPRESSION: Decrease in pulmonary edema. Electronically Signed   By: Aletta Edouard M.D.   On: 05/26/2021 10:13  ? ?ECHOCARDIOGRAM LIMITED ? ?Result Date: 05/26/2021 ?   ECHOCARDIOGRAM LIMITED REPORT   Patient Name:   [name] Date of Exam: 05/26/2021 Medical Rec #:  [id number]   Height:       65.0 in Accession #:    [id number]  Weight:       159.8 lb Date of Birth:  1955/10/11   BSA:          1.798 m? Patient Age:    66 years    BP:           121/86 mmHg Patient Gender: M           HR:           79 bpm. Exam Location:  Inpatient Procedure: Limited Echo Indications:    CHF  History:        Patient has prior history of Echocardiogram examinations, most                 recent 05/12/2021. Arrythmias:Atrial Fibrillation; Risk  Factors:Hypertension.  Sonographer:    Jefferey Pica Referring Phys: [id number] Crista Luria BHAGAT IMPRESSIONS  1. Left ventricular ejection fraction, by estimation, is 55 to 60%. The left ventricle has normal function. There is moderate hypertrophy of the basal septal segment. The rest of the LV segments demonstrate mild concentric left ventricular hypertrophy.  2. Right ventricular systolic function is normal. The right ventricular size is normal.  3. Left atrial size was moderately dilated.  4. The mitral valve is abnormal.  5. The aortic valve is tricuspid. There is mild calcification of the aortic valve. There is moderate thickening of the aortic valve.  6. The inferior vena cava is normal in size with greater than 50% respiratory variability, suggesting right atrial pressure of 3 mmHg. Comparison(s): Compared to prior TTE on 04/2021, there is no significant change. FINDINGS  Left Ventricle: Left ventricular ejection fraction, by estimation, is 55 to 60%. The left ventricle has normal function. The left ventricular internal cavity size was normal in size. There is moderate hypertrophy of the basal septal segment. The rest of  the LV segments demonstrate mild concentric left ventricular hypertrophy. Right Ventricle: The right ventricular size is normal. Right ventricular systolic function is normal. Left Atrium: Left atrial size was moderately dilated. Right Atrium: Right atrial size was normal in size. Pericardium: There is no evidence of pericardial effusion. Mitral Valve: The mitral valve is abnormal. There is mild thickening of the mitral valve leaflet(s). There is mild calcification of the mitral valve leaflet(s). Tricuspid Valve: The tricuspid valve is normal in structure. Aortic Valve: The aortic valve is tricuspid. There is mild calcification of the aortic valve. There is moderate thickening of the aortic valve. Aorta: The aortic root and ascending aorta are structurally normal, with no evidence of  dilitation. Venous: The inferior vena cava is normal in size with greater than 50% respiratory variability, suggesting right atrial pressure of 3 mmHg. LEFT VENTRICLE PLAX 2D LVIDd:         4.50 cm LVIDs:

## 2021-05-28 NOTE — Discharge Summary (Addendum)
Physician Discharge Summary   Patient: Corron Mulloy MRN: [id number] DOB: 04-26-1955  Admit date:     05/25/2021  Discharge date: 05/28/21  Discharge Physician: Rhetta Mura   PCP: Porfirio Oar, PA   Recommendations at discharge:   Needs Chem-12 CBC in about 1 week prior to April 24 appointment with PA Follow-up with cardiology for [facility] visit in about 1 month or as per Dr. Bufford Buttner who will be CCed on this note Smoking sensation is important-please consult patient in the outpatient setting Outpatient further work-up as per pulmonary who saw the patient during hospital stay-May need low-dose CT because he is a continued smoker   Discharge Diagnoses: Principal Problem:   Acute pulmonary edema (HCC) Active Problems:   Hypertensive urgency   Dyslipidemia   PAF (paroxysmal atrial fibrillation) (HCC)   Anxiety   Acute respiratory failure with hypoxia (HCC)  Resolved Problems:   * No resolved hospital problems. *  Hospital Course:  66 year old white male known history HTN HLD P A- flutter CHADVASC >4 on Eliquis chronic daily smoker 3 packs/day Cryptogenic CVA 2016 ILR identifying P A-fib Recent hospitalization 3/27 through 05/19/2021-- acute respiratory failure failing CPAP was intubated and then had NSTEMI with peaking troponins 860s-he completed Unasyn during the hospital stay He was shocked x1 secondary to wide-complex tachycardia and loaded with amiodarone that admission He underwent a left heart cath 3/30 showing no CAD--he was discharged on amiodarone in addition to metoprolol 50 twice daily He was readmitted 05/25/2021 with progressive shortness of breath despite compliance with all meds--placed on nitro gtt., IV Lasix and critical care consulted It was felt that he had flash pulmonary edema--- secondary to his recent acute hospitalization and NSTEMI cardiology was consulted and they made recommendations inclusive of downward adjustment of his beta-blocker because he was  bradycardic and transition to oral Lasix His weight had dropped from 72 to 69.8 kg he lost 6.3 L by day of discharge 4/12 and he was deemed stable for discharge home after being cleared by cardiology He will need a 1 month appointment with Dr. Katrinka Blazing who I will CC-I will also CC Dr. Elberta Fortis who saw this patient last hospitalization I have encouraged him to eat foods rich in potassium and get labs in a week prior to his follow-up visit with his PA at [facility] It was felt his leukocytosis was primarily reactive and this trended down without any further antibiotics or work-up He should get a chest CT given his underlying heavy smoking history He understands clearly the plan         Consultants: Cardiology, critical care Procedures performed:   Disposition: Home Diet recommendation:  Discharge Diet Orders (From admission, onward)     Start     Ordered   05/28/21 0000  Diet - low sodium heart healthy        05/28/21 1005           Renal diet DISCHARGE MEDICATION: Allergies as of 05/28/2021   No Known Allergies      Medication List     STOP taking these medications    busPIRone 5 MG tablet Commonly known as: BUSPAR   metoprolol tartrate 50 MG tablet Commonly known as: LOPRESSOR       TAKE these medications    amiodarone 200 MG tablet Commonly known as: PACERONE Take 1 tablet (200 mg total) by mouth daily. Notes to patient: Controls heart rhythm    furosemide 40 MG tablet Commonly known as: LASIX Take 1 tablet (40 mg  total) by mouth daily. Start taking on: May 29, 2021 Notes to patient: Fluid pill    potassium chloride SA 20 MEQ tablet Commonly known as: KLOR-CON M Take 1 tablet (20 mEq total) by mouth daily. Notes to patient: Replaces potassium  Potassium lost with fluid medication   rosuvastatin 20 MG tablet Commonly known as: CRESTOR Take 1 tablet (20 mg total) by mouth at bedtime. Notes to patient: Cholesterol    Xarelto 20 MG Tabs tablet Generic  drug: rivaroxaban Take 1 tablet (20 mg total) by mouth daily with supper. Notes to patient: Blood thinner  Prevents clotting and stroke         Discharge Exam: Filed Weights   05/26/21 6295 05/27/21 0437 05/28/21 0332  Weight: 72.5 kg 70.1 kg 69.8 kg   Awake coherent disheveled white male no distress no pallor very poor dentition neck soft supple no JVD S1-S2 sinus/rate controlled a flutter Abdomen soft no rebound no guarding No lower extremity edema Neurologically intact Psych euthymic coherent  Condition at discharge: fair  The results of significant diagnostics from this hospitalization (including imaging, microbiology, ancillary and laboratory) are listed below for reference.   Imaging Studies: CARDIAC CATHETERIZATION  Result Date: 05/15/2021   LV end diastolic pressure is normal.   There is no aortic valve stenosis.   Mild, nonobstructive CAD. Continue preventive therapy and management of his acute illness. He had intermittent AFib with RVR during the procedure.  He was in NSR when he left the procedure.    DG CHEST PORT 1 VIEW  Result Date: 05/27/2021 CLINICAL DATA:  Provided history: Pulmonary edema. EXAM: PORTABLE CHEST 1 VIEW COMPARISON:  Prior chest radiographs 05/26/2021 and earlier FINDINGS: Heart size within normal limits. No appreciable significant residual pulmonary edema. A small portion of the left lateral costophrenic angle is excluded from the field of view. No appreciable pleural effusion or evidence of pneumothorax. No acute bony abnormality identified. Degenerative changes of the spine. IMPRESSION: No appreciable significant residual pulmonary edema. A small portion of the left lateral costophrenic angle is excluded from the field of view. Electronically Signed   By: Jackey Loge D.O.   On: 05/27/2021 08:17   DG CHEST PORT 1 VIEW  Result Date: 05/26/2021 CLINICAL DATA:  Pulmonary edema. EXAM: PORTABLE CHEST 1 VIEW COMPARISON:  05/25/2021 FINDINGS: The heart  size and mediastinal contours are within normal limits. There is decrease in pulmonary edema since the prior chest x-ray. Mild interstitial edema remains. No focal consolidation, pleural fluid or pneumothorax. The visualized skeletal structures are unremarkable. IMPRESSION: Decrease in pulmonary edema. Electronically Signed   By: Irish Lack M.D.   On: 05/26/2021 10:13   DG Chest Portable 1 View  Result Date: 05/25/2021 CLINICAL DATA:  Shortness of breath EXAM: PORTABLE CHEST 1 VIEW COMPARISON:  05/13/2021 FINDINGS: Cardiac shadow is stable. Increased vascular congestion is noted with interstitial edema consistent with CHF. Parenchymal edema is noted as well. No sizable effusion is seen. No focal confluent infiltrate is noted. IMPRESSION: Changes of CHF. Electronically Signed   By: Alcide Clever M.D.   On: 05/25/2021 23:42   DG CHEST PORT 1 VIEW  Result Date: 05/13/2021 CLINICAL DATA:  Central line placement. EXAM: PORTABLE CHEST 1 VIEW COMPARISON:  May 13, 2021 (3:55 p.m.) FINDINGS: Since the prior study there is been interval placement of a left-sided venous catheter. Its distal tip sits within the superior vena cava, approximally 2.4 cm proximal to the junction of the superior vena cava and right atrium. Stable  endotracheal tube and nasogastric tube positioning is noted. Stable diffuse bilateral airspace disease is seen. There is no evidence of a pleural effusion or pneumothorax. The heart size and mediastinal contours are within normal limits. Degenerative changes seen throughout the thoracic spine. IMPRESSION: Interval left-sided venous catheter placement and positioning, as described above, without additional significant interval changes. Electronically Signed   By: Aram Candela M.D.   On: 05/13/2021 19:42   DG Chest Port 1 View  Result Date: 05/13/2021 CLINICAL DATA:  Encounter for intubation EXAM: PORTABLE CHEST 1 VIEW COMPARISON:  05/13/2021 FINDINGS: Endotracheal tube in good position.  NG tube tip not visualized off the lower edge of the image. Progression of symmetric and diffuse bilateral airspace disease most likely pulmonary edema. Bibasilar atelectasis. Lung bases not included IMPRESSION: Endotracheal tube in good position. Interval development of diffuse bilateral airspace disease consistent with pulmonary edema. Electronically Signed   By: Marlan Palau M.D.   On: 05/13/2021 16:08   DG Chest Port 1 View  Result Date: 05/13/2021 CLINICAL DATA:  Respiratory distress. EXAM: PORTABLE CHEST 1 VIEW COMPARISON:  May 12, 2021. FINDINGS: The heart size and mediastinal contours are within normal limits. Both lungs are clear. Endotracheal and nasogastric tubes are unchanged. The visualized skeletal structures are unremarkable. IMPRESSION: Stable support apparatus. No acute cardiopulmonary abnormality seen. Electronically Signed   By: Lupita Raider M.D.   On: 05/13/2021 07:37   DG CHEST PORT 1 VIEW  Result Date: 05/12/2021 CLINICAL DATA:  Difficulty breathing EXAM: PORTABLE CHEST 1 VIEW COMPARISON:  Previous studies including the examination done earlier today FINDINGS: Transverse diameter of heart is slightly increased. Tip of endotracheal tube is proximally 5.9 cm above the carina. Enteric tube is noted traversing the esophagus. There is interval decrease in pulmonary vascular congestion and pulmonary edema. Still, there is residual prominence of interstitial and alveolar markings in the parahilar regions and lower lung fields consistent with residual pulmonary edema. Costophrenic angles are clear. There is no pneumothorax. IMPRESSION: Partial clearing of pulmonary edema. Electronically Signed   By: Ernie Avena M.D.   On: 05/12/2021 14:17   DG Chest Port 1 View  Result Date: 05/12/2021 CLINICAL DATA:  Possible sepsis. EXAM: PORTABLE CHEST 1 VIEW COMPARISON:  11/02/2019 FINDINGS: 0532 hours. Endotracheal tube tip is 4.9 cm above the base of the carina. The NG tube passes into  the stomach with proximal side port below the GE junction. Diffuse hazy bilateral airspace disease noted without substantial pleural effusion. Cardiopericardial silhouette is at upper limits of normal for size. Telemetry leads overlie the chest. IMPRESSION: 1. Endotracheal tube tip is 4.9 cm above the base of the carina. 2. Diffuse hazy bilateral airspace disease. Imaging features suggest pulmonary edema although diffuse infection could have this appearance. Electronically Signed   By: Kennith Center M.D.   On: 05/12/2021 05:49   DG Abd Portable 1V  Result Date: 05/13/2021 CLINICAL DATA:  Orogastric tube placement EXAM: PORTABLE ABDOMEN - 1 VIEW COMPARISON:  Portable exam 1556 hours bowel priors for comparison FINDINGS: Tip of orogastric tube projects over proximal to mid stomach. External pacing leads present. BILATERAL pulmonary infiltrates. Nonobstructive bowel gas pattern. IMPRESSION: Tip of orogastric tube projects over proximal to mid stomach. Electronically Signed   By: Ulyses Southward M.D.   On: 05/13/2021 16:07   ECHOCARDIOGRAM COMPLETE  Result Date: 05/12/2021    ECHOCARDIOGRAM REPORT   Patient Name:   SHAD ANDAYA Date of Exam: 05/12/2021 Medical Rec #:  [id number]   Height:  65.0 in Accession #:    [id number]  Weight:       155.0 lb Date of Birth:  Jan 17, 1956   BSA:          1.775 m Patient Age:    66 years    BP:           92/72 mmHg Patient Gender: M           HR:           101 bpm. Exam Location:  Inpatient Procedure: 2D Echo, Cardiac Doppler, Color Doppler and Intracardiac            Opacification Agent Indications:    I42.1 HYPERTROPHIC CARDIOMYOPATHY  History:        Patient has prior history of Echocardiogram examinations, most                 recent 07/26/2014. Cardiomyopathy, Stroke, Arrythmias:Atrial                 Fibrillation; Risk Factors:Hypertension and Dyslipidemia. VERY                 DIFFICULT ACOUSTIC WINDOWS.  Sonographer:    Festus Barren Referring Phys: [id number] JESSICA MARSHALL   Sonographer Comments: Echo performed with patient supine and on artificial respirator. Image acquisition challenging due to respiratory motion. IMPRESSIONS  1. Extremely limited; essentially no parasternal views and limited views otherwise; LV function appears to be normal using definity; suggest cardiac MRI to better assess.  2. Left ventricular ejection fraction, by estimation, is 60 to 65%. The left ventricle has normal function. Left ventricular endocardial border not optimally defined to evaluate regional wall motion. Left ventricular diastolic function could not be evaluated.  3. Right ventricular systolic function is normal. The right ventricular size is normal.  4. The mitral valve was not well visualized. No evidence of mitral valve regurgitation. No evidence of mitral stenosis.  5. The aortic valve was not well visualized. Aortic valve regurgitation Not well visualized.  6. Pulmonic valve regurgitation Not well visualized.  7. The inferior vena cava is normal in size with greater than 50% respiratory variability, suggesting right atrial pressure of 3 mmHg. Comparison(s): No prior Echocardiogram. FINDINGS  Left Ventricle: Left ventricular ejection fraction, by estimation, is 60 to 65%. The left ventricle has normal function. Left ventricular endocardial border not optimally defined to evaluate regional wall motion. Definity contrast agent was given IV to delineate the left ventricular endocardial borders. The left ventricular internal cavity size was normal in size. Suboptimal image quality limits for assessment of left ventricular hypertrophy. Left ventricular diastolic function could not be evaluated. Right Ventricle: The right ventricular size is normal. Right vetricular wall thickness was not well visualized. Right ventricular systolic function is normal. Left Atrium: Left atrial size was normal in size. Right Atrium: Right atrial size was not well visualized. Pericardium: There is no evidence of  pericardial effusion. Mitral Valve: The mitral valve was not well visualized. No evidence of mitral valve regurgitation. No evidence of mitral valve stenosis. Tricuspid Valve: The tricuspid valve is not well visualized. Tricuspid valve regurgitation is trivial. No evidence of tricuspid stenosis. Aortic Valve: The aortic valve was not well visualized. Aortic valve regurgitation Not well visualized. Aortic valve mean gradient measures 2.0 mmHg. Aortic valve peak gradient measures 3.5 mmHg. Pulmonic Valve: The pulmonic valve was not well visualized. Pulmonic valve regurgitation Not well visualized. No evidence of pulmonic stenosis. Aorta: The aortic root was not well visualized. Venous: The  inferior vena cava is normal in size with greater than 50% respiratory variability, suggesting right atrial pressure of 3 mmHg. IAS/Shunts: The interatrial septum was not well visualized. Additional Comments: Extremely limited; essentially no parasternal views and limited views otherwise; LV function appears to be normal using definity; suggest cardiac MRI to better assess.  RIGHT VENTRICLE            IVC RV S prime:     7.40 cm/s  IVC diam: 1.50 cm LEFT ATRIUM             Index LA Vol (A2C):   41.6 ml 23.44 ml/m LA Vol (A4C):   25.5 ml 14.37 ml/m LA Biplane Vol: 33.0 ml 18.59 ml/m  AORTIC VALVE AV Vmax:           93.40 cm/s AV Vmean:          61.900 cm/s AV VTI:            0.147 m AV Peak Grad:      3.5 mmHg AV Mean Grad:      2.0 mmHg LVOT Vmax:         64.50 cm/s LVOT Vmean:        39.900 cm/s LVOT VTI:          0.111 m LVOT/AV VTI ratio: 0.76 MITRAL VALVE MV Area (PHT): 3.08 cm    SHUNTS MV Decel Time: 246 msec    Systemic VTI: 0.11 m MR Peak grad: 82.8 mmHg MR Mean grad: 53.0 mmHg MR Vmax:      455.00 cm/s MR Vmean:     339.0 cm/s MV E velocity: 95.20 cm/s MV A velocity: 99.50 cm/s MV E/A ratio:  0.96 Olga Millers MD Electronically signed by Olga Millers MD Signature Date/Time: 05/12/2021/1:38:00 PM    Final     ECHOCARDIOGRAM LIMITED  Result Date: 05/26/2021    ECHOCARDIOGRAM LIMITED REPORT   Patient Name:   SAHEJ AMBERSON Date of Exam: 05/26/2021 Medical Rec #:  [id number]   Height:       65.0 in Accession #:    [id number]  Weight:       159.8 lb Date of Birth:  1955/07/19   BSA:          1.798 m Patient Age:    66 years    BP:           121/86 mmHg Patient Gender: M           HR:           79 bpm. Exam Location:  Inpatient Procedure: Limited Echo Indications:    CHF  History:        Patient has prior history of Echocardiogram examinations, most                 recent 05/12/2021. Arrythmias:Atrial Fibrillation; Risk                 Factors:Hypertension.  Sonographer:    Eduard Roux Referring Phys: [id number] Sharrell Ku BHAGAT IMPRESSIONS  1. Left ventricular ejection fraction, by estimation, is 55 to 60%. The left ventricle has normal function. There is moderate hypertrophy of the basal septal segment. The rest of the LV segments demonstrate mild concentric left ventricular hypertrophy.  2. Right ventricular systolic function is normal. The right ventricular size is normal.  3. Left atrial size was moderately dilated.  4. The mitral valve is abnormal.  5. The aortic valve is tricuspid. There is mild calcification of the aortic  valve. There is moderate thickening of the aortic valve.  6. The inferior vena cava is normal in size with greater than 50% respiratory variability, suggesting right atrial pressure of 3 mmHg. Comparison(s): Compared to prior TTE on 04/2021, there is no significant change. FINDINGS  Left Ventricle: Left ventricular ejection fraction, by estimation, is 55 to 60%. The left ventricle has normal function. The left ventricular internal cavity size was normal in size. There is moderate hypertrophy of the basal septal segment. The rest of  the LV segments demonstrate mild concentric left ventricular hypertrophy. Right Ventricle: The right ventricular size is normal. Right ventricular systolic function  is normal. Left Atrium: Left atrial size was moderately dilated. Right Atrium: Right atrial size was normal in size. Pericardium: There is no evidence of pericardial effusion. Mitral Valve: The mitral valve is abnormal. There is mild thickening of the mitral valve leaflet(s). There is mild calcification of the mitral valve leaflet(s). Tricuspid Valve: The tricuspid valve is normal in structure. Aortic Valve: The aortic valve is tricuspid. There is mild calcification of the aortic valve. There is moderate thickening of the aortic valve. Aorta: The aortic root and ascending aorta are structurally normal, with no evidence of dilitation. Venous: The inferior vena cava is normal in size with greater than 50% respiratory variability, suggesting right atrial pressure of 3 mmHg. LEFT VENTRICLE PLAX 2D LVIDd:         4.50 cm LVIDs:         3.20 cm LV PW:         1.20 cm LV IVS:        1.30 cm  IVC IVC diam: 1.20 cm LEFT ATRIUM             Index        RIGHT ATRIUM           Index LA diam:        3.30 cm 1.84 cm/m   RA Area:     13.30 cm LA Vol (A2C):   77.7 ml 43.21 ml/m  RA Volume:   30.80 ml  17.13 ml/m LA Vol (A4C):   62.6 ml 34.81 ml/m LA Biplane Vol: 74.8 ml 41.59 ml/m   AORTA Ao Root diam: 3.20 cm Ao Asc diam:  3.30 cm Laurance Flatten MD Electronically signed by Laurance Flatten MD Signature Date/Time: 05/26/2021/4:21:37 PM    Final     Microbiology: Results for orders placed or performed during the hospital encounter of 05/12/21  Blood Culture (routine x 2)     Status: None   Collection Time: 05/12/21  5:21 AM   Specimen: BLOOD RIGHT ARM  Result Value Ref Range Status   Specimen Description BLOOD RIGHT ARM  Final   Special Requests   Final    BOTTLES DRAWN AEROBIC AND ANAEROBIC Blood Culture results may not be optimal due to an inadequate volume of blood received in culture bottles   Culture   Final    NO GROWTH 5 DAYS Performed at [facility], [address], [place], [place] [address]     Report Status 05/17/2021 FINAL  Final  Resp Panel by RT-PCR (Flu A&B, Covid) Nasopharyngeal Swab     Status: None   Collection Time: 05/12/21  5:23 AM   Specimen: Nasopharyngeal Swab; Nasopharyngeal(NP) swabs in vial transport medium  Result Value Ref Range Status   SARS Coronavirus 2 by RT PCR NEGATIVE NEGATIVE Final    Comment: (NOTE) SARS-CoV-2 target nucleic acids are NOT DETECTED.  The SARS-CoV-2 RNA is generally detectable in upper respiratory specimens during the acute phase of infection. The lowest concentration of SARS-CoV-2 viral copies this assay can detect is 138 copies/mL. A negative result does not preclude SARS-Cov-2 infection and should not be used as the sole basis for treatment or other patient management decisions. A negative result may occur with  improper specimen collection/handling, submission of specimen other than nasopharyngeal swab, presence of viral mutation(s) within the areas targeted by this assay, and inadequate number of viral copies(<138 copies/mL). A negative result must be combined with clinical observations, patient history, and epidemiological information. The expected result is Negative.  Fact Sheet for Patients:  BloggerCourse.com  Fact Sheet for Healthcare Providers:  SeriousBroker.it  This test is no t yet approved or cleared by the [place] FDA and  has been authorized for detection and/or diagnosis of SARS-CoV-2 by FDA under an Emergency Use Authorization (EUA). This EUA will remain  in effect (meaning this test can be used) for the duration of the COVID-19 declaration under Section 564(b)(1) of the Act, 21 U.S.C.section 360bbb-3(b)(1), unless the authorization is terminated  or revoked sooner.       Influenza A by PCR NEGATIVE NEGATIVE Final   Influenza B by PCR NEGATIVE NEGATIVE Final    Comment: (NOTE) The Xpert Xpress SARS-CoV-2/FLU/RSV plus assay is intended as an aid in  the diagnosis of influenza from Nasopharyngeal swab specimens and should not be used as a sole basis for treatment. Nasal washings and aspirates are unacceptable for Xpert Xpress SARS-CoV-2/FLU/RSV testing.  Fact Sheet for Patients: BloggerCourse.com  Fact Sheet for Healthcare Providers: SeriousBroker.it  This test is not yet approved or cleared by the [place] FDA and has been authorized for detection and/or diagnosis of SARS-CoV-2 by FDA under an Emergency Use Authorization (EUA). This EUA will remain in effect (meaning this test can be used) for the duration of the COVID-19 declaration under Section 564(b)(1) of the Act, 21 U.S.C. section 360bbb-3(b)(1), unless the authorization is terminated or revoked.  Performed at [facility], [address], [place], [place] [address]   Blood Culture (routine x 2)     Status: None   Collection Time: 05/12/21  5:49 AM   Specimen: BLOOD LEFT HAND  Result Value Ref Range Status   Specimen Description BLOOD LEFT HAND  Final   Special Requests   Final    AEROBIC BOTTLE ONLY Blood Culture results may not be optimal due to an inadequate volume of blood received in culture bottles   Culture   Final    NO GROWTH 5 DAYS Performed at [facility], [address] [place], [place] [address]    Report Status 05/17/2021 FINAL  Final  Urine Culture     Status: None   Collection Time: 05/12/21  6:07 AM   Specimen: In/Out Cath Urine  Result Value Ref Range Status   Specimen Description IN/OUT CATH URINE  Final   Special Requests NONE  Final   Culture   Final    NO GROWTH Performed at [facility], [address], [place], [place] [address]    Report Status 05/13/2021 FINAL  Final  MRSA Next Gen by PCR, Nasal     Status: None   Collection Time: 05/12/21  9:00 AM   Specimen: Nasal Mucosa; Nasal Swab  Result Value Ref Range Status   MRSA by PCR Next Gen NOT DETECTED NOT  DETECTED Final    Comment: (NOTE) The GeneXpert  MRSA Assay (FDA approved for NASAL specimens only), is one component of a comprehensive MRSA colonization surveillance program. It is not intended to diagnose MRSA infection nor to guide or monitor treatment for MRSA infections. Test performance is not FDA approved in patients less than 90 years old. Performed at [facility], [address] [place], [place] [address]     Labs: CBC: Recent Labs  Lab 05/26/21 0021 05/26/21 0649 05/27/21 0213 05/28/21 0319  WBC 19.2* 22.1* 11.7* 11.9*  NEUTROABS 13.6*  --   --   --   HGB 15.9 14.3 15.0 15.7  HCT 48.7 43.0 44.7 46.3  MCV 96.1 94.1 92.7 92.2  PLT 490* 409* 386 412*   Basic Metabolic Panel: Recent Labs  Lab 05/26/21 0021 05/26/21 0545 05/27/21 0213 05/28/21 0319  NA 137 138 139 137  K 4.2 4.5 3.8 4.4  CL 103 104 101 102  CO2 23 26 31 27   GLUCOSE 256* 110* 105* 109*  BUN 15 17 15 19   CREATININE 1.67* 1.53* 1.18 1.14  CALCIUM 8.9 8.9 9.3 9.5  MG  --   --  1.9 1.8  PHOS  --   --   --  3.5   Liver Function Tests: No results for input(s): AST, ALT, ALKPHOS, BILITOT, PROT, ALBUMIN in the last 168 hours. CBG: No results for input(s): GLUCAP in the last 168 hours.  Discharge time spent: greater than 30 minutes.  Signed: Rhetta Mura, MD [facility] 05/28/2021

## 2021-05-28 NOTE — Progress Notes (Addendum)
Heart Failure Navigation Team ?Progress Note ? ?PCP: Harrison Mons, PA ?Primary Cardiologist: Daneen Schick, MD ?Admitted from: home ? ?Past Medical History:  ?Diagnosis Date  ? AKI (acute kidney injury) ([place]) 03/20/2015  ? Alcohol use with alcohol-induced mood disorder ([place])   ? quit almost all ETOH in 07/2014, previously drank 18 to 20 beers daily.   ? Alterations of sensations following CVA (cerebrovascular accident) 09/10/2014  ? Arthritis   ? Colon polyps 2011  ? Hyperplastic and 1 tubular adenoma.  Dr Collene Mares  ? 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 a. identified on TEE at time of stroke  ? Embolic stroke involving right middle cerebral artery ([place]) 10/29/2014  ? Gout   ? Hyperlipidemia   ? Hypertension   ? Paroxysmal atrial fibrillation (HCC)   ? a. identified on LINQ 08/2014  ? Stroke [facility]) 07/25/14  ? a. s/p MDT ILR implant.  on xarelto  ? ? ?Social History  ? ?Socioeconomic History  ? Marital status: Widowed  ?  Spouse name: Marcie Bal  ? Number of children: 3  ? Years of education: 35  ? Highest education level: 8th grade  ?Occupational History  ? Occupation: former Aeronautical engineer  ?  Comment: disabled due to CVA  ?Tobacco Use  ? Smoking status: Every Day  ?  Packs/day: 2.50  ?  Years: 40.00  ?  Pack years: 100.00  ?  Types: Cigarettes  ? Smokeless tobacco: Never  ? Tobacco comments:  ?  cut back after stroke, but increased again due to depression  ?Vaping Use  ? Vaping Use: Never used  ?Substance and Sexual Activity  ? Alcohol use: Not Currently  ? Drug use: Yes  ?  Frequency: 14.0 times per week  ?  Types: Marijuana  ?  Comment: trying to help his appetite  ? Sexual activity: Yes  ?  Partners: Female  ?Other Topics Concern  ? Not on file  ?Social History Narrative  ? Lives at home with wife, Marcie Bal  ? Caffeine use - tea 4-5 glasses a day  ? ?Social Determinants of Health  ? ?Financial Resource Strain: High Risk  ? Difficulty of Paying Living Expenses: Hard  ?Food Insecurity: No Food Insecurity  ? Worried About Charity fundraiser in  the Last Year: Never true  ? Ran Out of Food in the Last Year: Never true  ?Transportation Needs: No Transportation Needs  ? Lack of Transportation (Medical): No  ? Lack of Transportation (Non-Medical): No  ?Physical Activity: Not on file  ?Stress: Not on file  ?Social Connections: Not on file  ? ? ? ?Heart & Vascular Transition of Care Clinic follow-up: ?Scheduled for 06/04/21@12pm .  ?Confirmed transportation. ? ?Immediate social needs: asking about nicotine gum. ? ?HF CSW spoke with Mr. Delashmit at bedside and provided him with the social workers name and position and an appointment card for the [facility] outpatient clinic and encouraged him to follow up and to attend the appointment and bring his medications and if anything changes to please reach out so that CSW/HV clinic team can provide support. Mr. Dobin reports that he is agreeable to attend the [facility] outpatient appointment. ? ?Bee, MSW, LCSW ?[phone] ?Heart Failure Social Worker  ?  ?

## 2021-05-28 NOTE — TOC Transition Note (Signed)
Transition of Care (TOC) - CM/SW Discharge Note ? ? ?Patient Details  ?Name: [name] ?MRN: [id number] ?Date of Birth: 11-24-1955 ? ?Transition of Care (TOC) CM/SW Contact:  ?Pollie Friar, RN ?Phone Number: ?05/28/2021, 10:15 AM ? ? ?Clinical Narrative:    ?Patient is discharging home with self care. No needs per TOC. ? ? ?Final next level of care: Home/Self Care ?Barriers to Discharge: No Barriers Identified ? ? ?Patient Goals and CMS Choice ?  ?  ?  ? ?Discharge Placement ?  ?           ?  ?  ?  ?  ? ?Discharge Plan and Services ?  ?  ?           ?  ?  ?  ?  ?  ?  ?  ?  ?  ?  ? ?Social Determinants of Health (SDOH) Interventions ?Food Insecurity Interventions: Intervention Not Indicated ?Financial Strain Interventions: Intervention Not Indicated ?Housing Interventions: Intervention Not Indicated ?Transportation Interventions: Intervention Not Indicated ? ? ?Readmission Risk Interventions ?   ? View : No data to display.  ?  ?  ?  ? ? ? ? ? ?

## 2021-05-28 NOTE — Progress Notes (Signed)
Upon entering the room, the patient was resting comfortably with his eyes closed. His bedside monitor indicate SB in 44-47 with elevated t waves. Patient denies pain or palpations.EKG done and added to the chart. His decreased HR is consistent with metoprolol that was given with his nightly medication. Rosita Fire MD with Cardiology was made aware. No new orders at this time. Will continue to monitor. ?

## 2021-06-04 ENCOUNTER — Ambulatory Visit (HOSPITAL_COMMUNITY)
Admit: 2021-06-04 | Discharge: 2021-06-04 | Disposition: A | Discharge status: Home / Self Care | Payer: Medicare Other | Source: Ambulatory Visit | Attending: Cardiology | Admitting: Cardiology

## 2021-06-04 ENCOUNTER — Encounter (HOSPITAL_COMMUNITY): Payer: Self-pay

## 2021-06-04 ENCOUNTER — Other Ambulatory Visit (HOSPITAL_COMMUNITY): Payer: Self-pay

## 2021-06-04 ENCOUNTER — Telehealth (HOSPITAL_COMMUNITY): Payer: Self-pay | Admitting: *Deleted

## 2021-06-04 VITALS — BP 170/92 | HR 71 | Ht 66.0 in | Wt 154.8 lb

## 2021-06-04 DIAGNOSIS — Z79899 Other long term (current) drug therapy: Secondary | ICD-10-CM | POA: Insufficient documentation

## 2021-06-04 DIAGNOSIS — I495 Sick sinus syndrome: Secondary | ICD-10-CM | POA: Insufficient documentation

## 2021-06-04 DIAGNOSIS — I5032 Chronic diastolic (congestive) heart failure: Secondary | ICD-10-CM | POA: Insufficient documentation

## 2021-06-04 DIAGNOSIS — I110 Hypertensive heart disease with heart failure: Secondary | ICD-10-CM | POA: Diagnosis not present

## 2021-06-04 DIAGNOSIS — I480 Paroxysmal atrial fibrillation: Secondary | ICD-10-CM | POA: Diagnosis not present

## 2021-06-04 DIAGNOSIS — Q242 Cor triatriatum: Secondary | ICD-10-CM | POA: Diagnosis not present

## 2021-06-04 DIAGNOSIS — E785 Hyperlipidemia, unspecified: Secondary | ICD-10-CM | POA: Insufficient documentation

## 2021-06-04 DIAGNOSIS — Z7901 Long term (current) use of anticoagulants: Secondary | ICD-10-CM | POA: Insufficient documentation

## 2021-06-04 DIAGNOSIS — I4892 Unspecified atrial flutter: Secondary | ICD-10-CM | POA: Insufficient documentation

## 2021-06-04 DIAGNOSIS — F17210 Nicotine dependence, cigarettes, uncomplicated: Secondary | ICD-10-CM | POA: Insufficient documentation

## 2021-06-04 DIAGNOSIS — I10 Essential (primary) hypertension: Secondary | ICD-10-CM

## 2021-06-04 DIAGNOSIS — I2510 Atherosclerotic heart disease of native coronary artery without angina pectoris: Secondary | ICD-10-CM | POA: Insufficient documentation

## 2021-06-04 DIAGNOSIS — Z8673 Personal history of transient ischemic attack (TIA), and cerebral infarction without residual deficits: Secondary | ICD-10-CM | POA: Insufficient documentation

## 2021-06-04 LAB — BASIC METABOLIC PANEL
Anion gap: 10 (ref 5–15)
BUN: 12 mg/dL (ref 8–23)
CO2: 28 mmol/L (ref 22–32)
Calcium: 9.4 mg/dL (ref 8.9–10.3)
Chloride: 96 mmol/L — ABNORMAL LOW (ref 98–111)
Creatinine, Ser: 1.07 mg/dL (ref 0.61–1.24)
GFR, Estimated: >60 mL/min (ref >60)
Glucose, Bld: 90 mg/dL (ref 70–99)
Potassium: 3.9 mmol/L (ref 3.5–5.1)
Sodium: 134 mmol/L — ABNORMAL LOW (ref 135–145)

## 2021-06-04 MED ORDER — ROSUVASTATIN CALCIUM 20 MG PO TABS
20.0000 mg | ORAL_TABLET | Freq: Every day | ORAL | 3 refills | Status: DC
  Filled 2021-06-04 – 2021-06-11 (×2): qty 30, 30d supply, fill #0

## 2021-06-04 MED ORDER — SPIRONOLACTONE 25 MG PO TABS
12.5000 mg | ORAL_TABLET | Freq: Every day | ORAL | 2 refills | Status: DC
Start: 2021-06-04 — End: 2021-10-15
  Filled 2021-06-04: qty 15, 30d supply, fill #0
  Filled 2021-07-15: qty 15, 30d supply, fill #1
  Filled 2021-08-25: qty 15, 30d supply, fill #2

## 2021-06-04 MED ORDER — RIVAROXABAN 20 MG PO TABS
20.0000 mg | ORAL_TABLET | Freq: Every day | ORAL | 3 refills | Status: DC
  Filled 2021-06-04 – 2021-06-11 (×2): qty 30, 30d supply, fill #0
  Filled 2021-07-15 – 2021-08-01 (×2): qty 30, 30d supply, fill #1
  Filled 2021-09-17: qty 30, 30d supply, fill #2
  Filled 2021-10-15: qty 30, 30d supply, fill #3

## 2021-06-04 MED ORDER — POTASSIUM CHLORIDE CRYS ER 20 MEQ PO TBCR
20.0000 meq | EXTENDED_RELEASE_TABLET | Freq: Every day | ORAL | 3 refills | Status: DC
  Filled 2021-06-04 – 2021-06-27 (×2): qty 30, 30d supply, fill #0
  Filled 2021-07-31: qty 30, 30d supply, fill #1
  Filled 2021-08-25: qty 30, 30d supply, fill #2
  Filled 2021-10-15: qty 30, 30d supply, fill #3

## 2021-06-04 MED ORDER — FUROSEMIDE 40 MG PO TABS
40.0000 mg | ORAL_TABLET | Freq: Every day | ORAL | 3 refills | Status: DC
  Filled 2021-06-04 – 2021-06-13 (×3): qty 30, 30d supply, fill #0

## 2021-06-04 MED ORDER — AMIODARONE HCL 200 MG PO TABS
200.0000 mg | ORAL_TABLET | Freq: Every day | ORAL | 3 refills | Status: DC
  Filled 2021-06-04 – 2021-06-11 (×2): qty 30, 30d supply, fill #0

## 2021-06-04 NOTE — Progress Notes (Signed)
ReDS Vest / Clip - 06/04/21 1220   ? ?  ? ReDS Vest / Clip  ? Station Marker C   ? Ruler Value 27   ? ReDS Value Range Low volume   ? ReDS Actual Value 35   ? Anatomical Comments sitting   ? ?  ?  ? ?  ?  ?

## 2021-06-04 NOTE — Telephone Encounter (Signed)
Call attempted to confirm HV TOC appt 06/04/21 @ 12 noon. Marland Kitchen HIPPA appropriate VM left with callback number.   ? ? ?Rhae Hammock, BSN, RN ?Heart Failure Nurse Navigator ?Secure Chat Only  ?

## 2021-06-04 NOTE — Addendum Note (Signed)
Encounter addended by: Allayne Butcher, PA-C on: 06/04/2021 11:34 PM ? Actions taken: Clinical Note Signed

## 2021-06-04 NOTE — Progress Notes (Addendum)
? ? ?HEART & VASCULAR TRANSITION OF CARE CONSULT NOTE  ? ? ? ?Referring Physician: Dr. Flora Lipps  ?Primary Care: Porfirio Oar, PA  ?Primary Cardiologist: Lesleigh Noe, MD ? ? ?HPI: ?Referred to clinic by Dr. Flora Lipps for heart failure consultation.  ? ?66 y/o male w/ chronic diastolic heart failure, labile BP, tobacco use, HLD, CVA and atrial fibrillation.  ? ?Had cryptogenic stroke in 2016.  Placed on implantable loop recorder with finding of atrial fibrillation. Placed on Xarelto for anticoagulation. Echo then showed normal LVEF, 65-70%, normal RV. 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 ? ?He has had 2 recent admissions for acute flash pulmonary edema/hypertensive urgency in the last 4 weeks.  ? ?1st Admit 3/24-3/27: Presented w/ respiratory distress. Did not tolerated CPAP and placed on 15 L nonrebreather.  Intubated in the emergency room.  Admitted for acute hypoxic respiratory failure in setting of flash pulmonary edema due to hypertensive urgency. He was also in Afib and felt to have possible PNA. Treated with broad-spectrum antibiotic.  Placed on heparin for elevated HS troponin 17>>873>>861>>731. Echo showed normal LVEF, 65%. TSH normal. UDS + for THC and Benzos.  Admission also notable for intermittent atrial flutter/fibrillation and development of a WCT that required emergent bedside defibrillation. Placed on IV amio and later transitioned to PO. LHC was done to r/o ischemic substrate. This showed mild, nonobstructive CAD. LVEDP was normal.  EP was also consulted for recommendations and recommend continuation of amio and ? blocker . His BP ultimately normalized and was actually running in low normal range with only little medications. Given this, renal artery stenosis was felt to be an unlikely culprit for pulmonary edema given normalization of pressures. He was discharged home on Metoprolol 50, amiodarone 200, Xarelto 20 and Crestor 20. Discharge home on 4/3.  ? ?Readmitted 4/9:  presented w/ recurrent respiratory distress/ acute hypoxic respiratory failure in setting of flash pulmonary edema and hypertensive urgency. BP was 165/126 and later 210/104. He was in atrial flutter. UDS + for THC only. Required BiPAP, IV Lasix, Nitropaste followed by IV nitroglycerin drip that was later tapered off. SCr had bumped from baseline of 1.03>>1.67. Diuretics later held. Repeat limited echo showed EF 55-60%, normal RV, mid-moderately thickened mitral and aortic valves but no MR nor AI. Similar to previous hospitalization, his BP normalized and returned to low normal range, limiting treatment w/ standing dose antihypertensive. His metoprolol was also later discontinued due to bradycardia. SCr returned to baseline. He was continued on PO amio and PO lasix restarted, 40 mg daily. Referred to [facility] clinic.  ? ?Presents today for f/u. Reports feeling better. No further dyspnea. Denies orthopnea/PND. Reports full med compliance. BP 170/92. Rechecked after period of rest and down to 140/86. He does not have a home BP monitor at home but willing to purchase one. Continues to smoke cigarettes, less than a pack per day. Smoker x 40+ years.  ? ?ReDs clip 35%.   ? ? ?Cardiac Testing  ?2D Echo 05/12/21 ?Extremely limited; essentially no parasternal views and limited views otherwise; LV ?function appears to be normal using definity; suggest cardiac MRI to better assess. ?1. ?Left ventricular ejection fraction, by estimation, is 60 to 65%. The left ventricle has ?normal function. Left ventricular endocardial border not optimally defined to evaluate ?regional wall motion. Left ventricular diastolic function could not be evaluated. ?2. ?3. Right ventricular systolic function is normal. The right ventricular size is normal. ?The mitral valve was not well visualized. No evidence  of mitral valve regurgitation. No ?evidence of mitral stenosis. ?4. ?5. The aortic valve was not well visualized. Aortic valve regurgitation Not well  visualized. ?6. Pulmonic valve regurgitation Not well visualized. ?The inferior vena cava is normal in size with greater than 50% respiratory variability, ?suggesting right atrial pressure of 3 mmHg. ? ?LHC 05/15/21 ?  LV end diastolic pressure is normal. ?  There is no aortic valve stenosis. ?  Mild, nonobstructive CAD. ? ? ?Limited Echo 05/26/21 ?Left ventricular ejection fraction, by estimation, is 55 to 60%. The left ventricle has ?normal function. There is moderate hypertrophy of the basal septal segment. The rest ?of the LV segments demonstrate mild concentric left ventricular hypertrophy. ?1. ?2. Right ventricular systolic function is normal. The right ventricular size is normal. ?3. Left atrial size was moderately dilated. ?4. The mitral valve is abnormal. ?The aortic valve is tricuspid. There is mild calcification of the aortic valve. There is ?moderate thickening of the aortic valve. ?5. ?The inferior vena cava is normal in size with greater than 50% respiratory variability, ?suggesting right atrial pressure of 3 mmHg. ? ? ?Review of Systems: [y] = yes, [ ]  = no  ? ?General: Weight gain [ ] ; Weight loss [ ] ; Anorexia [ ] ; Fatigue [ ] ; Fever [ ] ; Chills [ ] ; Weakness [ ]   ?Cardiac: Chest pain/pressure [ ] ; Resting SOB [ ] ; Exertional SOB [ ] ; Orthopnea [ ] ; Pedal Edema [ ] ; Palpitations [ ] ; Syncope [ ] ; Presyncope [ ] ; Paroxysmal nocturnal dyspnea[ ]   ?Pulmonary: Cough [ ] ; Wheezing[ ] ; Hemoptysis[ ] ; Sputum [ ] ; Snoring [ ]   ?GI: Vomiting[ ] ; Dysphagia[ ] ; Melena[ ] ; Hematochezia [ ] ; Heartburn[ ] ; Abdominal pain [ ] ; Constipation [ ] ; Diarrhea [ ] ; BRBPR [ ]   ?GU: Hematuria[ ] ; Dysuria [ ] ; Nocturia[ ]   ?Vascular: Pain in legs with walking [ ] ; Pain in feet with lying flat [ ] ; Non-healing sores [ ] ; Stroke [ ] ; TIA [ ] ; Slurred speech [ ] ;  ?Neuro: Headaches[ ] ; Vertigo[ ] ; Seizures[ ] ; Paresthesias[ ] ;Blurred vision [ ] ; Diplopia [ ] ; Vision changes [ ]   ?Ortho/Skin: Arthritis [ ] ; Joint pain [ ] ; Muscle  pain [ ] ; Joint swelling [ ] ; Back Pain [ ] ; Rash [ ]   ?Psych: Depression[ ] ; Anxiety[ Y]  ?Heme: Bleeding problems [ ] ; Clotting disorders [ ] ; Anemia [ ]   ?Endocrine: Diabetes [ ] ; Thyroid dysfunction[ ]  ? ? ?Past Medical History:  ?Diagnosis Date  ? AKI (acute kidney injury) ([place]) 03/20/2015  ? Alcohol use with alcohol-induced mood disorder ([place])   ? quit almost all ETOH in 07/2014, previously drank 18 to 20 beers daily.   ? Alterations of sensations following CVA (cerebrovascular accident) 09/10/2014  ? Arthritis   ? Colon polyps 2011  ? Hyperplastic and 1 tubular adenoma.  Dr Collene Mares  ? 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 a. identified on TEE at time of stroke  ? Embolic stroke involving right middle cerebral artery ([place]) 10/29/2014  ? Gout   ? Hyperlipidemia   ? Hypertension   ? Paroxysmal atrial fibrillation (HCC)   ? a. identified on LINQ 08/2014  ? Stroke [facility]) 07/25/14  ? a. s/p MDT ILR implant.  on xarelto  ? ? ?Current Outpatient Medications  ?Medication Sig Dispense Refill  ? spironolactone (ALDACTONE) 25 MG tablet Take 0.5 tablets (12.5 mg total) by mouth daily. 15 tablet 2  ? amiodarone (PACERONE) 200 MG tablet Take 1 tablet (200 mg total) by mouth daily. 30 tablet  3  ? furosemide (LASIX) 40 MG tablet Take 1 tablet (40 mg total) by mouth daily. 30 tablet 3  ? potassium chloride SA (KLOR-CON M) 20 MEQ tablet Take 1 tablet (20 mEq total) by mouth daily. 30 tablet 3  ? rivaroxaban (XARELTO) 20 MG TABS tablet Take 1 tablet (20 mg total) by mouth daily with supper. 30 tablet 3  ? rosuvastatin (CRESTOR) 20 MG tablet Take 1 tablet (20 mg total) by mouth at bedtime. 30 tablet 3  ? ?No current facility-administered medications for this encounter.  ? ? ?No Known Allergies ? ?  ?Social History  ? ?Socioeconomic History  ? Marital status: Widowed  ?  Spouse name: Marcie Bal  ? Number of children: 3  ? Years of education: 67  ? Highest education level: 8th grade  ?Occupational History  ? Occupation: former Aeronautical engineer  ?  Comment: disabled  due to CVA  ?Tobacco Use  ? Smoking status: Every Day  ?  Packs/day: 2.50  ?  Years: 40.00  ?  Pack years: 100.00  ?  Types: Cigarettes  ? Smokeless tobacco: Never  ? Tobacco comments:  ?  cut back after s

## 2021-06-04 NOTE — Patient Instructions (Signed)
START Spironolactone 12.5 mg, (one half tab) daily ? ?Labs today ?We will only contact you if something comes back abnormal or we need to make some changes. ?Otherwise no news is good news! ? ?Labs needed in 7-10 days ? ?Your physician has requested that you regularly monitor and record your blood pressure readings at home. Please use the same machine at the same time of day to check your readings and record them to bring to your follow-up visit. ? ?Your physician recommends that you schedule a follow-up appointment in: 2-3 weeks with the [facility] clinic ? ? ?Do the following things EVERYDAY: ?Weigh yourself in the morning before breakfast. Write it down and keep it in a log. ?Take your medicines as prescribed ?Eat low salt foods--Limit salt (sodium) to 2000 mg per day.  ?Stay as active as you can everyday ?Limit all fluids for the day to less than 2 liters ? ? ?

## 2021-06-04 NOTE — Addendum Note (Signed)
Encounter addended by: Allayne Butcher, PA-C on: 06/04/2021 11:43 PM ? Actions taken: Clinical Note Signed

## 2021-06-04 NOTE — Progress Notes (Signed)
?Heart and Vascular Care Navigation ? ?06/04/2021 ? ?[name] ?13-May-1955 ?[id number] ? ?Reason for Referral: Patient seen in HF TOC ?  ?Engaged with patient face to face for initial visit for Heart and Vascular Care Coordination. ?                                                                                                  ?Assessment:  Patient is a 66 yo male who reports he lives with his daughter and son in law. He has a monthly disability income and Medicare. Patient's son in law present for visit and denies any current SDoH needs although requested pharmacy needs to be local as they have had difficulty with using the mail order. CSW provided HF booklet and educational materials for family.   Patient denies any financial concerns at this time.                                 ? ?HRT/VAS Care Coordination   ? ? Patients Home Cardiology Office Heart Failure Clinic  HF TOC  ? Outpatient Care Team Social Worker  ? Social Worker Name: Lasandra Beech, [place] [phone]  ? Living arrangements for the past 2 months Single Family Home  ? Lives with: Adult Children  Grandchildren  ? Home Assistive Devices/Equipment None  ? DME Agency NA  ? ?  ? ? ?Social History:                                                                             ?SDOH Screenings  ? ?Alcohol Screen: Low Risk   ? Last Alcohol Screening Score (AUDIT): 0  ?Depression (PHQ2-9): Not on file  ?Financial Resource Strain: High Risk  ? Difficulty of Paying Living Expenses: Hard  ?Food Insecurity: No Food Insecurity  ? Worried About Programme researcher, broadcasting/film/video in the Last Year: Never true  ? Ran Out of Food in the Last Year: Never true  ?Housing: Low Risk   ? Last Housing Risk Score: 0  ?Physical Activity: Not on file  ?Social Connections: Not on file  ?Stress: Not on file  ?Tobacco Use: High Risk  ? Smoking Tobacco Use: Every Day  ? Smokeless Tobacco Use: Never  ? Passive Exposure: Not on file  ?Transportation Needs: No Transportation Needs  ? Lack of  Transportation (Medical): No  ? Lack of Transportation (Non-Medical): No  ? ? ?SDOH Interventions: ?Financial Resources:    ?N/a  ?Food Insecurity:   N/a  ?Housing Insecurity:   N/a  ?Transportation:    N/a  ? ? ?Follow-up plan:  CSW provided a med bag and HF booklet. Patient denies any SDoH needs at this time. CSW available as needed. Lasandra Beech, LCSW, CCSW-MCS [phone] ? ? ? ? ? ?

## 2021-06-06 ENCOUNTER — Telehealth (HOSPITAL_COMMUNITY): Payer: Self-pay | Admitting: Cardiology

## 2021-06-06 DIAGNOSIS — I5032 Chronic diastolic (congestive) heart failure: Secondary | ICD-10-CM

## 2021-06-06 NOTE — Addendum Note (Signed)
Encounter addended by: Allayne Butcher, PA-C on: 06/06/2021 3:20 PM ? Actions taken: Clinical Note Signed

## 2021-06-06 NOTE — Telephone Encounter (Signed)
06/04/2021  6:07 PM EDT   ?  ?Labs stable. He is scheduled to return next week for f/u BMP.  ?Please add Iron, ferritin and TIBC as part of HF work-up. Cancel f/u TOC visit and assign directly as new pt w/ Dr. Aundra Dubin in ~3 weeks. He will need cMRI   ? ? ? ? ?Lab  appt updated  ?Orders placed ? ?Also per Lyda Jester, PA ?Will need UPEP, Multiple Myeloma panel ? ?Will arrange f/u with patient at lab appt ?

## 2021-06-11 ENCOUNTER — Telehealth (HOSPITAL_COMMUNITY): Payer: Self-pay | Admitting: *Deleted

## 2021-06-11 ENCOUNTER — Ambulatory Visit (HOSPITAL_COMMUNITY)
Admission: RE | Admit: 2021-06-11 | Discharge: 2021-06-11 | Disposition: A | Discharge status: Home / Self Care | Payer: Medicare Other | Source: Ambulatory Visit | Attending: Internal Medicine | Admitting: Internal Medicine

## 2021-06-11 DIAGNOSIS — I10 Essential (primary) hypertension: Secondary | ICD-10-CM

## 2021-06-11 DIAGNOSIS — I5032 Chronic diastolic (congestive) heart failure: Secondary | ICD-10-CM | POA: Diagnosis present

## 2021-06-11 LAB — IRON AND TIBC
Iron: 90 ug/dL (ref 45–182)
Saturation Ratios: 18 % (ref 17.9–39.5)
TIBC: 503 ug/dL — ABNORMAL HIGH (ref 250–450)
UIBC: 413 ug/dL

## 2021-06-11 LAB — BASIC METABOLIC PANEL
Anion gap: 9 (ref 5–15)
BUN: 19 mg/dL (ref 8–23)
CO2: 29 mmol/L (ref 22–32)
Calcium: 9.9 mg/dL (ref 8.9–10.3)
Chloride: 97 mmol/L — ABNORMAL LOW (ref 98–111)
Creatinine, Ser: 1.23 mg/dL (ref 0.61–1.24)
GFR, Estimated: >60 mL/min (ref >60)
Glucose, Bld: 81 mg/dL (ref 70–99)
Potassium: 4.4 mmol/L (ref 3.5–5.1)
Sodium: 135 mmol/L (ref 135–145)

## 2021-06-11 LAB — FERRITIN: Ferritin: 72 ng/mL (ref 24–336)

## 2021-06-11 NOTE — Addendum Note (Signed)
Encounter addended by: Noralee Space, RN on: 06/11/2021 2:43 PM ? Actions taken: Order list changed, Diagnosis association updated

## 2021-06-11 NOTE — Addendum Note (Signed)
Encounter addended by: Scarlette Calico, RN on: 06/11/2021 3:18 PM ? Actions taken: Order list changed

## 2021-06-11 NOTE — Addendum Note (Signed)
Encounter addended by: Modesta Messing, CMA on: 06/11/2021 3:28 PM ? Actions taken: Order list changed

## 2021-06-12 ENCOUNTER — Other Ambulatory Visit (HOSPITAL_COMMUNITY): Payer: Self-pay

## 2021-06-13 ENCOUNTER — Other Ambulatory Visit (HOSPITAL_COMMUNITY): Payer: Self-pay | Admitting: Cardiology

## 2021-06-13 ENCOUNTER — Other Ambulatory Visit (HOSPITAL_COMMUNITY): Payer: Self-pay

## 2021-06-16 ENCOUNTER — Other Ambulatory Visit (HOSPITAL_COMMUNITY): Payer: Self-pay

## 2021-06-16 LAB — MULTIPLE MYELOMA PANEL, SERUM
Albumin SerPl Elph-Mcnc: 4 g/dL (ref 2.9–4.4)
Albumin/Glob SerPl: 1 (ref 0.7–1.7)
Alpha 1: 0.3 g/dL (ref 0.0–0.4)
Alpha2 Glob SerPl Elph-Mcnc: 0.8 g/dL (ref 0.4–1.0)
B-Globulin SerPl Elph-Mcnc: 1.3 g/dL (ref 0.7–1.3)
Gamma Glob SerPl Elph-Mcnc: 1.9 g/dL — ABNORMAL HIGH (ref 0.4–1.8)
Globulin, Total: 4.4 g/dL — ABNORMAL HIGH (ref 2.2–3.9)
IgA: 306 mg/dL (ref 61–437)
IgG (Immunoglobin G), Serum: 1757 mg/dL — ABNORMAL HIGH (ref 603–1613)
IgM (Immunoglobulin M), Srm: 583 mg/dL — ABNORMAL HIGH (ref 20–172)
Total Protein ELP: 8.4 g/dL (ref 6.0–8.5)

## 2021-06-17 ENCOUNTER — Telehealth (HOSPITAL_COMMUNITY): Payer: Self-pay | Admitting: *Deleted

## 2021-06-25 ENCOUNTER — Ambulatory Visit (HOSPITAL_COMMUNITY): Payer: Medicare Other

## 2021-06-25 NOTE — Addendum Note (Signed)
Encounter addended by: Consuelo Pandy, PA-C on: 06/25/2021 5:01 PM ? Actions taken: Clinical Note Signed

## 2021-06-27 ENCOUNTER — Telehealth (HOSPITAL_COMMUNITY): Payer: Self-pay | Admitting: Emergency Medicine

## 2021-06-27 ENCOUNTER — Other Ambulatory Visit (HOSPITAL_COMMUNITY): Payer: Self-pay

## 2021-06-27 NOTE — Telephone Encounter (Signed)
Unable to leave vm as vm box was full ?Rockwell Alexandria RN Navigator Cardiac Imaging ?[facility] ?[phone] Office  ?[phone] Cell ? ?

## 2021-06-30 ENCOUNTER — Ambulatory Visit: Payer: Medicare Other | Admitting: Cardiology

## 2021-06-30 ENCOUNTER — Other Ambulatory Visit (HOSPITAL_COMMUNITY): Payer: Self-pay

## 2021-06-30 ENCOUNTER — Encounter: Payer: Self-pay | Admitting: Cardiology

## 2021-06-30 ENCOUNTER — Ambulatory Visit (HOSPITAL_COMMUNITY)
Admission: RE | Admit: 2021-06-30 | Discharge: 2021-06-30 | Disposition: A | Discharge status: Home / Self Care | Payer: Medicare Other | Source: Ambulatory Visit | Attending: Cardiology | Admitting: Cardiology

## 2021-06-30 VITALS — BP 98/58 | HR 60 | Ht 66.0 in | Wt 156.8 lb

## 2021-06-30 DIAGNOSIS — I5032 Chronic diastolic (congestive) heart failure: Secondary | ICD-10-CM | POA: Diagnosis present

## 2021-06-30 DIAGNOSIS — I480 Paroxysmal atrial fibrillation: Secondary | ICD-10-CM

## 2021-06-30 DIAGNOSIS — D6869 Other thrombophilia: Secondary | ICD-10-CM

## 2021-06-30 MED ORDER — AMIODARONE HCL 200 MG PO TABS
100.0000 mg | ORAL_TABLET | Freq: Every day | ORAL | 3 refills | Status: DC
  Filled 2021-06-30: qty 45, 90d supply, fill #0
  Filled 2021-11-09: qty 45, 90d supply, fill #1
  Filled 2022-02-22: qty 45, 90d supply, fill #2
  Filled 2022-06-22: qty 45, 90d supply, fill #3

## 2021-06-30 MED ORDER — GADOBUTROL 1 MMOL/ML IV SOLN
7.0000 mL | Freq: Once | INTRAVENOUS | Status: AC | PRN
  Administered 2021-06-30: 7 mL via INTRAVENOUS

## 2021-06-30 NOTE — Patient Instructions (Addendum)
Medication Instructions:  ?Your physician has recommended you make the following change in your medication: ?DECREASE Amiodarone to 100 mg once daily ? ?*If you need a refill on your cardiac medications before your next appointment, please call your pharmacy* ? ? ?Lab Work: ?None ordered ? ? ?Testing/Procedures: ?None ordered ? ? ?Follow-Up: ?At [facility], you and your health needs are our priority.  As part of our continuing mission to provide you with exceptional heart care, we have created designated Provider Care Teams.  These Care Teams include your primary Cardiologist (physician) and Advanced Practice Providers (APPs -  Physician Assistants and Nurse Practitioners) who all work together to provide you with the care you need, when you need it. ? ?Your next appointment:   ?6 month(s) ? ?The format for your next appointment:   ?In Person ? ?Provider:   ?Loman Brooklyn, MD  ? ? ?Thank you for choosing [facility]!! ? ? ?Dory Horn, RN ?[phone] ? ?Other Instructions ? ? ? ?Important Information About Sugar ? ? ? ? ? ? ? ? ?  ?

## 2021-06-30 NOTE — Progress Notes (Signed)
? ?Electrophysiology Office Note ? ? ?Date:  06/30/2021  ? ?ID:  [name], DOB 08-16-1955, MRN [id number] ? ?PCP:  [name] Mons, PA  ?Cardiologist:  [name] ?Primary Electrophysiologist:  [name] Humphrey Meredith Leeds, MD   ? ?Chief Complaint: AF ?  ?History of Present Illness: ?[name] is a 66 y.o. male who is being seen today for the evaluation of AF at the request of [name], [place]. Presenting today for electrophysiology evaluation. ? ?He [name] a past history significant for alcohol abuse, cor triatriatum, right MCA stroke, hypertension, hyperlipidemia, atrial fibrillation.  His stroke was in 2016.  He had a loop monitor that showed atrial fibrillation and he was started on Xarelto.  TEE had an incidental finding of cor triatriatum dextram. ? ?He was admitted to the hospital 05/09/2021 with respiratory distress.  He was intubated.  He was found to have atrial fibrillation and a wide-complex tachycardia.  He was started on amiodarone.  He represented to the hospital 05/25/2021 with recurrent respiratory distress.  He was found to be in atrial flutter.  Serum creatinine was increased.  Amiodarone was continued. ? ?Today, he denies symptoms of palpitations, chest pain, shortness of breath, orthopnea, PND, lower extremity edema, claudication, dizziness, presyncope, syncope, bleeding, or neurologic sequela. The patient is tolerating medications without difficulties.  Since his most recent hospitalization he [name] done well.  He [name] noted no further episodes of atrial fibrillation.  He remains on amiodarone.  He is overall happy with his control. ? ? ?Past Medical History:  ?Diagnosis Date  ? AKI (acute kidney injury) ([place]) 03/20/2015  ? Alcohol use with alcohol-induced mood disorder ([place])   ? quit almost all ETOH in 07/2014, previously drank 18 to 20 beers daily.   ? Alterations of sensations following CVA (cerebrovascular accident) 09/10/2014  ? Arthritis   ? Colon polyps 2011  ? Hyperplastic and 1 tubular adenoma.  [name]   ? 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 a. identified on TEE at time of stroke  ? Embolic stroke involving right middle cerebral artery ([place]) 10/29/2014  ? Gout   ? Hyperlipidemia   ? Hypertension   ? Paroxysmal atrial fibrillation (H[facility])   ? a. identified on LINQ 08/2014  ? Stroke [facility]) 07/25/14  ? a. s/p MDT ILR implant.  on xarelto  ? ?Past Surgical History:  ?Procedure Laterality Date  ? COLONOSCOPY  2011  ? [name].   ? EP IMPLANTABLE DEVICE N/A 07/27/2014  ? Procedure: Loop Recorder Insertion;  Surgeon: [name] Lance, MD;  Location: [place] [facility];  Service: Cardiovascular;  Laterality: N/A;  ? EP IMPLANTABLE DEVICE N/A 12/20/2014  ? Procedure: Loop Recorder Removal;  Surgeon: [name] Lance, MD;  Location: [place] [facility];  Service: Cardiovascular;  Laterality: N/A;  ? ESOPHAGOGASTRODUODENOSCOPY N/A 03/21/2015  ? Procedure: ESOPHAGOGASTRODUODENOSCOPY (EGD);  Surgeon: [name] Ada, MD;  Location: [facility] ENDOSCOPY;  Service: Endoscopy;  Laterality: N/A;  ? LEFT HEART CATH AND CORONARY ANGIOGRAPHY N/A 05/15/2021  ? Procedure: LEFT HEART CATH AND CORONARY ANGIOGRAPHY;  Surgeon: [name] Booze, MD;  Location: [place] [facility];  Service: Cardiovascular;  Laterality: N/A;  ? LOOP RECORDER IMPLANT  07/27/14  ? LOOP REVEAL LINQ [id number]  ? loop recorder removed 2017    ? TEE WITHOUT CARDIOVERSION N/A 07/27/2014  ? Procedure: TRANSESOPHAGEAL ECHOCARDIOGRAM (TEE);  Surgeon: [name] Casino, MD;  Location: [place];  Service: Cardiovascular;  Laterality: N/A;  ? ? ? ?Current Outpatient Medications  ?Medication Sig Dispense  Refill  ? amiodarone (PACERONE) 200 MG tablet Take 0.5 tablets (100 mg total) by mouth daily. 45 tablet 3  ? furosemide (LASIX) 40 MG tablet Take 1 tablet (40 mg total) by mouth daily. 30 tablet 3  ? potassium chloride SA (KLOR-CON M) 20 MEQ tablet Take 1 tablet (20 mEq total) by mouth daily. 30 tablet 3  ? rivaroxaban (XARELTO) 20 MG TABS tablet Take 1 tablet (20 mg total) by mouth daily with  supper. 30 tablet 3  ? rosuvastatin (CRESTOR) 20 MG tablet Take 1 tablet (20 mg total) by mouth at bedtime. 30 tablet 3  ? spironolactone (ALDACTONE) 25 MG tablet Take 0.5 tablets (12.5 mg total) by mouth daily. 15 tablet 2  ? ?No current facility-administered medications for this visit.  ? ? ?Allergies:   Patient [name] no known allergies.  ? ?Social History:  The patient  reports that he [name] been smoking cigarettes. He [name] a 100.00 pack-year smoking history. He [name] never used smokeless tobacco. He reports that he does not currently use alcohol. He reports current drug use. Frequency: 14.00 times per week. Drug: Marijuana.  ? ?Family History:  The patient's family history includes Alcohol abuse in his brother; Cancer in his father; Heart attack in his mother; Heart attack (age of onset: 62) in his brother; Heart disease in his brother; Hypertension in his brother.  ? ? ?ROS:  Please see the history of present illness.   Otherwise, review of systems is positive for none.   All other systems are reviewed and negative.  ? ? ?PHYSICAL EXAM: ?VS:  BP (!) 98/58   Pulse 60   Ht 5\' 6"  (1.676 m)   Wt 156 lb 12.8 oz (71.1 kg)   SpO2 97%   BMI 25.31 kg/m?  , BMI Body mass index is 25.31 kg/m?. ?GEN: Well nourished, well developed, in no acute distress  ?HEENT: normal  ?Neck: no JVD, carotid bruits, or masses ?Cardiac: RRR; no murmurs, rubs, or gallops,no edema  ?Respiratory:  clear to auscultation bilaterally, normal work of breathing ?GI: soft, nontender, nondistended, + BS ?MS: no deformity or atrophy  ?Skin: warm and dry ?Neuro:  Strength and sensation are intact ?Psych: euthymic mood, full affect ? ?EKG:  EKG is not ordered today. ?Personal review of the ekg ordered 06/04/21 shows sinus rhythm, rate 58 ? ?Recent Labs: ?05/12/2021: ALT 10 ?05/17/2021: TSH 4.002 ?05/26/2021: B Natriuretic Peptide 530.2 ?05/28/2021: Hemoglobin 15.7; Magnesium 1.8; Platelets 412 ?06/11/2021: BUN 19; Creatinine, Ser 1.23; Potassium 4.4; Sodium 135   ? ? ?Lipid Panel  ?   ?Component Value Date/Time  ? CHOL 242 (H) 05/17/2021 0251  ? CHOL 137 12/15/2017 0817  ? TRIG 101 05/17/2021 0251  ? HDL 43 05/17/2021 0251  ? HDL 33 (L) 12/15/2017 0817  ? CHOLHDL 5.6 05/17/2021 0251  ? VLDL 20 05/17/2021 0251  ? LDLCALC 179 (H) 05/17/2021 0251  ? [place] 84 12/15/2017 0817  ? ? ? ?Wt Readings from Last 3 Encounters:  ?06/30/21 156 lb 12.8 oz (71.1 kg)  ?06/04/21 154 lb 12.8 oz (70.2 kg)  ?05/28/21 153 lb 14.1 oz (69.8 kg)  ?  ? ? ?Other studies Reviewed: ?Additional studies/ records that were reviewed today include: TTE 05/26/21  ?Review of the above records today demonstrates:  ? 1. Left ventricular ejection fraction, by estimation, is 55 to 60%. The  ?left ventricle [name] normal function. There is moderate hypertrophy of the  ?basal septal segment. The rest of the LV segments demonstrate mild  ?  concentric left ventricular hypertrophy.  ? 2. Right ventricular systolic function is normal. The right ventricular  ?size is normal.  ? 3. Left atrial size was moderately dilated.  ? 4. The mitral valve is abnormal.  ? 5. The aortic valve is tricuspid. There is mild calcification of the  ?aortic valve. There is moderate thickening of the aortic valve.  ? 6. The inferior vena cava is normal in size with greater than 50%  ?respiratory variability, suggesting right atrial pressure of 3 mmHg.  ? ? ?ASSESSMENT AND PLAN: ? ?1.  Paroxysmal atrial fibrillation/flutter: Currently on amiodarone 200 mg daily, Xarelto 20 mg daily.  CHA2DS2-VASc of 4.  At this point, with his history of cor triatriatum, we Paulina Muchmore hold off on ablation. He [name] a cardiac MRI pending.  Once his MRI [name] been done and read, we Jakaden Ouzts review his right atrium to see about the accessibility of his left atrium for possible ablation.  In the interim, ruled out reduce amiodarone to 100 mg daily.  If he goes back into atrial fibrillation, this Kandas Oliveto need to be increased back to 200 mg. ? ?2.  Chronic diastolic heart failure:  Ejection fraction 60%.  MRI pending.  Awaiting results of amyloid screening.  Plan per heart failure cardiology. ? ?3.  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  Awaiting MRI results. ? ?4.  Secondary hypercoagulable s

## 2021-07-01 ENCOUNTER — Other Ambulatory Visit (HOSPITAL_COMMUNITY): Payer: Self-pay

## 2021-07-01 ENCOUNTER — Ambulatory Visit (HOSPITAL_COMMUNITY)
Admission: RE | Admit: 2021-07-01 | Discharge: 2021-07-01 | Disposition: A | Discharge status: Home / Self Care | Payer: Medicare Other | Source: Ambulatory Visit | Attending: Cardiology | Admitting: Cardiology

## 2021-07-01 ENCOUNTER — Encounter (HOSPITAL_COMMUNITY): Payer: Self-pay | Admitting: Cardiology

## 2021-07-01 VITALS — BP 128/80 | HR 60 | Wt 155.8 lb

## 2021-07-01 DIAGNOSIS — F17200 Nicotine dependence, unspecified, uncomplicated: Secondary | ICD-10-CM | POA: Diagnosis not present

## 2021-07-01 DIAGNOSIS — Z8673 Personal history of transient ischemic attack (TIA), and cerebral infarction without residual deficits: Secondary | ICD-10-CM | POA: Diagnosis not present

## 2021-07-01 DIAGNOSIS — I480 Paroxysmal atrial fibrillation: Secondary | ICD-10-CM | POA: Diagnosis not present

## 2021-07-01 DIAGNOSIS — I110 Hypertensive heart disease with heart failure: Secondary | ICD-10-CM | POA: Diagnosis present

## 2021-07-01 DIAGNOSIS — I340 Nonrheumatic mitral (valve) insufficiency: Secondary | ICD-10-CM | POA: Diagnosis not present

## 2021-07-01 DIAGNOSIS — I701 Atherosclerosis of renal artery: Secondary | ICD-10-CM | POA: Diagnosis not present

## 2021-07-01 DIAGNOSIS — I4892 Unspecified atrial flutter: Secondary | ICD-10-CM | POA: Insufficient documentation

## 2021-07-01 DIAGNOSIS — E785 Hyperlipidemia, unspecified: Secondary | ICD-10-CM | POA: Diagnosis not present

## 2021-07-01 DIAGNOSIS — R0989 Other specified symptoms and signs involving the circulatory and respiratory systems: Secondary | ICD-10-CM | POA: Insufficient documentation

## 2021-07-01 DIAGNOSIS — Q242 Cor triatriatum: Secondary | ICD-10-CM | POA: Insufficient documentation

## 2021-07-01 DIAGNOSIS — Z79899 Other long term (current) drug therapy: Secondary | ICD-10-CM | POA: Diagnosis not present

## 2021-07-01 DIAGNOSIS — I5032 Chronic diastolic (congestive) heart failure: Secondary | ICD-10-CM | POA: Diagnosis not present

## 2021-07-01 LAB — COMPREHENSIVE METABOLIC PANEL
ALT: 18 U/L (ref 0–44)
AST: 20 U/L (ref 15–41)
Albumin: 3.9 g/dL (ref 3.5–5.0)
Alkaline Phosphatase: 89 U/L (ref 38–126)
Anion gap: 7 (ref 5–15)
BUN: 20 mg/dL (ref 8–23)
CO2: 28 mmol/L (ref 22–32)
Calcium: 9.9 mg/dL (ref 8.9–10.3)
Chloride: 101 mmol/L (ref 98–111)
Creatinine, Ser: 1.2 mg/dL (ref 0.61–1.24)
GFR, Estimated: >60 mL/min (ref >60)
Glucose, Bld: 82 mg/dL (ref 70–99)
Potassium: 4.4 mmol/L (ref 3.5–5.1)
Sodium: 136 mmol/L (ref 135–145)
Total Bilirubin: 0.8 mg/dL (ref 0.3–1.2)
Total Protein: 8.1 g/dL (ref 6.5–8.1)

## 2021-07-01 LAB — LIPID PANEL
Cholesterol: 163 mg/dL (ref 0–200)
HDL: 39 mg/dL — ABNORMAL LOW (ref >40)
LDL Cholesterol: 101 mg/dL — ABNORMAL HIGH (ref 0–99)
Total CHOL/HDL Ratio: 4.2 RATIO
Triglycerides: 114 mg/dL (ref <150)
VLDL: 23 mg/dL (ref 0–40)

## 2021-07-01 LAB — BRAIN NATRIURETIC PEPTIDE: B Natriuretic Peptide: 29.4 pg/mL (ref 0.0–100.0)

## 2021-07-01 LAB — TSH: TSH: 3.442 u[IU]/mL (ref 0.350–4.500)

## 2021-07-01 MED ORDER — FUROSEMIDE 40 MG PO TABS
ORAL_TABLET | ORAL | 3 refills | Status: DC
  Filled 2021-07-01 – 2021-07-15 (×2): qty 30, 20d supply, fill #0
  Filled 2021-08-25: qty 30, 45d supply, fill #1
  Filled 2021-10-07: qty 30, 45d supply, fill #2
  Filled 2021-11-23: qty 30, 45d supply, fill #3

## 2021-07-01 MED ORDER — ENTRESTO 24-26 MG PO TABS
1.0000 | ORAL_TABLET | Freq: Two times a day (BID) | ORAL | 11 refills | Status: DC
  Filled 2021-07-01 – 2021-07-31 (×3): qty 60, 30d supply, fill #0
  Filled 2021-09-17 – 2021-10-08 (×2): qty 60, 30d supply, fill #1
  Filled 2021-11-09: qty 60, 30d supply, fill #2
  Filled 2022-01-10: qty 60, 30d supply, fill #3
  Filled 2022-02-22: qty 60, 30d supply, fill #4
  Filled 2022-04-01: qty 60, 30d supply, fill #5
  Filled 2022-05-15 (×2): qty 60, 30d supply, fill #6
  Filled 2022-06-22: qty 60, 30d supply, fill #7

## 2021-07-01 NOTE — Progress Notes (Signed)
? ? ? ?Primary Care: Harrison Mons, PA  ?Primary Cardiologist: Sinclair Grooms, MD ?HF Caridology: Dr. Aundra Dubin ? ?66 y/o male w/ chronic diastolic heart failure, labile BP, tobacco use, HLD, CVA and atrial fibrillation was referred to CHF clinic from [facility] clinic.   ? ?Patient had cryptogenic stroke in 2016.  Placed on implantable loop recorder with finding of atrial fibrillation. Placed on Xarelto for anticoagulation. Echo then showed normal LVEF, 65-70%, normal RV. 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 ? ?He had 2 recent admissions in early 2023 for acute flash pulmonary edema/hypertensive urgency.  ? ?1st Admit 3/24-3/27/23: Presented w/ respiratory distress. Did not tolerate CPAP and placed on 15 L nonrebreather.  Intubated in the emergency room. Admitted for acute hypoxic respiratory failure in setting of flash pulmonary edema due to hypertensive urgency. He was also in Afib and felt to have possible PNA. Treated with broad-spectrum antibiotic.  Placed on heparin for elevated HS troponin [id number]>>731. Echo showed normal LVEF, 65%. TSH normal. UDS + for THC and Benzos.  Admission also notable for intermittent atrial flutter/fibrillation and development of a WCT that required emergent bedside defibrillation. Placed on IV amio and later transitioned to PO. LHC was done to r/o ischemic substrate. This showed mild, nonobstructive CAD. LVEDP was normal.  EP was also consulted for recommendations and recommend continuation of amio and ? blocker . His BP ultimately normalized and was actually running in low normal range with only little medications. Given this, renal artery stenosis was felt to be an unlikely culprit for pulmonary edema given normalization of pressures. He was discharged home on Metoprolol 50, amiodarone 200, Xarelto 20 and Crestor 20. Discharge home on 4/3.  ? ?Readmitted 05/25/21: presented w/ recurrent respiratory distress/ acute hypoxic respiratory failure in setting of flash  pulmonary edema and hypertensive urgency. BP was 165/126 and later 210/104. He was in atrial flutter. UDS + for THC only. Required BiPAP, IV Lasix, Nitropaste followed by IV nitroglycerin drip that was later tapered off. SCr had bumped from baseline of 1.03>>1.67. Diuretics later held. Repeat limited echo showed EF 55-60%, normal RV, mid-moderately thickened mitral and aortic valves but no MR nor AI. Similar to previous hospitalization, his BP normalized and returned to low normal range, limiting treatment w/ standing dose antihypertensive. His metoprolol was also later discontinued due to bradycardia. SCr returned to baseline. He was continued on PO amio and PO lasix restarted, 40 mg daily. Referred to [facility] clinic.  ? ?Cardiac MRI was done in 5/23 showing LV EF 58%, RV EF 56%, no myocardial LGE, no definite evidence for cor triatriatum dextrum but study may not have had the resolution to detect a thin wall in the RA, at least moderate MR.   ? ?Patient presents today for followup of CHF.  He is in NSR today.  No dyspnea walking on flat ground, but he does get short of breath walking up stairs and hills.  No chest pain.  No lightheadedness. BP today is not elevated, he says that BP tends to run 130s/80s at home.  He still smokes 1/4 ppd.  NSR today.  ? ?Labs (4/23): K 4.4, creatinine 1.23 ? ?PMH: ?1. CVA: 2016, related to atrial fibrillation. Left arm weakness.  ?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  ?- Cardiac MRI in 5/23 showed LV EF 58%, RV EF 56%, no myocardial LGE, at least moderate MR, no definite evidence for cor triatriatum in the RA but study may not have had the  resolution to detect a thin wall in the RA.   ?3.  Atrial fibrillation: Paroxysmal.  ?4.  HTN ?5.  Diastolic CHF: Echo (0000000) with EF 55-60%, normal RV, IVC normal.  ?- LHC (3/23): luminal irregularities.  ?- Cardiac MRI (5/23): LV EF 58%, RV EF 56%, no myocardial LGE, at least moderate MR, no definite evidence for cor triatriatum in  the RA but study may not have had the resolution to detect a thin wall in the RA.   ?6. Gout ? ?Review of Systems: All systems reviewed and negative except as per HPI. ? ?Current Outpatient Medications  ?Medication Sig Dispense Refill  ? amiodarone (PACERONE) 200 MG tablet Take 0.5 tablets (100 mg total) by mouth daily. 45 tablet 3  ? potassium chloride SA (KLOR-CON M) 20 MEQ tablet Take 1 tablet (20 mEq total) by mouth daily. 30 tablet 3  ? rivaroxaban (XARELTO) 20 MG TABS tablet Take 1 tablet (20 mg total) by mouth daily with supper. 30 tablet 3  ? rosuvastatin (CRESTOR) 20 MG tablet Take 1 tablet (20 mg total) by mouth at bedtime. 30 tablet 3  ? sacubitril-valsartan (ENTRESTO) 24-26 MG Take 1 tablet by mouth 2 (two) times daily. 60 tablet 11  ? spironolactone (ALDACTONE) 25 MG tablet Take 0.5 tablets (12.5 mg total) by mouth daily. 15 tablet 2  ? furosemide (LASIX) 40 MG tablet Alternate taking 1 tablet (40 mg total) by mouth daily AND 0.5 tablets (20 mg total) daily 30 tablet 3  ? ?No current facility-administered medications for this encounter.  ? ? ?No Known Allergies ? ?  ?Social History  ? ?Socioeconomic History  ? Marital status: Widowed  ?  Spouse name: Marcie Bal  ? Number of children: 3  ? Years of education: 57  ? Highest education level: 8th grade  ?Occupational History  ? Occupation: former Aeronautical engineer  ?  Comment: disabled due to CVA  ?Tobacco Use  ? Smoking status: Every Day  ?  Packs/day: 2.50  ?  Years: 40.00  ?  Pack years: 100.00  ?  Types: Cigarettes  ? Smokeless tobacco: Never  ? Tobacco comments:  ?  cut back after stroke, but increased again due to depression  ?Vaping Use  ? Vaping Use: Never used  ?Substance and Sexual Activity  ? Alcohol use: Not Currently  ? Drug use: Yes  ?  Frequency: 14.0 times per week  ?  Types: Marijuana  ?  Comment: trying to help his appetite  ? Sexual activity: Yes  ?  Partners: Female  ?Other Topics Concern  ? Not on file  ?Social History Narrative  ? Lives at home  with wife, Marcie Bal  ? Caffeine use - tea 4-5 glasses a day  ? ?Social Determinants of Health  ? ?Financial Resource Strain: High Risk  ? Difficulty of Paying Living Expenses: Hard  ?Food Insecurity: No Food Insecurity  ? Worried About Charity fundraiser in the Last Year: Never true  ? Ran Out of Food in the Last Year: Never true  ?Transportation Needs: No Transportation Needs  ? Lack of Transportation (Medical): No  ? Lack of Transportation (Non-Medical): No  ?Physical Activity: Not on file  ?Stress: Not on file  ?Social Connections: Not on file  ?Intimate Partner Violence: Not on file  ? ? ?  ?Family History  ?Problem Relation Age of Onset  ? Cancer Father   ?     leukemia  ? Heart attack Mother   ? Heart disease  Brother   ? Heart attack Brother 49  ?     CABG, stenting  ? Hypertension Brother   ? Alcohol abuse Brother   ? ? ?Vitals:  ? 07/01/21 1122  ?BP: 128/80  ?Pulse: 60  ?SpO2: 97%  ?Weight: 70.7 kg (155 lb 12.8 oz)  ? ? ?PHYSICAL EXAM: ?General: NAD ?Neck: No JVD, no thyromegaly or thyroid nodule.  ?Lungs: Clear to auscultation bilaterally with normal respiratory effort. ?CV: Nondisplaced PMI.  Heart regular S1/S2, no S3/S4, no murmur.  No peripheral edema.  No carotid bruit.  Normal pedal pulses.  ?Abdomen: Soft, nontender, no hepatosplenomegaly, no distention.  ?Skin: Intact without lesions or rashes.  ?Neurologic: Alert and oriented x 3. Left arm weakness.  ?Psych: Normal affect. ?Extremities: No clubbing or cyanosis.  ?HEENT: Normal.  ? ?ASSESSMENT & PLAN: ?1.  Chronic diastolic CHF:  Echo 0000000, EF ~60%, moderate hypertrophy of the basal septal segment. The rest of the LV segments demonstrate mild concentric left ventricular hypertrophy, RV normal, thickened mitral and aortic valves, mod LAE.  He has had admits for acute CHF w/ flash pulmonary edema/ hypertensive urgency. LHC (3/23) showed no significant CAD.  UDS negative for cocaine. BP has been labile and not persistently high.  Notably, episodes were  associated with atrial fibrillation.  Cardiac MRI in 5/23 was not suggestive of infiltrative disease (no LGE) but did suggest at least moderate MR (this was not noted on echoes).  Today, he is not volume o

## 2021-07-01 NOTE — Patient Instructions (Signed)
CHANGE Lasix to 40 mg daily alternating with 20 mg daily ?START Entresto 24/26 mg, one tab twice a day ? ?Labs today ?We will only contact you if something comes back abnormal or we need to make some changes. ?Otherwise no news is good news! ? ?Labs needed in 7-10 days ? ?You have been referred to [facility] Radiology (Renal Ultrasound) ?-they will be in contact with an appointment ? ?The CardioMEMS System consists of a small pressure-sensing device that is implanted directly into your pulmonary artery. Once implanted, the sensor measures and transmits your blood flow pressure and heart rate. ? ?Your physician recommends that you schedule a follow-up appointment in: 1 month with Dr Shirlee Latch ? ? ?Do the following things EVERYDAY: ?Weigh yourself in the morning before breakfast. Write it down and keep it in a log. ?Take your medicines as prescribed ?Eat low salt foods--Limit salt (sodium) to 2000 mg per day.  ?Stay as active as you can everyday ?Limit all fluids for the day to less than 2 liters ? ?At the [facility], you and your health needs are our priority. As part of our continuing mission to provide you with exceptional heart care, we have created designated Provider Care Teams. These Care Teams include your primary Cardiologist (physician) and Advanced Practice Providers (APPs- Physician Assistants and Nurse Practitioners) who all work together to provide you with the care you need, when you need it.  ? ?You may see any of the following providers on your designated Care Team at your next follow up: ?Dr Arvilla Meres ?Dr Marca Ancona ?Tonye Becket, NP ?Robbie Lis, PA ?Jessica Milford,NP ?Anna Genre, PA ?Karle Plumber, PharmD ? ? ?Please be sure to bring in all your medications bottles to every appointment.  ? ?If you have any questions or concerns before your next appointment please send [place] a message through [place] or call our office at [phone].   ? ?TO LEAVE A MESSAGE FOR THE NURSE  SELECT OPTION 2, PLEASE LEAVE A MESSAGE INCLUDING: ?YOUR NAME ?DATE OF BIRTH ?CALL BACK NUMBER ?REASON FOR CALL**this is important as we prioritize the call backs ? ?YOU WILL RECEIVE A CALL BACK THE SAME DAY AS LONG AS YOU CALL BEFORE 4:00 PM ? ? ? ?

## 2021-07-02 ENCOUNTER — Telehealth (HOSPITAL_COMMUNITY): Payer: Self-pay | Admitting: Surgery

## 2021-07-02 NOTE — Telephone Encounter (Signed)
I attempted to reach patient to review results and recommendations per provider.  I left a message to request that he call back. ?

## 2021-07-02 NOTE — Telephone Encounter (Signed)
-----   Message from Laurey Morale, MD sent at 07/01/2021  4:19 PM EDT ----- ?Increase Crestor to 40 to keep LDL < 70.  ?

## 2021-07-04 ENCOUNTER — Telehealth (HOSPITAL_COMMUNITY): Payer: Self-pay | Admitting: *Deleted

## 2021-07-04 MED ORDER — ROSUVASTATIN CALCIUM 40 MG PO TABS: 40.0000 mg | ORAL_TABLET | Freq: Every day | ORAL | 3 refills | Status: DC

## 2021-07-04 NOTE — Telephone Encounter (Signed)
Called patient per Dr. Shirlee Latch asking him to increase Crestor to 40 mg daily. New Rx sent.  Hessie Diener RN, VAD Coordinator [phone]

## 2021-07-07 ENCOUNTER — Other Ambulatory Visit (HOSPITAL_COMMUNITY): Payer: Self-pay

## 2021-07-11 ENCOUNTER — Other Ambulatory Visit (HOSPITAL_COMMUNITY): Payer: Medicare Other

## 2021-07-15 ENCOUNTER — Other Ambulatory Visit (HOSPITAL_COMMUNITY): Payer: Self-pay

## 2021-07-15 ENCOUNTER — Other Ambulatory Visit (HOSPITAL_COMMUNITY): Payer: Self-pay | Admitting: Cardiology

## 2021-07-22 ENCOUNTER — Encounter (HOSPITAL_COMMUNITY): Payer: Self-pay | Admitting: *Deleted

## 2021-07-22 NOTE — Telephone Encounter (Signed)
Per Dr Shirlee Latch:   Cardiac MRI showed at least moderate MR and did not show cor triatriatum well.  He needs TEE arranged with me to assess mitral valve and also for better view of possible cor triatriatum (need this to be defined to see if AF ablation can be done).    Have not been able to reach pt regarding results. Mychart message sent, pt is sch for f/u on 6/28

## 2021-07-29 ENCOUNTER — Other Ambulatory Visit (HOSPITAL_COMMUNITY): Payer: Self-pay | Admitting: Cardiology

## 2021-07-29 DIAGNOSIS — I701 Atherosclerosis of renal artery: Secondary | ICD-10-CM

## 2021-07-31 ENCOUNTER — Other Ambulatory Visit (HOSPITAL_COMMUNITY): Payer: Self-pay

## 2021-08-01 ENCOUNTER — Other Ambulatory Visit (HOSPITAL_COMMUNITY): Payer: Self-pay

## 2021-08-06 ENCOUNTER — Other Ambulatory Visit (HOSPITAL_COMMUNITY): Payer: Self-pay

## 2021-08-13 ENCOUNTER — Other Ambulatory Visit (HOSPITAL_COMMUNITY): Payer: Self-pay

## 2021-08-13 ENCOUNTER — Encounter (HOSPITAL_COMMUNITY): Payer: Self-pay | Admitting: Cardiology

## 2021-08-13 ENCOUNTER — Ambulatory Visit (HOSPITAL_COMMUNITY)
Admission: RE | Admit: 2021-08-13 | Discharge: 2021-08-13 | Disposition: A | Discharge status: Home / Self Care | Payer: Medicare Other | Source: Ambulatory Visit | Attending: Cardiology | Admitting: Cardiology

## 2021-08-13 VITALS — BP 130/80 | HR 62 | Wt 153.6 lb

## 2021-08-13 DIAGNOSIS — Q242 Cor triatriatum: Secondary | ICD-10-CM | POA: Diagnosis not present

## 2021-08-13 DIAGNOSIS — Z720 Tobacco use: Secondary | ICD-10-CM | POA: Diagnosis not present

## 2021-08-13 DIAGNOSIS — I5032 Chronic diastolic (congestive) heart failure: Secondary | ICD-10-CM

## 2021-08-13 DIAGNOSIS — I340 Nonrheumatic mitral (valve) insufficiency: Secondary | ICD-10-CM | POA: Diagnosis not present

## 2021-08-13 DIAGNOSIS — I4892 Unspecified atrial flutter: Secondary | ICD-10-CM | POA: Insufficient documentation

## 2021-08-13 DIAGNOSIS — E785 Hyperlipidemia, unspecified: Secondary | ICD-10-CM | POA: Insufficient documentation

## 2021-08-13 DIAGNOSIS — Z79899 Other long term (current) drug therapy: Secondary | ICD-10-CM | POA: Insufficient documentation

## 2021-08-13 DIAGNOSIS — I110 Hypertensive heart disease with heart failure: Secondary | ICD-10-CM | POA: Insufficient documentation

## 2021-08-13 DIAGNOSIS — Z8673 Personal history of transient ischemic attack (TIA), and cerebral infarction without residual deficits: Secondary | ICD-10-CM | POA: Diagnosis not present

## 2021-08-13 DIAGNOSIS — R0989 Other specified symptoms and signs involving the circulatory and respiratory systems: Secondary | ICD-10-CM | POA: Insufficient documentation

## 2021-08-13 LAB — BASIC METABOLIC PANEL
Anion gap: 10 (ref 5–15)
BUN: 12 mg/dL (ref 8–23)
CO2: 25 mmol/L (ref 22–32)
Calcium: 9.5 mg/dL (ref 8.9–10.3)
Chloride: 100 mmol/L (ref 98–111)
Creatinine, Ser: 1.22 mg/dL (ref 0.61–1.24)
GFR, Estimated: >60 mL/min (ref >60)
Glucose, Bld: 98 mg/dL (ref 70–99)
Potassium: 3.8 mmol/L (ref 3.5–5.1)
Sodium: 135 mmol/L (ref 135–145)

## 2021-08-13 LAB — BRAIN NATRIURETIC PEPTIDE: B Natriuretic Peptide: 16.2 pg/mL (ref 0.0–100.0)

## 2021-08-13 NOTE — H&P (View-Only) (Signed)
Primary Care: Porfirio Oar, PA  Primary Cardiologist: Lesleigh Noe, MD HF Caridology: Dr. Shirlee Latch  66 y/o male w/ chronic diastolic heart failure, labile BP, tobacco use, HLD, CVA and atrial fibrillation was referred to CHF clinic from [facility] clinic.    Patient had cryptogenic stroke in 2016.  Placed on implantable loop recorder with finding of atrial fibrillation. Placed on Xarelto for anticoagulation. Echo then showed normal LVEF, 65-70%, normal RV. 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  He had 2 recent admissions in early 2023 for acute flash pulmonary edema/hypertensive urgency.   1st Admit 3/24-3/27/23: Presented w/ respiratory distress. Did not tolerate CPAP and placed on 15 L nonrebreather.  Intubated in the emergency room. Admitted for acute hypoxic respiratory failure in setting of flash pulmonary edema due to hypertensive urgency. He was also in Afib and felt to have possible PNA. Treated with broad-spectrum antibiotic.  Placed on heparin for elevated HS troponin 17>>873>>861>>731. Echo showed normal LVEF, 65%. TSH normal. UDS + for THC and Benzos.  Admission also notable for intermittent atrial flutter/fibrillation and development of a WCT that required emergent bedside defibrillation. Placed on IV amio and later transitioned to PO. LHC was done to r/o ischemic substrate. This showed mild, nonobstructive CAD. LVEDP was normal.  EP was also consulted for recommendations and recommend continuation of amio and ? blocker . His BP ultimately normalized and was actually running in low normal range with only little medications. Given this, renal artery stenosis was felt to be an unlikely culprit for pulmonary edema given normalization of pressures. He was discharged home on Metoprolol 50, amiodarone 200, Xarelto 20 and Crestor 20. Discharge home on 4/3.   Readmitted 05/25/21: presented w/ recurrent respiratory distress/ acute hypoxic respiratory failure in setting of flash  pulmonary edema and hypertensive urgency. BP was 165/126 and later 210/104. He was in atrial flutter. UDS + for THC only. Required BiPAP, IV Lasix, Nitropaste followed by IV nitroglycerin drip that was later tapered off. SCr had bumped from baseline of 1.03>>1.67. Diuretics later held. Repeat limited echo showed EF 55-60%, normal RV, mid-moderately thickened mitral and aortic valves but no MR nor AI. Similar to previous hospitalization, his BP normalized and returned to low normal range, limiting treatment w/ standing dose antihypertensive. His metoprolol was also later discontinued due to bradycardia. SCr returned to baseline. He was continued on PO amio and PO lasix restarted, 40 mg daily. Referred to [facility] clinic.   Cardiac MRI was done in 5/23 showing LV EF 58%, RV EF 56%, no myocardial LGE, no definite evidence for cor triatriatum dextrum but study may not have had the resolution to detect a thin wall in the RA, at least moderate MR.    Patient presents today for followup of CHF.  Weight is down 3 lbs.  He is not short of breath walking short distances on flat ground, can walk for up to a mile but gets tired by the end of that time.  He is mildly short of breath walking up stairs.   He still smokes 1/4 ppd.  No chest pain.  No lightheadedness.    Labs (4/23): K 4.4, creatinine 1.23 Labs (5/23): K 4.4, creatinine 1.2, TSH normal, LDL 101, BNP 29, LFTs normal  PMH: 1. CVA: 2016, related to atrial fibrillation. Left arm weakness.  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  - Cardiac MRI in 5/23 showed LV EF 58%, RV EF 56%, no myocardial LGE, at least  moderate MR, no definite evidence for cor triatriatum in the RA but study may not have had the resolution to detect a thin wall in the RA.   3.  Atrial fibrillation: Paroxysmal.  4.  HTN 5.  Diastolic CHF: Echo (0000000) with EF 55-60%, normal RV, IVC normal.  - LHC (3/23): luminal irregularities.  - Cardiac MRI (5/23): LV EF 58%, RV EF 56%, no  myocardial LGE, at least moderate MR, no definite evidence for cor triatriatum in the RA but study may not have had the resolution to detect a thin wall in the RA.   6. Gout  Review of Systems: All systems reviewed and negative except as per HPI.  Current Outpatient Medications  Medication Sig Dispense Refill   amiodarone (PACERONE) 200 MG tablet Take 0.5 tablets (100 mg total) by mouth daily. 45 tablet 3   furosemide (LASIX) 40 MG tablet Alternate taking 1 tablet (40 mg total) by mouth daily AND 0.5 tablets (20 mg total) daily 30 tablet 3   potassium chloride SA (KLOR-CON M) 20 MEQ tablet Take 1 tablet (20 mEq total) by mouth daily. 30 tablet 3   rivaroxaban (XARELTO) 20 MG TABS tablet Take 1 tablet (20 mg total) by mouth daily with supper. 30 tablet 3   rosuvastatin (CRESTOR) 40 MG tablet Take 1 tablet (40 mg total) by mouth at bedtime. 90 tablet 3   sacubitril-valsartan (ENTRESTO) 24-26 MG Take 1 tablet by mouth 2 (two) times daily. 60 tablet 11   spironolactone (ALDACTONE) 25 MG tablet Take 0.5 tablets (12.5 mg total) by mouth daily. 15 tablet 2   No current facility-administered medications for this encounter.    No Known Allergies    Social History   Socioeconomic History   Marital status: Widowed    Spouse name: Marcie Bal   Number of children: 3   Years of education: 9   Highest education level: 8th grade  Occupational History   Occupation: former Aeronautical engineer    Comment: disabled due to CVA  Tobacco Use   Smoking status: Every Day    Packs/day: 2.50    Years: 40.00    Total pack years: 100.00    Types: Cigarettes   Smokeless tobacco: Never   Tobacco comments:    cut back after stroke, but increased again due to depression  Vaping Use   Vaping Use: Never used  Substance and Sexual Activity   Alcohol use: Not Currently   Drug use: Yes    Frequency: 14.0 times per week    Types: Marijuana    Comment: trying to help his appetite   Sexual activity: Yes    Partners:  Female  Other Topics Concern   Not on file  Social History Narrative   Lives at home with wife, Marcie Bal   Caffeine use - tea 4-5 glasses a day   Social Determinants of Health   Financial Resource Strain: High Risk (05/27/2021)   Overall Financial Resource Strain (CARDIA)    Difficulty of Paying Living Expenses: Hard  Food Insecurity: No Food Insecurity (05/27/2021)   Hunger Vital Sign    Worried About Running Out of Food in the Last Year: Never true    Ran Out of Food in the Last Year: Never true  Transportation Needs: No Transportation Needs (05/27/2021)   PRAPARE - Hydrologist (Medical): No    Lack of Transportation (Non-Medical): No  Physical Activity: Inactive (05/15/2017)   Exercise Vital Sign    Days of  Exercise per Week: 0 days    Minutes of Exercise per Session: 0 min  Stress: Not on file  Social Connections: Not on file  Intimate Partner Violence: Not on file      Family History  Problem Relation Age of Onset   Cancer Father        leukemia   Heart attack Mother    Heart disease Brother    Heart attack Brother 34       CABG, stenting   Hypertension Brother    Alcohol abuse Brother     Vitals:   08/13/21 1034  BP: 130/80  Pulse: 62  SpO2: 98%  Weight: 69.7 kg (153 lb 9.6 oz)    PHYSICAL EXAM: General: NAD Neck: No JVD, no thyromegaly or thyroid nodule.  Lungs: Clear to auscultation bilaterally with normal respiratory effort. CV: Nondisplaced PMI.  Heart regular S1/S2, no S3/S4, 1/6 HSM apex.  No peripheral edema.  No carotid bruit.  Normal pedal pulses.  Abdomen: Soft, nontender, no hepatosplenomegaly, no distention.  Skin: Intact without lesions or rashes.  Neurologic: Alert and oriented x 3. Left-sided weakness.  Psych: Normal affect. Extremities: No clubbing or cyanosis.  HEENT: Normal.   ASSESSMENT & PLAN: 1.  Chronic diastolic CHF:  Echo 4/23, EF ~60%, moderate hypertrophy of the basal septal segment. The rest of the  LV segments demonstrate mild concentric left ventricular hypertrophy, RV normal, thickened mitral and aortic valves, mod LAE.  He has had admits for acute CHF w/ flash pulmonary edema/ hypertensive urgency. LHC (3/23) showed no significant CAD.  UDS negative for cocaine. BP has been labile and not persistently high.  Notably, episodes were associated with atrial fibrillation.  Cardiac MRI in 5/23 was not suggestive of infiltrative disease (no LGE) or hypertrophic cardiomyopathy but did suggest at least moderate MR (this was not noted on echoes).  Today, he is not volume overloaded on exam.  NYHA class II symptoms.    - Need to maintain NSR (see below).  - Continue Entresto 24/26 bid, BMET/BNP today.   - Continue Lasix to 40 qam/20 qpm.   - Continue spironolactone 12.5 daily.  - Eventual SGLT2 inhibitor.  - Working on Clinical biochemist for Cardiomems placement.  - TEE evaluation for MR, see below (still needs to be done).  2. Atrial fibrillation/flutter: NSR by exam today.  AF/AFL episodes associated with decompensated CHF.  Would like to maintain in NSR.  - Continue amiodarone 100 mg daily.  Recent LFTs and TSH normal.  Will need regular eye exam.  - Cor triatriatum being evaluated to see if the left atrium would be accessible via the right atrium for AF ablation (Dr. Elberta Fortis following). Will assess by TEE (see below).  3. HTN: Labile BP.  Controlled today.  - Continue spironolactone and Entresto.   - Renal artery dopplers ordered but still not done.  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  I reviewed the 5/23 cMRI, cor triatriatum was not visualized.  However, MRI may not have the resolution to see a very thin wall in the RA.  - I think that he will need TEE to assess cor triatriatum as well as mitral regurgitation. Need to determine whether the LA can be accessed via RA for AF ablation.  5. Mitral regurgitation: Not noted by echo but difficult studies.  The cMRI visually showed at  least moderate MR though flow sequences to quantify were not done.  - I will arrange for TEE to  assess. This still needs to be set up.  I discussed risks/benefits with patient and he agrees to procedure.    Followup 3 months with APP.   Loralie Champagne 08/13/2021

## 2021-08-13 NOTE — Patient Instructions (Signed)
There has been no changes to your medications.  Labs done today, your results will be available in MyChart, we will contact you for abnormal readings.   You are scheduled for a TEE on Friday June 30th with Dr. Shirlee Latch.  Please arrive at the [facility] (Main Entrance A) at [facility]: [address] [place], [place] [address] at 9 am. (1 hour prior to procedure )  DIET: Nothing to eat or drink after midnight except a sip of water with medications (see medication instructions below)  FYI: For your safety, and to allow [place] to monitor your vital signs accurately during the surgery/procedure we request that   if you have artificial nails, gel coating, SNS etc. Please have those removed prior to your surgery/procedure. Not having the nail coverings /polish removed may result in cancellation or delay of your surgery/procedure.   Medication Instructions: Hold Lasix (Furosemide ) and Spironolactone the morning of the procedure  Continue your anticoagulant: [name]  You will need to continue your anticoagulant after your procedure until you  are told by your  Provider that it is safe to stop  You must have a responsible person to drive you home and stay in the waiting area during your procedure. Failure to do so could result in cancellation.  Bring your insurance cards.  *Special Note: Every effort is made to have your procedure done on time. Occasionally there are emergencies that occur at the hospital that may cause delays. Please be patient if a delay does occur.    Your physician recommends that you schedule a follow-up appointment in: 3 months.  If you have any questions or concerns before your next appointment please send [place] a message through [place] or call our office at [phone].    TO LEAVE A MESSAGE FOR THE NURSE SELECT OPTION 2, PLEASE LEAVE A MESSAGE INCLUDING: YOUR NAME DATE OF BIRTH CALL BACK NUMBER REASON FOR CALL**this is important as we prioritize the call  backs  YOU WILL RECEIVE A CALL BACK THE SAME DAY AS LONG AS YOU CALL BEFORE 4:00 PM  At the [facility], you and your health needs are our priority. As part of our continuing mission to provide you with exceptional heart care, we have created designated Provider Care Teams. These Care Teams include your primary Cardiologist (physician) and Advanced Practice Providers (APPs- Physician Assistants and Nurse Practitioners) who all work together to provide you with the care you need, when you need it.   You may see any of the following providers on your designated Care Team at your next follow up: Dr Arvilla Meres Dr Carron Curie, NP Robbie Lis, [place] [facility] [place], [place] Karle Plumber, PharmD   Please be sure to bring in all your medications bottles to every appointment.

## 2021-08-13 NOTE — Progress Notes (Signed)
Primary Care: Porfirio Oar, PA  Primary Cardiologist: Lesleigh Noe, MD HF Caridology: Dr. Shirlee Latch  66 y/o male w/ chronic diastolic heart failure, labile BP, tobacco use, HLD, CVA and atrial fibrillation was referred to CHF clinic from [facility] clinic.    Patient had cryptogenic stroke in 2016.  Placed on implantable loop recorder with finding of atrial fibrillation. Placed on Xarelto for anticoagulation. Echo then showed normal LVEF, 65-70%, normal RV. 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  He had 2 recent admissions in early 2023 for acute flash pulmonary edema/hypertensive urgency.   1st Admit 3/24-3/27/23: Presented w/ respiratory distress. Did not tolerate CPAP and placed on 15 L nonrebreather.  Intubated in the emergency room. Admitted for acute hypoxic respiratory failure in setting of flash pulmonary edema due to hypertensive urgency. He was also in Afib and felt to have possible PNA. Treated with broad-spectrum antibiotic.  Placed on heparin for elevated HS troponin 17>>873>>861>>731. Echo showed normal LVEF, 65%. TSH normal. UDS + for THC and Benzos.  Admission also notable for intermittent atrial flutter/fibrillation and development of a WCT that required emergent bedside defibrillation. Placed on IV amio and later transitioned to PO. LHC was done to r/o ischemic substrate. This showed mild, nonobstructive CAD. LVEDP was normal.  EP was also consulted for recommendations and recommend continuation of amio and ? blocker . His BP ultimately normalized and was actually running in low normal range with only little medications. Given this, renal artery stenosis was felt to be an unlikely culprit for pulmonary edema given normalization of pressures. He was discharged home on Metoprolol 50, amiodarone 200, Xarelto 20 and Crestor 20. Discharge home on 4/3.   Readmitted 05/25/21: presented w/ recurrent respiratory distress/ acute hypoxic respiratory failure in setting of flash  pulmonary edema and hypertensive urgency. BP was 165/126 and later 210/104. He was in atrial flutter. UDS + for THC only. Required BiPAP, IV Lasix, Nitropaste followed by IV nitroglycerin drip that was later tapered off. SCr had bumped from baseline of 1.03>>1.67. Diuretics later held. Repeat limited echo showed EF 55-60%, normal RV, mid-moderately thickened mitral and aortic valves but no MR nor AI. Similar to previous hospitalization, his BP normalized and returned to low normal range, limiting treatment w/ standing dose antihypertensive. His metoprolol was also later discontinued due to bradycardia. SCr returned to baseline. He was continued on PO amio and PO lasix restarted, 40 mg daily. Referred to [facility] clinic.   Cardiac MRI was done in 5/23 showing LV EF 58%, RV EF 56%, no myocardial LGE, no definite evidence for cor triatriatum dextrum but study may not have had the resolution to detect a thin wall in the RA, at least moderate MR.    Patient presents today for followup of CHF.  Weight is down 3 lbs.  He is not short of breath walking short distances on flat ground, can walk for up to a mile but gets tired by the end of that time.  He is mildly short of breath walking up stairs.   He still smokes 1/4 ppd.  No chest pain.  No lightheadedness.    Labs (4/23): K 4.4, creatinine 1.23 Labs (5/23): K 4.4, creatinine 1.2, TSH normal, LDL 101, BNP 29, LFTs normal  PMH: 1. CVA: 2016, related to atrial fibrillation. Left arm weakness.  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  - Cardiac MRI in 5/23 showed LV EF 58%, RV EF 56%, no myocardial LGE, at least  moderate MR, no definite evidence for cor triatriatum in the RA but study may not have had the resolution to detect a thin wall in the RA.   3.  Atrial fibrillation: Paroxysmal.  4.  HTN 5.  Diastolic CHF: Echo (0000000) with EF 55-60%, normal RV, IVC normal.  - LHC (3/23): luminal irregularities.  - Cardiac MRI (5/23): LV EF 58%, RV EF 56%, no  myocardial LGE, at least moderate MR, no definite evidence for cor triatriatum in the RA but study may not have had the resolution to detect a thin wall in the RA.   6. Gout  Review of Systems: All systems reviewed and negative except as per HPI.  Current Outpatient Medications  Medication Sig Dispense Refill   amiodarone (PACERONE) 200 MG tablet Take 0.5 tablets (100 mg total) by mouth daily. 45 tablet 3   furosemide (LASIX) 40 MG tablet Alternate taking 1 tablet (40 mg total) by mouth daily AND 0.5 tablets (20 mg total) daily 30 tablet 3   potassium chloride SA (KLOR-CON M) 20 MEQ tablet Take 1 tablet (20 mEq total) by mouth daily. 30 tablet 3   rivaroxaban (XARELTO) 20 MG TABS tablet Take 1 tablet (20 mg total) by mouth daily with supper. 30 tablet 3   rosuvastatin (CRESTOR) 40 MG tablet Take 1 tablet (40 mg total) by mouth at bedtime. 90 tablet 3   sacubitril-valsartan (ENTRESTO) 24-26 MG Take 1 tablet by mouth 2 (two) times daily. 60 tablet 11   spironolactone (ALDACTONE) 25 MG tablet Take 0.5 tablets (12.5 mg total) by mouth daily. 15 tablet 2   No current facility-administered medications for this encounter.    No Known Allergies    Social History   Socioeconomic History   Marital status: Widowed    Spouse name: Marcie Bal   Number of children: 3   Years of education: 9   Highest education level: 8th grade  Occupational History   Occupation: former Aeronautical engineer    Comment: disabled due to CVA  Tobacco Use   Smoking status: Every Day    Packs/day: 2.50    Years: 40.00    Total pack years: 100.00    Types: Cigarettes   Smokeless tobacco: Never   Tobacco comments:    cut back after stroke, but increased again due to depression  Vaping Use   Vaping Use: Never used  Substance and Sexual Activity   Alcohol use: Not Currently   Drug use: Yes    Frequency: 14.0 times per week    Types: Marijuana    Comment: trying to help his appetite   Sexual activity: Yes    Partners:  Female  Other Topics Concern   Not on file  Social History Narrative   Lives at home with wife, Marcie Bal   Caffeine use - tea 4-5 glasses a day   Social Determinants of Health   Financial Resource Strain: High Risk (05/27/2021)   Overall Financial Resource Strain (CARDIA)    Difficulty of Paying Living Expenses: Hard  Food Insecurity: No Food Insecurity (05/27/2021)   Hunger Vital Sign    Worried About Running Out of Food in the Last Year: Never true    Ran Out of Food in the Last Year: Never true  Transportation Needs: No Transportation Needs (05/27/2021)   PRAPARE - Hydrologist (Medical): No    Lack of Transportation (Non-Medical): No  Physical Activity: Inactive (05/15/2017)   Exercise Vital Sign    Days of  Exercise per Week: 0 days    Minutes of Exercise per Session: 0 min  Stress: Not on file  Social Connections: Not on file  Intimate Partner Violence: Not on file      Family History  Problem Relation Age of Onset   Cancer Father        leukemia   Heart attack Mother    Heart disease Brother    Heart attack Brother 34       CABG, stenting   Hypertension Brother    Alcohol abuse Brother     Vitals:   08/13/21 1034  BP: 130/80  Pulse: 62  SpO2: 98%  Weight: 69.7 kg (153 lb 9.6 oz)    PHYSICAL EXAM: General: NAD Neck: No JVD, no thyromegaly or thyroid nodule.  Lungs: Clear to auscultation bilaterally with normal respiratory effort. CV: Nondisplaced PMI.  Heart regular S1/S2, no S3/S4, 1/6 HSM apex.  No peripheral edema.  No carotid bruit.  Normal pedal pulses.  Abdomen: Soft, nontender, no hepatosplenomegaly, no distention.  Skin: Intact without lesions or rashes.  Neurologic: Alert and oriented x 3. Left-sided weakness.  Psych: Normal affect. Extremities: No clubbing or cyanosis.  HEENT: Normal.   ASSESSMENT & PLAN: 1.  Chronic diastolic CHF:  Echo 4/23, EF ~60%, moderate hypertrophy of the basal septal segment. The rest of the  LV segments demonstrate mild concentric left ventricular hypertrophy, RV normal, thickened mitral and aortic valves, mod LAE.  He has had admits for acute CHF w/ flash pulmonary edema/ hypertensive urgency. LHC (3/23) showed no significant CAD.  UDS negative for cocaine. BP has been labile and not persistently high.  Notably, episodes were associated with atrial fibrillation.  Cardiac MRI in 5/23 was not suggestive of infiltrative disease (no LGE) or hypertrophic cardiomyopathy but did suggest at least moderate MR (this was not noted on echoes).  Today, he is not volume overloaded on exam.  NYHA class II symptoms.    - Need to maintain NSR (see below).  - Continue Entresto 24/26 bid, BMET/BNP today.   - Continue Lasix to 40 qam/20 qpm.   - Continue spironolactone 12.5 daily.  - Eventual SGLT2 inhibitor.  - Working on Clinical biochemist for Cardiomems placement.  - TEE evaluation for MR, see below (still needs to be done).  2. Atrial fibrillation/flutter: NSR by exam today.  AF/AFL episodes associated with decompensated CHF.  Would like to maintain in NSR.  - Continue amiodarone 100 mg daily.  Recent LFTs and TSH normal.  Will need regular eye exam.  - Cor triatriatum being evaluated to see if the left atrium would be accessible via the right atrium for AF ablation (Dr. Elberta Fortis following). Will assess by TEE (see below).  3. HTN: Labile BP.  Controlled today.  - Continue spironolactone and Entresto.   - Renal artery dopplers ordered but still not done.  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  I reviewed the 5/23 cMRI, cor triatriatum was not visualized.  However, MRI may not have the resolution to see a very thin wall in the RA.  - I think that he will need TEE to assess cor triatriatum as well as mitral regurgitation. Need to determine whether the LA can be accessed via RA for AF ablation.  5. Mitral regurgitation: Not noted by echo but difficult studies.  The cMRI visually showed at  least moderate MR though flow sequences to quantify were not done.  - I will arrange for TEE to  assess. This still needs to be set up.  I discussed risks/benefits with patient and he agrees to procedure.    Followup 3 months with APP.   Loralie Champagne 08/13/2021

## 2021-08-15 ENCOUNTER — Ambulatory Visit (HOSPITAL_BASED_OUTPATIENT_CLINIC_OR_DEPARTMENT_OTHER)
Admission: RE | Admit: 2021-08-15 | Discharge: 2021-08-15 | Disposition: A | Discharge status: Home / Self Care | Payer: Medicare Other | Source: Ambulatory Visit | Attending: Cardiology | Admitting: Cardiology

## 2021-08-15 ENCOUNTER — Encounter (HOSPITAL_COMMUNITY): Payer: Self-pay | Admitting: Cardiology

## 2021-08-15 ENCOUNTER — Encounter (HOSPITAL_COMMUNITY)
Admission: RE | Disposition: A | Discharge status: Home / Self Care | Payer: Self-pay | Source: Home / Self Care | Attending: Cardiology

## 2021-08-15 ENCOUNTER — Other Ambulatory Visit: Payer: Self-pay

## 2021-08-15 ENCOUNTER — Ambulatory Visit (HOSPITAL_COMMUNITY): Payer: Medicare Other | Admitting: Anesthesiology

## 2021-08-15 ENCOUNTER — Ambulatory Visit (HOSPITAL_BASED_OUTPATIENT_CLINIC_OR_DEPARTMENT_OTHER): Payer: Medicare Other | Admitting: Anesthesiology

## 2021-08-15 ENCOUNTER — Ambulatory Visit (HOSPITAL_COMMUNITY)
Admission: RE | Admit: 2021-08-15 | Discharge: 2021-08-15 | Disposition: A | Discharge status: Home / Self Care | Payer: Medicare Other | Attending: Cardiology | Admitting: Cardiology

## 2021-08-15 DIAGNOSIS — I110 Hypertensive heart disease with heart failure: Secondary | ICD-10-CM | POA: Diagnosis not present

## 2021-08-15 DIAGNOSIS — I340 Nonrheumatic mitral (valve) insufficiency: Secondary | ICD-10-CM

## 2021-08-15 DIAGNOSIS — I341 Nonrheumatic mitral (valve) prolapse: Secondary | ICD-10-CM | POA: Insufficient documentation

## 2021-08-15 DIAGNOSIS — Z79899 Other long term (current) drug therapy: Secondary | ICD-10-CM | POA: Diagnosis not present

## 2021-08-15 DIAGNOSIS — I5032 Chronic diastolic (congestive) heart failure: Secondary | ICD-10-CM | POA: Insufficient documentation

## 2021-08-15 DIAGNOSIS — Q242 Cor triatriatum: Secondary | ICD-10-CM | POA: Diagnosis not present

## 2021-08-15 DIAGNOSIS — F17210 Nicotine dependence, cigarettes, uncomplicated: Secondary | ICD-10-CM | POA: Diagnosis not present

## 2021-08-15 DIAGNOSIS — I4891 Unspecified atrial fibrillation: Secondary | ICD-10-CM

## 2021-08-15 DIAGNOSIS — I10 Essential (primary) hypertension: Secondary | ICD-10-CM | POA: Diagnosis not present

## 2021-08-15 DIAGNOSIS — Z8673 Personal history of transient ischemic attack (TIA), and cerebral infarction without residual deficits: Secondary | ICD-10-CM | POA: Insufficient documentation

## 2021-08-15 HISTORY — PX: TEE WITHOUT CARDIOVERSION: SHX5443

## 2021-08-15 HISTORY — PX: BUBBLE STUDY: SHX6837

## 2021-08-15 SURGERY — ECHOCARDIOGRAM, TRANSESOPHAGEAL
Anesthesia: Monitor Anesthesia Care

## 2021-08-15 MED ORDER — SODIUM CHLORIDE 0.9 % IV SOLN: INTRAVENOUS | Status: DC

## 2021-08-15 MED ORDER — PROPOFOL 10 MG/ML IV BOLUS
INTRAVENOUS | Status: DC | PRN
  Administered 2021-08-15: 50 mg via INTRAVENOUS

## 2021-08-15 MED ORDER — PROPOFOL 500 MG/50ML IV EMUL
INTRAVENOUS | Status: DC | PRN
  Administered 2021-08-15: 200 ug/kg/min via INTRAVENOUS

## 2021-08-15 MED ORDER — BUTAMBEN-TETRACAINE-BENZOCAINE 2-2-14 % EX AERO
INHALATION_SPRAY | CUTANEOUS | Status: DC | PRN
  Administered 2021-08-15: 2 via TOPICAL

## 2021-08-15 MED ORDER — EPHEDRINE SULFATE-NACL 50-0.9 MG/10ML-% IV SOSY
PREFILLED_SYRINGE | INTRAVENOUS | Status: DC | PRN
  Administered 2021-08-15 (×3): 5 mg via INTRAVENOUS

## 2021-08-15 MED ORDER — LACTATED RINGERS IV SOLN: INTRAVENOUS | Status: DC | PRN

## 2021-08-15 NOTE — Interval H&P Note (Signed)
History and Physical Interval Note:  08/15/2021 10:04 AM  [name]  has presented today for surgery, with the diagnosis of mitral regurgitation.  The various methods of treatment have been discussed with the patient and family. After consideration of risks, benefits and other options for treatment, the patient has consented to  Procedure(s): TRANSESOPHAGEAL ECHOCARDIOGRAM (TEE) (N/A) as a surgical intervention.  The patient's history has been reviewed, patient examined, no change in status, stable for surgery.  I have reviewed the patient's chart and labs.  Questions were answered to the patient's satisfaction.     Ly Wass [employer]

## 2021-08-15 NOTE — Transfer of Care (Signed)
Immediate Anesthesia Transfer of Care Note  Patient: [name]  Procedure(s) Performed: TRANSESOPHAGEAL ECHOCARDIOGRAM (TEE) BUBBLE STUDY  Patient Location: Short Stay  Anesthesia Type:MAC  Level of Consciousness: drowsy  Airway & Oxygen Therapy: Patient Spontanous Breathing and Patient connected to nasal cannula oxygen  Post-op Assessment: Report given to RN and Post -op Vital signs reviewed and stable  Post vital signs: Reviewed and stable  Last Vitals:  Vitals Value Taken Time  BP 96/57 08/15/21 1058  Temp    Pulse 53 08/15/21 1058  Resp 17 08/15/21 1058  SpO2 98 % 08/15/21 1058  Vitals shown include unvalidated device data.  Last Pain:  Vitals:   08/15/21 0924  TempSrc: Temporal  PainSc: 0-No pain         Complications: No notable events documented.

## 2021-08-15 NOTE — Progress Notes (Signed)
  Echocardiogram Echocardiogram Transesophageal has been performed.  [name] 08/15/2021, 11:31 AM

## 2021-08-15 NOTE — Discharge Instructions (Signed)

## 2021-08-15 NOTE — Anesthesia Procedure Notes (Signed)
Procedure Name: General with mask airway Date/Time: 08/15/2021 10:30 AM  Performed by: Katina Degree, CRNAPre-anesthesia Checklist: Patient identified, Emergency Drugs available, Suction available and Patient being monitored Patient Re-evaluated:Patient Re-evaluated prior to induction Oxygen Delivery Method: Nasal cannula Preoxygenation: Pre-oxygenation with 100% oxygen Induction Type: IV induction Airway Equipment and Method: Bite block Dental Injury: Teeth and Oropharynx as per pre-operative assessment

## 2021-08-15 NOTE — Anesthesia Preprocedure Evaluation (Addendum)
Anesthesia Evaluation  Patient identified by MRN, date of birth, ID band Patient awake    Reviewed: Allergy & Precautions, NPO status , Patient's Chart, lab work & pertinent test results  History of Anesthesia Complications Negative for: history of anesthetic complications  Airway Mallampati: II  TM Distance: >3 FB Neck ROM: Full    Dental  (+) Missing,    Pulmonary Current Smoker,    Pulmonary exam normal        Cardiovascular hypertension, Pt. on medications Normal cardiovascular exam+ dysrhythmias Atrial Fibrillation   TTE 05/26/21: EF 55-60%, moderate hypertrophy of basal septal segment, moderate LAE   Neuro/Psych Anxiety Depression CVA (2016,ischemic right MCA stroke with persistent left-sided neglect and left spastic hemiparesis)    GI/Hepatic negative GI ROS, Neg liver ROS,   Endo/Other  negative endocrine ROS  Renal/GU negative Renal ROS  negative genitourinary   Musculoskeletal  (+) Arthritis ,   Abdominal   Peds  Hematology negative hematology ROS (+)   Anesthesia Other Findings Day of surgery medications reviewed with patient.  Reproductive/Obstetrics negative OB ROS                           Anesthesia Physical Anesthesia Plan  ASA: 3  Anesthesia Plan: MAC   Post-op Pain Management: Minimal or no pain anticipated   Induction:   PONV Risk Score and Plan: 0 and Treatment may vary due to age or medical condition and Propofol infusion  Airway Management Planned: Natural Airway and Nasal Cannula  Additional Equipment: None  Intra-op Plan:   Post-operative Plan:   Informed Consent: I have reviewed the patients History and Physical, chart, labs and discussed the procedure including the risks, benefits and alternatives for the proposed anesthesia with the patient or authorized representative who has indicated his/her understanding and acceptance.       Plan Discussed  with: CRNA  Anesthesia Plan Comments:        Anesthesia Quick Evaluation

## 2021-08-15 NOTE — CV Procedure (Signed)
Procedure: TEE  Sedation: Per anesthesiology  Indication: Mitral regurgitation, ?cor triatriatum  Findings:  Please see echo section for full report.  Normal LV size with EF 55-60%.  Normal wall motion.  Normal RV size and systolic function.  Mild left atrial enlargement, no LA appendage thrombus.  Normal right atrial size.  There was a prominent eustachian valve versus incomplete cor triatriatum noted.  There was not significant obstruction to flow present.  There was a very small PFO noted by bubble study (few bubbles appeared to cross) but not seen on color doppler.  No significant tricuspid regurgitation.  Trileaflet, mildly calcified aortic valve with no stenosis or regurgitation.  There was mild bileaflet mitral valve prolapse with moderate MR, PISA ERO 0.24 cm^2.  No systolic flow reversal in the pulmonary vein doppler pattern.  No mitral stenosis.  Normal caliber thoracic aorta with mild plaque.   Impression: Moderate MR with mitral valve prolapse.  Prominent eustachian valve versus incomplete right-sided cor triatriatum without significant obstruction.   [name] 08/15/2021 11:00 AM

## 2021-08-15 NOTE — Anesthesia Postprocedure Evaluation (Signed)
Anesthesia Post Note  Patient: [name]  Procedure(s) Performed: TRANSESOPHAGEAL ECHOCARDIOGRAM (TEE) BUBBLE STUDY     Patient location during evaluation: PACU Anesthesia Type: MAC Level of consciousness: awake and alert Pain management: pain level controlled Vital Signs Assessment: post-procedure vital signs reviewed and stable Respiratory status: spontaneous breathing, nonlabored ventilation and respiratory function stable Cardiovascular status: blood pressure returned to baseline Postop Assessment: no apparent nausea or vomiting Anesthetic complications: no   No notable events documented.  Last Vitals:  Vitals:   08/15/21 1110 08/15/21 1119  BP: 104/69 97/60  Pulse: 91 87  Resp: 20 (!) 24  Temp:    SpO2: 96% 96%    Last Pain:  Vitals:   08/15/21 1119  TempSrc:   PainSc: 0-No pain                 Marthenia Rolling

## 2021-08-25 ENCOUNTER — Other Ambulatory Visit (HOSPITAL_COMMUNITY): Payer: Self-pay

## 2021-08-27 LAB — ECHO TEE
MV M vel: 5.04 m/s
MV Peak grad: 101.6 mmHg
Radius: 0.7 cm

## 2021-08-29 ENCOUNTER — Telehealth (HOSPITAL_COMMUNITY): Payer: Self-pay | Admitting: Cardiology

## 2021-08-29 NOTE — Telephone Encounter (Signed)
Cardio mems approved Auth # [id number] Valid 08/23/21-11/21/21  Attempted to contact patient to review availability for procedure No answer unable to leave message, vm full. Will attempt at later date.

## 2021-09-01 ENCOUNTER — Telehealth: Payer: Self-pay | Admitting: *Deleted

## 2021-09-01 NOTE — Telephone Encounter (Signed)
-----   Message from Will Jorja Loa, MD sent at 08/15/2021  1:13 PM EDT ----- Patient can be scheduled for AF ablation. ----- Message ----- From: Laurey Morale, MD Sent: 08/15/2021  11:00 AM EDT To: Regan Lemming, MD

## 2021-09-01 NOTE — Telephone Encounter (Signed)
Unable to leave message to call back, no voicemail 

## 2021-09-03 NOTE — Telephone Encounter (Signed)
Attempted to contact patient to review availability for procedure  No answer unable to leave message, vm full. Will attempt at later date.

## 2021-09-17 ENCOUNTER — Other Ambulatory Visit (HOSPITAL_COMMUNITY): Payer: Self-pay

## 2021-09-18 NOTE — Telephone Encounter (Signed)
unable to leave a message, voicemail full

## 2021-09-24 ENCOUNTER — Telehealth (HOSPITAL_COMMUNITY): Payer: Self-pay | Admitting: Cardiology

## 2021-09-24 NOTE — Telephone Encounter (Signed)
-----   Message from Laurey Morale, MD sent at 07/01/2021 11:33 PM EDT ----- Cardiac MRI showed at least moderate MR and did not show cor triatriatum well.  He needs TEE arranged with me to assess mitral valve and also for better view of possible cor triatriatum (need this to be defined to see if AF ablation can be done).

## 2021-09-24 NOTE — Telephone Encounter (Signed)
Left message with male- will have patient return call once she gets home.  -note patient is approved for cardiomems ok to schedule with TEE?

## 2021-09-25 NOTE — Telephone Encounter (Signed)
No answer-- unable to leave message.

## 2021-10-06 ENCOUNTER — Encounter: Payer: Self-pay | Admitting: *Deleted

## 2021-10-06 NOTE — Telephone Encounter (Signed)
Mailing letter to home address on file asking pt to call office to discuss scheduling procedure and gated CTA.

## 2021-10-07 ENCOUNTER — Other Ambulatory Visit (HOSPITAL_COMMUNITY): Payer: Self-pay

## 2021-10-08 ENCOUNTER — Other Ambulatory Visit (HOSPITAL_COMMUNITY): Payer: Self-pay

## 2021-10-15 ENCOUNTER — Other Ambulatory Visit (HOSPITAL_COMMUNITY): Payer: Self-pay | Admitting: Cardiology

## 2021-10-15 ENCOUNTER — Other Ambulatory Visit (HOSPITAL_COMMUNITY): Payer: Self-pay

## 2021-10-16 ENCOUNTER — Ambulatory Visit (HOSPITAL_COMMUNITY)
Admission: RE | Admit: 2021-10-16 | Discharge: 2021-10-16 | Disposition: A | Discharge status: Home / Self Care | Payer: Medicare Other | Source: Ambulatory Visit | Attending: Cardiology | Admitting: Cardiology

## 2021-10-16 ENCOUNTER — Other Ambulatory Visit (HOSPITAL_COMMUNITY): Payer: Self-pay

## 2021-10-16 DIAGNOSIS — I701 Atherosclerosis of renal artery: Secondary | ICD-10-CM | POA: Diagnosis present

## 2021-10-16 MED ORDER — SPIRONOLACTONE 25 MG PO TABS
12.5000 mg | ORAL_TABLET | Freq: Every day | ORAL | 2 refills | Status: DC
  Filled 2021-10-16: qty 15, 30d supply, fill #0
  Filled 2021-12-02: qty 15, 30d supply, fill #1
  Filled 2022-01-10: qty 15, 30d supply, fill #2
  Filled 2022-02-22: qty 15, 30d supply, fill #3
  Filled 2022-04-01: qty 15, 30d supply, fill #4
  Filled 2022-05-15 (×2): qty 15, 30d supply, fill #5
  Filled 2022-07-20: qty 15, 30d supply, fill #6
  Filled 2022-09-02: qty 15, 30d supply, fill #7

## 2021-10-24 ENCOUNTER — Other Ambulatory Visit (HOSPITAL_COMMUNITY): Payer: Self-pay

## 2021-11-09 ENCOUNTER — Other Ambulatory Visit (HOSPITAL_COMMUNITY): Payer: Self-pay | Admitting: Cardiology

## 2021-11-10 ENCOUNTER — Other Ambulatory Visit (HOSPITAL_COMMUNITY): Payer: Self-pay

## 2021-11-10 MED ORDER — POTASSIUM CHLORIDE CRYS ER 20 MEQ PO TBCR
20.0000 meq | EXTENDED_RELEASE_TABLET | Freq: Every day | ORAL | 3 refills | Status: DC
  Filled 2021-11-10: qty 90, 90d supply, fill #0
  Filled 2022-02-22: qty 90, 90d supply, fill #1
  Filled 2022-06-22: qty 90, 90d supply, fill #2
  Filled 2022-10-20: qty 90, 90d supply, fill #3

## 2021-11-12 ENCOUNTER — Telehealth (HOSPITAL_COMMUNITY): Payer: Self-pay

## 2021-11-12 NOTE — Telephone Encounter (Signed)
Called and was unable to leave a voice message to confirm/remind patient of their appointment at the [facility] on 11/13/21.

## 2021-11-13 ENCOUNTER — Encounter (HOSPITAL_COMMUNITY): Payer: Medicare Other

## 2021-11-24 ENCOUNTER — Other Ambulatory Visit (HOSPITAL_COMMUNITY): Payer: Self-pay

## 2021-12-02 ENCOUNTER — Other Ambulatory Visit (HOSPITAL_COMMUNITY): Payer: Self-pay | Admitting: Cardiology

## 2021-12-02 ENCOUNTER — Other Ambulatory Visit (HOSPITAL_COMMUNITY): Payer: Self-pay

## 2021-12-02 DIAGNOSIS — I480 Paroxysmal atrial fibrillation: Secondary | ICD-10-CM

## 2021-12-03 ENCOUNTER — Other Ambulatory Visit (HOSPITAL_COMMUNITY): Payer: Self-pay

## 2021-12-03 MED ORDER — RIVAROXABAN 20 MG PO TABS
20.0000 mg | ORAL_TABLET | Freq: Every day | ORAL | 3 refills | Status: DC
  Filled 2021-12-03: qty 30, 30d supply, fill #0
  Filled 2022-01-10 – 2022-02-23 (×2): qty 30, 30d supply, fill #1
  Filled 2022-04-01: qty 30, 30d supply, fill #2
  Filled 2022-05-15 (×2): qty 30, 30d supply, fill #3

## 2021-12-03 NOTE — Telephone Encounter (Signed)
Prescription refill request for Xarelto received.  Indication: Afib  Last office visit: 08/13/21 Aundra Dubin)  Weight: 68kg Age: 66 Scr: 1.22 (08/13/21)  CrCl: 58.67ml/min  Appropriate dose and refill sent to requested pharmacy

## 2021-12-04 ENCOUNTER — Other Ambulatory Visit (HOSPITAL_COMMUNITY): Payer: Self-pay

## 2021-12-29 ENCOUNTER — Ambulatory Visit: Payer: Medicare Other | Admitting: Cardiology

## 2022-01-10 ENCOUNTER — Other Ambulatory Visit (HOSPITAL_COMMUNITY): Payer: Self-pay | Admitting: Cardiology

## 2022-01-12 ENCOUNTER — Other Ambulatory Visit (HOSPITAL_COMMUNITY): Payer: Self-pay

## 2022-01-12 MED ORDER — FUROSEMIDE 40 MG PO TABS
ORAL_TABLET | ORAL | 0 refills | Status: DC
  Filled 2022-01-12: qty 30, 45d supply, fill #0

## 2022-01-13 ENCOUNTER — Other Ambulatory Visit (HOSPITAL_COMMUNITY): Payer: Self-pay

## 2022-01-13 ENCOUNTER — Telehealth (HOSPITAL_COMMUNITY): Payer: Self-pay

## 2022-01-13 NOTE — Telephone Encounter (Signed)
Advanced Heart Failure Patient Advocate Encounter  The patient was approved for a [employer] grant that will help cover the cost of Entresto.  Total amount awarded, $10,000.  Effective: 12/14/2021 - 12/14/2022.  BIN [id number] PCN [id number] Group [id number] ID [id number]  Patient provided with approval and processing information in office.  Burnell Blanks, CPhT Rx Patient Advocate Phone: [phone]

## 2022-02-22 ENCOUNTER — Other Ambulatory Visit (HOSPITAL_COMMUNITY): Payer: Self-pay | Admitting: Cardiology

## 2022-02-23 ENCOUNTER — Other Ambulatory Visit (HOSPITAL_COMMUNITY): Payer: Self-pay

## 2022-02-23 MED ORDER — FUROSEMIDE 40 MG PO TABS
ORAL_TABLET | ORAL | 0 refills | Status: DC
  Filled 2022-02-23: qty 30, 45d supply, fill #0

## 2022-04-01 ENCOUNTER — Other Ambulatory Visit (HOSPITAL_COMMUNITY): Payer: Self-pay | Admitting: Cardiology

## 2022-04-02 ENCOUNTER — Other Ambulatory Visit (HOSPITAL_COMMUNITY): Payer: Self-pay

## 2022-04-02 MED ORDER — FUROSEMIDE 40 MG PO TABS
ORAL_TABLET | ORAL | 0 refills | Status: DC
  Filled 2022-04-02: qty 30, 45d supply, fill #0

## 2022-04-27 ENCOUNTER — Other Ambulatory Visit (HOSPITAL_COMMUNITY): Payer: Self-pay | Admitting: Cardiology

## 2022-05-15 ENCOUNTER — Other Ambulatory Visit (HOSPITAL_COMMUNITY): Payer: Self-pay

## 2022-05-15 ENCOUNTER — Other Ambulatory Visit (HOSPITAL_COMMUNITY): Payer: Self-pay | Admitting: Cardiology

## 2022-05-15 MED ORDER — FUROSEMIDE 40 MG PO TABS
ORAL_TABLET | ORAL | 0 refills | Status: DC
  Filled 2022-05-15: qty 30, 45d supply, fill #0
  Filled 2022-06-04: qty 30, 20d supply, fill #0

## 2022-05-29 ENCOUNTER — Other Ambulatory Visit (HOSPITAL_COMMUNITY): Payer: Self-pay

## 2022-06-04 ENCOUNTER — Other Ambulatory Visit (HOSPITAL_COMMUNITY): Payer: Self-pay

## 2022-06-22 ENCOUNTER — Other Ambulatory Visit (HOSPITAL_COMMUNITY): Payer: Self-pay

## 2022-06-25 ENCOUNTER — Other Ambulatory Visit (HOSPITAL_COMMUNITY): Payer: Self-pay

## 2022-07-02 ENCOUNTER — Other Ambulatory Visit (HOSPITAL_COMMUNITY): Payer: Self-pay | Admitting: Cardiology

## 2022-07-02 ENCOUNTER — Other Ambulatory Visit (HOSPITAL_COMMUNITY): Payer: Self-pay

## 2022-07-02 DIAGNOSIS — I480 Paroxysmal atrial fibrillation: Secondary | ICD-10-CM

## 2022-07-02 MED ORDER — RIVAROXABAN 20 MG PO TABS
20.0000 mg | ORAL_TABLET | Freq: Every day | ORAL | 3 refills | Status: DC
  Filled 2022-07-02: qty 30, 30d supply, fill #0
  Filled 2022-08-18 (×2): qty 30, 30d supply, fill #1
  Filled 2022-09-14: qty 30, 30d supply, fill #2
  Filled 2022-11-11: qty 30, 30d supply, fill #3

## 2022-07-27 ENCOUNTER — Other Ambulatory Visit (HOSPITAL_COMMUNITY): Payer: Self-pay

## 2022-07-27 ENCOUNTER — Other Ambulatory Visit (HOSPITAL_COMMUNITY): Payer: Self-pay | Admitting: Cardiology

## 2022-07-28 ENCOUNTER — Other Ambulatory Visit (HOSPITAL_COMMUNITY): Payer: Self-pay | Admitting: Cardiology

## 2022-07-28 ENCOUNTER — Other Ambulatory Visit (HOSPITAL_COMMUNITY): Payer: Self-pay

## 2022-07-28 MED ORDER — FUROSEMIDE 40 MG PO TABS
ORAL_TABLET | ORAL | 0 refills | Status: DC
  Filled 2022-07-28: qty 30, 40d supply, fill #0

## 2022-07-28 MED ORDER — ENTRESTO 24-26 MG PO TABS
1.0000 | ORAL_TABLET | Freq: Two times a day (BID) | ORAL | 11 refills | Status: DC
  Filled 2022-07-28: qty 60, 30d supply, fill #0
  Filled 2022-09-14: qty 60, 30d supply, fill #1
  Filled 2022-11-11: qty 60, 30d supply, fill #2
  Filled 2023-01-13 – 2023-04-05 (×3): qty 60, 30d supply, fill #3
  Filled 2023-05-27: qty 60, 30d supply, fill #4

## 2022-07-28 MED ORDER — ROSUVASTATIN CALCIUM 40 MG PO TABS
40.0000 mg | ORAL_TABLET | Freq: Every day | ORAL | 0 refills | Status: DC
  Filled 2022-07-28: qty 30, 30d supply, fill #0

## 2022-08-14 ENCOUNTER — Encounter (HOSPITAL_COMMUNITY): Payer: Self-pay | Admitting: Cardiology

## 2022-08-14 ENCOUNTER — Ambulatory Visit (HOSPITAL_COMMUNITY)
Admission: RE | Admit: 2022-08-14 | Discharge: 2022-08-14 | Disposition: A | Discharge status: Home / Self Care | Payer: Medicare Other | Source: Ambulatory Visit | Attending: Cardiology | Admitting: Cardiology

## 2022-08-14 VITALS — BP 110/60 | HR 73 | Wt 155.2 lb

## 2022-08-14 DIAGNOSIS — Z8249 Family history of ischemic heart disease and other diseases of the circulatory system: Secondary | ICD-10-CM | POA: Insufficient documentation

## 2022-08-14 DIAGNOSIS — I4892 Unspecified atrial flutter: Secondary | ICD-10-CM | POA: Diagnosis not present

## 2022-08-14 DIAGNOSIS — I341 Nonrheumatic mitral (valve) prolapse: Secondary | ICD-10-CM | POA: Insufficient documentation

## 2022-08-14 DIAGNOSIS — I340 Nonrheumatic mitral (valve) insufficiency: Secondary | ICD-10-CM | POA: Diagnosis not present

## 2022-08-14 DIAGNOSIS — I69354 Hemiplegia and hemiparesis following cerebral infarction affecting left non-dominant side: Secondary | ICD-10-CM | POA: Insufficient documentation

## 2022-08-14 DIAGNOSIS — Z79899 Other long term (current) drug therapy: Secondary | ICD-10-CM | POA: Diagnosis not present

## 2022-08-14 DIAGNOSIS — I5032 Chronic diastolic (congestive) heart failure: Secondary | ICD-10-CM

## 2022-08-14 DIAGNOSIS — I110 Hypertensive heart disease with heart failure: Secondary | ICD-10-CM | POA: Insufficient documentation

## 2022-08-14 DIAGNOSIS — Z7901 Long term (current) use of anticoagulants: Secondary | ICD-10-CM | POA: Insufficient documentation

## 2022-08-14 DIAGNOSIS — I480 Paroxysmal atrial fibrillation: Secondary | ICD-10-CM

## 2022-08-14 DIAGNOSIS — F17210 Nicotine dependence, cigarettes, uncomplicated: Secondary | ICD-10-CM | POA: Insufficient documentation

## 2022-08-14 DIAGNOSIS — Z5986 Financial insecurity: Secondary | ICD-10-CM | POA: Diagnosis not present

## 2022-08-14 DIAGNOSIS — Q242 Cor triatriatum: Secondary | ICD-10-CM | POA: Insufficient documentation

## 2022-08-14 LAB — CBC
HCT: 44.4 % (ref 39.0–52.0)
Hemoglobin: 15 g/dL (ref 13.0–17.0)
MCH: 31.5 pg (ref 26.0–34.0)
MCHC: 33.8 g/dL (ref 30.0–36.0)
MCV: 93.3 fL (ref 80.0–100.0)
Platelets: 248 10*3/uL (ref 150–400)
RBC: 4.76 MIL/uL (ref 4.22–5.81)
RDW: 13.1 % (ref 11.5–15.5)
WBC: 7.2 10*3/uL (ref 4.0–10.5)
nRBC: 0 % (ref 0.0–0.2)

## 2022-08-14 LAB — BASIC METABOLIC PANEL
Anion gap: 7 (ref 5–15)
BUN: 8 mg/dL (ref 8–23)
CO2: 25 mmol/L (ref 22–32)
Calcium: 9.1 mg/dL (ref 8.9–10.3)
Chloride: 101 mmol/L (ref 98–111)
Creatinine, Ser: 1.18 mg/dL (ref 0.61–1.24)
GFR, Estimated: >60 mL/min (ref >60)
Glucose, Bld: 102 mg/dL — ABNORMAL HIGH (ref 70–99)
Potassium: 3.7 mmol/L (ref 3.5–5.1)
Sodium: 133 mmol/L — ABNORMAL LOW (ref 135–145)

## 2022-08-14 LAB — TSH: TSH: 1.809 u[IU]/mL (ref 0.350–4.500)

## 2022-08-14 LAB — BRAIN NATRIURETIC PEPTIDE: B Natriuretic Peptide: 120.6 pg/mL — ABNORMAL HIGH (ref 0.0–100.0)

## 2022-08-14 NOTE — Patient Instructions (Signed)
There has been no changes to your medications.  Labs done today, your results will be available in MyChart, we will contact you for abnormal readings.  Your physician has requested that you have an echocardiogram. Echocardiography is a painless test that uses sound waves to create images of your heart. It provides your doctor with information about the size and shape of your heart and how well your heart's chambers and valves are working. This procedure takes approximately one hour. There are no restrictions for this procedure. Please do NOT wear cologne, perfume, aftershave, or lotions (deodorant is allowed). Please arrive 15 minutes prior to your appointment time.  You have been referred to Dr. Elberta Fortis. His office will call you to arrange your appointment.  Your physician recommends that you schedule a follow-up appointment in: 4 months.  If you have any questions or concerns before your next appointment please send [place] a message through [employer] or call our office at [phone].    TO LEAVE A MESSAGE FOR THE NURSE SELECT OPTION 2, PLEASE LEAVE A MESSAGE INCLUDING: YOUR NAME DATE OF BIRTH CALL BACK NUMBER REASON FOR CALL**this is important as we prioritize the call backs  YOU WILL RECEIVE A CALL BACK THE SAME DAY AS LONG AS YOU CALL BEFORE 4:00 PM  At the [facility], you and your health needs are our priority. As part of our continuing mission to provide you with exceptional heart care, we have created designated Provider Care Teams. These Care Teams include your primary Cardiologist (physician) and Advanced Practice Providers (APPs- Physician Assistants and Nurse Practitioners) who all work together to provide you with the care you need, when you need it.   You may see any of the following providers on your designated Care Team at your next follow up: Dr Arvilla Meres Dr Marca Ancona Dr. Marcos Eke, NP Robbie Lis, [place] [facility] [place], [place] Brynda Peon, NP Karle Plumber, PharmD   Please be sure to bring in all your medications bottles to every appointment.    Thank you for choosing [facility]

## 2022-08-16 NOTE — Progress Notes (Addendum)
Primary Care: Porfirio Oar, PA  Primary Cardiologist: Lesleigh Noe, MD (Inactive) HF Caridology: Dr. Shirlee Latch  67 y/o male w/ chronic diastolic heart failure, labile BP, tobacco use, HLD, CVA and atrial fibrillation was referred to CHF clinic from [facility] clinic.    Patient had cryptogenic stroke in 2016.  Placed on implantable loop recorder with finding of atrial fibrillation. Placed on Xarelto for anticoagulation. Echo then showed normal LVEF, 65-70%, normal RV. 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  He had 2 recent admissions in early 2023 for acute flash pulmonary edema/hypertensive urgency.   1st Admit 3/24-3/27/23: Presented w/ respiratory distress. Did not tolerate CPAP and placed on 15 L nonrebreather.  Intubated in the emergency room. Admitted for acute hypoxic respiratory failure in setting of flash pulmonary edema due to hypertensive urgency. He was also in Afib and felt to have possible PNA. Treated with broad-spectrum antibiotic.  Placed on heparin for elevated HS troponin [id number]>>731. Echo showed normal LVEF, 65%. TSH normal. UDS + for THC and Benzos.  Admission also notable for intermittent atrial flutter/fibrillation and development of a WCT that required emergent bedside defibrillation. Placed on IV amio and later transitioned to PO. LHC was done to r/o ischemic substrate. This showed mild, nonobstructive CAD. LVEDP was normal.  EP was also consulted for recommendations and recommend continuation of amio and ? blocker . His BP ultimately normalized and was actually running in low normal range with only little medications. Given this, renal artery stenosis was felt to be an unlikely culprit for pulmonary edema given normalization of pressures. He was discharged home on Metoprolol 50, amiodarone 200, Xarelto 20 and Crestor 20. Discharge home on 4/3.   Readmitted 05/25/21: presented w/ recurrent respiratory distress/ acute hypoxic respiratory failure in setting  of flash pulmonary edema and hypertensive urgency. BP was 165/126 and later 210/104. He was in atrial flutter. UDS + for THC only. Required BiPAP, IV Lasix, Nitropaste followed by IV nitroglycerin drip that was later tapered off. SCr had bumped from baseline of 1.03>>1.67. Diuretics later held. Repeat limited echo showed EF 55-60%, normal RV, mid-moderately thickened mitral and aortic valves but no MR nor AI. Similar to previous hospitalization, his BP normalized and returned to low normal range, limiting treatment w/ standing dose antihypertensive. His metoprolol was also later discontinued due to bradycardia. SCr returned to baseline. He was continued on PO amio and PO lasix restarted, 40 mg daily. Referred to [facility] clinic.   Cardiac MRI was done in 5/23 showing LV EF 58%, RV EF 56%, no myocardial LGE, no definite evidence for cor triatriatum dextrum but study may not have had the resolution to detect a thin wall in the RA, at least moderate MR.    TEE was done in 6/23, showing EF 55-60%, normal RV, prominent Eustachian valve vs incomplete cor triatriatum with no significant obstruction to flow, small PFO, mild bileaflet mitral valve prolapse with moderate MR (PISA ERO 0.24 cm^2).   Patient presents today for followup of CHF and atrial fibrillation.  He has been lost to followup for about a year.  Never got back in with EP for atrial fibrillation ablation, continues on low dose amiodarone.  He is in NSR today.  He denies palpitations. No chest pain.  No significant exertional dyspnea. No orthopnea/PND.  Weight up 2 lbs. He is still smoking 1/2 ppd.   ECG (personally reviewed): NSR, normal  Labs (4/23): K 4.4, creatinine 1.23 Labs (5/23): K 4.4, creatinine 1.2, TSH  normal, LDL 101, BNP 29, LFTs normal  PMH: 1. CVA: 2016, related to atrial fibrillation. Left arm weakness.  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  - Cardiac MRI in 5/23 showed LV EF 58%, RV EF 56%, no myocardial LGE, at least  moderate MR, no definite evidence for cor triatriatum in the RA but study may not have had the resolution to detect a thin wall in the RA.   3.  Atrial fibrillation: Paroxysmal.  4.  HTN - Renal artery dopplers (8/23): No evidence for renal artery stenosis.  5.  Diastolic CHF: Echo (4/23) with EF 55-60%, normal RV, IVC normal.  - LHC (3/23): luminal irregularities.  - Cardiac MRI (5/23): LV EF 58%, RV EF 56%, no myocardial LGE, at least moderate MR, no definite evidence for cor triatriatum in the RA but study may not have had the resolution to detect a thin wall in the RA.   - TEE (6/23): EF 55-60%, normal RV, prominent Eustachian valve vs incomplete cor triatriatum with no significant obstruction to flow, small PFO, mild bileaflet mitral valve prolapse with moderate MR (PISA ERO 0.24 cm^2). 6. Gout 7. Mitral valve prolapse: Moderate MR on TEE in 6/23.   Review of Systems: All systems reviewed and negative except as per HPI.  Current Outpatient Medications  Medication Sig Dispense Refill   amiodarone (PACERONE) 200 MG tablet Take 0.5 tablets (100 mg total) by mouth daily. 45 tablet 3   furosemide (LASIX) 40 MG tablet Alternate taking 1 tablet (40 mg total) by mouth daily AND 1/2 tablet (20 mg total) daily. Must be seen in office for further refills. 30 tablet 0   potassium chloride SA (KLOR-CON M) 20 MEQ tablet Take 1 tablet (20 mEq total) by mouth daily. 90 tablet 3   rivaroxaban (XARELTO) 20 MG TABS tablet Take 1 tablet (20 mg total) by mouth daily with supper. 30 tablet 3   rosuvastatin (CRESTOR) 40 MG tablet Take 1 tablet (40 mg total) by mouth at bedtime. 30 tablet 0   sacubitril-valsartan (ENTRESTO) 24-26 MG Take 1 tablet by mouth 2 (two) times daily. 60 tablet 11   spironolactone (ALDACTONE) 25 MG tablet Take 0.5 tablets (12.5 mg total) by mouth daily. 60 tablet 2   No current facility-administered medications for this encounter.    No Known Allergies    Social History    Socioeconomic History   Marital status: Widowed    Spouse name: Marylu Lund   Number of children: 3   Years of education: 9   Highest education level: 8th grade  Occupational History   Occupation: former Merchandiser, retail    Comment: disabled due to CVA  Tobacco Use   Smoking status: Every Day    Packs/day: 2.50    Years: 40.00    Additional pack years: 0.00    Total pack years: 100.00    Types: Cigarettes   Smokeless tobacco: Never   Tobacco comments:    cut back after stroke, but increased again due to depression  Vaping Use   Vaping Use: Never used  Substance and Sexual Activity   Alcohol use: Not Currently   Drug use: Yes    Frequency: 14.0 times per week    Types: Marijuana    Comment: trying to help his appetite   Sexual activity: Yes    Partners: Female  Other Topics Concern   Not on file  Social History Narrative   Lives at home with wife, Marylu Lund   Caffeine use -  tea 4-5 glasses a day   Social Determinants of Health   Financial Resource Strain: High Risk (05/27/2021)   Overall Financial Resource Strain (CARDIA)    Difficulty of Paying Living Expenses: Hard  Food Insecurity: No Food Insecurity (05/27/2021)   Hunger Vital Sign    Worried About Running Out of Food in the Last Year: Never true    Ran Out of Food in the Last Year: Never true  Transportation Needs: No Transportation Needs (05/27/2021)   PRAPARE - Administrator, Civil Service (Medical): No    Lack of Transportation (Non-Medical): No  Physical Activity: Inactive (05/15/2017)   Exercise Vital Sign    Days of Exercise per Week: 0 days    Minutes of Exercise per Session: 0 min  Stress: Not on file  Social Connections: Not on file  Intimate Partner Violence: Not on file      Family History  Problem Relation Age of Onset   Cancer Father        leukemia   Heart attack Mother    Heart disease Brother    Heart attack Brother 19       CABG, stenting   Hypertension Brother    Alcohol abuse  Brother     Vitals:   08/14/22 1139  BP: 110/60  Pulse: 73  SpO2: 99%  Weight: 70.4 kg (155 lb 3.2 oz)    PHYSICAL EXAM: General: NAD Neck: No JVD, no thyromegaly or thyroid nodule.  Lungs: Clear to auscultation bilaterally with normal respiratory effort. CV: Nondisplaced PMI.  Heart regular S1/S2, no S3/S4, 1/6 HSM apex.  No peripheral edema.  No carotid bruit.  Normal pedal pulses.  Abdomen: Soft, nontender, no hepatosplenomegaly, no distention.  Skin: Intact without lesions or rashes.  Neurologic: Alert and oriented x 3.  Psych: Normal affect. Extremities: No clubbing or cyanosis.  HEENT: Normal.   ASSESSMENT & PLAN: 1.  Chronic diastolic CHF:  Echo 4/23, EF ~60%, moderate hypertrophy of the basal septal segment. The rest of the LV segments demonstrate mild concentric left ventricular hypertrophy, RV normal, thickened mitral and aortic valves, mod LAE.  He has had admits for acute CHF w/ flash pulmonary edema/ hypertensive urgency. LHC (3/23) showed no significant CAD.  UDS negative for cocaine. BP has been labile and not persistently high.  Notably, episodes were associated with atrial fibrillation.  Cardiac MRI in 5/23 was not suggestive of infiltrative disease (no LGE) or hypertrophic cardiomyopathy but did suggest at least moderate MR (this was not noted on echoes).  TEE in 6/23 showed EF 55-60%, normal RV, prominent Eustachian valve vs incomplete cor triatriatum with no significant obstruction to flow, small PFO, mild bileaflet mitral valve prolapse with moderate MR (PISA ERO 0.24 cm^2).  No recent atrial fibrillation episodes with pulmonary edema. Today, he is not volume overloaded on exam.  NYHA class I symptoms.    - Need to maintain NSR, tolerates AF poorly (see below).  - Continue Entresto 24/26 bid, BMET/BNP today.   - He can continue Lasix 40 daily alternating with 20 daily.   - Continue spironolactone 12.5 daily.   2. Atrial fibrillation/flutter: NSR by exam today.   AF/AFL episodes associated with decompensated CHF.  Would like to maintain in NSR.  - Continue amiodarone 100 mg daily.  Check LFTs and TSH.  Will need regular eye exam.  - Patient has a prominent Eustachian valve vs incomplete cor triatriatum, but there is no significant obstruction to flow in the RA and  I think that atrial fibrillation ablation would be feasible.  He never went back to see Dr. Elberta Fortis to get set up for ablation (would allow [place] to stop amiodarone hopefully) => I will refer back to Dr. Elberta Fortis today.  3. HTN: BP controlled.  Renal artery dopplers in 8/23 were normal.  4. Cor Triatriatum of RA: On TEE in 6/23, patient has a prominent Eustachian valve vs incomplete cor triatriatum, but there is no significant obstruction to flow in the RA and I think that atrial fibrillation ablation would be feasible. 5. Mitral regurgitation: Mitral valve prolapse with moderate MR on TEE in 6/23.   - I will repeat an echo to assess for MR.  6. Smoking: I strongly urged him to quit.   Followup 4 months with APP.   Marca Ancona 08/16/2022

## 2022-08-18 ENCOUNTER — Other Ambulatory Visit (HOSPITAL_COMMUNITY): Payer: Self-pay

## 2022-09-02 ENCOUNTER — Other Ambulatory Visit (HOSPITAL_COMMUNITY): Payer: Self-pay | Admitting: Cardiology

## 2022-09-03 ENCOUNTER — Other Ambulatory Visit: Payer: Self-pay

## 2022-09-03 ENCOUNTER — Other Ambulatory Visit (HOSPITAL_COMMUNITY): Payer: Self-pay

## 2022-09-03 MED ORDER — ROSUVASTATIN CALCIUM 40 MG PO TABS
40.0000 mg | ORAL_TABLET | Freq: Every day | ORAL | 0 refills | Status: DC
  Filled 2022-09-03: qty 30, 30d supply, fill #0

## 2022-09-14 ENCOUNTER — Other Ambulatory Visit (HOSPITAL_COMMUNITY): Payer: Self-pay | Admitting: Cardiology

## 2022-09-15 ENCOUNTER — Other Ambulatory Visit (HOSPITAL_COMMUNITY): Payer: Self-pay

## 2022-09-15 MED ORDER — FUROSEMIDE 40 MG PO TABS
60.0000 mg | ORAL_TABLET | Freq: Every day | ORAL | 3 refills | Status: DC
  Filled 2022-09-15: qty 90, 60d supply, fill #0
  Filled 2023-01-20: qty 90, 60d supply, fill #1
  Filled 2023-04-05 (×2): qty 90, 60d supply, fill #2
  Filled 2023-09-06: qty 90, 60d supply, fill #3

## 2022-09-16 ENCOUNTER — Other Ambulatory Visit (HOSPITAL_COMMUNITY): Payer: Self-pay

## 2022-09-17 ENCOUNTER — Ambulatory Visit (HOSPITAL_COMMUNITY)
Admission: RE | Admit: 2022-09-17 | Discharge: 2022-09-17 | Disposition: A | Discharge status: Home / Self Care | Payer: Medicare Other | Source: Ambulatory Visit | Attending: *Deleted | Admitting: *Deleted

## 2022-09-17 DIAGNOSIS — I5032 Chronic diastolic (congestive) heart failure: Secondary | ICD-10-CM | POA: Insufficient documentation

## 2022-09-17 DIAGNOSIS — I110 Hypertensive heart disease with heart failure: Secondary | ICD-10-CM | POA: Diagnosis not present

## 2022-09-17 DIAGNOSIS — I4891 Unspecified atrial fibrillation: Secondary | ICD-10-CM | POA: Insufficient documentation

## 2022-09-17 DIAGNOSIS — Z8673 Personal history of transient ischemic attack (TIA), and cerebral infarction without residual deficits: Secondary | ICD-10-CM | POA: Insufficient documentation

## 2022-09-17 DIAGNOSIS — I340 Nonrheumatic mitral (valve) insufficiency: Secondary | ICD-10-CM | POA: Insufficient documentation

## 2022-09-17 LAB — ECHOCARDIOGRAM COMPLETE
AR max vel: 2.23 cm2
AV Peak grad: 6.4 mmHg
Ao pk vel: 1.27 m/s
S' Lateral: 2.7 cm

## 2022-09-17 NOTE — Addendum Note (Signed)
Encounter addended by: Laurey Morale, MD on: 09/17/2022 10:42 AM  Actions taken: Clinical Note Signed

## 2022-09-19 IMAGING — MR MR CARD MORPHOLOGY WO/W CM
45 of 48 series · 45 of 48 positions shown · IV contrast (Contrast agent)
Comparison: none

CLINICAL DATA: Concern for cor triatriatum involving the right
atrium.

EXAM:
CARDIAC MRI
TECHNIQUE: The patient was scanned on a 1.5 Tesla GE magnet. A dedicated
cardiac coil was used. Functional imaging was done using [employer]iest[employer]
sequences. [DATE], and 4 chamber views were done to assess for RWMA's.
Modified [employer] rule using a short axis stack was used to
calculate an ejection fraction on a dedicated work station using
Circle software. The patient received 7 cc of Gadavist. After 10
minutes inversion recovery sequences were used to assess for
infiltration and scar tissue.
CONTRAST:  Gadavist 7 cc

[Series 4: [id number] · axial · 8.0mm · 1.41mm/px · 1 of 18 slices shown]
[im 1/18]
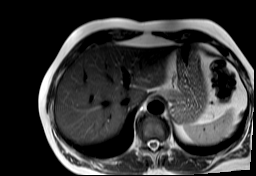

[Series 8: bSSFP · oblique · 8.0mm · 1.61mm/px · 1 of 25 slices shown (1 of 19)]
[im 1/25]
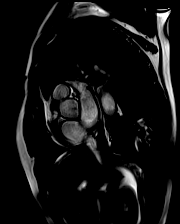

[Series 9: bSSFP · oblique · 8.0mm · 1.61mm/px · 1 of 25 slices shown (2 of 19)]
[im 1/25]
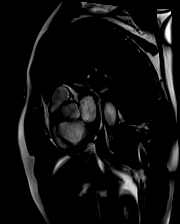

[Series 10: bSSFP · oblique · 8.0mm · 1.61mm/px · 1 of 25 slices shown (3 of 19)]
[im 1/25]
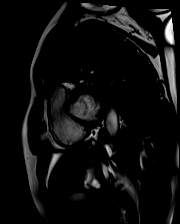

[Series 11: bSSFP · oblique · 8.0mm · 1.61mm/px · 1 of 25 slices shown (4 of 19)]
[im 1/25]
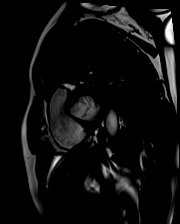

[Series 12: bSSFP · oblique · 8.0mm · 1.61mm/px · 1 of 25 slices shown (5 of 19)]
[im 1/25]
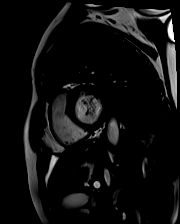

[Series 13: bSSFP · oblique · 8.0mm · 1.61mm/px · 1 of 25 slices shown (6 of 19)]
[im 1/25]
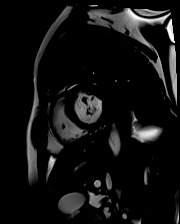

[Series 14: bSSFP · oblique · 8.0mm · 1.61mm/px · 1 of 25 slices shown (7 of 19)]
[im 1/25]
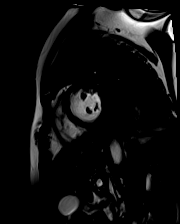

[Series 15: bSSFP · oblique · 8.0mm · 1.61mm/px · 1 of 25 slices shown (8 of 19)]
[im 1/25]
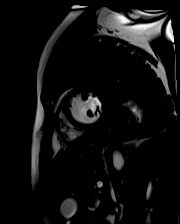

[Series 16: bSSFP · oblique · 8.0mm · 1.61mm/px · 1 of 25 slices shown (9 of 19)]
[im 1/25]
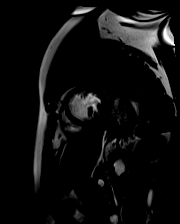

[Series 17: bSSFP · oblique · 8.0mm · 1.61mm/px · 1 of 25 slices shown (10 of 19)]
[im 1/25]
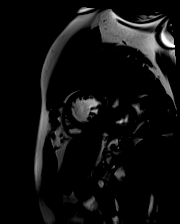

[Series 18: bSSFP · oblique · 8.0mm · 1.61mm/px · 1 of 25 slices shown (11 of 19)]
[im 1/25]
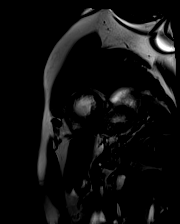

[Series 19: bSSFP · oblique · 8.0mm · 1.61mm/px · 1 of 25 slices shown (12 of 19)]
[im 1/25]
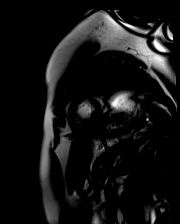

[Series 20: bSSFP · oblique · 8.0mm · 1.61mm/px · 1 of 25 slices shown (13 of 19)]
[im 1/25]
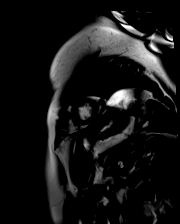

[Series 21: bSSFP · oblique · 8.0mm · 1.61mm/px · 1 of 25 slices shown (14 of 19)]
[im 1/25]
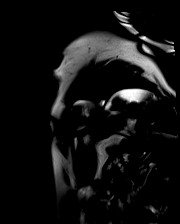

[Series 22: bSSFP · oblique · 8.0mm · 1.61mm/px · 1 of 25 slices shown (15 of 19)]
[im 1/25]
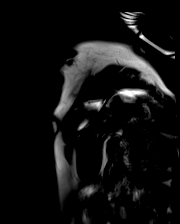

[Series 23: bSSFP · oblique · 8.0mm · 1.61mm/px · 1 of 25 slices shown (16 of 19)]
[im 1/25]
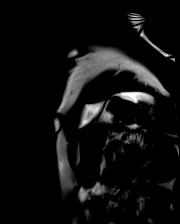

[Series 24: bSSFP · oblique · 6.0mm · 1.41mm/px · 1 of 25 slices shown (17 of 19)]
[im 1/25]
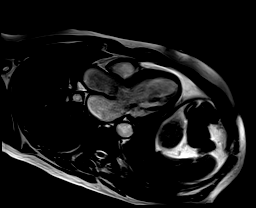

[Series 25: bSSFP · oblique · 6.0mm · 1.41mm/px · 1 of 25 slices shown (18 of 19)]
[im 1/25]
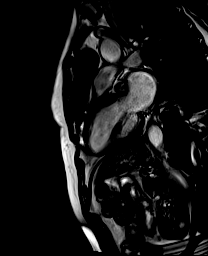

[Series 26: bSSFP · oblique · 6.0mm · 1.41mm/px · 1 of 25 slices shown (19 of 19)]
[im 1/25]
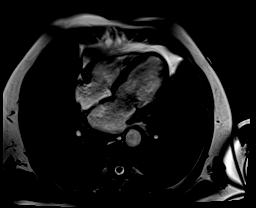

[Series 27: cine_trufi_short axis_cs_2_shot · oblique · 8.0mm · 1.48mm/px · 1 of 25 slices shown (1 of 18)]
[im 1/25]
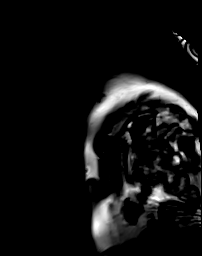

[Series 27: cine_trufi_short axis_cs_2_shot · oblique · 8.0mm · 1.48mm/px · 1 of 25 slices shown (2 of 18)]
[im 1/25]
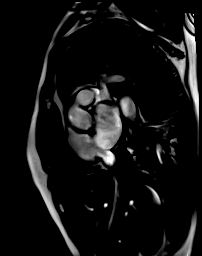

[Series 27: cine_trufi_short axis_cs_2_shot · oblique · 8.0mm · 1.48mm/px · 1 of 25 slices shown (3 of 18)]
[im 1/25]
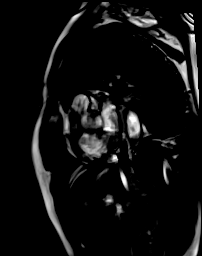

[Series 27: cine_trufi_short axis_cs_2_shot · oblique · 8.0mm · 1.48mm/px · 1 of 25 slices shown (4 of 18)]
[im 1/25]
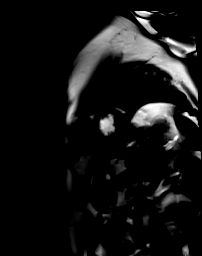

[Series 27: cine_trufi_short axis_cs_2_shot · oblique · 8.0mm · 1.48mm/px · 1 of 25 slices shown (5 of 18)]
[im 1/25]
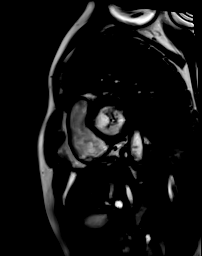

[Series 27: cine_trufi_short axis_cs_2_shot · oblique · 8.0mm · 1.48mm/px · 1 of 25 slices shown (6 of 18)]
[im 1/25]
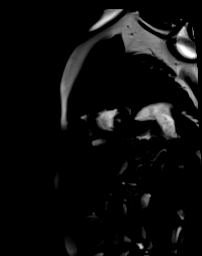

[Series 27: cine_trufi_short axis_cs_2_shot · oblique · 8.0mm · 1.48mm/px · 1 of 25 slices shown (7 of 18)]
[im 1/25]
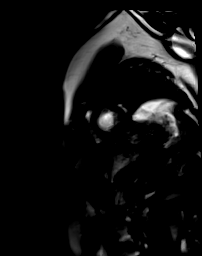

[Series 27: cine_trufi_short axis_cs_2_shot · oblique · 8.0mm · 1.48mm/px · 1 of 25 slices shown (8 of 18)]
[im 1/25]
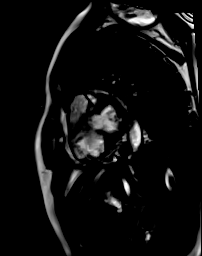

[Series 27: cine_trufi_short axis_cs_2_shot · oblique · 8.0mm · 1.48mm/px · 1 of 25 slices shown (9 of 18)]
[im 1/25]
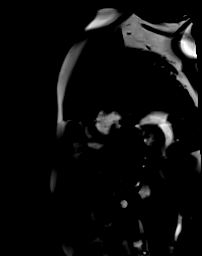

[Series 27: cine_trufi_short axis_cs_2_shot · oblique · 8.0mm · 1.48mm/px · 1 of 25 slices shown (10 of 18)]
[im 1/25]
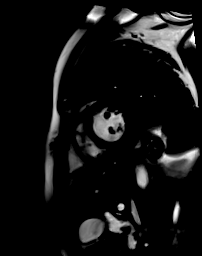

[Series 27: cine_trufi_short axis_cs_2_shot · oblique · 8.0mm · 1.48mm/px · 1 of 25 slices shown (11 of 18)]
[im 1/25]
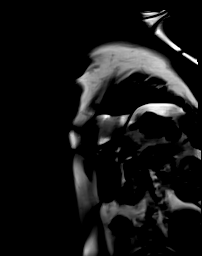

[Series 27: cine_trufi_short axis_cs_2_shot · oblique · 8.0mm · 1.48mm/px · 1 of 25 slices shown (12 of 18)]
[im 1/25]
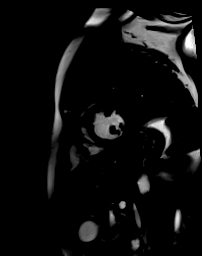

[Series 27: cine_trufi_short axis_cs_2_shot · oblique · 8.0mm · 1.48mm/px · 1 of 25 slices shown (13 of 18)]
[im 1/25]
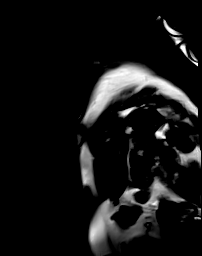

[Series 27: cine_trufi_short axis_cs_2_shot · oblique · 8.0mm · 1.48mm/px · 1 of 25 slices shown (14 of 18)]
[im 1/25]
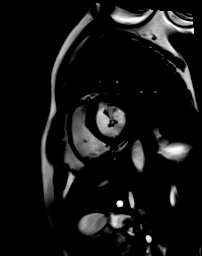

[Series 27: cine_trufi_short axis_cs_2_shot · oblique · 8.0mm · 1.48mm/px · 1 of 25 slices shown (15 of 18)]
[im 1/25]
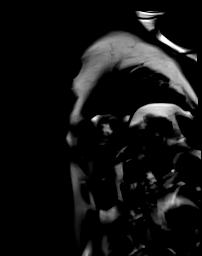

[Series 27: cine_trufi_short axis_cs_2_shot · oblique · 8.0mm · 1.48mm/px · 1 of 25 slices shown (16 of 18)]
[im 1/25]
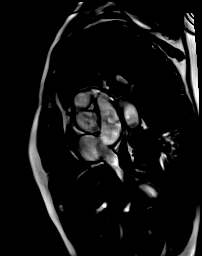

[Series 27: cine_trufi_short axis_cs_2_shot · oblique · 8.0mm · 1.48mm/px · 1 of 25 slices shown (17 of 18)]
[im 1/25]
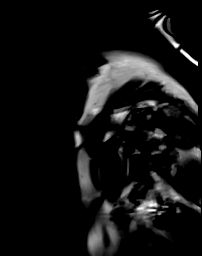

[Series 27: cine_trufi_short axis_cs_2_shot · oblique · 8.0mm · 1.48mm/px · 1 of 25 slices shown (18 of 18)]
[im 1/25]
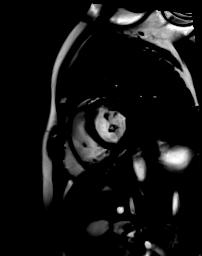

[Series 28: cine_trufi_cs_rt_short axis · oblique · 8.0mm · 1.73mm/px · 1 of 20 slices shown (1 of 7)]
[im 1/20]
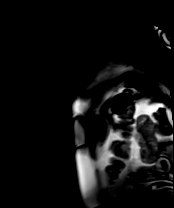

[Series 28: cine_trufi_cs_rt_short axis · oblique · 8.0mm · 1.73mm/px · 1 of 20 slices shown (2 of 7)]
[im 1/20]
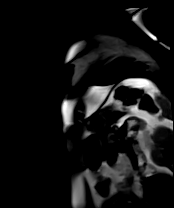

[Series 28: cine_trufi_cs_rt_short axis · oblique · 8.0mm · 1.73mm/px · 1 of 20 slices shown (3 of 7)]
[im 1/20]
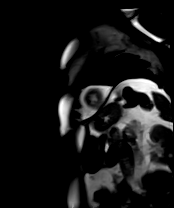

[Series 28: cine_trufi_cs_rt_short axis · oblique · 8.0mm · 1.73mm/px · 1 of 20 slices shown (4 of 7)]
[im 1/20]
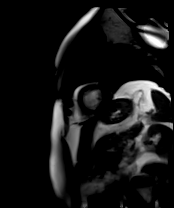

[Series 28: cine_trufi_cs_rt_short axis · oblique · 8.0mm · 1.73mm/px · 1 of 20 slices shown (5 of 7)]
[im 1/20]
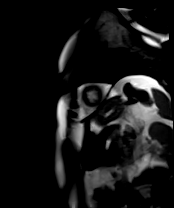

[Series 28: cine_trufi_cs_rt_short axis · oblique · 8.0mm · 1.73mm/px · 1 of 20 slices shown (6 of 7)]
[im 1/20]
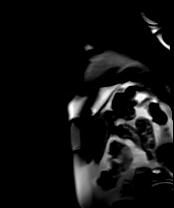

[Series 28: cine_trufi_cs_rt_short axis · oblique · 8.0mm · 1.73mm/px · 1 of 20 slices shown (7 of 7)]
[im 1/20]
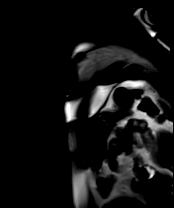

[45 of 48 positions shown; findings below may reference images not displayed]

FINDINGS: Limited images of the lung fields showed no gross abnormalities.

Normal left ventricular size and wall thickness, EF 58% with normal
wall motion. Normal right ventricular size and systolic function, EF
56%. Mild left atrial enlargement. Normal right atrial size. No
evidence for cor triatriatum noted. Trileaflet aortic valve, no
significant stenosis or regurgitation. There was at least moderate
mitral regurgitation present visually, but flow sequences to
quantify this were not done.

On delayed enhancement imaging, there was no myocardial late
gadolinium enhancement (LGE).

MEASUREMENTS:
MEASUREMENTS
LVEDV 148 mL

LVEDVi 82 mL/m2

LVSV 86 mL

LVEF 58%

RVEDV 111 mL

RVEDVi 62 mL/m2
RVSV 63 mL

RVEF 56%
IMPRESSION: 1.  Normal LV size and systolic function with EF 58%.

2.  Normal RV size and systolic function with EF 56%.

3. Mitral regurgitation was at least moderate visually, but flow
sequences to quantify were not done.

4. No myocardial LGE, so no definitive evidence for prior MI,
infiltrative disease, or myocarditis.

5. I did not see evidence for cor triatriatum involving the RV as
was apparently seen on 1783 TOTH. However, this study may not have
the resolution to detect a thin wall so cannot rule out. Would
consider repeating TOTH to assess for cor triatriatum prior to
attempting atrial fibrillation ablation and also to quantify the
mitral regurgitation.

Saibo Jiron

## 2022-09-24 ENCOUNTER — Ambulatory Visit (HOSPITAL_COMMUNITY)
Admission: RE | Admit: 2022-09-24 | Discharge: 2022-09-24 | Disposition: A | Discharge status: Home / Self Care | Payer: Medicare Other | Source: Ambulatory Visit | Attending: Cardiology | Admitting: Cardiology

## 2022-09-24 VITALS — BP 122/86 | HR 59 | Wt 153.2 lb

## 2022-09-24 DIAGNOSIS — I480 Paroxysmal atrial fibrillation: Secondary | ICD-10-CM | POA: Diagnosis present

## 2022-09-24 LAB — CBC
HCT: 45.8 % (ref 39.0–52.0)
Hemoglobin: 15.3 g/dL (ref 13.0–17.0)
MCH: 31.2 pg (ref 26.0–34.0)
MCHC: 33.4 g/dL (ref 30.0–36.0)
MCV: 93.5 fL (ref 80.0–100.0)
Platelets: 270 K/uL (ref 150–400)
RBC: 4.9 MIL/uL (ref 4.22–5.81)
RDW: 14 % (ref 11.5–15.5)
WBC: 8.5 K/uL (ref 4.0–10.5)
nRBC: 0 % (ref 0.0–0.2)

## 2022-09-24 LAB — BASIC METABOLIC PANEL WITH GFR
Anion gap: 9 (ref 5–15)
BUN: 8 mg/dL (ref 8–23)
CO2: 28 mmol/L (ref 22–32)
Calcium: 9.6 mg/dL (ref 8.9–10.3)
Chloride: 101 mmol/L (ref 98–111)
Creatinine, Ser: 1.21 mg/dL (ref 0.61–1.24)
GFR, Estimated: >60 mL/min
Glucose, Bld: 86 mg/dL (ref 70–99)
Potassium: 3.6 mmol/L (ref 3.5–5.1)
Sodium: 138 mmol/L (ref 135–145)

## 2022-09-24 NOTE — Progress Notes (Signed)
Patient presents to office for nurse visit EKG completed A Fib noted and confirmed by Anna Genre, NP  Ok to schedule DCCV Pt reports medication compliance and has NOT missed any doses of xarelto  Procedure scheduled for 8/30 @ 830am

## 2022-09-24 NOTE — Patient Instructions (Signed)
   Dear Governor Specking  You are scheduled for a Cardioversion on Friday, August 30 with Dr. Shirlee Latch.  Please arrive at the [facility] (Main Entrance A) at [facility]: [address] [place], [place] [address] at 7:30 AM (This time is 1 hour(s) before your procedure to ensure your preparation). Free valet parking service is available. You will check in at ADMITTING. The support person will be asked to wait in the waiting room.  It is OK to have someone drop you off and come back when you are ready to be discharged.     DIET:  Nothing to eat or drink after midnight except a sip of water with medications (see medication instructions below)  MEDICATION INSTRUCTIONS: !!IF ANY NEW MEDICATIONS ARE STARTED AFTER TODAY, PLEASE NOTIFY YOUR PROVIDER AS SOON AS POSSIBLE!!   Hold Lasix the day of your procedure 10/16/2022     Continue taking your anticoagulant (blood thinner): Rivaroxaban (Xarelto).  You will need to continue this after your procedure until you are told by your provider that it is safe to stop.    LABS:  Pre procedure labs done 09/24/22  FYI:  For your safety, and to allow [place] to monitor your vital signs accurately during the surgery/procedure we request: If you have artificial nails, gel coating, SNS etc, please have those removed prior to your surgery/procedure. Not having the nail coverings /polish removed may result in cancellation or delay of your surgery/procedure.  You must have a responsible person to drive you home and stay in the waiting area during your procedure. Failure to do so could result in cancellation.  Bring your insurance cards.  *Special Note: Every effort is made to have your procedure done on time. Occasionally there are emergencies that occur at the hospital that may cause delays. Please be patient if a delay does occur.

## 2022-10-12 ENCOUNTER — Other Ambulatory Visit (HOSPITAL_COMMUNITY): Payer: Self-pay

## 2022-10-12 DIAGNOSIS — I480 Paroxysmal atrial fibrillation: Secondary | ICD-10-CM

## 2022-10-12 NOTE — Progress Notes (Signed)
Orders entered for DCCV 8/30

## 2022-10-15 NOTE — Progress Notes (Signed)
Spoke to pt and instructed them to come at 0815 and to be NPO after 0000. Confirmed no missed doses of AC and instructed to take in AM with a small sip of water.   Confirmed that pt will have a ride home and someone to stay with them for 24 hours after the procedure.

## 2022-10-16 ENCOUNTER — Encounter: Payer: Self-pay | Admitting: Cardiology

## 2022-10-16 ENCOUNTER — Other Ambulatory Visit: Payer: Self-pay

## 2022-10-16 ENCOUNTER — Encounter (HOSPITAL_COMMUNITY)
Admission: RE | Disposition: A | Discharge status: Home / Self Care | Payer: Self-pay | Source: Ambulatory Visit | Attending: Cardiology

## 2022-10-16 ENCOUNTER — Encounter (HOSPITAL_COMMUNITY): Payer: Self-pay | Admitting: Cardiology

## 2022-10-16 ENCOUNTER — Ambulatory Visit (HOSPITAL_COMMUNITY)
Admission: RE | Admit: 2022-10-16 | Discharge: 2022-10-16 | Disposition: A | Discharge status: Home / Self Care | Payer: Medicare Other | Source: Ambulatory Visit | Attending: Cardiology | Admitting: Cardiology

## 2022-10-16 ENCOUNTER — Ambulatory Visit: Payer: Medicare Other | Admitting: Cardiology

## 2022-10-16 VITALS — BP 98/60 | HR 89 | Ht 66.0 in | Wt 150.0 lb

## 2022-10-16 DIAGNOSIS — I480 Paroxysmal atrial fibrillation: Secondary | ICD-10-CM | POA: Diagnosis not present

## 2022-10-16 DIAGNOSIS — Z539 Procedure and treatment not carried out, unspecified reason: Secondary | ICD-10-CM | POA: Diagnosis not present

## 2022-10-16 DIAGNOSIS — D6869 Other thrombophilia: Secondary | ICD-10-CM | POA: Diagnosis not present

## 2022-10-16 DIAGNOSIS — I5032 Chronic diastolic (congestive) heart failure: Secondary | ICD-10-CM

## 2022-10-16 DIAGNOSIS — I4819 Other persistent atrial fibrillation: Secondary | ICD-10-CM | POA: Diagnosis present

## 2022-10-16 DIAGNOSIS — I483 Typical atrial flutter: Secondary | ICD-10-CM | POA: Diagnosis not present

## 2022-10-16 SURGERY — CARDIOVERSION
Anesthesia: General

## 2022-10-16 MED ORDER — SODIUM CHLORIDE 0.9 % IV SOLN
INTRAVENOUS | Status: DC
Start: 2022-10-16 — End: 2022-10-16

## 2022-10-16 NOTE — Patient Instructions (Signed)
Medication Instructions:  Your physician recommends that you continue on your current medications as directed. Please refer to the Current Medication list given to you today.  *If you need a refill on your cardiac medications before your next appointment, please call your pharmacy*   Lab Work: Pre procedure labs -- see procedure instruction letter:  BMP & CBC  If you have labs (blood work) drawn today and your tests are completely normal, you will receive your results only [id number] Message (if you have MyChart). Otherwise no news is good news. If you have any lab test that is abnormal and we need to change your treatment, we will call you to review the results.   Testing/Procedures: Your physician has requested that you have cardiac CT within 10 days PRIOR to your ablation. Cardiac computed tomography (CT) is a painless test that uses an x-ray machine to take clear, detailed pictures of your heart.  Please follow instruction below located under "other instructions". You will get a call from our office to schedule the date for this test.  Your physician has recommended that you have an ablation. Catheter ablation is a medical procedure used to treat some cardiac arrhythmias (irregular heartbeats). During catheter ablation, a long, thin, flexible tube is put into a blood vessel in your groin (upper thigh), or neck. This tube is called an ablation catheter. It is then guided to your heart through the blood vessel. Radio frequency waves destroy small areas of heart tissue where abnormal heartbeats may cause an arrhythmia to start.   You will be scheduled for ________.  The EP scheduler, April, will be in touch with procedure date & instructions.   Follow-Up: At [facility], you and your health needs are our priority.  As part of our continuing mission to provide you with exceptional heart care, we have created designated Provider Care Teams.  These Care Teams include your primary Cardiologist  (physician) and Advanced Practice Providers (APPs -  Physician Assistants and Nurse Practitioners) who all work together to provide you with the care you need, when you need it.  Your next appointment:   1 month(s) after your ablation  The format for your next appointment:   In Person  Provider:   [facility] clinic   Thank you for choosing [facility]!!   Dory Horn, RN [phone]    Other Instructions   Cardiac Ablation Cardiac ablation is a procedure to destroy (ablate) some heart tissue that is sending bad signals. These bad signals cause problems in heart rhythm. The heart has many areas that make these signals. If there are problems in these areas, they can make the heart beat in a way that is not normal. Destroying some tissues can help make the heart rhythm normal. Tell your doctor about: Any allergies you have. All medicines you are taking. These include vitamins, herbs, eye drops, creams, and over-the-counter medicines. Any problems you or family members have had with medicines that make you fall asleep (anesthetics). Any blood disorders you have. Any surgeries you have had. Any medical conditions you have, such as kidney failure. Whether you are pregnant or may be pregnant. What are the risks? This is a safe procedure. But problems may occur, including: Infection. Bruising and bleeding. Bleeding into the chest. Stroke or blood clots. Damage to nearby areas of your body. Allergies to medicines or dyes. The need for a pacemaker if the normal system is damaged. Failure of the procedure to treat the problem. What happens before the procedure?  Medicines Ask your doctor about: Changing or stopping your normal medicines. This is important. Taking aspirin and ibuprofen. Do not take these medicines unless your doctor tells you to take them. Taking other medicines, vitamins, herbs, and supplements. General instructions Follow instructions from your doctor about  what you cannot eat or drink. Plan to have someone take you home from the hospital or clinic. If you will be going home right after the procedure, plan to have someone with you for 24 hours. Ask your doctor what steps will be taken to prevent infection. What happens during the procedure?  An IV tube will be put into one of your veins. You will be given a medicine to help you relax. The skin on your neck or groin will be numbed. A cut (incision) will be made in your neck or groin. A needle will be put through your cut and into a large vein. A tube (catheter) will be put into the needle. The tube will be moved to your heart. Dye may be put through the tube. This helps your doctor see your heart. Small devices (electrodes) on the tube will send out signals. A type of energy will be used to destroy some heart tissue. The tube will be taken out. Pressure will be held on your cut. This helps stop bleeding. A bandage will be put over your cut. The exact procedure may vary among doctors and hospitals. What happens after the procedure? You will be watched until you leave the hospital or clinic. This includes checking your heart rate, breathing rate, oxygen, and blood pressure. Your cut will be watched for bleeding. You will need to lie still for a few hours. Do not drive for 24 hours or as long as your doctor tells you. Summary Cardiac ablation is a procedure to destroy some heart tissue. This is done to treat heart rhythm problems. Tell your doctor about any medical conditions you may have. Tell him or her about all medicines you are taking to treat them. This is a safe procedure. But problems may occur. These include infection, bruising, bleeding, and damage to nearby areas of your body. Follow what your doctor tells you about food and drink. You may also be told to change or stop some of your medicines. After the procedure, do not drive for 24 hours or as long as your doctor tells you. This  information is not intended to replace advice given to you by your health care provider. Make sure you discuss any questions you have with your health care provider. Document Revised: 04/25/2021 Document Reviewed: 01/05/2019 Elsevier Patient Education  2023 [employer]   Cardiac Ablation, Care After  This sheet gives you information about how to care for yourself after your procedure. Your health care provider may also give you more specific instructions. If you have problems or questions, contact your health care provider. What can I expect after the procedure? After the procedure, it is common to have: Bruising around your puncture site. Tenderness around your puncture site. Skipped heartbeats. If you had an atrial fibrillation ablation, you may have atrial fibrillation during the first several months after your procedure.  Tiredness (fatigue).  Follow these instructions at home: Puncture site care  Follow instructions from your health care provider about how to take care of your puncture site. Make sure you: If present, leave stitches (sutures), skin glue, or adhesive strips in place. These skin closures may need to stay in place for up to 2 weeks. If adhesive strip  edges start to loosen and curl up, you may trim the loose edges. Do not remove adhesive strips completely unless your health care provider tells you to do that. If a large square bandage is present, this may be removed 24 hours after surgery.  Check your puncture site every day for signs of infection. Check for: Redness, swelling, or pain. Fluid or blood. If your puncture site starts to bleed, lie down on your back, apply firm pressure to the area, and contact your health care provider. Warmth. Pus or a bad smell. A pea or small marble sized lump at the site is normal and can take up to three months to resolve.  Driving Do not drive for at least 4 days after your procedure or however long your health care provider  recommends. (Do not resume driving if you have previously been instructed not to drive for other health reasons.) Do not drive or use heavy machinery while taking prescription pain medicine. Activity Avoid activities that take a lot of effort for at least 7 days after your procedure. Do not lift anything that is heavier than 5 lb (4.5 kg) for one week.  No sexual activity for 1 week.  Return to your normal activities as told by your health care provider. Ask your health care provider what activities are safe for you. General instructions Take over-the-counter and prescription medicines only as told by your health care provider. Do not use any products that contain nicotine or tobacco, such as cigarettes and e-cigarettes. If you need help quitting, ask your health care provider. You may shower after 24 hours, but Do not take baths, swim, or use a hot tub for 1 week.  Do not drink alcohol for 24 hours after your procedure. Keep all follow-up visits as told by your health care provider. This is important. Contact a health care provider if: You have redness, mild swelling, or pain around your puncture site. You have fluid or blood coming from your puncture site that stops after applying firm pressure to the area. Your puncture site feels warm to the touch. You have pus or a bad smell coming from your puncture site. You have a fever. You have chest pain or discomfort that spreads to your neck, jaw, or arm. You have chest pain that is worse with lying on your back or taking a deep breath. You are sweating a lot. You feel nauseous. You have a fast or irregular heartbeat. You have shortness of breath. You are dizzy or light-headed and feel the need to lie down. You have pain or numbness in the arm or leg closest to your puncture site. Get help right away if: Your puncture site suddenly swells. Your puncture site is bleeding and the bleeding does not stop after applying firm pressure to the  area. These symptoms may represent a serious problem that is an emergency. Do not wait to see if the symptoms will go away. Get medical help right away. Call your local emergency services (911 in the [place] Do not drive yourself to the hospital. Summary After the procedure, it is normal to have bruising and tenderness at the puncture site in your groin, neck, or forearm. Check your puncture site every day for signs of infection. Get help right away if your puncture site is bleeding and the bleeding does not stop after applying firm pressure to the area. This is a medical emergency. This information is not intended to replace advice given to you by your health care provider.  Make sure you discuss any questions you have with your health care provider.

## 2022-10-16 NOTE — Progress Notes (Signed)
Patient is here for cardioversion.  Patient appears to be in Sinus Bradycardia and verified with 12-lead EKG.  Dr. Shirlee Latch notified and procedure cancelled.

## 2022-10-16 NOTE — Progress Notes (Signed)
Electrophysiology Office Note:   Date:  10/16/2022  ID:  [name], DOB 11/28/1955, MRN [id number]  Primary Cardiologist: Lesleigh Noe, MD (Inactive) Electrophysiologist: Lewayne Bunting, MD      History of Present Illness:   [name] is a 67 y.o. male with h/o atrial fibrillation seen today for routine electrophysiology followup.  Since last being seen in our clinic the patient reports doing well.  He continues to have episodes of atrial fibrillation.  He presented for cardioversion this morning, but was in sinus rhythm.  He does feel poorly when he is in atrial fibrillation/flutter.  he denies chest pain, palpitations, dyspnea, PND, orthopnea, nausea, vomiting, dizziness, syncope, edema, weight gain, or early satiety.      He has a past history significant for alcohol abuse, cor triatriatum, right MCA stroke, hypertension, hyperlipidemia, atrial fibrillation.  His stroke was in 2016.  He had a loop monitor that showed atrial fibrillation and he was started on Xarelto.  TEE had an incidental finding of cor triatriatum dextram.   He was admitted to the hospital 05/09/2021 with respiratory distress.  He was intubated.  He was found to have atrial fibrillation and a wide-complex tachycardia.  He was started on amiodarone.  He represented to the hospital 05/25/2021 with recurrent respiratory distress.  He was found to be in atrial flutter.  Serum creatinine was increased.  Amiodarone was continued.       Review of systems complete and found to be negative unless listed in HPI.   EP Information / Studies Reviewed:    EKG is not ordered today. EKG from 10/16/22 reviewed which showed sinus rhythm        Risk Assessment/Calculations:    CHA2DS2-VASc Score = 3   This indicates a 3.2% annual risk of stroke. The patient's score is based upon: CHF History: 0 HTN History: 1 Diabetes History: 0 Stroke History: 0 Vascular Disease History: 1 Age Score: 1 Gender Score: 0              Physical Exam:   VS:  BP 98/60   Pulse 89   Ht 5\' 6"  (1.676 m)   Wt 150 lb (68 kg)   SpO2 96%   BMI 24.21 kg/m    Wt Readings from Last 3 Encounters:  10/16/22 150 lb (68 kg)  10/16/22 159 lb (72.1 kg)  09/24/22 153 lb 3.2 oz (69.5 kg)     GEN: Well nourished, well developed in no acute distress NECK: No JVD; No carotid bruits CARDIAC: Regular rate and rhythm, no murmurs, rubs, gallops RESPIRATORY:  Clear to auscultation without rales, wheezing or rhonchi  ABDOMEN: Soft, non-tender, non-distended EXTREMITIES:  No edema; No deformity   ASSESSMENT AND PLAN:    1.  Paroxysmal atrial fibrillation/flutter: Currently on amiodarone and Xarelto.  He is in sinus rhythm today.  He would benefit from ablation.  He feels quite poorly in atrial fibrillation.  Risks and benefits have been discussed.  He understands these risks and is agreed to the procedure.  [name] discuss this further with his primary cardiologist as well.  Risk, benefits, and alternatives to EP study and radiofrequency/pulse field ablation for afib were also discussed in detail today. These risks include but are not limited to stroke, bleeding, vascular damage, tamponade, perforation, damage to the esophagus, lungs, and other structures, pulmonary vein stenosis, worsening renal function, and death. The patient understands these risk and wishes to proceed.  We [name] therefore proceed with catheter ablation at  the next available time.  Carto, ICE, anesthesia are requested for the procedure.  [name] also obtain CT PV protocol prior to the procedure to exclude LAA thrombus and further evaluate atrial anatomy.  2.  Chronic diastolic heart failure: Plan per primary cardiology.  3.  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  None seen on cardiac MRI.  4.  Secondary hypercoagulable state: Currently on Xarelto for atrial fibrillation  Follow up with Dr. Elberta Fortis as usual post procedure  Signed, [name], MD

## 2022-10-20 ENCOUNTER — Other Ambulatory Visit: Payer: Self-pay | Admitting: Cardiology

## 2022-10-20 ENCOUNTER — Other Ambulatory Visit (HOSPITAL_COMMUNITY): Payer: Self-pay | Admitting: Cardiology

## 2022-10-20 ENCOUNTER — Other Ambulatory Visit (HOSPITAL_COMMUNITY): Payer: Self-pay

## 2022-10-21 ENCOUNTER — Other Ambulatory Visit (HOSPITAL_COMMUNITY): Payer: Self-pay

## 2022-10-21 MED ORDER — AMIODARONE HCL 200 MG PO TABS
100.0000 mg | ORAL_TABLET | Freq: Every day | ORAL | 3 refills | Status: DC
  Filled 2022-10-21: qty 45, 90d supply, fill #0
  Filled 2023-01-20: qty 45, 90d supply, fill #1
  Filled 2023-05-27: qty 45, 90d supply, fill #2

## 2022-10-22 ENCOUNTER — Other Ambulatory Visit (HOSPITAL_COMMUNITY): Payer: Self-pay

## 2022-10-22 ENCOUNTER — Other Ambulatory Visit: Payer: Self-pay

## 2022-10-22 MED ORDER — ROSUVASTATIN CALCIUM 40 MG PO TABS
40.0000 mg | ORAL_TABLET | Freq: Every day | ORAL | 1 refills | Status: DC
  Filled 2022-10-22: qty 30, 30d supply, fill #0
  Filled 2022-12-14: qty 30, 30d supply, fill #1

## 2022-11-11 ENCOUNTER — Other Ambulatory Visit (HOSPITAL_COMMUNITY): Payer: Self-pay

## 2022-11-11 ENCOUNTER — Other Ambulatory Visit: Payer: Self-pay

## 2022-11-19 ENCOUNTER — Other Ambulatory Visit: Payer: Self-pay | Admitting: *Deleted

## 2022-11-19 ENCOUNTER — Telehealth: Payer: Self-pay | Admitting: *Deleted

## 2022-11-19 DIAGNOSIS — I480 Paroxysmal atrial fibrillation: Secondary | ICD-10-CM

## 2022-11-19 NOTE — Telephone Encounter (Signed)
Spoke to dtr, dpr on file. Scheduled afib ablation for 10/30. Reviewed CT and ablation instructions verbally and sent via mychart. Aware I will send a message to the heart failure clinic to see if they will obtain BMET & CBC at his 10/15 OV w/ them.  Informed dtr that if they tell me they cannot I will call them to make other arrangements. Dtr verbalized understanding and agreeable to plan.

## 2022-11-19 NOTE — Telephone Encounter (Signed)
Orders added to upcoming appt notes

## 2022-11-19 NOTE — Progress Notes (Signed)
error 

## 2022-11-30 ENCOUNTER — Telehealth (HOSPITAL_COMMUNITY): Payer: Self-pay

## 2022-11-30 NOTE — Telephone Encounter (Signed)
Called and was unable to leave patient a voice message to confirm/remind patient of their appointment at the [facility] on 12/01/22.

## 2022-11-30 NOTE — Progress Notes (Signed)
Primary Care: Porfirio Oar, PA  Primary Cardiologist: Lesleigh Noe, MD (Inactive) HF Caridology: Dr. Shirlee Latch  67 y/o male w/ chronic diastolic heart failure, labile BP, tobacco use, HLD, CVA and atrial fibrillation was referred to CHF clinic from [facility] clinic.    Patient had cryptogenic stroke in 2016.  Placed on implantable loop recorder with finding of atrial fibrillation. Placed on Xarelto for anticoagulation. Echo then showed normal LVEF, 65-70%, normal RV. 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  He had 2 recent admissions in early 2023 for acute flash pulmonary edema/hypertensive urgency.   1st Admit 3/24-3/27/23: Presented w/ respiratory distress. Did not tolerate CPAP and placed on 15 L nonrebreather.  Intubated in the emergency room. Admitted for acute hypoxic respiratory failure in setting of flash pulmonary edema due to hypertensive urgency. He was also in Afib and felt to have possible PNA. Treated with broad-spectrum antibiotic.  Placed on heparin for elevated HS troponin 17>>873>>861>>731. Echo showed normal LVEF, 65%. TSH normal. UDS + for THC and Benzos.  Admission also notable for intermittent atrial flutter/fibrillation and development of a WCT that required emergent bedside defibrillation. Placed on IV amio and later transitioned to PO. LHC was done to r/o ischemic substrate. This showed mild, nonobstructive CAD. LVEDP was normal.  EP was also consulted for recommendations and recommend continuation of amio and ? blocker . His BP ultimately normalized and was actually running in low normal range with only little medications. Given this, renal artery stenosis was felt to be an unlikely culprit for pulmonary edema given normalization of pressures. He was discharged home on Metoprolol 50, amiodarone 200, Xarelto 20 and Crestor 20. Discharge home on 4/3.   Readmitted 05/25/21: presented w/ recurrent respiratory distress/ acute hypoxic respiratory failure in setting  of flash pulmonary edema and hypertensive urgency. BP was 165/126 and later 210/104. He was in atrial flutter. UDS + for THC only. Required BiPAP, IV Lasix, Nitropaste followed by IV nitroglycerin drip that was later tapered off. SCr had bumped from baseline of 1.03>>1.67. Diuretics later held. Repeat limited echo showed EF 55-60%, normal RV, mid-moderately thickened mitral and aortic valves but no MR nor AI. Similar to previous hospitalization, his BP normalized and returned to low normal range, limiting treatment w/ standing dose antihypertensive. His metoprolol was also later discontinued due to bradycardia. SCr returned to baseline. He was continued on PO amio and PO lasix restarted, 40 mg daily. Referred to [facility] clinic.   Cardiac MRI was done in 5/23 showing LV EF 58%, RV EF 56%, no myocardial LGE, no definite evidence for cor triatriatum dextrum but study may not have had the resolution to detect a thin wall in the RA, at least moderate MR.    TEE was done in 6/23, showing EF 55-60%, normal RV, prominent Eustachian valve vs incomplete cor triatriatum with no significant obstruction to flow, small PFO, mild bileaflet mitral valve prolapse with moderate MR (PISA ERO 0.24 cm^2).   Patient presents today for followup of CHF and atrial fibrillation.  He has been lost to followup for about a year.  Never got back in with EP for atrial fibrillation ablation, continues on low dose amiodarone.  He is in NSR today.  He denies palpitations. No chest pain.  No significant exertional dyspnea. No orthopnea/PND.  Weight up 2 lbs. He is still smoking 1/2 ppd.   ECG (personally reviewed): NSR, normal  Labs (4/23): K 4.4, creatinine 1.23 Labs (5/23): K 4.4, creatinine 1.2, TSH  normal, LDL 101, BNP 29, LFTs normal  PMH: 1. CVA: 2016, related to atrial fibrillation. Left arm weakness.  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  - Cardiac MRI in 5/23 showed LV EF 58%, RV EF 56%, no myocardial LGE, at least  moderate MR, no definite evidence for cor triatriatum in the RA but study may not have had the resolution to detect a thin wall in the RA.   3.  Atrial fibrillation: Paroxysmal.  4.  HTN - Renal artery dopplers (8/23): No evidence for renal artery stenosis.  5.  Diastolic CHF: Echo (4/23) with EF 55-60%, normal RV, IVC normal.  - LHC (3/23): luminal irregularities.  - Cardiac MRI (5/23): LV EF 58%, RV EF 56%, no myocardial LGE, at least moderate MR, no definite evidence for cor triatriatum in the RA but study may not have had the resolution to detect a thin wall in the RA.   - TEE (6/23): EF 55-60%, normal RV, prominent Eustachian valve vs incomplete cor triatriatum with no significant obstruction to flow, small PFO, mild bileaflet mitral valve prolapse with moderate MR (PISA ERO 0.24 cm^2). 6. Gout 7. Mitral valve prolapse: Moderate MR on TEE in 6/23.   Review of Systems: All systems reviewed and negative except as per HPI.  Current Outpatient Medications  Medication Sig Dispense Refill   amiodarone (PACERONE) 200 MG tablet Take 0.5 tablets (100 mg total) by mouth daily. 45 tablet 3   furosemide (LASIX) 40 MG tablet Take 1 tablet (40mg ) by mouth daily AND 1/2 tablet (20mg ) daily. 90 tablet 3   potassium chloride SA (KLOR-CON M) 20 MEQ tablet Take 1 tablet (20 mEq total) by mouth daily. 90 tablet 3   rivaroxaban (XARELTO) 20 MG TABS tablet Take 1 tablet (20 mg total) by mouth daily with supper. 30 tablet 3   rosuvastatin (CRESTOR) 40 MG tablet Take 1 tablet (40 mg total) by mouth at bedtime. 30 tablet 1   sacubitril-valsartan (ENTRESTO) 24-26 MG Take 1 tablet by mouth 2 (two) times daily. 60 tablet 11   No current facility-administered medications for this visit.    No Known Allergies    Social History   Socioeconomic History   Marital status: Widowed    Spouse name: Marylu Lund   Number of children: 3   Years of education: 9   Highest education level: 8th grade  Occupational History    Occupation: former Merchandiser, retail    Comment: disabled due to CVA  Tobacco Use   Smoking status: Every Day    Current packs/day: 2.50    Average packs/day: 2.5 packs/day for 40.0 years (100.0 ttl pk-yrs)    Types: Cigarettes   Smokeless tobacco: Never   Tobacco comments:    cut back after stroke, but increased again due to depression  Vaping Use   Vaping status: Never Used  Substance and Sexual Activity   Alcohol use: Not Currently   Drug use: Yes    Frequency: 14.0 times per week    Types: Marijuana    Comment: trying to help his appetite   Sexual activity: Yes    Partners: Female  Other Topics Concern   Not on file  Social History Narrative   Lives at home with wife, Marylu Lund   Caffeine use - tea 4-5 glasses a day   Social Determinants of Health   Financial Resource Strain: Low Risk  (10/05/2022)   Received from [facility]   Overall Financial Resource Strain (CARDIA)  Difficulty of Paying Living Expenses: Not hard at all  Food Insecurity: No Food Insecurity (10/05/2022)   Received from [facility]   Hunger Vital Sign    Worried About Running Out of Food in the Last Year: Never true    Ran Out of Food in the Last Year: Never true  Transportation Needs: No Transportation Needs (10/05/2022)   Received from [facility] - Transportation    Lack of Transportation (Medical): No    Lack of Transportation (Non-Medical): No  Physical Activity: Insufficiently Active (10/05/2022)   Received from [facility]   Exercise Vital Sign    Days of Exercise per Week: 3 days    Minutes of Exercise per Session: 20 min  Stress: No Stress Concern Present (10/05/2022)   Received from [facility] - Occupational Stress Questionnaire    Feeling of Stress : Not at all  Social Connections: Socially Integrated (10/05/2022)   Received from [facility]   Social Network    How would you rate your social network (family, work, friends)?:  Good participation with social networks  Intimate Partner Violence: Not At Risk (10/05/2022)   Received from [facility]   HITS    Over the last 12 months how often did your partner physically hurt you?: 1    Over the last 12 months how often did your partner insult you or talk down to you?: 1    Over the last 12 months how often did your partner threaten you with physical harm?: 1    Over the last 12 months how often did your partner scream or curse at you?: 1      Family History  Problem Relation Age of Onset   Cancer Father        leukemia   Heart attack Mother    Heart disease Brother    Heart attack Brother 40       CABG, stenting   Hypertension Brother    Alcohol abuse Brother     There were no vitals filed for this visit.   PHYSICAL EXAM: General: NAD Neck: No JVD, no thyromegaly or thyroid nodule.  Lungs: Clear to auscultation bilaterally with normal respiratory effort. CV: Nondisplaced PMI.  Heart regular S1/S2, no S3/S4, 1/6 HSM apex.  No peripheral edema.  No carotid bruit.  Normal pedal pulses.  Abdomen: Soft, nontender, no hepatosplenomegaly, no distention.  Skin: Intact without lesions or rashes.  Neurologic: Alert and oriented x 3.  Psych: Normal affect. Extremities: No clubbing or cyanosis.  HEENT: Normal.   ASSESSMENT & PLAN: 1.  Chronic diastolic CHF:  Echo 4/23, EF ~60%, moderate hypertrophy of the basal septal segment. The rest of the LV segments demonstrate mild concentric left ventricular hypertrophy, RV normal, thickened mitral and aortic valves, mod LAE.  He has had admits for acute CHF w/ flash pulmonary edema/ hypertensive urgency. LHC (3/23) showed no significant CAD.  UDS negative for cocaine. BP has been labile and not persistently high.  Notably, episodes were associated with atrial fibrillation.  Cardiac MRI in 5/23 was not suggestive of infiltrative disease (no LGE) or hypertrophic cardiomyopathy but did suggest at least moderate MR (this was  not noted on echoes).  TEE in 6/23 showed EF 55-60%, normal RV, prominent Eustachian valve vs incomplete cor triatriatum with no significant obstruction to flow, small PFO, mild bileaflet mitral valve prolapse with moderate MR (PISA ERO 0.24 cm^2).  No recent atrial fibrillation episodes with  pulmonary edema. Today, he is not volume overloaded on exam.  NYHA class I symptoms.    - Need to maintain NSR, tolerates AF poorly (see below).  - Continue Entresto 24/26 bid, BMET/BNP today.   - He can continue Lasix 40 daily alternating with 20 daily.   - Continue spironolactone 12.5 daily.   2. Atrial fibrillation/flutter: NSR by exam today.  AF/AFL episodes associated with decompensated CHF.  Would like to maintain in NSR.  - Continue amiodarone 100 mg daily.  Check LFTs and TSH.  Will need regular eye exam.  - Patient has a prominent Eustachian valve vs incomplete cor triatriatum, but there is no significant obstruction to flow in the RA and I think that atrial fibrillation ablation would be feasible.  He never went back to see Dr. Elberta Fortis to get set up for ablation (would allow [place] to stop amiodarone hopefully) => I will refer back to Dr. Elberta Fortis today.  3. HTN: BP controlled.  Renal artery dopplers in 8/23 were normal.  4. Cor Triatriatum of RA: On TEE in 6/23, patient has a prominent Eustachian valve vs incomplete cor triatriatum, but there is no significant obstruction to flow in the RA and I think that atrial fibrillation ablation would be feasible. 5. Mitral regurgitation: Mitral valve prolapse with moderate MR on TEE in 6/23.   - I will repeat an echo to assess for MR.  6. Smoking: I strongly urged him to quit.   Followup 4 months with APP.   Anderson Malta [facility] 11/30/2022

## 2022-12-01 ENCOUNTER — Encounter (HOSPITAL_COMMUNITY): Payer: Self-pay

## 2022-12-01 ENCOUNTER — Ambulatory Visit (HOSPITAL_COMMUNITY)
Admission: RE | Admit: 2022-12-01 | Discharge: 2022-12-01 | Disposition: A | Discharge status: Home / Self Care | Payer: Medicare Other | Source: Ambulatory Visit | Attending: Family Medicine

## 2022-12-01 ENCOUNTER — Other Ambulatory Visit (HOSPITAL_COMMUNITY): Payer: Self-pay

## 2022-12-01 VITALS — BP 110/80 | HR 106 | Wt 151.0 lb

## 2022-12-01 DIAGNOSIS — I5032 Chronic diastolic (congestive) heart failure: Secondary | ICD-10-CM | POA: Diagnosis present

## 2022-12-01 DIAGNOSIS — I4892 Unspecified atrial flutter: Secondary | ICD-10-CM | POA: Insufficient documentation

## 2022-12-01 DIAGNOSIS — F17200 Nicotine dependence, unspecified, uncomplicated: Secondary | ICD-10-CM

## 2022-12-01 DIAGNOSIS — I480 Paroxysmal atrial fibrillation: Secondary | ICD-10-CM | POA: Diagnosis not present

## 2022-12-01 DIAGNOSIS — I10 Essential (primary) hypertension: Secondary | ICD-10-CM

## 2022-12-01 DIAGNOSIS — Z79899 Other long term (current) drug therapy: Secondary | ICD-10-CM | POA: Insufficient documentation

## 2022-12-01 DIAGNOSIS — I340 Nonrheumatic mitral (valve) insufficiency: Secondary | ICD-10-CM | POA: Diagnosis not present

## 2022-12-01 DIAGNOSIS — F17210 Nicotine dependence, cigarettes, uncomplicated: Secondary | ICD-10-CM | POA: Diagnosis not present

## 2022-12-01 DIAGNOSIS — E785 Hyperlipidemia, unspecified: Secondary | ICD-10-CM | POA: Diagnosis present

## 2022-12-01 DIAGNOSIS — I110 Hypertensive heart disease with heart failure: Secondary | ICD-10-CM | POA: Insufficient documentation

## 2022-12-01 DIAGNOSIS — Q242 Cor triatriatum: Secondary | ICD-10-CM

## 2022-12-01 DIAGNOSIS — Z8673 Personal history of transient ischemic attack (TIA), and cerebral infarction without residual deficits: Secondary | ICD-10-CM | POA: Diagnosis present

## 2022-12-01 LAB — COMPREHENSIVE METABOLIC PANEL
ALT: 14 U/L (ref 0–44)
AST: 23 U/L (ref 15–41)
Albumin: 4 g/dL (ref 3.5–5.0)
Alkaline Phosphatase: 70 U/L (ref 38–126)
Anion gap: 10 (ref 5–15)
BUN: 8 mg/dL (ref 8–23)
CO2: 26 mmol/L (ref 22–32)
Calcium: 9.7 mg/dL (ref 8.9–10.3)
Chloride: 102 mmol/L (ref 98–111)
Creatinine, Ser: 1.16 mg/dL (ref 0.61–1.24)
GFR, Estimated: >60 mL/min (ref >60)
Glucose, Bld: 87 mg/dL (ref 70–99)
Potassium: 4 mmol/L (ref 3.5–5.1)
Sodium: 138 mmol/L (ref 135–145)
Total Bilirubin: 1 mg/dL (ref 0.3–1.2)
Total Protein: 8 g/dL (ref 6.5–8.1)

## 2022-12-01 LAB — BRAIN NATRIURETIC PEPTIDE: B Natriuretic Peptide: 216.9 pg/mL — ABNORMAL HIGH (ref 0.0–100.0)

## 2022-12-01 NOTE — Progress Notes (Signed)
Pt reports high cost of Xarelto at this time. Samples provided. Contacted our PharmD for help.   Pt updated per Charlett Nose, CPhT with following:  "He's on a Medicare plan - if he's in the donut hole then they have a program that would cap the cost at 89$ per month, but they're having the pts apply online now. He can go to [email or web address] and apply for the Xarelto With Me program".  Written information given to patient; he verbalized understanding of above.

## 2022-12-01 NOTE — Patient Instructions (Addendum)
No change in medications. Zarelto samples provided: LN 23MG 470; exp 9/26; # 14 Labs drawn today - will call you if abnormal. Return to see Dr. Shirlee Latch in 6 months. Please call us at [phone] in March to schedule this appointment. Please call us at [phone] if any questions or concerns prior to your next visit.

## 2022-12-01 NOTE — Addendum Note (Signed)
Encounter addended by: Levonne Spiller, RN on: 12/01/2022 10:33 AM  Actions taken: Clinical Note Signed

## 2022-12-02 ENCOUNTER — Telehealth (HOSPITAL_COMMUNITY): Payer: Self-pay | Admitting: *Deleted

## 2022-12-02 NOTE — Telephone Encounter (Signed)
Returning patient's daughter's call about the patient's upcoming cardiac imaging study; pt's daughter verbalizes understanding of appt date/time, parking situation and where to check in, pre-test NPO status, and verified current allergies; name and call back number provided for further questions should they arise  Larey Brick RN Navigator Cardiac Imaging [facility] [phone] office [phone] cell  Patient's daughter is aware he is to arrive at 8:30 AM.

## 2022-12-02 NOTE — Telephone Encounter (Signed)
Attempted to call patient regarding upcoming cardiac CT appointment. Voicemail box full.  Larey Brick RN Navigator Cardiac Imaging [facility] [phone] Office [phone] Cell

## 2022-12-03 ENCOUNTER — Ambulatory Visit (HOSPITAL_COMMUNITY)
Admission: RE | Admit: 2022-12-03 | Discharge: 2022-12-03 | Disposition: A | Discharge status: Home / Self Care | Payer: Medicare Other | Source: Ambulatory Visit | Attending: Cardiology | Admitting: Cardiology

## 2022-12-03 DIAGNOSIS — I480 Paroxysmal atrial fibrillation: Secondary | ICD-10-CM | POA: Insufficient documentation

## 2022-12-03 MED ORDER — IOHEXOL 350 MG/ML SOLN
95.0000 mL | Freq: Once | INTRAVENOUS | Status: AC | PRN
  Administered 2022-12-03: 95 mL via INTRAVENOUS

## 2022-12-15 NOTE — Pre-Procedure Instructions (Signed)
Attempted to call patient regarding procedure instructions.  Left voicemail on the following items: Arrival time 0600 Nothing to eat or drink after midnight No meds AM of procedure Responsible person to drive you home and stay with you for 24 hrs  Have you missed any doses of anti-coagulant Xarelto- should be taken once a day, if you have missed any doses please let us know.  Don't take dose in the morning.

## 2022-12-15 NOTE — Anesthesia Preprocedure Evaluation (Signed)
Anesthesia Evaluation  Patient identified by MRN, date of birth, ID band Patient awake    Reviewed: Allergy & Precautions, NPO status , Patient's Chart, lab work & pertinent test results  History of Anesthesia Complications Negative for: history of anesthetic complications  Airway Mallampati: I  TM Distance: >3 FB Neck ROM: Full    Dental  (+) Edentulous Upper, Edentulous Lower   Pulmonary Current SmokerPatient did not abstain from smoking.   breath sounds clear to auscultation       Cardiovascular hypertension, Pt. on medications +CHF (Entresto)  + dysrhythmias Atrial Fibrillation  Rhythm:Irregular Rate:Normal  09/2022 ECHO: EF 55-60%, normal LVF, normal RVF, mild-mod MR   Neuro/Psych   Anxiety Depression    CVA    GI/Hepatic negative GI ROS,,,(+)     substance abuse  alcohol use and marijuana use  Endo/Other  negative endocrine ROS    Renal/GU Renal InsufficiencyRenal disease     Musculoskeletal  (+) Arthritis ,    Abdominal   Peds  Hematology xarelto   Anesthesia Other Findings   Reproductive/Obstetrics                             Anesthesia Physical Anesthesia Plan  ASA: 3  Anesthesia Plan: General   Post-op Pain Management: Tylenol PO (pre-op)*   Induction: Intravenous  PONV Risk Score and Plan: 1 and Ondansetron and Dexamethasone  Airway Management Planned: Oral ETT  Additional Equipment: None  Intra-op Plan:   Post-operative Plan: Extubation in OR  Informed Consent: I have reviewed the patients History and Physical, chart, labs and discussed the procedure including the risks, benefits and alternatives for the proposed anesthesia with the patient or authorized representative who has indicated his/her understanding and acceptance.       Plan Discussed with: CRNA and Surgeon  Anesthesia Plan Comments:        Anesthesia Quick Evaluation

## 2022-12-16 ENCOUNTER — Encounter (HOSPITAL_COMMUNITY)
Admission: RE | Disposition: A | Discharge status: Home / Self Care | Payer: Self-pay | Source: Home / Self Care | Attending: Cardiology

## 2022-12-16 ENCOUNTER — Ambulatory Visit (HOSPITAL_BASED_OUTPATIENT_CLINIC_OR_DEPARTMENT_OTHER): Payer: Medicare Other | Admitting: Anesthesiology

## 2022-12-16 ENCOUNTER — Other Ambulatory Visit: Payer: Self-pay

## 2022-12-16 ENCOUNTER — Ambulatory Visit (HOSPITAL_COMMUNITY)
Admission: RE | Admit: 2022-12-16 | Discharge: 2022-12-16 | Disposition: A | Discharge status: Home / Self Care | Payer: Medicare Other | Attending: Cardiology | Admitting: Cardiology

## 2022-12-16 ENCOUNTER — Ambulatory Visit (HOSPITAL_COMMUNITY): Payer: Medicare Other | Admitting: Anesthesiology

## 2022-12-16 DIAGNOSIS — I4891 Unspecified atrial fibrillation: Secondary | ICD-10-CM

## 2022-12-16 DIAGNOSIS — F17210 Nicotine dependence, cigarettes, uncomplicated: Secondary | ICD-10-CM

## 2022-12-16 DIAGNOSIS — I4819 Other persistent atrial fibrillation: Secondary | ICD-10-CM | POA: Diagnosis present

## 2022-12-16 DIAGNOSIS — E785 Hyperlipidemia, unspecified: Secondary | ICD-10-CM | POA: Diagnosis not present

## 2022-12-16 DIAGNOSIS — I480 Paroxysmal atrial fibrillation: Secondary | ICD-10-CM

## 2022-12-16 DIAGNOSIS — Z8673 Personal history of transient ischemic attack (TIA), and cerebral infarction without residual deficits: Secondary | ICD-10-CM | POA: Diagnosis not present

## 2022-12-16 DIAGNOSIS — I509 Heart failure, unspecified: Secondary | ICD-10-CM | POA: Diagnosis not present

## 2022-12-16 DIAGNOSIS — Z7901 Long term (current) use of anticoagulants: Secondary | ICD-10-CM | POA: Insufficient documentation

## 2022-12-16 DIAGNOSIS — I4892 Unspecified atrial flutter: Secondary | ICD-10-CM | POA: Diagnosis not present

## 2022-12-16 DIAGNOSIS — I110 Hypertensive heart disease with heart failure: Secondary | ICD-10-CM | POA: Insufficient documentation

## 2022-12-16 HISTORY — PX: ATRIAL FIBRILLATION ABLATION: EP1191

## 2022-12-16 LAB — CBC
HCT: 42.9 % (ref 39.0–52.0)
Hemoglobin: 14.3 g/dL (ref 13.0–17.0)
MCH: 31.9 pg (ref 26.0–34.0)
MCHC: 33.3 g/dL (ref 30.0–36.0)
MCV: 95.8 fL (ref 80.0–100.0)
Platelets: 244 10*3/uL (ref 150–400)
RBC: 4.48 MIL/uL (ref 4.22–5.81)
RDW: 13.1 % (ref 11.5–15.5)
WBC: 8.8 10*3/uL (ref 4.0–10.5)
nRBC: 0 % (ref 0.0–0.2)

## 2022-12-16 LAB — POCT ACTIVATED CLOTTING TIME
Activated Clotting Time: 389 s
Activated Clotting Time: 389 s

## 2022-12-16 SURGERY — ATRIAL FIBRILLATION ABLATION
Anesthesia: General

## 2022-12-16 MED ORDER — ONDANSETRON HCL 4 MG/2ML IJ SOLN: 4.0000 mg | Freq: Four times a day (QID) | INTRAMUSCULAR | Status: DC | PRN

## 2022-12-16 MED ORDER — PROPOFOL 10 MG/ML IV BOLUS
INTRAVENOUS | Status: DC | PRN
  Administered 2022-12-16: 100 mg via INTRAVENOUS
  Administered 2022-12-16 (×2): 50 mg via INTRAVENOUS

## 2022-12-16 MED ORDER — PROPOFOL 500 MG/50ML IV EMUL
INTRAVENOUS | Status: DC | PRN
Start: 2022-12-16 — End: 2022-12-16
  Administered 2022-12-16: 55 ug/kg/min via INTRAVENOUS

## 2022-12-16 MED ORDER — HEPARIN SODIUM (PORCINE) 1000 UNIT/ML IJ SOLN
INTRAMUSCULAR | Status: AC
  Filled 2022-12-16: qty 10

## 2022-12-16 MED ORDER — DEXMEDETOMIDINE HCL IN NACL 80 MCG/20ML IV SOLN
INTRAVENOUS | Status: DC | PRN
  Administered 2022-12-16: 12 ug via INTRAVENOUS

## 2022-12-16 MED ORDER — LIDOCAINE 2% (20 MG/ML) 5 ML SYRINGE
INTRAMUSCULAR | Status: DC | PRN
  Administered 2022-12-16: 60 mg via INTRAVENOUS

## 2022-12-16 MED ORDER — SODIUM CHLORIDE 0.9 % IV SOLN: INTRAVENOUS | Status: DC

## 2022-12-16 MED ORDER — ACETAMINOPHEN 325 MG PO TABS: 650.0000 mg | ORAL_TABLET | ORAL | Status: DC | PRN

## 2022-12-16 MED ORDER — PROTAMINE SULFATE 10 MG/ML IV SOLN
INTRAVENOUS | Status: DC | PRN
  Administered 2022-12-16: 20 mg via INTRAVENOUS
  Administered 2022-12-16: 40 mg via INTRAVENOUS

## 2022-12-16 MED ORDER — SUGAMMADEX SODIUM 200 MG/2ML IV SOLN
INTRAVENOUS | Status: DC | PRN
  Administered 2022-12-16: 200 mg via INTRAVENOUS

## 2022-12-16 MED ORDER — HEPARIN (PORCINE) IN NACL 1000-0.9 UT/500ML-% IV SOLN
INTRAVENOUS | Status: DC | PRN
  Administered 2022-12-16 (×3): 500 mL

## 2022-12-16 MED ORDER — DEXAMETHASONE SODIUM PHOSPHATE 10 MG/ML IJ SOLN
INTRAMUSCULAR | Status: DC | PRN
  Administered 2022-12-16: 10 mg via INTRAVENOUS

## 2022-12-16 MED ORDER — HEPARIN SODIUM (PORCINE) 1000 UNIT/ML IJ SOLN
INTRAMUSCULAR | Status: DC | PRN
  Administered 2022-12-16: 14000 [IU] via INTRAVENOUS

## 2022-12-16 MED ORDER — ATROPINE SULFATE 1 MG/10ML IJ SOSY
PREFILLED_SYRINGE | INTRAMUSCULAR | Status: AC
  Filled 2022-12-16: qty 10

## 2022-12-16 MED ORDER — ONDANSETRON HCL 4 MG/2ML IJ SOLN
INTRAMUSCULAR | Status: DC | PRN
  Administered 2022-12-16: 4 mg via INTRAVENOUS

## 2022-12-16 MED ORDER — SODIUM CHLORIDE 0.9 % IV SOLN: 250.0000 mL | INTRAVENOUS | Status: DC | PRN

## 2022-12-16 MED ORDER — ROCURONIUM BROMIDE 10 MG/ML (PF) SYRINGE
PREFILLED_SYRINGE | INTRAVENOUS | Status: DC | PRN
  Administered 2022-12-16: 20 mg via INTRAVENOUS
  Administered 2022-12-16: 60 mg via INTRAVENOUS
  Administered 2022-12-16: 20 mg via INTRAVENOUS

## 2022-12-16 MED ORDER — FENTANYL CITRATE (PF) 250 MCG/5ML IJ SOLN
INTRAMUSCULAR | Status: DC | PRN
  Administered 2022-12-16 (×2): 50 ug via INTRAVENOUS

## 2022-12-16 MED ORDER — ATROPINE SULFATE 1 MG/ML IV SOLN
INTRAVENOUS | Status: DC | PRN
  Administered 2022-12-16: 1 mg via INTRAVENOUS

## 2022-12-16 MED ORDER — SODIUM CHLORIDE 0.9% FLUSH: 3.0000 mL | INTRAVENOUS | Status: DC | PRN

## 2022-12-16 MED ORDER — MIDAZOLAM HCL 2 MG/2ML IJ SOLN
INTRAMUSCULAR | Status: DC | PRN
  Administered 2022-12-16: 2 mg via INTRAVENOUS

## 2022-12-16 SURGICAL SUPPLY — 23 items
BAG SNAP BAND KOVER 36X36 (MISCELLANEOUS) IMPLANT
BLANKET WARM UNDERBOD FULL ACC (MISCELLANEOUS) ×1 IMPLANT
CABLE PFA RX CATH CONN (CABLE) IMPLANT
CATH EZ STEER NAV 8MM D-F CUR (ABLATOR) IMPLANT
CATH FARAWAVE ABLATION 31 (CATHETERS) IMPLANT
CATH OCTARAY 2.0 F 3-3-3-3-3 (CATHETERS) IMPLANT
CATH SOUNDSTAR ECO 8FR (CATHETERS) IMPLANT
CATH WEBSTER BI DIR CS D-F CRV (CATHETERS) IMPLANT
CLOSURE MYNX CONTROL 6F/7F (Vascular Products) IMPLANT
CLOSURE PERCLOSE PROSTYLE (VASCULAR PRODUCTS) IMPLANT
COVER SWIFTLINK CONNECTOR (BAG) ×1 IMPLANT
DILATOR VESSEL 38 20CM 16FR (INTRODUCER) IMPLANT
GUIDEWIRE INQWIRE 1.5J.035X260 (WIRE) IMPLANT
INQWIRE 1.5J .035X260CM (WIRE) ×1
PACK EP LATEX FREE (CUSTOM PROCEDURE TRAY) ×1
PACK EP LF (CUSTOM PROCEDURE TRAY) ×1 IMPLANT
PAD DEFIB RADIO PHYSIO CONN (PAD) ×1 IMPLANT
PATCH CARTO3 (PAD) IMPLANT
SHEATH FARADRIVE STEERABLE (SHEATH) IMPLANT
SHEATH PINNACLE 8F 10CM (SHEATH) IMPLANT
SHEATH PINNACLE 9F 10CM (SHEATH) IMPLANT
SHEATH PROBE COVER 6X72 (BAG) IMPLANT
SHEATH WIRE KIT BAYLIS SL1 (KITS) IMPLANT

## 2022-12-16 NOTE — Anesthesia Postprocedure Evaluation (Signed)
Anesthesia Post Note  Patient: [name]  Procedure(s) Performed: ATRIAL FIBRILLATION ABLATION     Patient location during evaluation: Phase II Anesthesia Type: General Level of consciousness: patient cooperative, oriented and sedated Pain management: pain level controlled Vital Signs Assessment: post-procedure vital signs reviewed and stable Respiratory status: spontaneous breathing, nonlabored ventilation and respiratory function stable Cardiovascular status: blood pressure returned to baseline and stable Postop Assessment: no apparent nausea or vomiting and adequate PO intake Anesthetic complications: no   There were no known notable events for this encounter.  Last Vitals:  Vitals:   12/16/22 1230 12/16/22 1300  BP: (!) 145/84 (!) 152/88  Pulse: 60 64  Resp: 15 (!) 21  Temp:    SpO2: 96% 94%    Last Pain:  Vitals:   12/16/22 1100  TempSrc: Temporal  PainSc:                  Burl Tauzin,E. Antonina Deziel

## 2022-12-16 NOTE — Anesthesia Procedure Notes (Addendum)
Procedure Name: Intubation Date/Time: 12/16/2022 8:59 AM  Performed by: Vena Austria, CRNAPre-anesthesia Checklist: Patient identified, Emergency Drugs available, Suction available, Patient being monitored and Timeout performed Patient Re-evaluated:Patient Re-evaluated prior to induction Oxygen Delivery Method: Circle system utilized Preoxygenation: Pre-oxygenation with 100% oxygen Induction Type: IV induction Ventilation: Mask ventilation without difficulty Grade View: Grade II Tube type: Oral Tube size: 7.0 mm Airway Equipment and Method: Stylet Placement Confirmation: ETT inserted through vocal cords under direct vision, positive ETCO2, CO2 detector and breath sounds checked- equal and bilateral Secured at: 23 cm Tube secured with: Tape Dental Injury: Teeth and Oropharynx as per pre-operative assessment  Comments: Not good view with Miller 2-intubated using Video King blade #3 blade

## 2022-12-16 NOTE — H&P (Signed)
  Electrophysiology Office Note:   Date:  12/16/2022  ID:  [name], DOB [birth year], MRN [id number]  Primary Cardiologist: Lesleigh Noe, MD (Inactive) Electrophysiologist: Lewayne Bunting, MD      History of Present Illness:   [name] is a 67 y.o. male with h/o atrial fibrillation seen today for routine electrophysiology followup.  Since last being seen in our clinic the patient reports doing well.  He continues to have episodes of atrial fibrillation.  He presented for cardioversion this morning, but was in sinus rhythm.  He does feel poorly when he is in atrial fibrillation/flutter.  he denies chest pain, palpitations, dyspnea, PND, orthopnea, nausea, vomiting, dizziness, syncope, edema, weight gain, or early satiety.      He has a past history significant for alcohol abuse, cor triatriatum, right MCA stroke, hypertension, hyperlipidemia, atrial fibrillation.  His stroke was in 2016.  He had a loop monitor that showed atrial fibrillation and he was started on Xarelto.  TEE had an incidental finding of cor triatriatum dextram.   Today, denies symptoms of palpitations, chest pain, shortness of breath, orthopnea, PND, lower extremity edema, claudication, dizziness, presyncope, syncope, bleeding, or neurologic sequela. The patient is tolerating medications without difficulties. Plan AF ablation today.       Review of systems complete and found to be negative unless listed in HPI.   EP Information / Studies Reviewed:    EKG is not ordered today. EKG from 10/16/22 reviewed which showed sinus rhythm        Risk Assessment/Calculations:    CHA2DS2-VASc Score = 3   This indicates a 3.2% annual risk of stroke. The patient's score is based upon: CHF History: 0 HTN History: 1 Diabetes History: 0 Stroke History: 0 Vascular Disease History: 1 Age Score: 1 Gender Score: 0             Physical Exam:   VS:  BP 138/65   Pulse 60   Temp (!) 97.5 F (36.4 C) (Oral)   Resp  18   Ht 5\' 6"  (1.676 m)   Wt 72.6 kg   SpO2 95%   BMI 25.82 kg/m    Wt Readings from Last 3 Encounters:  12/16/22 72.6 kg  12/01/22 68.5 kg  10/16/22 68 kg    GEN: No acute distress.   Neck: No JVD Cardiac: RRR, no murmurs, rubs, or gallops.  Respiratory: decreased BS bases bilaterally. GI: Soft, nontender, non-distended  MS: No edema; No deformity. Neuro:  Nonfocal  Skin: warm and dry Psych: Normal affect    ASSESSMENT AND PLAN:    1.  Paroxysmal atrial fibrillation/flutter: [name] has presented today for surgery, with the diagnosis of AF.  The various methods of treatment have been discussed with the patient and family. After consideration of risks, benefits and other options for treatment, the patient has consented to  Procedure(s): Catheter ablation as a surgical intervention .  Risks include but not limited to complete heart block, stroke, esophageal damage, nerve damage, bleeding, vascular damage, tamponade, perforation, MI, and death. The patient's history has been reviewed, patient examined, no change in status, stable for surgery.  I have reviewed the patient's chart and labs.  Questions were answered to the patient's satisfaction.    Erik Nessel Elberta Fortis, MD 12/16/2022 6:54 AM

## 2022-12-16 NOTE — Transfer of Care (Signed)
Immediate Anesthesia Transfer of Care Note  Patient: [name]  Procedure(s) Performed: ATRIAL FIBRILLATION ABLATION  Patient Location: PACU and Cath Lab  Anesthesia Type:General  Level of Consciousness: awake  Airway & Oxygen Therapy: Patient connected to nasal cannula oxygen  Post-op Assessment: Report given to RN  Post vital signs: stable  Last Vitals:  Vitals Value Taken Time  BP    Temp    Pulse    Resp    SpO2      Last Pain:  Vitals:   12/16/22 0631  TempSrc: Oral  PainSc: 0-No pain         Complications: There were no known notable events for this encounter.

## 2022-12-16 NOTE — Discharge Instructions (Signed)

## 2022-12-17 ENCOUNTER — Encounter (HOSPITAL_COMMUNITY): Payer: Self-pay | Admitting: Cardiology

## 2023-01-13 ENCOUNTER — Other Ambulatory Visit (HOSPITAL_COMMUNITY): Payer: Self-pay | Admitting: Cardiology

## 2023-01-13 ENCOUNTER — Encounter (HOSPITAL_COMMUNITY): Payer: Self-pay | Admitting: Internal Medicine

## 2023-01-13 ENCOUNTER — Other Ambulatory Visit (HOSPITAL_COMMUNITY): Payer: Self-pay

## 2023-01-13 ENCOUNTER — Ambulatory Visit (HOSPITAL_COMMUNITY)
Admission: RE | Admit: 2023-01-13 | Discharge: 2023-01-13 | Disposition: A | Discharge status: Home / Self Care | Payer: Medicare Other | Source: Ambulatory Visit | Attending: Internal Medicine | Admitting: Internal Medicine

## 2023-01-13 VITALS — BP 138/84 | HR 54 | Ht 66.0 in | Wt 151.0 lb

## 2023-01-13 DIAGNOSIS — I480 Paroxysmal atrial fibrillation: Secondary | ICD-10-CM

## 2023-01-13 DIAGNOSIS — Z7901 Long term (current) use of anticoagulants: Secondary | ICD-10-CM | POA: Insufficient documentation

## 2023-01-13 DIAGNOSIS — Z79899 Other long term (current) drug therapy: Secondary | ICD-10-CM | POA: Insufficient documentation

## 2023-01-13 DIAGNOSIS — I2510 Atherosclerotic heart disease of native coronary artery without angina pectoris: Secondary | ICD-10-CM | POA: Insufficient documentation

## 2023-01-13 DIAGNOSIS — I10 Essential (primary) hypertension: Secondary | ICD-10-CM | POA: Diagnosis not present

## 2023-01-13 DIAGNOSIS — E785 Hyperlipidemia, unspecified: Secondary | ICD-10-CM | POA: Insufficient documentation

## 2023-01-13 DIAGNOSIS — Z8673 Personal history of transient ischemic attack (TIA), and cerebral infarction without residual deficits: Secondary | ICD-10-CM | POA: Diagnosis not present

## 2023-01-13 DIAGNOSIS — D6869 Other thrombophilia: Secondary | ICD-10-CM | POA: Insufficient documentation

## 2023-01-13 MED ORDER — RIVAROXABAN 20 MG PO TABS
20.0000 mg | ORAL_TABLET | Freq: Every day | ORAL | 3 refills | Status: DC
  Filled 2023-01-13: qty 30, 30d supply, fill #0
  Filled 2023-02-23: qty 30, 30d supply, fill #1

## 2023-01-13 NOTE — Progress Notes (Signed)
Primary Care Physician: Porfirio Oar, PA Primary Cardiologist: Lesleigh Noe, MD (Inactive) Electrophysiologist: Lewayne Bunting, MD     Referring Physician: Dr. Katy Fitch is a 67 y.o. male with a history of alcohol abuse, CAD (CAC score 486), cortriatriatum, right MCA stroke in 2016, HTN, HLD, and paroxysmal atrial fibrillation/flutter who presents for consultation in the [facility]. S/p Afib ablation on 12/16/22 by Dr. Elberta Fortis. He is currently taking amiodarone 100 mg daily. Patient is on Xarelto 20 mg daily for a CHADS2VASC score of 5.  On evaluation today, he is currently in NSR. No episodes of Afib since ablation. No chest pain, SOB, or trouble swallowing. Leg sites healed without issue. No missed doses of anticoagulant.  Today, he denies symptoms of orthopnea, PND, lower extremity edema, dizziness, presyncope, syncope, snoring, daytime somnolence, bleeding, or neurologic sequela. The patient is tolerating medications without difficulties and is otherwise without complaint today.   he has a BMI of Body mass index is 24.37 kg/m.Marland Kitchen Filed Weights   01/13/23 1104  Weight: 68.5 kg    Current Outpatient Medications  Medication Sig Dispense Refill   amiodarone (PACERONE) 200 MG tablet Take 0.5 tablets (100 mg total) by mouth daily. 45 tablet 3   furosemide (LASIX) 40 MG tablet Take 1 tablet (40mg ) by mouth daily AND 1/2 tablet (20mg ) daily. (Patient taking differently: Take 20-40 mg by mouth See admin instructions. Alternate taking 40 mg one day and 20 mg the next day) 90 tablet 3   potassium chloride SA (KLOR-CON M) 20 MEQ tablet Take 1 tablet (20 mEq total) by mouth daily. 90 tablet 3   rivaroxaban (XARELTO) 20 MG TABS tablet Take 1 tablet (20 mg total) by mouth daily with supper. 30 tablet 3   rosuvastatin (CRESTOR) 40 MG tablet Take 1 tablet (40 mg total) by mouth at bedtime. 30 tablet 1   sacubitril-valsartan (ENTRESTO) 24-26 MG  Take 1 tablet by mouth 2 (two) times daily. 60 tablet 11   No current facility-administered medications for this encounter.    Atrial Fibrillation Management history:  Previous antiarrhythmic drugs: amiodarone Previous cardioversions: none Previous ablations: 12/16/22 Anticoagulation history: Xarelto   ROS- All systems are reviewed and negative except as per the HPI above.  Physical Exam: BP 138/84   Pulse (!) 54   Ht 5\' 6"  (1.676 m)   Wt 68.5 kg   BMI 24.37 kg/m   GEN: Well nourished, well developed in no acute distress NECK: No JVD; No carotid bruits CARDIAC: Regular rate and rhythm, no murmurs, rubs, gallops RESPIRATORY:  Clear to auscultation without rales, wheezing or rhonchi  ABDOMEN: Soft, non-tender, non-distended EXTREMITIES:  No edema; No deformity   EKG today demonstrates  Vent. rate 54 BPM PR interval 164 ms QRS duration 96 ms QT/QTcB 430/407 ms P-R-T axes 63 73 71 Sinus bradycardia Septal infarct , age undetermined Abnormal ECG When compared with ECG of 16-Dec-2022 10:54, PREVIOUS ECG IS PRESENT  Echo 09/17/22 demonstrated   1. Left ventricular ejection fraction, by estimation, is 55 to 60%. The  left ventricle has normal function. The left ventricle has no regional  wall motion abnormalities. Left ventricular diastolic parameters are  indeterminate.   2. Right ventricular systolic function is normal. The right ventricular  size is normal. Tricuspid regurgitation signal is inadequate for assessing  PA pressure.   3. Right atrium with prominent Eustachian valve as noted on prior  studies.   4. The  mitral valve is abnormal. Mild to moderate mitral valve  regurgitation. No evidence of mitral stenosis.   5. The aortic valve is tricuspid. There is moderate calcification of the  aortic valve. Aortic valve regurgitation is not visualized. No aortic  stenosis is present.   6. The inferior vena cava is normal in size with greater than 50%  respiratory  variability, suggesting right atrial pressure of 3 mmHg.   7. The patient was in atrial fibrillation.   ASSESSMENT & PLAN CHA2DS2-VASc Score = 5  The patient's score is based upon: CHF History: 0 HTN History: 1 Diabetes History: 0 Stroke History: 2 Vascular Disease History: 1 Age Score: 1 Gender Score: 0       ASSESSMENT AND PLAN: Paroxysmal Atrial Fibrillation (ICD10:  I48.0) The patient's CHA2DS2-VASc score is 5, indicating a 7.2% annual risk of stroke.   S/p Afib ablation on 12/16/22 by Dr. Elberta Fortis.  He is currently in NSR.  Continue amiodarone 100 mg daily.   Secondary Hypercoagulable State (ICD10:  D68.69) The patient is at significant risk for stroke/thromboembolism based upon his CHA2DS2-VASc Score of 5.  Continue Rivaroxaban (Xarelto).     Follow up as scheduled with EP.    Lake Bells, PA-C  Af[facility] [address] [place], [place] [address] [phone]

## 2023-01-20 ENCOUNTER — Other Ambulatory Visit: Payer: Self-pay

## 2023-01-20 ENCOUNTER — Other Ambulatory Visit (HOSPITAL_COMMUNITY): Payer: Self-pay | Admitting: Cardiology

## 2023-01-20 ENCOUNTER — Other Ambulatory Visit (HOSPITAL_COMMUNITY): Payer: Self-pay

## 2023-01-20 MED ORDER — ROSUVASTATIN CALCIUM 40 MG PO TABS
40.0000 mg | ORAL_TABLET | Freq: Every day | ORAL | 1 refills | Status: DC
  Filled 2023-01-20: qty 30, 30d supply, fill #0
  Filled 2023-03-02 – 2023-04-05 (×3): qty 30, 30d supply, fill #1

## 2023-01-22 ENCOUNTER — Other Ambulatory Visit (HOSPITAL_COMMUNITY): Payer: Self-pay

## 2023-01-22 MED ORDER — POTASSIUM CHLORIDE CRYS ER 20 MEQ PO TBCR
20.0000 meq | EXTENDED_RELEASE_TABLET | Freq: Every day | ORAL | 3 refills | Status: DC
  Filled 2023-01-22: qty 90, 90d supply, fill #0
  Filled 2023-05-27: qty 90, 90d supply, fill #1
  Filled 2023-09-06 – 2023-11-29 (×2): qty 90, 90d supply, fill #2

## 2023-01-26 ENCOUNTER — Other Ambulatory Visit (HOSPITAL_COMMUNITY): Payer: Self-pay

## 2023-02-23 ENCOUNTER — Other Ambulatory Visit (HOSPITAL_COMMUNITY): Payer: Self-pay

## 2023-03-07 NOTE — Progress Notes (Unsigned)
Electrophysiology Office Note:   Date:  03/08/2023  ID:  [name], DOB [birth year], MRN [id number]  Primary Cardiologist: Lesleigh Noe, MD (Inactive) Primary Heart Failure: None Electrophysiologist: Lewayne Bunting, MD      History of Present Illness:   [name] is a 68 y.o. male with h/o AF, AFL, HTN, HLD, cor triatriatum, R MCA CVA (2016), ETOH abuse, seen today for routine electrophysiology followup.   He underwent AF ablation 12/06/22 by Dr. Elberta Fortis.  Since last being seen in our clinic the patient reports he has been doing well overall. He has not had any further evidence of AF (his typical sx was shortness of breath). He continues to smoke ~1/2ppd. The patient notes he ran out of his Xarelto approximately 2 weeks ago and has been unable to afford his medications. He is asking to go back to a 90d supply as it will be cheaper. Stays active by golfing.   He denies chest pain, palpitations, dyspnea, PND, orthopnea, nausea, vomiting, dizziness, syncope, edema, weight gain, or early satiety.   Review of systems complete and found to be negative unless listed in HPI.   EP Information / Studies Reviewed:    EKG is ordered today. Personal review as below.  EKG Interpretation Date/Time:  Monday March 08 2023 09:35:16 EST Ventricular Rate:  57 PR Interval:  178 QRS Duration:  98 QT Interval:  446 QTC Calculation: 434 R Axis:   72  Text Interpretation: Sinus bradycardia Confirmed by Canary Brim ([id number]) on 03/08/2023 9:36:14 AM   Studies:  ECHO 09/2022 > LVEF 55-60% EPS 12/16/22 > PVI ablation & second discrete focus   Arrhythmia / AAD AF > s/p PVI ablation as above AFL     Risk Assessment/Calculations:    CHA2DS2-VASc Score = 5   This indicates a 7.2% annual risk of stroke. The patient's score is based upon: CHF History: 0 HTN History: 1 Diabetes History: 0 Stroke History: 2 Vascular Disease History: 1 Age Score: 1 Gender Score: 0              Physical Exam:   VS:  BP 122/82   Pulse (!) 57   Ht 5\' 6"  (1.676 m)   Wt 154 lb 9.6 oz (70.1 kg)   SpO2 98%   BMI 24.95 kg/m    Wt Readings from Last 3 Encounters:  03/08/23 154 lb 9.6 oz (70.1 kg)  01/13/23 151 lb (68.5 kg)  12/16/22 160 lb (72.6 kg)     GEN: chronically ill appearing male in no acute distress NECK: No JVD; No carotid bruits CARDIAC: Regular rate and rhythm, no murmurs, rubs, gallops RESPIRATORY:  Clear to auscultation without rales, wheezing or rhonchi  ABDOMEN: Soft, non-tender, non-distended EXTREMITIES:  No edema; LUE changes consistent with prior CVA  ASSESSMENT AND PLAN:    Paroxysmal Atrial Fibrillation / Atrial Flutter  CHA2DS2-VASc 5. S/p ablation 12/16/22.   -continue amiodarone for now as is off anticoagulation due to cost. Consider stopping if consistent with OAC -EKG with NSR   -no AF burden / awareness since ablation   -reviewed [employer] for monitoring at home -continue anticoagulation for stroke prophylaxis > pt has been off for two weeks, has used prior 30 day card.  Asking for medication assistance review.  Pt requests 90d refill.   Secondary Hypercoagulable State  -continue Xarelto, dose reviewed and appropriate by Cr Cl of 61 mL/min as of 11/2022 labs  Hypertension  -well controlled on current regimen  Chronic dCHF  -euvolemic on exam  -per Cardiology   Cor Triatriatum Dextran  Found on TTE, not seen on cMRI   Tobacco Abuse  -cessation counseling    Follow up with Dr. Elberta Fortis or EP APP in 3 months  Signed, Canary Brim, NP-C, AGACNP-BC [facility] - Electrophysiology  03/08/2023, 10:06 AM

## 2023-03-08 ENCOUNTER — Ambulatory Visit: Payer: Medicare Other | Attending: Pulmonary Disease | Admitting: Pulmonary Disease

## 2023-03-08 ENCOUNTER — Encounter: Payer: Self-pay | Admitting: Pulmonary Disease

## 2023-03-08 ENCOUNTER — Other Ambulatory Visit (HOSPITAL_COMMUNITY): Payer: Self-pay

## 2023-03-08 VITALS — BP 122/82 | HR 57 | Ht 66.0 in | Wt 154.6 lb

## 2023-03-08 DIAGNOSIS — I480 Paroxysmal atrial fibrillation: Secondary | ICD-10-CM

## 2023-03-08 DIAGNOSIS — D6869 Other thrombophilia: Secondary | ICD-10-CM | POA: Diagnosis not present

## 2023-03-08 MED ORDER — RIVAROXABAN 20 MG PO TABS: 20.0000 mg | ORAL_TABLET | Freq: Every day | ORAL | 0 refills | Status: DC

## 2023-03-08 MED ORDER — RIVAROXABAN 20 MG PO TABS: 20.0000 mg | ORAL_TABLET | Freq: Every day | ORAL | 3 refills | Status: DC

## 2023-03-08 NOTE — Patient Instructions (Signed)
Medication Instructions:  Your physician recommends that you continue on your current medications as directed. Please refer to the Current Medication list given to you today.  *If you need a refill on your cardiac medications before your next appointment, please call your pharmacy*   Lab Work: None ordered.  If you have labs (blood work) drawn today and your tests are completely normal, you will receive your results only by: MyChart Message (if you have MyChart) OR A paper copy in the mail If you have any lab test that is abnormal or we need to change your treatment, we will call you to review the results.  Testing/Procedures: None ordered.  Follow-Up: At [facility], you and your health needs are our priority.  As part of our continuing mission to provide you with exceptional heart care, we have created designated Provider Care Teams.  These Care Teams include your primary Cardiologist (physician) and Advanced Practice Providers (APPs -  Physician Assistants and Nurse Practitioners) who all work together to provide you with the care you need, when you need it.    Your next appointment:   3 months  The format for your next appointment:   In Person  Provider:   Lewayne Bunting, MD{or one of the following Advanced Practice Providers on your designated Care Team:   Francis Dowse, [place] Casimiro Needle "Mardelle Matte" [place], [place] Earnest Rosier, NP   Important Information About Sugar

## 2023-03-12 ENCOUNTER — Other Ambulatory Visit (HOSPITAL_COMMUNITY): Payer: Self-pay

## 2023-04-05 ENCOUNTER — Other Ambulatory Visit (HOSPITAL_COMMUNITY): Payer: Self-pay

## 2023-05-27 ENCOUNTER — Other Ambulatory Visit (HOSPITAL_COMMUNITY): Payer: Self-pay | Admitting: Cardiology

## 2023-05-28 ENCOUNTER — Other Ambulatory Visit: Payer: Self-pay

## 2023-06-01 ENCOUNTER — Other Ambulatory Visit (HOSPITAL_COMMUNITY): Payer: Self-pay

## 2023-06-01 MED ORDER — ROSUVASTATIN CALCIUM 40 MG PO TABS
40.0000 mg | ORAL_TABLET | Freq: Every day | ORAL | 1 refills | Status: DC
  Filled 2023-06-01: qty 90, 90d supply, fill #0
  Filled 2023-09-06 – 2023-10-14 (×3): qty 30, 30d supply, fill #1

## 2023-06-08 ENCOUNTER — Ambulatory Visit: Payer: Medicare Other | Admitting: Internal Medicine

## 2023-08-03 ENCOUNTER — Other Ambulatory Visit (HOSPITAL_COMMUNITY): Payer: Self-pay | Admitting: Cardiology

## 2023-08-04 ENCOUNTER — Other Ambulatory Visit (HOSPITAL_COMMUNITY): Payer: Self-pay

## 2023-08-04 ENCOUNTER — Ambulatory Visit: Attending: Internal Medicine | Admitting: Internal Medicine

## 2023-08-04 ENCOUNTER — Encounter: Payer: Self-pay | Admitting: Internal Medicine

## 2023-08-04 VITALS — BP 136/72 | HR 65 | Ht 66.0 in | Wt 143.6 lb

## 2023-08-04 DIAGNOSIS — I5032 Chronic diastolic (congestive) heart failure: Secondary | ICD-10-CM | POA: Diagnosis not present

## 2023-08-04 DIAGNOSIS — Z79899 Other long term (current) drug therapy: Secondary | ICD-10-CM | POA: Diagnosis not present

## 2023-08-04 DIAGNOSIS — I480 Paroxysmal atrial fibrillation: Secondary | ICD-10-CM

## 2023-08-04 DIAGNOSIS — I10 Essential (primary) hypertension: Secondary | ICD-10-CM | POA: Diagnosis not present

## 2023-08-04 DIAGNOSIS — I483 Typical atrial flutter: Secondary | ICD-10-CM

## 2023-08-04 MED ORDER — ENTRESTO 24-26 MG PO TABS
1.0000 | ORAL_TABLET | Freq: Two times a day (BID) | ORAL | 11 refills | Status: DC
  Filled 2023-08-04: qty 60, 30d supply, fill #0
  Filled 2023-09-06 – 2023-10-14 (×2): qty 60, 30d supply, fill #1
  Filled 2023-11-29: qty 60, 30d supply, fill #2
  Filled 2024-02-23: qty 60, 30d supply, fill #3

## 2023-08-04 NOTE — Patient Instructions (Addendum)
 Medication Instructions:  Your physician has recommended you make the following change in your medication:  Stop amiodarone    Lab Work: BMP -- Today  You may go to any [facility] Location for your lab work:  [facility] - [address] ([facility]) - [address] [phone] [address]  [place] - [address]   [place]  - [address]   [place] - [address] - [address] [employer]  - [address] ([facility]   If you have labs (blood work) drawn today and your tests are completely normal, you will receive your results only by: [employer] (if you have MyChart)  If you have any lab test that is abnormal or we need to change your treatment, we will call you or send a MyChart message to review the results.  Testing/Procedures: None ordered.  Follow-Up: At [facility], you and your health needs are our priority.  As part of our continuing mission to provide you with exceptional heart care, we have created designated Provider Care Teams.  These Care Teams include your primary Cardiologist (physician) and Advanced Practice Providers (APPs -  Physician Assistants and Nurse Practitioners) who all work together to provide you with the care you need, when you need it.  We recommend signing up for the patient portal called MyChart.  Sign up information is provided on this After Visit Summary.  MyChart is used to connect with patients for Virtual Visits (Telemedicine).  Patients are able to view lab/test results, encounter notes, upcoming appointments, etc.  Non-urgent messages can be sent to your provider as well.   To learn more about what you can do with MyChart, go to [email or web address].    Your next appointment:   1 year(s)  The format for your next appointment:   In Person  Provider Will Camnitz, MD{or one of the following  Advanced Practice Providers on your designated Care Team:   Mertha Abrahams, [place] Merla Starch, [place] Neda Balk, NP  Note: Remote monitoring is used to monitor your Pacemaker/ ICD from home. This monitoring reduces the number of office visits required to check your device to one time per year. It allows us  to keep an eye on the functioning of your device to ensure it is working properly.

## 2023-08-04 NOTE — Progress Notes (Signed)
 HPI Mr. Leeson returns today for evaluation of PAF, remote cryptogenic stroke, dyslipidemia, and HTN. He admits to some non-compliance. He underwent afib ablation several months ago. He denies palpitations.  He is still smoking though he stopped drinking ETOH. The patient denies syncope. His ILR was removed several years ago. He has dyspnea with exertion.  No Known Allergies   Current Outpatient Medications  Medication Sig Dispense Refill   amiodarone  (PACERONE ) 200 MG tablet Take 0.5 tablets (100 mg total) by mouth daily. 45 tablet 3   furosemide  (LASIX ) 40 MG tablet Take 1 tablet (40mg ) by mouth daily AND 1/2 tablet (20mg ) daily. (Patient taking differently: Take 20-40 mg by mouth See admin instructions. Alternate taking 40 mg one day and 20 mg the next day) 90 tablet 3   potassium chloride  SA (KLOR-CON  M) 20 MEQ tablet Take 1 tablet (20 mEq total) by mouth daily. 90 tablet 3   rivaroxaban  (XARELTO ) 20 MG TABS tablet Take 1 tablet (20 mg total) by mouth daily with supper. 90 tablet 3   rosuvastatin  (CRESTOR ) 40 MG tablet Take 1 tablet (40 mg total) by mouth at bedtime. NEEDS FOLLOW UP APPOINTMENT FOR MORE REFILLS 60 tablet 1   sacubitril -valsartan  (ENTRESTO ) 24-26 MG Take 1 tablet by mouth 2 (two) times daily. 60 tablet 11   rivaroxaban  (XARELTO ) 20 MG TABS tablet Take 1 tablet (20 mg total) by mouth daily with supper. (Patient not taking: Reported on 08/04/2023) 14 tablet 0   No current facility-administered medications for this visit.     Past Medical History:  Diagnosis Date   AKI (acute kidney injury) (HCC) 03/20/2015   Alcohol  use with alcohol -induced mood disorder (HCC)    quit almost all ETOH in 07/2014, previously drank 18 to 20 beers daily.    Alterations of sensations following CVA (cerebrovascular accident) 09/10/2014   Arthritis    Colon polyps 2011   Hyperplastic and 1 tubular adenoma.  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 identified on TEE at time of stroke    Embolic stroke involving right middle cerebral artery (HCC) 10/29/2014   Gout    Hyperlipidemia    Hypertension    Paroxysmal atrial fibrillation (HCC)    a. identified on LINQ 08/2014   Stroke (HCC) 07/25/14   a. s/p MDT ILR implant.  on xarelto     ROS:   All systems reviewed and negative except as noted in the HPI.   Past Surgical History:  Procedure Laterality Date   ATRIAL FIBRILLATION ABLATION N/A 12/16/2022   Procedure: ATRIAL FIBRILLATION ABLATION;  Surgeon: Lei Pump, MD;  Location: [facility] INVASIVE CV LAB;  Service: Cardiovascular;  Laterality: N/A;   BUBBLE STUDY  08/15/2021   Procedure: BUBBLE STUDY;  Surgeon: Darlis Eisenmenger, MD;  Location: [facility] ENDOSCOPY;  Service: Cardiovascular;;   COLONOSCOPY  2011   Dr Tova Fresh.    EP IMPLANTABLE DEVICE N/A 07/27/2014   Procedure: Loop Recorder Insertion;  Surgeon: Tammie Fall, MD;  Location: [facility] INVASIVE CV LAB;  Service: Cardiovascular;  Laterality: N/A;   EP IMPLANTABLE DEVICE N/A 12/20/2014   Procedure: Loop Recorder Removal;  Surgeon: Tammie Fall, MD;  Location: [facility] INVASIVE CV LAB;  Service: Cardiovascular;  Laterality: N/A;   ESOPHAGOGASTRODUODENOSCOPY N/A 03/21/2015   Procedure: ESOPHAGOGASTRODUODENOSCOPY (EGD);  Surgeon: Alvis Jourdain, MD;  Location: [facility] ENDOSCOPY;  Service: Endoscopy;  Laterality: N/A;   LEFT HEART CATH AND CORONARY ANGIOGRAPHY N/A 05/15/2021   Procedure: LEFT HEART CATH AND  CORONARY ANGIOGRAPHY;  Surgeon: Lucendia Rusk, MD;  Location: [facility] INVASIVE CV LAB;  Service: Cardiovascular;  Laterality: N/A;   LOOP RECORDER IMPLANT  07/27/14   LOOP REVEAL LINQ ZOX09 - [id number]   loop recorder removed 2017     TEE WITHOUT CARDIOVERSION N/A 07/27/2014   Procedure: TRANSESOPHAGEAL ECHOCARDIOGRAM (TEE);  Surgeon: Hazle Lites, MD;  Location: [facility] ENDOSCOPY;  Service: Cardiovascular;  Laterality: N/A;   TEE WITHOUT CARDIOVERSION N/A 08/15/2021   Procedure: TRANSESOPHAGEAL ECHOCARDIOGRAM (TEE);  Surgeon: Darlis Eisenmenger, MD;  Location: [facility] ENDOSCOPY;  Service: Cardiovascular;  Laterality: N/A;     Family History  Problem Relation Age of Onset   Cancer Father        leukemia   Heart attack Mother    Heart disease Brother    Heart attack Brother 60       CABG, stenting   Hypertension Brother    Alcohol  abuse Brother      Social History   Socioeconomic History   Marital status: Widowed    Spouse name: Leary Provencal   Number of children: 3   Years of education: 9   Highest education level: 8th grade  Occupational History   Occupation: former Merchandiser, retail    Comment: disabled due to CVA  Tobacco Use   Smoking status: Every Day    Current packs/day: 2.50    Average packs/day: 2.5 packs/day for 40.0 years (100.0 ttl pk-yrs)    Types: Cigarettes   Smokeless tobacco: Never   Tobacco comments:    Currently smoking 1ppd 01/13/23  Vaping Use   Vaping status: Never Used  Substance and Sexual Activity   Alcohol  use: Not Currently   Drug use: Yes    Frequency: 14.0 times per week    Types: Marijuana    Comment: trying to help his appetite   Sexual activity: Yes    Partners: Female  Other Topics Concern   Not on file  Social History Narrative   Lives at home with wife, Leary Provencal   Caffeine use - tea 4-5 glasses a day   Social Drivers of Corporate investment banker Strain: Low Risk  (10/05/2022)   Received from [facility]   Overall Financial Resource Strain (CARDIA)    Difficulty of Paying Living Expenses: Not hard at all  Food Insecurity: No Food Insecurity (10/05/2022)   Received from [facility]   Hunger Vital Sign    Within the past 12 months, you worried that your food would run out before you got the money to buy more.: Never true    Within the past 12 months, the food you bought just didn't last and you didn't have money to get more.: Never true  Transportation Needs: No Transportation Needs (10/05/2022)   Received from [facility] - [facility]    Lack of Transportation  (Medical): No    Lack of Transportation (Non-Medical): No  Physical Activity: Insufficiently Active (10/05/2022)   Received [facility]   Exercise Vital Sign    On average, how many days per week do you engage in moderate to strenuous exercise (like a brisk walk)?: 3 days    On average, how many minutes do you engage in exercise at this level?: 20 min  Stress: No Stress Concern Present (10/05/2022)   Received from [facility] - Occupational Stress Questionnaire    Feeling of Stress : Not at all  Social Connections: Socially Integrated (10/05/2022)  Received from [facility]   Social Network    How would you rate your social network (family, work, friends)?: Good participation with social networks  Intimate Partner Violence: Not At Risk (10/05/2022)   Received from [facility]   HITS    Over the last 12 months how often did your partner physically hurt you?: Never    Over the last 12 months how often did your partner insult you or talk down to you?: Never    Over the last 12 months how often did your partner threaten you with physical harm?: Never    Over the last 12 months how often did your partner scream or curse at you?: Never     BP 136/72   Pulse 65   Ht 5' 6 (1.676 m)   Wt 143 lb 9.6 oz (65.1 kg)   SpO2 97%   BMI 23.18 kg/m   Physical Exam:  Well appearing NAD HEENT: Unremarkable Neck:  No JVD, no thyromegally Lymphatics:  No adenopathy Back:  No CVA tenderness Lungs:  Clear with no wheezes HEART:  Regular rate rhythm, no murmurs, no rubs, no clicks Abd:  soft, positive bowel sounds, no organomegally, no rebound, no guarding Ext:  2 plus pulses, no edema, no cyanosis, no clubbing Skin:  No rashes no nodules Neuro:  CN II through XII intact, motor grossly intact; left arm HP  EKG - nsr  DEVICE  Normal device function.  See PaceArt for details.   Assess/Plan:  1.PAF - he has been asymptomatic. He will  continue his xarelto . He is s/p PVI. 2. Tobacco abuse - I have asked the patient to stop smoking. 3. HTN - his blood pressure is controlled. Continue 4. Dyslipidemia - he appears to be tolerating a statin. Continue crestor .    Debbi Failing.D.

## 2023-08-05 LAB — BASIC METABOLIC PANEL WITH GFR
BUN/Creatinine Ratio: 13 (ref 10–24)
BUN: 16 mg/dL (ref 8–27)
CO2: 25 mmol/L (ref 20–29)
Calcium: 10.4 mg/dL — ABNORMAL HIGH (ref 8.6–10.2)
Chloride: 94 mmol/L — ABNORMAL LOW (ref 96–106)
Creatinine, Ser: 1.23 mg/dL (ref 0.76–1.27)
Glucose: 85 mg/dL (ref 70–99)
Potassium: 4.7 mmol/L (ref 3.5–5.2)
Sodium: 138 mmol/L (ref 134–144)
eGFR: 64 mL/min/{1.73_m2} (ref >59)

## 2023-09-06 ENCOUNTER — Other Ambulatory Visit: Payer: Self-pay

## 2023-09-06 ENCOUNTER — Other Ambulatory Visit (HOSPITAL_COMMUNITY): Payer: Self-pay

## 2023-09-15 ENCOUNTER — Other Ambulatory Visit (HOSPITAL_COMMUNITY): Payer: Self-pay

## 2023-09-16 ENCOUNTER — Other Ambulatory Visit (HOSPITAL_COMMUNITY): Payer: Self-pay

## 2023-10-14 ENCOUNTER — Other Ambulatory Visit (HOSPITAL_COMMUNITY): Payer: Self-pay | Admitting: Cardiology

## 2023-10-14 ENCOUNTER — Telehealth: Payer: Self-pay | Admitting: Pharmacy Technician

## 2023-10-14 ENCOUNTER — Other Ambulatory Visit: Payer: Self-pay

## 2023-10-14 ENCOUNTER — Other Ambulatory Visit (HOSPITAL_COMMUNITY): Payer: Self-pay

## 2023-10-14 MED ORDER — RIVAROXABAN 20 MG PO TABS
20.0000 mg | ORAL_TABLET | Freq: Every day | ORAL | 3 refills | Status: DC
  Filled 2023-10-14: qty 90, 90d supply, fill #0
  Filled 2024-02-23: qty 90, 90d supply, fill #1

## 2023-10-14 MED ORDER — FUROSEMIDE 40 MG PO TABS
60.0000 mg | ORAL_TABLET | Freq: Every day | ORAL | 3 refills | Status: DC
  Filled 2023-10-14: qty 90, 60d supply, fill #0

## 2023-10-14 NOTE — Telephone Encounter (Signed)
 Refilled Xarelto  for patient. Saw Dr. Waddell and got BMET on 08/04/23.

## 2023-10-14 NOTE — Telephone Encounter (Signed)
 Patient Advocate Encounter   The patient was approved for a [employer] grant that will help cover the cost of Entresto  Total amount awarded, [phone].  Effective: 09/14/23 - 09/12/24   APW:[id number] ERW:[id number] Hmnle:[id number] PI:[id number]  [employer] ID: [id number]   Pharmacy provided with approval and processing information. Patient informed via -pharmacy staff  Updated in [facility]

## 2023-11-29 ENCOUNTER — Other Ambulatory Visit (HOSPITAL_COMMUNITY): Payer: Self-pay

## 2023-11-29 ENCOUNTER — Other Ambulatory Visit (HOSPITAL_COMMUNITY): Payer: Self-pay | Admitting: Cardiology

## 2023-11-29 ENCOUNTER — Other Ambulatory Visit: Payer: Self-pay

## 2023-11-29 MED ORDER — ROSUVASTATIN CALCIUM 40 MG PO TABS
40.0000 mg | ORAL_TABLET | Freq: Every day | ORAL | 1 refills | Status: DC
  Filled 2023-11-29: qty 60, 60d supply, fill #0

## 2024-02-23 ENCOUNTER — Other Ambulatory Visit (HOSPITAL_COMMUNITY): Payer: Self-pay

## 2024-03-06 ENCOUNTER — Other Ambulatory Visit (HOSPITAL_COMMUNITY): Payer: Self-pay

## 2024-03-21 ENCOUNTER — Other Ambulatory Visit: Payer: Self-pay

## 2024-03-21 ENCOUNTER — Emergency Department (HOSPITAL_COMMUNITY)

## 2024-03-21 ENCOUNTER — Encounter (HOSPITAL_COMMUNITY): Payer: Self-pay | Admitting: Emergency Medicine

## 2024-03-21 ENCOUNTER — Inpatient Hospital Stay (HOSPITAL_COMMUNITY): Admission: EM | Admit: 2024-03-21 | Discharge: 2024-03-23 | DRG: 872 | Disposition: A

## 2024-03-21 DIAGNOSIS — R0603 Acute respiratory distress: Principal | ICD-10-CM

## 2024-03-21 DIAGNOSIS — A419 Sepsis, unspecified organism: Secondary | ICD-10-CM

## 2024-03-21 DIAGNOSIS — J9602 Acute respiratory failure with hypercapnia: Secondary | ICD-10-CM | POA: Diagnosis not present

## 2024-03-21 DIAGNOSIS — I5033 Acute on chronic diastolic (congestive) heart failure: Secondary | ICD-10-CM | POA: Diagnosis not present

## 2024-03-21 DIAGNOSIS — J9601 Acute respiratory failure with hypoxia: Secondary | ICD-10-CM | POA: Diagnosis not present

## 2024-03-21 DIAGNOSIS — I160 Hypertensive urgency: Secondary | ICD-10-CM | POA: Diagnosis present

## 2024-03-21 DIAGNOSIS — E785 Hyperlipidemia, unspecified: Secondary | ICD-10-CM | POA: Diagnosis present

## 2024-03-21 DIAGNOSIS — I169 Hypertensive crisis, unspecified: Secondary | ICD-10-CM

## 2024-03-21 DIAGNOSIS — Z8673 Personal history of transient ischemic attack (TIA), and cerebral infarction without residual deficits: Secondary | ICD-10-CM

## 2024-03-21 DIAGNOSIS — J189 Pneumonia, unspecified organism: Secondary | ICD-10-CM | POA: Diagnosis not present

## 2024-03-21 DIAGNOSIS — Z720 Tobacco use: Secondary | ICD-10-CM

## 2024-03-21 DIAGNOSIS — I480 Paroxysmal atrial fibrillation: Secondary | ICD-10-CM | POA: Insufficient documentation

## 2024-03-21 LAB — COMPREHENSIVE METABOLIC PANEL WITH GFR
ALT: 19 U/L (ref 0–44)
AST: 31 U/L (ref 15–41)
Albumin: 3.9 g/dL (ref 3.5–5.0)
Alkaline Phosphatase: 110 U/L (ref 38–126)
Anion gap: 18 — ABNORMAL HIGH (ref 5–15)
BUN: 12 mg/dL (ref 8–23)
CO2: 19 mmol/L — ABNORMAL LOW (ref 22–32)
Calcium: 9.3 mg/dL (ref 8.9–10.3)
Chloride: 103 mmol/L (ref 98–111)
Creatinine, Ser: 1.25 mg/dL — ABNORMAL HIGH (ref 0.61–1.24)
GFR, Estimated: >60 mL/min
Glucose, Bld: 284 mg/dL — ABNORMAL HIGH (ref 70–99)
Potassium: 3.7 mmol/L (ref 3.5–5.1)
Sodium: 140 mmol/L (ref 135–145)
Total Bilirubin: 0.4 mg/dL (ref 0.0–1.2)
Total Protein: 7.8 g/dL (ref 6.5–8.1)

## 2024-03-21 LAB — CBC WITH DIFFERENTIAL/PLATELET
Abs Immature Granulocytes: 0.09 10*3/uL — ABNORMAL HIGH (ref 0.00–0.07)
Basophils Absolute: 0.1 10*3/uL (ref 0.0–0.1)
Basophils Relative: 1 %
Eosinophils Absolute: 0.3 10*3/uL (ref 0.0–0.5)
Eosinophils Relative: 2 %
HCT: 49.9 % (ref 39.0–52.0)
Hemoglobin: 16.4 g/dL (ref 13.0–17.0)
Immature Granulocytes: 1 %
Lymphocytes Relative: 49 %
Lymphs Abs: 8.1 10*3/uL — ABNORMAL HIGH (ref 0.7–4.0)
MCH: 31.8 pg (ref 26.0–34.0)
MCHC: 32.9 g/dL (ref 30.0–36.0)
MCV: 96.7 fL (ref 80.0–100.0)
Monocytes Absolute: 1.1 10*3/uL — ABNORMAL HIGH (ref 0.1–1.0)
Monocytes Relative: 7 %
Neutro Abs: 6.4 10*3/uL (ref 1.7–7.7)
Neutrophils Relative %: 40 %
Platelets: 331 10*3/uL (ref 150–400)
RBC: 5.16 MIL/uL (ref 4.22–5.81)
RDW: 12.8 % (ref 11.5–15.5)
WBC: 16.1 10*3/uL — ABNORMAL HIGH (ref 4.0–10.5)
nRBC: 0 % (ref 0.0–0.2)

## 2024-03-21 LAB — BASIC METABOLIC PANEL WITH GFR
Anion gap: 15 (ref 5–15)
BUN: 13 mg/dL (ref 8–23)
CO2: 19 mmol/L — ABNORMAL LOW (ref 22–32)
Calcium: 8.9 mg/dL (ref 8.9–10.3)
Chloride: 103 mmol/L (ref 98–111)
Creatinine, Ser: 1.12 mg/dL (ref 0.61–1.24)
GFR, Estimated: >60 mL/min
Glucose, Bld: 172 mg/dL — ABNORMAL HIGH (ref 70–99)
Potassium: 3.8 mmol/L (ref 3.5–5.1)
Sodium: 138 mmol/L (ref 135–145)

## 2024-03-21 LAB — CBC
HCT: 47.2 % (ref 39.0–52.0)
HCT: 42.4 % (ref 39.0–52.0)
Hemoglobin: 15.4 g/dL (ref 13.0–17.0)
Hemoglobin: 14.6 g/dL (ref 13.0–17.0)
MCH: 31.3 pg (ref 26.0–34.0)
MCH: 31.3 pg (ref 26.0–34.0)
MCHC: 32.6 g/dL (ref 30.0–36.0)
MCHC: 34.4 g/dL (ref 30.0–36.0)
MCV: 95.9 fL (ref 80.0–100.0)
MCV: 90.8 fL (ref 80.0–100.0)
Platelets: 291 10*3/uL (ref 150–400)
Platelets: 281 10*3/uL (ref 150–400)
RBC: 4.92 MIL/uL (ref 4.22–5.81)
RBC: 4.67 MIL/uL (ref 4.22–5.81)
RDW: 12.9 % (ref 11.5–15.5)
RDW: 12.8 % (ref 11.5–15.5)
WBC: 20.4 10*3/uL — ABNORMAL HIGH (ref 4.0–10.5)
WBC: 12.9 10*3/uL — ABNORMAL HIGH (ref 4.0–10.5)
nRBC: 0 % (ref 0.0–0.2)
nRBC: 0 % (ref 0.0–0.2)

## 2024-03-21 LAB — I-STAT VENOUS BLOOD GAS, ED
Acid-Base Excess: 3 mmol/L — ABNORMAL HIGH (ref 0.0–2.0)
Acid-base deficit: 10 mmol/L — ABNORMAL HIGH (ref 0.0–2.0)
Acid-base deficit: 4 mmol/L — ABNORMAL HIGH (ref 0.0–2.0)
Bicarbonate: 21 mmol/L (ref 20.0–28.0)
Bicarbonate: 23.9 mmol/L (ref 20.0–28.0)
Bicarbonate: 26 mmol/L (ref 20.0–28.0)
Calcium, Ion: 1.22 mmol/L (ref 1.15–1.40)
Calcium, Ion: 1.24 mmol/L (ref 1.15–1.40)
Calcium, Ion: 1.03 mmol/L — ABNORMAL LOW (ref 1.15–1.40)
HCT: 50 % (ref 39.0–52.0)
HCT: 47 % (ref 39.0–52.0)
HCT: 42 % (ref 39.0–52.0)
Hemoglobin: 17 g/dL (ref 13.0–17.0)
Hemoglobin: 16 g/dL (ref 13.0–17.0)
Hemoglobin: 14.3 g/dL (ref 13.0–17.0)
O2 Saturation: 91 %
O2 Saturation: 77 %
O2 Saturation: 98 %
Potassium: 3.3 mmol/L — ABNORMAL LOW (ref 3.5–5.1)
Potassium: 3.7 mmol/L (ref 3.5–5.1)
Potassium: 4.4 mmol/L (ref 3.5–5.1)
Sodium: 132 mmol/L — ABNORMAL LOW (ref 135–145)
Sodium: 140 mmol/L (ref 135–145)
Sodium: 139 mmol/L (ref 135–145)
TCO2: 23 mmol/L (ref 22–32)
TCO2: 25 mmol/L (ref 22–32)
TCO2: 27 mmol/L (ref 22–32)
pCO2, Ven: 63.5 mmHg — ABNORMAL HIGH (ref 44–60)
pCO2, Ven: 54.3 mmHg (ref 44–60)
pCO2, Ven: 34.5 mmHg — ABNORMAL LOW (ref 44–60)
pH, Ven: 7.128 — CL (ref 7.25–7.43)
pH, Ven: 7.251 (ref 7.25–7.43)
pH, Ven: 7.486 — ABNORMAL HIGH (ref 7.25–7.43)
pO2, Ven: 82 mmHg — ABNORMAL HIGH (ref 32–45)
pO2, Ven: 49 mmHg — ABNORMAL HIGH (ref 32–45)
pO2, Ven: 101 mmHg — ABNORMAL HIGH (ref 32–45)

## 2024-03-21 LAB — HIV ANTIBODY (ROUTINE TESTING W REFLEX): HIV Screen 4th Generation wRfx: NONREACTIVE

## 2024-03-21 LAB — PROTIME-INR
INR: 1 (ref 0.8–1.2)
Prothrombin Time: 13.4 s (ref 11.4–15.2)

## 2024-03-21 LAB — PRO BRAIN NATRIURETIC PEPTIDE: Pro Brain Natriuretic Peptide: 516 pg/mL — ABNORMAL HIGH

## 2024-03-21 LAB — PROCALCITONIN: Procalcitonin: 1.6 ng/mL

## 2024-03-21 LAB — TROPONIN T, HIGH SENSITIVITY
Troponin T High Sensitivity: 6 ng/L (ref 0–19)
Troponin T High Sensitivity: 53 ng/L — ABNORMAL HIGH (ref 0–19)
Troponin T High Sensitivity: 82 ng/L — ABNORMAL HIGH (ref 0–19)
Troponin T High Sensitivity: 67 ng/L — ABNORMAL HIGH (ref 0–19)

## 2024-03-21 LAB — RESP PANEL BY RT-PCR (RSV, FLU A&B, COVID)  RVPGX2
Influenza A by PCR: NEGATIVE
Influenza B by PCR: NEGATIVE
Resp Syncytial Virus by PCR: NEGATIVE
SARS Coronavirus 2 by RT PCR: NEGATIVE

## 2024-03-21 LAB — STREP PNEUMONIAE URINARY ANTIGEN: Strep Pneumo Urinary Antigen: NEGATIVE

## 2024-03-21 LAB — MAGNESIUM: Magnesium: 2 mg/dL (ref 1.7–2.4)

## 2024-03-21 LAB — CORTISOL-AM, BLOOD: Cortisol - AM: 21.5 ug/dL (ref 6.7–22.6)

## 2024-03-21 MED ORDER — SODIUM CHLORIDE 0.9 % IV SOLN: 500.0000 mg | Freq: Once | INTRAVENOUS | Status: DC

## 2024-03-21 MED ORDER — ACETAMINOPHEN 650 MG RE SUPP: 650.0000 mg | Freq: Four times a day (QID) | RECTAL | Status: DC | PRN

## 2024-03-21 MED ORDER — IPRATROPIUM-ALBUTEROL 0.5-2.5 (3) MG/3ML IN SOLN
3.0000 mL | Freq: Four times a day (QID) | RESPIRATORY_TRACT | Status: DC
  Administered 2024-03-21 – 2024-03-23 (×9): 3 mL via RESPIRATORY_TRACT
  Filled 2024-03-21 (×9): qty 3

## 2024-03-21 MED ORDER — SODIUM CHLORIDE 0.9 % IV SOLN
2.0000 g | INTRAVENOUS | Status: DC
  Administered 2024-03-22: 2 g via INTRAVENOUS
  Filled 2024-03-21: qty 20

## 2024-03-21 MED ORDER — HYDRALAZINE HCL 20 MG/ML IJ SOLN: 10.0000 mg | Freq: Four times a day (QID) | INTRAMUSCULAR | Status: DC | PRN

## 2024-03-21 MED ORDER — SACUBITRIL-VALSARTAN 24-26 MG PO TABS
1.0000 | ORAL_TABLET | Freq: Two times a day (BID) | ORAL | Status: DC
  Administered 2024-03-21 – 2024-03-23 (×5): 1 via ORAL
  Filled 2024-03-21 (×5): qty 1

## 2024-03-21 MED ORDER — ONDANSETRON HCL 4 MG/2ML IJ SOLN: 4.0000 mg | Freq: Four times a day (QID) | INTRAMUSCULAR | Status: DC | PRN

## 2024-03-21 MED ORDER — FUROSEMIDE 10 MG/ML IJ SOLN
40.0000 mg | Freq: Two times a day (BID) | INTRAMUSCULAR | Status: DC
  Administered 2024-03-21: 40 mg via INTRAVENOUS
  Filled 2024-03-21: qty 4

## 2024-03-21 MED ORDER — ROSUVASTATIN CALCIUM 20 MG PO TABS
40.0000 mg | ORAL_TABLET | Freq: Every day | ORAL | Status: DC
  Administered 2024-03-21 – 2024-03-22 (×2): 40 mg via ORAL
  Filled 2024-03-21 (×2): qty 2

## 2024-03-21 MED ORDER — SODIUM CHLORIDE 0.9 % IV SOLN
100.0000 mg | Freq: Once | INTRAVENOUS | Status: AC
  Administered 2024-03-21: 100 mg via INTRAVENOUS
  Filled 2024-03-21: qty 100

## 2024-03-21 MED ORDER — SODIUM CHLORIDE 0.9 % IV SOLN
2.0000 g | Freq: Once | INTRAVENOUS | Status: AC
  Administered 2024-03-21: 2 g via INTRAVENOUS
  Filled 2024-03-21: qty 20

## 2024-03-21 MED ORDER — HYDROCOD POLI-CHLORPHE POLI ER 10-8 MG/5ML PO SUER: 5.0000 mL | Freq: Two times a day (BID) | ORAL | Status: DC | PRN

## 2024-03-21 MED ORDER — TRAZODONE HCL 50 MG PO TABS
25.0000 mg | ORAL_TABLET | Freq: Every evening | ORAL | Status: DC | PRN
  Administered 2024-03-21 – 2024-03-22 (×2): 25 mg via ORAL
  Filled 2024-03-21 (×2): qty 1

## 2024-03-21 MED ORDER — ONDANSETRON HCL 4 MG PO TABS: 4.0000 mg | ORAL_TABLET | Freq: Four times a day (QID) | ORAL | Status: DC | PRN

## 2024-03-21 MED ORDER — RIVAROXABAN 20 MG PO TABS
20.0000 mg | ORAL_TABLET | Freq: Every day | ORAL | Status: DC
  Administered 2024-03-21 – 2024-03-22 (×2): 20 mg via ORAL
  Filled 2024-03-21 (×2): qty 1

## 2024-03-21 MED ORDER — POTASSIUM CHLORIDE CRYS ER 20 MEQ PO TBCR
20.0000 meq | EXTENDED_RELEASE_TABLET | Freq: Every day | ORAL | Status: DC
  Administered 2024-03-21 – 2024-03-22 (×2): 20 meq via ORAL
  Filled 2024-03-21 (×2): qty 1

## 2024-03-21 MED ORDER — NITROGLYCERIN IN D5W 200-5 MCG/ML-% IV SOLN
0.0000 ug/min | INTRAVENOUS | Status: DC
  Administered 2024-03-21: 100 ug/min via INTRAVENOUS

## 2024-03-21 MED ORDER — MAGNESIUM HYDROXIDE 400 MG/5ML PO SUSP: 30.0000 mL | Freq: Every day | ORAL | Status: DC | PRN

## 2024-03-21 MED ORDER — ACETAMINOPHEN 325 MG PO TABS: 650.0000 mg | ORAL_TABLET | Freq: Four times a day (QID) | ORAL | Status: DC | PRN

## 2024-03-21 MED ORDER — FUROSEMIDE 40 MG PO TABS
60.0000 mg | ORAL_TABLET | Freq: Every day | ORAL | Status: DC
  Administered 2024-03-22: 60 mg via ORAL
  Filled 2024-03-21: qty 1

## 2024-03-21 MED ORDER — FUROSEMIDE 10 MG/ML IJ SOLN
40.0000 mg | Freq: Once | INTRAMUSCULAR | Status: AC
  Administered 2024-03-21: 40 mg via INTRAVENOUS
  Filled 2024-03-21: qty 4

## 2024-03-21 MED ORDER — AZITHROMYCIN 250 MG PO TABS
500.0000 mg | ORAL_TABLET | Freq: Every day | ORAL | Status: DC
  Administered 2024-03-21 – 2024-03-22 (×2): 500 mg via ORAL
  Filled 2024-03-21 (×2): qty 2

## 2024-03-21 MED ORDER — NICOTINE 21 MG/24HR TD PT24
21.0000 mg | MEDICATED_PATCH | Freq: Every day | TRANSDERMAL | Status: DC | PRN
  Administered 2024-03-23: 21 mg via TRANSDERMAL
  Filled 2024-03-21: qty 1

## 2024-03-21 MED ORDER — GUAIFENESIN ER 600 MG PO TB12
600.0000 mg | ORAL_TABLET | Freq: Two times a day (BID) | ORAL | Status: DC
  Administered 2024-03-21 – 2024-03-23 (×5): 600 mg via ORAL
  Filled 2024-03-21 (×5): qty 1

## 2024-03-21 NOTE — ED Notes (Signed)
 Pt states he feels much better than earlier.

## 2024-03-21 NOTE — ED Notes (Signed)
 Floor notified patient coming up

## 2024-03-21 NOTE — ED Provider Notes (Signed)
 " [place] EMERGENCY DEPARTMENT AT [facility] Provider Note   CSN: [id number] Arrival date & time: 03/21/24  9770     Patient presents with: Respiratory Distress   [name] is a 69 y.o. male.   HPI Patient presents for respiratory distress.  Medical history includes HTN, HLD, CAD, atrial fibrillation, gout, chronic pain, anxiety, alcohol  abuse.  Per EMS, he had sudden onset of shortness of breath this evening.  When they arrived on scene, he had labored breathing, tripod positioning, diaphoresis.  He was placed on CPAP.  Despite CPAP, sats were unable to be improved to above the low 70s.  Other vital signs were notable for hypertension.  He was given several doses of SL NTG prior to arrival.    Prior to Admission medications  Medication Sig Start Date End Date Taking? Authorizing Provider  furosemide  (LASIX ) 40 MG tablet Take 1 & 1/2 tablets (60 mg total) by mouth daily. PLEASE SCHEDULE APPOINTMENT FOR MORE REFILLS 10/14/23   Rolan Ezra RAMAN, MD  potassium chloride  SA (KLOR-CON  M) 20 MEQ tablet Take 1 tablet (20 mEq total) by mouth daily. 01/22/23   Camnitz, Soyla Lunger, MD  rivaroxaban  (XARELTO ) 20 MG TABS tablet Take 1 tablet (20 mg total) by mouth daily with supper. 10/14/23   Waddell Danelle ORN, MD  rosuvastatin  (CRESTOR ) 40 MG tablet Take 1 tablet (40 mg total) by mouth at bedtime. NEEDS FOLLOW UP APPOINTMENT FOR MORE REFILLS 11/29/23   Rolan Ezra RAMAN, MD  sacubitril -valsartan  (ENTRESTO ) 24-26 MG Take 1 tablet by mouth 2 (two) times daily. 08/04/23   Waddell Danelle ORN, MD    Allergies: Patient has no known allergies.    Review of Systems  Unable to perform ROS: Severe respiratory distress  Constitutional:  Positive for diaphoresis.  Respiratory:  Positive for shortness of breath.     Updated Vital Signs BP 128/79   Pulse (!) 102   Temp (!) 95.4 F (35.2 C) (Rectal)   Resp (!) 27   Ht 5' 6 (1.676 m)   Wt 65.1 kg   SpO2 100%   BMI 23.16 kg/m   Physical  Exam Vitals and nursing note reviewed.  Constitutional:      General: He is not in acute distress.    Appearance: He is well-developed and normal weight. He is ill-appearing.  HENT:     Head: Normocephalic and atraumatic.     Right Ear: External ear normal.     Left Ear: External ear normal.     Nose: Nose normal.     Mouth/Throat:     Mouth: Mucous membranes are moist.  Eyes:     Conjunctiva/sclera: Conjunctivae normal.  Cardiovascular:     Rate and Rhythm: Regular rhythm. Tachycardia present.     Heart sounds: No murmur heard. Pulmonary:     Effort: Tachypnea, accessory muscle usage and respiratory distress present.     Breath sounds: Rhonchi and rales present.  Abdominal:     General: There is no distension.     Palpations: Abdomen is soft.     Tenderness: There is no abdominal tenderness.  Musculoskeletal:        General: No swelling.     Cervical back: Normal range of motion and neck supple.  Skin:    General: Skin is warm and dry.     Coloration: Skin is not jaundiced or pale.  Neurological:     General: No focal deficit present.     (all labs ordered are  listed, but only abnormal results are displayed) Labs Reviewed  COMPREHENSIVE METABOLIC PANEL WITH GFR - Abnormal; Notable for the following components:      Result Value   CO2 19 (*)    Glucose, Bld 284 (*)    Creatinine, Ser 1.25 (*)    Anion gap 18 (*)    All other components within normal limits  CBC WITH DIFFERENTIAL/PLATELET - Abnormal; Notable for the following components:   WBC 16.1 (*)    Lymphs Abs 8.1 (*)    Monocytes Absolute 1.1 (*)    Abs Immature Granulocytes 0.09 (*)    All other components within normal limits  PRO BRAIN NATRIURETIC PEPTIDE - Abnormal; Notable for the following components:   Pro Brain Natriuretic Peptide 516.0 (*)    All other components within normal limits  I-STAT VENOUS BLOOD GAS, ED - Abnormal; Notable for the following components:   pH, Ven 7.128 (*)    pCO2, Ven  63.5 (*)    pO2, Ven 82 (*)    Acid-base deficit 10.0 (*)    Sodium 132 (*)    Potassium 3.3 (*)    All other components within normal limits  RESP PANEL BY RT-PCR (RSV, FLU A&B, COVID)  RVPGX2  CULTURE, BLOOD (ROUTINE X 2)  CULTURE, BLOOD (ROUTINE X 2)  MAGNESIUM   BLOOD GAS, VENOUS  TROPONIN T, HIGH SENSITIVITY    EKG: None  Radiology: [facility] Chest Port 1 View Result Date: 03/21/2024 EXAM: 1 VIEW(S) XRAY OF THE CHEST 03/21/2024 02:53:22 AM COMPARISON: Chest x-ray 05/27/2021. CLINICAL HISTORY: Dyspnea. FINDINGS: LUNGS AND PLEURA: Asymmetric interstitial and hazy airspace disease in right lung. Small layering right pleural effusion. Nodular density left lower lobe consistent with nipple shadow. No pneumothorax. HEART AND MEDIASTINUM: No acute abnormality of the cardiac and mediastinal silhouettes. BONES AND SOFT TISSUES: Osteopenia. Moderate thoracic spondylosis. IMPRESSION: 1. Asymmetric interstitial and hazy airspace disease in the right lung with small layering right pleural effusion. 2. Nodular density in the left lower lobe consistent with nipple shadow. 3. Osteopenia and moderate thoracic spondylosis. Electronically signed by: Greig Pique MD 03/21/2024 02:57 AM EST RP Workstation: [id number]     Procedures   Medications Ordered in the ED  nitroGLYCERIN  50 mg in dextrose  5 % 250 mL (0.2 mg/mL) infusion (175 mcg/min Intravenous Rate/Dose Change 03/21/24 0323)  cefTRIAXone  (ROCEPHIN ) 2 g in sodium chloride  0.9 % 100 mL IVPB (has no administration in time range)  doxycycline  (VIBRAMYCIN ) 100 mg in sodium chloride  0.9 % 250 mL IVPB (has no administration in time range)  furosemide  (LASIX ) injection 40 mg (has no administration in time range)                                    Medical Decision Making Amount and/or Complexity of Data Reviewed Labs: ordered. Radiology: ordered.  Risk Prescription drug management.   This patient presents to the ED for concern of respiratory  distress, this involves an extensive number of treatment options, and is a complaint that carries with it a high risk of complications and morbidity.  The differential diagnosis includes pulmonary edema, hypertensive crisis, pneumonia, aspiration, reactive airway disease   Co morbidities / Chronic conditions that complicate the patient evaluation  HTN, HLD, CAD, atrial fibrillation, gout, chronic pain, anxiety, alcohol  abuse   Additional history obtained:  Additional history obtained from EMR External records from outside source obtained and reviewed including EMS   Lab  Tests:  I Ordered, and personally interpreted labs.  The pertinent results include: Leukocytosis is present.  Creatinine is baseline.  Electrolytes are normal.  Respiratory acidosis is present on blood gas.   Imaging Studies ordered:  I ordered imaging studies including chest x-ray I independently visualized and interpreted imaging which showed asymmetric interstitial airspace disease in right lung I agree with the radiologist interpretation   Cardiac Monitoring: / EKG:  The patient was maintained on a cardiac monitor.  I personally viewed and interpreted the cardiac monitored which showed an underlying rhythm of: Sinus rhythm   Problem List / ED Course / Critical interventions / Medication management  Patient presenting for acute onset of respiratory distress this evening.  He arrives in the ED via EMS on CPAP.  He remains in respiratory distress.  He is switched to BiPAP with improved symptoms and improved SpO2 into the mid 90s.  His blood pressure remains elevated in the range of 190s SBP.  NTG gtt. was ordered.  Workup was initiated.  On rectal temperature, patient was found to be hypothermic.  Warming blanket was applied.  His x-ray shows right-sided opacities.  Antibiotics were ordered for empiric treatment of pneumonia.  On 200 mcg/min of NTG, blood pressure is improved in the 160s SBP range.  His work of  breathing and tachypnea have improved as well.  SpO2 remains at 100%.  On further reassessment, blood pressure has normalized.  Patient is now breathing comfortably on BiPAP.  Patient to be admitted for further management. I ordered medication including NTG for hypertensive crisis; Lasix  for diuresis; ceftriaxone  and doxycycline  for empiric treatment of pneumonia Reevaluation of the patient after these medicines showed that the patient improved I have reviewed the patients home medicines and have made adjustments as needed  Social Determinants of Health:  Lives independently  CRITICAL CARE Performed by: Bernardino Fireman   Total critical care time: 33 minutes  Critical care time was exclusive of separately billable procedures and treating other patients.  Critical care was necessary to treat or prevent imminent or life-threatening deterioration.  Critical care was time spent personally by me on the following activities: development of treatment plan with patient and/or surrogate as well as nursing, discussions with consultants, evaluation of patient's response to treatment, examination of patient, obtaining history from patient or surrogate, ordering and performing treatments and interventions, ordering and review of laboratory studies, ordering and review of radiographic studies, pulse oximetry and re-evaluation of patient's condition.     Final diagnoses:  Respiratory distress  Hypertensive crisis  Acute respiratory failure with hypoxia and hypercapnia [facility])    ED Discharge Orders     None          Fireman Bernardino, MD 03/21/24 [phone]  "

## 2024-03-21 NOTE — Assessment & Plan Note (Signed)
 Echocardiogram with preserved LV systolic function EF 50 to 55%, grade I diastolic dysfunction with impaired relaxation, RV systolic function preserved, mild to moderate mitral valve regurgitation, no pericardial effusion. LA and RA with normal size.   Patient was placed on IV furosemide  for diuresis, negative fluid balance was achieved, with improvement in his symptoms   Plan to continue medical therapy with entresto , added spironolactone  and SGLT 2 inh. Consider adding B blocker as outpatient  Loop diuretic as needed. Follow up as outpatient  Acute hypoxemic and hypercapnic respiratory failure due to acute cardiogenic pulmonary edema Patient responded well to diuresis, at the time of discharge his 02 saturation is 96% on room air.  Sepsis and pneumonia ruled out, discontinue antibiotics Reactive leukocytosis improved, at the time of discharge wbc is 8.5

## 2024-03-21 NOTE — Assessment & Plan Note (Addendum)
 Uncontrolled hypertension  Patient was placed on entresoto and had aggressive diuresis with improvement in blood pressure Plan to continue entresto  and spironolactone  Follow up as outpatient

## 2024-03-21 NOTE — Assessment & Plan Note (Deleted)
-   This is due to right sided community-acquired pneumonia with parapneumonic effusion. - Sepsis is manifested by hypothermia, tachycardia, tachypnea and leukocytosis. -The patient will be placed on IV Rocephin  and p.o. Zithromax . - Will follow blood cultures. - Mucolytic therapy will be provided. - Bronchodilator therapy will be given. - The patient was placed on warming blanket.

## 2024-03-21 NOTE — Assessment & Plan Note (Signed)
 Will continue statin therapy

## 2024-03-22 ENCOUNTER — Inpatient Hospital Stay (HOSPITAL_COMMUNITY)

## 2024-03-22 ENCOUNTER — Encounter (HOSPITAL_COMMUNITY): Payer: Self-pay | Admitting: Family Medicine

## 2024-03-22 DIAGNOSIS — I5031 Acute diastolic (congestive) heart failure: Secondary | ICD-10-CM

## 2024-03-22 DIAGNOSIS — I169 Hypertensive crisis, unspecified: Secondary | ICD-10-CM

## 2024-03-22 LAB — BASIC METABOLIC PANEL WITH GFR
Anion gap: 9 (ref 5–15)
BUN: 14 mg/dL (ref 8–23)
CO2: 27 mmol/L (ref 22–32)
Calcium: 9.3 mg/dL (ref 8.9–10.3)
Chloride: 102 mmol/L (ref 98–111)
Creatinine, Ser: 0.93 mg/dL (ref 0.61–1.24)
GFR, Estimated: >60 mL/min
Glucose, Bld: 99 mg/dL (ref 70–99)
Potassium: 3.8 mmol/L (ref 3.5–5.1)
Sodium: 138 mmol/L (ref 135–145)

## 2024-03-22 LAB — CBC
HCT: 44.8 % (ref 39.0–52.0)
Hemoglobin: 14.9 g/dL (ref 13.0–17.0)
MCH: 30.7 pg (ref 26.0–34.0)
MCHC: 33.3 g/dL (ref 30.0–36.0)
MCV: 92.2 fL (ref 80.0–100.0)
Platelets: 275 10*3/uL (ref 150–400)
RBC: 4.86 MIL/uL (ref 4.22–5.81)
RDW: 13 % (ref 11.5–15.5)
WBC: 8.5 10*3/uL (ref 4.0–10.5)
nRBC: 0 % (ref 0.0–0.2)

## 2024-03-22 MED ORDER — FUROSEMIDE 20 MG PO TABS
20.0000 mg | ORAL_TABLET | Freq: Every day | ORAL | Status: DC
  Administered 2024-03-23: 20 mg via ORAL
  Filled 2024-03-22: qty 1

## 2024-03-22 MED ORDER — EMPAGLIFLOZIN 10 MG PO TABS
10.0000 mg | ORAL_TABLET | Freq: Every day | ORAL | Status: DC
  Administered 2024-03-22 – 2024-03-23 (×2): 10 mg via ORAL
  Filled 2024-03-22 (×2): qty 1

## 2024-03-22 MED ORDER — SPIRONOLACTONE 12.5 MG HALF TABLET
12.5000 mg | ORAL_TABLET | Freq: Every day | ORAL | Status: DC
  Administered 2024-03-22 – 2024-03-23 (×2): 12.5 mg via ORAL
  Filled 2024-03-22 (×2): qty 1

## 2024-03-22 NOTE — Discharge Instructions (Signed)
 Low income subsidy application for assistance with medication cost: [email or web address] or call [phone]

## 2024-03-22 NOTE — Progress Notes (Signed)
 Echocardiogram 2D Echocardiogram has been performed.  [name] 03/22/2024, 5:28 PM

## 2024-03-22 NOTE — Progress Notes (Signed)
 Per patient's request, PCP appointment made with Bernita Ned, PA-C on February 17th, 2026 at 1:10pm.

## 2024-03-22 NOTE — Evaluation (Signed)
 Occupational Therapy Evaluation and Discharge Patient Details Name: [name] MRN: [id number] DOB: 12/20/1955 Today's Date: 03/22/2024   History of Present Illness   69 y.o. male presents to [facility] 03/21/24 with respiratory distress in setting of HTN urgency. Admitted with sepsis 2/2 PNA. PMHx:  HFpEF, PAF on Xarelto , HTN, HLD, gout, CVA, tobacco abuse, osteoarthritis, medication noncompliance     Clinical Impressions Pt is at baseline Ind-Mod I level of function with ADLs and ADL mobility. PTA pt lives with daughter, son in law and 3 grandchildren and reports that he is Ind with ADLs/selfcare, light meal prep, drives, no AD for mobility. All education completed and no further acute OT services are indicated at this time. Thank you for this referral, OT will sign off     If plan is discharge home, recommend the following:   Assistance with cooking/housework;Assist for transportation     Functional Status Assessment   Patient has not had a recent decline in their functional status     Equipment Recommendations   None recommended by OT     Recommendations for Other Services         Precautions/Restrictions   Precautions Precautions: Fall Recall of Precautions/Restrictions: Intact Restrictions Weight Bearing Restrictions Per Provider Order: No     Mobility Bed Mobility Overal bed mobility: Modified Independent             General bed mobility comments: HOB elevated    Transfers Overall transfer level: Modified independent Equipment used: None                      Balance Overall balance assessment: Mild deficits observed, not formally tested                                         ADL either performed or assessed with clinical judgement   ADL Overall ADL's : Independent;At baseline                                             Vision Ability to See in Adequate Light: 0 Adequate Patient Visual Report:  No change from baseline       Perception         Praxis         Pertinent Vitals/Pain Pain Assessment Pain Assessment: No/denies pain     Extremity/Trunk Assessment Upper Extremity Assessment Upper Extremity Assessment: Overall WFL for tasks assessed   Lower Extremity Assessment Lower Extremity Assessment: Defer to PT evaluation   Cervical / Trunk Assessment Cervical / Trunk Assessment: Normal   Communication Communication Communication: Impaired Factors Affecting Communication: Reduced clarity of speech   Cognition Arousal: Alert Behavior During Therapy: WFL for tasks assessed/performed                                 Following commands: Intact       Cueing  General Comments   Cueing Techniques: Verbal cues      Exercises     Shoulder Instructions      Home Living Family/patient expects to be discharged to:: Private residence Living Arrangements: Children Available Help at Discharge: Family;Available PRN/intermittently;Neighbor Type of Home: House Home Access: Stairs to enter Entrance  Stairs-Number of Steps: 3 Entrance Stairs-Rails: None Home Layout: One level     Bathroom Shower/Tub: Chief Strategy Officer: Standard     Home Equipment: None          Prior Functioning/Environment Prior Level of Function : Independent/Modified Independent;Driving             Mobility Comments: Ind with no AD ADLs Comments: Ind with ADLs/selfcare, light meal prep, drives    OT Problem List: Decreased activity tolerance   OT Treatment/Interventions:        OT Goals(Current goals can be found in the care plan section)   Acute Rehab OT Goals Patient Stated Goal: go home OT Goal Formulation: With patient   OT Frequency:       Co-evaluation              AM-PAC OT 6 Clicks Daily Activity     Outcome Measure Help from another person eating meals?: None Help from another person taking care of personal  grooming?: None Help from another person toileting, which includes using toliet, bedpan, or urinal?: None Help from another person bathing (including washing, rinsing, drying)?: None Help from another person to put on and taking off regular upper body clothing?: None Help from another person to put on and taking off regular lower body clothing?: None 6 Click Score: 24   End of Session Nurse Communication: Mobility status  Activity Tolerance: Patient tolerated treatment well Patient left: in bed  OT Visit Diagnosis: Muscle weakness (generalized) (M62.81);Other abnormalities of gait and mobility (R26.89)                Time: 8970-8952 OT Time Calculation (min): 18 min Charges:  OT General Charges $OT Visit: 1 Visit OT Evaluation $OT Eval Low Complexity: 1 Low    Jacques Karna Loose 03/22/2024, 1:56 PM

## 2024-03-22 NOTE — Progress Notes (Signed)
 Mobility Specialist Progress Note;   03/22/24 1335  Mobility  Activity Ambulated with assistance  Level of Assistance Contact guard assist, steadying assist  Assistive Device None  Distance Ambulated (ft) 350 ft  Activity Response Tolerated well  Mobility Referral Yes  Mobility visit 1 Mobility  Mobility Specialist Start Time (ACUTE ONLY) 1335  Mobility Specialist Stop Time (ACUTE ONLY) 1346  Mobility Specialist Time Calculation (min) (ACUTE ONLY) 11 min   Pt received in bed agreeable to mobility. No physical assistance required, CGA for safety. Pt returned to bed with call bell and personal belongings in reach. All needs met.   Ricky Janeal MATSU, BS Mobility Specialist Please contact via Special Educational Needs Teacher or [employer] [phone]

## 2024-03-22 NOTE — Evaluation (Signed)
 Physical Therapy Evaluation Patient Details Name: [name] MRN: [id number] DOB: Sep 16, 1955 Today's Date: 03/22/2024  History of Present Illness  69 y.o. male presents to [facility] 03/21/24 with respiratory distress in setting of HTN urgency. Admitted with sepsis 2/2 PNA. PMHx:  HFpEF, PAF on Xarelto , HTN, HLD, gout, CVA, tobacco abuse, osteoarthritis, medication noncompliance   Clinical Impression  PTA pt lived with his daughter and son-in-law in a house with 3 steps to enter. Pt was independent for mobility with no AD. Pt presents with baseline L sided weakness and slightly impaired balance. Pt reported being at baseline for mobility being ModI for transfers and ModI/supervision for gait with no AD. Pt would occasionally drift right/left, however, no overt LOB. Pt reported having slight L inattention from prior CVA which he compensates by frequently turning head to the left when ambulating and driving. Pt declines need for further acute or post-acute PT services. Recommend continued mobility with mobility specialist while admitted to the hospital. Acute PT signing off.         If plan is discharge home, recommend the following: Assist for transportation;Help with stairs or ramp for entrance   Can travel by private vehicle    Yes    Equipment Recommendations None recommended by PT     Functional Status Assessment Patient has not had a recent decline in their functional status     Precautions / Restrictions Precautions Precautions: Fall Recall of Precautions/Restrictions: Intact Restrictions Weight Bearing Restrictions Per Provider Order: No      Mobility  Bed Mobility Overal bed mobility: Modified Independent    General bed mobility comments: HOB elevated    Transfers Overall transfer level: Modified independent Equipment used: None     Ambulation/Gait Ambulation/Gait assistance: Modified independent (Device/Increase time), Supervision Gait Distance (Feet): 300  Feet Assistive device: None Gait Pattern/deviations: Step-through pattern, Decreased stride length, Drifts right/left Gait velocity: slightly decr     General Gait Details: No arm swing in LUE with arm positioned in shoulder IR and extension. Would occasionally drift when dual tasking, however, no overt LOB. Reported having slight L inattention at baseline that pt accomodates by frequently looking to the left    Balance Overall balance assessment: Mild deficits observed, not formally tested        Pertinent Vitals/Pain Pain Assessment Pain Assessment: No/denies pain    Home Living Family/patient expects to be discharged to:: Private residence Living Arrangements: Children (daughter and son in law) Available Help at Discharge: Family;Available PRN/intermittently;Neighbor (normally 24/7 assist) Type of Home: House Home Access: Stairs to enter Entrance Stairs-Rails: None Entrance Stairs-Number of Steps: 3   Home Layout: One level Home Equipment: None      Prior Function Prior Level of Function : Independent/Modified Independent;Driving    Mobility Comments: Ind with no AD ADLs Comments: Ind     Extremity/Trunk Assessment   Upper Extremity Assessment Upper Extremity Assessment: Defer to OT evaluation (residual LUE weakness)    Lower Extremity Assessment Lower Extremity Assessment: LLE deficits/detail LLE Deficits / Details: residual LLE weakness from prior cva. L foot positioned in slight supination. Grossly 4+/5    Cervical / Trunk Assessment Cervical / Trunk Assessment: Normal  Communication   Communication Communication: Impaired Factors Affecting Communication: Reduced clarity of speech    Cognition Arousal: Alert Behavior During Therapy: WFL for tasks assessed/performed   PT - Cognitive impairments: No apparent impairments    Following commands: Intact       Cueing Cueing Techniques: Verbal cues  General Comments General comments (skin integrity,  edema, etc.): VSS on RA     PT Assessment Patient does not need any further PT services         PT Goals (Current goals can be found in the Care Plan section)  Acute Rehab PT Goals PT Goal Formulation: All assessment and education complete, DC therapy     AM-PAC PT 6 Clicks Mobility  Outcome Measure Help needed turning from your back to your side while in a flat bed without using bedrails?: None Help needed moving from lying on your back to sitting on the side of a flat bed without using bedrails?: None Help needed moving to and from a bed to a chair (including a wheelchair)?: None Help needed standing up from a chair using your arms (e.g., wheelchair or bedside chair)?: None Help needed to walk in hospital room?: None Help needed climbing 3-5 steps with a railing? : A Little 6 Click Score: 23    End of Session   Activity Tolerance: Patient tolerated treatment well Patient left: in bed;with call bell/phone within reach Nurse Communication: Mobility status PT Visit Diagnosis: Other abnormalities of gait and mobility (R26.89);Muscle weakness (generalized) (M62.81)    Time: 9199-9182 PT Time Calculation (min) (ACUTE ONLY): 17 min   Charges:   PT Evaluation $PT Eval Low Complexity: 1 Low   PT General Charges $$ ACUTE PT VISIT: 1 Visit       Kate ORN, PT, DPT Secure Chat Preferred  Rehab Office [phone]   Kate BRAVO Wendolyn 03/22/2024, 8:52 AM

## 2024-03-22 NOTE — TOC Initial Note (Signed)
 Transition of Care [facility]) - Initial/Assessment Note    Patient Details  Name: [name] MRN: [id number] Date of Birth: March 10, 1955  Transition of Care [facility] CM/SW Contact:    Sudie Erminio Deems, RN Phone Number: 03/22/2024, 4:20 PM  Clinical Narrative: Patient presented for shortness of breath. PTA patient was from home with family support. Patient has PCP and an appointment has been scheduled. Information is on the AVS. Patient had concerns with affording Xarelto . Patient states he has not utilized the 30 day free Eliquis  card before. ICM did ask unit pharmacist for assistance. Patient is aware to see if he is eligible for the low income subsidiary program.-website and number provided to the patient. Patient to call to see if eligible. [email or web address]. ICM will continue to follow for additional needs as the patient progresses.               Expected Discharge Plan: Home/Self Care Barriers to Discharge: Continued Medical Work up   Patient Goals and CMS Choice Patient states their goals for this hospitalization and ongoing recovery are:: plan to return home          Expected Discharge Plan and Services In-house Referral: NA Discharge Planning Services: CM Consult, Follow-up appt scheduled   Living arrangements for the past 2 months: Single Family Home                   DME Agency: NA                  Prior Living Arrangements/Services Living arrangements for the past 2 months: Single Family Home Lives with:: Self Patient language and need for interpreter reviewed:: Yes Do you feel safe going back to the place where you live?: Yes      Need for Family Participation in Patient Care: No (Comment) Care giver support system in place?: No (comment)   Criminal Activity/Legal Involvement Pertinent to Current Situation/Hospitalization: No - Comment as needed  Activities of Daily Living   ADL Screening (condition at time of  admission) Independently performs ADLs?: Yes (appropriate for developmental age) Is the patient deaf or have difficulty hearing?: No Does the patient have difficulty seeing, even when wearing glasses/contacts?: No Does the patient have difficulty concentrating, remembering, or making decisions?: No  Permission Sought/Granted Permission sought to share information with : Family Supports, Case Manager                Emotional Assessment Appearance:: Appears stated age       Alcohol  / Substance Use: Not Applicable Psych Involvement: No (comment)  Admission diagnosis:  Hypertensive crisis [I16.9] Respiratory distress [R06.03] Acute respiratory failure with hypoxia and hypercapnia (HCC) [J96.01, J96.02] Patient Active Problem List   Diagnosis Date Noted   Acute respiratory failure with hypoxia and hypercapnia (HCC) 03/21/2024   Acute on chronic diastolic CHF (congestive heart failure) (HCC) 03/21/2024   Paroxysmal atrial fibrillation with RVR (HCC) 03/21/2024   Sepsis due to pneumonia (HCC) 03/21/2024   Hypertensive urgency 05/26/2021   Anxiety 05/26/2021   Acute respiratory failure with hypoxia (HCC) 05/26/2021   Wide-complex tachycardia    Respiratory distress    Acute pulmonary edema (HCC) 05/12/2021   Malnutrition of moderate degree 05/12/2021   Protrusion of lumbar intervertebral disc 12/10/2017   Chronic left-sided low back pain with left-sided sciatica 11/22/2017   Adhesive capsulitis of left shoulder 08/02/2017   Pain in left leg 08/02/2017   Gout 05/12/2017   Chronic left shoulder pain 06/13/2015   Neuropathic  pain of shoulder 06/13/2015   Acromioclavicular joint arthritis 03/26/2015   PAF (paroxysmal atrial fibrillation) (HCC) 03/20/2015   Leukocytosis 03/20/2015   Hematemesis 03/19/2015   Depression due to old stroke 02/26/2015   Hemiparesis and alteration of sensations as late effects of stroke (HCC) 10/29/2014   Hypersomnia with sleep apnea 10/29/2014    Nicotine  use disorder 10/29/2014   Left anterior shoulder pain 10/23/2014   Atrial fibrillation (HCC) 09/06/2014   Left spastic hemiparesis (HCC) 08/07/2014   Left-sided neglect 08/07/2014   History of stroke 08/01/2014   Tobacco dependence    Carotid artery occlusion with infarction [facility])    History of ETOH abuse    Dyslipidemia    Essential hypertension, benign 10/11/2013   PCP:  Juliane Che, PA Pharmacy:   [facility] [address], [address] [place] [address] Phone: [phone] Fax: [phone]  [facility] - [place], [place] - [address] [place] [address] Phone: [phone] Fax: [phone]     Social Drivers of Health (SDOH) Social History: SDOH Screenings   Food Insecurity: No Food Insecurity (03/22/2024)  Housing: Low Risk (03/22/2024)  Transportation Needs: No Transportation Needs (03/22/2024)  Utilities: Not At Risk (03/22/2024)  Alcohol  Screen: Low Risk (05/27/2021)  Financial Resource Strain: Low Risk (03/22/2024)  Physical Activity: Insufficiently Active (10/05/2022)   Received from [facility]  Social Connections: Socially Isolated (03/22/2024)  Stress: No Stress Concern Present (10/05/2022)   Received from [facility]  Tobacco Use: High Risk (03/21/2024)   SDOH Interventions: Financial Strain Interventions: Inpatient TOC, Other (Comment) (Reached out to inpatient pharamcy for financial assistance) Social Connections Interventions: Intervention Not Indicated, Inpatient TOC (Patient states he has friends/family that visit him often)   Readmission Risk Interventions     No data to display

## 2024-03-22 NOTE — Progress Notes (Signed)
 SDOH addressed and interventions provided in AVS for medication assistance.

## 2024-03-22 NOTE — Progress Notes (Addendum)
 " PROGRESS NOTE    [name]  FMW:[id number] DOB: Aug 02, 1955 DOA: 03/21/2024 PCP: Rolan Ezra RAMAN, MD   Chief Complaint  Patient presents with   Respiratory Distress    Brief Narrative:  Pt is a 69 y/o male with a past medical history of diastolic CHF, afib on Xarelto , hypertension, hyperlipidemia, CVA (2016), long term tobacco and marijuana use, osteoarthritis, and gout. Pt states that ran out of his home medications a few days ago and was feeling fine other than some mild shortness of breath. He awoke around 0200 to urinate and had sudden onset dyspnea. When EMS arrived, he was diaphoretic and in a tripod position with frothy white sputum. He was hypertensive and received several doses of SL nitrogen from EMS en route.   Assessment & Plan:   Principal Problem:   Acute respiratory failure with hypoxia and hypercapnia (HCC) Active Problems:   Dyslipidemia   Hypertensive urgency   Acute on chronic diastolic CHF (congestive heart failure) (HCC)   Paroxysmal atrial fibrillation with RVR (HCC)   Sepsis due to pneumonia (HCC)   Acute respiratory failure with hypoxia and hypercapnia- resolved Acute on chronic diastolic congestive heart failure -Vitals on arrival: SpO2 was in the 70s, BP was 197/104, HR was 116, RR was 35. SpO2 100 on BiPAP.  -Labs on admission:   >VBG: pH 7.128, pCO2 of 63.5, HCO3 of 21   >CBC: WBC 16.1  >Cardiac: BNP 516.0, serial troponins 53 >> 82>>67 -Most recent labs:  >VBG: pH 7.486, pCO2 of 34.5, HCO3 of 26  >CBC: WBC 8.5 -continue to monitor SpO2 -echo ordered, scheduled for 1500 on 03/22/24  >most recent echo (2024) LVEF of 55-60%, moderate mitral regurgitation  -euvolemic on admission and currently  -IV lasix  discontinued 03/21/24, resume PO lasix  03/22/24  -continue daily Entresto  -trend potassium and kidney function on BMP -strict I/Os -- net -582.2 (03/21/24) -daily weights and telemetry  -discontinue IV Rocephin , rapid drop in WBC and no infectious  symptoms, likely not pneumonia   Hypertensive urgency Primary hypertension -continue Entresto  as prescribed -BP was 197/104 on admission >> resolved to 106/68  Paroxysmal atrial fibrillation  -pt in NSR -continue Xarelto  -continuous cardiac monitoring  Hyperlipidemia -continue rosuvastatin  40mg  daily  Tobacco abuse -smokes 1-2.5 packs of cigarettes daily since ~69yrs old -PRN nicotine  patch  -counseled about health risks and asked about his desire to quit  Marijuana use -pt smokes multiple joints a day  -counseled about health risks and asked about his desire to quit    DVT prophylaxis: Anticoagulated on Xarelto   Code Status: Full Family Communication: No family at bedside Disposition:   Status is: Inpatient Remains inpatient appropriate because: overnight observation   Consultants:  None   Antimicrobials:  Rocephin  2g IV daily >> start 03/21/24 >> end 03/22/24   Subjective: Pt says that he is relieved to be feeling much better today and is wanting to get up and walk around more. He denies HA, chest pain, palpitations, orthopnea, PND, shortness of breath at rest and with exertion, abdominal distention, or swelling in his legs. He is requesting the catheter be removed so he can use the restroom on his own. He is also experiencing nicotine  cravings and would like a nicotine  patch.   Objective: Vitals:   03/22/24 0018 03/22/24 0500 03/22/24 0738 03/22/24 0847  BP: (!) 113/54 115/72 106/68   Pulse:  80 81 77  Resp: 18 18 16 17   Temp: 98.3 F (36.8 C) 97.9 F (36.6 C)  97.9  F (36.6 C)  TempSrc: Oral Oral  Oral  SpO2:  96% 99% 97%  Weight:      Height:       No intake or output data in the 24 hours ending 03/22/24 1045 Filed Weights   03/21/24 0230 03/21/24 1736  Weight: 65.1 kg 66.1 kg    Examination:  General exam: Appears calm and comfortable. Respiratory system: Clear to auscultation. Respiratory effort normal. Moving air well.  Cardiovascular system:  S1 & S2 heard, RRR. No JVD. No pedal edema. Gastrointestinal system: Abdomen is nondistended, soft and nontender. No masses palpated.  Central nervous system: Alert and oriented. No focal neurological deficits. Extremities: Symmetric 5 x 5 power. Skin: Bruising on L forearm from ambulance transport, pt was fighting CPAP mask. Psychiatry: Judgement and insight appear normal. Mood & affect appropriate.     Data Reviewed: I have personally reviewed following labs and imaging studies  CBC: Recent Labs  Lab 03/21/24 0230 03/21/24 0236 03/21/24 0348 03/21/24 0445 03/21/24 1242 03/21/24 2140 03/22/24 0534  WBC 16.1*  --   --  20.4*  --  12.9* 8.5  NEUTROABS 6.4  --   --   --   --   --   --   HGB 16.4   < > 16.0 15.4 14.3 14.6 14.9  HCT 49.9   < > 47.0 47.2 42.0 42.4 44.8  MCV 96.7  --   --  95.9  --  90.8 92.2  PLT 331  --   --  291  --  281 275   < > = values in this interval not displayed.    Basic Metabolic Panel: Recent Labs  Lab 03/21/24 0230 03/21/24 0236 03/21/24 0348 03/21/24 0445 03/21/24 1242 03/22/24 0534  NA 140 132* 140 138 139 138  K 3.7 3.3* 3.7 3.8 4.4 3.8  CL 103  --   --  103  --  102  CO2 19*  --   --  19*  --  27  GLUCOSE 284*  --   --  172*  --  99  BUN 12  --   --  13  --  14  CREATININE 1.25*  --   --  1.12  --  0.93  CALCIUM  9.3  --   --  8.9  --  9.3  MG 2.0  --   --   --   --   --     GFR: Estimated Creatinine Clearance: 69.6 mL/min (by C-G formula based on SCr of 0.93 mg/dL).  Liver Function Tests: Recent Labs  Lab 03/21/24 0230  AST 31  ALT 19  ALKPHOS 110  BILITOT 0.4  PROT 7.8  ALBUMIN 3.9    CBG: No results for input(s): GLUCAP in the last 168 hours.   Recent Results (from the past 240 hours)  Resp panel by RT-PCR (RSV, Flu A&B, Covid) Anterior Nasal Swab     Status: None   Collection Time: 03/21/24  2:30 AM   Specimen: Anterior Nasal Swab  Result Value Ref Range Status   SARS Coronavirus 2 by RT PCR NEGATIVE NEGATIVE  Final   Influenza A by PCR NEGATIVE NEGATIVE Final   Influenza B by PCR NEGATIVE NEGATIVE Final    Comment: (NOTE) The Xpert Xpress SARS-CoV-2/FLU/RSV plus assay is intended as an aid in the diagnosis of influenza from Nasopharyngeal swab specimens and should not be used as a sole basis for treatment. Nasal washings and aspirates are unacceptable for Xpert Xpress  SARS-CoV-2/FLU/RSV testing.  Fact Sheet for Patients: bloggercourse.com  Fact Sheet for Healthcare Providers: seriousbroker.it  This test is not yet approved or cleared by the [place]  FDA and has been authorized for detection and/or diagnosis of SARS-CoV-2 by FDA under an Emergency Use Authorization (EUA). This EUA will remain in effect (meaning this test can be used) for the duration of the COVID-19 declaration under Section 564(b)(1) of the Act, 21 U.S.C. section 360bbb-3(b)(1), unless the authorization is terminated or revoked.     Resp Syncytial Virus by PCR NEGATIVE NEGATIVE Final    Comment: (NOTE) Fact Sheet for Patients: bloggercourse.com  Fact Sheet for Healthcare Providers: seriousbroker.it  This test is not yet approved or cleared by the [place]  FDA and has been authorized for detection and/or diagnosis of SARS-CoV-2 by FDA under an Emergency Use Authorization (EUA). This EUA will remain in effect (meaning this test can be used) for the duration of the COVID-19 declaration under Section 564(b)(1) of the Act, 21 U.S.C. section 360bbb-3(b)(1), unless the authorization is terminated or revoked.  Performed at [facility], [address] [place], [place] [address]   Blood Culture (routine x 2)     Status: None (Preliminary result)   Collection Time: 03/21/24  3:50 AM   Specimen: BLOOD RIGHT ARM  Result Value Ref Range Status   Specimen Description BLOOD RIGHT ARM  Final   Special  Requests   Final    BOTTLES DRAWN AEROBIC AND ANAEROBIC Blood Culture adequate volume   Culture   Final    NO GROWTH 1 DAY Performed at [facility], [address] [place], [place] [address]    Report Status PENDING  Incomplete  Blood Culture (routine x 2)     Status: None (Preliminary result)   Collection Time: 03/21/24  3:50 AM   Specimen: BLOOD RIGHT HAND  Result Value Ref Range Status   Specimen Description BLOOD RIGHT HAND  Final   Special Requests   Final    BOTTLES DRAWN AEROBIC ONLY Blood Culture adequate volume   Culture   Final    NO GROWTH 1 DAY Performed at [facility], [address], [place], [place] [address]    Report Status PENDING  Incomplete         Radiology Studies: DG CHEST PORT 1 VIEW Result Date: 03/22/2024 CLINICAL DATA:  Pneumonia.  Right pleural effusion. EXAM: PORTABLE CHEST 1 VIEW COMPARISON:  03/21/2024 FINDINGS: Marked interval improvement in airspace disease seen previously in the right lung, essentially resolved. There is some minimal atelectasis or infiltrate at the left base on the current study. The cardiopericardial silhouette is within normal limits for size. No acute bony abnormality. IMPRESSION: 1. Marked interval improvement in right lung airspace disease, essentially resolved. 2. Minimal atelectasis or infiltrate at the left base. Electronically Signed   By: Camellia Candle M.D.   On: 03/22/2024 08:23   DG Chest Port 1 View Result Date: 03/21/2024 EXAM: 1 VIEW(S) XRAY OF THE CHEST 03/21/2024 02:53:22 AM COMPARISON: Chest x-ray 05/27/2021. CLINICAL HISTORY: Dyspnea. FINDINGS: LUNGS AND PLEURA: Asymmetric interstitial and hazy airspace disease in right lung. Small layering right pleural effusion. Nodular density left lower lobe consistent with nipple shadow. No pneumothorax. HEART AND MEDIASTINUM: No acute abnormality of the cardiac and mediastinal silhouettes. BONES AND SOFT TISSUES: Osteopenia. Moderate thoracic spondylosis.  IMPRESSION: 1. Asymmetric interstitial and hazy airspace disease in the right lung with small layering right pleural effusion. 2. Nodular density in the left  lower lobe consistent with nipple shadow. 3. Osteopenia and moderate thoracic spondylosis. Electronically signed by: Greig Pique MD 03/21/2024 02:57 AM EST RP Workstation: [id number]        Scheduled Meds:  azithromycin   500 mg Oral Daily   furosemide   60 mg Oral Daily   guaiFENesin   600 mg Oral BID   ipratropium-albuterol   3 mL Nebulization QID   potassium chloride  SA  20 mEq Oral Daily   rivaroxaban   20 mg Oral Q supper   rosuvastatin   40 mg Oral QHS   sacubitril -valsartan   1 tablet Oral BID   Continuous Infusions:  cefTRIAXone  (ROCEPHIN )  IV 2 g (03/22/24 0125)   nitroGLYCERIN  Stopped (03/21/24 0400)     LOS: 1 day     Shay Jhaveri, PA-S [employer]    To contact the attending provider between 7A-7P or the covering provider during after hours 7P-7A, please log into the web site [email or web address] and access using universal [place] password for that web site. If you do not have the password, please call the hospital operator.  03/22/2024, 10:45 AM   "

## 2024-03-22 NOTE — Progress Notes (Signed)
 Heart Failure Navigator Progress Note  Assessed for Heart & Vascular TOC clinic readiness.  Patient does not meet criteria due to he is an Advanced Heart Failure Team patient of Dr. Rolan. .   Navigator will sign off at this time.   Stephane Haddock, BSN, Scientist, clinical (histocompatibility and immunogenetics) Only

## 2024-03-23 ENCOUNTER — Other Ambulatory Visit (HOSPITAL_COMMUNITY): Payer: Self-pay

## 2024-03-23 DIAGNOSIS — Z8673 Personal history of transient ischemic attack (TIA), and cerebral infarction without residual deficits: Secondary | ICD-10-CM

## 2024-03-23 DIAGNOSIS — Z720 Tobacco use: Secondary | ICD-10-CM

## 2024-03-23 LAB — ECHOCARDIOGRAM COMPLETE
AR max vel: 3.07 cm2
AV Area VTI: 3.62 cm2
AV Area mean vel: 2.86 cm2
AV Mean grad: 3 mmHg
AV Peak grad: 6.6 mmHg
Ao pk vel: 1.28 m/s
Area-P 1/2: 2.73 cm2
Calc EF: 51.1 %
Height: 66 in
S' Lateral: 3.1 cm
Single Plane A2C EF: 52.6 %
Single Plane A4C EF: 49.8 %
Weight: 2331.58 [oz_av]

## 2024-03-23 LAB — BASIC METABOLIC PANEL WITH GFR
Anion gap: 11 (ref 5–15)
BUN: 16 mg/dL (ref 8–23)
CO2: 26 mmol/L (ref 22–32)
Calcium: 10.1 mg/dL (ref 8.9–10.3)
Chloride: 101 mmol/L (ref 98–111)
Creatinine, Ser: 1.11 mg/dL (ref 0.61–1.24)
GFR, Estimated: >60 mL/min
Glucose, Bld: 94 mg/dL (ref 70–99)
Potassium: 4.5 mmol/L (ref 3.5–5.1)
Sodium: 137 mmol/L (ref 135–145)

## 2024-03-23 LAB — MAGNESIUM: Magnesium: 2 mg/dL (ref 1.7–2.4)

## 2024-03-23 MED ORDER — SPIRONOLACTONE 25 MG PO TABS
12.5000 mg | ORAL_TABLET | Freq: Every day | ORAL | 0 refills | Status: AC
  Filled 2024-03-23: qty 15, 30d supply, fill #0

## 2024-03-23 MED ORDER — APIXABAN 5 MG PO TABS: 5.0000 mg | ORAL_TABLET | Freq: Two times a day (BID) | ORAL | Status: DC

## 2024-03-23 MED ORDER — SACUBITRIL-VALSARTAN 24-26 MG PO TABS
1.0000 | ORAL_TABLET | Freq: Two times a day (BID) | ORAL | 0 refills | Status: AC
  Filled 2024-03-23: qty 60, 30d supply, fill #0

## 2024-03-23 MED ORDER — EMPAGLIFLOZIN 10 MG PO TABS
10.0000 mg | ORAL_TABLET | Freq: Every day | ORAL | 0 refills | Status: AC
  Filled 2024-03-23: qty 30, 30d supply, fill #0

## 2024-03-23 MED ORDER — ROSUVASTATIN CALCIUM 40 MG PO TABS
40.0000 mg | ORAL_TABLET | Freq: Every day | ORAL | 0 refills | Status: AC
  Filled 2024-03-23: qty 30, 30d supply, fill #0

## 2024-03-23 MED ORDER — RIVAROXABAN 20 MG PO TABS
20.0000 mg | ORAL_TABLET | Freq: Every day | ORAL | 0 refills | Status: DC
  Filled 2024-03-23: qty 30, 30d supply, fill #0

## 2024-03-23 MED ORDER — APIXABAN 5 MG PO TABS
5.0000 mg | ORAL_TABLET | Freq: Two times a day (BID) | ORAL | 0 refills | Status: DC
  Filled 2024-03-23: qty 60, 30d supply, fill #0

## 2024-03-23 MED ORDER — NICOTINE 21 MG/24HR TD PT24
21.0000 mg | MEDICATED_PATCH | Freq: Every day | TRANSDERMAL | 0 refills | Status: AC | PRN
  Filled 2024-03-23: qty 28, 28d supply, fill #0

## 2024-03-23 MED ORDER — IPRATROPIUM-ALBUTEROL 0.5-2.5 (3) MG/3ML IN SOLN: 3.0000 mL | Freq: Two times a day (BID) | RESPIRATORY_TRACT | Status: DC

## 2024-03-23 MED ORDER — APIXABAN 5 MG PO TABS
5.0000 mg | ORAL_TABLET | Freq: Two times a day (BID) | ORAL | 0 refills | Status: AC
  Filled 2024-03-23: qty 60, 30d supply, fill #0

## 2024-03-23 MED ORDER — FUROSEMIDE 20 MG PO TABS
20.0000 mg | ORAL_TABLET | Freq: Every day | ORAL | 0 refills | Status: AC | PRN
  Filled 2024-03-23: qty 30, 30d supply, fill #0

## 2024-03-23 NOTE — Hospital Course (Signed)
 [name] was admitted to the hospital with the working diagnosis of heart failure exacerbation.   69 yo male with the past medical history of heart failure, paroxysmal atrial fibrillation, hypertension, hyperlipidemia, gout and CVA who presented with worsening dyspnea. Acute onset of symptoms, rapidly developed respiratory distress. Apparently he was not adherent to his medical therapy. EMS was called and he was found with increased work of breathing, and hypoxemic at 70%. He was placed on Cpap and was transported to the ED. On his initial physical examination his blood pressure was 197/104, HR 116, RR 35 and 02 saturation 100% on Bipap. Lungs had decreased breath sound and bilateral rales, heart with S1 and S2 present and regular, with no gallops or rubs, abdomen with no distention and no lower extremity edema.   VBG 7,12/ 63/ 82/ 23/ 21/ 91%  Na 140, K 3.7 Cl 103 bicarbonate 19, glucose 284 bun 12 cr 1.25  Pro BNP 516  High sensitive troponin 6 and 53  Wbc 16.1 hgb 16.4 plt 331  Sars covid 19 negative Influenza negative  RSV negative  Urinary antigen pneumococcal negative  Urinary legionella antigen negative   Chest radiograph with right rotation, bilateral hilar vascular congestion, bilateral central interstitial infiltrates with cephalization of the vasculature   Patient was placed on IV furosemide  with improvement in his symptoms.   02/05 improved volume status, plan to continue guideline directed medical therapy

## 2024-03-23 NOTE — Discharge Summary (Addendum)
 " Physician Discharge Summary   Patient: [name] MRN: [id number] DOB: Aug 18, 1955  Admit date:     03/21/2024  Discharge date: 03/23/24  Discharge Physician: [name]   PCP: [name] Che, [name]   Recommendations at discharge:    Patient has been placed on guideline directed medical therapy with entresto , spironolactone  and SGLT 2 inh Take furosemide  as needed in case of volume overload, weight increase 2 to 3 lbs in 24 hrs or 5 lbs in 7 days Rivaroxaban  was changed to apixaban  due to cost.  Refilled all his medications and advised to stay adherent to his medical therapy Follow up renal function and electrolytes in 7 days as outpatient Follow up with [name] in 7 to 10 days Follow up with Cardiology as scheduled   Discharge Diagnoses: Principal Problem:   Acute respiratory failure with hypoxia and hypercapnia (HCC) Active Problems:   Acute on chronic diastolic CHF (congestive heart failure) (HCC)   Sepsis due to pneumonia [facility])   Hypertensive urgency   Dyslipidemia   Paroxysmal atrial fibrillation with RVR (HCC)   Hypertensive crisis  Resolved Problems:   * No resolved hospital problems. [facility] Course: [name] was admitted to the hospital with the working diagnosis of heart failure exacerbation.   69 yo male with the past medical history of heart failure, paroxysmal atrial fibrillation, hypertension, hyperlipidemia, gout and CVA who presented with worsening dyspnea. Acute onset of symptoms, rapidly developed respiratory distress. Apparently he was not adherent to his medical therapy. EMS was called and he was found with increased work of breathing, and hypoxemic at 70%. He was placed on Cpap and was transported to the ED. On his initial physical examination his blood pressure was 197/104, HR 116, RR 35 and 02 saturation 100% on Bipap. Lungs had decreased breath sound and bilateral rales, heart with S1 and S2 present and regular, with no gallops or  rubs, abdomen with no distention and no lower extremity edema.   VBG 7,12/ 63/ 82/ 23/ 21/ 91%  Na 140, K 3.7 Cl 103 bicarbonate 19, glucose 284 bun 12 cr 1.25  Pro BNP 516  High sensitive troponin 6 and 53  Wbc 16.1 hgb 16.4 plt 331  Sars covid 19 negative Influenza negative  RSV negative  Urinary antigen pneumococcal negative  Urinary legionella antigen negative   Chest radiograph with right rotation, bilateral hilar vascular congestion, bilateral central interstitial infiltrates with cephalization of the vasculature   Patient was placed on IV furosemide  with improvement in his symptoms.   02/05 improved volume status, plan to continue guideline directed medical therapy   Assessment and Plan: * Acute on chronic diastolic CHF (congestive heart failure) (HCC) Echocardiogram with preserved LV systolic function EF 50 to 55%, grade I diastolic dysfunction with impaired relaxation, RV systolic function preserved, mild to moderate mitral valve regurgitation, no pericardial effusion. LA and RA with normal size.   Patient was placed on IV furosemide  for diuresis, negative fluid balance was achieved, with improvement in his symptoms   Plan to continue medical therapy with entresto , added spironolactone  and SGLT 2 inh. Consider adding B blocker as outpatient  Loop diuretic as needed. Follow up as outpatient  Acute hypoxemic and hypercapnic respiratory failure due to acute cardiogenic pulmonary edema Patient responded well to diuresis, at the time of discharge his 02 saturation is 96% on room air.  Sepsis and pneumonia ruled out, discontinue antibiotics Reactive leukocytosis improved, at the time of discharge wbc is 8.5  Hypertensive urgency Uncontrolled hypertension  Patient was placed on entresoto and had aggressive diuresis with improvement in blood pressure Plan to continue entresto  and spironolactone  Follow up as outpatient    Paroxysmal atrial fibrillation (HCC) Continue  anticoagulation with apixaban  Currently not on AV blocker   Dyslipidemia Continue statin therapy  History of CVA (cerebrovascular accident) Continue statin, blood pressure control and apixaban  for anticoagulation  Follow up as outpatient   Tobacco abuse Smoking cessation counseling Nicotine  patch        Consultants: none  Procedures performed: none   Disposition: Home Diet recommendation:  Cardiac diet DISCHARGE MEDICATION: Allergies as of 03/23/2024   No Known Allergies      Medication List     STOP taking these medications    potassium chloride  SA 20 MEQ tablet Commonly known as: KLOR-CON  M   Xarelto  20 MG Tabs tablet Generic drug: rivaroxaban        TAKE these medications    apixaban  5 MG Tabs tablet Commonly known as: ELIQUIS  Take 1 tablet (5 mg total) by mouth 2 (two) times daily.   empagliflozin  10 MG Tabs tablet Commonly known as: JARDIANCE  Take 1 tablet (10 mg total) by mouth daily. Start taking on: March 24, 2024   furosemide  20 MG tablet Commonly known as: LASIX  Take 1 tablet (20 mg total) by mouth daily as needed for fluid or edema (in case of weight gain 2 to 3 lbs in 24 hrs or 5 lbs in 7 days). What changed:  medication strength how much to take when to take this reasons to take this   nicotine  21 mg/24hr patch Commonly known as: NICODERM CQ  - dosed in mg/24 hours Place 1 patch (21 mg total) onto the skin daily as needed (nicotine  replacement).   rosuvastatin  40 MG tablet Commonly known as: CRESTOR  Take 1 tablet (40 mg total) by mouth at bedtime. NEEDS FOLLOW UP APPOINTMENT FOR MORE REFILLS   sacubitril -valsartan  24-26 MG Commonly known as: Entresto  Take 1 tablet by mouth 2 (two) times daily.   spironolactone  25 MG tablet Commonly known as: ALDACTONE  Take 0.5 tablets (12.5 mg total) by mouth daily. Start taking on: March 24, 2024        Follow-up Information     [name] Ezra RAMAN, MD Follow up.   Specialty:  Cardiology Contact information: [address] [place] [address] [phone]         [name], [place]. Go on 04/04/2024.   Specialty: Family Medicine Why: Hospital discharge follow-up appointment with [name] Juliane, [name]-C on February 17th, 2026 at 1:10pm.  Bring the discharge paperwork (AVS) and insurance card to the appointment Contact information: [address] [place] [address] [phone]                Discharge Exam: Fredricka Weights   03/21/24 0230 03/21/24 1736  Weight: 65.1 kg 66.1 kg   BP 115/69 (BP Location: Left Arm)   Pulse 65   Temp 97.8 F (36.6 C) (Oral)   Resp 16   Ht 5' 6 (1.676 m)   Wt 66.1 kg   SpO2 96%   BMI 23.52 kg/m   Patient with no chest pain and no dyspnea, no lower extremity edema, PND or orthopnea  Neurology awake and alert ENT with mild pallor Cardiovascular with S1 and S2 present and regular with no gallops or rubs, positive systolic murmur at the apex No JVD Respiratory with no rales or wheezing, no rhonchi Abdomen with no  distention  No lower extremity edema   Condition at discharge: stable  The results of significant diagnostics from this hospitalization (including imaging, microbiology, ancillary and laboratory) are listed below for reference.   Imaging Studies: DG CHEST PORT 1 VIEW Result Date: 03/22/2024 CLINICAL DATA:  Pneumonia.  Right pleural effusion. EXAM: PORTABLE CHEST 1 VIEW COMPARISON:  03/21/2024 FINDINGS: Marked interval improvement in airspace disease seen previously in the right lung, essentially resolved. There is some minimal atelectasis or infiltrate at the left base on the current study. The cardiopericardial silhouette is within normal limits for size. No acute bony abnormality. IMPRESSION: 1. Marked interval improvement in right lung airspace disease, essentially resolved. 2. Minimal atelectasis or infiltrate at the left base. Electronically Signed   By: Camellia Candle M.D.   On: 03/22/2024 08:23   DG Chest Port 1 View Result Date: 03/21/2024 EXAM: 1 VIEW(S) XRAY OF THE CHEST 03/21/2024 02:53:22 AM COMPARISON: Chest x-ray 05/27/2021. CLINICAL HISTORY: Dyspnea. FINDINGS: LUNGS AND PLEURA: Asymmetric interstitial and hazy airspace disease in right lung. Small layering right pleural effusion. Nodular density left lower lobe consistent with nipple shadow. No pneumothorax. HEART AND MEDIASTINUM: No acute abnormality of the cardiac and mediastinal silhouettes. BONES AND SOFT TISSUES: Osteopenia. Moderate thoracic spondylosis. IMPRESSION: 1. Asymmetric interstitial and hazy airspace disease in the right lung with small layering right pleural effusion. 2. Nodular density in the left lower lobe consistent with nipple shadow. 3. Osteopenia and moderate thoracic spondylosis. Electronically signed by: Greig Pique MD 03/21/2024 02:57 AM EST RP Workstation: [id number]    Microbiology: Results for orders placed or performed during the hospital encounter of 03/21/24  Resp panel by RT-PCR (RSV, Flu A&B, Covid) Anterior Nasal Swab     Status: None   Collection Time: 03/21/24  2:30 AM   Specimen: Anterior Nasal Swab  Result Value Ref Range Status   SARS Coronavirus 2 by RT PCR NEGATIVE NEGATIVE Final   Influenza A by PCR NEGATIVE NEGATIVE Final   Influenza B by PCR NEGATIVE NEGATIVE Final    Comment: (NOTE) The Xpert Xpress SARS-CoV-2/FLU/RSV plus assay is intended as an aid in the diagnosis of influenza from Nasopharyngeal swab specimens and should not be used as a sole basis for treatment. Nasal washings and aspirates are unacceptable for Xpert Xpress SARS-CoV-2/FLU/RSV testing.  Fact Sheet for Patients: bloggercourse.com  Fact Sheet for Healthcare Providers: seriousbroker.it  This test is not yet approved or cleared by the [place]  FDA and has been authorized for detection and/or diagnosis of SARS-CoV-2  by FDA under an Emergency Use Authorization (EUA). This EUA will remain in effect (meaning this test can be used) for the duration of the COVID-19 declaration under Section 564(b)(1) of the Act, 21 U.S.C. section 360bbb-3(b)(1), unless the authorization is terminated or revoked.     Resp Syncytial Virus by PCR NEGATIVE NEGATIVE Final    Comment: (NOTE) Fact Sheet for Patients: bloggercourse.com  Fact Sheet for Healthcare Providers: seriousbroker.it  This test is not yet approved or cleared by the [place]  FDA and has been authorized for detection and/or diagnosis of SARS-CoV-2 by FDA under an Emergency Use Authorization (EUA). This EUA will remain in effect (meaning this test can be used) for the duration of the COVID-19 declaration under Section 564(b)(1) of the Act, 21 U.S.C. section 360bbb-3(b)(1), unless the authorization is terminated or revoked.  Performed at [facility], [address] [place], [place] [address]   Blood Culture (routine x 2)     Status: None (  Preliminary result)   Collection Time: 03/21/24  3:50 AM   Specimen: BLOOD RIGHT ARM  Result Value Ref Range Status   Specimen Description BLOOD RIGHT ARM  Final   Special Requests   Final    BOTTLES DRAWN AEROBIC AND ANAEROBIC Blood Culture adequate volume   Culture   Final    NO GROWTH 2 DAYS Performed at [facility], [address] [place], [place] [address]    Report Status PENDING  Incomplete  Blood Culture (routine x 2)     Status: None (Preliminary result)   Collection Time: 03/21/24  3:50 AM   Specimen: BLOOD RIGHT HAND  Result Value Ref Range Status   Specimen Description BLOOD RIGHT HAND  Final   Special Requests   Final    BOTTLES DRAWN AEROBIC ONLY Blood Culture adequate volume   Culture   Final    NO GROWTH 2 DAYS Performed at [facility], [address] [place], [place] [address]    Report Status PENDING   Incomplete    Labs: CBC: Recent Labs  Lab 03/21/24 0230 03/21/24 0236 03/21/24 0348 03/21/24 0445 03/21/24 1242 03/21/24 2140 03/22/24 0534  WBC 16.1*  --   --  20.4*  --  12.9* 8.5  NEUTROABS 6.4  --   --   --   --   --   --   HGB 16.4   < > 16.0 15.4 14.3 14.6 14.9  HCT 49.9   < > 47.0 47.2 42.0 42.4 44.8  MCV 96.7  --   --  95.9  --  90.8 92.2  PLT 331  --   --  291  --  281 275   < > = values in this interval not displayed.   Basic Metabolic Panel: Recent Labs  Lab 03/21/24 0230 03/21/24 0236 03/21/24 0348 03/21/24 0445 03/21/24 1242 03/22/24 0534 03/23/24 0507  NA 140   < > 140 138 139 138 137  K 3.7   < > 3.7 3.8 4.4 3.8 4.5  CL 103  --   --  103  --  102 101  CO2 19*  --   --  19*  --  27 26  GLUCOSE 284*  --   --  172*  --  99 94  BUN 12  --   --  13  --  14 16  CREATININE 1.25*  --   --  1.12  --  0.93 1.11  CALCIUM  9.3  --   --  8.9  --  9.3 10.1  MG 2.0  --   --   --   --   --  2.0   < > = values in this interval not displayed.   Liver Function Tests: Recent Labs  Lab 03/21/24 0230  AST 31  ALT 19  ALKPHOS 110  BILITOT 0.4  PROT 7.8  ALBUMIN 3.9   CBG: No results for input(s): GLUCAP in the last 168 hours.  Discharge time spent: greater than 30 minutes.  Signed: Elidia Toribio Furnace, MD [facility] 03/23/2024 "

## 2024-03-23 NOTE — Plan of Care (Signed)

## 2024-03-23 NOTE — Assessment & Plan Note (Signed)
 Smoking cessation counseling. Nicotine patch

## 2024-03-23 NOTE — Assessment & Plan Note (Addendum)
 Continue statin, blood pressure control and apixaban  for anticoagulation  Follow up as outpatient

## 2024-03-24 LAB — CULTURE, BLOOD (ROUTINE X 2)
Culture  Setup Time: NO GROWTH
Culture: NO GROWTH
Special Requests: ADEQUATE
Special Requests: ADEQUATE

## 2024-03-24 LAB — LEGIONELLA PNEUMOPHILA SEROGP 1 UR AG: L. pneumophila Serogp 1 Ur Ag: NEGATIVE
